# Patient Record
Sex: Female | Born: 1950
Health system: Southern US, Community
[De-identification: ages and names within clinical notes are randomized; demographics above are authoritative.]

## PROBLEM LIST (undated history)

## (undated) DIAGNOSIS — C801 Malignant (primary) neoplasm, unspecified: Secondary | ICD-10-CM

## (undated) DIAGNOSIS — R519 Headache, unspecified: Secondary | ICD-10-CM

## (undated) DIAGNOSIS — IMO0001 Reserved for inherently not codable concepts without codable children: Secondary | ICD-10-CM

## (undated) DIAGNOSIS — C3490 Malignant neoplasm of unspecified part of unspecified bronchus or lung: Secondary | ICD-10-CM

## (undated) DIAGNOSIS — IMO0002 Reserved for concepts with insufficient information to code with codable children: Secondary | ICD-10-CM

## (undated) DIAGNOSIS — R51 Headache: Secondary | ICD-10-CM

## (undated) DIAGNOSIS — Z7189 Other specified counseling: Secondary | ICD-10-CM

## (undated) DIAGNOSIS — D500 Iron deficiency anemia secondary to blood loss (chronic): Secondary | ICD-10-CM

## (undated) DIAGNOSIS — K219 Gastro-esophageal reflux disease without esophagitis: Secondary | ICD-10-CM

## (undated) DIAGNOSIS — J910 Malignant pleural effusion: Secondary | ICD-10-CM

## (undated) DIAGNOSIS — I10 Essential (primary) hypertension: Secondary | ICD-10-CM

## (undated) DIAGNOSIS — E538 Deficiency of other specified B group vitamins: Secondary | ICD-10-CM

## (undated) DIAGNOSIS — I639 Cerebral infarction, unspecified: Secondary | ICD-10-CM

## (undated) DIAGNOSIS — K909 Intestinal malabsorption, unspecified: Secondary | ICD-10-CM

## (undated) DIAGNOSIS — E559 Vitamin D deficiency, unspecified: Secondary | ICD-10-CM

## (undated) HISTORY — DX: Malignant neoplasm of unspecified part of unspecified bronchus or lung: C34.90

## (undated) HISTORY — DX: Gastro-esophageal reflux disease without esophagitis: K21.9

## (undated) HISTORY — PX: MEDIASTINOSCOPY: SUR861

## (undated) HISTORY — PX: APPENDECTOMY: SHX54

## (undated) HISTORY — DX: Malignant pleural effusion: J91.0

## (undated) HISTORY — DX: Reserved for concepts with insufficient information to code with codable children: IMO0002

## (undated) HISTORY — PX: LUNG REMOVAL, PARTIAL: SHX233

## (undated) HISTORY — DX: Vitamin D deficiency, unspecified: E55.9

## (undated) HISTORY — DX: Intestinal malabsorption, unspecified: K90.9

## (undated) HISTORY — PX: BRONCHOSCOPY: SUR163

## (undated) HISTORY — DX: Other specified counseling: Z71.89

## (undated) HISTORY — DX: Deficiency of other specified B group vitamins: E53.8

## (undated) HISTORY — DX: Cerebral infarction, unspecified: I63.9

## (undated) HISTORY — PX: CHOLECYSTECTOMY: SHX55

## (undated) HISTORY — DX: Reserved for inherently not codable concepts without codable children: IMO0001

## (undated) HISTORY — DX: Iron deficiency anemia secondary to blood loss (chronic): D50.0

---
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
## 1981-02-07 HISTORY — PX: VAGINAL HYSTERECTOMY: SUR661

## 1999-02-06 ENCOUNTER — Emergency Department (HOSPITAL_COMMUNITY): Admission: EM | Admit: 1999-02-06 | Discharge: 1999-02-06 | Payer: Self-pay | Admitting: Emergency Medicine

## 1999-02-06 ENCOUNTER — Encounter: Payer: Self-pay | Admitting: Emergency Medicine

## 2000-12-09 ENCOUNTER — Ambulatory Visit (HOSPITAL_COMMUNITY): Admission: RE | Admit: 2000-12-09 | Discharge: 2000-12-09 | Payer: Self-pay | Admitting: *Deleted

## 2003-09-24 ENCOUNTER — Ambulatory Visit (HOSPITAL_COMMUNITY): Admission: RE | Admit: 2003-09-24 | Discharge: 2003-09-24 | Payer: Self-pay | Admitting: Specialist

## 2003-11-03 ENCOUNTER — Ambulatory Visit (HOSPITAL_COMMUNITY): Admission: RE | Admit: 2003-11-03 | Discharge: 2003-11-03 | Payer: Self-pay | Admitting: Cardiothoracic Surgery

## 2003-11-12 ENCOUNTER — Encounter (INDEPENDENT_AMBULATORY_CARE_PROVIDER_SITE_OTHER): Payer: Self-pay | Admitting: Specialist

## 2003-11-12 ENCOUNTER — Inpatient Hospital Stay (HOSPITAL_COMMUNITY): Admission: RE | Admit: 2003-11-12 | Discharge: 2003-11-18 | Payer: Self-pay | Admitting: Cardiothoracic Surgery

## 2003-12-05 ENCOUNTER — Ambulatory Visit (HOSPITAL_COMMUNITY): Admission: RE | Admit: 2003-12-05 | Discharge: 2003-12-05 | Payer: Self-pay | Admitting: Cardiothoracic Surgery

## 2003-12-08 ENCOUNTER — Encounter
Admission: RE | Admit: 2003-12-08 | Discharge: 2003-12-08 | Payer: Self-pay | Admitting: Thoracic Surgery (Cardiothoracic Vascular Surgery)

## 2003-12-10 ENCOUNTER — Ambulatory Visit: Payer: Self-pay | Admitting: Hematology & Oncology

## 2003-12-13 ENCOUNTER — Emergency Department (HOSPITAL_COMMUNITY): Admission: EM | Admit: 2003-12-13 | Discharge: 2003-12-14 | Payer: Self-pay | Admitting: Emergency Medicine

## 2004-01-08 ENCOUNTER — Encounter: Admission: RE | Admit: 2004-01-08 | Discharge: 2004-01-08 | Payer: Self-pay | Admitting: Cardiothoracic Surgery

## 2004-01-28 ENCOUNTER — Ambulatory Visit: Payer: Self-pay | Admitting: Hematology & Oncology

## 2004-02-13 ENCOUNTER — Ambulatory Visit (HOSPITAL_COMMUNITY): Admission: RE | Admit: 2004-02-13 | Discharge: 2004-02-13 | Payer: Self-pay | Admitting: Hematology & Oncology

## 2004-03-24 ENCOUNTER — Ambulatory Visit (HOSPITAL_COMMUNITY): Admission: RE | Admit: 2004-03-24 | Discharge: 2004-03-24 | Payer: Self-pay | Admitting: Hematology & Oncology

## 2004-03-30 ENCOUNTER — Ambulatory Visit: Payer: Self-pay | Admitting: Hematology & Oncology

## 2004-04-01 ENCOUNTER — Ambulatory Visit (HOSPITAL_COMMUNITY): Admission: RE | Admit: 2004-04-01 | Discharge: 2004-04-01 | Payer: Self-pay | Admitting: Hematology & Oncology

## 2004-04-24 ENCOUNTER — Emergency Department (HOSPITAL_COMMUNITY): Admission: EM | Admit: 2004-04-24 | Discharge: 2004-04-24 | Payer: Self-pay | Admitting: Emergency Medicine

## 2004-05-20 ENCOUNTER — Ambulatory Visit (HOSPITAL_COMMUNITY): Admission: RE | Admit: 2004-05-20 | Discharge: 2004-05-20 | Payer: Self-pay | Admitting: Hematology & Oncology

## 2004-05-25 ENCOUNTER — Ambulatory Visit: Payer: Self-pay | Admitting: Hematology & Oncology

## 2004-08-12 ENCOUNTER — Ambulatory Visit (HOSPITAL_COMMUNITY): Admission: RE | Admit: 2004-08-12 | Discharge: 2004-08-12 | Payer: Self-pay | Admitting: Hematology & Oncology

## 2004-08-18 ENCOUNTER — Ambulatory Visit: Payer: Self-pay | Admitting: Hematology & Oncology

## 2004-11-10 ENCOUNTER — Ambulatory Visit: Payer: Self-pay | Admitting: Hematology & Oncology

## 2004-11-11 ENCOUNTER — Ambulatory Visit (HOSPITAL_COMMUNITY): Admission: RE | Admit: 2004-11-11 | Discharge: 2004-11-11 | Payer: Self-pay | Admitting: Hematology & Oncology

## 2005-02-03 ENCOUNTER — Ambulatory Visit: Payer: Self-pay | Admitting: Hematology & Oncology

## 2005-03-15 ENCOUNTER — Ambulatory Visit (HOSPITAL_COMMUNITY): Admission: RE | Admit: 2005-03-15 | Discharge: 2005-03-15 | Payer: Self-pay | Admitting: Hematology & Oncology

## 2005-04-05 ENCOUNTER — Ambulatory Visit (HOSPITAL_COMMUNITY): Admission: RE | Admit: 2005-04-05 | Discharge: 2005-04-05 | Payer: Self-pay | Admitting: Cardiothoracic Surgery

## 2005-06-07 ENCOUNTER — Ambulatory Visit: Payer: Self-pay | Admitting: Hematology & Oncology

## 2005-06-13 LAB — CBC WITH DIFFERENTIAL/PLATELET
BASO%: 0.7 % (ref 0.0–2.0)
HCT: 37 % (ref 34.8–46.6)
MCHC: 33.5 g/dL (ref 32.0–36.0)
MONO#: 0.5 10*3/uL (ref 0.1–0.9)
RBC: 4.57 10*6/uL (ref 3.70–5.32)
RDW: 15.5 % — ABNORMAL HIGH (ref 11.3–14.5)
WBC: 8.3 10*3/uL (ref 3.9–10.0)
lymph#: 3.4 10*3/uL — ABNORMAL HIGH (ref 0.9–3.3)

## 2005-06-13 LAB — COMPREHENSIVE METABOLIC PANEL
ALT: 12 U/L (ref 0–40)
AST: 18 U/L (ref 0–37)
CO2: 25 mEq/L (ref 19–32)
Calcium: 9.2 mg/dL (ref 8.4–10.5)
Chloride: 103 mEq/L (ref 96–112)
Potassium: 3.8 mEq/L (ref 3.5–5.3)
Sodium: 140 mEq/L (ref 135–145)
Total Protein: 7.4 g/dL (ref 6.0–8.3)

## 2005-07-07 ENCOUNTER — Ambulatory Visit (HOSPITAL_COMMUNITY): Admission: RE | Admit: 2005-07-07 | Discharge: 2005-07-07 | Payer: Self-pay | Admitting: Hematology & Oncology

## 2005-07-13 LAB — CBC WITH DIFFERENTIAL/PLATELET
BASO%: 1 % (ref 0.0–2.0)
Basophils Absolute: 0.1 10*3/uL (ref 0.0–0.1)
EOS%: 2.1 % (ref 0.0–7.0)
HCT: 36 % (ref 34.8–46.6)
HGB: 11.9 g/dL (ref 11.6–15.9)
LYMPH%: 39.9 % (ref 14.0–48.0)
MCH: 26.9 pg (ref 26.0–34.0)
MCHC: 33.2 g/dL (ref 32.0–36.0)
MCV: 81.2 fL (ref 81.0–101.0)
MONO%: 6.2 % (ref 0.0–13.0)
NEUT%: 50.8 % (ref 39.6–76.8)
Platelets: 242 10*3/uL (ref 145–400)
lymph#: 3.4 10*3/uL — ABNORMAL HIGH (ref 0.9–3.3)

## 2005-07-13 LAB — COMPREHENSIVE METABOLIC PANEL
ALT: 18 U/L (ref 0–40)
Albumin: 4.4 g/dL (ref 3.5–5.2)
CO2: 27 mEq/L (ref 19–32)
Calcium: 9.4 mg/dL (ref 8.4–10.5)
Chloride: 103 mEq/L (ref 96–112)
Creatinine, Ser: 0.85 mg/dL (ref 0.40–1.20)
Potassium: 3.8 mEq/L (ref 3.5–5.3)

## 2005-09-17 IMAGING — CT CT HEAD WO/W CM
1 of 2 series · 15 of 30 positions shown, 19 images · IV contrast (omnipaque)
Comparison: none

CLINICAL DATA: 53 year old with lung lesion.  History of aneurysm repair in 9889. 
 CT HEAD, WITH AND WITHOUT CONTRAST
 Axial images are acquired through the brain prior to and following administration of 100 cc of Omnipaque 300.   No prior exams are available for comparison.   There is no intra or extra-axial fluid collection or mass. The basilar cisterns and ventricles have a normal appearance.  The patient has had right frontal craniotomy. Following administration of contrast, there are no enhancing lesions.  
 IMPRESSION 
 No CT evidence for acute intracranial abnormality.

[Series 3: head w/ · axial · 0.47mm/px · z∈[+155,+263]mm · 15 of 36 slices shown, 19 images]
[im 3/36  brain]
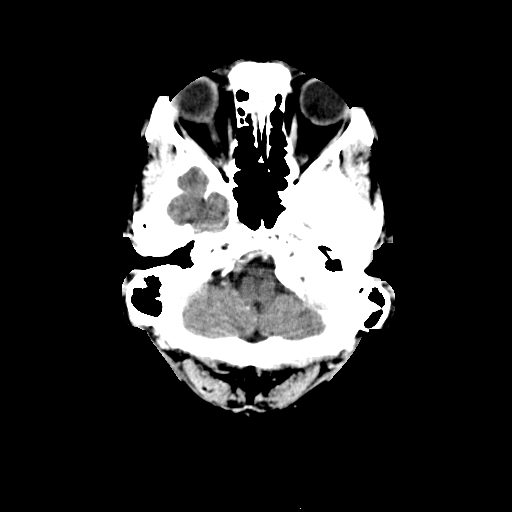
[im 3/36  bone]
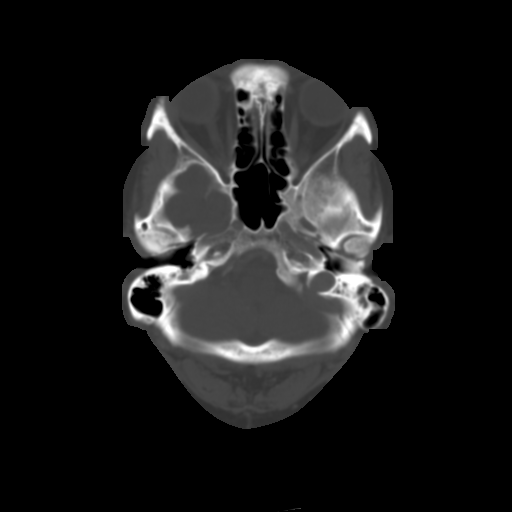
[im 5/36  brain]
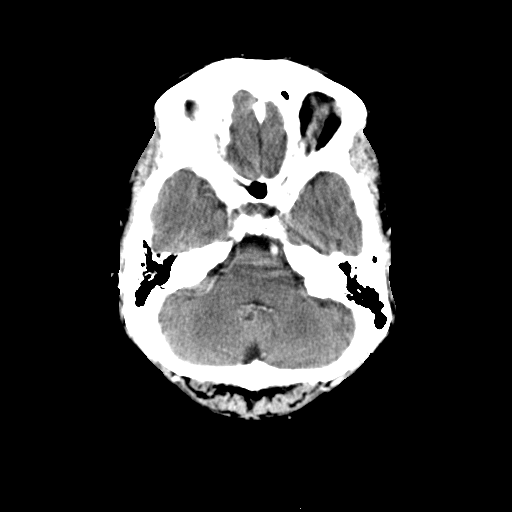
[im 7/36  brain]
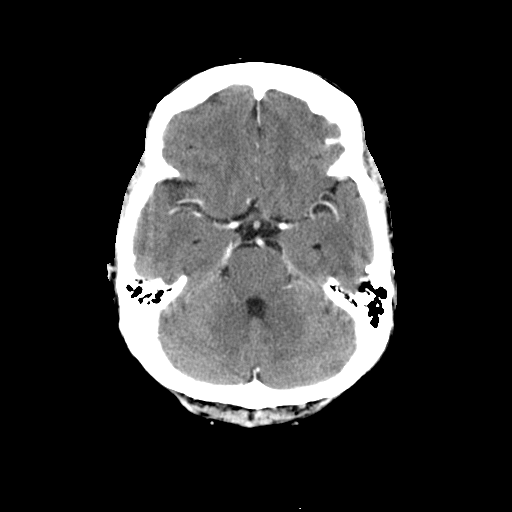
[im 9/36  brain]
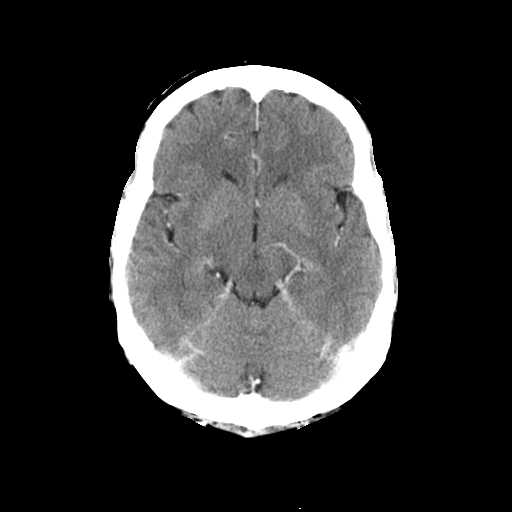
[im 11/36  brain]
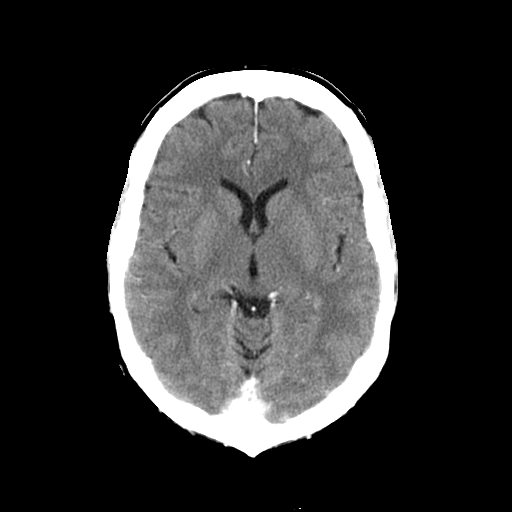
[im 11/36  bone]
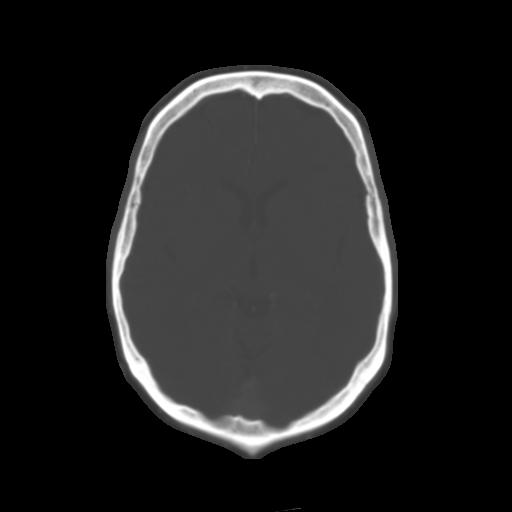
[im 14/36  brain]
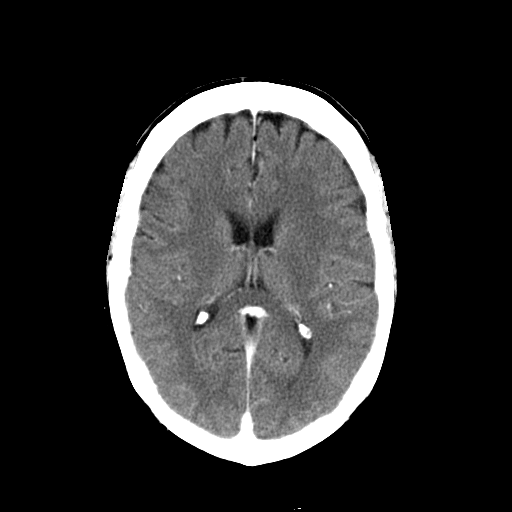
[im 16/36  brain]
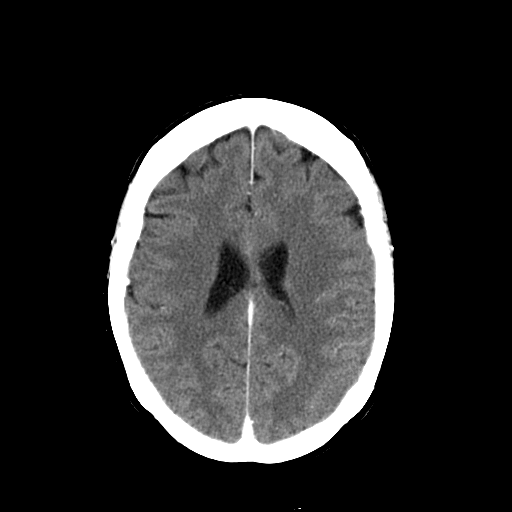
[im 18/36  brain]
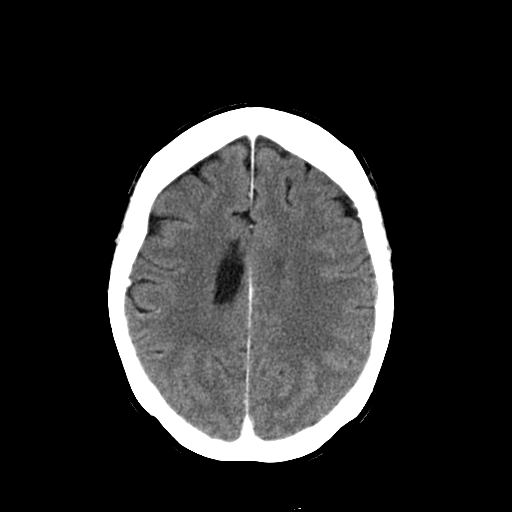
[im 20/36  brain]
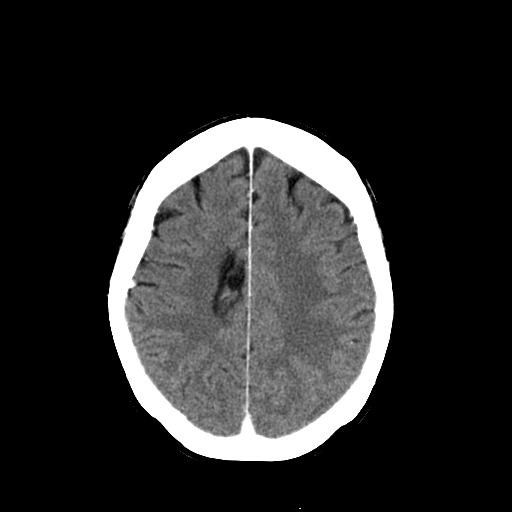
[im 20/36  bone]
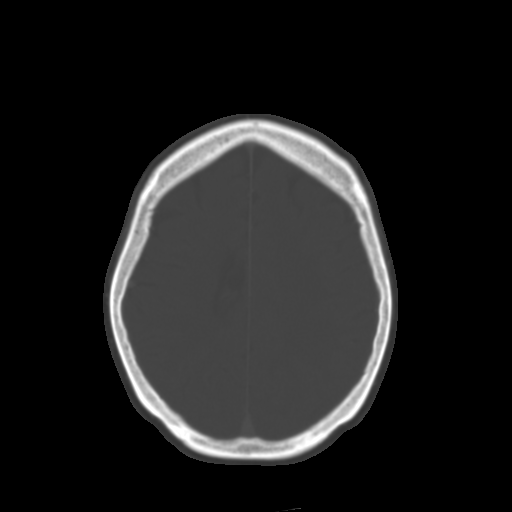
[im 22/36  brain]
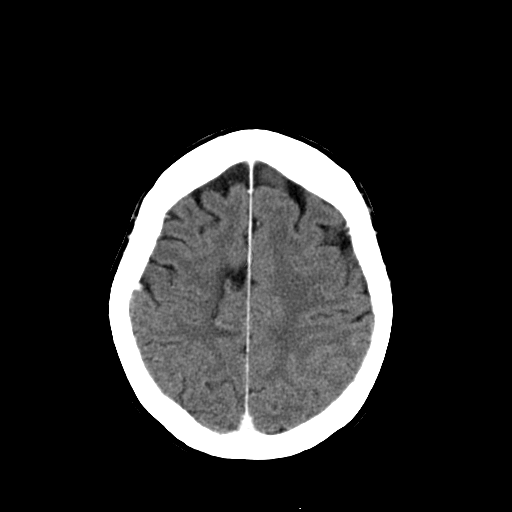
[im 25/36  brain]
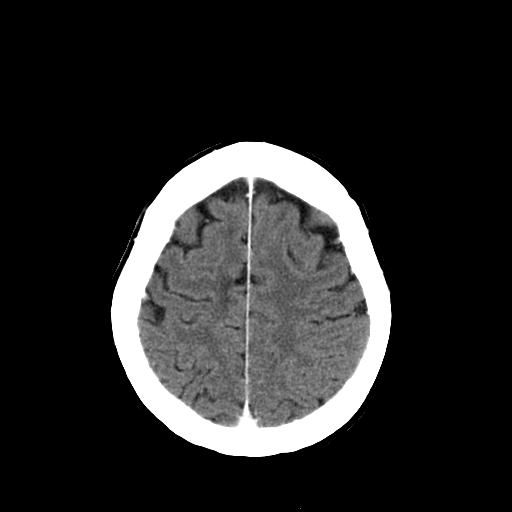
[im 27/36  brain]
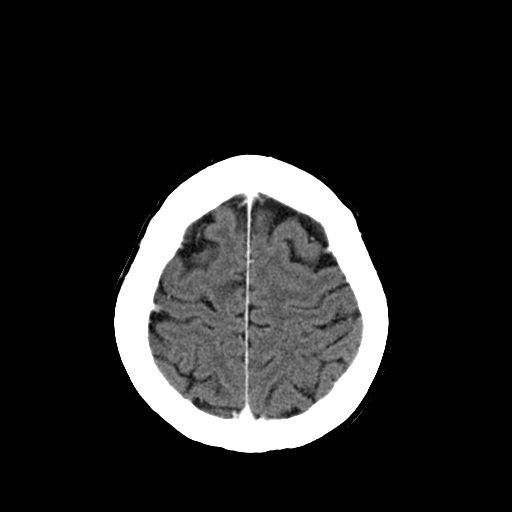
[im 29/36  brain]
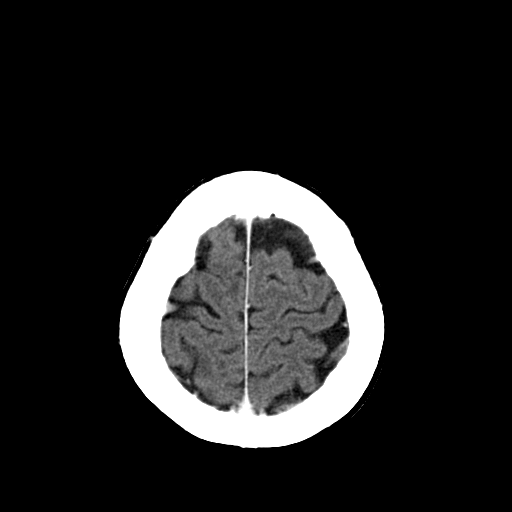
[im 29/36  bone]
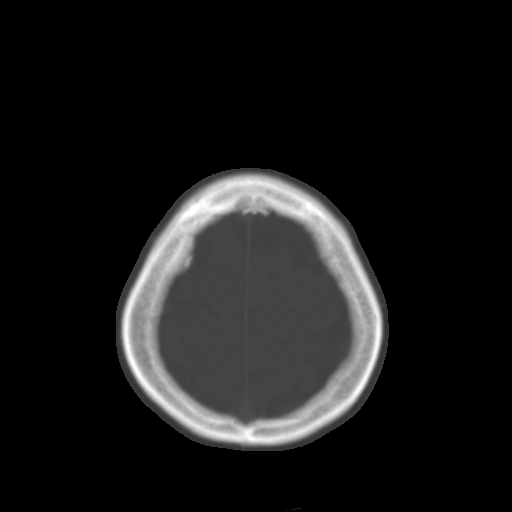
[im 31/36  brain]
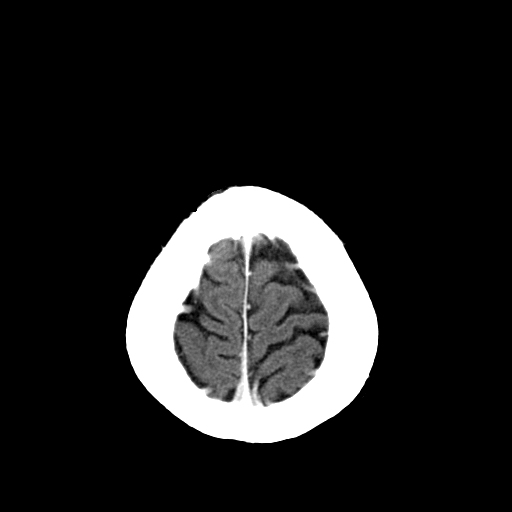
[im 33/36  brain]
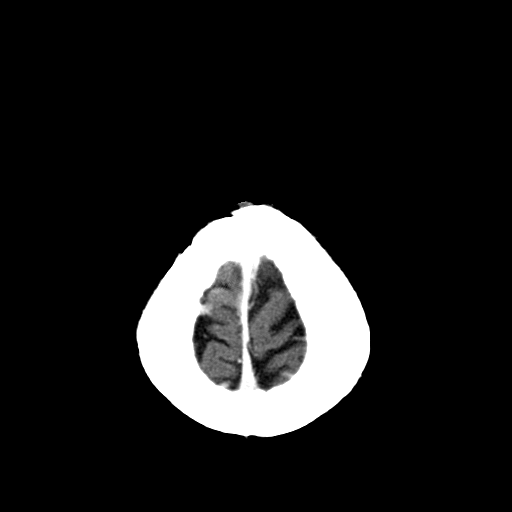

[15 of 30 positions shown; findings below may reference images not displayed]

## 2005-09-24 IMAGING — CR DG CHEST 2V
2 series · 2 of 2 positions shown · non-contrast
Comparison: none

2 views. Heart size is normal. The mediastinum is unremarkable. Left lung appears clear. There may
be a small area of hazy opacity in the right mid chest. No effusions. Ordinary degenerative changes
affect the thoracic spine.

[view not recorded (1 of 2)]
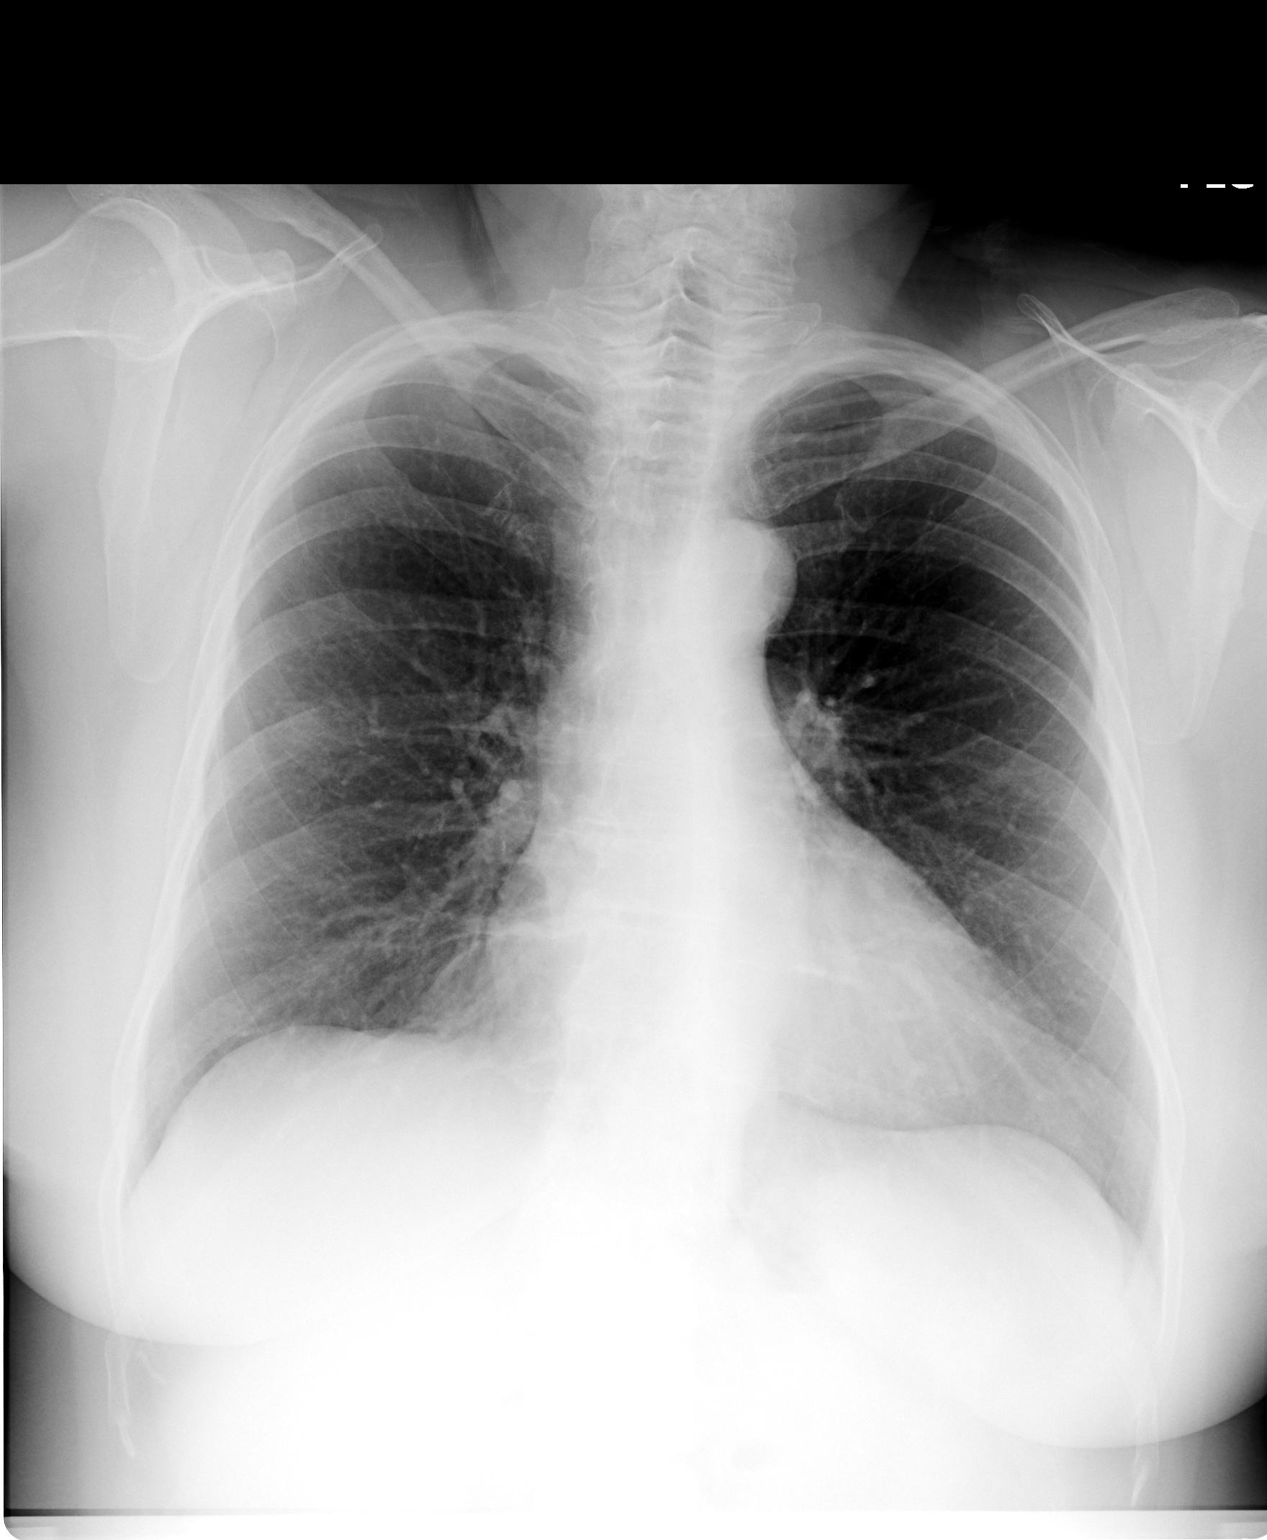

[view not recorded (2 of 2)]
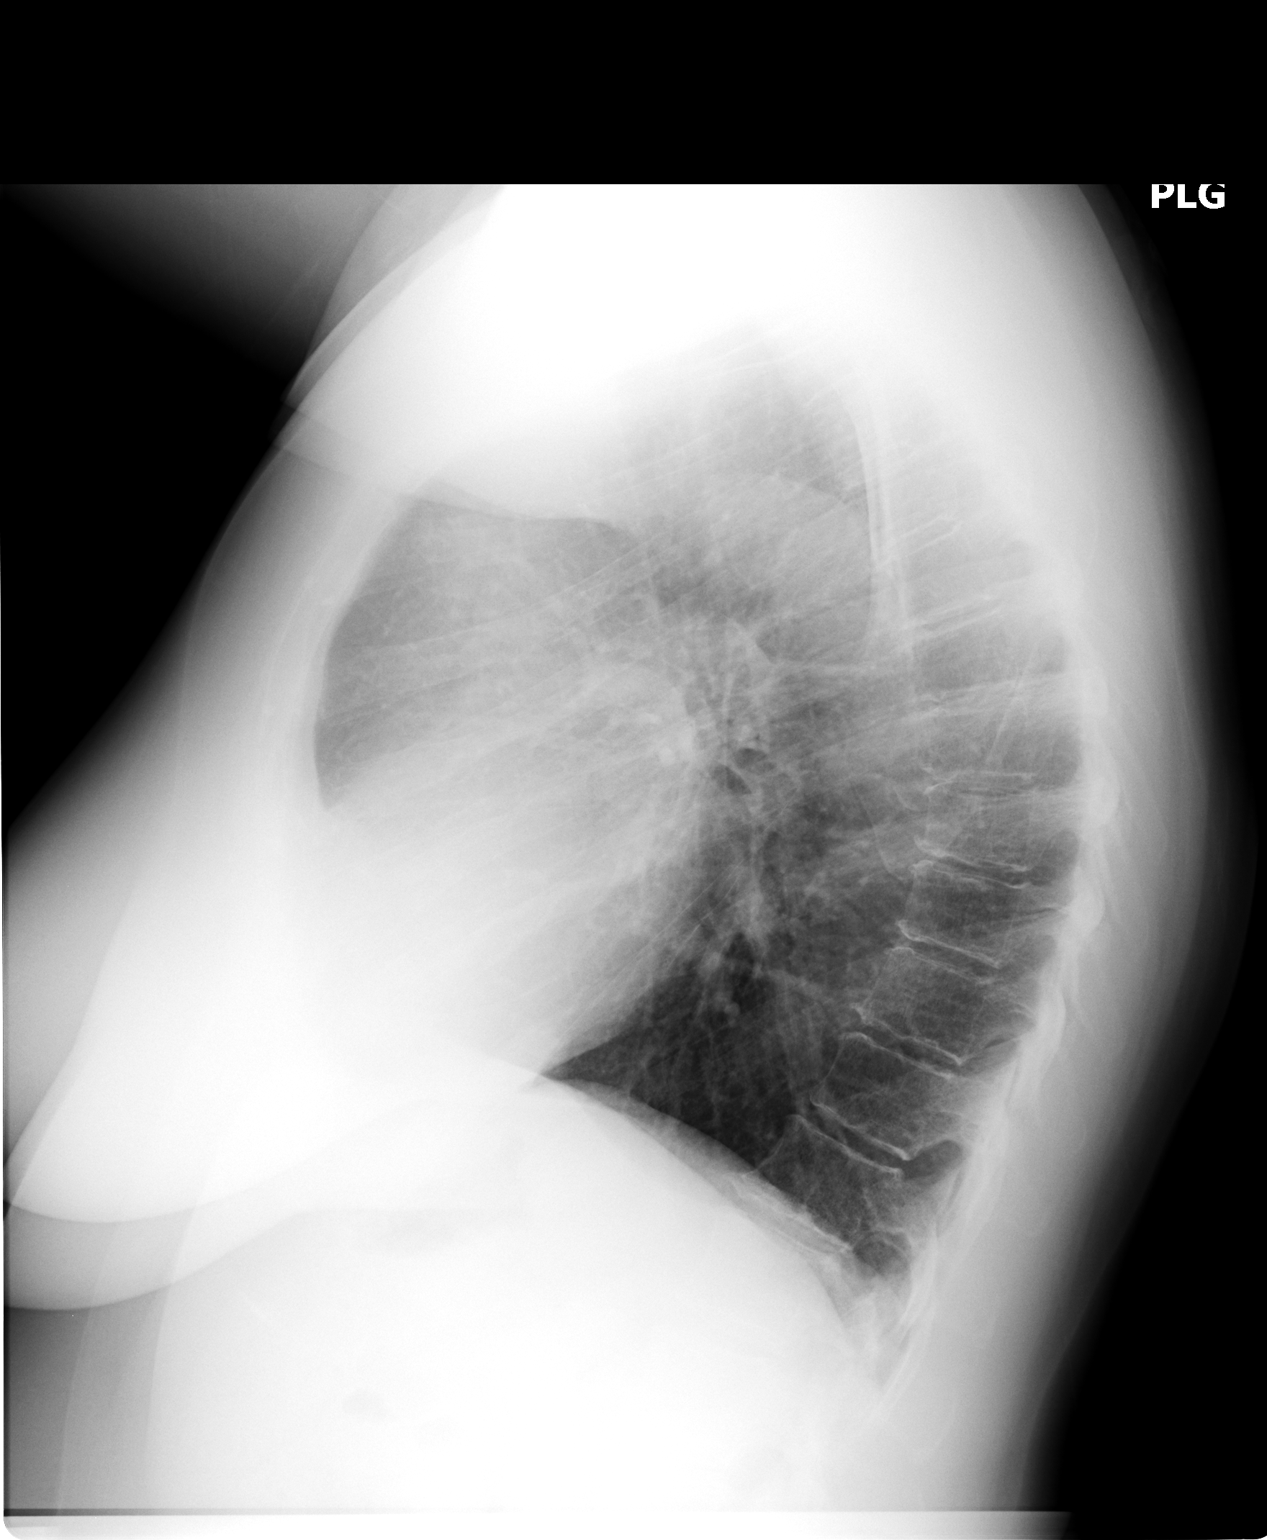

[2 of 2 positions shown; findings below may reference images not displayed]

IMPRESSION: Question hazy opacity in the right mid chest.

## 2005-09-26 IMAGING — CR DG CHEST 1V PORT
1 series · 1 of 1 positions shown · non-contrast
Comparison: none

CLINICAL DATA: Status post right thoracotomy for pulmonary nodule.

[view not recorded]
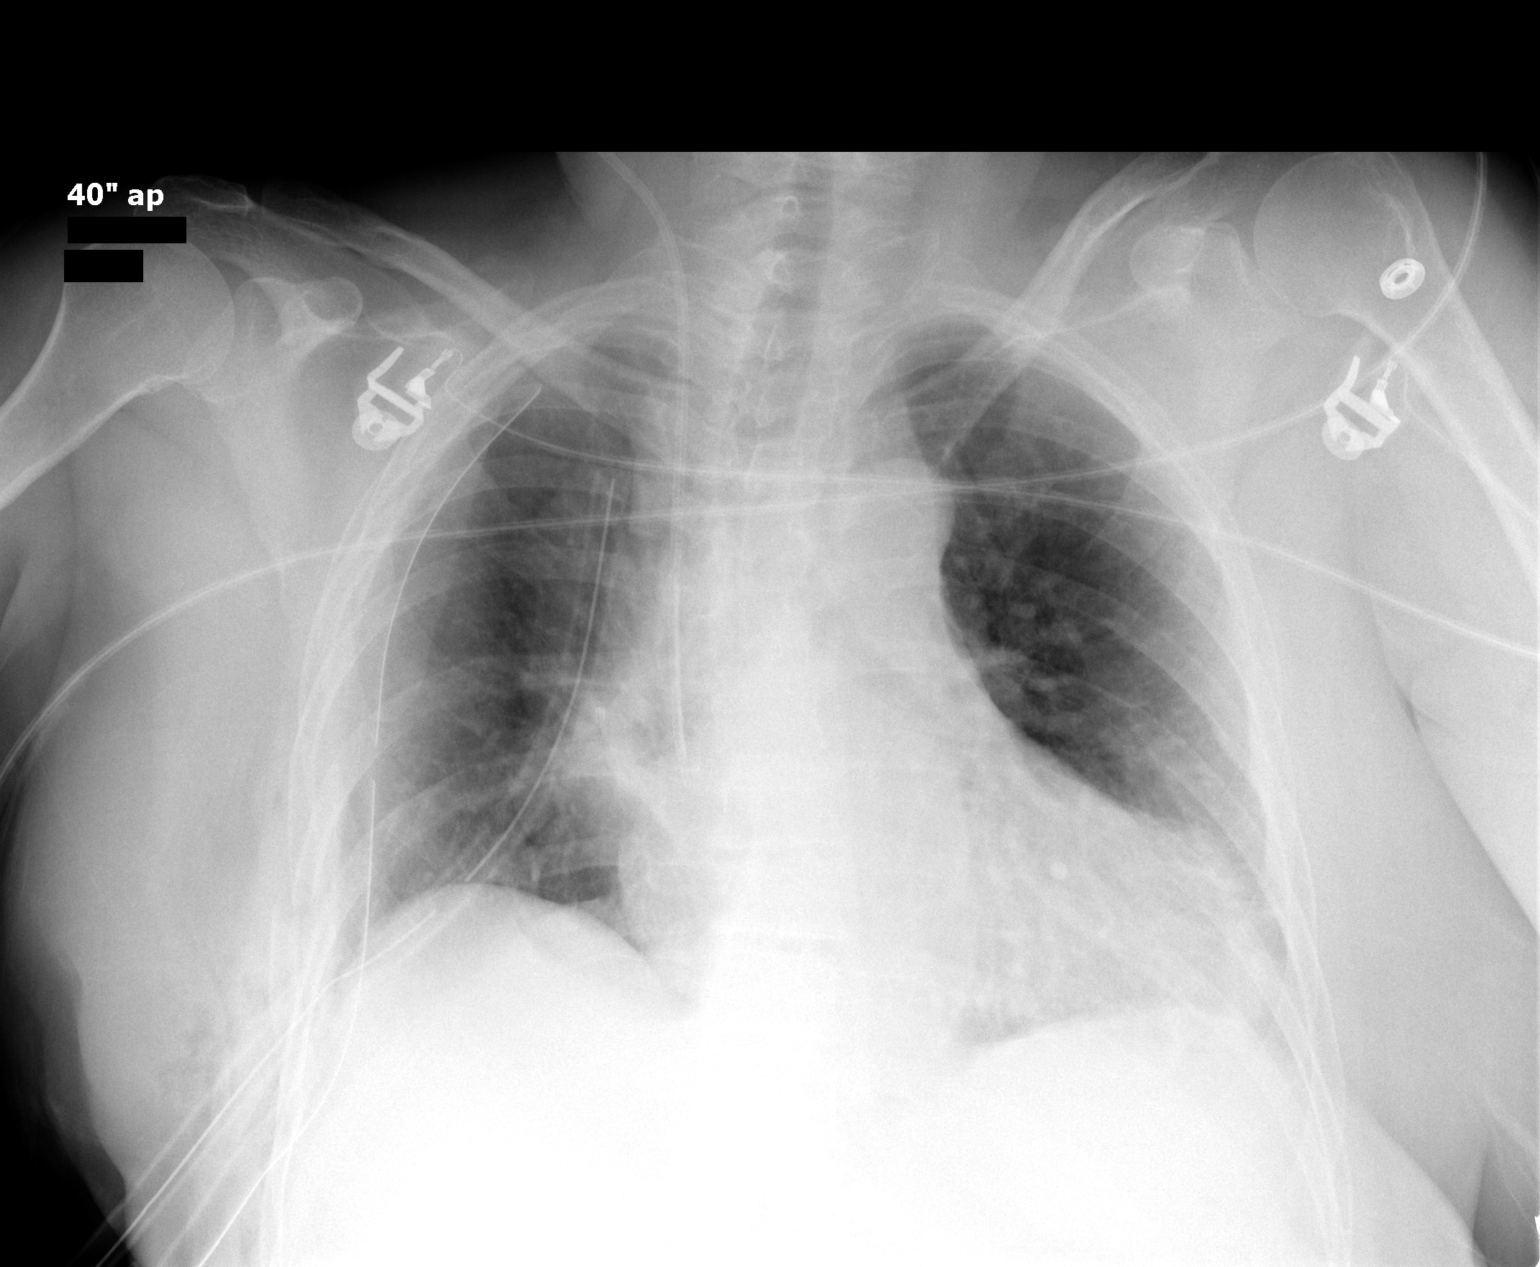

[1 of 1 positions shown; findings below may reference images not displayed]

CHEST, SINGLE VIEW, 4844 HOURS

Postoperative two right chest tubes are in place. Central line tip lies at the cavoatrial junction.
No pneumothorax is seen. The heart size is stable. No edema or pleural effusions.

IMPRESSION

No pneumothorax status post right thoracotomy. Two chest tubes are in place. The central line tip
lies at the cavoatrial junction.

## 2005-09-27 IMAGING — CR DG CHEST 1V PORT
1 series · 1 of 1 positions shown · non-contrast
Comparison: 11/12/03.

CLINICAL DATA: 53-year-old female, right lung lesion, status-post VATS. 
 PORTABLE SINGLE VIEW CHEST RADIOGRAPH, 11/13/03

[view not recorded]
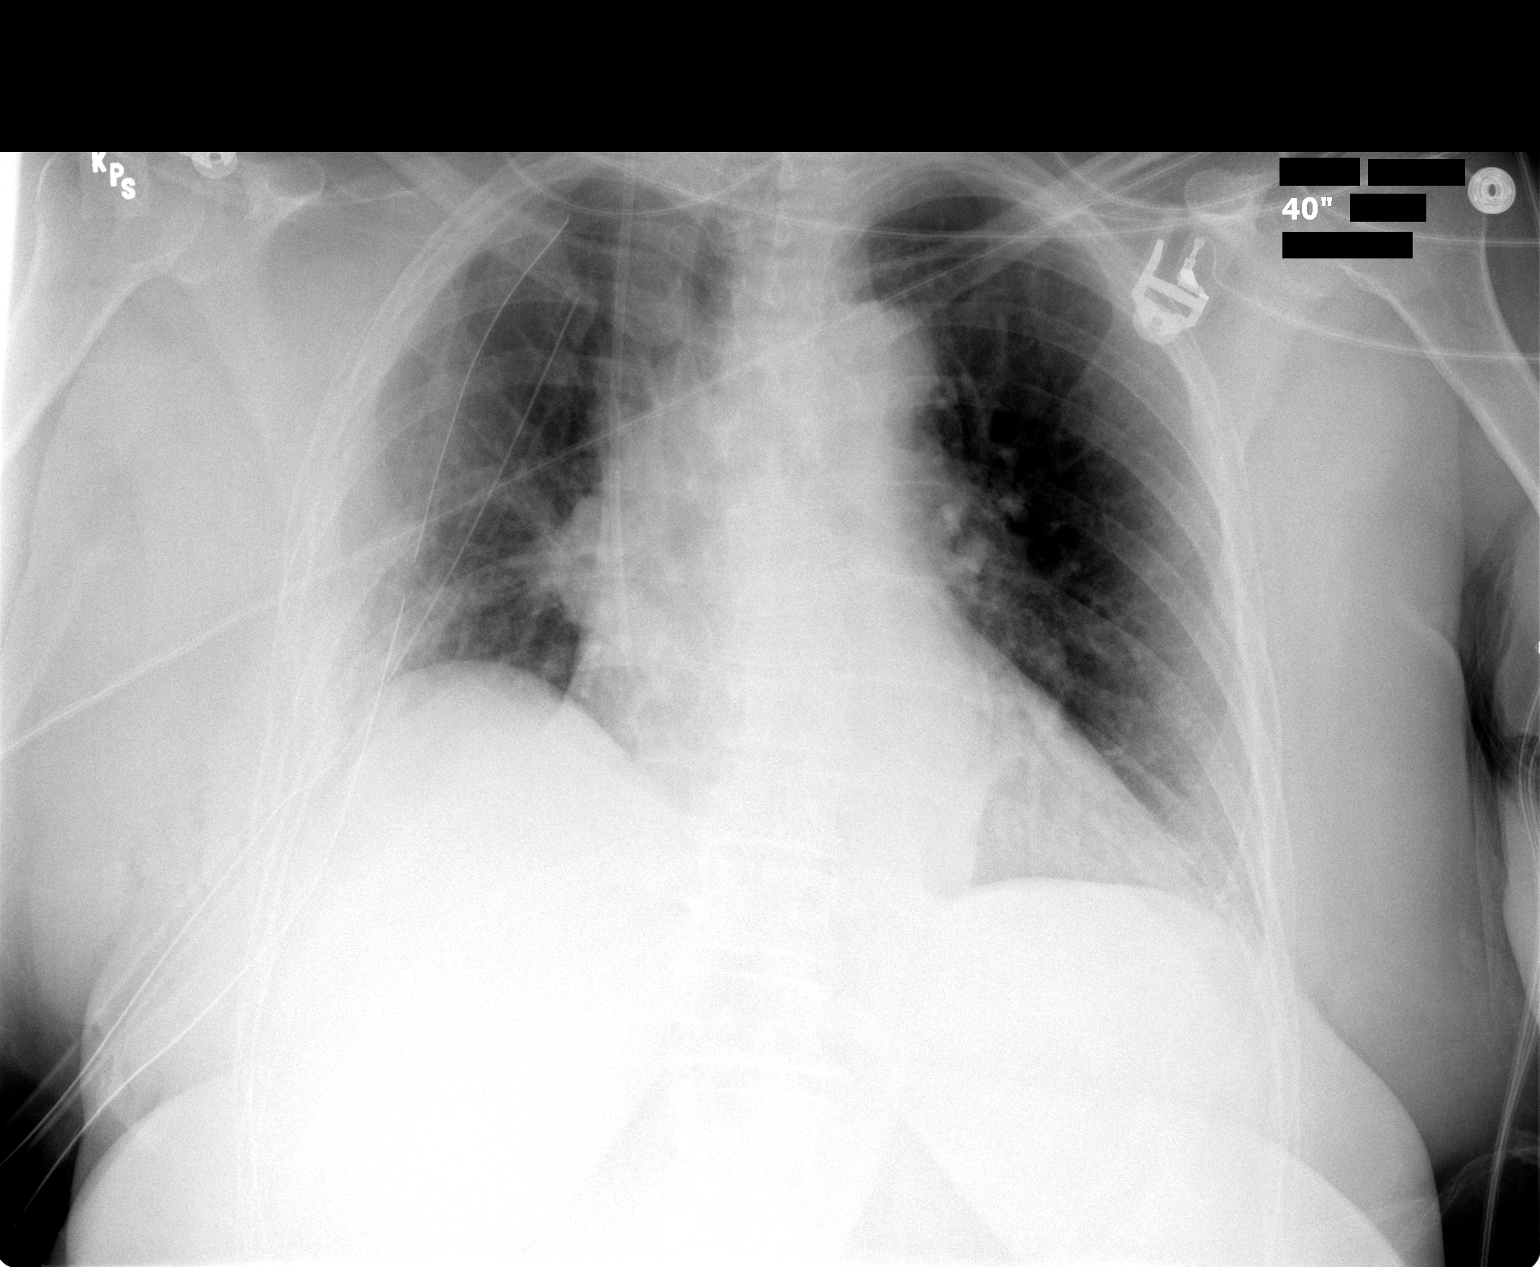

[1 of 1 positions shown; findings below may reference images not displayed]

FINDINGS: Right IJ central line tip is in the SVC RA junction.  Two right chest tubes remain.  No pneumothorax.  Right lower lobe atelectasis persists with an elevated hemidiaphragm.  
 IMPRESSION
 No pneumothorax.  
 Right lower lobe atelectasis.

## 2005-09-28 IMAGING — CR DG CHEST 1V PORT
1 series · 1 of 1 positions shown · non-contrast
Comparison: 11/13/2003.

CLINICAL DATA: Right lung lesion, chest tubes, thoracotomy. 
 CHEST PORTABLE ONE VIEW, 8298 HOURS

[view not recorded]
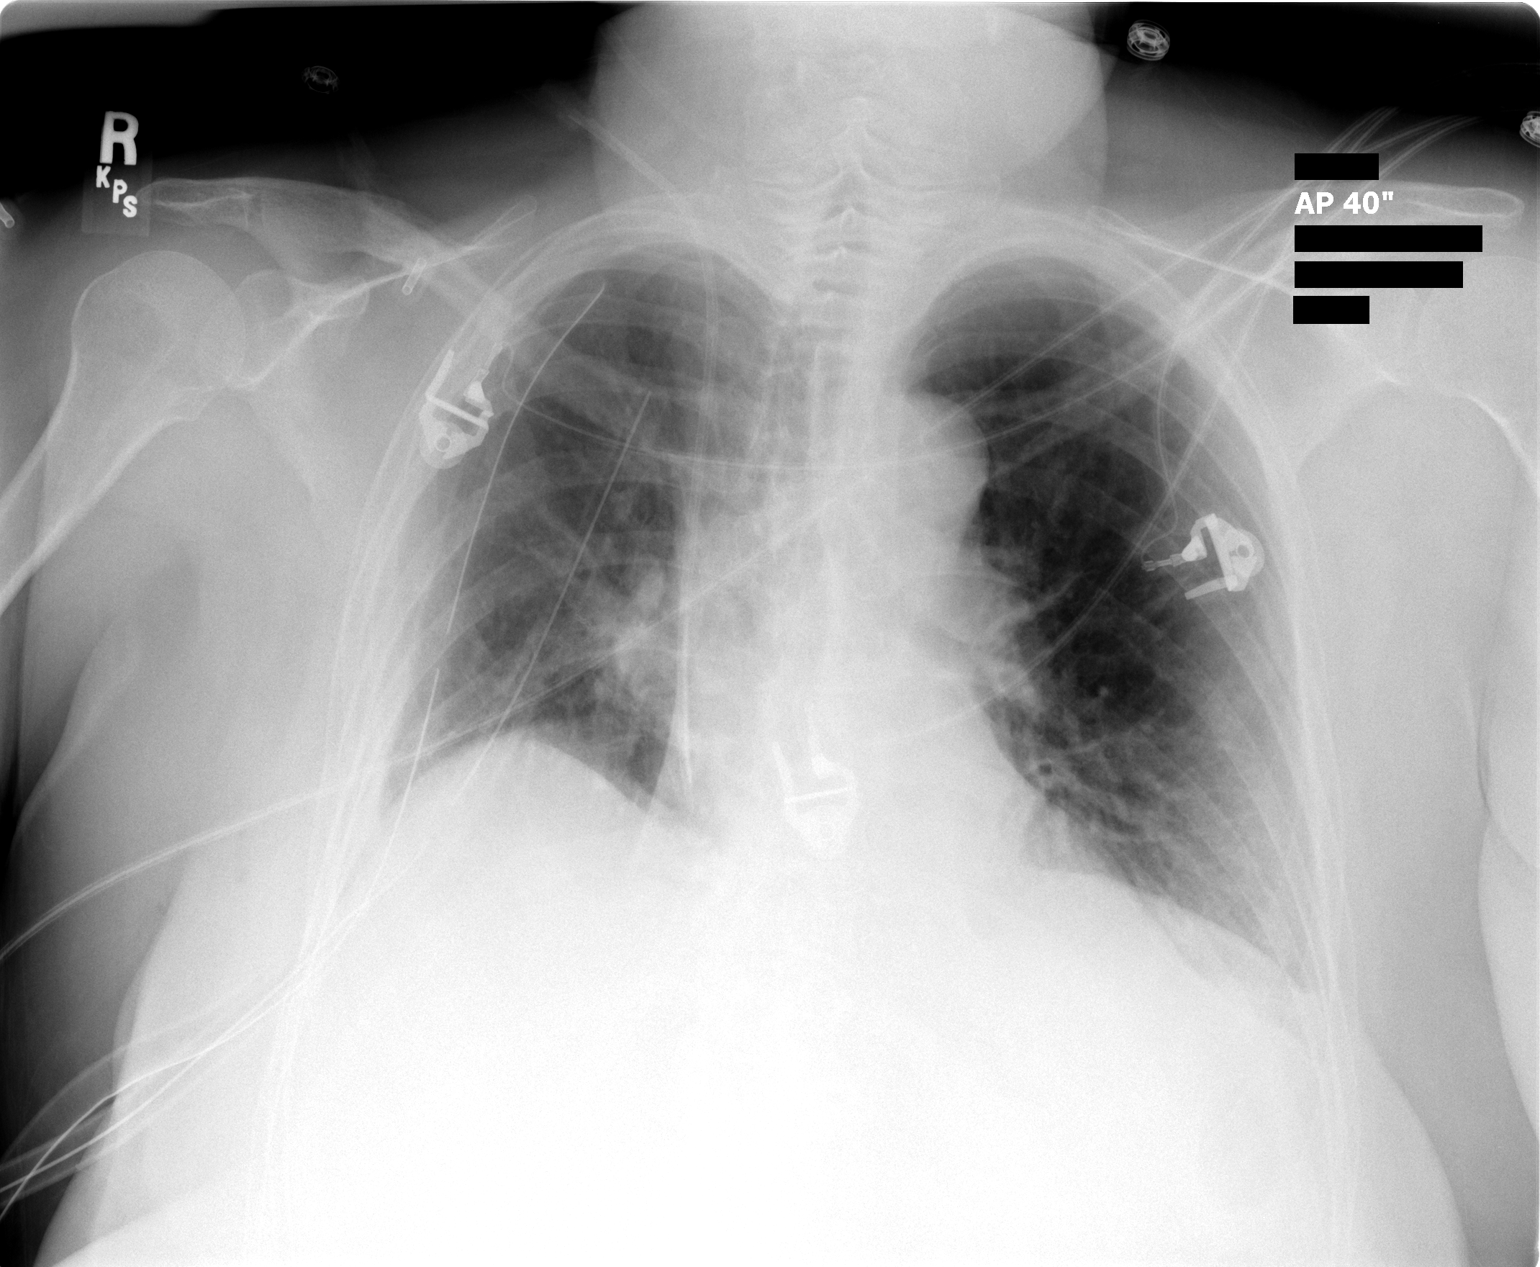

[1 of 1 positions shown; findings below may reference images not displayed]

FINDINGS: Two right chest tubes remain in place. No pneumothorax. Right lower lobe atelectasis again noted. Low lung volumes present. Heart is normal size. 
 IMPRESSION
 Right chest tubes remain in place.  No pneumothorax.

## 2005-09-29 IMAGING — CR DG CHEST 1V PORT
1 series · 1 of 1 positions shown · non-contrast
Comparison: 11/14/03.

CLINICAL DATA: Right-sided chest tube.  Patient taking shallow breaths. 
 CHEST PORTABLE, ONE VIEW 11/15/03 AT 6857 HOURS

[view not recorded]
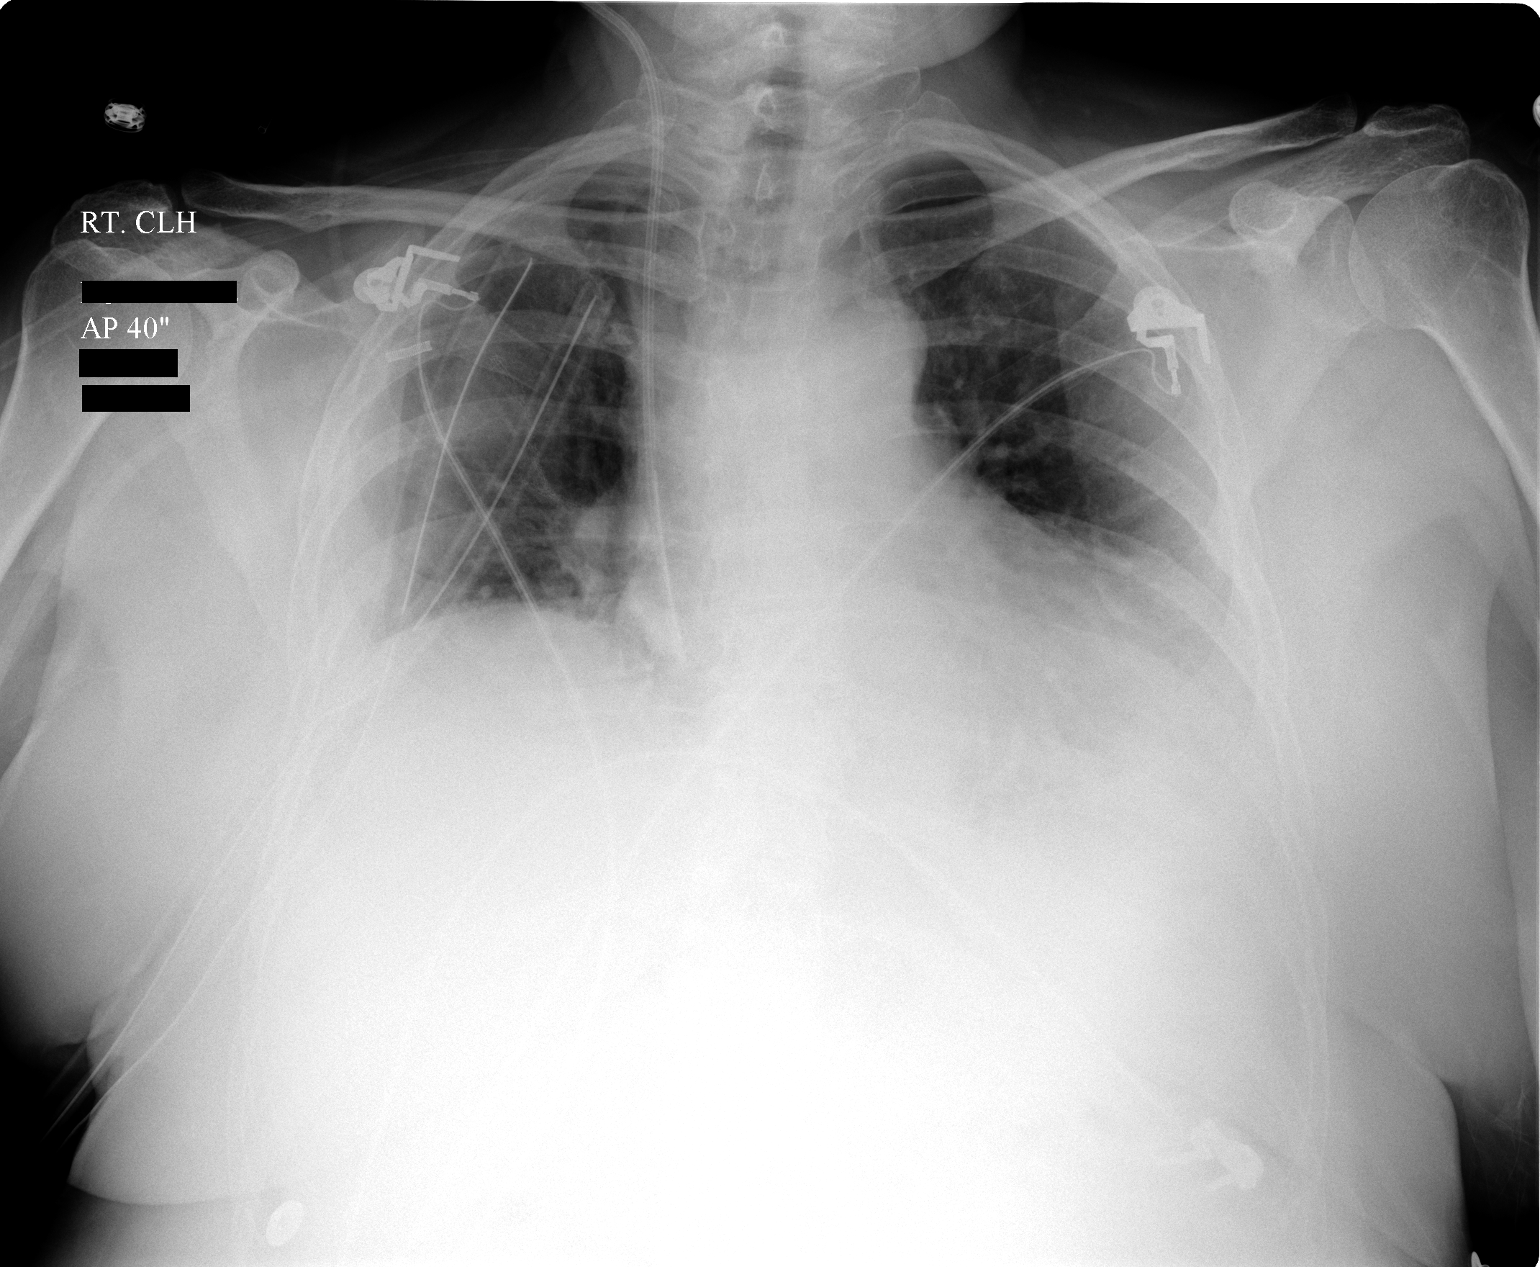

[1 of 1 positions shown; findings below may reference images not displayed]

Two right chest tubes are in place.  Probable tiny right apical pneumothorax.  Very low lung volumes are noted with bibasilar atelectasis, worsening since prior study. 
 IMPRESSION
 1.  Probable tiny right apical pneumothorax.
 2.  Very low lung volumes with bibasilar atelectasis.

## 2005-09-30 IMAGING — CR DG CHEST 2V
2 series · 2 of 2 positions shown · non-contrast
Comparison: Earlier the same day.

CLINICAL DATA: Right lung lesion.  Status post chest tube removal.
 TWO VIEW CHEST   - 11/16/03

[view not recorded (1 of 2)]
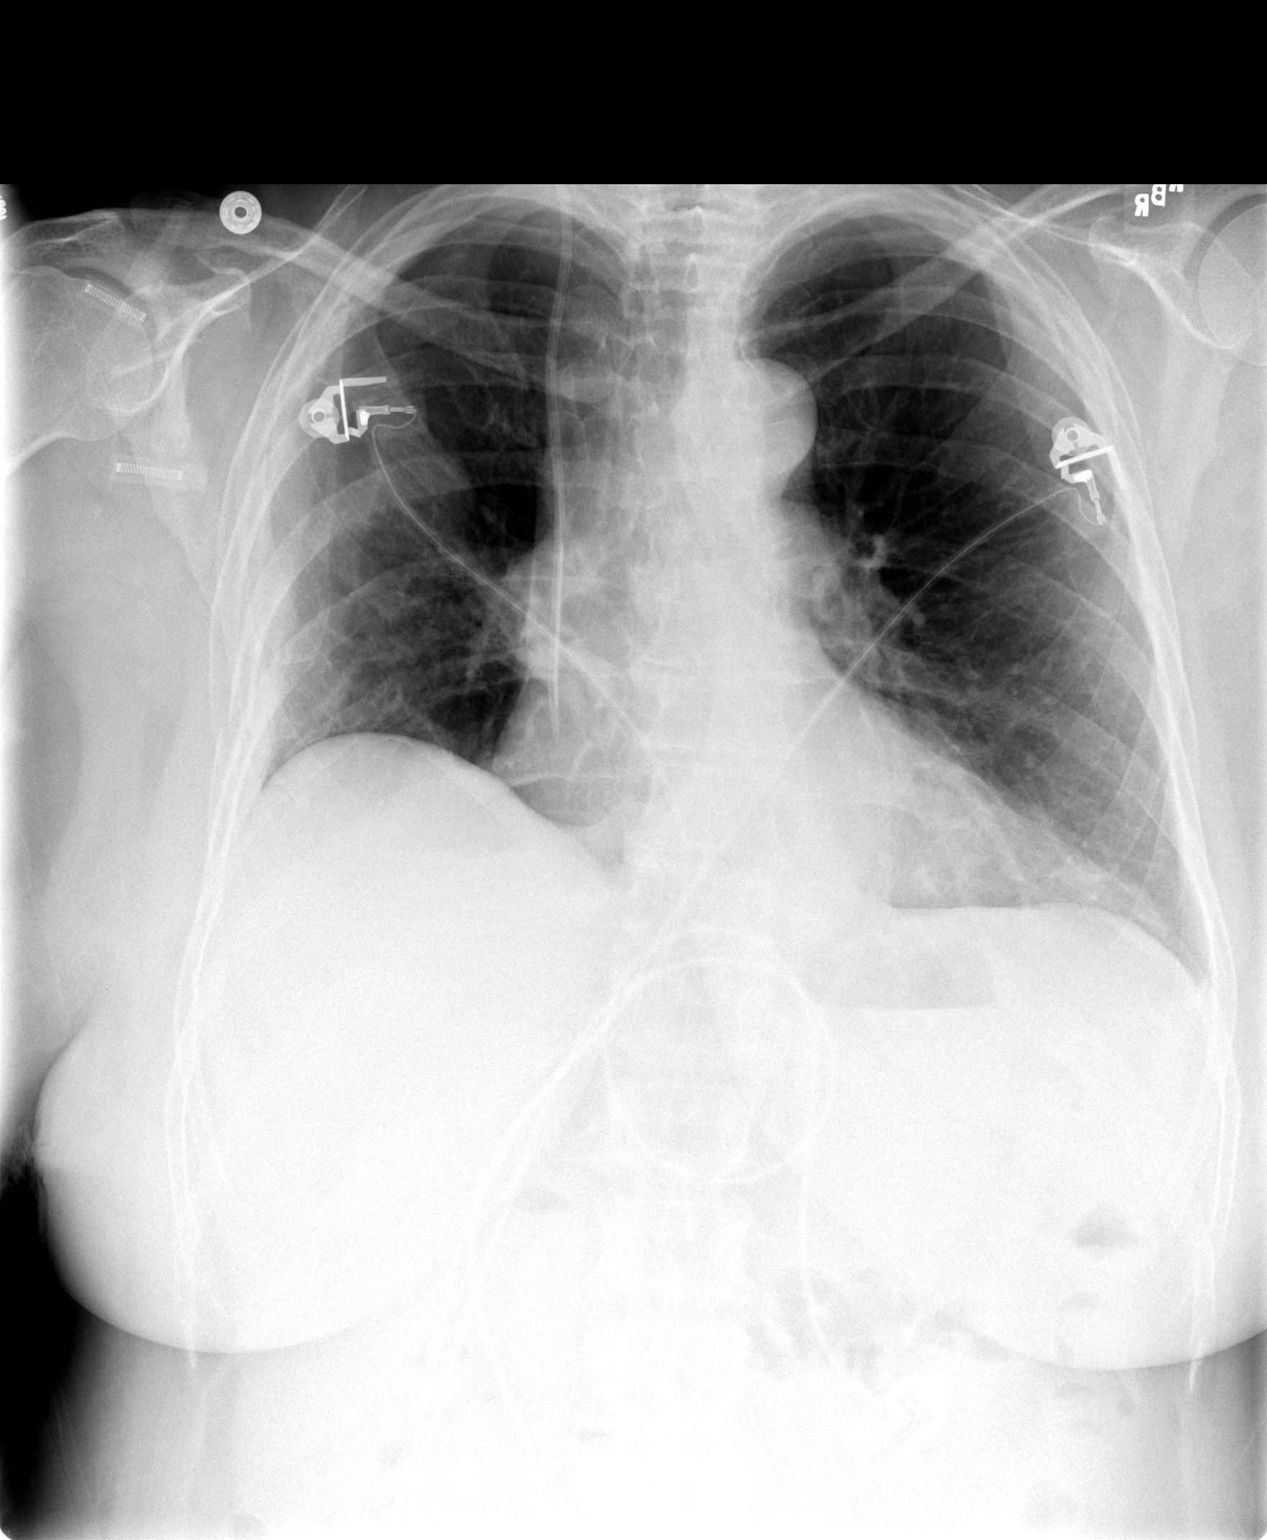

[view not recorded (2 of 2)]
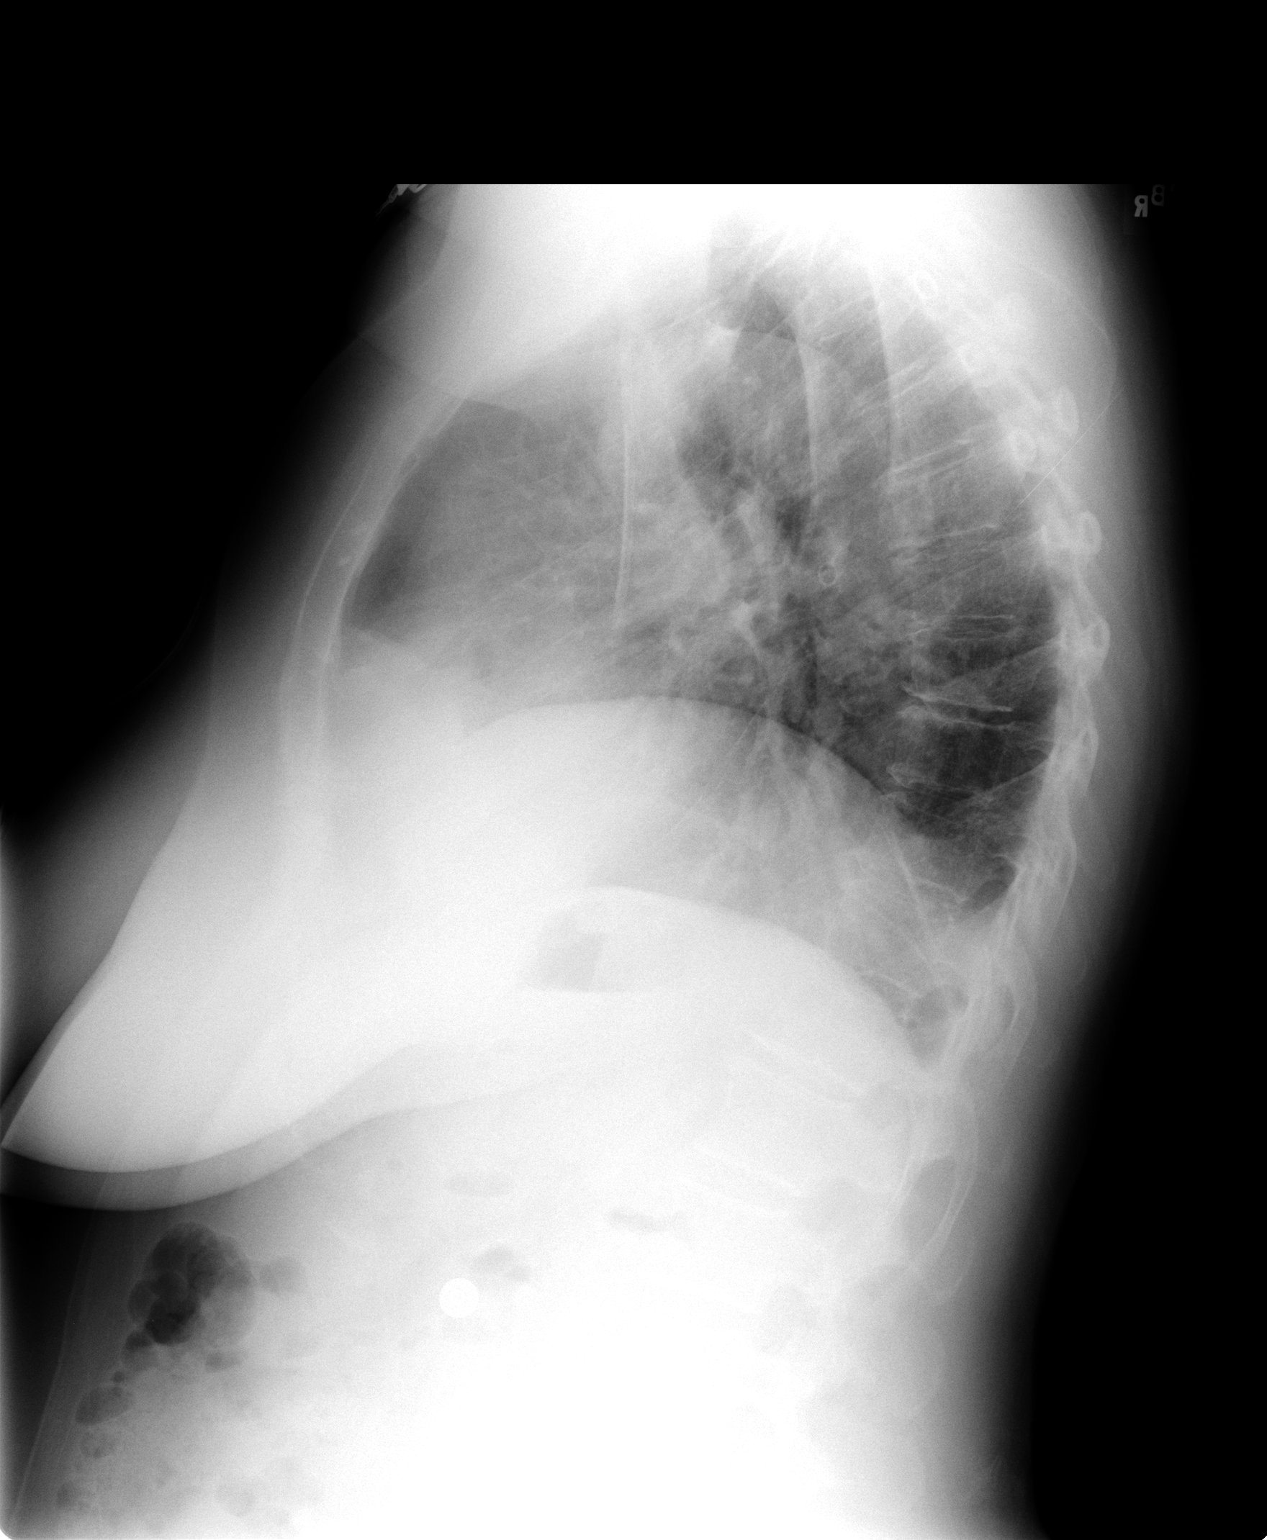

[2 of 2 positions shown; findings below may reference images not displayed]

FINDINGS: Status post interval removal of the right-sided chest tube.  There is a pleural line in the right apex which may be related to a tiny apical pneumothorax.  The central line remains in place.  Heart size is enlarged but stable.
IMPRESSION: Status post right chest tube removal.  Tiny persistent right apical pneumothorax.

## 2005-09-30 IMAGING — CR DG CHEST 1V PORT
1 series · 1 of 1 positions shown · non-contrast
Comparison: 11/15/03.

CLINICAL DATA: Right lung lesion, status post VATS. 
 CHEST PORTABLE ONE VIEW 11/16/03 AT 6966 HOURS

[view not recorded]
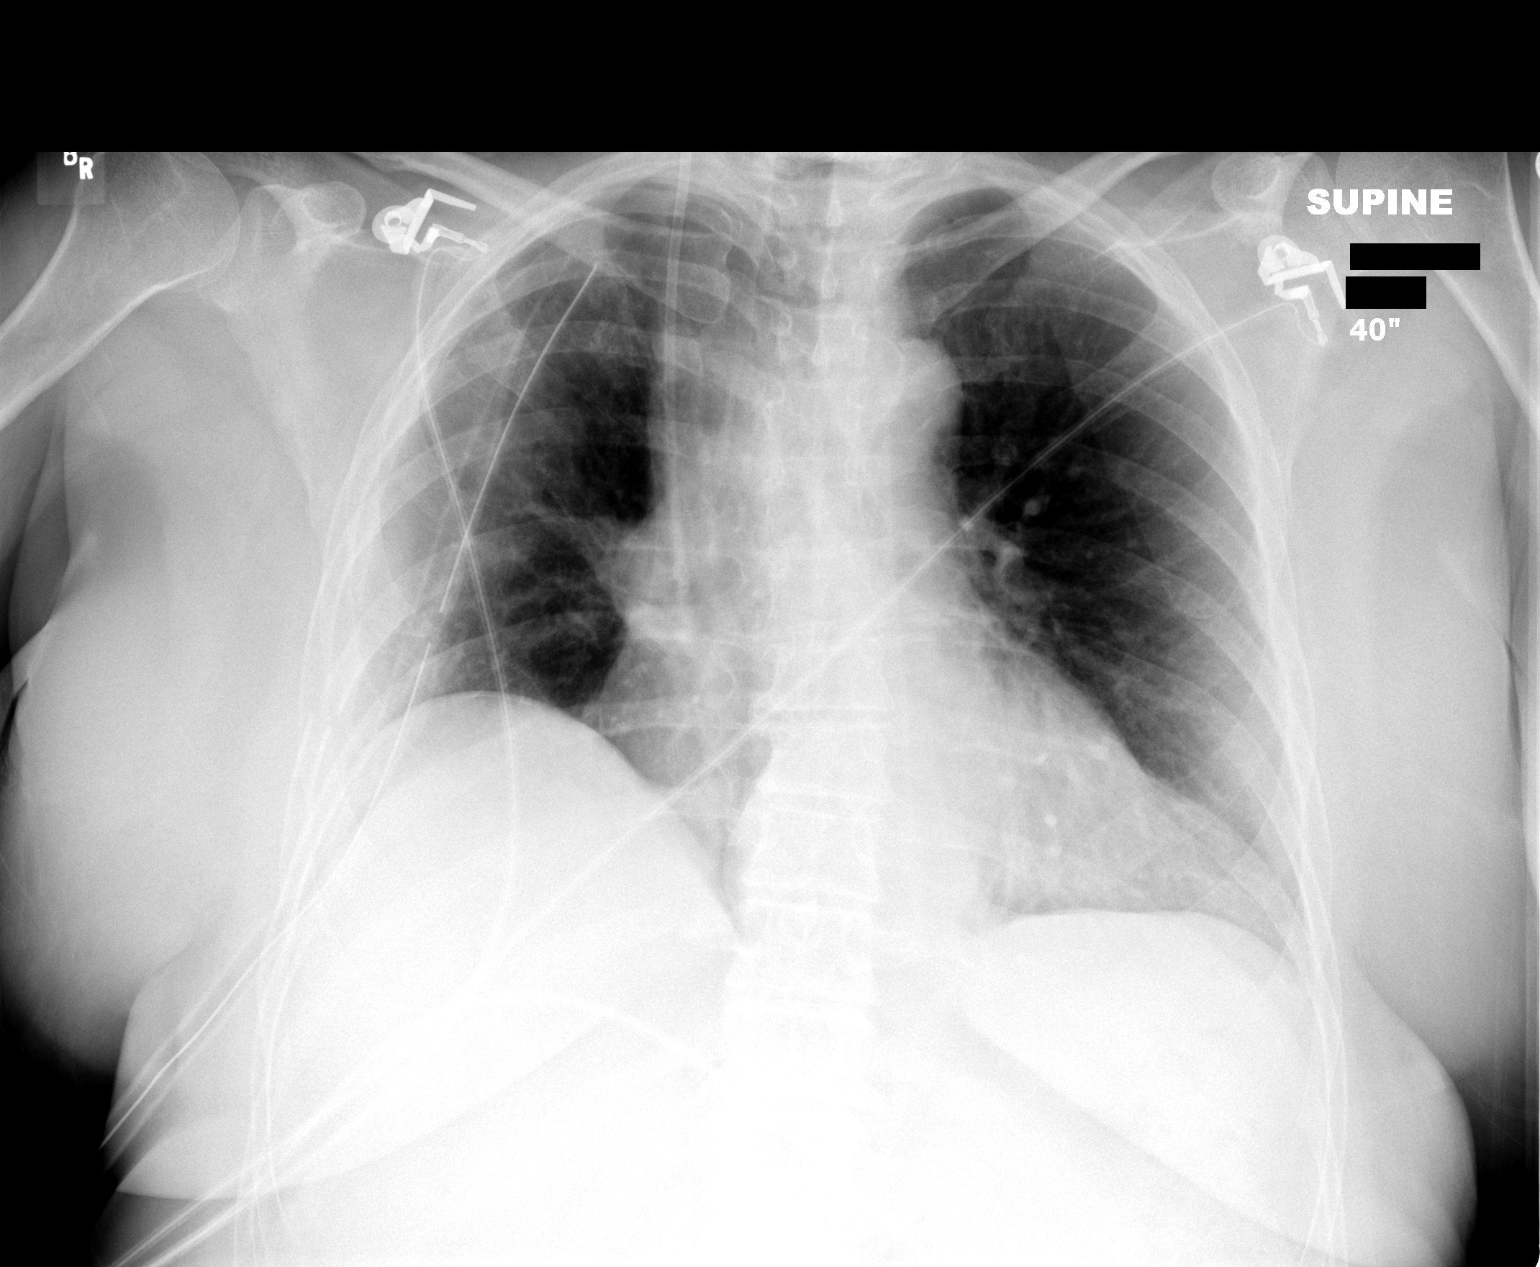

[1 of 1 positions shown; findings below may reference images not displayed]

One of the two right chest tubes has been removed.  Small right apical pneumothorax again seen, less than 5 percent.  There is elevation of the right hemidiaphragm.  Improving aeration in the left base.  
 IMPRESSION
 1.  Interval removal of one of the two right chest tubes with small right pneumothorax, less than 5 percent. 
 2.  Improved aeration in the left base.

## 2005-10-01 IMAGING — CR DG CHEST 2V
2 series · 2 of 2 positions shown · non-contrast
Comparison: 11/16/2003.

HISTORY: Right lung lesion status post VATS, chest tube removal

CHEST 2 VIEWS

[view not recorded (1 of 2)]
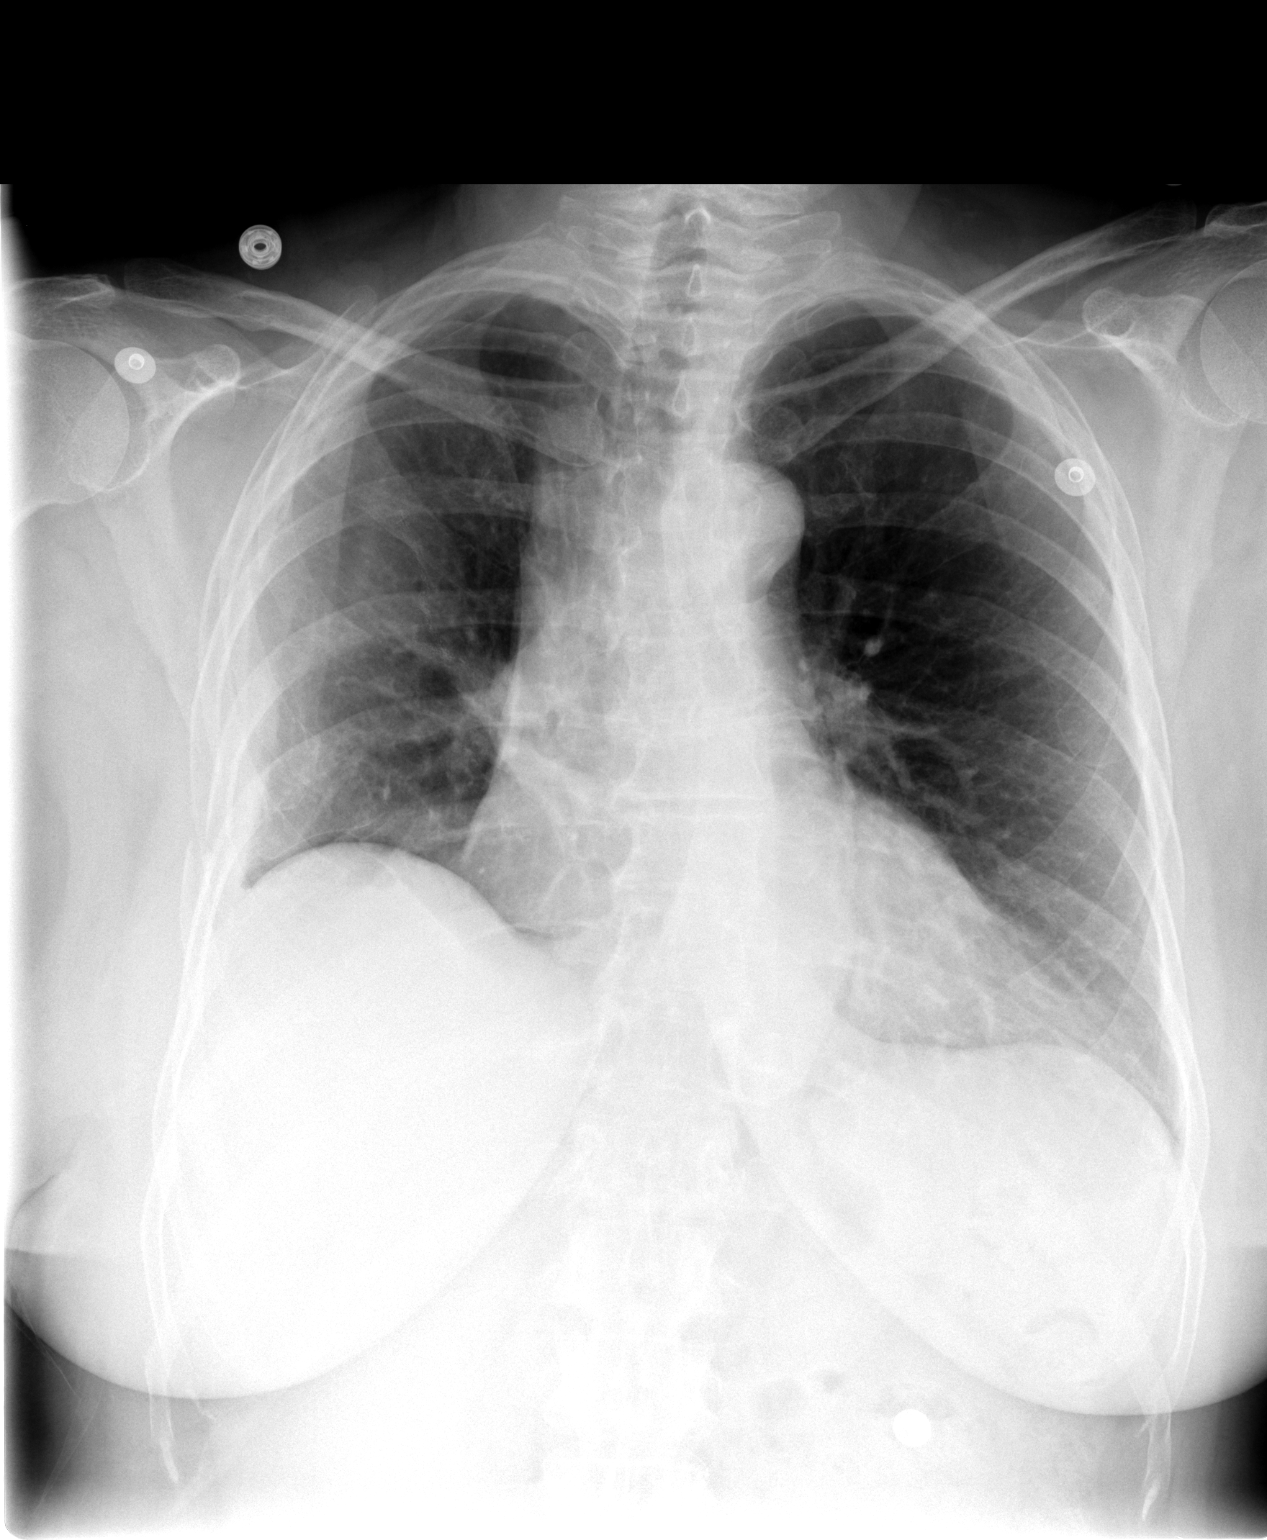

[view not recorded (2 of 2)]
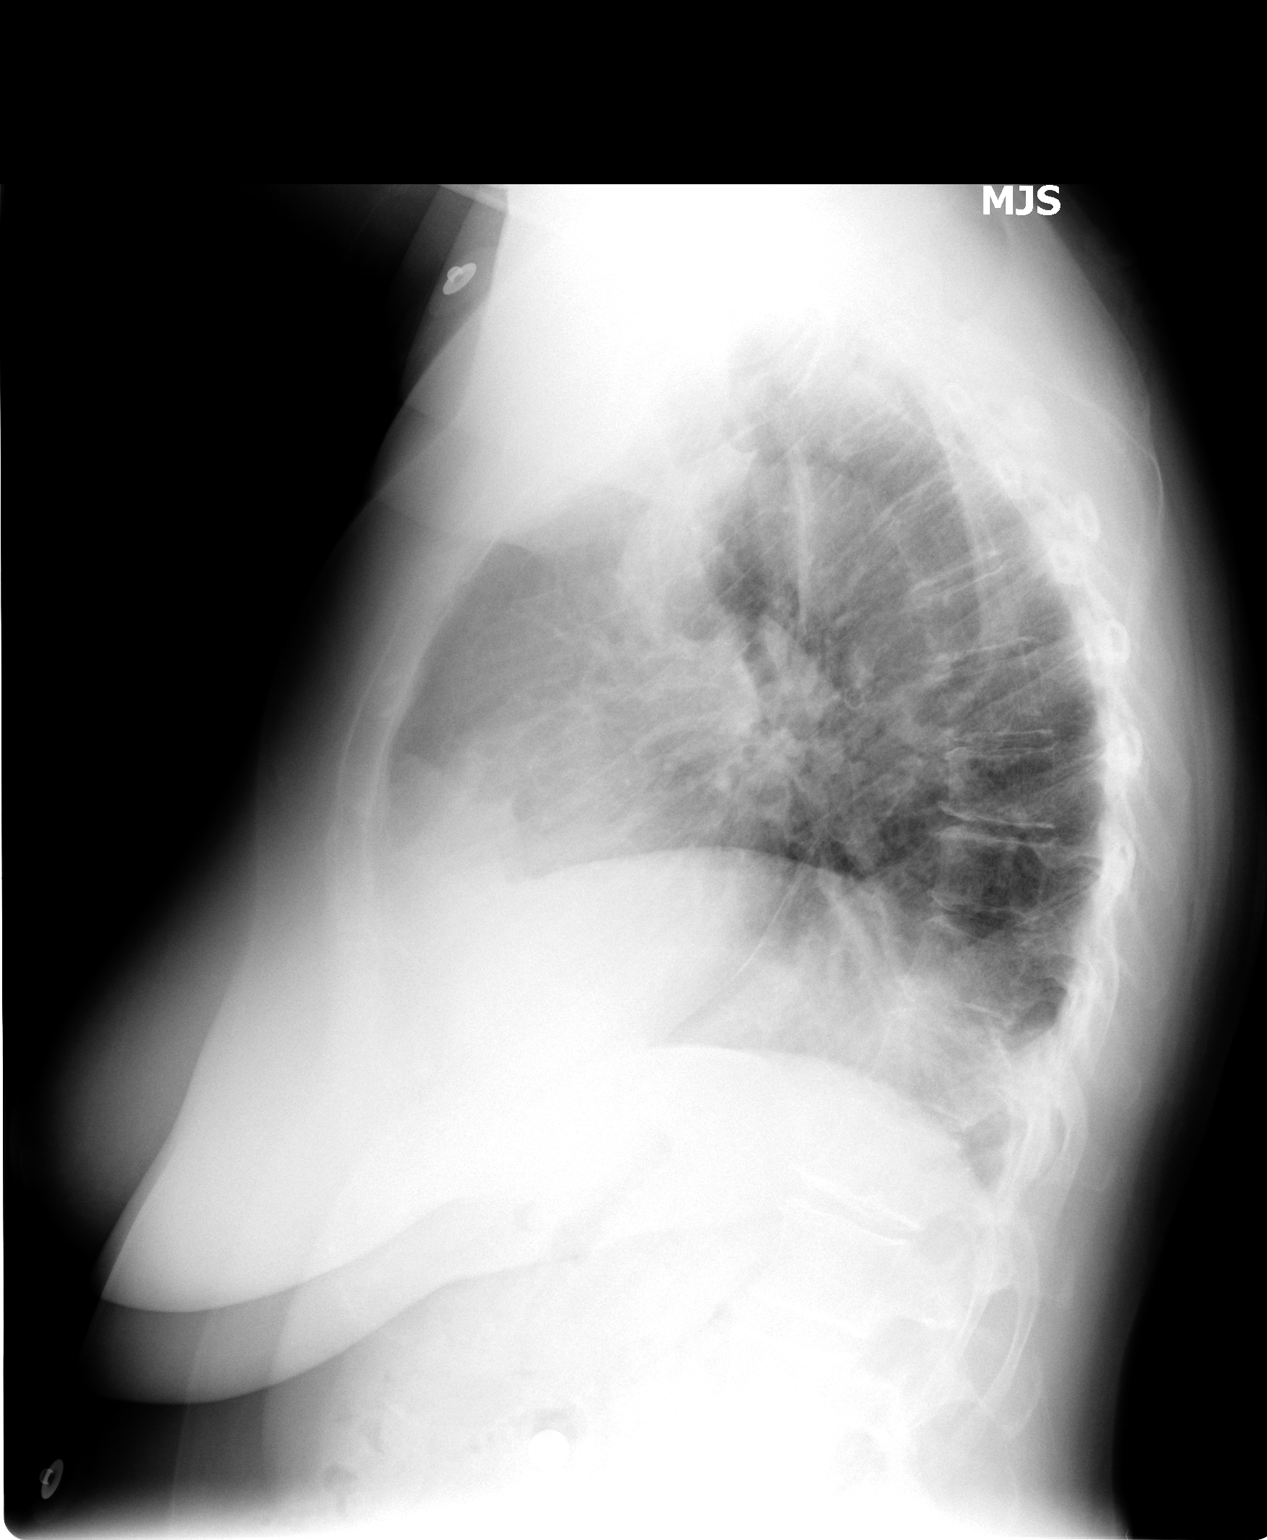

[2 of 2 positions shown; findings below may reference images not displayed]

Cardiomegaly with tortuous aorta.
Mediastinal contours and vascularity otherwise normal.
Right pleural effusion with small right apex pneumothorax, decreased in size since previous study.
Minimal right base atelectasis.
Lungs otherwise clear.
IMPRESSION: Interval decrease in right apical pneumothorax

## 2005-10-19 IMAGING — CR DG CHEST 1V PORT
1 series · 1 of 1 positions shown · non-contrast
Comparison: none

CLINICAL DATA: 53 year old with lung cancer.  Status post insertion of left subclavian Port-a-cath.
 PORTABLE CHEST:
 A single portable view of the chest is compared to an earlier film from the same date. 
 There is a left subclavian Port-a-cath with its tip in the right atrium.  No pneumothorax is seen on the left side.  The heart size is stable.  The mediastinum is somewhat prominent but I think this is due to the position of the patient.

[view not recorded]
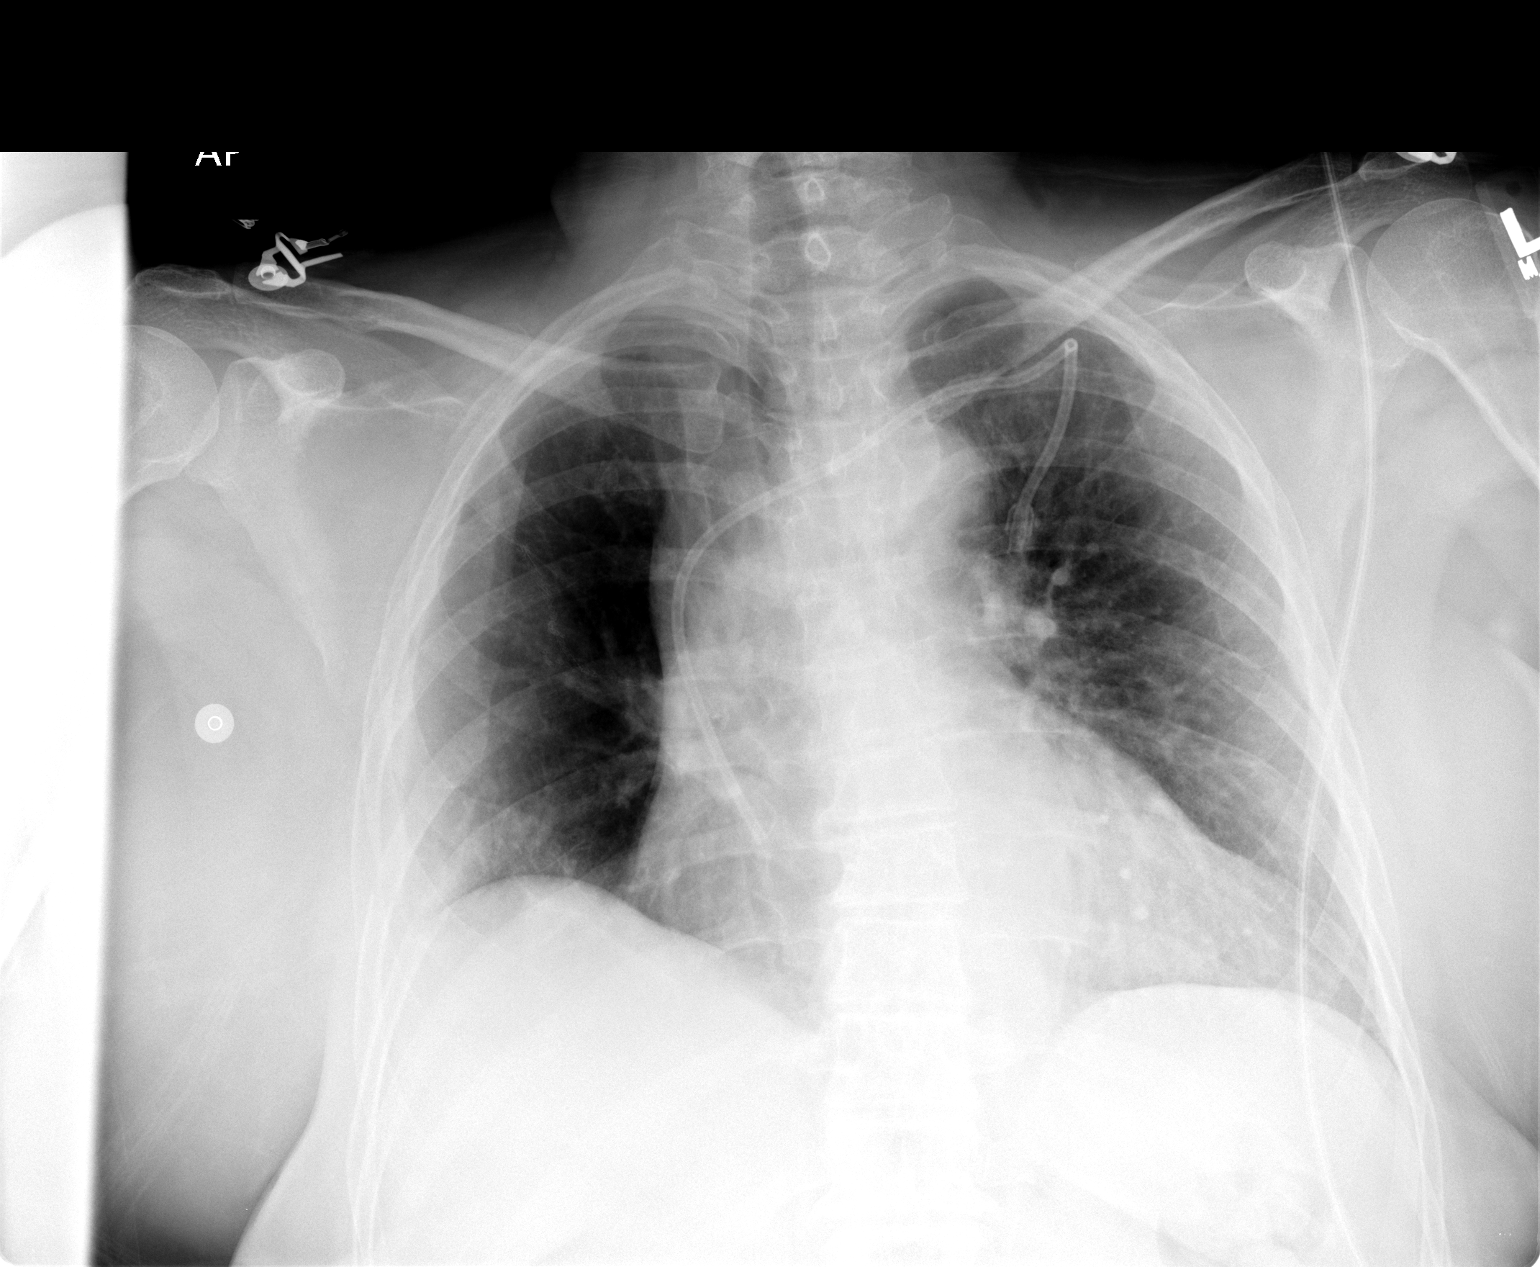

[1 of 1 positions shown; findings below may reference images not displayed]

IMPRESSION: 1.  Left subclavian Port-a-cath in good position without complicating features.  
 2.  Slight widened appearance of the mediastinum is probably due to the patient?s position.

## 2005-10-19 IMAGING — CR DG CHEST 2V
2 series · 2 of 2 positions shown · non-contrast
Comparison: 11/17/03.

CLINICAL DATA: Lung cancer.  Preop respiratory exam for surgery today.  
PA AND LATERAL CHEST:

[view not recorded (1 of 2)]
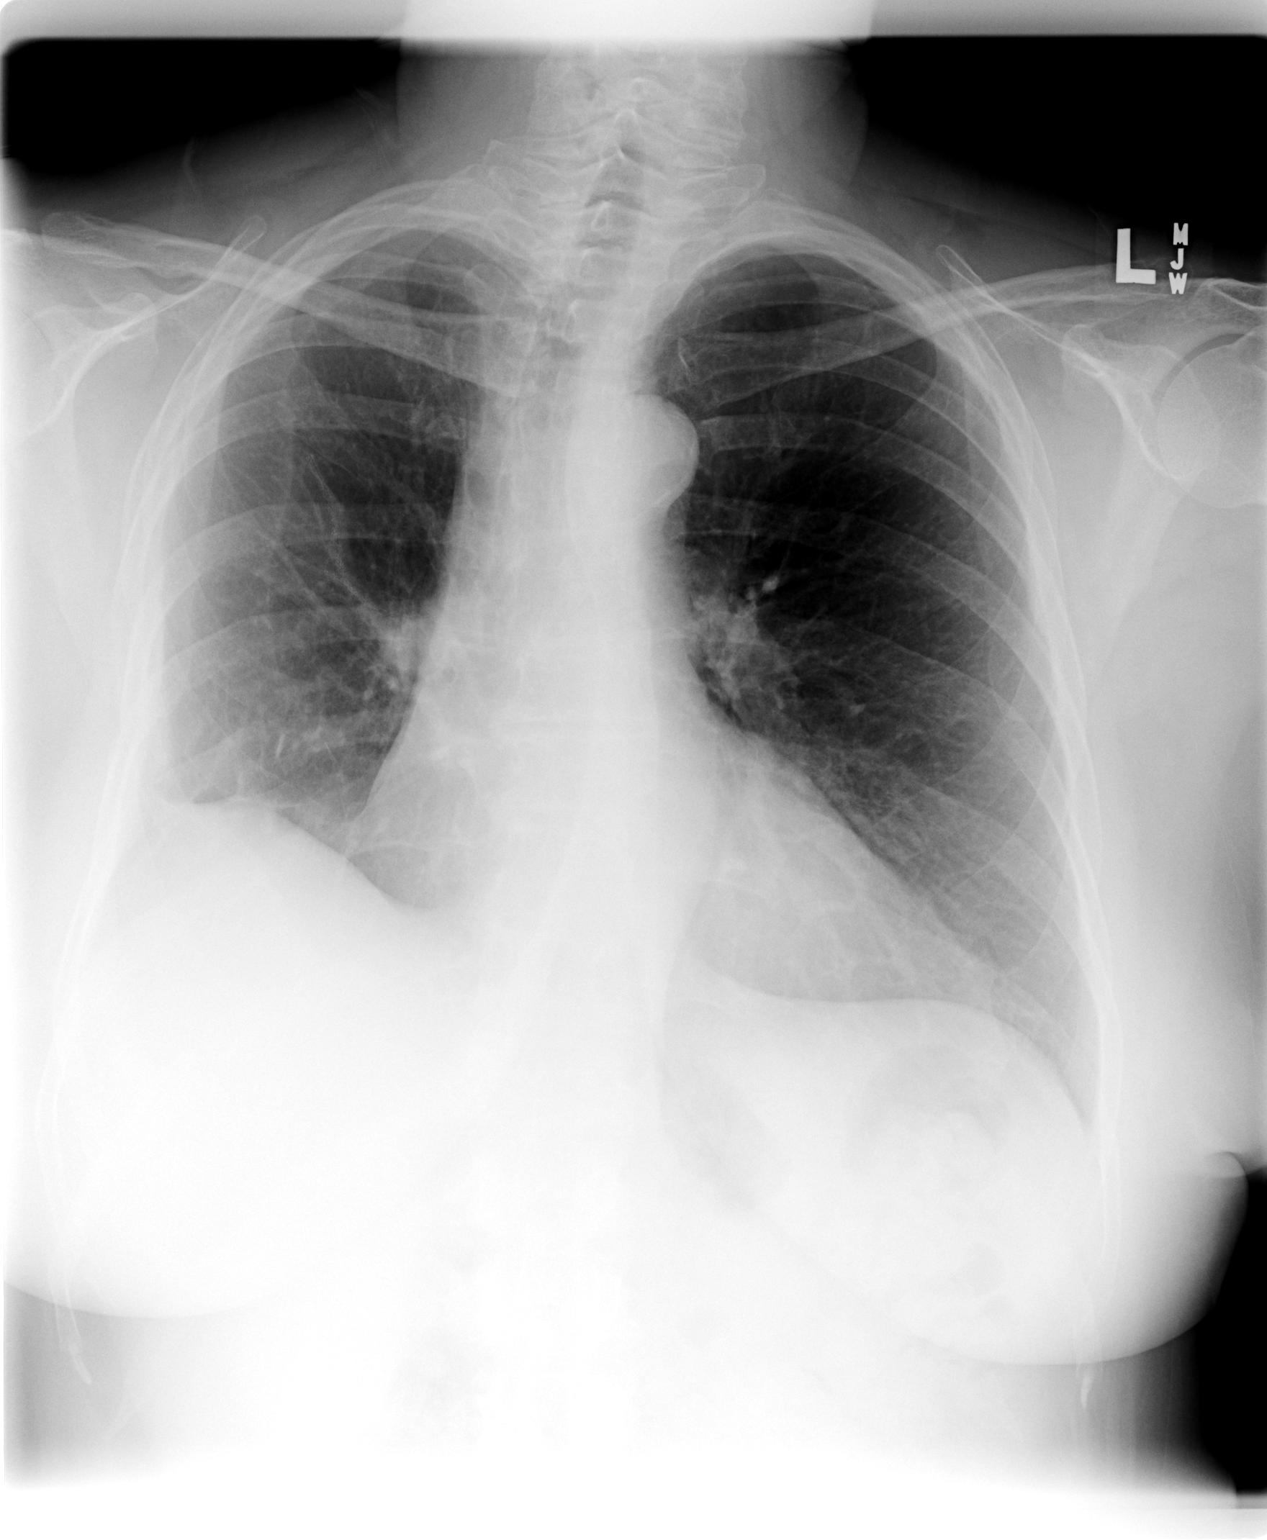

[view not recorded (2 of 2)]
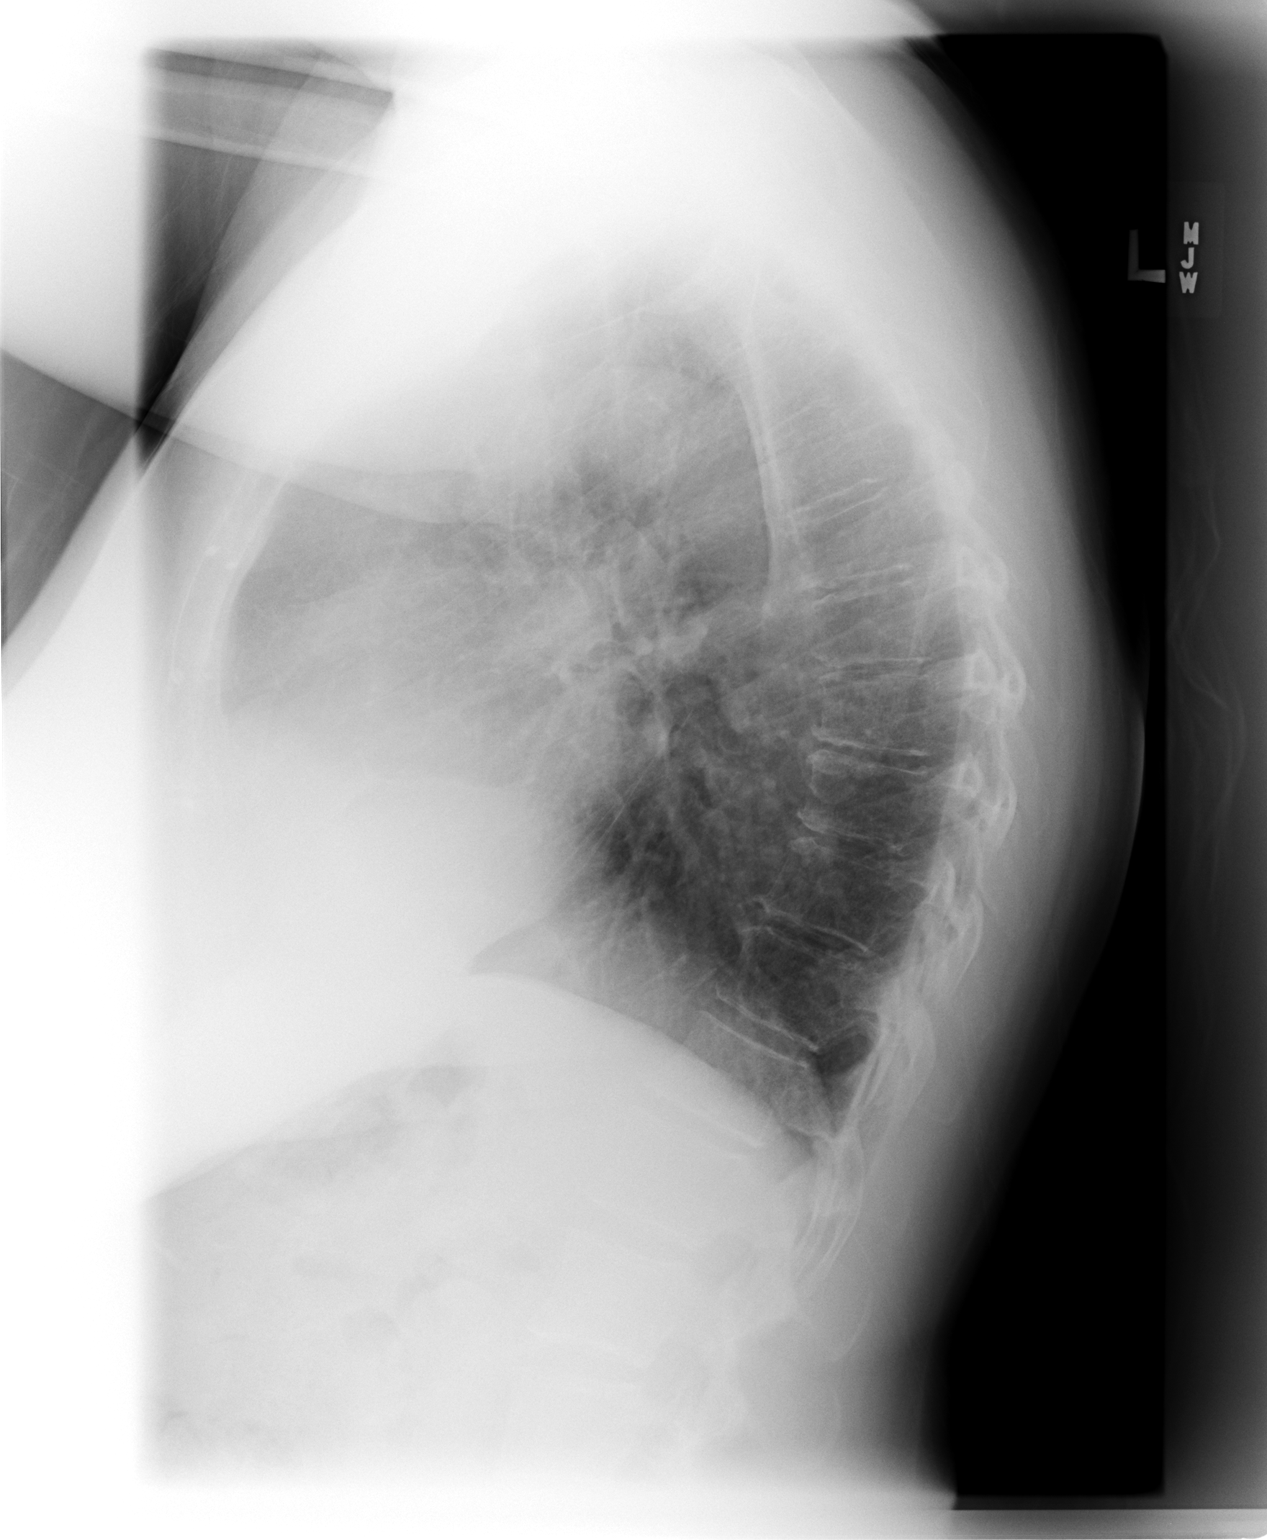

[2 of 2 positions shown; findings below may reference images not displayed]

Previously demonstrated right sided pneumothorax has resolved.  Volume loss and pleural thickening on the right appear stable.  The lungs are clear.  Cardiomediastinal contours are unchanged.
IMPRESSION: Stable postoperative changes on the right.  No residual pneumothorax or acute findings demonstrated.

## 2005-10-22 IMAGING — CR DG CHEST 2V
2 series · 2 of 2 positions shown · non-contrast
Comparison: none

CLINICAL DATA: Questionably infected Port-A-Cath area. 
 TWO VIEW CHEST: 
 Comparison 12/05/03.  There is pleural thickening seen within the right base laterally which appears stable.  There is a Port-A-Cath present with the tip of the catheter within the superior vena cava in satisfactory position.  There is no pneumothorax.  There is mild cardiomegaly.

[view not recorded (1 of 2)]
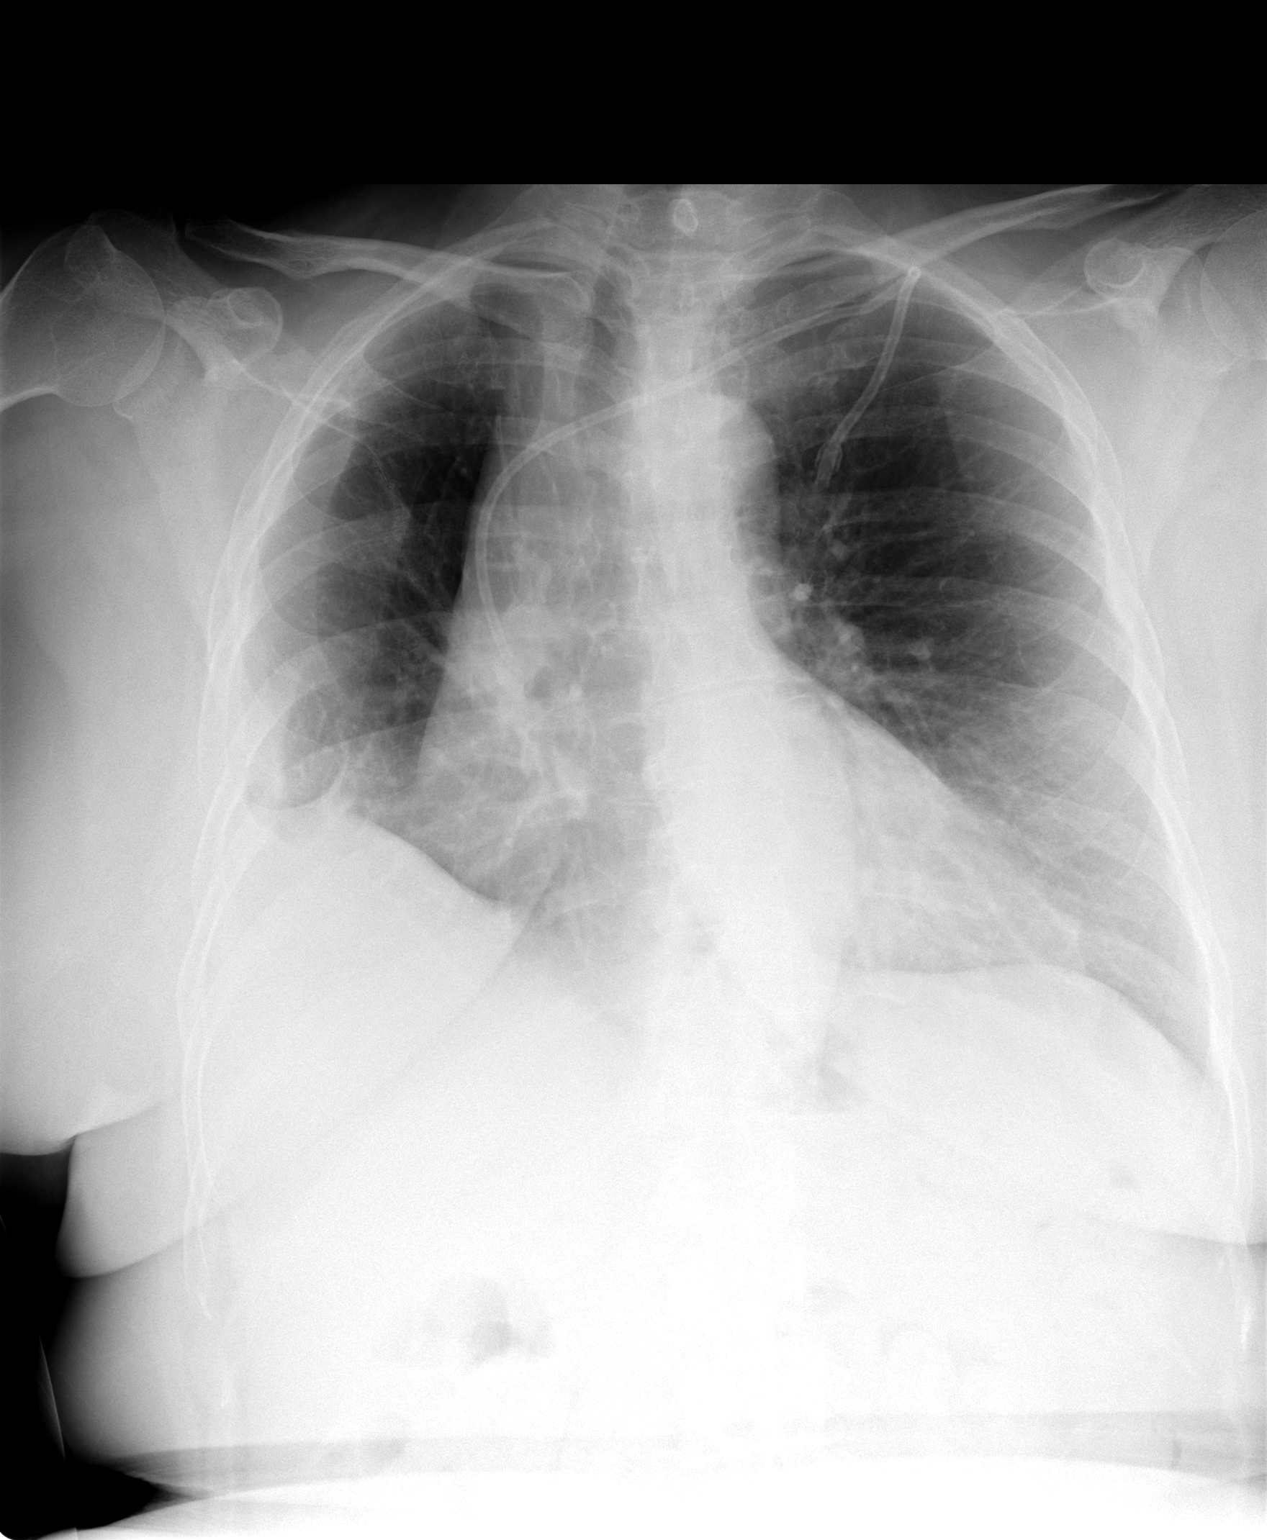

[view not recorded (2 of 2)]
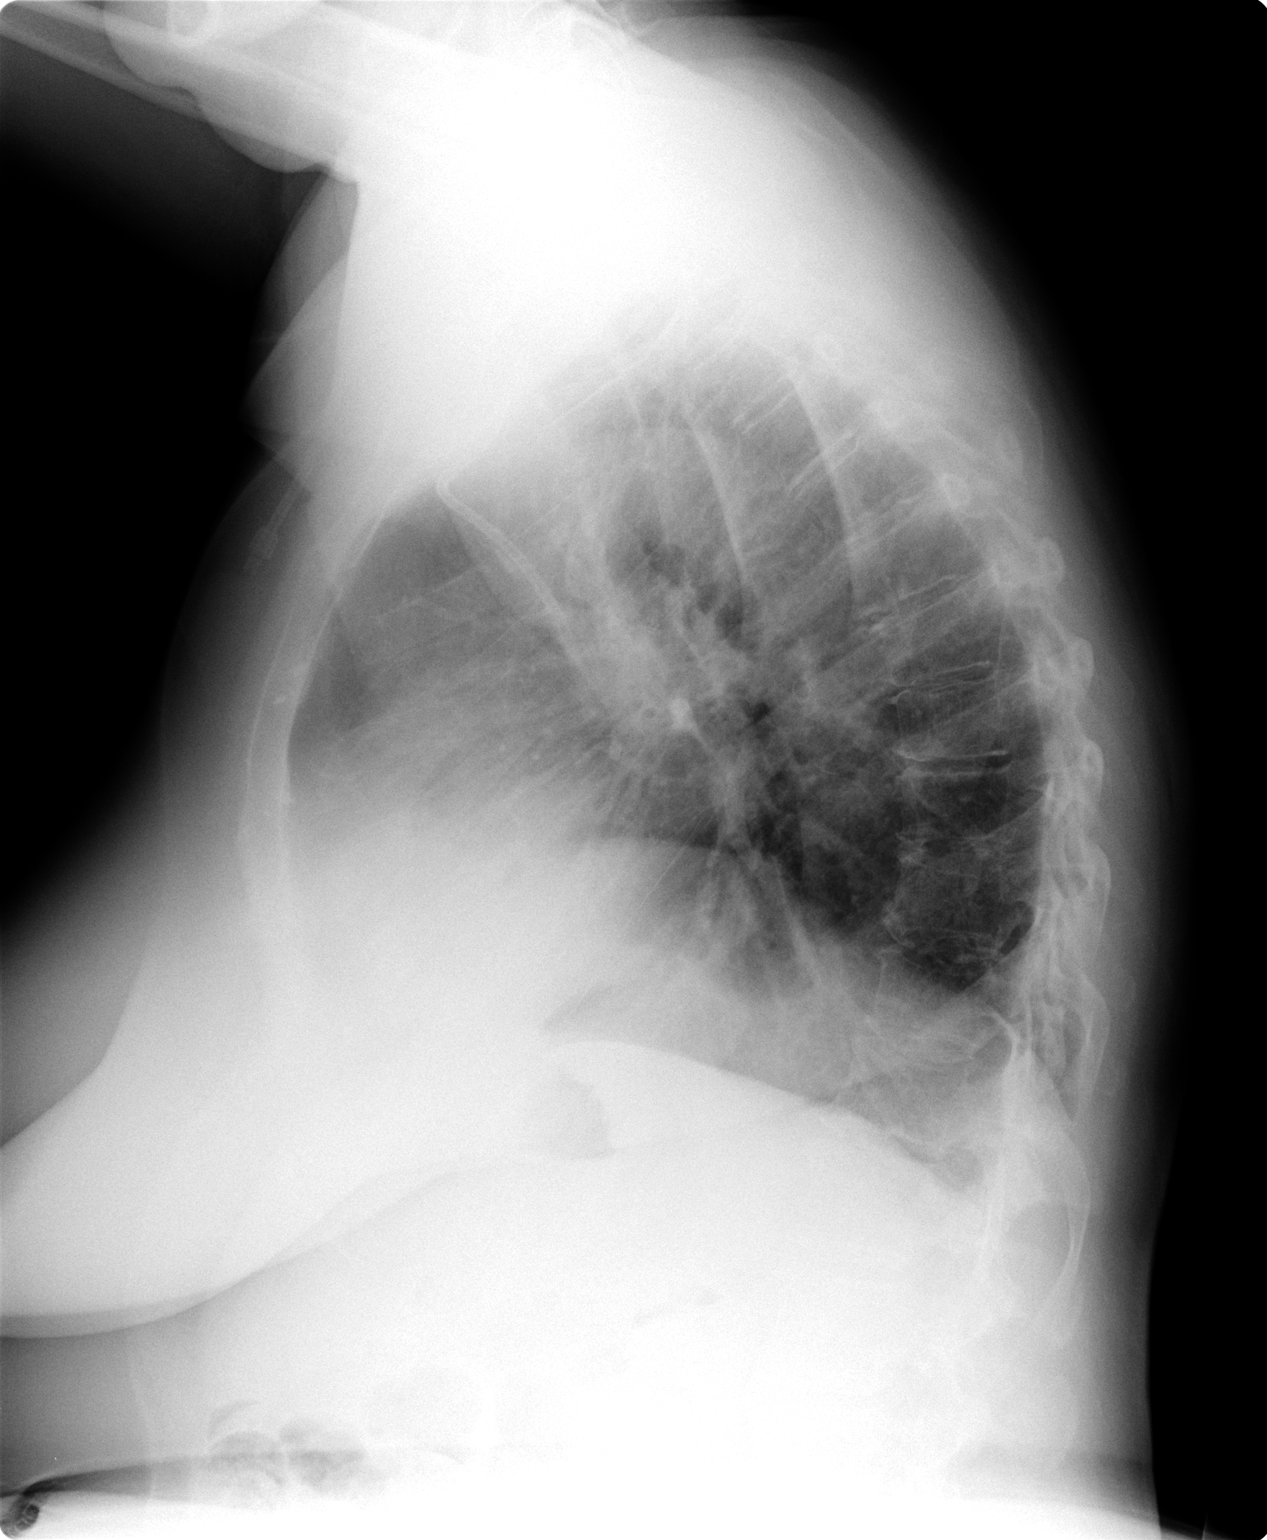

[2 of 2 positions shown; findings below may reference images not displayed]

IMPRESSION: Mild cardiomegaly ? stable.  Satisfactory position of the Port-A-Cath.  Stable scarring changes right hemithorax.

## 2005-10-27 IMAGING — CR DG CHEST 1V PORT
1 series · 1 of 1 positions shown · non-contrast
Comparison: none

CLINICAL DATA: Lung CA.  Vomiting.  On chemotherapy.
 PORTABLE CHEST - 12/13/03 AT 0022 HOURS:
 Compared to a chest x-ray of 12/08/03, the lungs are clear.  The right hemidiaphragm remains elevated.  The Port-a-cath is unchanged.  Mild cardiomegaly is stable.

[view not recorded]
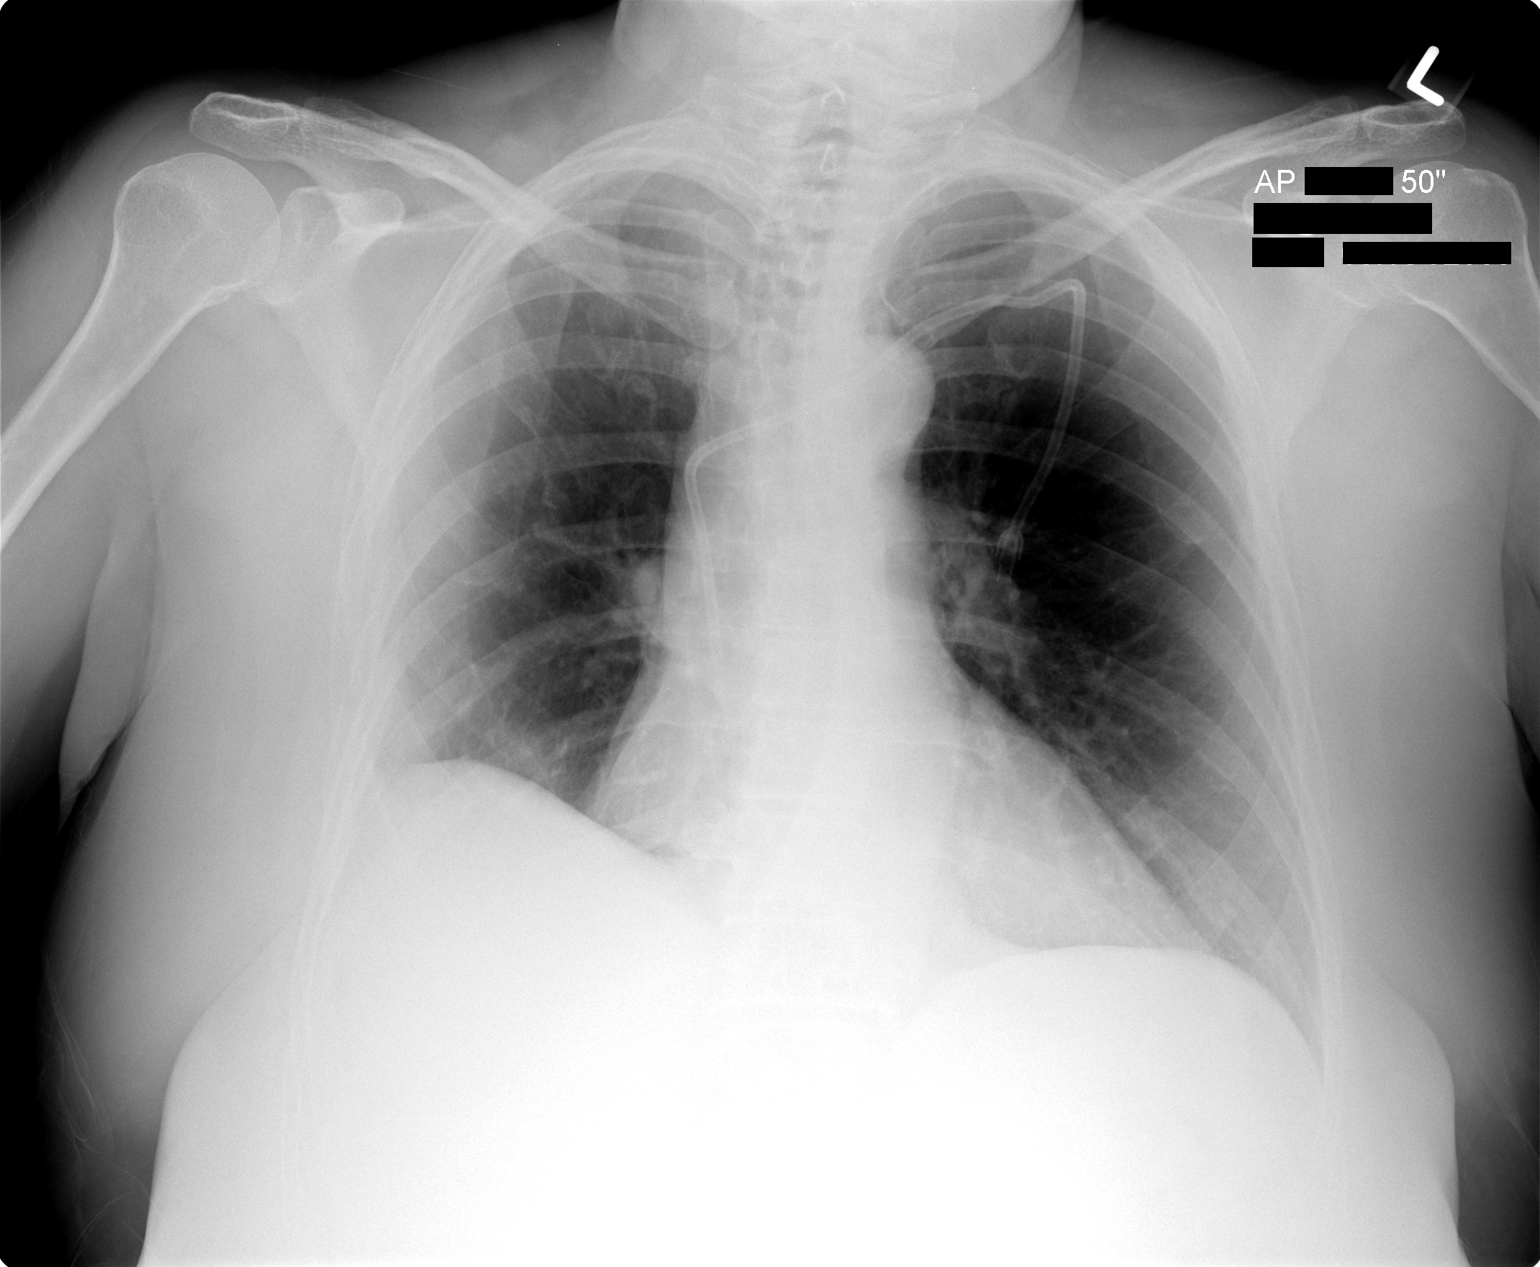

[1 of 1 positions shown; findings below may reference images not displayed]

IMPRESSION: Stable chest x-ray.  No active lung disease.   Port-a-cath unchanged.

## 2005-11-07 ENCOUNTER — Ambulatory Visit: Payer: Self-pay | Admitting: Hematology & Oncology

## 2005-11-11 ENCOUNTER — Ambulatory Visit (HOSPITAL_COMMUNITY): Admission: RE | Admit: 2005-11-11 | Discharge: 2005-11-11 | Payer: Self-pay | Admitting: Hematology & Oncology

## 2005-11-22 IMAGING — CR DG CHEST 2V
2 series · 2 of 2 positions shown · non-contrast
Comparison: none

CLINICAL DATA: Right lung surgery, Saturday November, 2003 for carcinoma.  Follow-up.
 CHEST ? TWO VIEWS:
 Two views of the chest are compared to a chest x-ray from [HOSPITAL] dated 12/13/03. 

 Post operative changes on the right are stable.  Port-A-Cath remains with the tip in the SVC.  The left lung is clear.  The heart is within the upper limits of normal.

[view not recorded (1 of 2)]
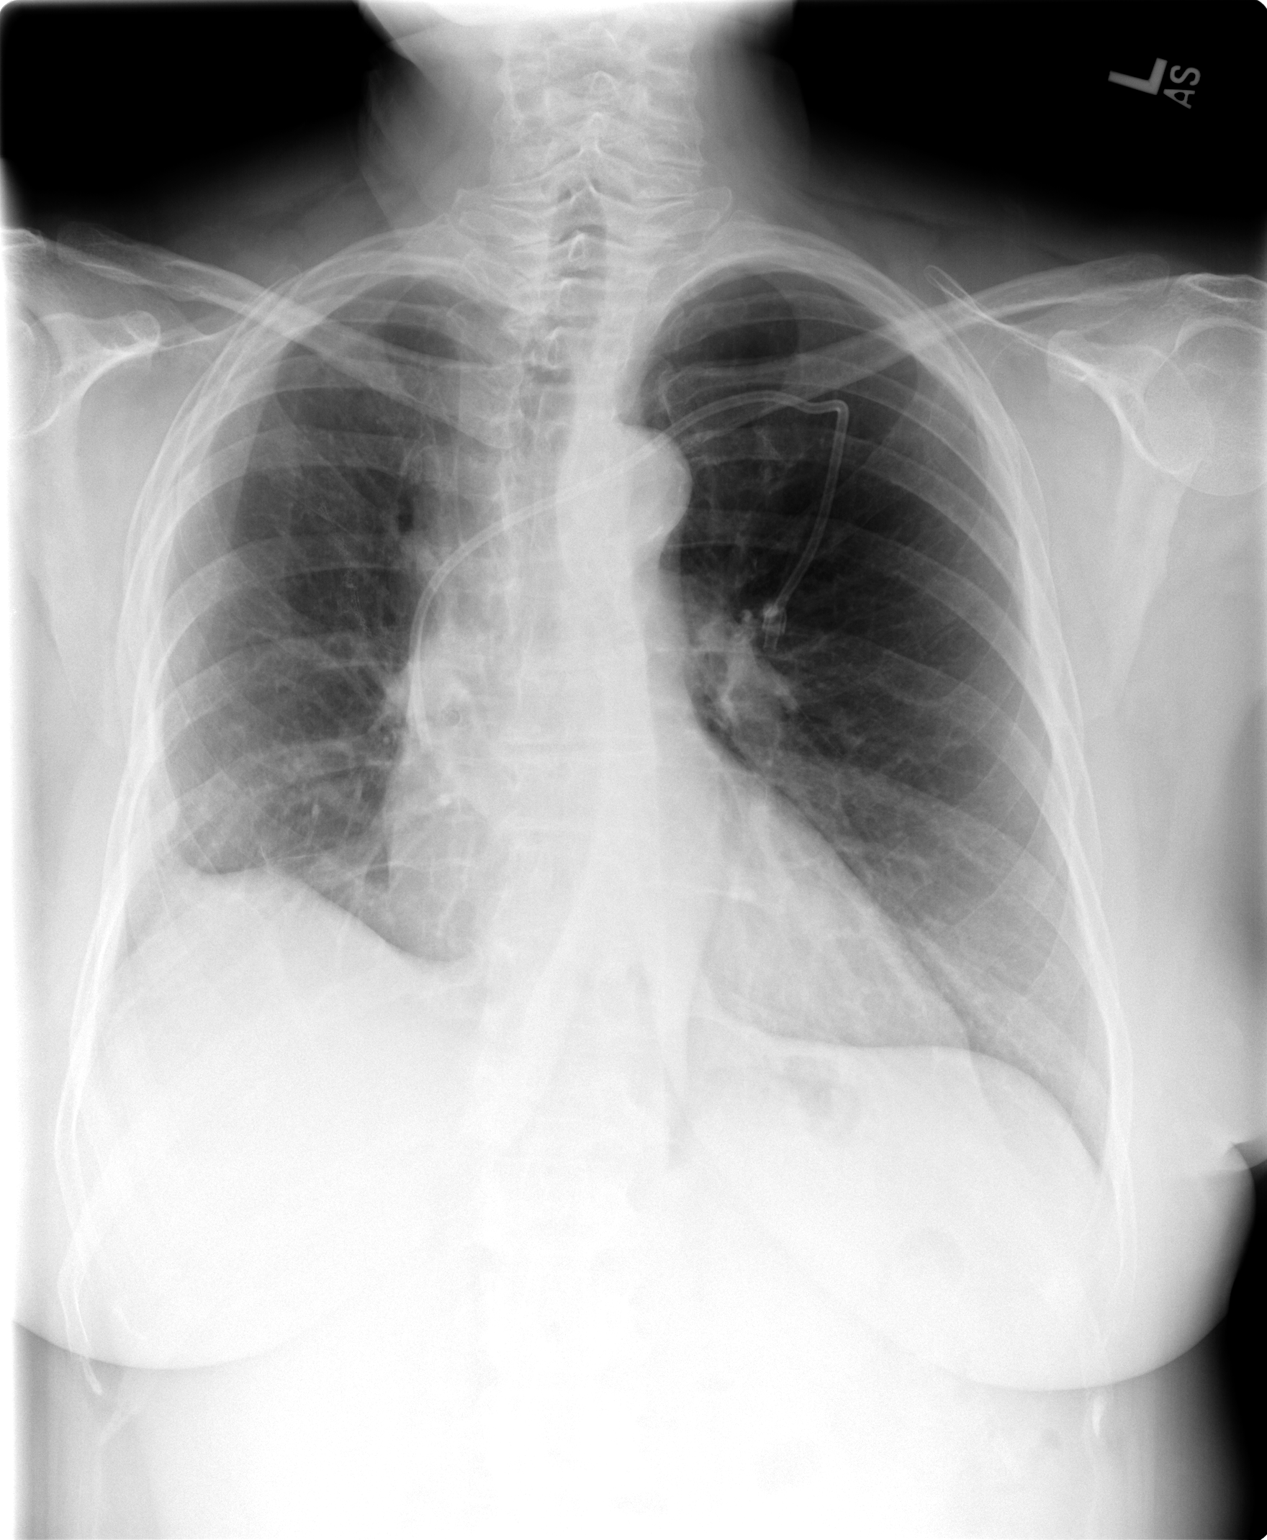

[view not recorded (2 of 2)]
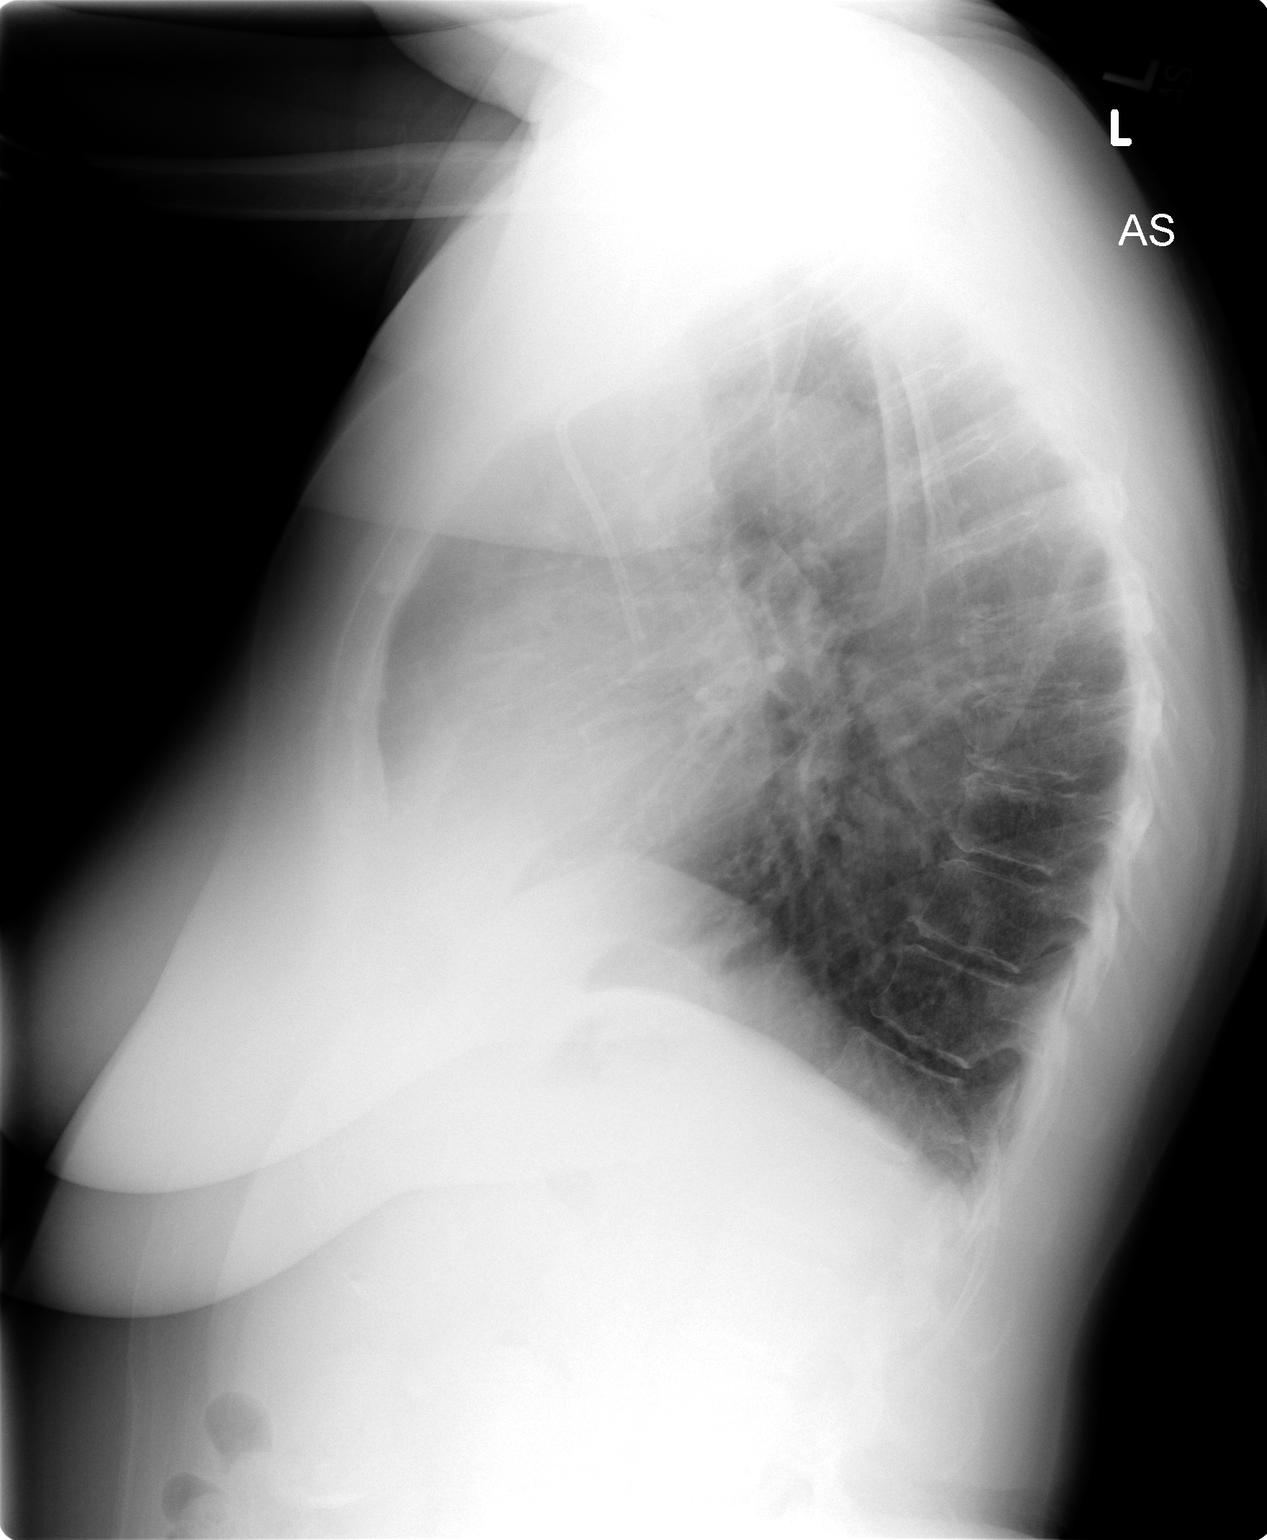

[2 of 2 positions shown; findings below may reference images not displayed]

IMPRESSION: Stable post op changes on the right.  Port-A-Cath tip remains in the SVC.

## 2005-12-28 IMAGING — MR MR LUMBAR SPINE WO/W CM
4 of 8 series · 19 of 48 positions shown · IV contrast (omniscan)
Comparison: none

CLINICAL DATA: Lung cancer, low back pain extending down both legs. 
MRI OF THE LUMBAR SPINE WITHOUT CONTRAST ? 02/13/04:
Comparing PET scan of 09/24/03.
TECHNIQUE: Multiplanar and multisequence MR images are obtained through the lumbar spine before and after intravenous administration of 20 cc of Omniscan gadolinium contrast.

[Series 2: T2 · sagittal · 4.0mm · 0.47mm/px · 4 of 12 slices shown (1 of 2)]
[im 1/12]
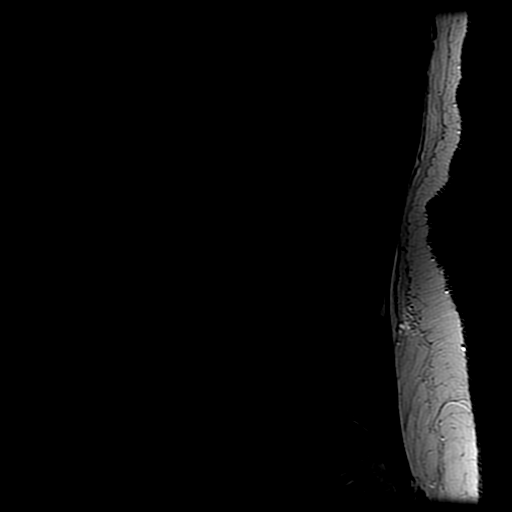
[im 4/12]
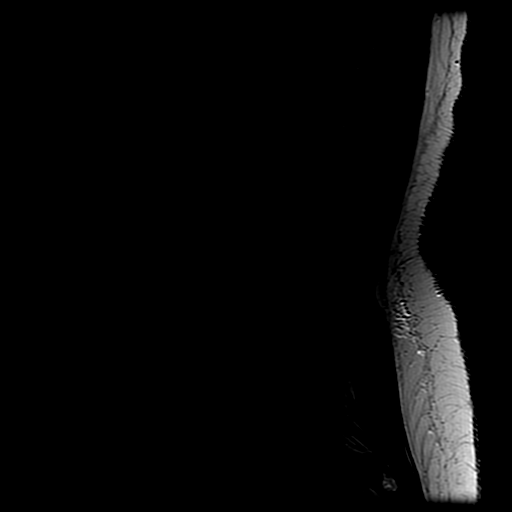
[im 8/12]
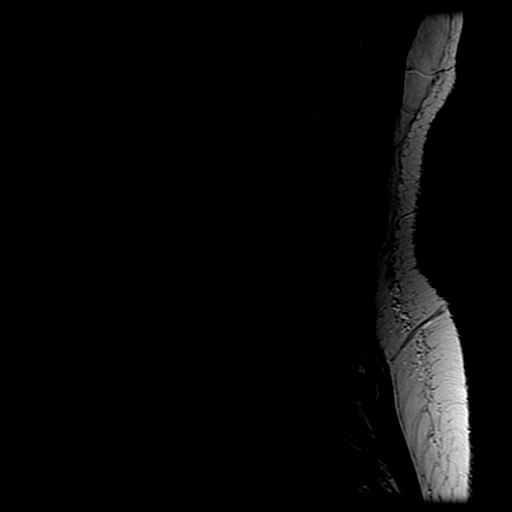
[im 12/12]
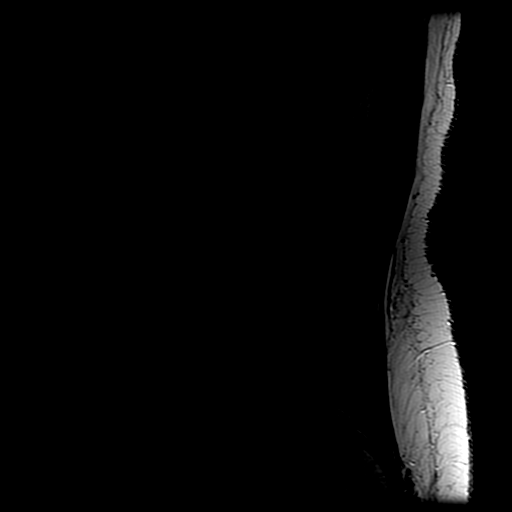

[Series 4: T1 · sagittal · 4.0mm · 0.51mm/px · 3 of 12 slices shown (1 of 2)]
[im 1/12]
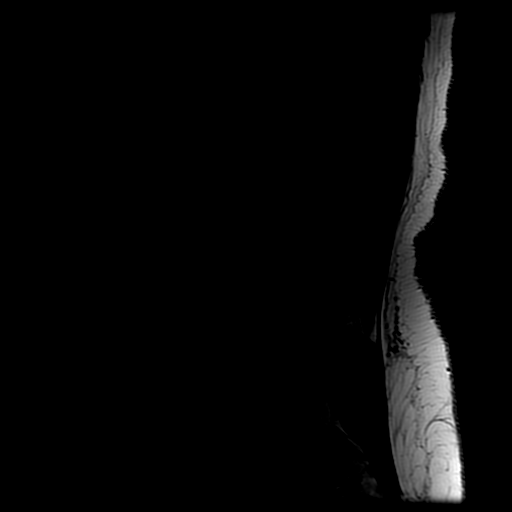
[im 8/12]
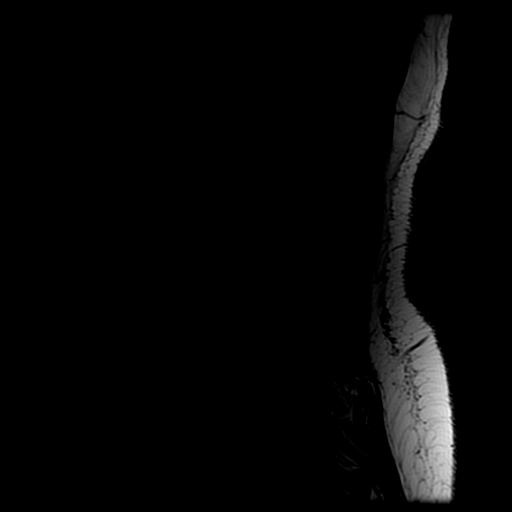
[im 12/12]
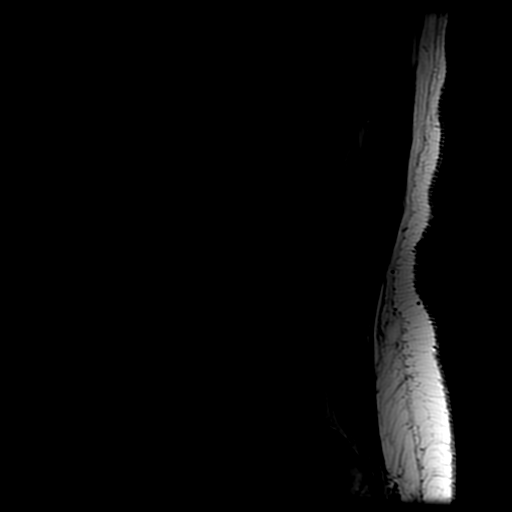

[Series 6: T2 · axial · 4.0mm · 0.29mm/px · z∈[-132,+119]mm · 9 of 27 slices shown (2 of 2)]
[im 1/27]
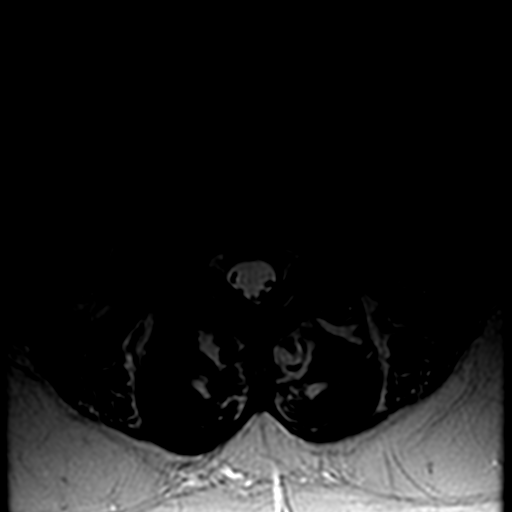
[im 4/27]
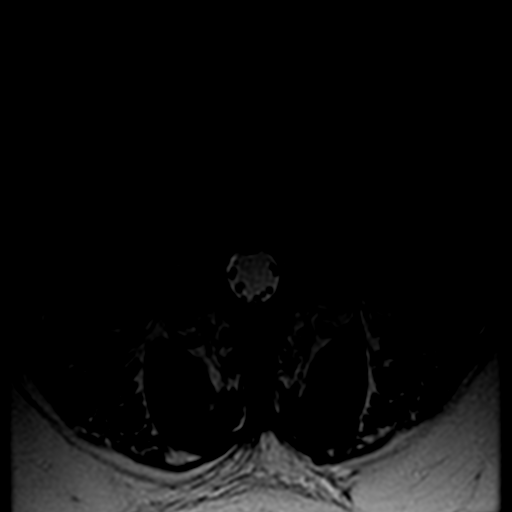
[im 7/27]
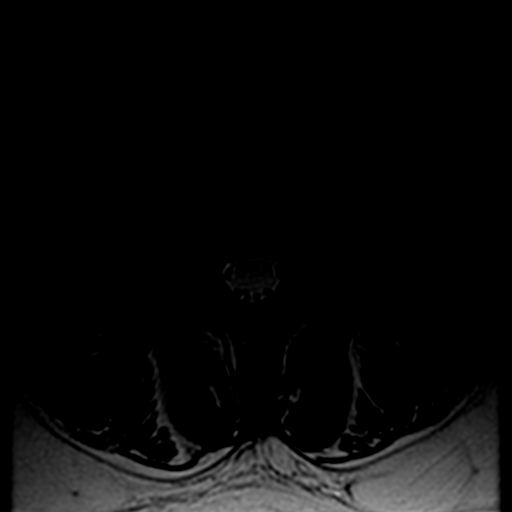
[im 10/27]
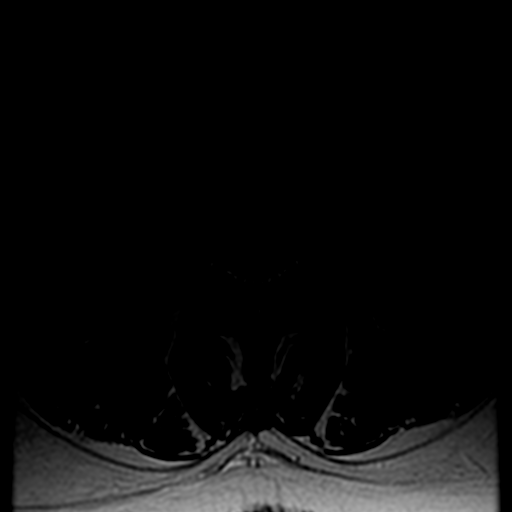
[im 14/27]
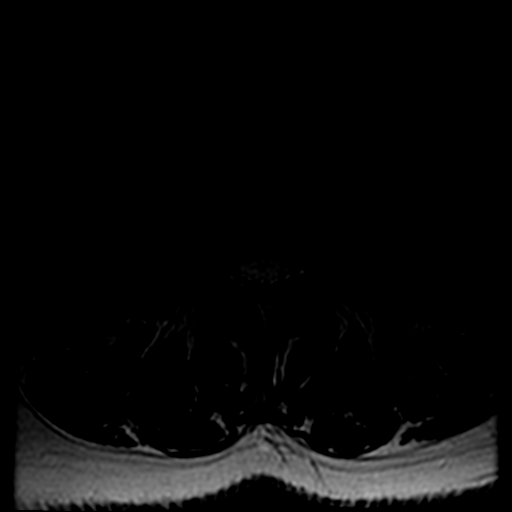
[im 17/27]
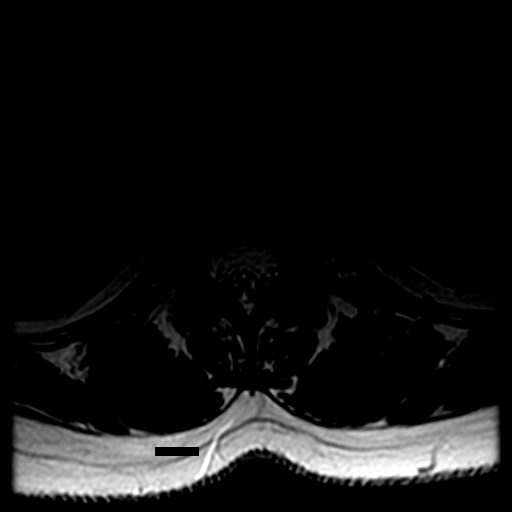
[im 20/27]
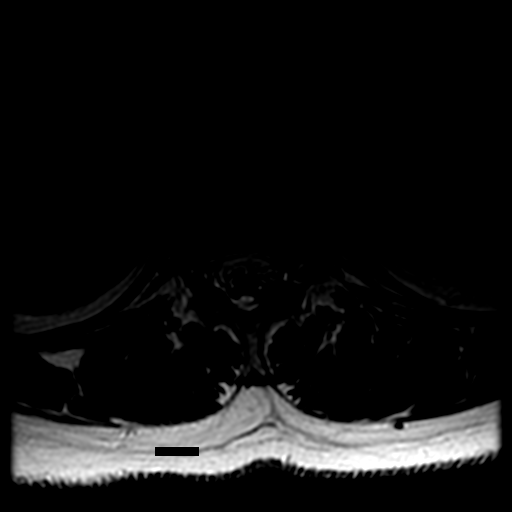
[im 23/27]
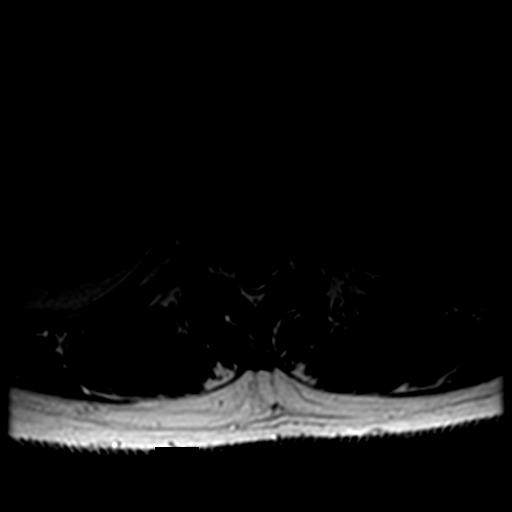
[im 27/27]
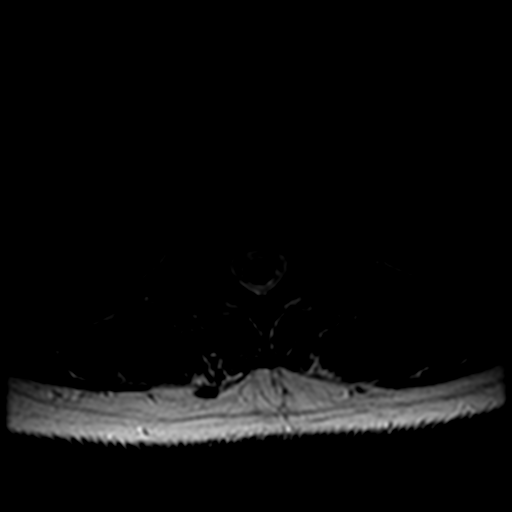

[Series 7: T1 · axial · 4.0mm · 0.29mm/px · z∈[-120,+101]mm · 3 of 27 slices shown (2 of 2)]
[im 4/27]
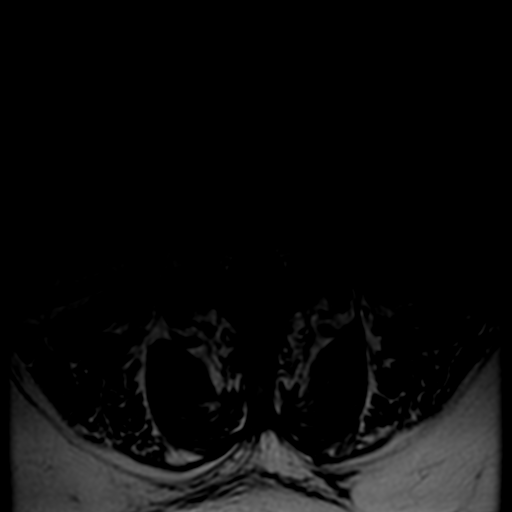
[im 14/27]
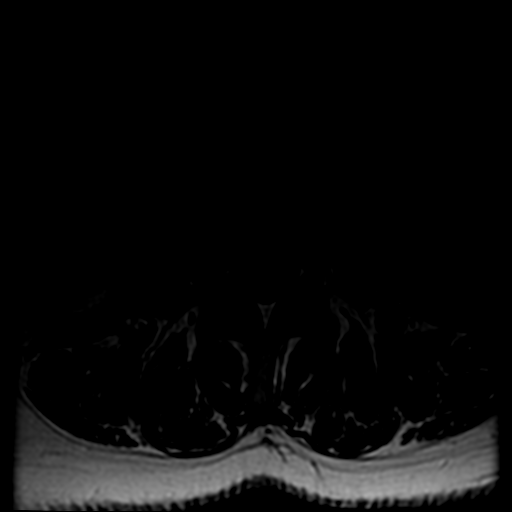
[im 23/27]
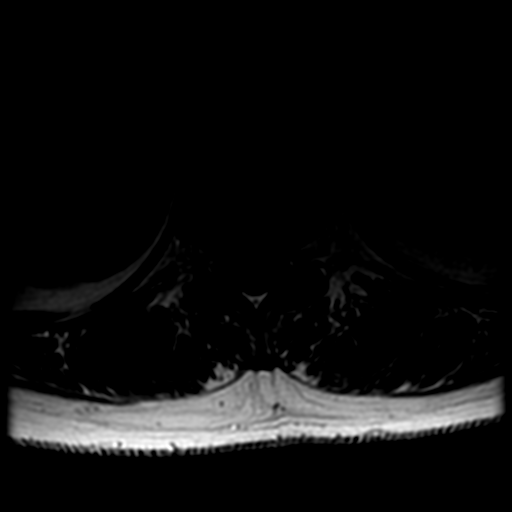

[19 of 48 positions shown; findings below may reference images not displayed]

FINDINGS: For purposes of this dictation, I assume that L5 is the lowest segmental lumbar type non-rib bearing vertebra.  
The conus medullaris is at the T12-L1 level.   The conus appears unremarkable.  There is no significant adenoma enhancement in the lumbar spine to suggest metastatic malignancy.  No malalignment.  There is some intervertebral disc desiccation at T11-12 and T12-L1.  Additional findings at individual levels are as follows.
T11-12:  Central disc herniation is present. AP diameter of the thecal sac is 10 mm which is borderline for central stenosis.  No significant foraminal stenosis. 
T12-L1:  A broad-based disc bulge is present at this level, but there is no significant central or foraminal stenosis.  
L1-2:  Unremarkable.  
L2-3:  Unremarkable. 
L3-4:  Mild broad-based disc bulge is slightly eccentric to the left side and extends into the left inferior neural foramen, but does not cause any foraminal stenosis.   
L4-5:  Unremarkable. 
L5-S1:  Minimal facet overgrowth but no significant central or foraminal stenosis.
IMPRESSION: 1.  No evidence of lumbar spine malignancy based on noncontrast images.
2.  Central disc herniation at T11-12 causes borderline central stenosis. 
3.  Mild broad-based disc bulge at T12- L1 but without significant central or foraminal stenosis.

## 2006-02-06 IMAGING — CT CT CHEST W/ CM
1 of 2 series · 14 of 30 positions shown, 18 images · IV contrast (agent unspecified)
Comparison: PA and lateral chest 01/08/04.

CLINICAL DATA: Hx of lung ca; pt is status post chemotherapy and right upper lobectomy
CHEST CT WITH CONTRAST:

[Series 2: chest routine 5.0 b30f · axial · 0.62mm/px · z∈[-215,+50]mm · 14 of 63 slices shown, 18 images]
[im 5/63  mediastinal]
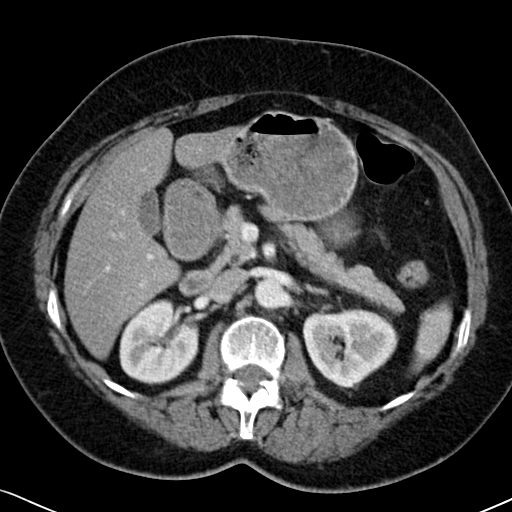
[im 5/63  lung]
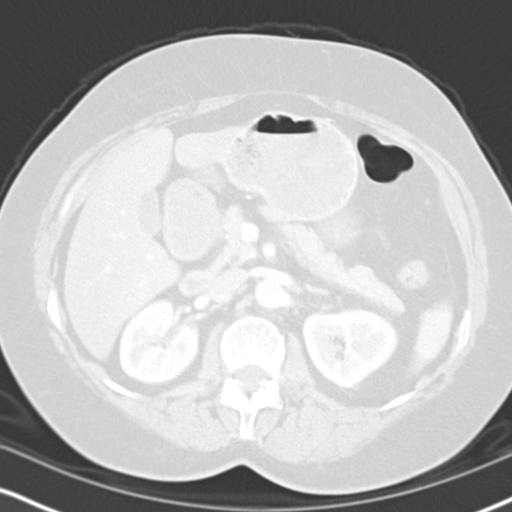
[im 9/63  lung]
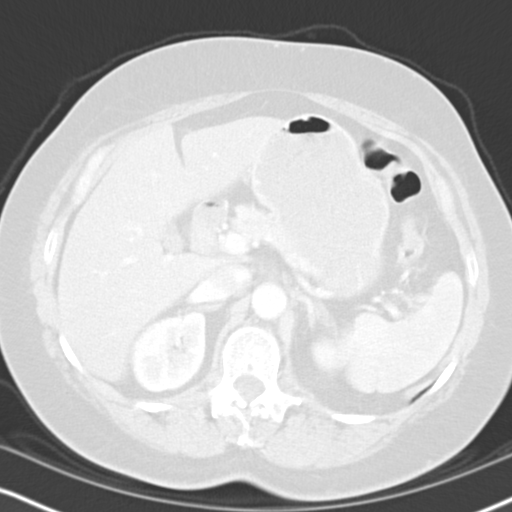
[im 14/63  lung]
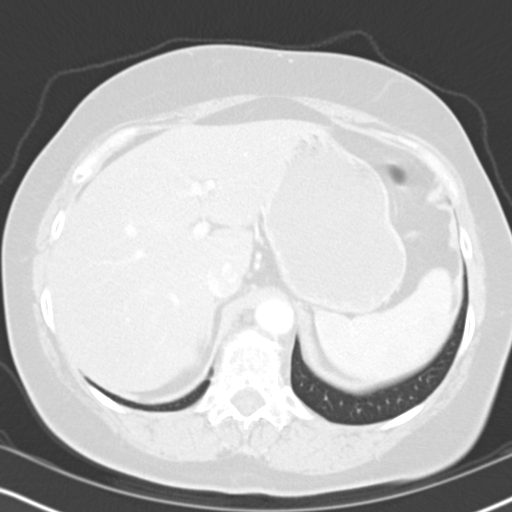
[im 18/63  lung]
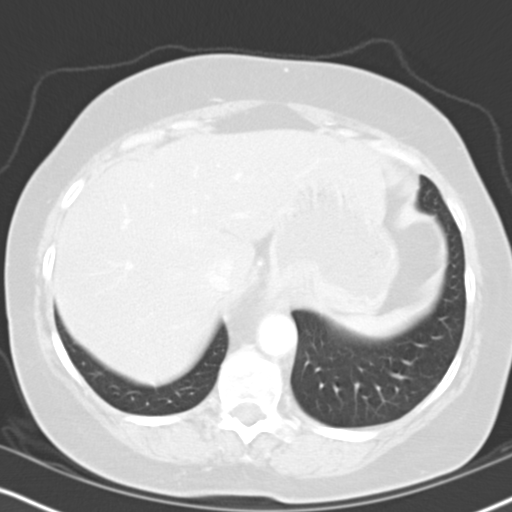
[im 23/63  mediastinal]
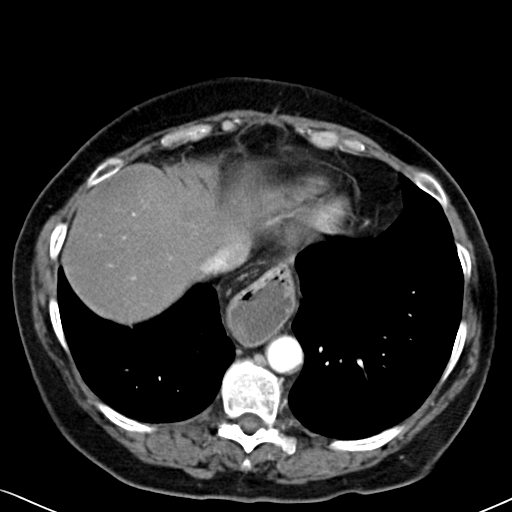
[im 23/63  lung]
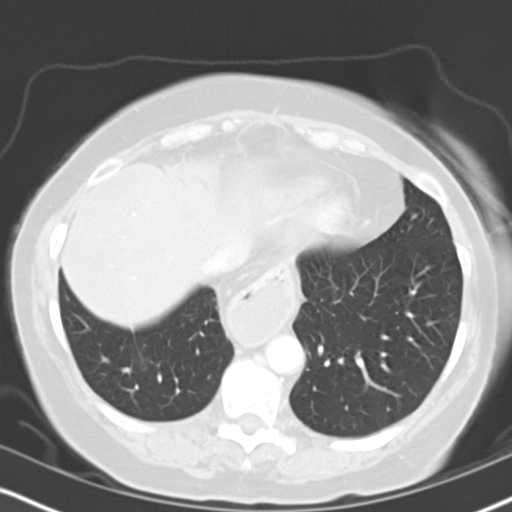
[im 27/63  lung]
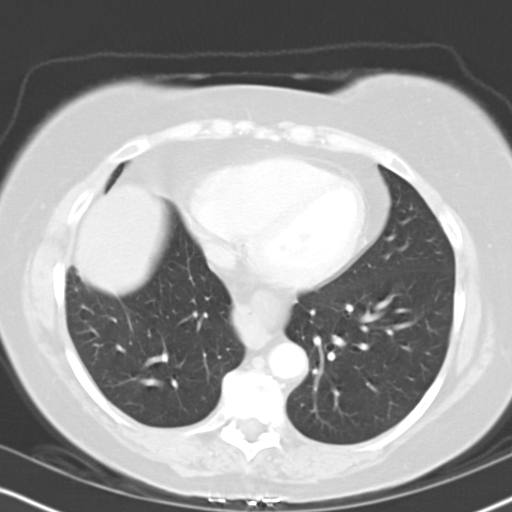
[im 30/63  lung]
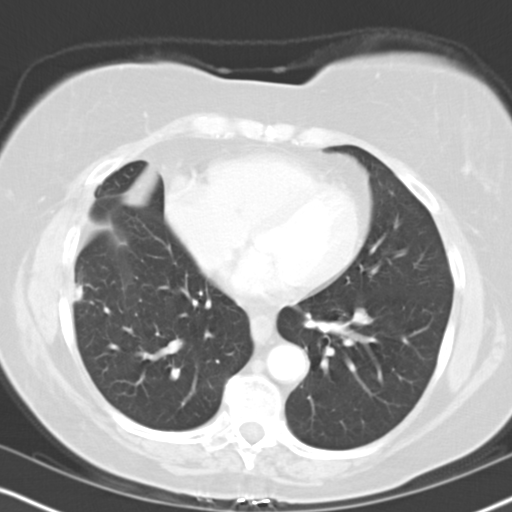
[im 32/63  lung]
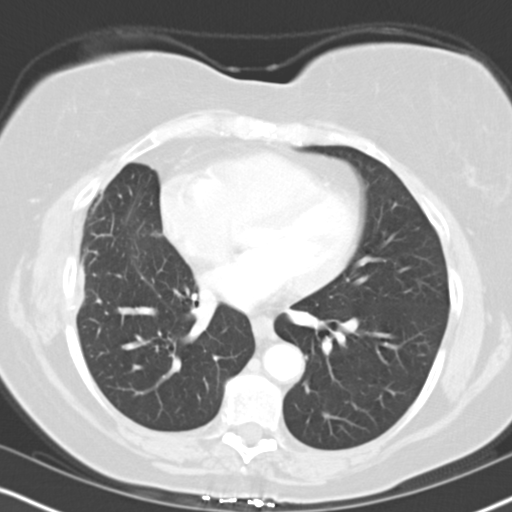
[im 36/63  mediastinal]
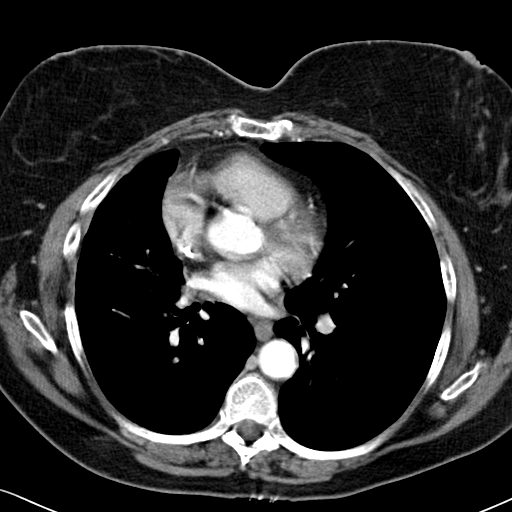
[im 36/63  lung]
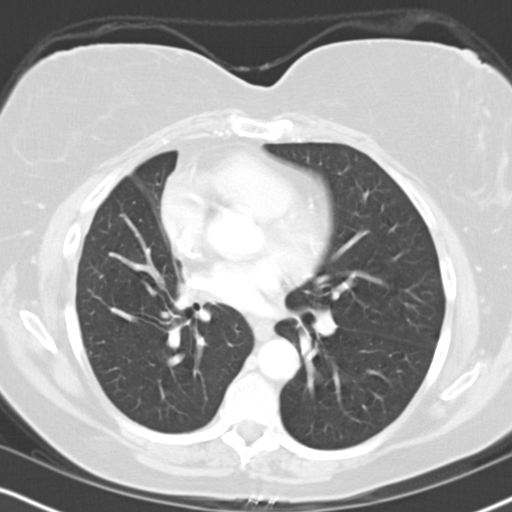
[im 40/63  lung]
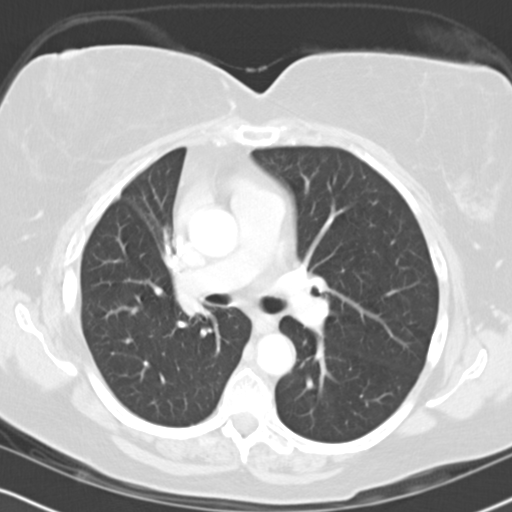
[im 45/63  lung]
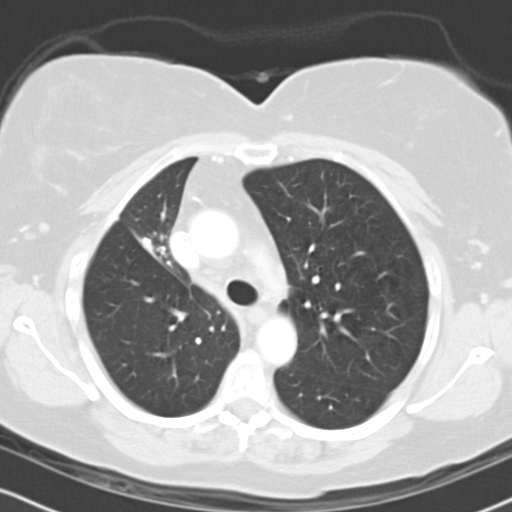
[im 49/63  lung]
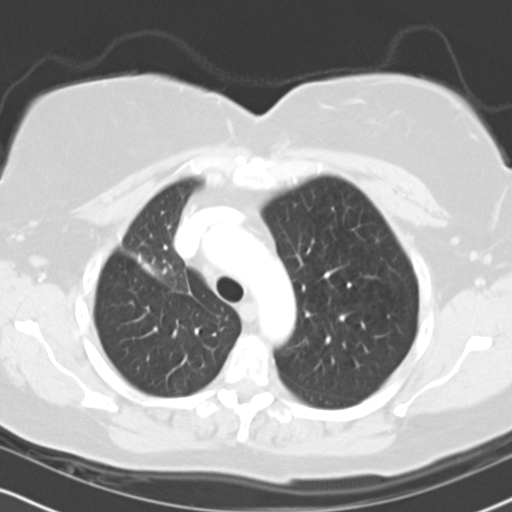
[im 54/63  mediastinal]
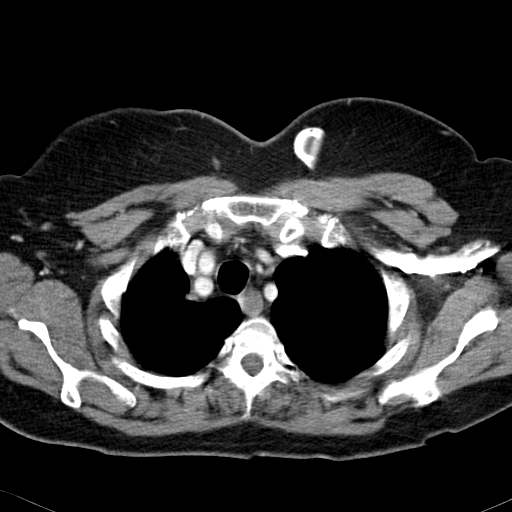
[im 54/63  lung]
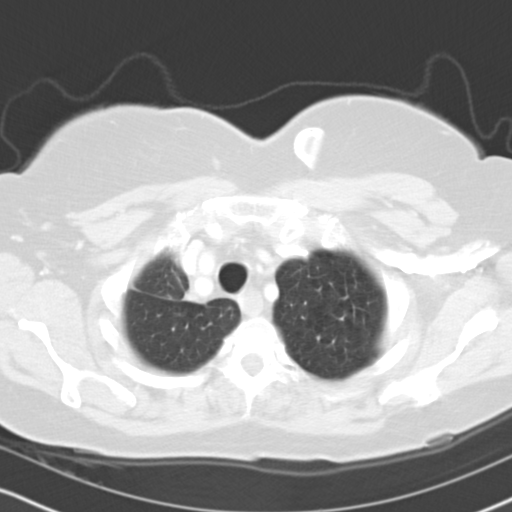
[im 58/63  lung]
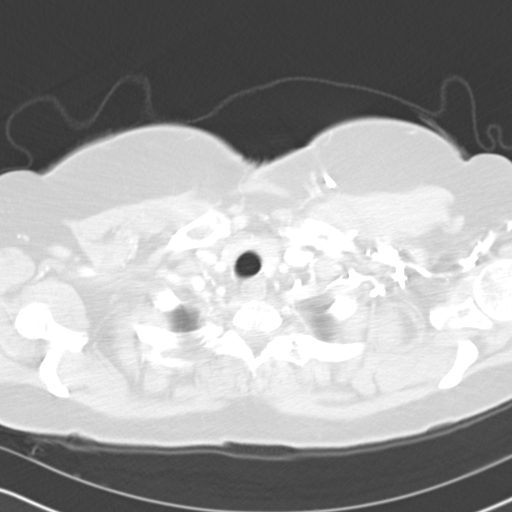

[14 of 30 positions shown; findings below may reference images not displayed]

PET CT scan 09/24/03.  
Contiguous axial CT images were taken through the chest after the administration of 100 cc of Omnipaque 300 contrast material. 
A few small lymph nodes are identified in the axilla bilaterally.  No hilar or mediastinal lymphadenopathy is seen.  Atherosclerotic calcification is seen in the aorta and coronary arteries.  Heart size is normal.  Hiatal hernia is noted.  Postoperative change is seen along the right chest wall where there are some soft tissue thickening and disruption of right lateral rib.  Previously identified pulmonary nodule is no longer seen.  There is some linear opacity in the right mid lung zone which may reflect postoperative change or possibly prior radiation therapy.  There is a small area of nodular opacity peripherally in the right lower lobe best seen on image 34 of series 3 measuring approximately 1.0 cm in diameter.  This is most compatible with postoperative change, but attention on follow-up exams would be useful.  Left lung is clear.  Upper abdomen is unremarkable.
IMPRESSION: Postoperative change of the right hemithorax of pulmonary nodule resection for lung carcinoma.  No definite residual or recurrent tumor is seen.  Small opacity in the peripheral aspect of the right lung as described above likely reflects postoperative change, but attention on follow-up exams is recommended.

## 2006-02-14 IMAGING — MR MR [PERSON_NAME] UP JT W/O CM*L*
5 series · 16 of 16 positions shown · IV contrast (agent unspecified)
Comparison: none

CLINICAL DATA: History of lung cancer.  Left shoulder pain.  Question impingement.
MR OF THE LEFT SHOULDER WITHOUT CONTRAST:

[Series 2: PD fat-sat · axial · 4.0mm · 0.27mm/px · z∈[-57,+24]mm · 4 of 19 slices shown (1 of 2)]
[im 1/19]
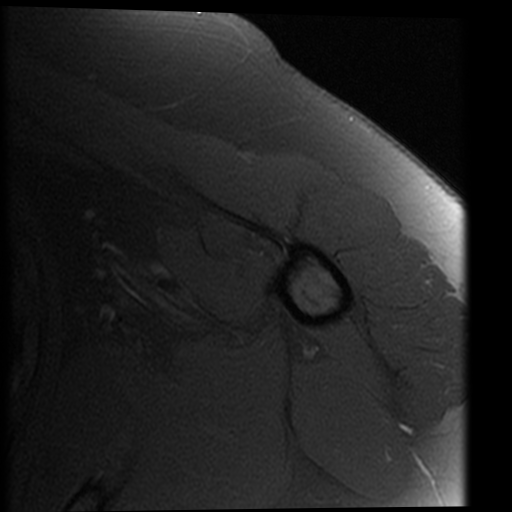
[im 7/19]
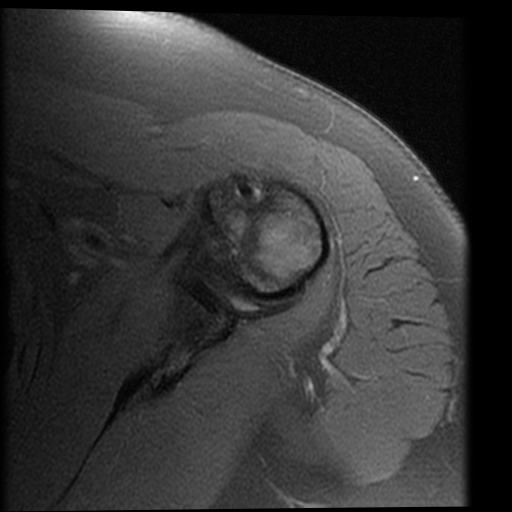
[im 13/19]
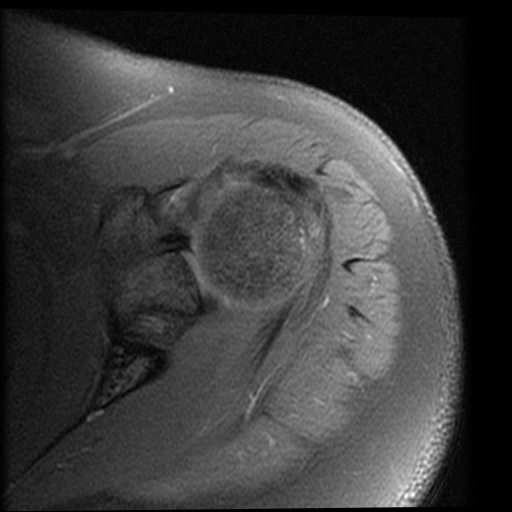
[im 19/19]
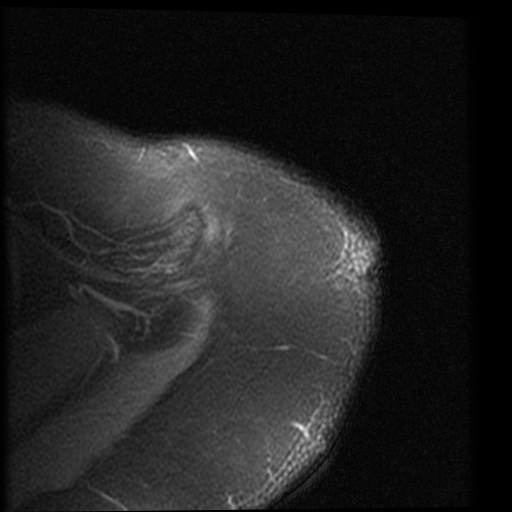

[Series 3: T1 · oblique · 4.0mm · 0.31mm/px · 3 of 16 slices shown]
[im 1/16]
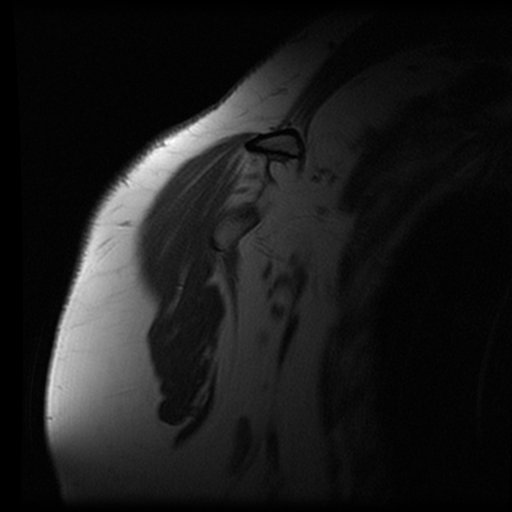
[im 8/16]
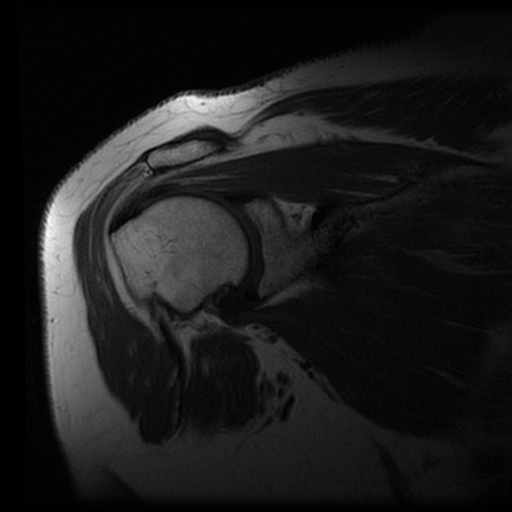
[im 16/16]
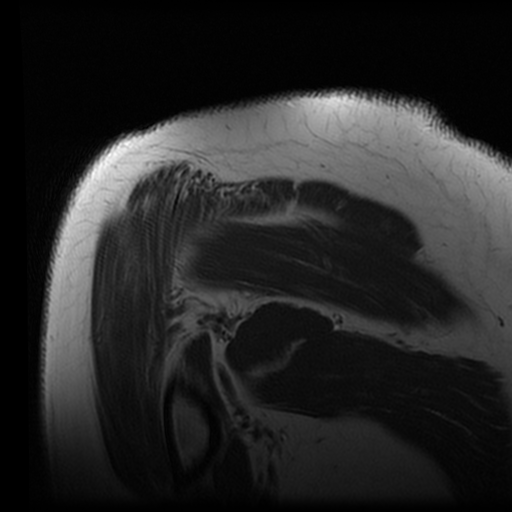

[Series 4: PD fat-sat · oblique · 4.0mm · 0.31mm/px · 3 of 16 slices shown (2 of 2)]
[im 1/16]
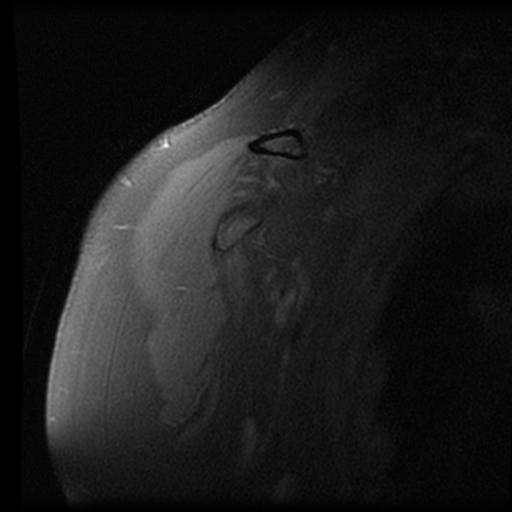
[im 8/16]
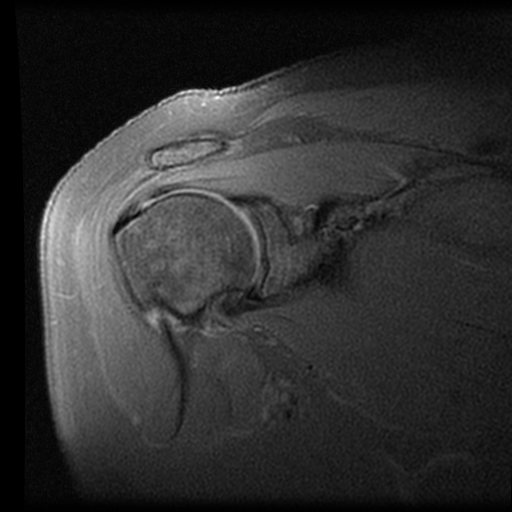
[im 16/16]
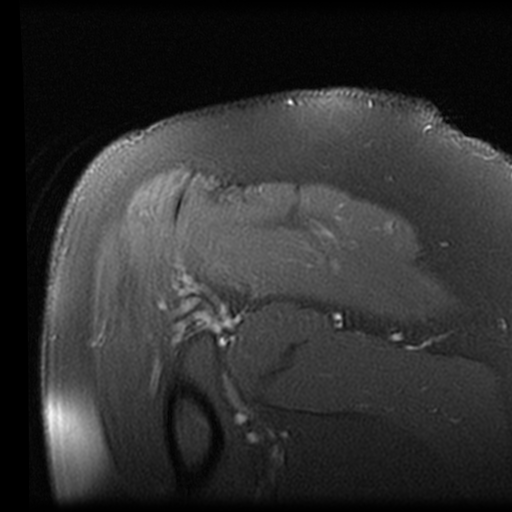

[Series 5: T2 fat-sat · oblique · 4.0mm · 0.31mm/px · 3 of 16 slices shown (1 of 2)]
[im 1/16]
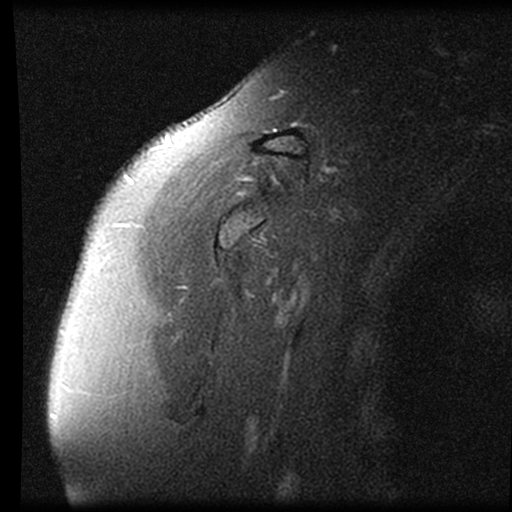
[im 8/16]
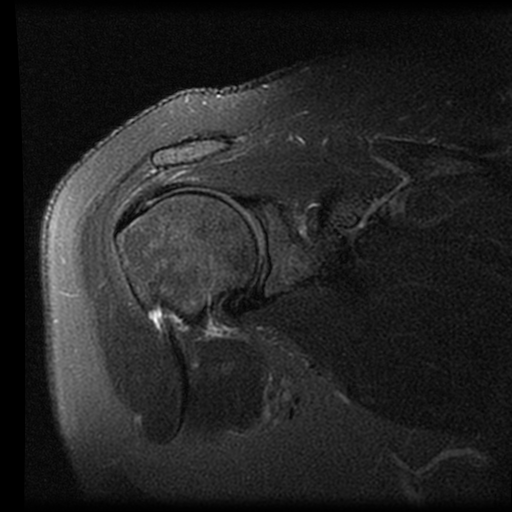
[im 16/16]
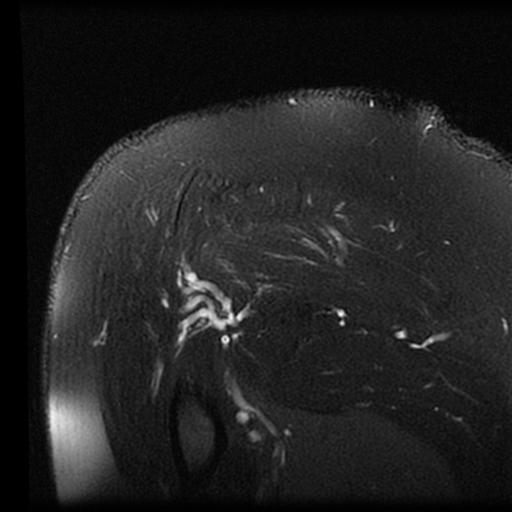

[Series 6: T2 fat-sat · coronal · 4.0mm · 0.31mm/px · 3 of 18 slices shown (2 of 2)]
[im 1/18]
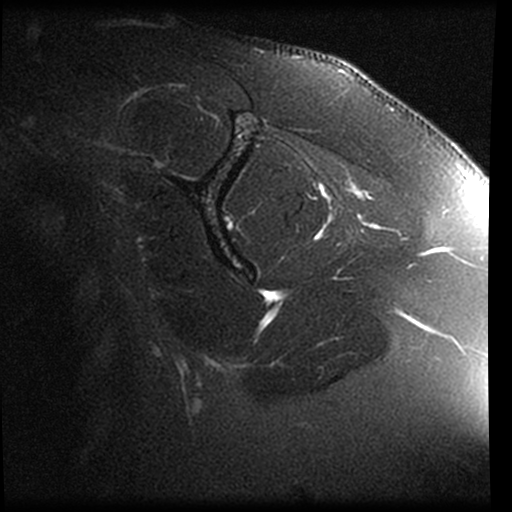
[im 9/18]
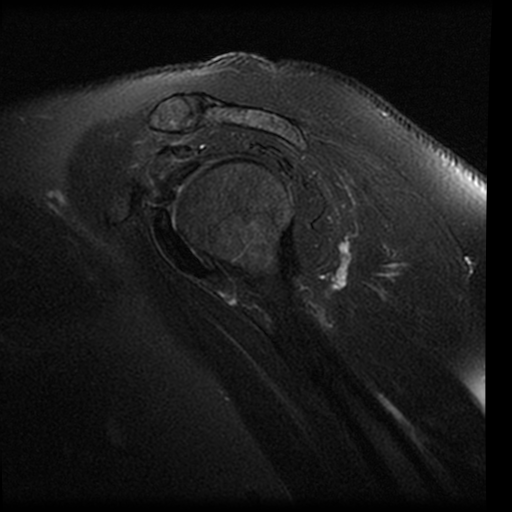
[im 18/18]
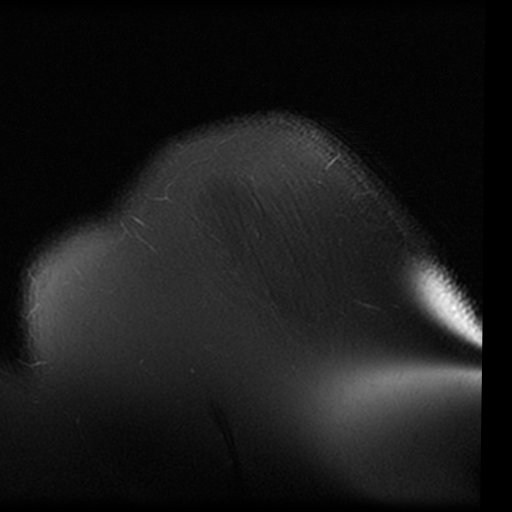

[16 of 16 positions shown; findings below may reference images not displayed]

FINDINGS: No bony destructive lesion to suggest the presence of metastatic disease.  Moderate acromioclavicular joint degenerative changes.  Slight curvature of the anterior aspect of the acromion and lateral downsloping of the acromion contribute to mild narrowing of the passageway of the underlying rotator cuff musculature and tendons.  upraspinatus tendon and infraspinatus tendon mild tendinosis type changes as well as areas of minimal bursal surface and undersurface irregularity but without evidence of a full-thickness rotator cuff tear.
Very minimal irregularity of the superior labrum with the remainder of the labrum appearing intact.  The biceps tendon is located appropriately.
IMPRESSION: 1.  Moderate acromioclavicular joint degenerative changes.  Slight lateral downsloping of the acromion and mild curvature of the anterior aspect of the acromion.  These findings contribute to slight narrowing of the passageway of the underlying rotator cuff musculature and tendons.
2.  Supraspinatus tendon and infraspinatus tendon mild tendinosis type changes with scattered areas of very mild undersurface and bursal surface irregularity but without evidence of full-thickness tear.
3.  Tiny subchondral cystic changes along the posterolateral aspect of the humeral head but without evidence of a bony destructive lesion suspicious for metastatic disease.

## 2006-03-10 ENCOUNTER — Ambulatory Visit (HOSPITAL_COMMUNITY): Admission: RE | Admit: 2006-03-10 | Discharge: 2006-03-10 | Payer: Self-pay | Admitting: Hematology & Oncology

## 2006-04-04 IMAGING — CT CT CHEST W/ CM
1 of 2 series · 14 of 32 positions shown, 18 images · IV contrast (omnipaque)
Comparison: none

CLINICAL DATA: CT WITH CONTRAST:
TECHNIQUE: Multidetector spiral axial images were obtained through the thorax with IV injection of 100 cc Omnipaque 300 and comparison made with previous [REDACTED] chest CT 03/24/04.

[Series 2: — · axial · 0.58mm/px · z∈[-273,-33]mm · 14 of 60 slices shown, 18 images]
[im 5/60  mediastinal]
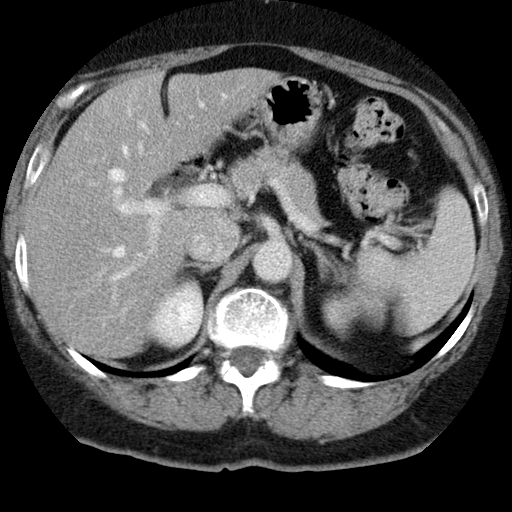
[im 5/60  lung]
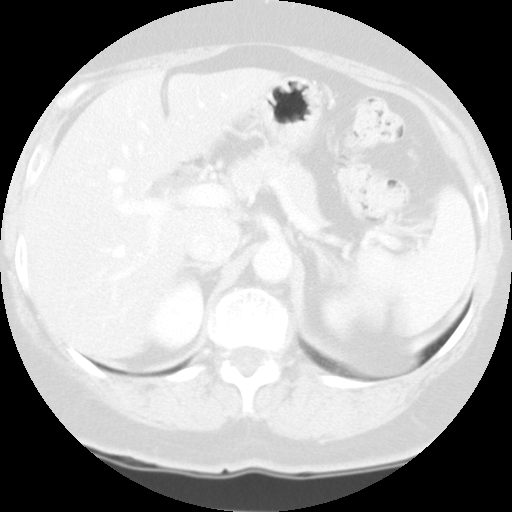
[im 10/60  lung]
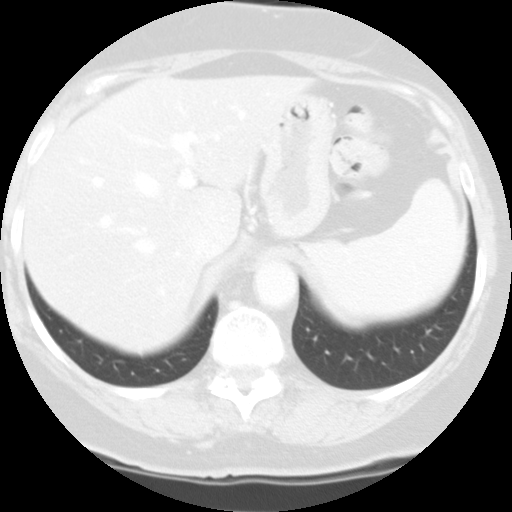
[im 14/60  lung]
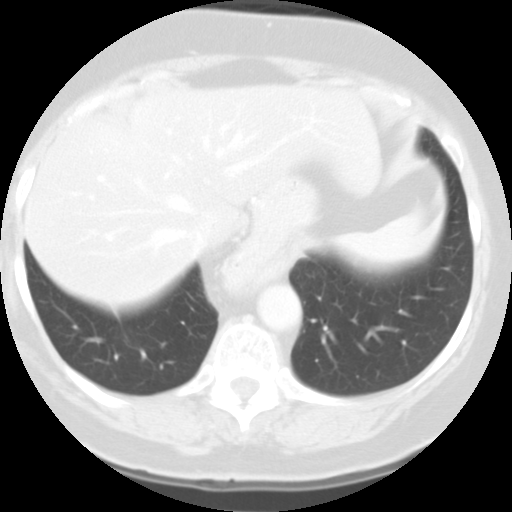
[im 19/60  lung]
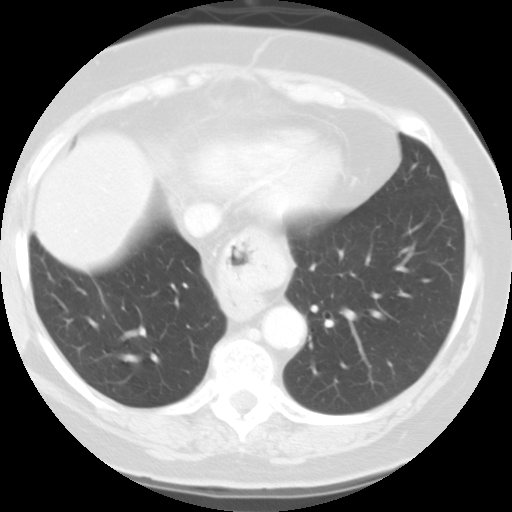
[im 23/60  mediastinal]
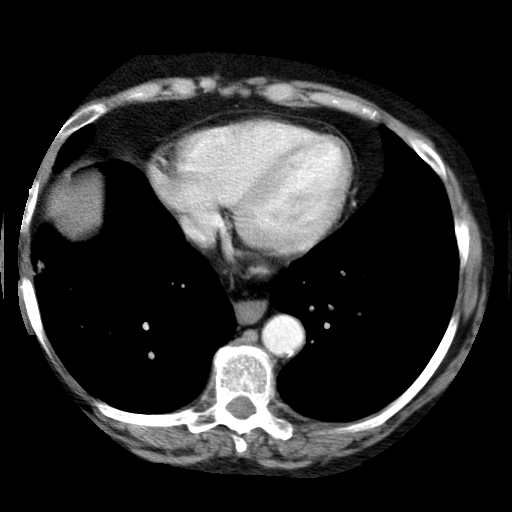
[im 23/60  lung]
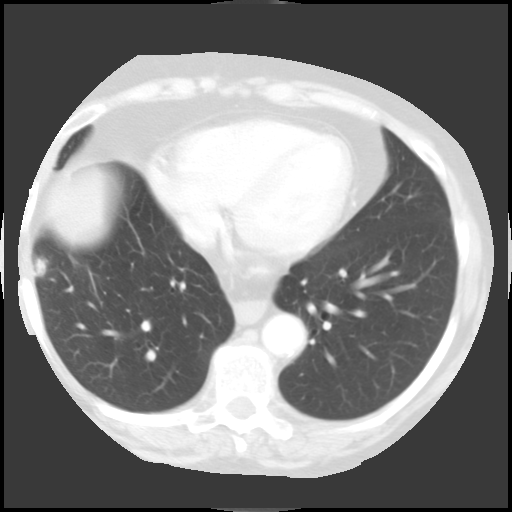
[im 28/60  lung]
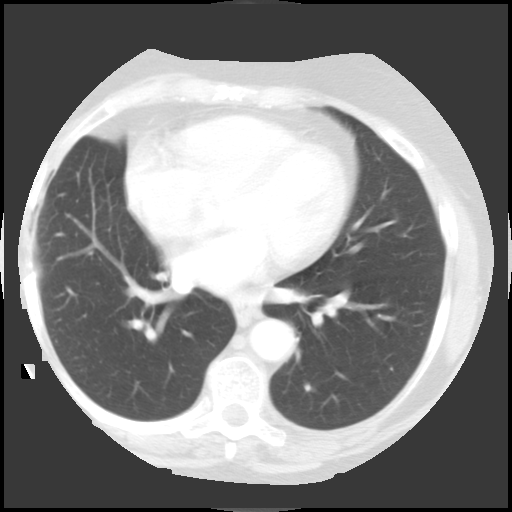
[im 29/60  lung]
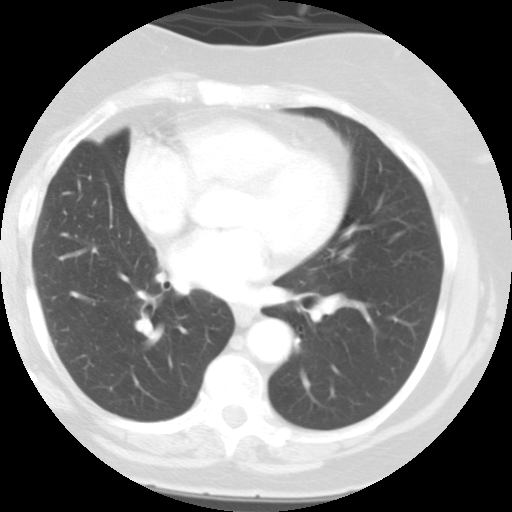
[im 30/60  lung]
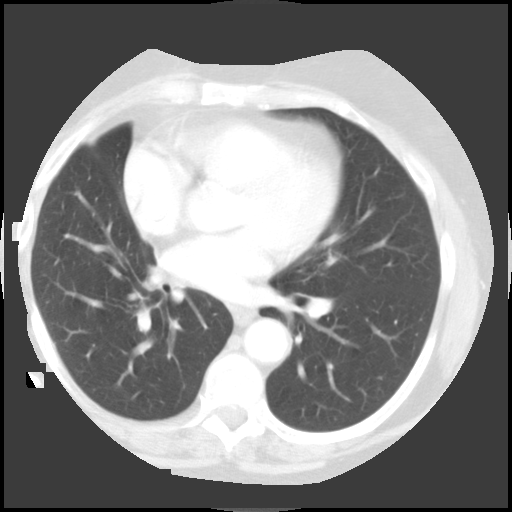
[im 32/60  mediastinal]
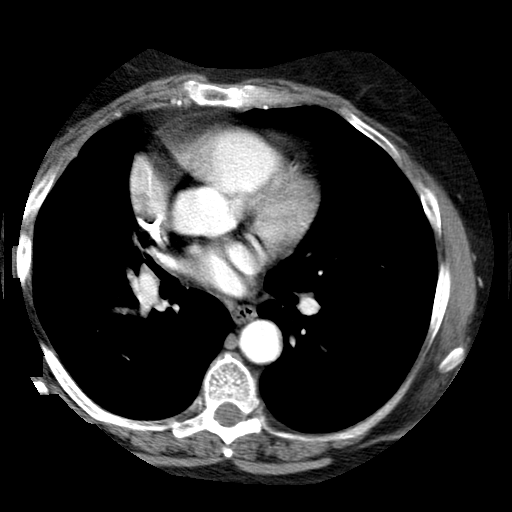
[im 32/60  lung]
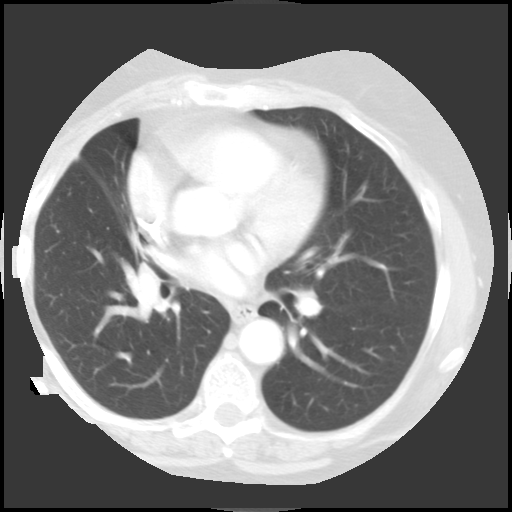
[im 37/60  lung]
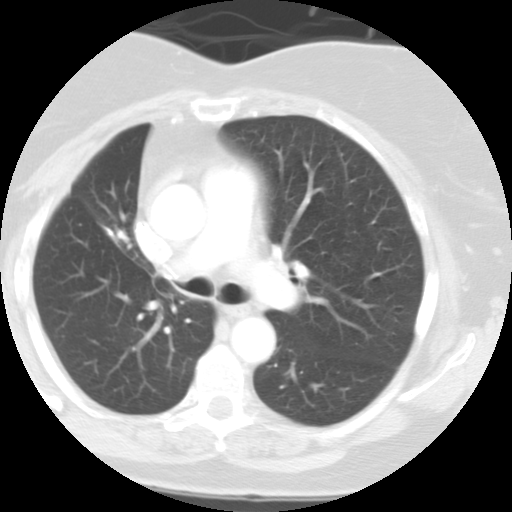
[im 41/60  lung]
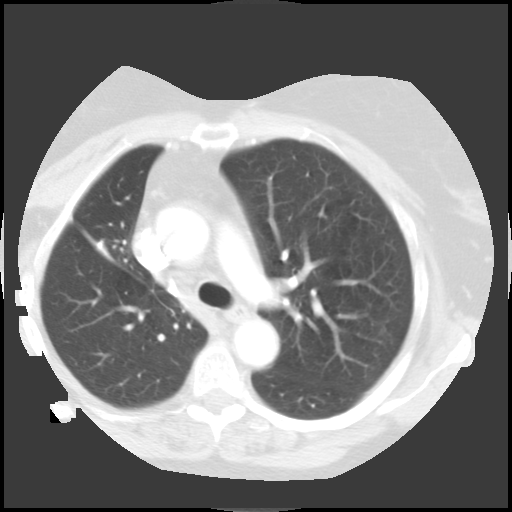
[im 46/60  lung]
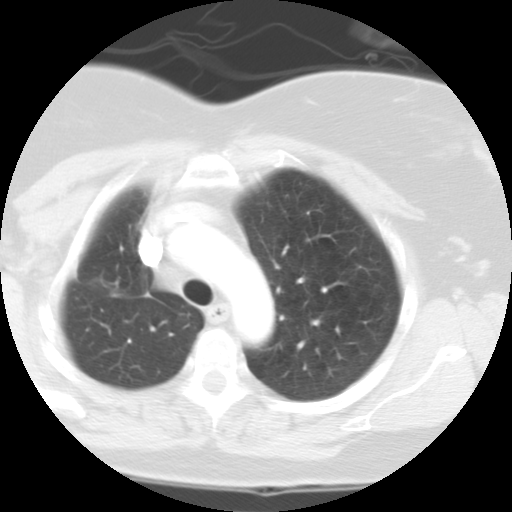
[im 50/60  mediastinal]
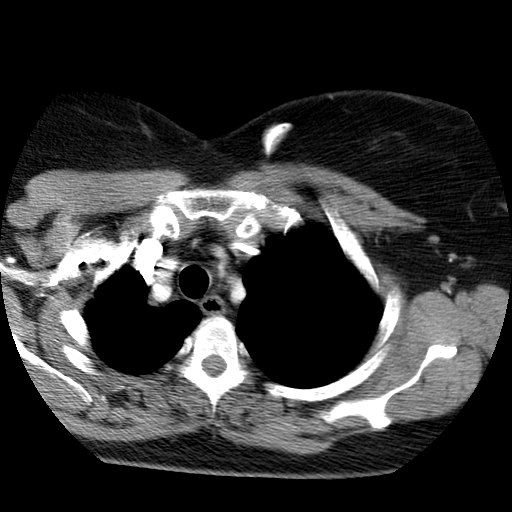
[im 50/60  lung]
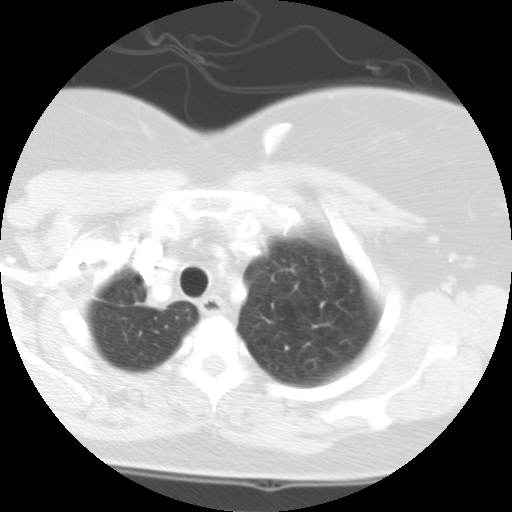
[im 55/60  lung]
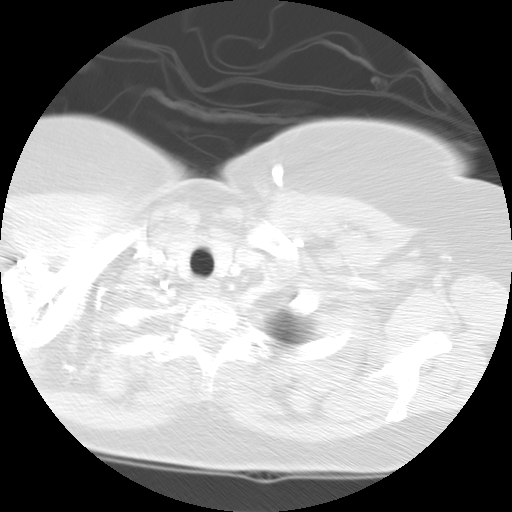

[14 of 32 positions shown; findings below may reference images not displayed]

FINDINGS: Since prior study there is no interval change with patient post right upper lobectomy with slight residual benign-appearing linear fibrosis.  The lungs are otherwise clear.  No interval mediastinal, hilar nor axillary mass/adenopathy is seen.  No osseous metastatic disease is noted with moderate sized hiatus hernia and slight diffuse fatty infiltration of the liver with the upper abdominal organs appearing otherwise normal.   Stable left Port-A-Catheter position is seen.
IMPRESSION: Since [REDACTED] chest x-ray 03/24/04 no interval change:
1.  Post-right upper lobectomy with no locally recurrent nor metastatic lung carcinoma seen.
2.  Moderate hiatus hernia with slight diffuse fatty infiltration of the liver.
3.  Stable left Port-A-Catheter position is seen.

## 2006-05-19 ENCOUNTER — Ambulatory Visit: Payer: Self-pay | Admitting: Hematology & Oncology

## 2006-05-24 LAB — CBC WITH DIFFERENTIAL/PLATELET
BASO%: 1.2 % (ref 0.0–2.0)
EOS%: 1.8 % (ref 0.0–7.0)
LYMPH%: 38.9 % (ref 14.0–48.0)
MCH: 26.9 pg (ref 26.0–34.0)
MCHC: 33.7 g/dL (ref 32.0–36.0)
MONO#: 0.4 10*3/uL (ref 0.1–0.9)
RBC: 4.48 10*6/uL (ref 3.70–5.32)
WBC: 7.4 10*3/uL (ref 3.9–10.0)
lymph#: 2.9 10*3/uL (ref 0.9–3.3)

## 2006-05-24 LAB — COMPREHENSIVE METABOLIC PANEL
ALT: 15 U/L (ref 0–35)
AST: 15 U/L (ref 0–37)
CO2: 24 mEq/L (ref 19–32)
Chloride: 103 mEq/L (ref 96–112)
Creatinine, Ser: 0.65 mg/dL (ref 0.40–1.20)
Sodium: 139 mEq/L (ref 135–145)
Total Bilirubin: 0.4 mg/dL (ref 0.3–1.2)
Total Protein: 7.1 g/dL (ref 6.0–8.3)

## 2006-05-31 LAB — BASIC METABOLIC PANEL
CO2: 26 mEq/L (ref 19–32)
Calcium: 9.1 mg/dL (ref 8.4–10.5)
Chloride: 105 mEq/L (ref 96–112)
Glucose, Bld: 93 mg/dL (ref 70–99)
Sodium: 142 mEq/L (ref 135–145)

## 2006-06-27 IMAGING — CT CT CHEST W/ CM
1 of 2 series · 14 of 29 positions shown, 18 images · IV contrast (omnipaque)
Comparison: 05/20/2004.

CLINICAL DATA: Followup right lung carcinoma.  Recently completed chemotherapy.  Restaging.  
CHEST CT WITH CONTRAST:
TECHNIQUE: Multidetector CT imaging of the chest was performed following the standard protocol during bolus administration of intravenous contrast.
Contrast:  80 cc Omnipaque 300.

[Series 2: chest_routine 5.0 b40f st · axial · 0.71mm/px · z∈[-190,+54]mm · 14 of 59 slices shown, 18 images]
[im 5/59  mediastinal]
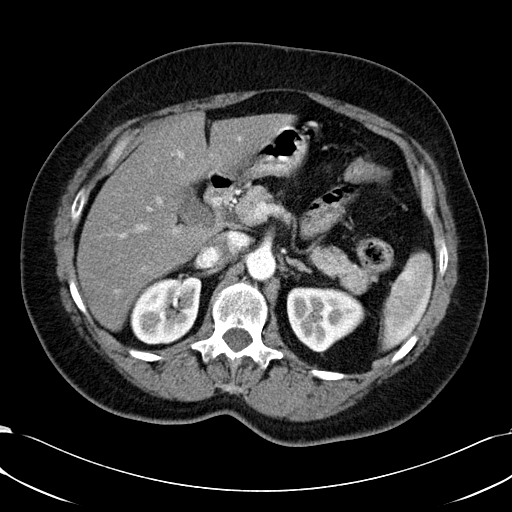
[im 5/59  lung]
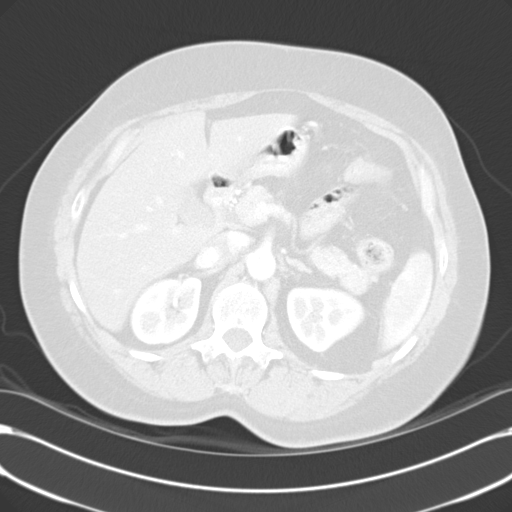
[im 9/59  lung]
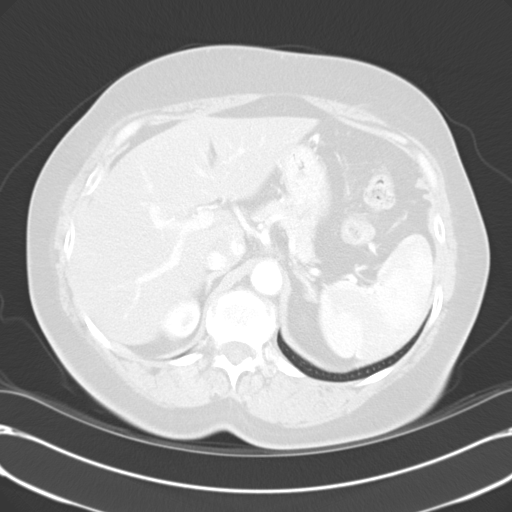
[im 13/59  lung]
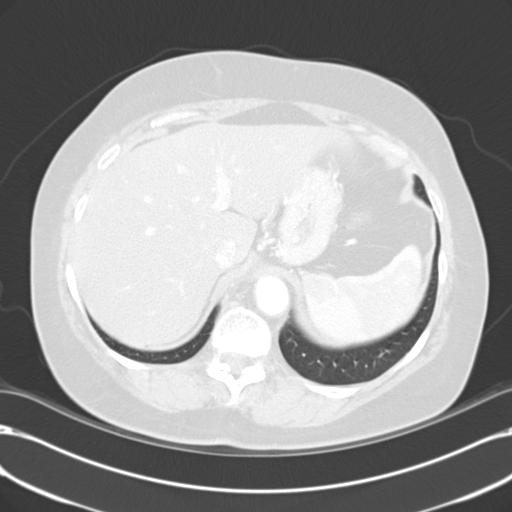
[im 17/59  lung]
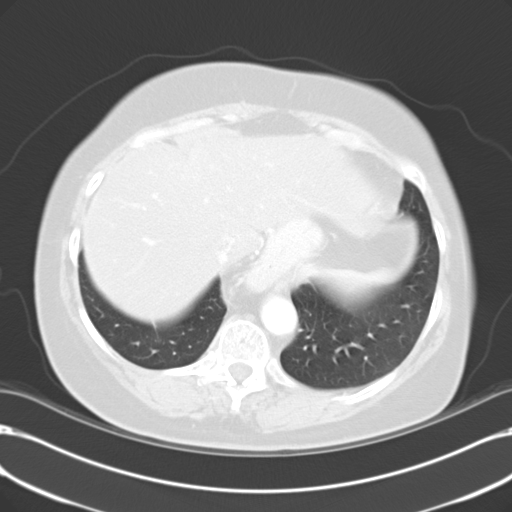
[im 21/59  mediastinal]
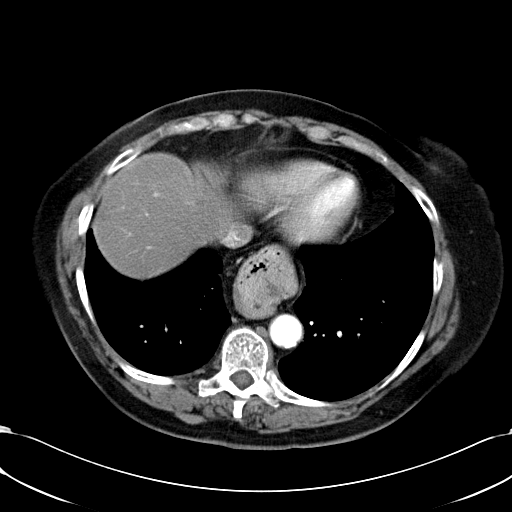
[im 21/59  lung]
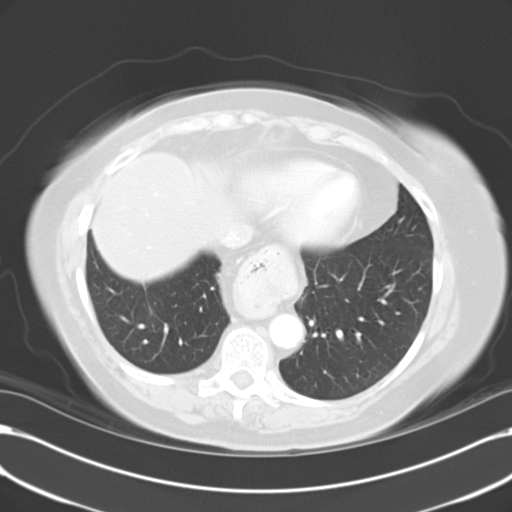
[im 25/59  lung]
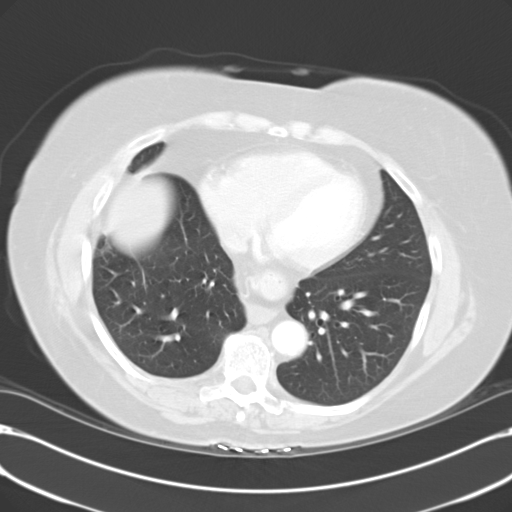
[im 28/59  lung]
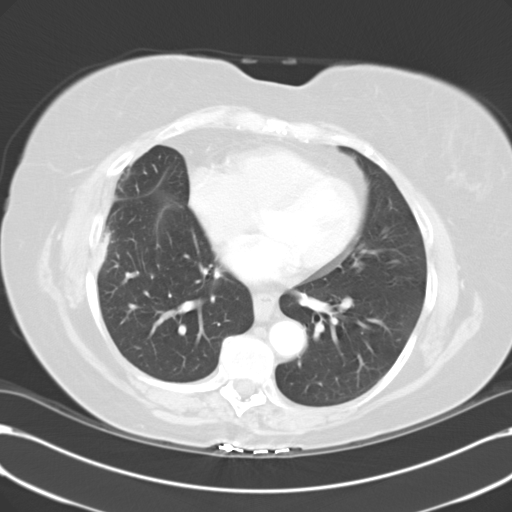
[im 30/59  lung]
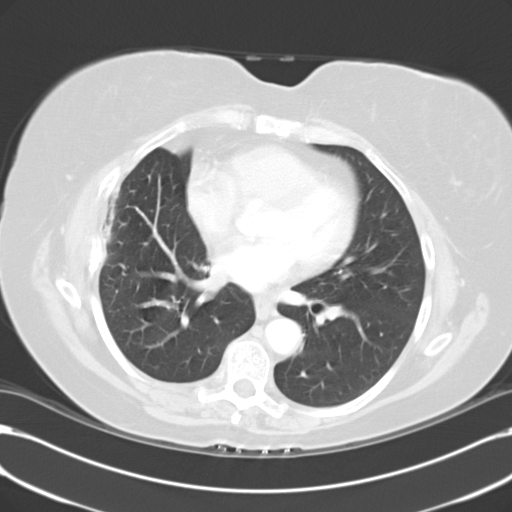
[im 34/59  mediastinal]
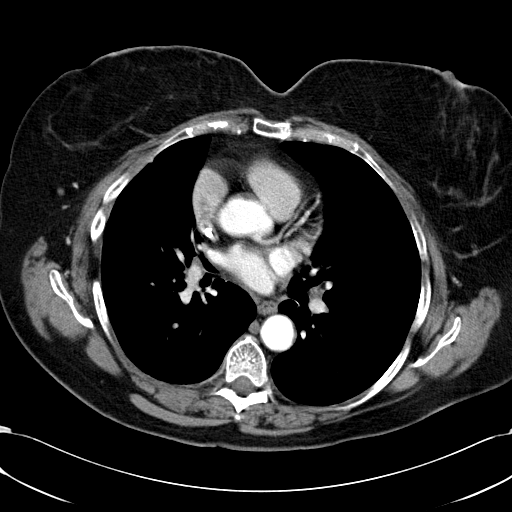
[im 34/59  lung]
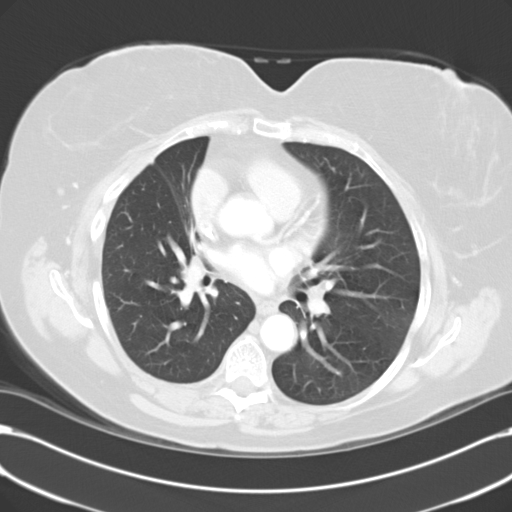
[im 38/59  lung]
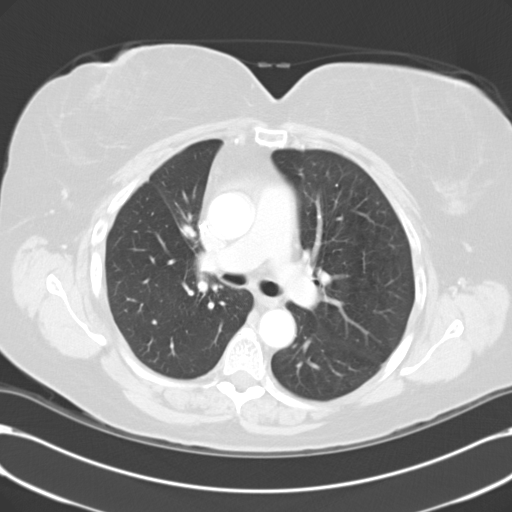
[im 42/59  lung]
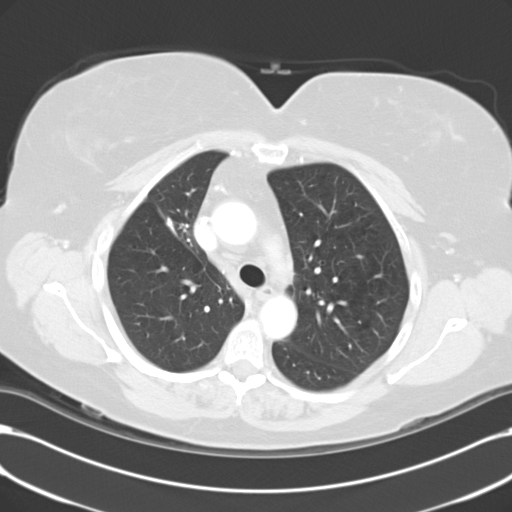
[im 46/59  lung]
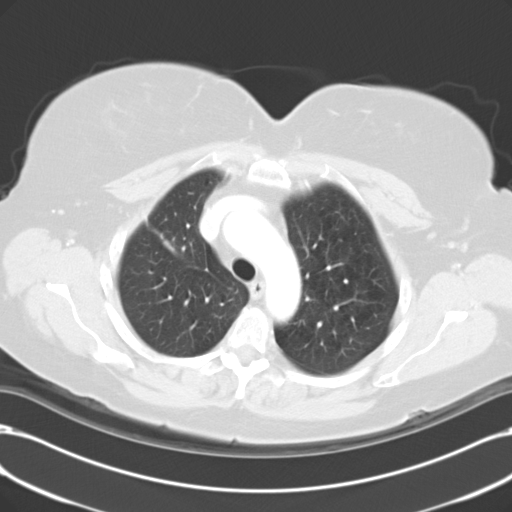
[im 50/59  mediastinal]
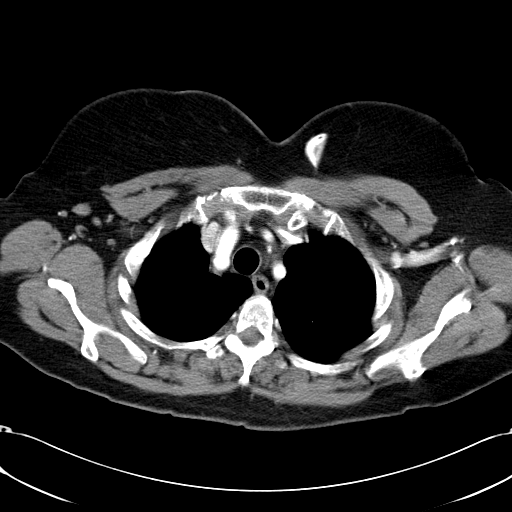
[im 50/59  lung]
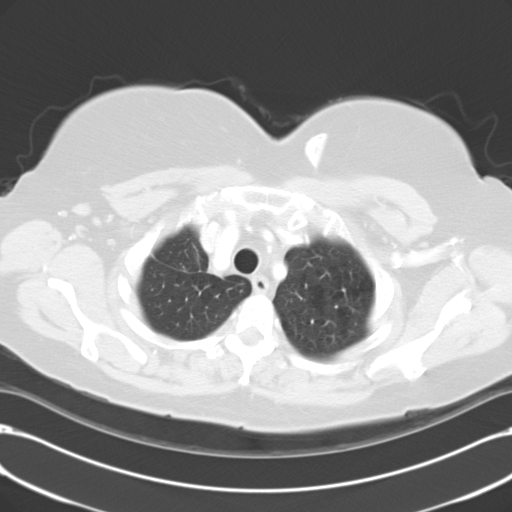
[im 54/59  lung]
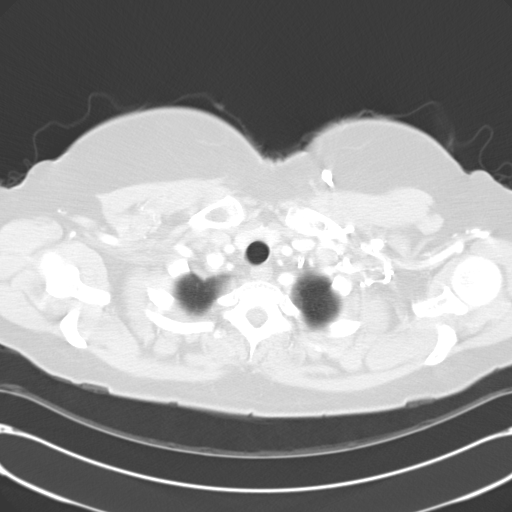

[14 of 29 positions shown; findings below may reference images not displayed]

Post-surgical changes are again seen from previous right thoracotomy and right upper lobectomy.  Mild pleural-parenchymal scarring is seen in the lateral left lung base and is stable.  There is no evidence of pulmonary infiltrate or suspicious masses or nodules.  
There is no evidence of pleural or pericardial effusion.  There is no evidence of hilar or mediastinal adenopathy.  Again noted is a moderate-sized hiatal hernia.  Images obtained through the upper abdomen are unremarkable, except for diffuse fatty infiltration of the liver.
IMPRESSION: 1.  Stable chest CT.  No evidence of recurrent or metastatic tumor.  
2.  Stable moderate-sized hiatal hernia.  Diffuse fatty infiltration of the liver again noted.

## 2006-06-28 LAB — BASIC METABOLIC PANEL
BUN: 16 mg/dL (ref 6–23)
CO2: 25 mEq/L (ref 19–32)
Calcium: 9.4 mg/dL (ref 8.4–10.5)
Creatinine, Ser: 0.7 mg/dL (ref 0.40–1.20)

## 2006-07-24 ENCOUNTER — Ambulatory Visit: Payer: Self-pay | Admitting: Hematology & Oncology

## 2006-08-15 ENCOUNTER — Ambulatory Visit (HOSPITAL_COMMUNITY): Admission: RE | Admit: 2006-08-15 | Discharge: 2006-08-15 | Payer: Self-pay | Admitting: Hematology & Oncology

## 2006-09-18 ENCOUNTER — Ambulatory Visit: Payer: Self-pay | Admitting: Hematology & Oncology

## 2006-09-21 LAB — COMPREHENSIVE METABOLIC PANEL
BUN: 16 mg/dL (ref 6–23)
CO2: 25 mEq/L (ref 19–32)
Creatinine, Ser: 0.68 mg/dL (ref 0.40–1.20)
Glucose, Bld: 103 mg/dL — ABNORMAL HIGH (ref 70–99)
Total Bilirubin: 0.3 mg/dL (ref 0.3–1.2)

## 2006-09-21 LAB — CBC WITH DIFFERENTIAL/PLATELET
Basophils Absolute: 0.1 10*3/uL (ref 0.0–0.1)
HGB: 11.8 g/dL (ref 11.6–15.9)
LYMPH%: 44.1 % (ref 14.0–48.0)
MCH: 27.8 pg (ref 26.0–34.0)
MCHC: 34.5 g/dL (ref 32.0–36.0)
RBC: 4.26 10*6/uL (ref 3.70–5.32)
RDW: 16.2 % — ABNORMAL HIGH (ref 11.3–14.5)
lymph#: 3.2 10*3/uL (ref 0.9–3.3)

## 2006-09-26 IMAGING — CT CT CHEST W/ CM
1 of 2 series · 14 of 29 positions shown, 18 images · IV contrast (omnipaque)
Comparison: 08/13/04.

CLINICAL DATA: Lung cancer. 
 CHEST CT WITH CONTRAST - 11/11/04 AT 6171 HOURS:
TECHNIQUE: Multidetector CT imaging of the chest was performed following the standard protocol during bolus administration of intravenous contrast.
 Contrast:  100 cc Omnipaque 300.

[Series 2: chest routine 5.0 b30f · axial · 0.62mm/px · z∈[-278,-38]mm · 14 of 56 slices shown, 18 images]
[im 4/56  mediastinal]
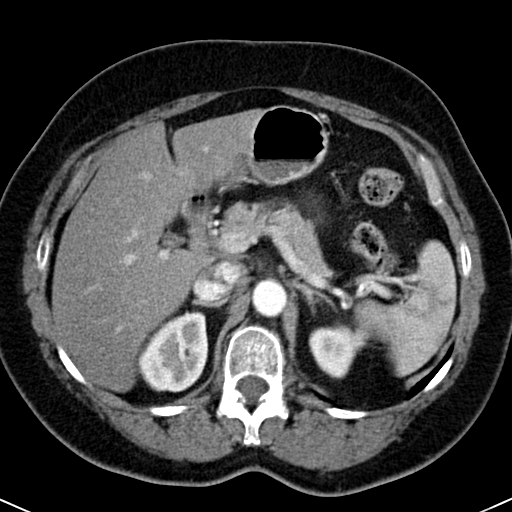
[im 4/56  lung]
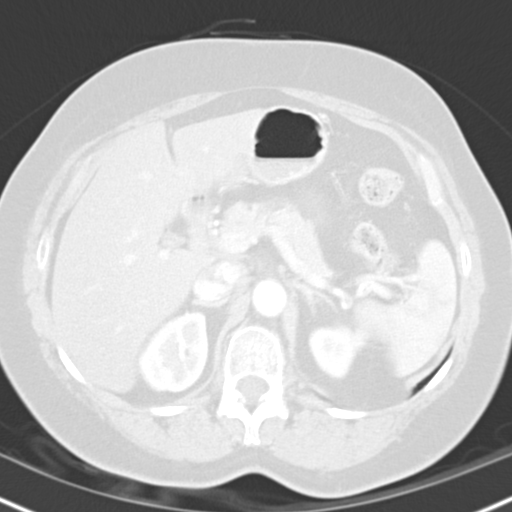
[im 8/56  lung]
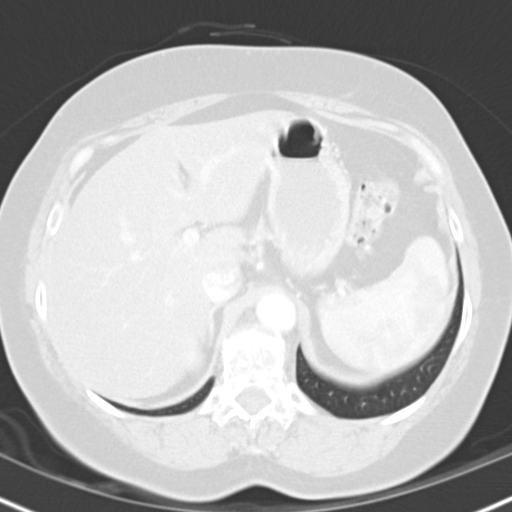
[im 12/56  lung]
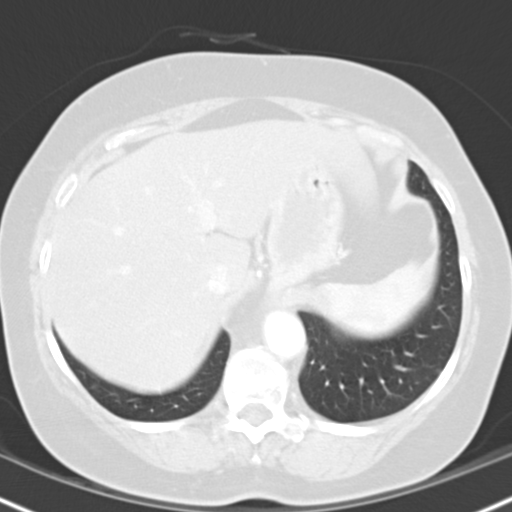
[im 16/56  lung]
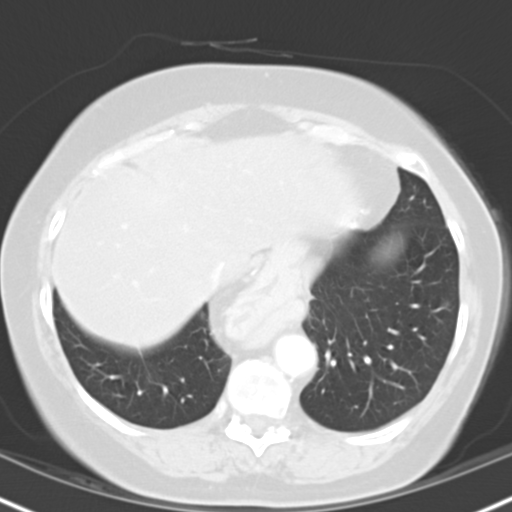
[im 20/56  mediastinal]
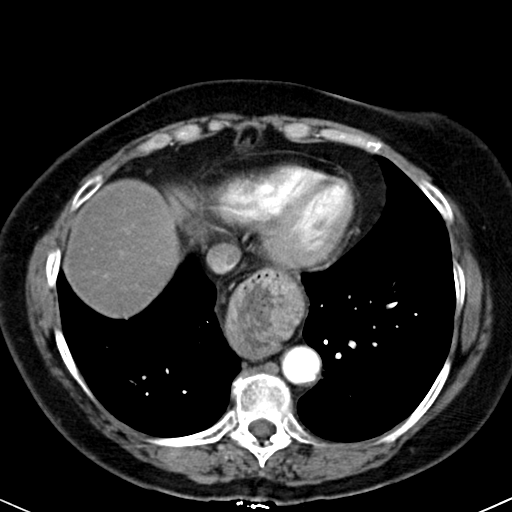
[im 20/56  lung]
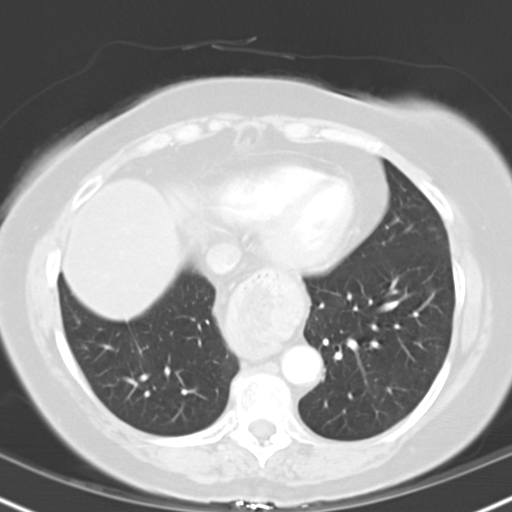
[im 24/56  lung]
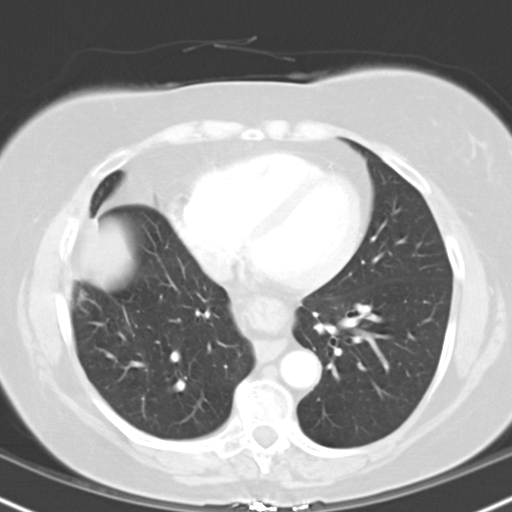
[im 27/56  lung]
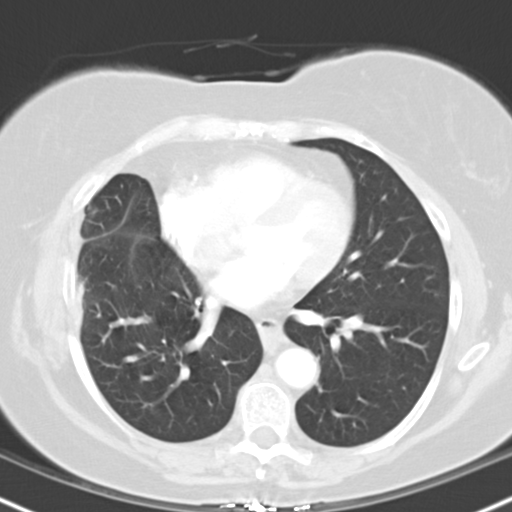
[im 28/56  lung]
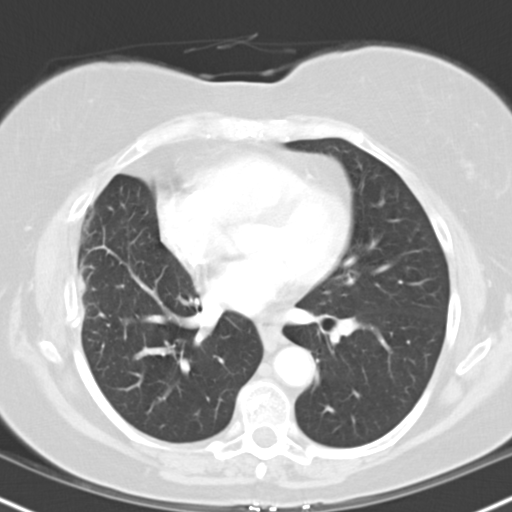
[im 32/56  mediastinal]
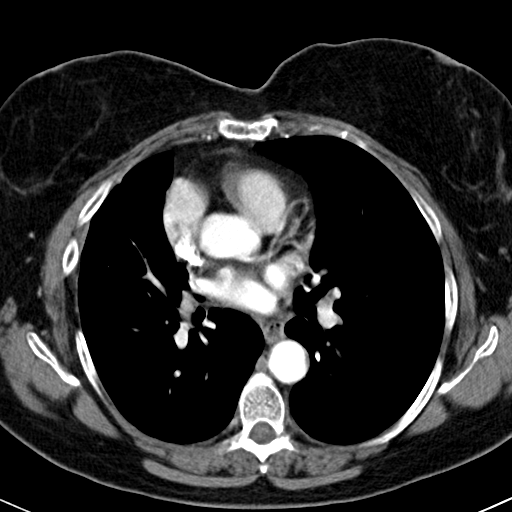
[im 32/56  lung]
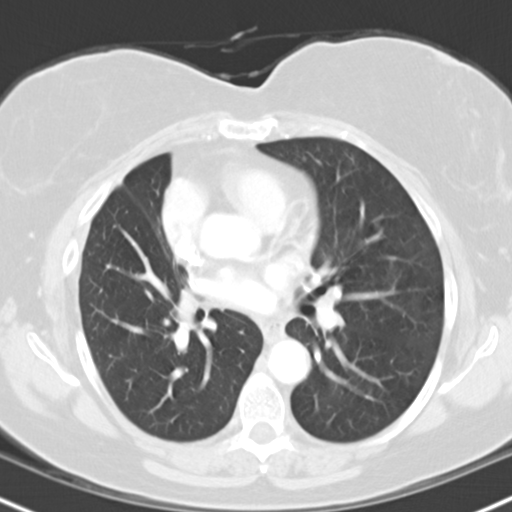
[im 36/56  lung]
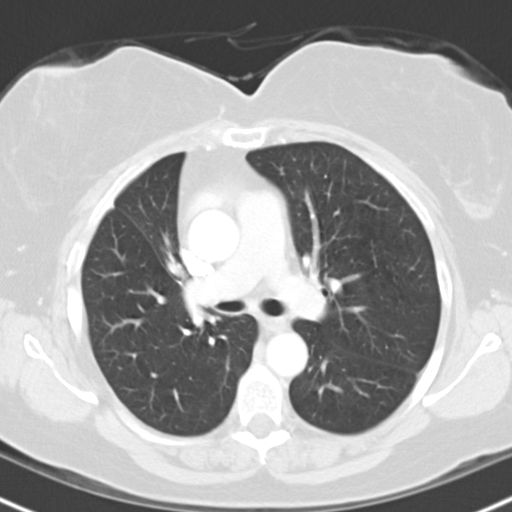
[im 40/56  lung]
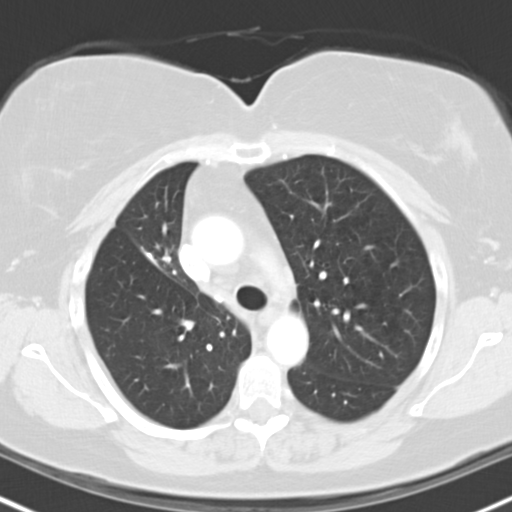
[im 44/56  lung]
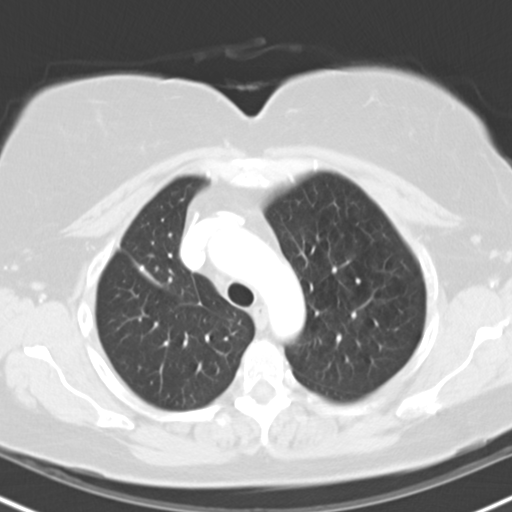
[im 48/56  mediastinal]
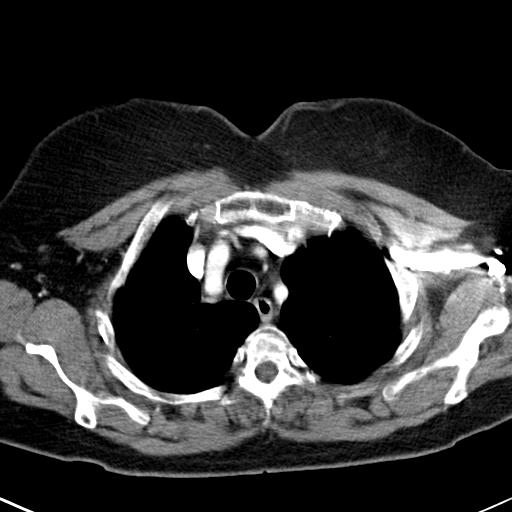
[im 48/56  lung]
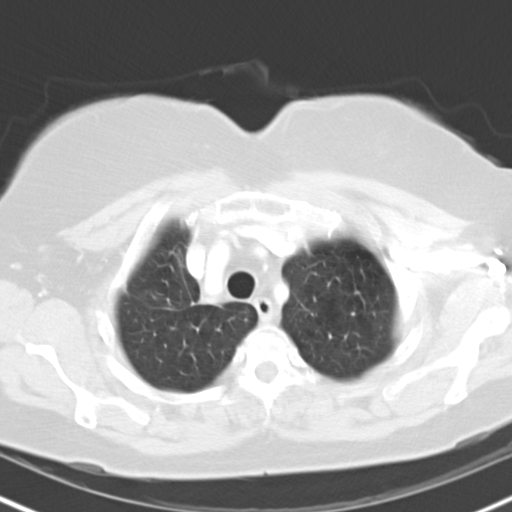
[im 52/56  lung]
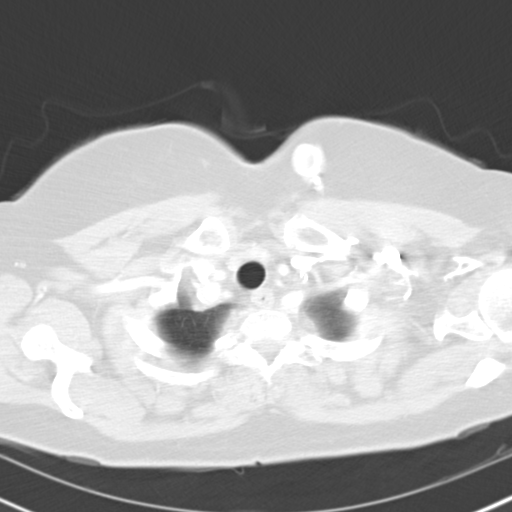

[14 of 29 positions shown; findings below may reference images not displayed]

FINDINGS: Negative abnormal mediastinal, axillary, or hilar adenopathy.  Atherosclerotic vascular disease is noted.  The large hiatal hernia is stable. 
 No pneumothoraces or effusions are seen.  The lungs are stable with pleural and parenchymal scarring at the right lung base and involving the right major fissure. 
 Diffuse fatty infiltration of the liver is unchanged.  The adrenal glands are within normal limits. 
 The Port-A-Cath device is within the soft tissues of the left upper chest and is oriented horizontally, such that the entry site is directed towards the midline.  This is a suboptimal position for the Port-A-Cath device.
IMPRESSION: 1.  No interval change.
 2.  No evidence of metastatic disease or recurrence.

## 2006-11-03 ENCOUNTER — Ambulatory Visit: Payer: Self-pay | Admitting: Hematology & Oncology

## 2006-11-07 LAB — BASIC METABOLIC PANEL
CO2: 21 mEq/L (ref 19–32)
Chloride: 105 mEq/L (ref 96–112)
Glucose, Bld: 77 mg/dL (ref 70–99)
Potassium: 4.1 mEq/L (ref 3.5–5.3)
Sodium: 140 mEq/L (ref 135–145)

## 2006-12-14 ENCOUNTER — Ambulatory Visit (HOSPITAL_COMMUNITY): Admission: RE | Admit: 2006-12-14 | Discharge: 2006-12-14 | Payer: Self-pay | Admitting: Hematology & Oncology

## 2006-12-26 ENCOUNTER — Ambulatory Visit: Payer: Self-pay | Admitting: Hematology & Oncology

## 2006-12-26 ENCOUNTER — Ambulatory Visit (HOSPITAL_COMMUNITY): Admission: RE | Admit: 2006-12-26 | Discharge: 2006-12-26 | Payer: Self-pay | Admitting: Hematology & Oncology

## 2006-12-28 LAB — CBC WITH DIFFERENTIAL/PLATELET
BASO%: 1.1 % (ref 0.0–2.0)
HCT: 38.4 % (ref 34.8–46.6)
LYMPH%: 39.6 % (ref 14.0–48.0)
MCH: 27.5 pg (ref 26.0–34.0)
MCHC: 34 g/dL (ref 32.0–36.0)
MONO#: 0.4 10*3/uL (ref 0.1–0.9)
NEUT%: 52.4 % (ref 39.6–76.8)
Platelets: 262 10*3/uL (ref 145–400)
WBC: 8.4 10*3/uL (ref 3.9–10.0)

## 2006-12-28 LAB — FERRITIN: Ferritin: 25 ng/mL (ref 10–291)

## 2006-12-28 LAB — CHCC SMEAR

## 2007-01-02 ENCOUNTER — Ambulatory Visit: Payer: Self-pay | Admitting: Cardiothoracic Surgery

## 2007-01-10 ENCOUNTER — Ambulatory Visit: Payer: Self-pay | Admitting: Cardiothoracic Surgery

## 2007-01-10 ENCOUNTER — Inpatient Hospital Stay (HOSPITAL_COMMUNITY): Admission: RE | Admit: 2007-01-10 | Discharge: 2007-01-15 | Payer: Self-pay | Admitting: Cardiothoracic Surgery

## 2007-01-10 ENCOUNTER — Encounter: Payer: Self-pay | Admitting: Cardiothoracic Surgery

## 2007-01-25 ENCOUNTER — Ambulatory Visit: Payer: Self-pay | Admitting: Cardiothoracic Surgery

## 2007-01-25 ENCOUNTER — Encounter: Admission: RE | Admit: 2007-01-25 | Discharge: 2007-01-25 | Payer: Self-pay | Admitting: Cardiothoracic Surgery

## 2007-01-28 IMAGING — CT CT CHEST W/ CM
3 of 8 series · 16 of 36 positions shown, 19 images · IV contrast (omnipaque)
Comparison: 11/12/03.
CHEST CT WITH CONTRAST:

CLINICAL DATA: Lung cancer.  Right neck swelling for two months.
TECHNIQUE: Multidetector CT imaging of the chest was performed following the standard protocol during bolus administration of intravenous contrast.
Contrast:  100 cc Omnipaque 300.
TECHNIQUE: Multidetector CT imaging of the neck was performed following the standard protocol during administration of intravenous contrast.

[Series 4: thin sctions 2.0 b40f st · axial · 0.59mm/px · z∈[-443,-173]mm · 12 of 455 slices shown, 15 images]
[im 35/455  mediastinal]
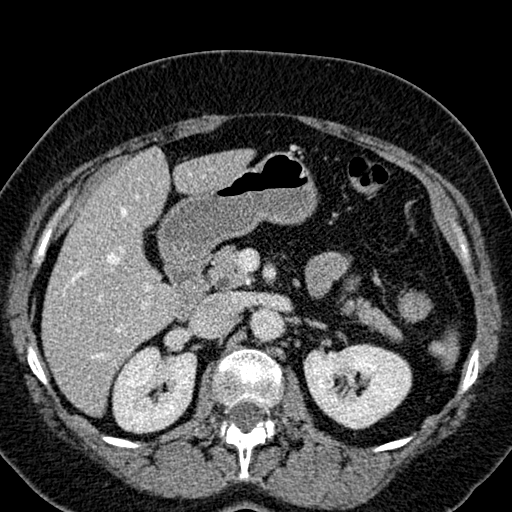
[im 35/455  lung]
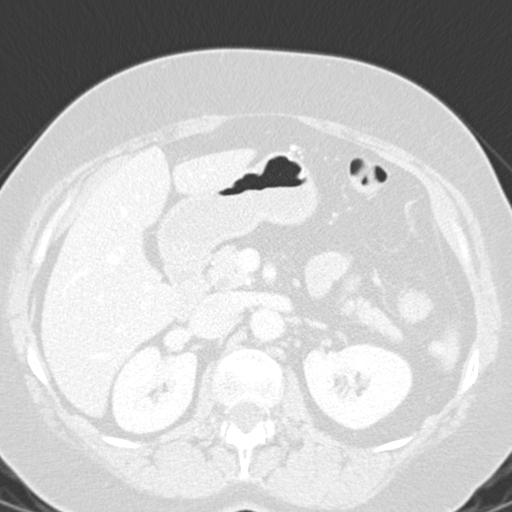
[im 70/455  lung]
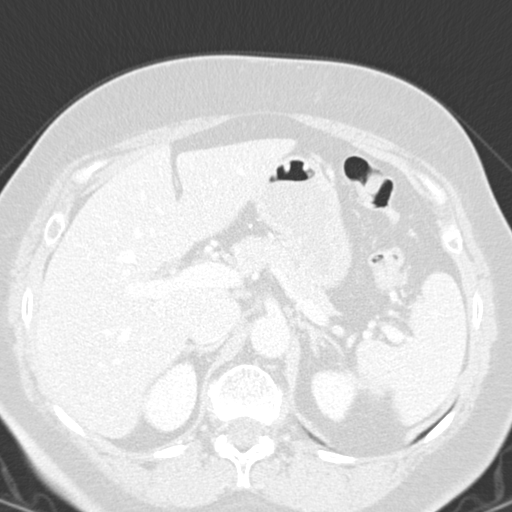
[im 105/455  lung]
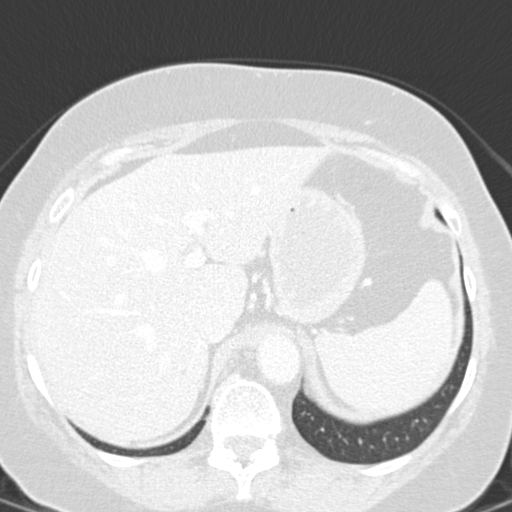
[im 140/455  lung]
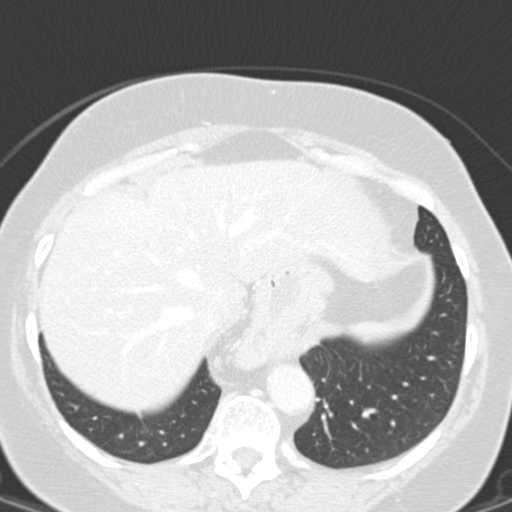
[im 175/455  mediastinal]
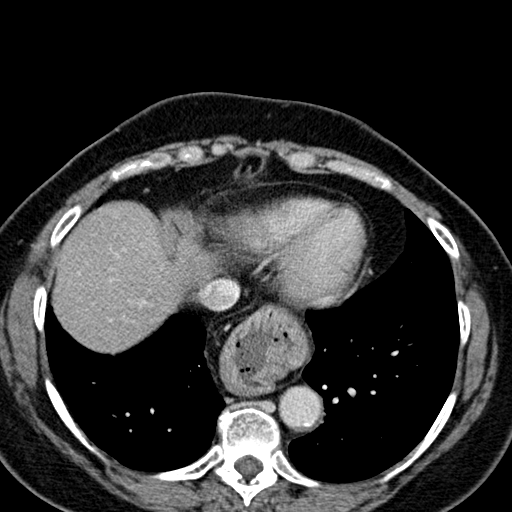
[im 175/455  lung]
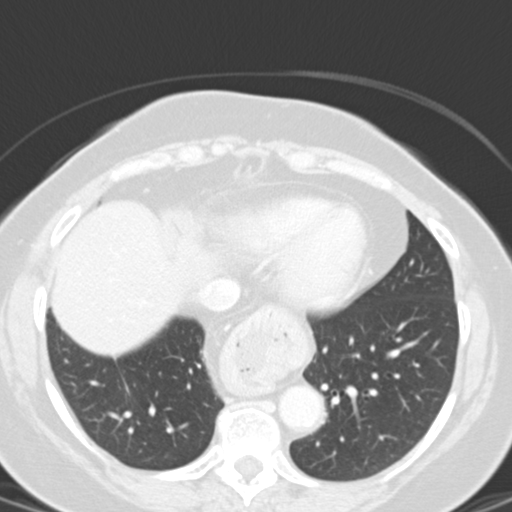
[im 210/455  lung]
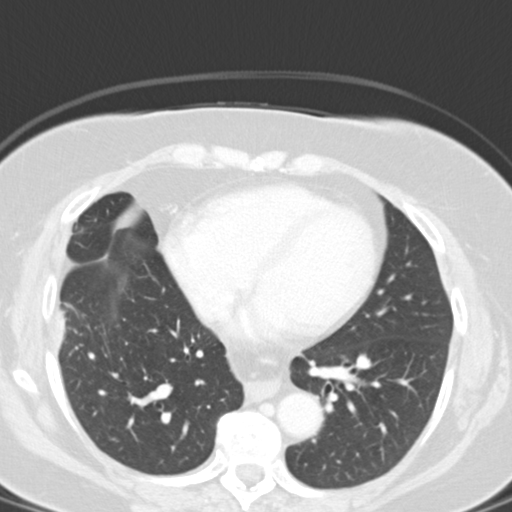
[im 245/455  lung]
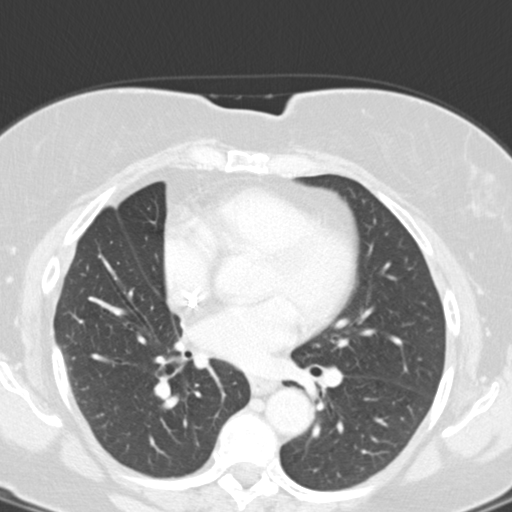
[im 280/455  lung]
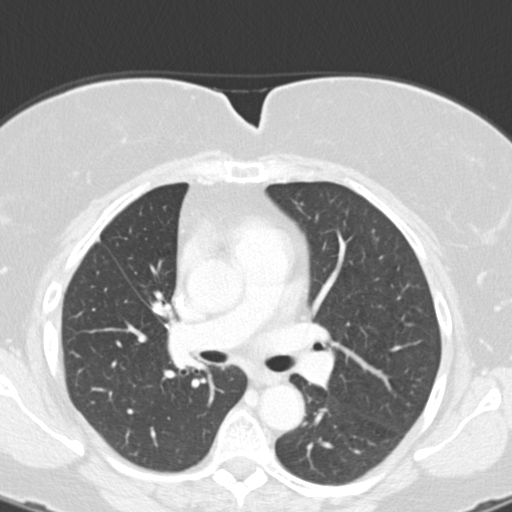
[im 315/455  mediastinal]
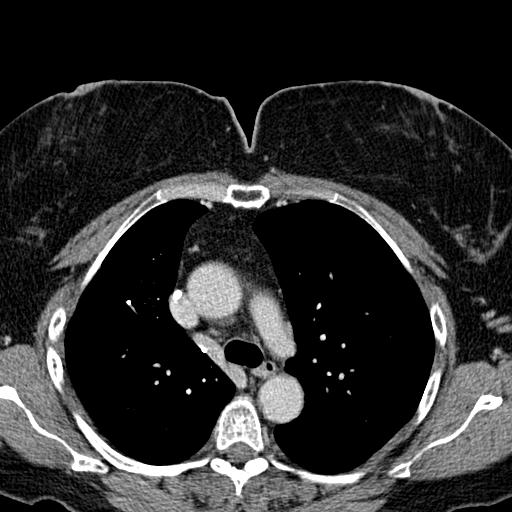
[im 315/455  lung]
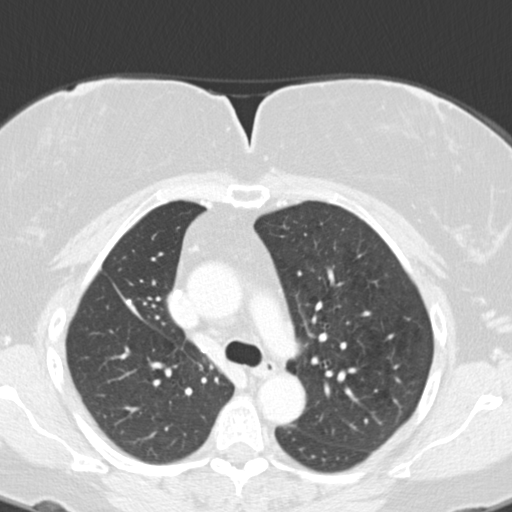
[im 350/455  lung]
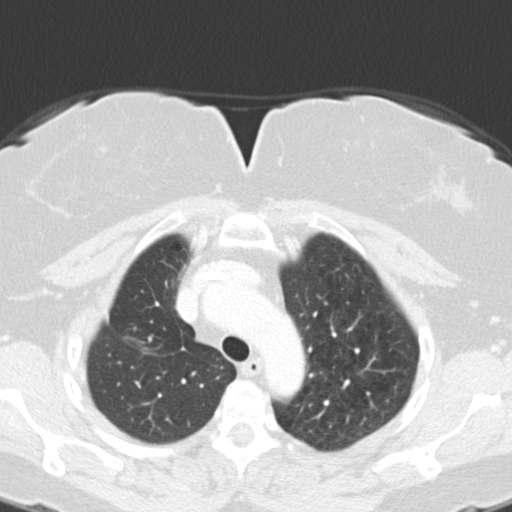
[im 385/455  lung]
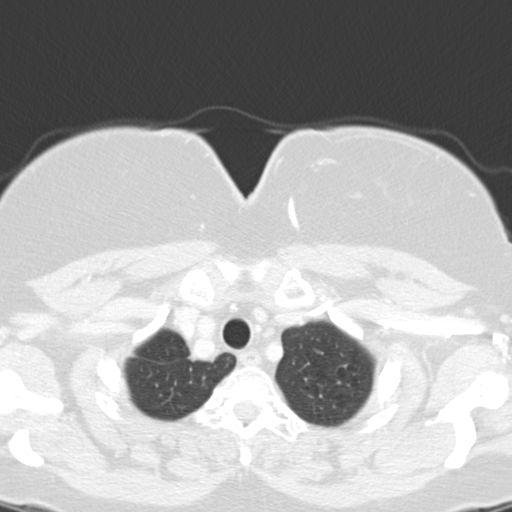
[im 420/455  lung]
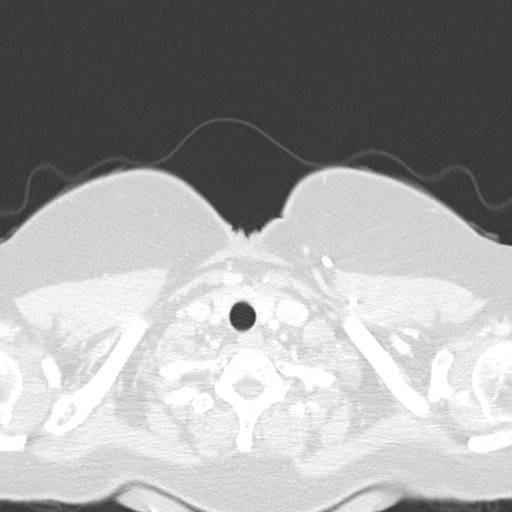

[Series 6: thin section (id) b40s st · axial · 0.39mm/px · z∈[-198,-149]mm · 3 of 310 slices shown]
[im 35/310  lung]
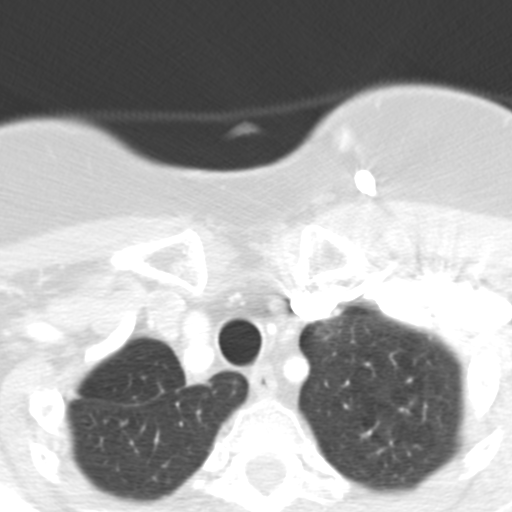
[im 69/310  lung]
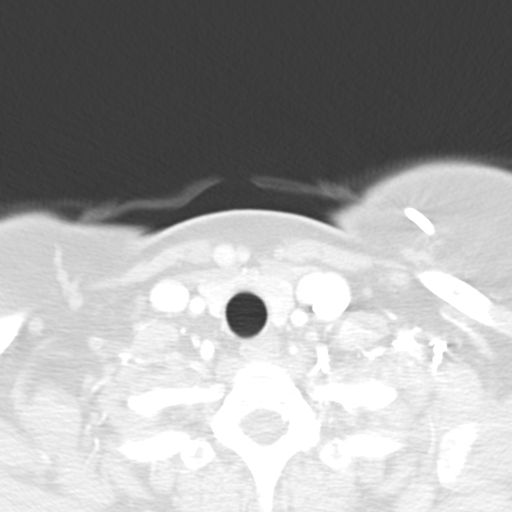
[im 104/310  lung]
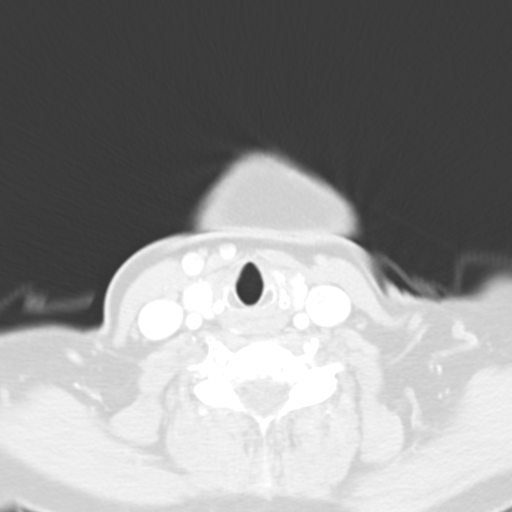

[Series 602: coronal · coronal · 0.62mm/px · 1 of 58 slices shown]
[im 29/58  lung]
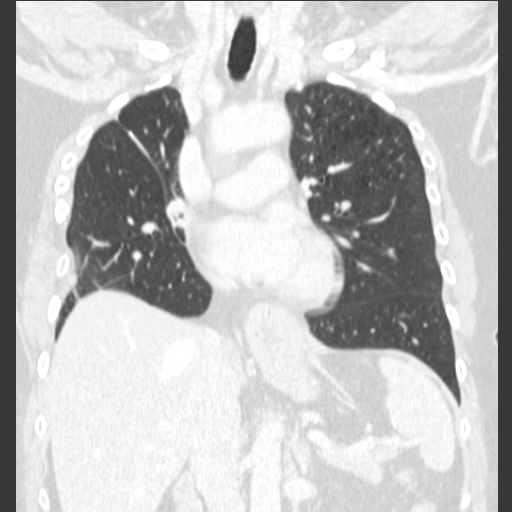

[16 of 36 positions shown; findings below may reference images not displayed]

FINDINGS: There is some minimal scarring in the right hemithorax from the prior surgery.  There is no adenopathy, mass, infiltrate, or effusion.  No discrete bony abnormality.  No change since the prior exam.
IMPRESSION: Stable appearance of the chest with no evidence of metastatic disease or other significant abnormality.
NECK CT WITH CONTRAST:
FINDINGS: There is no evidence of adenopathy, edema, mass lesions, or other significant abnormality.  The thyroid gland appears normal.  There are degenerative spurs at C4-5 and C5-6 of the cervical spine. 
There is a 14 x 10 mm radiodense object seen in the patient?s mouth under the left side of the tongue.  I suspect this is some sort of lozenge or candy rather than an enhancing mass, I suggest a visual inspection of the left side of the tongue along the inner aspect of the left side of the mandible to ensure there is not an unusual lesion in that area.  
The remainder of the study is normal.
IMPRESSION: 1.  Dense mass-like abnormality lying in the gutter adjacent to the left side of the tongue and adjacent to the inner table of the left side of the mandible.  I suspect this is a lozenge or candy rather than an enhancing mass, but the margin is not well defined and the possibility of an unusual enhancing mass shoud  be ruled out by visual inspection.  
2.  Otherwise, normal CT scan of the neck.

## 2007-02-09 ENCOUNTER — Ambulatory Visit: Payer: Self-pay | Admitting: Cardiothoracic Surgery

## 2007-02-13 IMAGING — CR DG CHEST 2V
2 series · 2 of 2 positions shown · non-contrast
Comparison: 01/08/04.

CLINICAL DATA: Lung carcinoma.  Preop for port-a-cath removal.  Left-sided chest pain.  
 CHEST - 2 VIEW:

[view not recorded (1 of 2)]
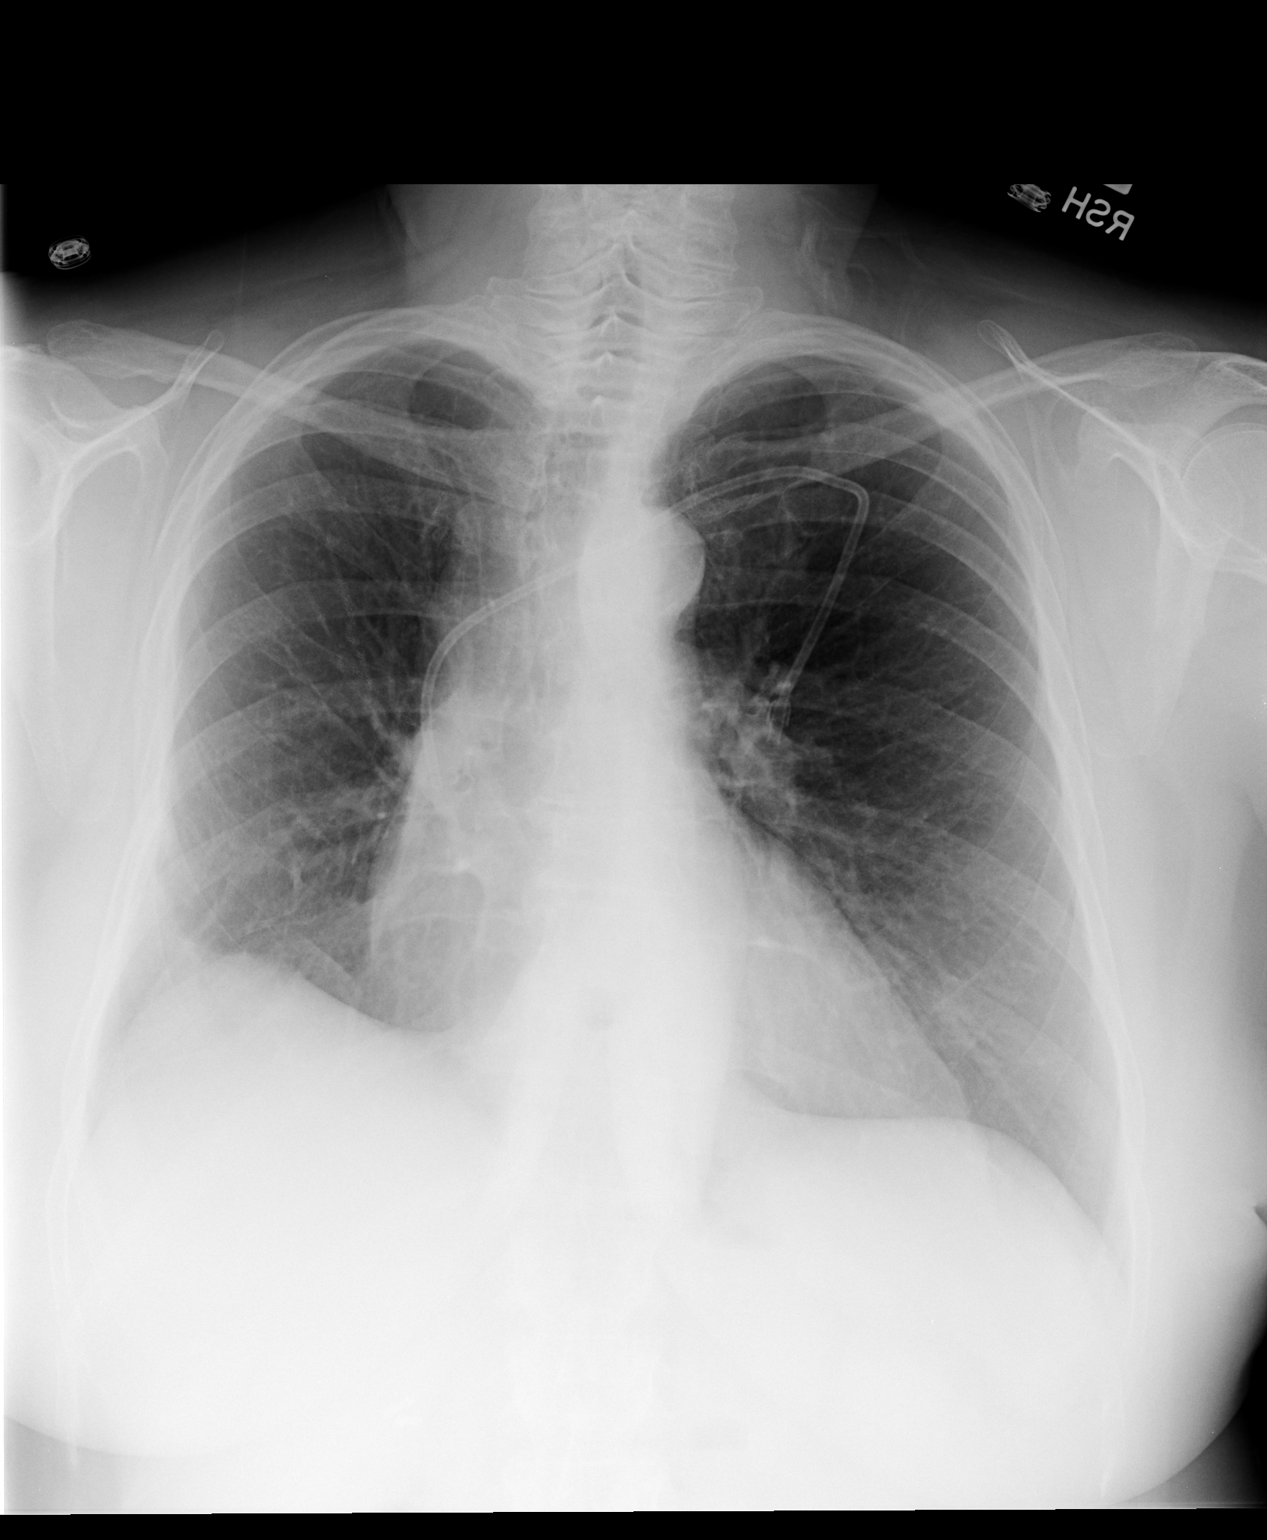

[view not recorded (2 of 2)]
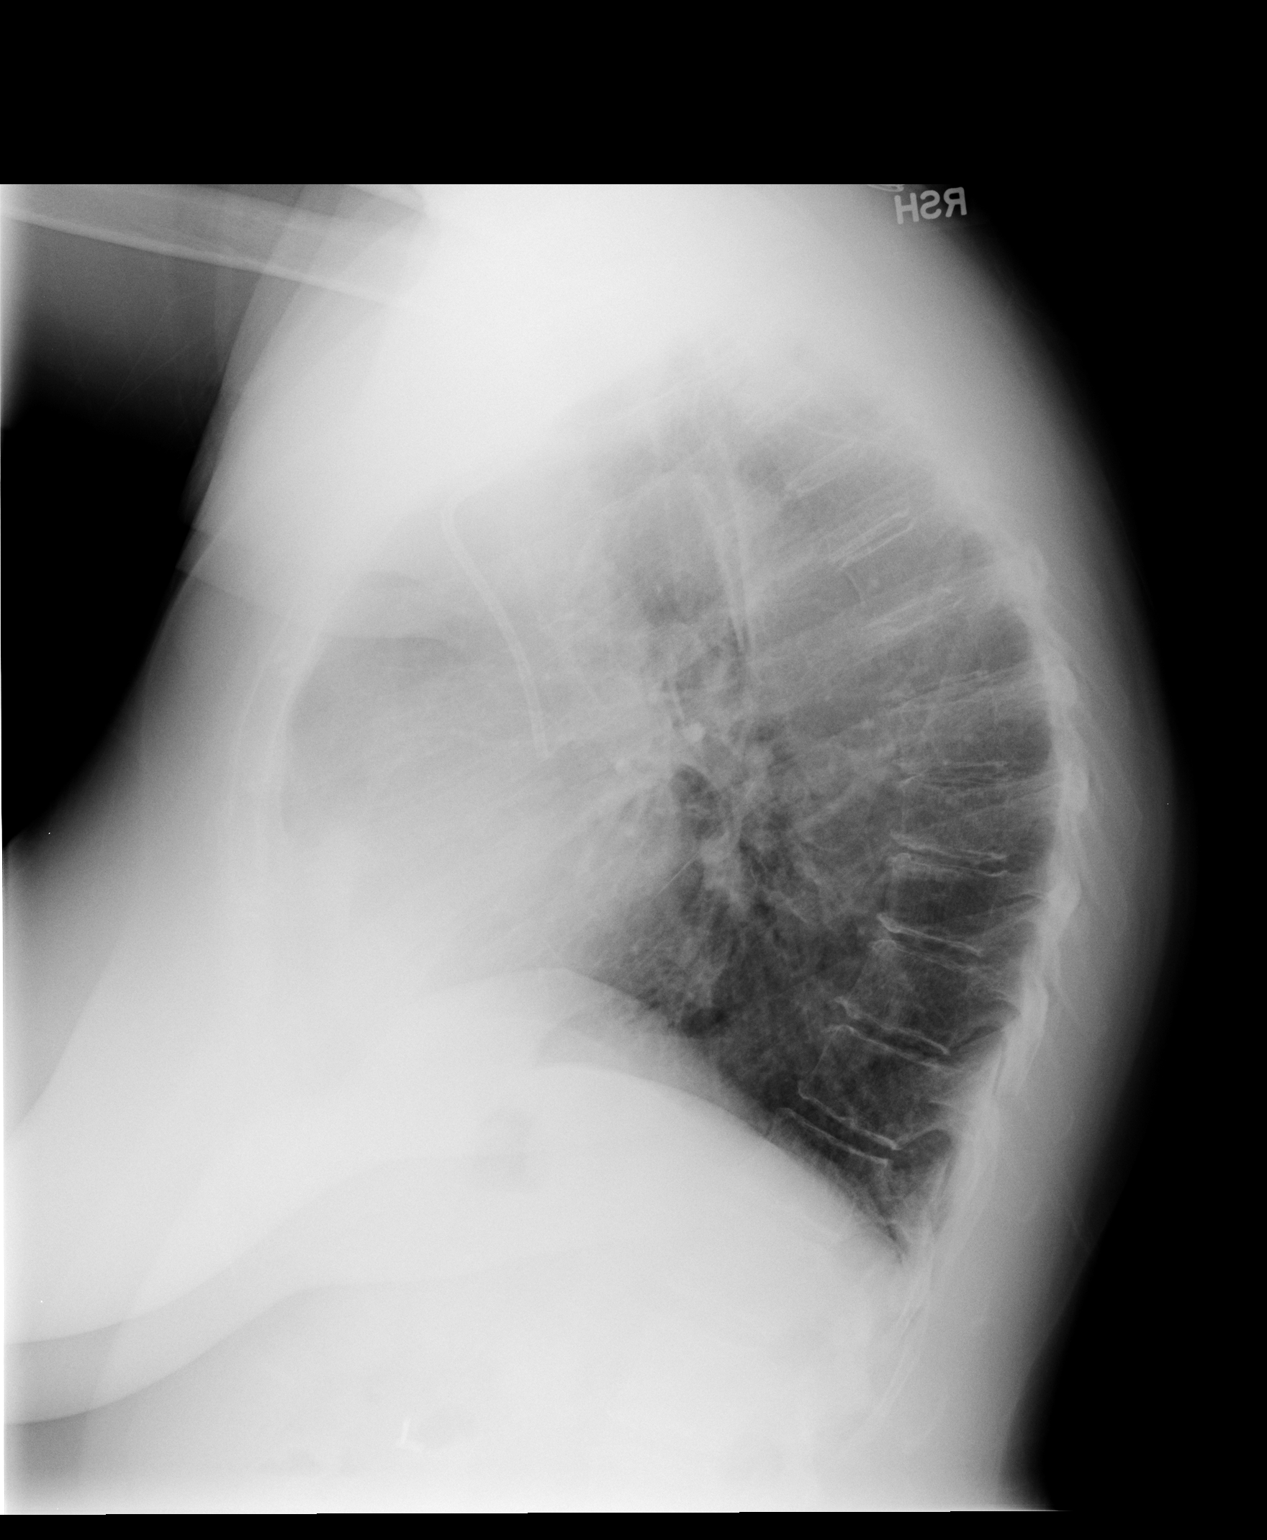

[2 of 2 positions shown; findings below may reference images not displayed]

FINDINGS: Chronic pleural-parenchymal scarring and elevation of the left hemidiaphragm is seen without change.  No evidence of an acute infiltrate or pleural effusion.  Heart size is mildly enlarged but stable.  No evidence of congestive heart failure.  There is no evidence of mass.  Left-sided port-a-cath remains in appropriate position.
IMPRESSION: Stable postoperative change in the right hemithorax.  No active disease.

## 2007-02-14 ENCOUNTER — Ambulatory Visit: Payer: Self-pay | Admitting: Hematology & Oncology

## 2007-02-16 LAB — CBC WITH DIFFERENTIAL/PLATELET
BASO%: 0.6 % (ref 0.0–2.0)
Basophils Absolute: 0.1 10*3/uL (ref 0.0–0.1)
EOS%: 1.5 % (ref 0.0–7.0)
MCH: 27.5 pg (ref 26.0–34.0)
MCHC: 34.6 g/dL (ref 32.0–36.0)
MCV: 79.7 fL — ABNORMAL LOW (ref 81.0–101.0)
MONO%: 4.9 % (ref 0.0–13.0)
RDW: 15.6 % — ABNORMAL HIGH (ref 11.3–14.5)
lymph#: 3.2 10*3/uL (ref 0.9–3.3)

## 2007-02-16 LAB — COMPREHENSIVE METABOLIC PANEL
ALT: 16 U/L (ref 0–35)
AST: 14 U/L (ref 0–37)
Albumin: 4.4 g/dL (ref 3.5–5.2)
Alkaline Phosphatase: 83 U/L (ref 39–117)
BUN: 17 mg/dL (ref 6–23)
Calcium: 9.5 mg/dL (ref 8.4–10.5)
Chloride: 105 mEq/L (ref 96–112)
Creatinine, Ser: 0.67 mg/dL (ref 0.40–1.20)
Potassium: 4 mEq/L (ref 3.5–5.3)

## 2007-03-01 ENCOUNTER — Encounter: Admission: RE | Admit: 2007-03-01 | Discharge: 2007-03-01 | Payer: Self-pay | Admitting: Hematology & Oncology

## 2007-03-19 ENCOUNTER — Ambulatory Visit: Payer: Self-pay | Admitting: Cardiothoracic Surgery

## 2007-03-19 ENCOUNTER — Encounter: Admission: RE | Admit: 2007-03-19 | Discharge: 2007-03-19 | Payer: Self-pay | Admitting: Cardiothoracic Surgery

## 2007-03-28 LAB — CBC WITH DIFFERENTIAL/PLATELET
BASO%: 0.3 % (ref 0.0–2.0)
Basophils Absolute: 0 10*3/uL (ref 0.0–0.1)
EOS%: 0 % (ref 0.0–7.0)
HCT: 34.2 % — ABNORMAL LOW (ref 34.8–46.6)
HGB: 11.8 g/dL (ref 11.6–15.9)
MCH: 27.4 pg (ref 26.0–34.0)
MCHC: 34.3 g/dL (ref 32.0–36.0)
MCV: 79.7 fL — ABNORMAL LOW (ref 81.0–101.0)
MONO%: 2.1 % (ref 0.0–13.0)
NEUT%: 78.7 % — ABNORMAL HIGH (ref 39.6–76.8)
lymph#: 2.1 10*3/uL (ref 0.9–3.3)

## 2007-03-28 LAB — COMPREHENSIVE METABOLIC PANEL
ALT: 21 U/L (ref 0–35)
AST: 17 U/L (ref 0–37)
Alkaline Phosphatase: 64 U/L (ref 39–117)
BUN: 13 mg/dL (ref 6–23)
Chloride: 105 mEq/L (ref 96–112)
Creatinine, Ser: 0.78 mg/dL (ref 0.40–1.20)
Total Bilirubin: 0.7 mg/dL (ref 0.3–1.2)

## 2007-04-16 ENCOUNTER — Ambulatory Visit: Payer: Self-pay | Admitting: Hematology & Oncology

## 2007-04-18 LAB — CBC WITH DIFFERENTIAL/PLATELET
BASO%: 0.2 % (ref 0.0–2.0)
Basophils Absolute: 0 10*3/uL (ref 0.0–0.1)
EOS%: 0 % (ref 0.0–7.0)
HCT: 32.8 % — ABNORMAL LOW (ref 34.8–46.6)
HGB: 11 g/dL — ABNORMAL LOW (ref 11.6–15.9)
LYMPH%: 10.1 % — ABNORMAL LOW (ref 14.0–48.0)
MCH: 26.8 pg (ref 26.0–34.0)
MCHC: 33.7 g/dL (ref 32.0–36.0)
MONO#: 0.7 10*3/uL (ref 0.1–0.9)
NEUT%: 86.5 % — ABNORMAL HIGH (ref 39.6–76.8)
Platelets: 321 10*3/uL (ref 145–400)
lymph#: 2.2 10*3/uL (ref 0.9–3.3)

## 2007-05-09 LAB — CBC WITH DIFFERENTIAL/PLATELET
Eosinophils Absolute: 0 10*3/uL (ref 0.0–0.5)
HCT: 30.9 % — ABNORMAL LOW (ref 34.8–46.6)
HGB: 10.7 g/dL — ABNORMAL LOW (ref 11.6–15.9)
LYMPH%: 15.2 % (ref 14.0–48.0)
MONO#: 0.4 10*3/uL (ref 0.1–0.9)
NEUT#: 10.3 10*3/uL — ABNORMAL HIGH (ref 1.5–6.5)
NEUT%: 81.6 % — ABNORMAL HIGH (ref 39.6–76.8)
Platelets: 295 10*3/uL (ref 145–400)
WBC: 12.6 10*3/uL — ABNORMAL HIGH (ref 3.9–10.0)
lymph#: 1.9 10*3/uL (ref 0.9–3.3)

## 2007-05-15 ENCOUNTER — Inpatient Hospital Stay (HOSPITAL_COMMUNITY): Admission: EM | Admit: 2007-05-15 | Discharge: 2007-05-16 | Payer: Self-pay | Admitting: Emergency Medicine

## 2007-05-22 IMAGING — CT CT CHEST W/ CM
2 of 3 series · 15 of 36 positions shown, 18 images · IV contrast (omnipaque)
Comparison: 03/15/05.

CLINICAL DATA: Lung cancer; postsurgery and chemotherapy February 2004. 
 CHEST CT WITH CONTRAST:
TECHNIQUE: Multidetector CT imaging of the chest was performed following the standard protocol during bolus administration of intravenous contrast.
 Contrast:  80 cc Omnipaque 300

[Series 2: chest_routine 5.0 b40f st · axial · 0.59mm/px · z∈[-302,-58]mm · 12 of 59 slices shown, 15 images]
[im 5/59  mediastinal]
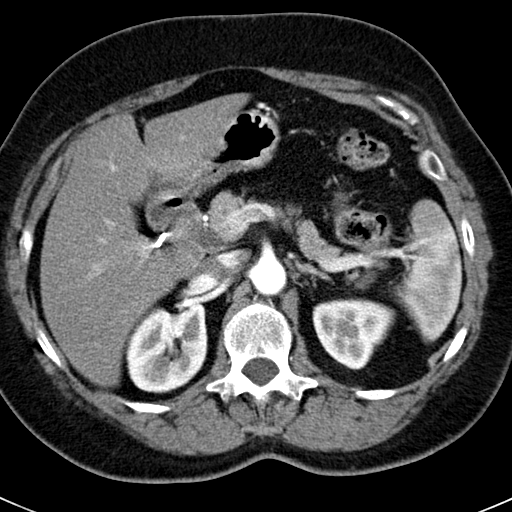
[im 5/59  lung]
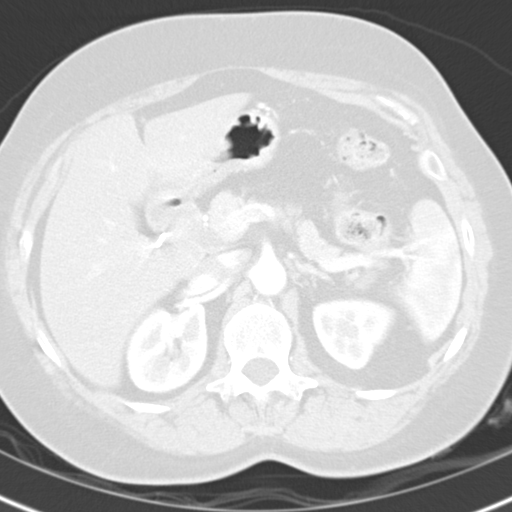
[im 9/59  lung]
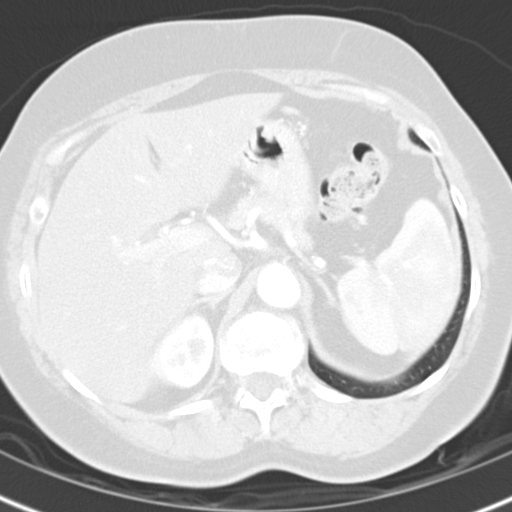
[im 13/59  lung]
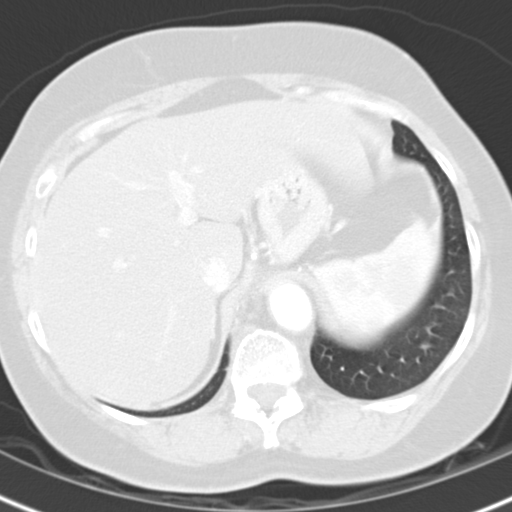
[im 18/59  lung]
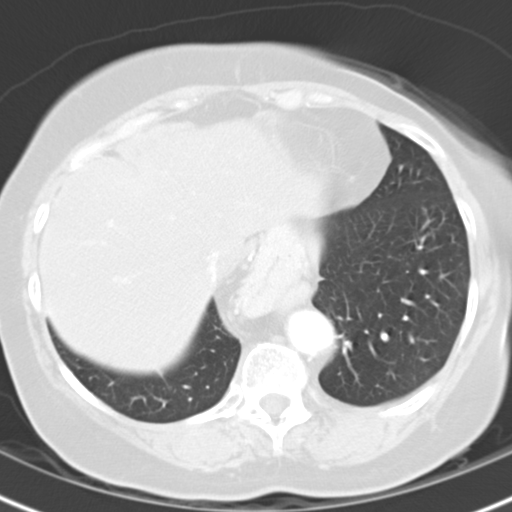
[im 22/59  mediastinal]
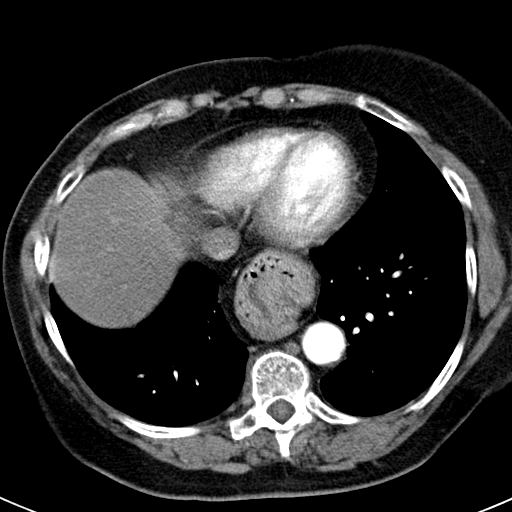
[im 22/59  lung]
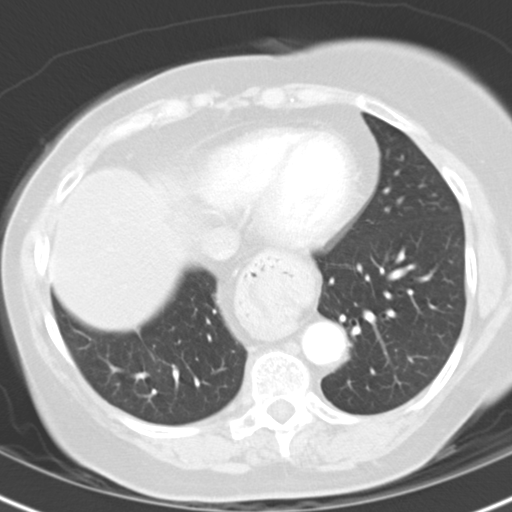
[im 26/59  lung]
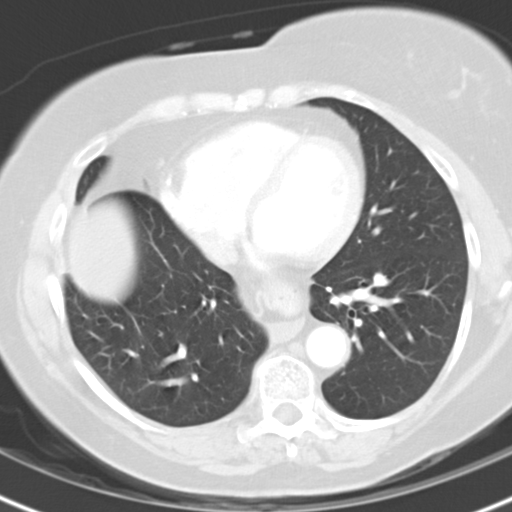
[im 33/59  lung]
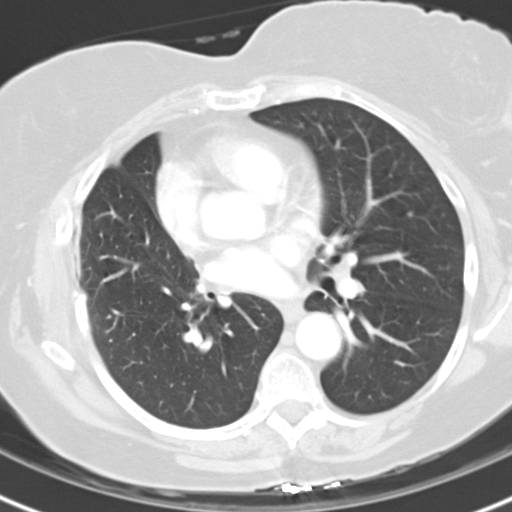
[im 37/59  lung]
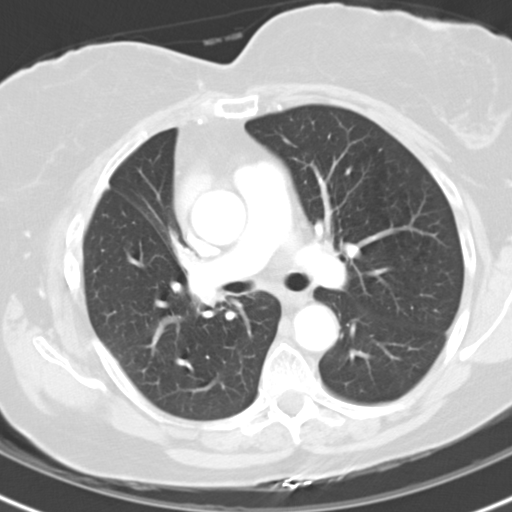
[im 41/59  mediastinal]
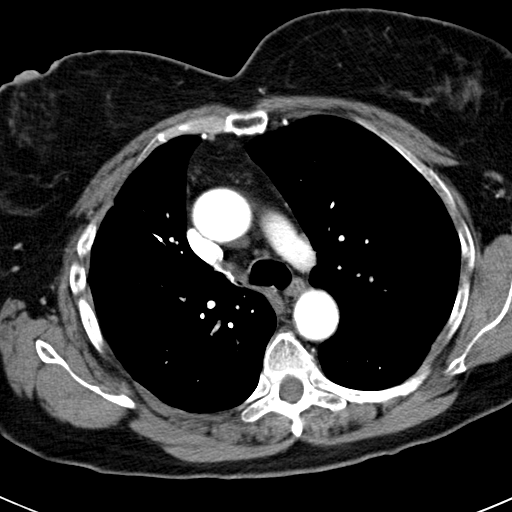
[im 41/59  lung]
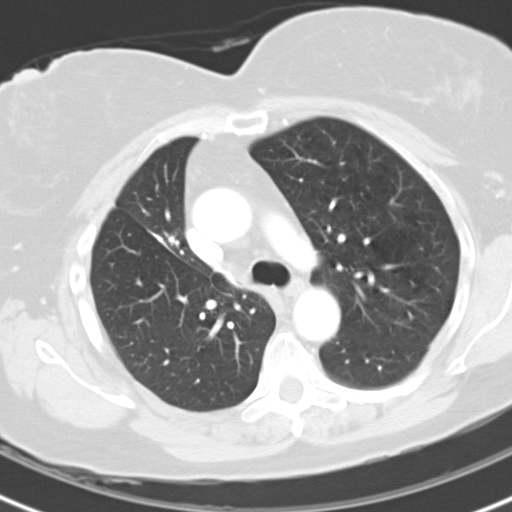
[im 46/59  lung]
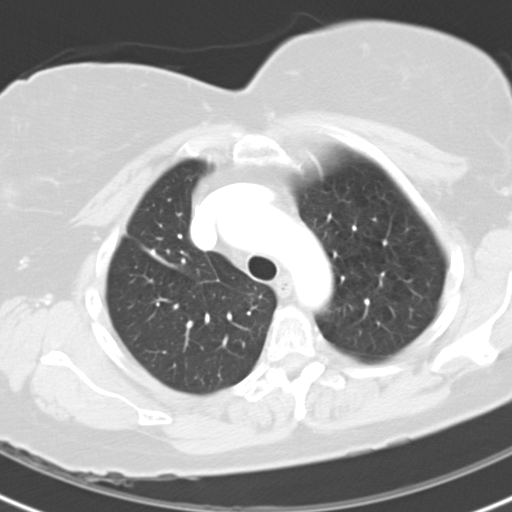
[im 50/59  lung]
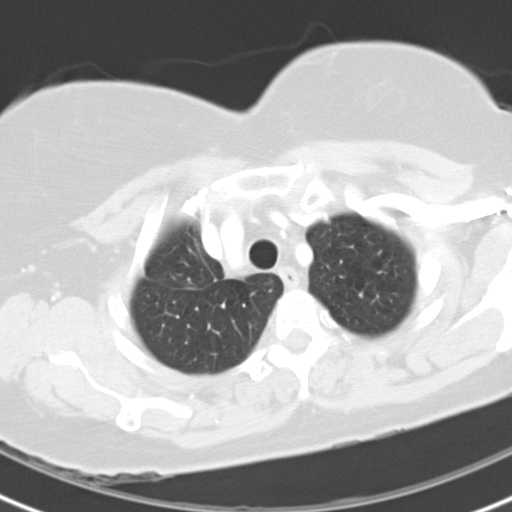
[im 54/59  lung]
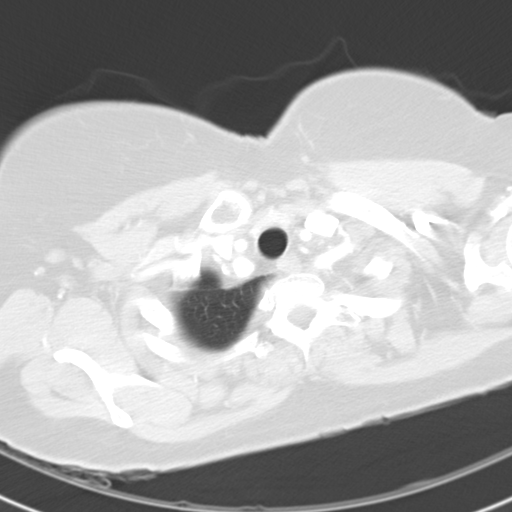

[Series 602: coronal · coronal · 0.60mm/px · 3 of 39 slices shown]
[im 8/39  lung]
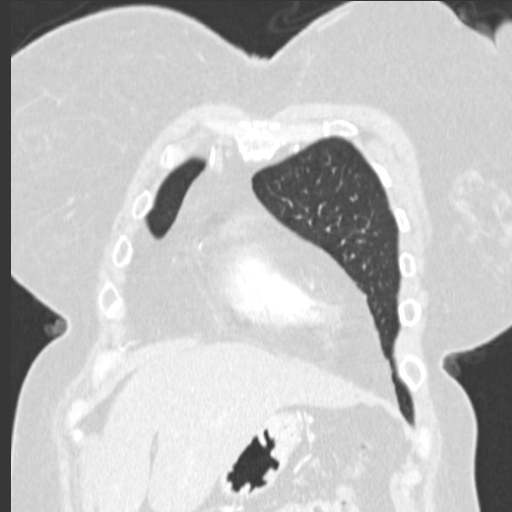
[im 16/39  lung]
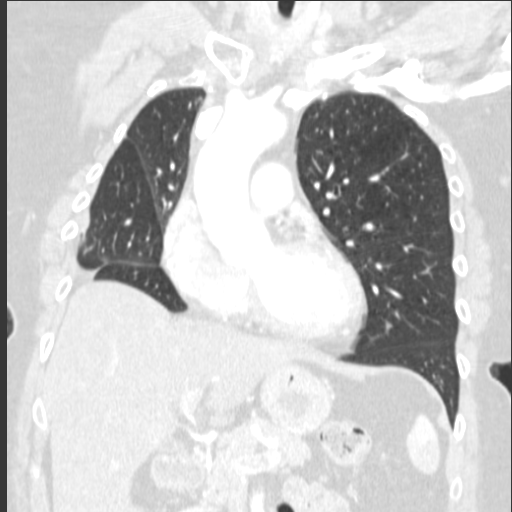
[im 23/39  lung]
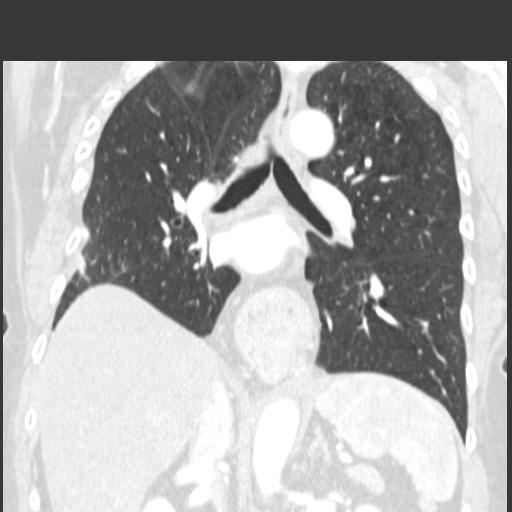

[15 of 36 positions shown; findings below may reference images not displayed]

FINDINGS: Scarring is noted in the right upper lung probably related to prior surgery.  There are no lung masses or nodules.  No active appearing infiltrates.  There are some deformities of the right lower lateral ribs as before, possibly related to old trauma or surgery. 
 No adenopathy.  No pleural or pericardial fluid.  Hiatal hernia again noted.  Lowest cuts include some of the upper abdomen.  The adrenals and that portion of the liver visualized remain normal.
IMPRESSION: Postoperative changes, with no evidence for residual or recurrent tumor.

## 2007-06-05 ENCOUNTER — Encounter: Admission: RE | Admit: 2007-06-05 | Discharge: 2007-06-05 | Payer: Self-pay | Admitting: Hematology & Oncology

## 2007-06-11 ENCOUNTER — Ambulatory Visit: Payer: Self-pay | Admitting: Hematology & Oncology

## 2007-06-14 LAB — CBC WITH DIFFERENTIAL/PLATELET
Basophils Absolute: 0 10*3/uL (ref 0.0–0.1)
EOS%: 2.3 % (ref 0.0–7.0)
HCT: 32.7 % — ABNORMAL LOW (ref 34.8–46.6)
HGB: 11 g/dL — ABNORMAL LOW (ref 11.6–15.9)
LYMPH%: 41.8 % (ref 14.0–48.0)
MCH: 28 pg (ref 26.0–34.0)
MCV: 83 fL (ref 81.0–101.0)
MONO%: 4.7 % (ref 0.0–13.0)
NEUT%: 50.6 % (ref 39.6–76.8)
Platelets: 338 10*3/uL (ref 145–400)
RDW: 18.5 % — ABNORMAL HIGH (ref 11.3–14.5)

## 2007-06-14 LAB — COMPREHENSIVE METABOLIC PANEL
AST: 14 U/L (ref 0–37)
Alkaline Phosphatase: 57 U/L (ref 39–117)
BUN: 13 mg/dL (ref 6–23)
Creatinine, Ser: 0.61 mg/dL (ref 0.40–1.20)
Glucose, Bld: 104 mg/dL — ABNORMAL HIGH (ref 70–99)

## 2007-06-14 LAB — LACTATE DEHYDROGENASE: LDH: 182 U/L (ref 94–250)

## 2007-07-26 ENCOUNTER — Ambulatory Visit: Payer: Self-pay | Admitting: Cardiothoracic Surgery

## 2007-09-06 ENCOUNTER — Encounter: Admission: RE | Admit: 2007-09-06 | Discharge: 2007-09-06 | Payer: Self-pay | Admitting: Hematology & Oncology

## 2007-09-11 ENCOUNTER — Ambulatory Visit: Payer: Self-pay | Admitting: Hematology & Oncology

## 2007-09-13 LAB — CBC WITH DIFFERENTIAL (CANCER CENTER ONLY)
BASO%: 0.8 % (ref 0.0–2.0)
EOS%: 2.3 % (ref 0.0–7.0)
HCT: 37.7 % (ref 34.8–46.6)
LYMPH%: 38.8 % (ref 14.0–48.0)
MCH: 26 pg (ref 26.0–34.0)
MCHC: 33.6 g/dL (ref 32.0–36.0)
MCV: 77 fL — ABNORMAL LOW (ref 81–101)
MONO#: 0.6 10*3/uL (ref 0.1–0.9)
NEUT%: 52.2 % (ref 39.6–80.0)
RDW: 14.1 % (ref 10.5–14.6)

## 2007-09-26 IMAGING — CT CT CHEST W/ CM
2 of 4 series · 15 of 36 positions shown, 18 images · IV contrast (omnipaque)
Comparison: 07/07/05 and 03/15/05.

CLINICAL DATA: Lung cancer.  Status post surgery and chemotherapy completed [DATE].
 CHEST CT WITH CONTRAST:
TECHNIQUE: Multidetector CT imaging of the chest was performed following the standard protocol during bolus administration of intravenous contrast.
 Contrast:  80 cc Omnipaque 300.

[Series 2: chest routine 5.0 b40f · axial · 0.67mm/px · z∈[-285,-40]mm · 12 of 59 slices shown, 15 images]
[im 5/59  mediastinal]
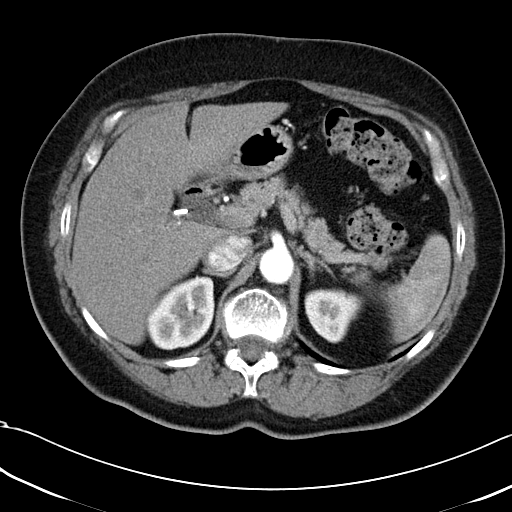
[im 5/59  lung]
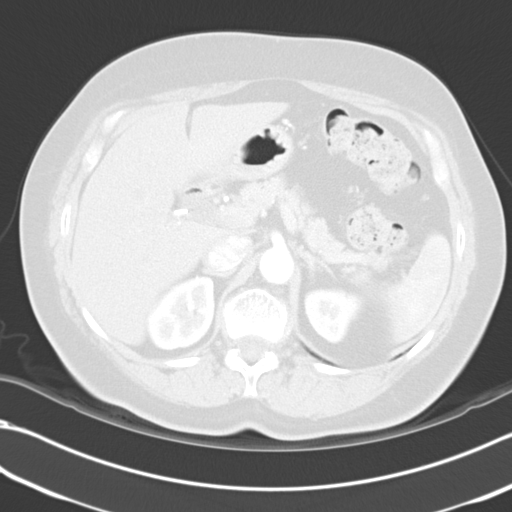
[im 9/59  lung]
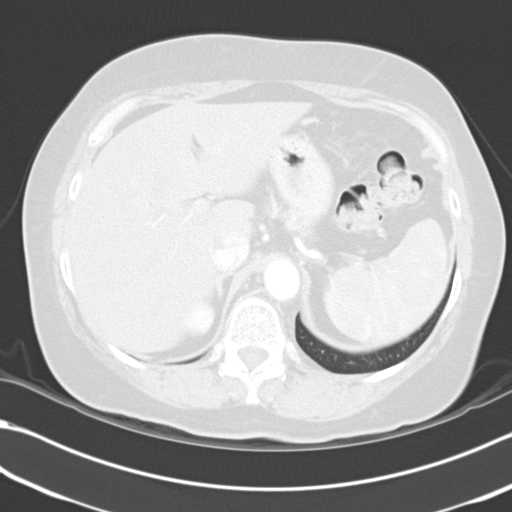
[im 13/59  lung]
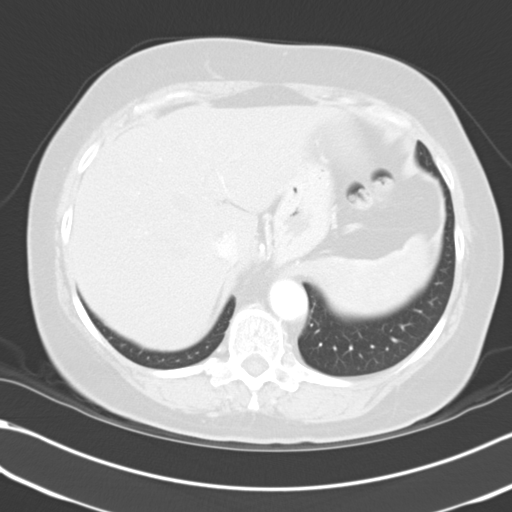
[im 17/59  lung]
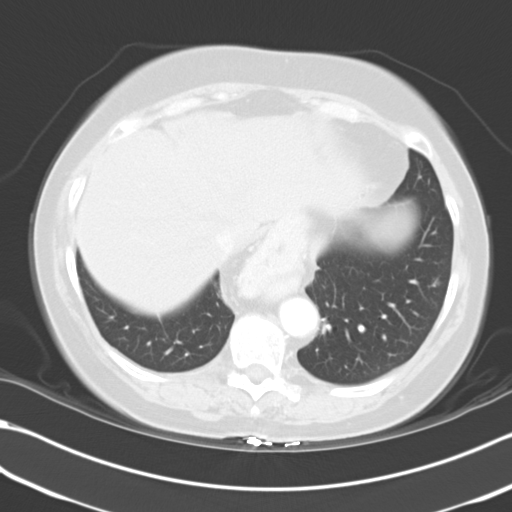
[im 21/59  mediastinal]
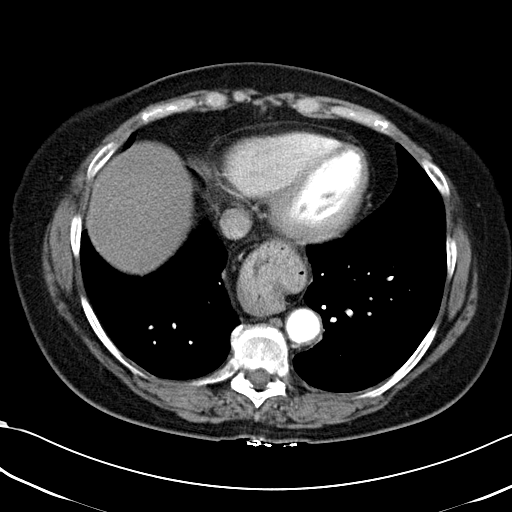
[im 21/59  lung]
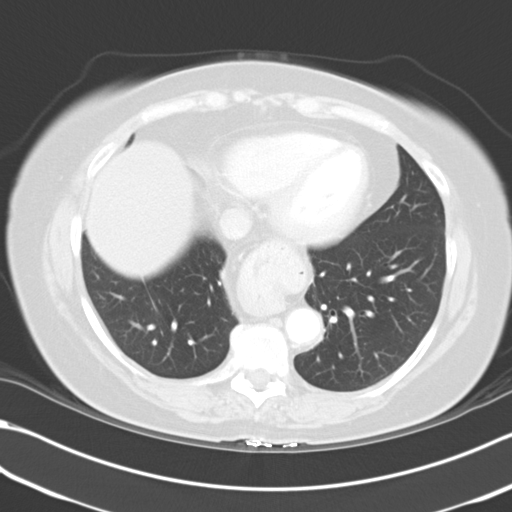
[im 25/59  lung]
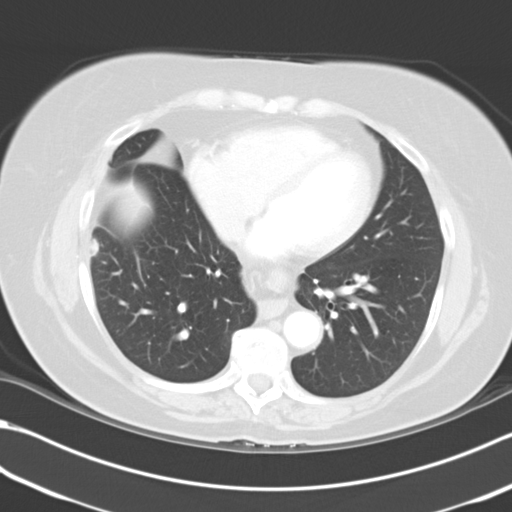
[im 34/59  lung]
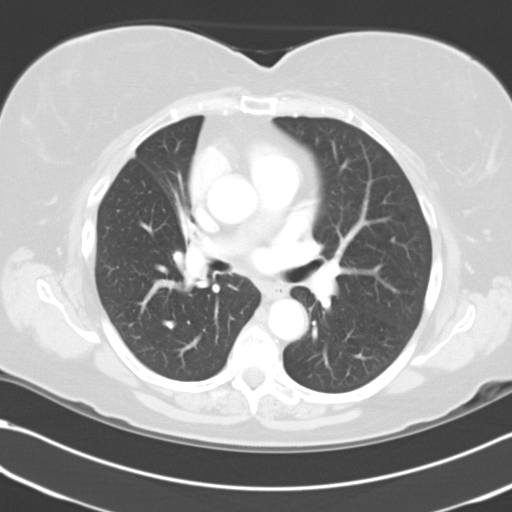
[im 38/59  lung]
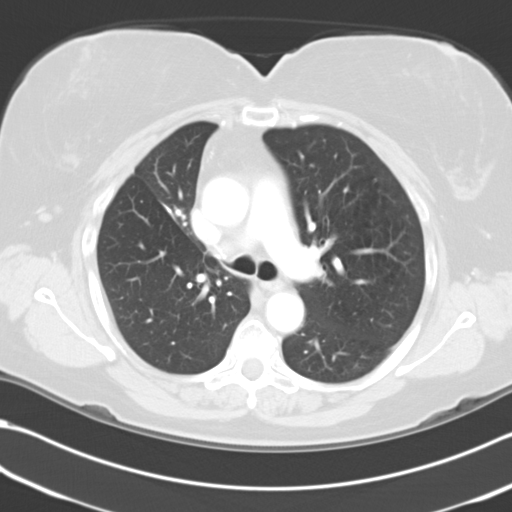
[im 42/59  mediastinal]
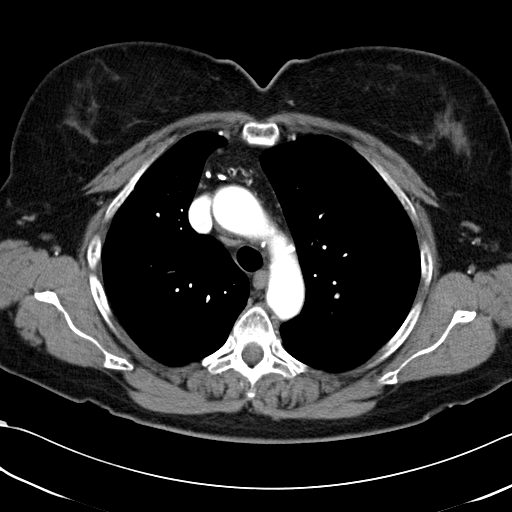
[im 42/59  lung]
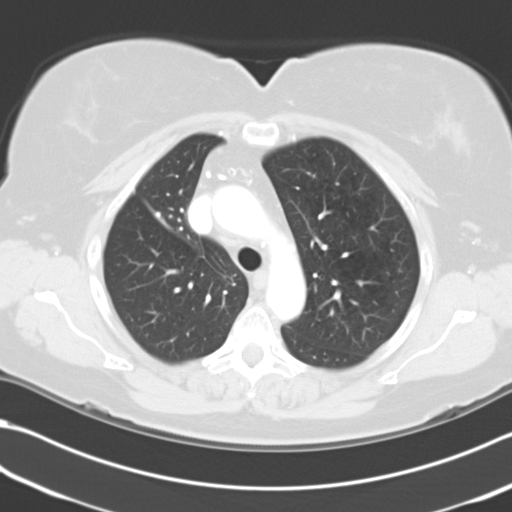
[im 46/59  lung]
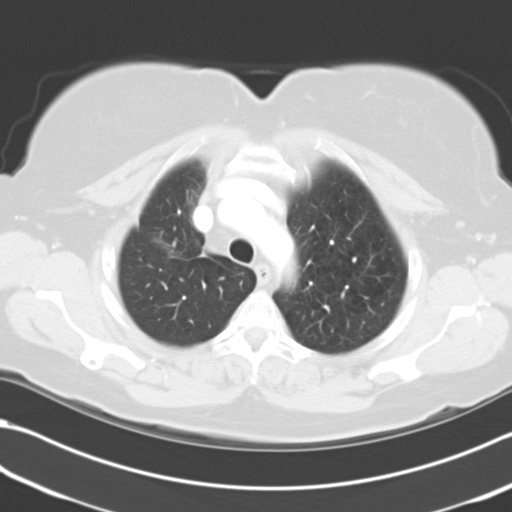
[im 50/59  lung]
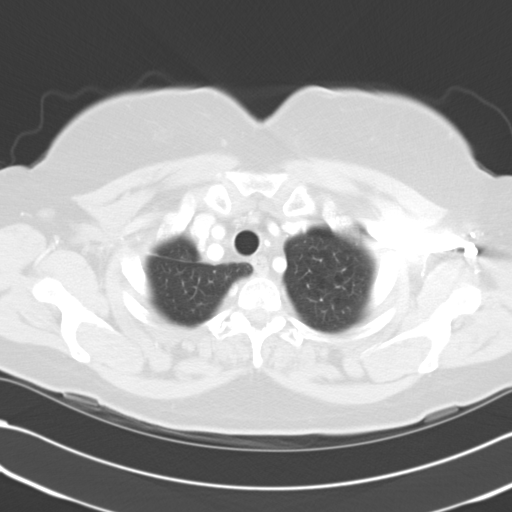
[im 54/59  lung]
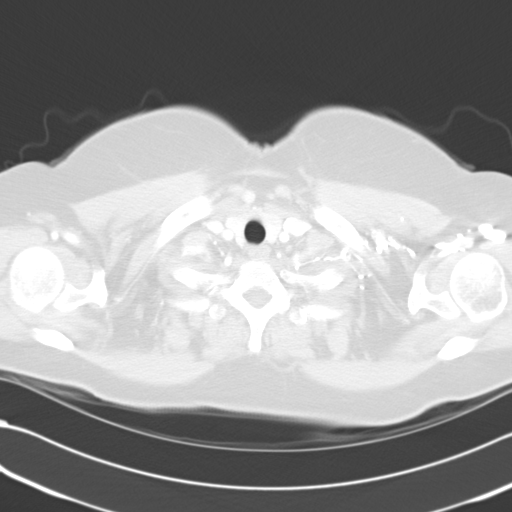

[Series 5: mpr coronal cor · coronal · 0.62mm/px · 3 of 45 slices shown]
[im 9/45  lung]
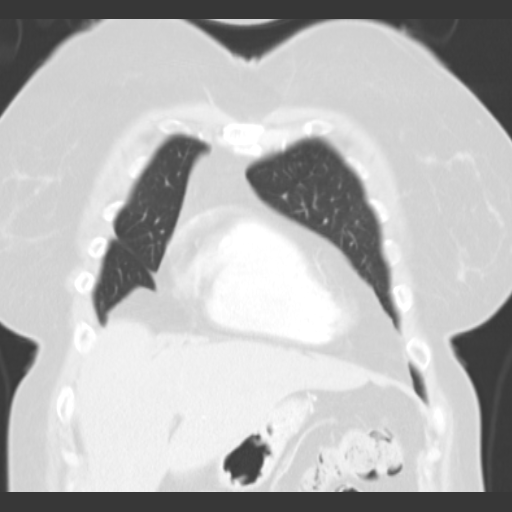
[im 18/45  lung]
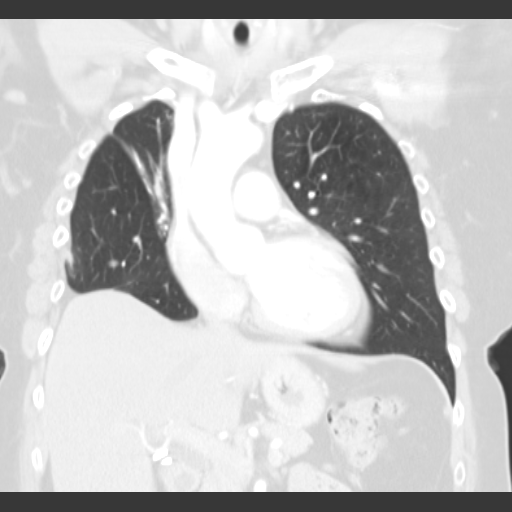
[im 27/45  lung]
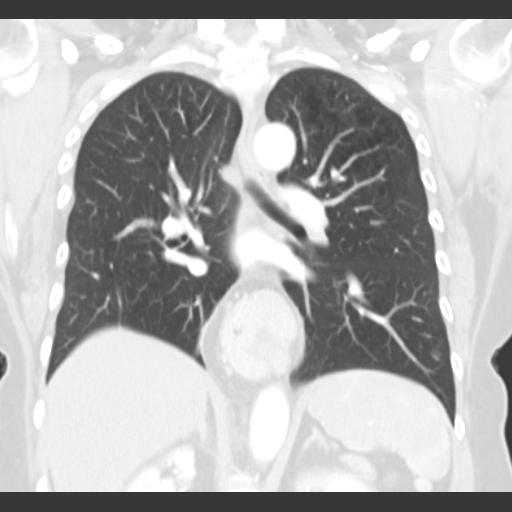

[15 of 36 positions shown; findings below may reference images not displayed]

FINDINGS: There is no hilar, mediastinal or axillary lymphadenopathy with some small mediastinal lymph nodes noted.  No pleural or pericardial effusion.  Hiatal hernia is again noted.  Blebs are noted in the right hilum with volume loss in the right chest with scarring and deformity of the right lower ribs compatible with postoperative change.  No pulmonary nodule or mass.  Incidentally imaged upper abdomen is unremarkable with the patient being status post cholecystectomy.  No suspicious bony lesions identified.
IMPRESSION: No interval change.  No evidence of residual or recurrent disease.

## 2007-10-24 LAB — COMPREHENSIVE METABOLIC PANEL
AST: 15 U/L (ref 0–37)
Albumin: 4.3 g/dL (ref 3.5–5.2)
Alkaline Phosphatase: 69 U/L (ref 39–117)
BUN: 15 mg/dL (ref 6–23)
Potassium: 3.8 mEq/L (ref 3.5–5.3)
Total Bilirubin: 0.3 mg/dL (ref 0.3–1.2)

## 2007-11-09 ENCOUNTER — Encounter: Admission: RE | Admit: 2007-11-09 | Discharge: 2007-11-09 | Payer: Self-pay | Admitting: Hematology & Oncology

## 2007-11-14 ENCOUNTER — Encounter: Admission: RE | Admit: 2007-11-14 | Discharge: 2007-11-14 | Payer: Self-pay | Admitting: Hematology & Oncology

## 2007-11-23 ENCOUNTER — Ambulatory Visit: Payer: Self-pay | Admitting: Hematology & Oncology

## 2007-11-26 LAB — COMPREHENSIVE METABOLIC PANEL
Albumin: 4.4 g/dL (ref 3.5–5.2)
Alkaline Phosphatase: 71 U/L (ref 39–117)
CO2: 25 mEq/L (ref 19–32)
Glucose, Bld: 110 mg/dL — ABNORMAL HIGH (ref 70–99)
Potassium: 3.6 mEq/L (ref 3.5–5.3)
Sodium: 140 mEq/L (ref 135–145)
Total Protein: 7.1 g/dL (ref 6.0–8.3)

## 2007-11-26 LAB — CBC WITH DIFFERENTIAL (CANCER CENTER ONLY)
BASO#: 0.1 10*3/uL (ref 0.0–0.2)
Eosinophils Absolute: 0.2 10*3/uL (ref 0.0–0.5)
HCT: 33.5 % — ABNORMAL LOW (ref 34.8–46.6)
LYMPH%: 40.7 % (ref 14.0–48.0)
MCV: 77 fL — ABNORMAL LOW (ref 81–101)
MONO#: 0.4 10*3/uL (ref 0.1–0.9)
NEUT%: 51.2 % (ref 39.6–80.0)
RBC: 4.33 10*6/uL (ref 3.70–5.32)
WBC: 7.8 10*3/uL (ref 3.9–10.0)

## 2008-01-08 ENCOUNTER — Ambulatory Visit: Payer: Self-pay | Admitting: Hematology & Oncology

## 2008-01-23 IMAGING — CT CT CHEST W/ CM
1 of 2 series · 14 of 31 positions shown, 18 images · IV contrast (omnipaque)
Comparison: 11/11/05.

CLINICAL DATA: 55 year-old-female with lung cancer. 
 CHEST CT WITH CONTRAST:
TECHNIQUE: Multidetector CT imaging of the chest was performed following the standard protocol during bolus administration of intravenous contrast.
 Contrast:  80 cc Omnipaque 300.

[Series 2: chest_routine 5.0 b40f st · axial · 0.65mm/px · z∈[-299,-39]mm · 14 of 62 slices shown, 18 images]
[im 5/62  mediastinal]
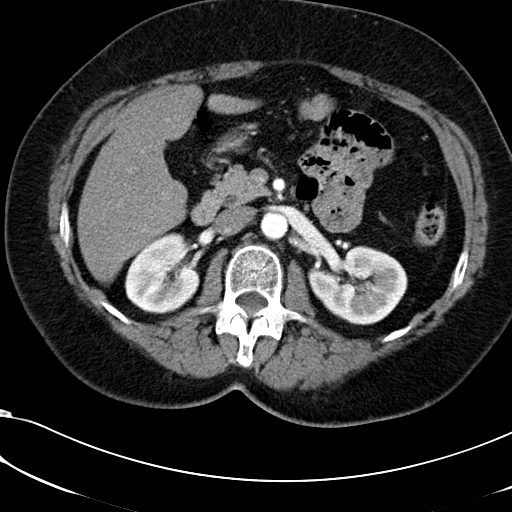
[im 5/62  lung]
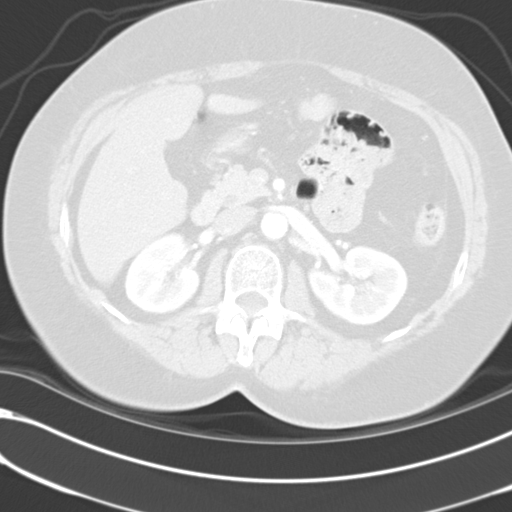
[im 10/62  lung]
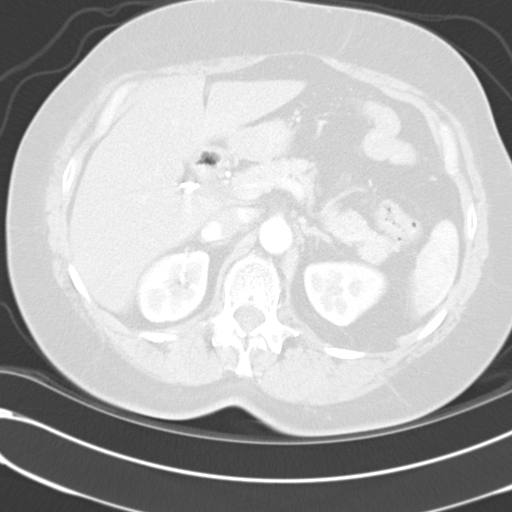
[im 15/62  lung]
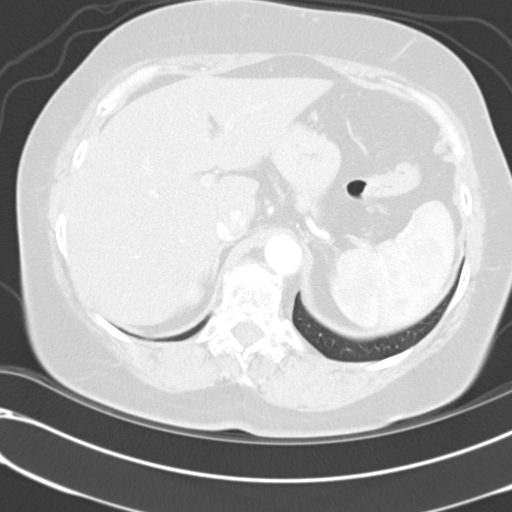
[im 19/62  lung]
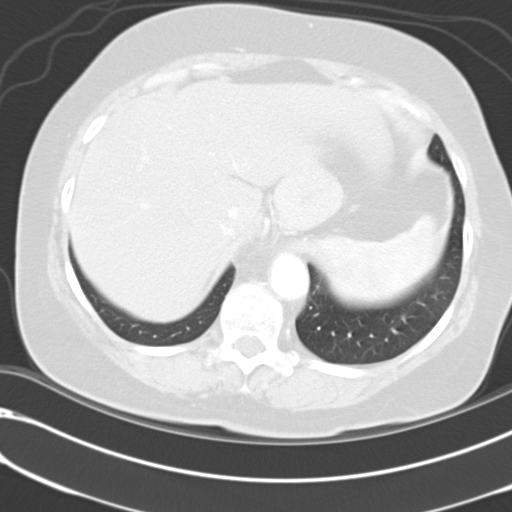
[im 24/62  mediastinal]
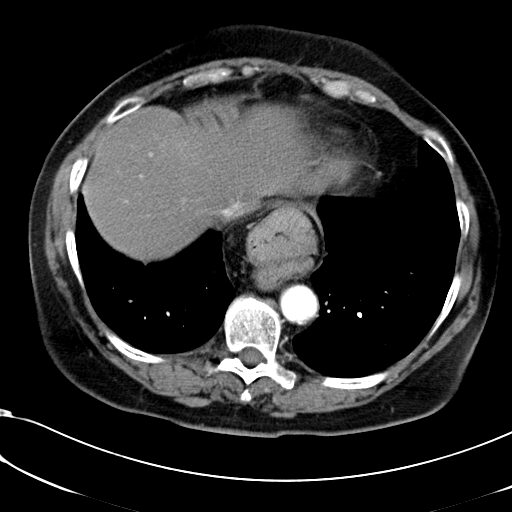
[im 24/62  lung]
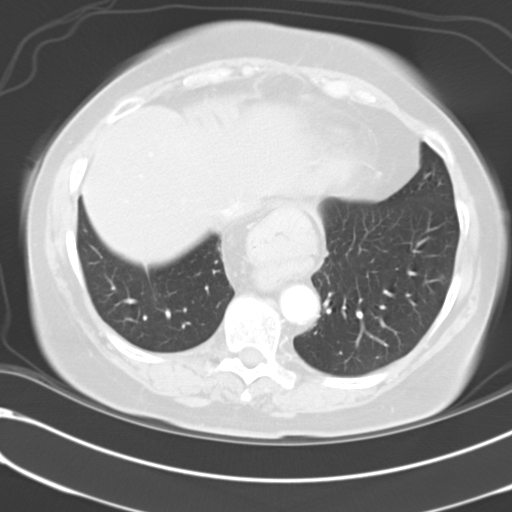
[im 29/62  lung]
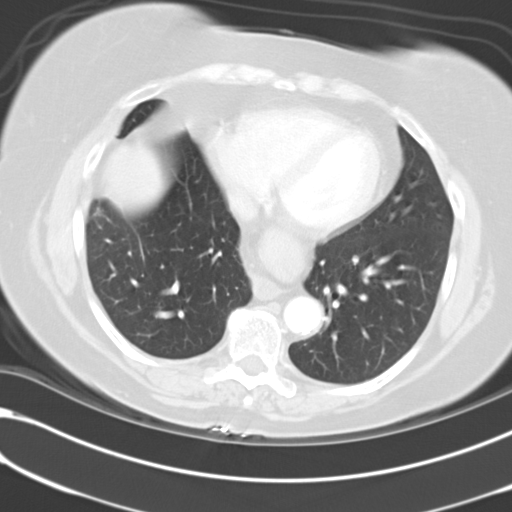
[im 30/62  lung]
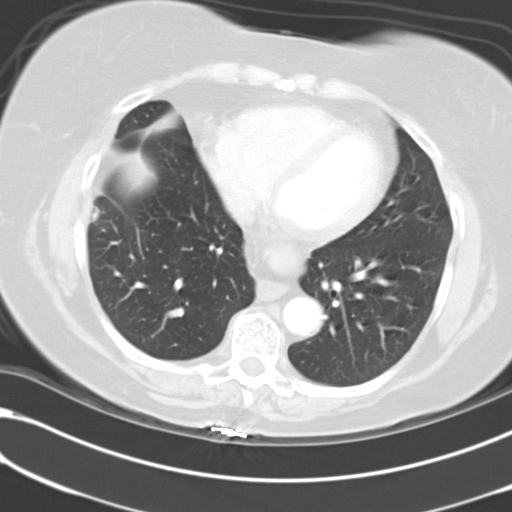
[im 31/62  lung]
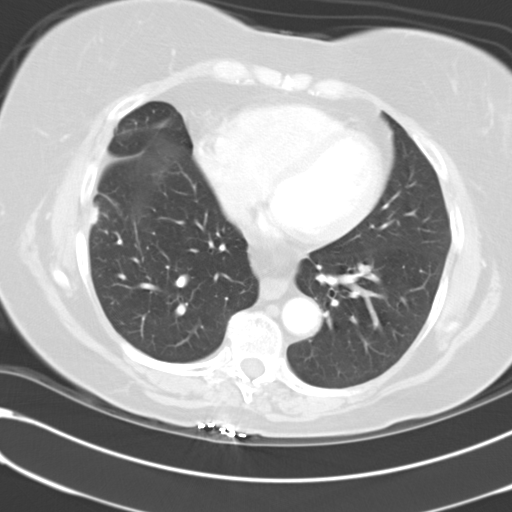
[im 33/62  mediastinal]
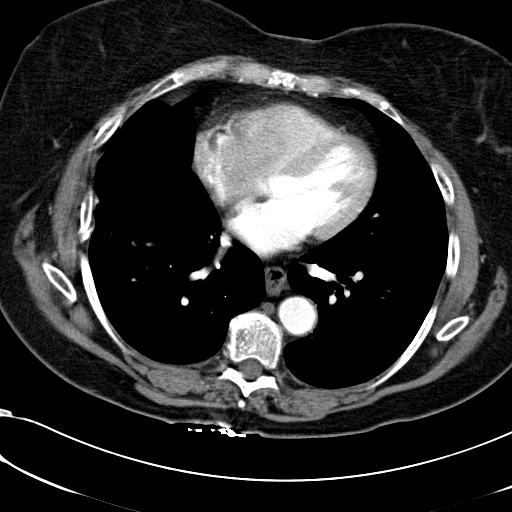
[im 33/62  lung]
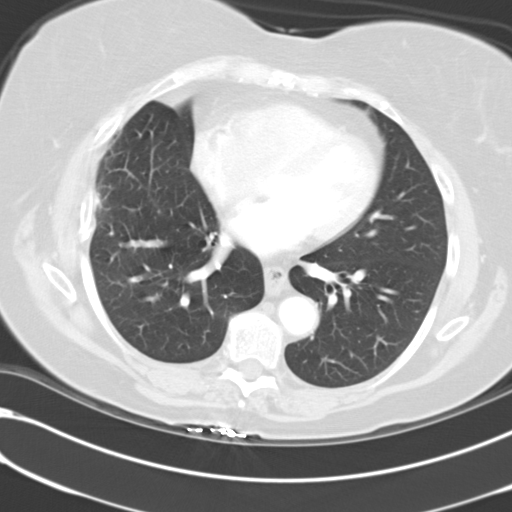
[im 38/62  lung]
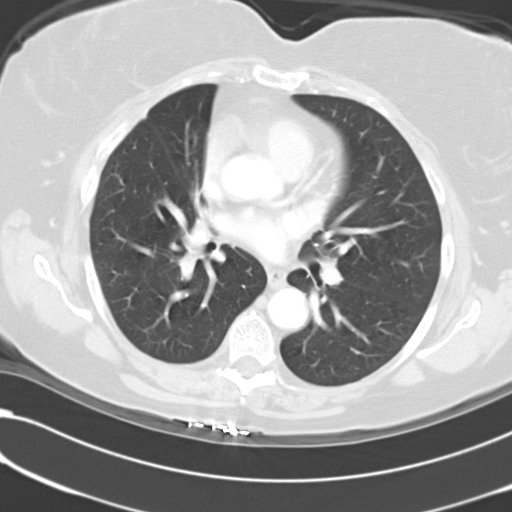
[im 43/62  lung]
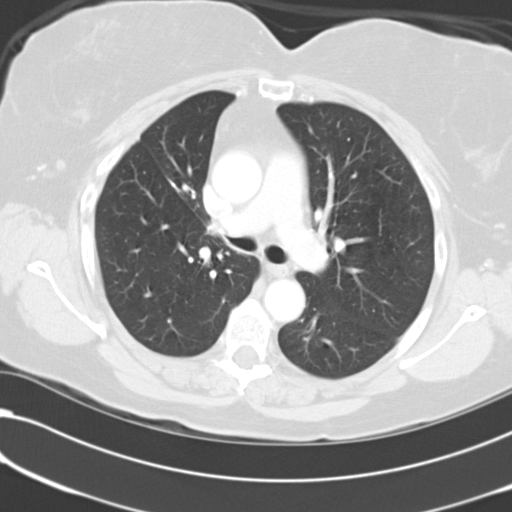
[im 47/62  lung]
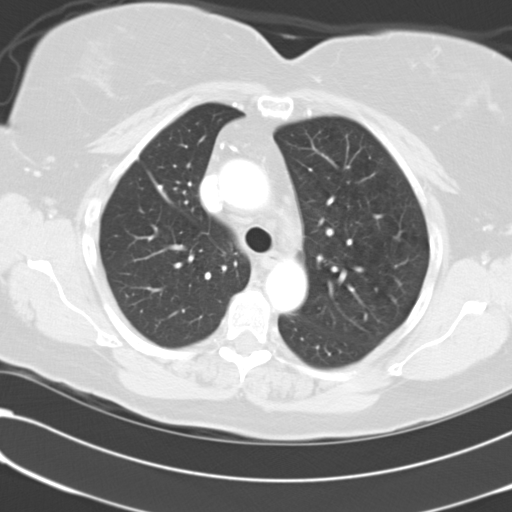
[im 52/62  mediastinal]
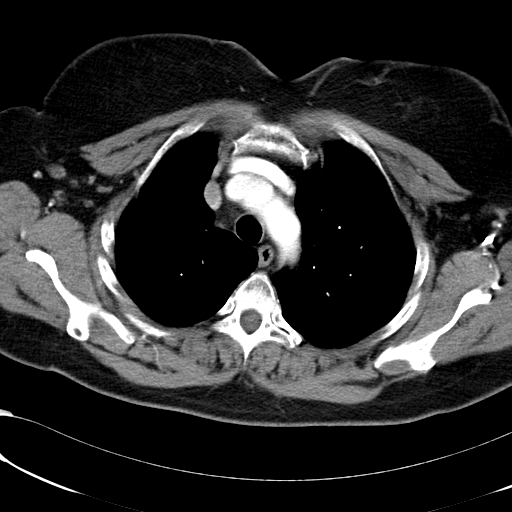
[im 52/62  lung]
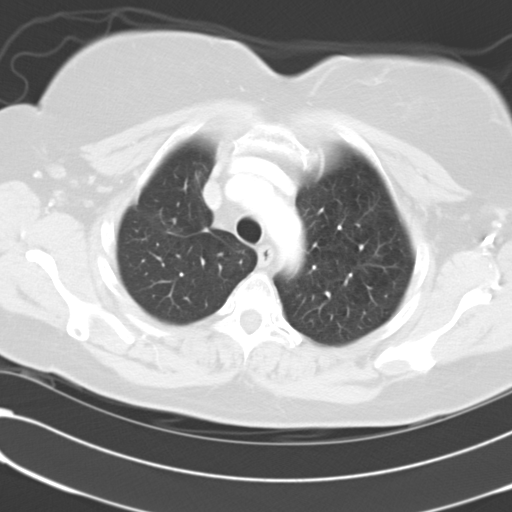
[im 57/62  lung]
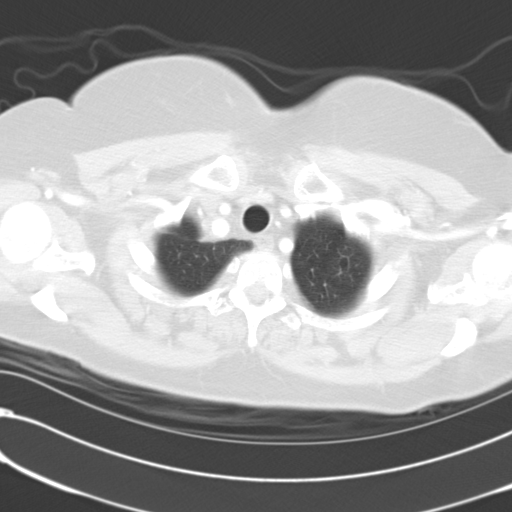

[14 of 31 positions shown; findings below may reference images not displayed]

FINDINGS: The chest wall, soft tissues and bony structures are unremarkable.  No obvious breast masses, supraclavicular or axillary adenopathy.  
 Heart size is normal.  No pericardial effusion.  No enlarged mediastinal or hilar lymph nodes. The aorta is normal in caliber.  No dissection.   The esophagus is grossly normal.  There is a moderate to large hiatal hernia.  
 Surgical changes in the right hilum from probable right upper lobe lobectomy.  No findings worrisome for recurrent tumor.  No pulmonary nodules to suggest metastatic pulmonary disease. Stable subpleural scarring changes adjacent to fractured ribs likely from the thoracotomy. 
 The upper abdomen demonstrates fatty infiltration of the liver but no evidence for upper abdominal adenopathy.
IMPRESSION: 1.  No CT evidence for recurrent or residual tumor.  No evidence for metastatic disease.  
 2.   Moderate sized hiatal hernia, stable. 
 3.  Diffuse fatty infiltration of the liver.  
 4.  Stable surgical changes in the right hilum.

## 2008-02-08 HISTORY — PX: HIATAL HERNIA REPAIR: SHX195

## 2008-02-12 ENCOUNTER — Ambulatory Visit: Payer: Self-pay | Admitting: Diagnostic Radiology

## 2008-02-12 ENCOUNTER — Ambulatory Visit (HOSPITAL_BASED_OUTPATIENT_CLINIC_OR_DEPARTMENT_OTHER): Admission: RE | Admit: 2008-02-12 | Discharge: 2008-02-12 | Payer: Self-pay | Admitting: Hematology & Oncology

## 2008-02-19 ENCOUNTER — Ambulatory Visit (HOSPITAL_COMMUNITY): Admission: RE | Admit: 2008-02-19 | Discharge: 2008-02-19 | Payer: Self-pay | Admitting: Hematology & Oncology

## 2008-02-25 ENCOUNTER — Ambulatory Visit: Payer: Self-pay | Admitting: Hematology & Oncology

## 2008-03-25 ENCOUNTER — Encounter: Admission: RE | Admit: 2008-03-25 | Discharge: 2008-03-25 | Payer: Self-pay | Admitting: Hematology & Oncology

## 2008-05-08 ENCOUNTER — Ambulatory Visit: Payer: Self-pay | Admitting: Hematology & Oncology

## 2008-05-13 LAB — CBC WITH DIFFERENTIAL (CANCER CENTER ONLY)
BASO#: 0.1 10*3/uL (ref 0.0–0.2)
Eosinophils Absolute: 0.2 10*3/uL (ref 0.0–0.5)
HGB: 12.4 g/dL (ref 11.6–15.9)
LYMPH%: 30.8 % (ref 14.0–48.0)
MCV: 83 fL (ref 81–101)
MONO#: 0.4 10*3/uL (ref 0.1–0.9)
NEUT#: 4.2 10*3/uL (ref 1.5–6.5)
Platelets: 212 10*3/uL (ref 145–400)
RBC: 4.54 10*6/uL (ref 3.70–5.32)
WBC: 6.9 10*3/uL (ref 3.9–10.0)

## 2008-05-13 LAB — COMPREHENSIVE METABOLIC PANEL
CO2: 22 mEq/L (ref 19–32)
Creatinine, Ser: 0.63 mg/dL (ref 0.40–1.20)
Glucose, Bld: 90 mg/dL (ref 70–99)
Total Bilirubin: 0.4 mg/dL (ref 0.3–1.2)
Total Protein: 7.3 g/dL (ref 6.0–8.3)

## 2008-06-25 ENCOUNTER — Ambulatory Visit: Payer: Self-pay | Admitting: Hematology & Oncology

## 2008-06-29 IMAGING — CT CT CHEST W/ CM
2 of 3 series · 15 of 36 positions shown, 18 images · non-contrast
Comparison: 03/10/2006

CLINICAL DATA: Lung cancer
TECHNIQUE: Multidetector CT imaging of the chest was performed following the
standard protocol during bolus administration of intravenous contrast.

[Series 2: chest routine 5.0 b40f · axial · 0.66mm/px · z∈[-304,-64]mm · 12 of 58 slices shown, 15 images]
[im 5/58  mediastinal]
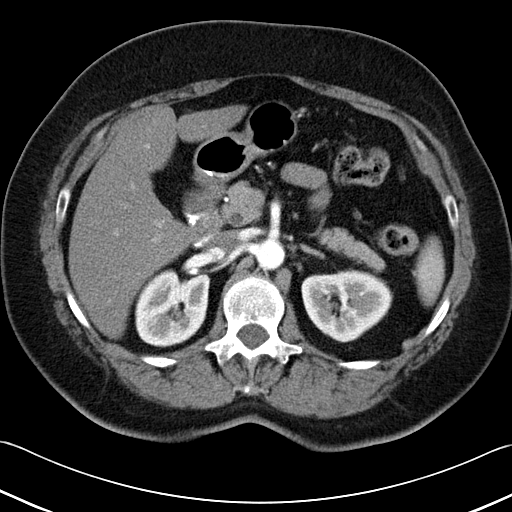
[im 5/58  lung]
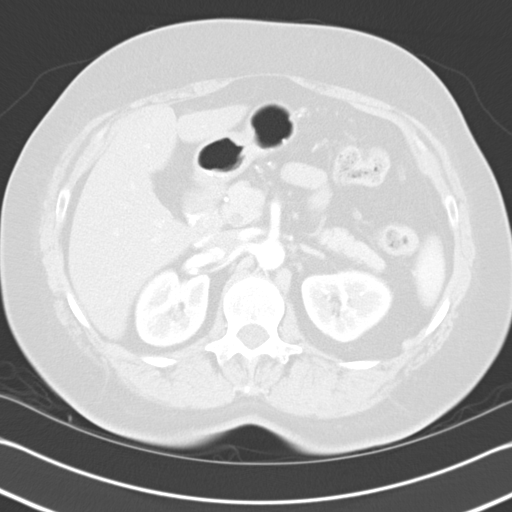
[im 9/58  lung]
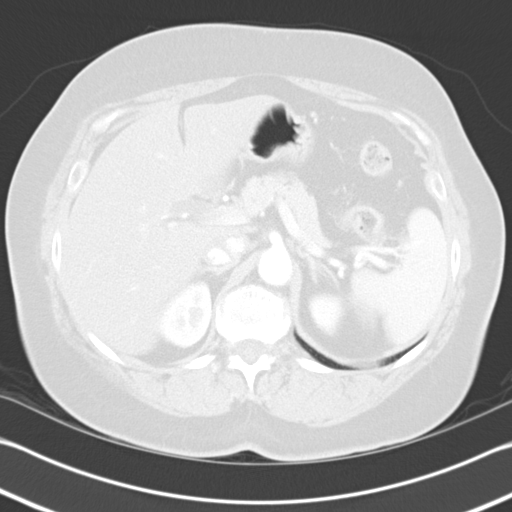
[im 13/58  lung]
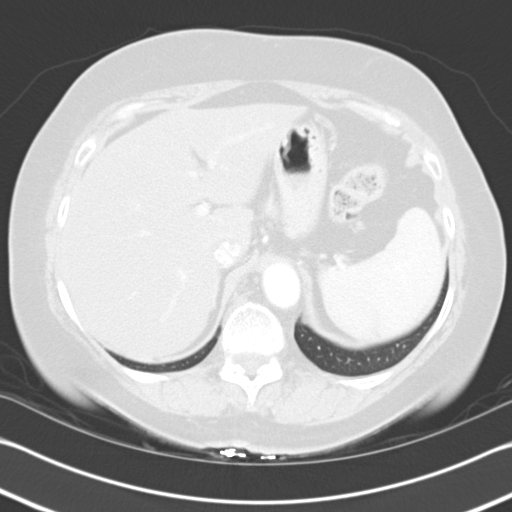
[im 17/58  lung]
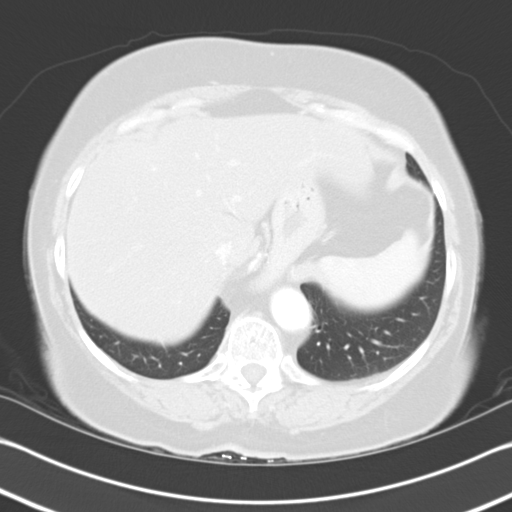
[im 22/58  mediastinal]
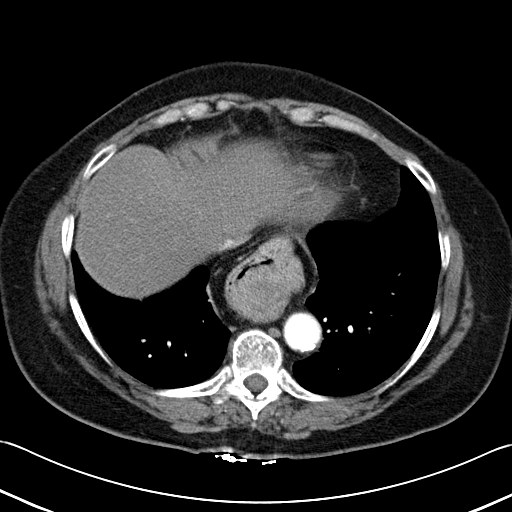
[im 22/58  lung]
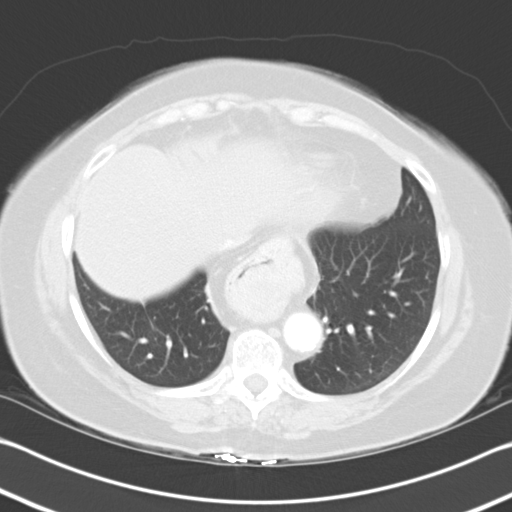
[im 26/58  lung]
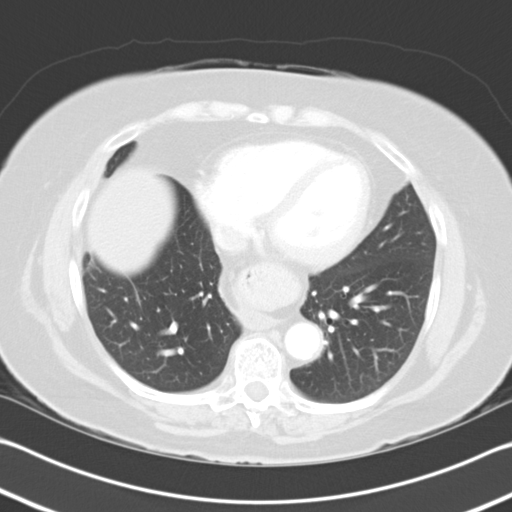
[im 32/58  lung]
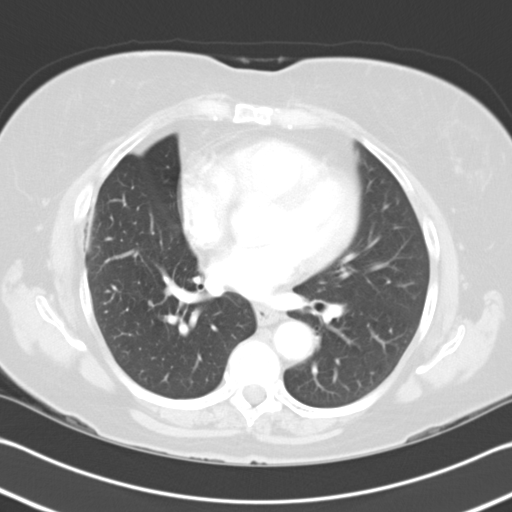
[im 36/58  lung]
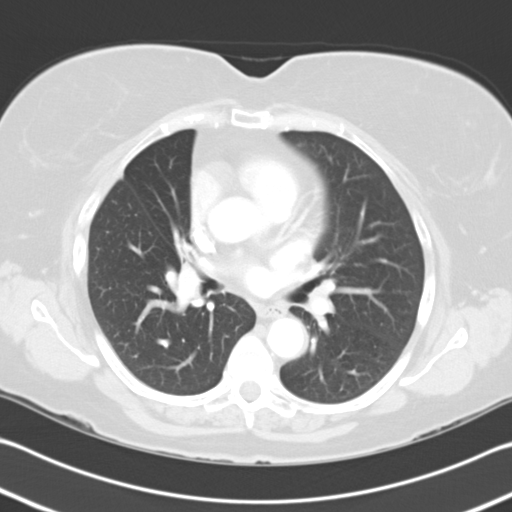
[im 41/58  mediastinal]
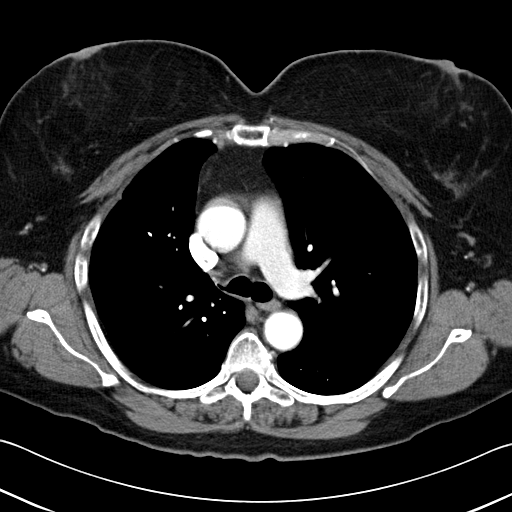
[im 41/58  lung]
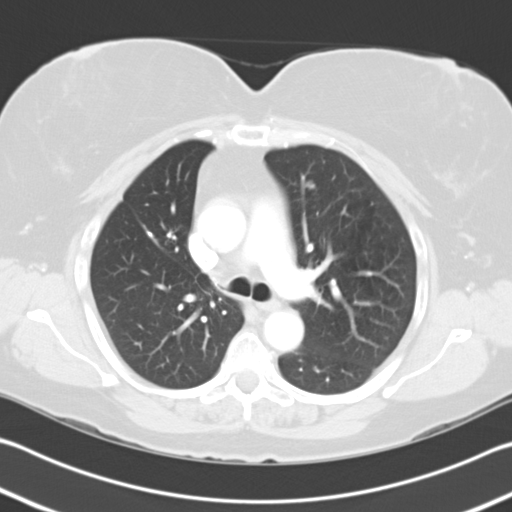
[im 45/58  lung]
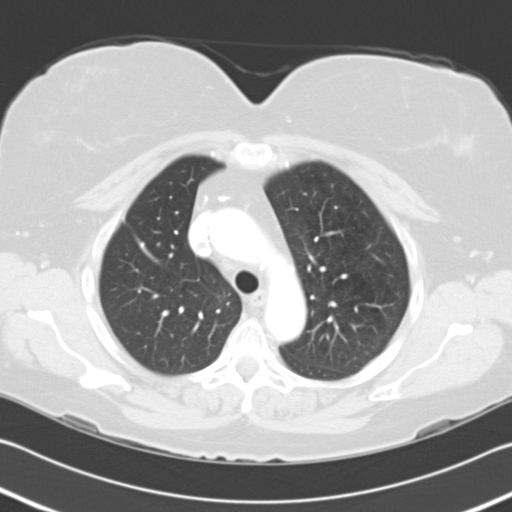
[im 49/58  lung]
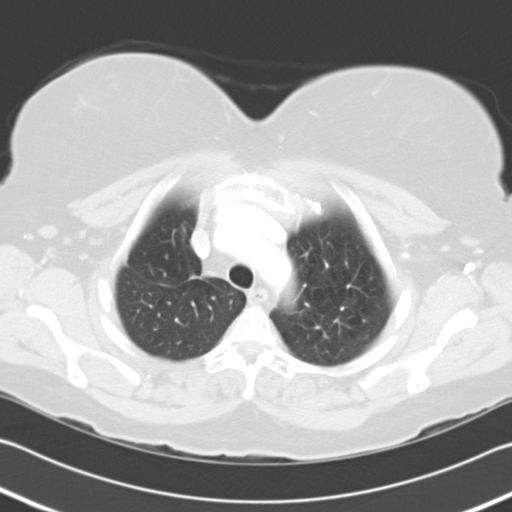
[im 53/58  lung]
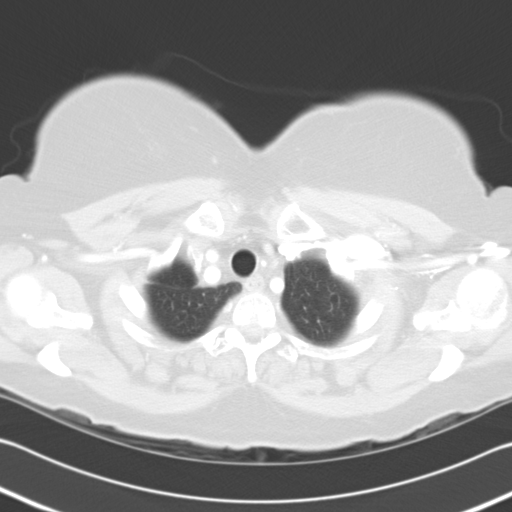

[Series 602: <mpr thick range> · coronal · 0.66mm/px · 3 of 74 slices shown]
[im 15/74  lung]
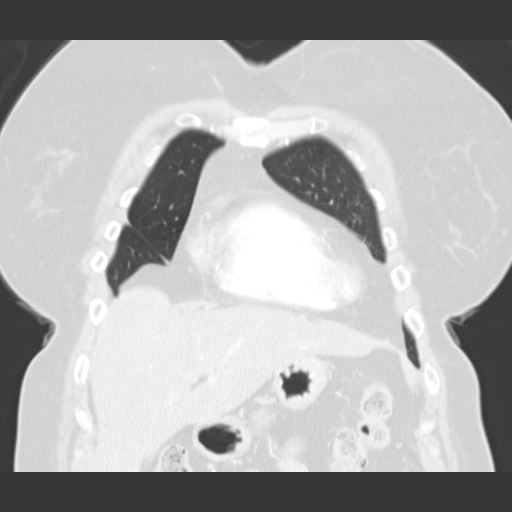
[im 30/74  lung]
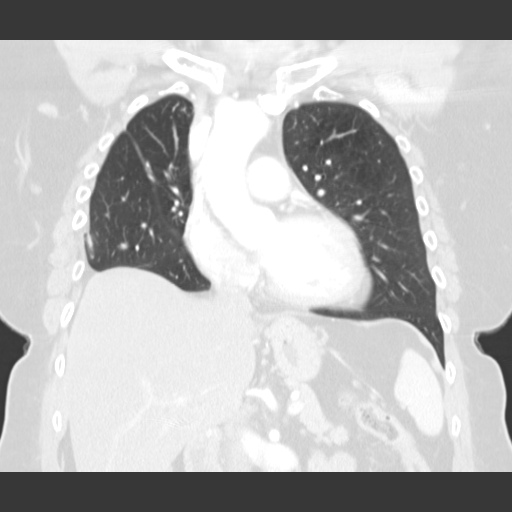
[im 44/74  lung]
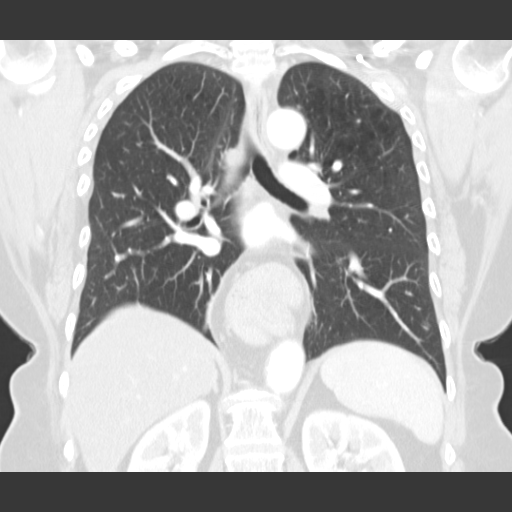

[15 of 36 positions shown; findings below may reference images not displayed]

Contrast:  80 cc Omnipaque 300

CHEST CT WITH CONTRAST:

There is no axillary, mediastinal, or hilar lymphadenopathy. No pericardial or
pleural effusion. Moderate hiatal hernia noted. Postoperative changes seen in
the right hilum and the lateral thoracic wall. Stable subpleural scarring in the
right lower lobe laterally. 6 mm left upper lobe pulmonary nodule on image 18
has progressed in the interval. Emphysema is noted in the upper lobes.
IMPRESSION: 6 mm left upper lobe nodule is more prominent than on the previous study.
Continued close attention on followup imaging is recommended.

Postoperative change in the right hemithorax.

Moderate hiatal hernia.

## 2008-07-16 LAB — CBC WITH DIFFERENTIAL (CANCER CENTER ONLY)
Eosinophils Absolute: 0.1 10*3/uL (ref 0.0–0.5)
HGB: 10.9 g/dL — ABNORMAL LOW (ref 11.6–15.9)
LYMPH#: 1.3 10*3/uL (ref 0.9–3.3)
MCH: 27.2 pg (ref 26.0–34.0)
MONO%: 3.6 % (ref 0.0–13.0)
NEUT#: 8.5 10*3/uL — ABNORMAL HIGH (ref 1.5–6.5)
Platelets: 220 10*3/uL (ref 145–400)
RBC: 3.99 10*6/uL (ref 3.70–5.32)
WBC: 10.4 10*3/uL — ABNORMAL HIGH (ref 3.9–10.0)

## 2008-07-17 LAB — COMPREHENSIVE METABOLIC PANEL
CO2: 23 mEq/L (ref 19–32)
Creatinine, Ser: 0.72 mg/dL (ref 0.40–1.20)
Glucose, Bld: 119 mg/dL — ABNORMAL HIGH (ref 70–99)
Total Bilirubin: 0.3 mg/dL (ref 0.3–1.2)

## 2008-07-17 LAB — VITAMIN D 25 HYDROXY (VIT D DEFICIENCY, FRACTURES): Vit D, 25-Hydroxy: 35 ng/mL (ref 30–89)

## 2008-08-12 ENCOUNTER — Ambulatory Visit: Payer: Self-pay | Admitting: Hematology & Oncology

## 2008-08-13 LAB — CBC WITH DIFFERENTIAL (CANCER CENTER ONLY)
BASO#: 0.1 10*3/uL (ref 0.0–0.2)
BASO%: 0.6 % (ref 0.0–2.0)
EOS%: 2.4 % (ref 0.0–7.0)
LYMPH#: 2.4 10*3/uL (ref 0.9–3.3)
MCH: 27.3 pg (ref 26.0–34.0)
MCHC: 32.8 g/dL (ref 32.0–36.0)
MONO%: 5.3 % (ref 0.0–13.0)
NEUT#: 4.7 10*3/uL (ref 1.5–6.5)
Platelets: 241 10*3/uL (ref 145–400)
RDW: 14 % (ref 10.5–14.6)

## 2008-08-13 LAB — RETICULOCYTES (CHCC): Retic Ct Pct: 1.4 % (ref 0.4–3.1)

## 2008-09-22 ENCOUNTER — Ambulatory Visit: Payer: Self-pay | Admitting: Hematology & Oncology

## 2008-10-28 IMAGING — CT CT CHEST W/ CM
2 of 3 series · 15 of 36 positions shown, 18 images · IV contrast (omnipaque)
Comparison: CT thorax, 08/15/2006

CLINICAL DATA: 56-year-old female with lung cancer.  Status post right lung resection with chemotherapy completed. 
CHEST CT WITH CONTRAST:
TECHNIQUE: Multidetector CT imaging of the chest was performed following the standard protocol during bolus administration of intravenous contrast.
Contrast:  80 cc Omnipaque 300

[Series 2: chest routine 5.0 b40f · axial · 0.66mm/px · z∈[-318,-63]mm · 12 of 61 slices shown, 15 images]
[im 5/61  mediastinal]
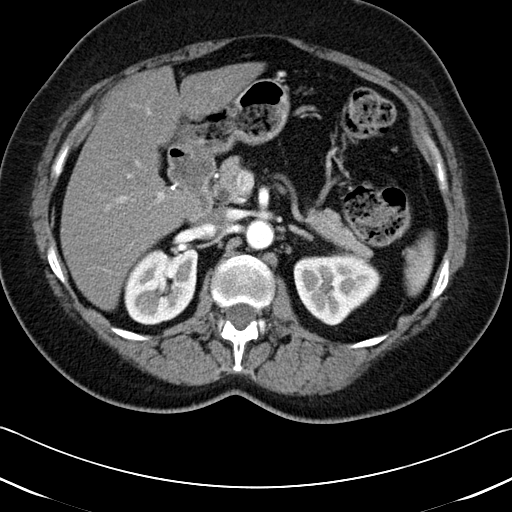
[im 5/61  lung]
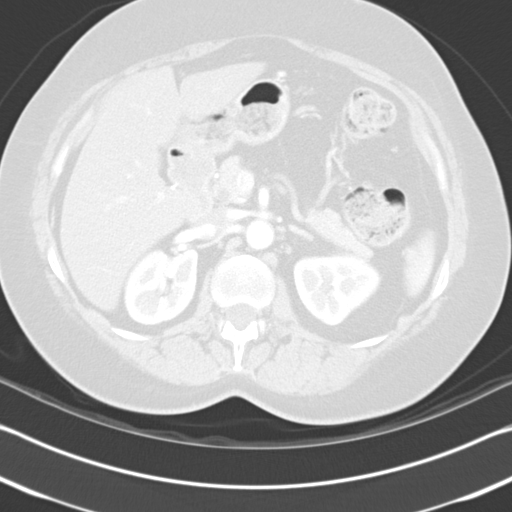
[im 9/61  lung]
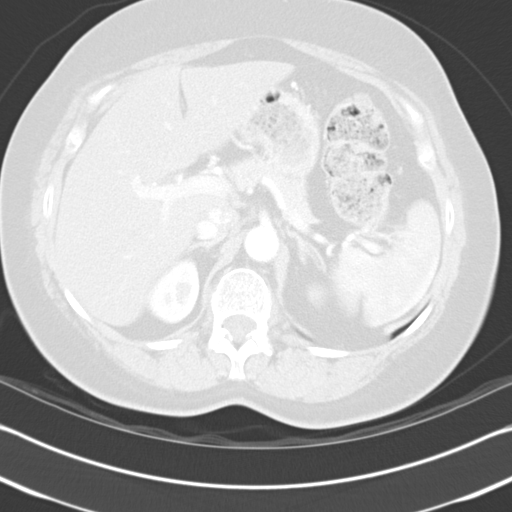
[im 14/61  lung]
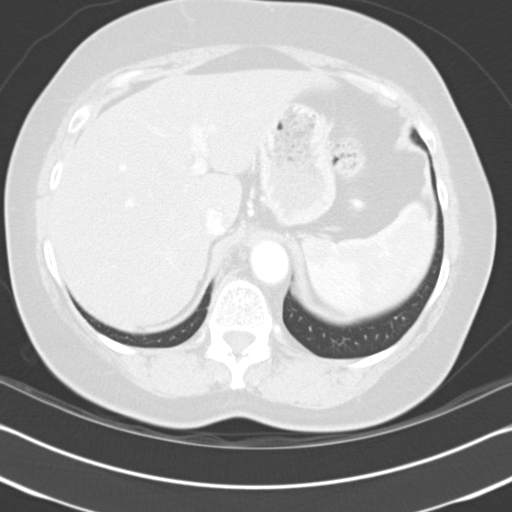
[im 18/61  lung]
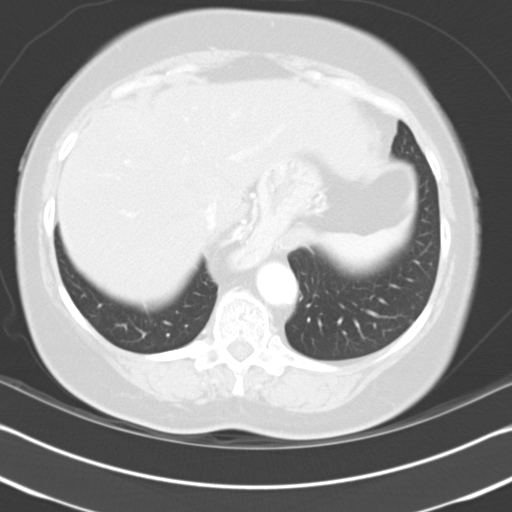
[im 23/61  mediastinal]
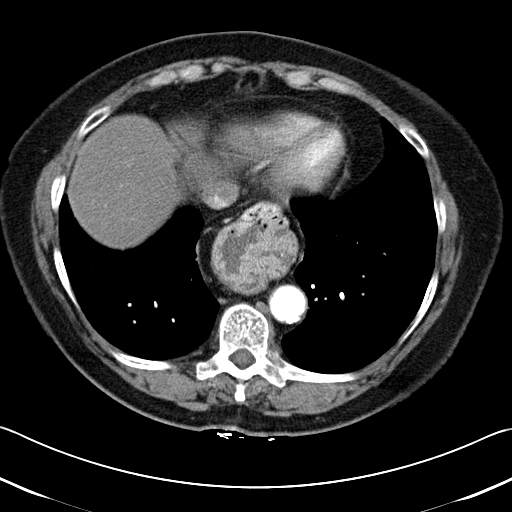
[im 23/61  lung]
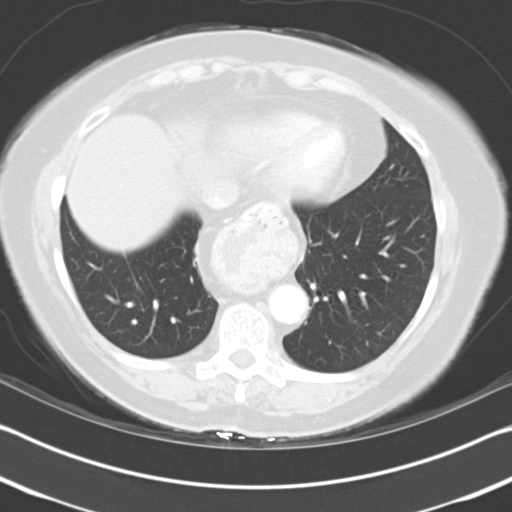
[im 27/61  lung]
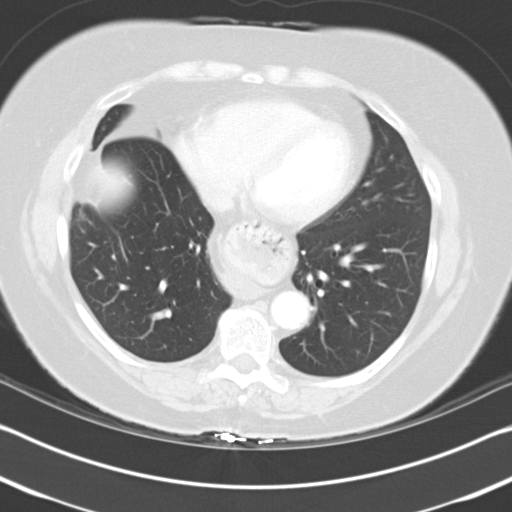
[im 34/61  lung]
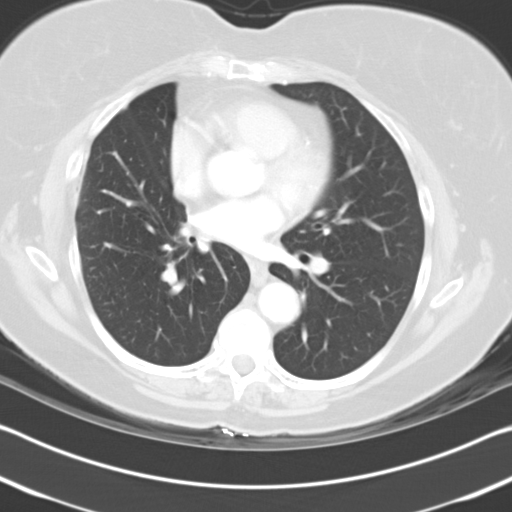
[im 38/61  lung]
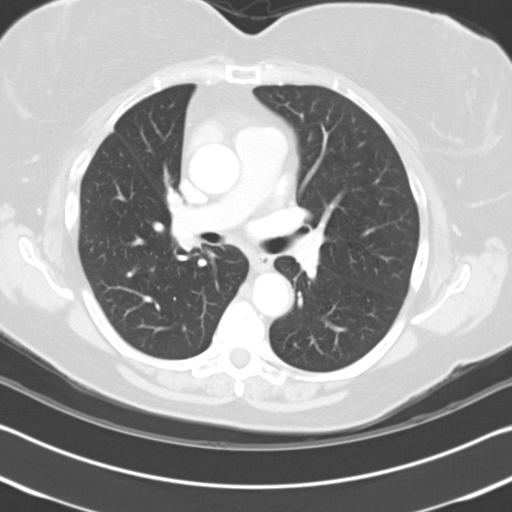
[im 43/61  mediastinal]
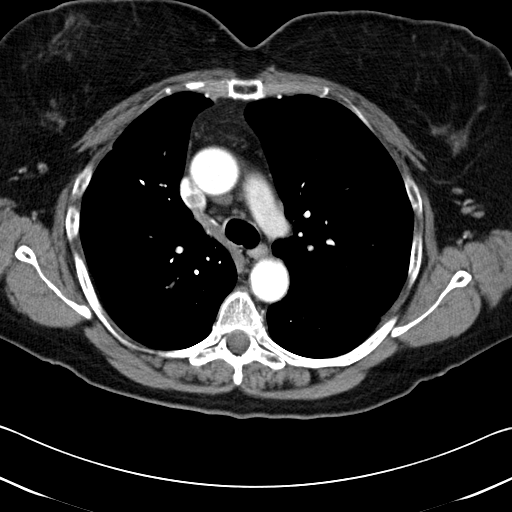
[im 43/61  lung]
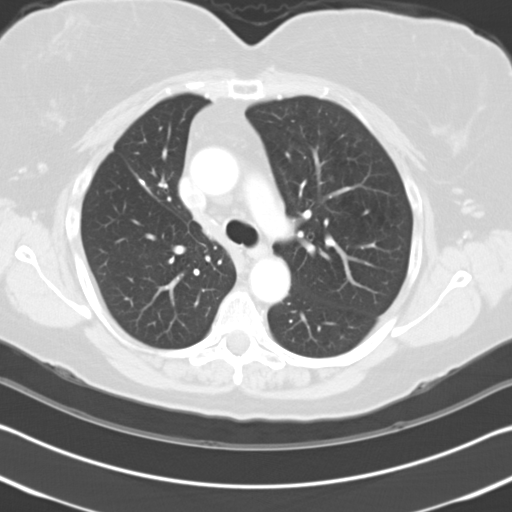
[im 47/61  lung]
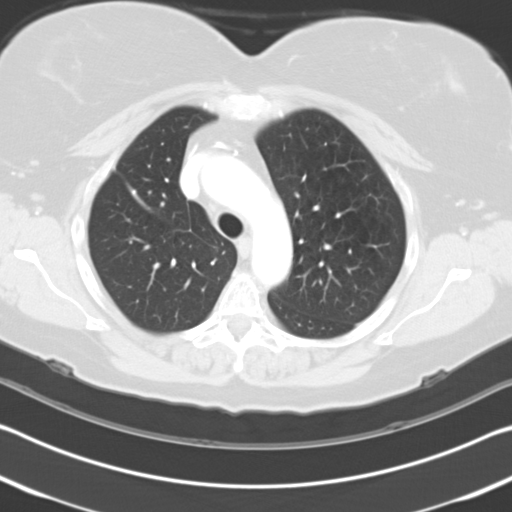
[im 52/61  lung]
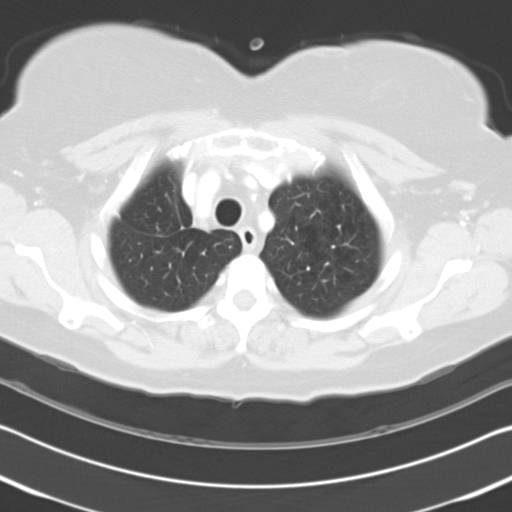
[im 56/61  lung]
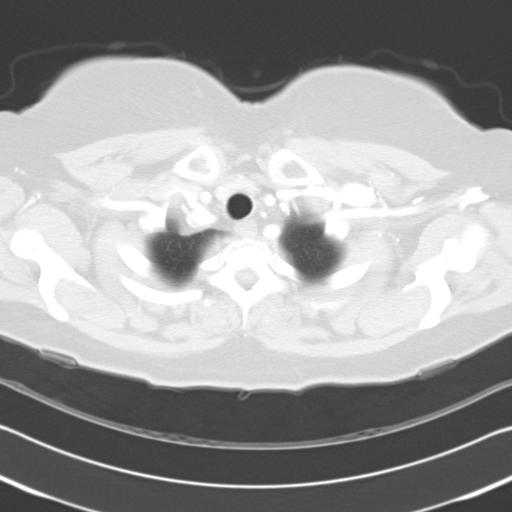

[Series 602: <mpr thick range> · coronal · 0.66mm/px · 3 of 84 slices shown]
[im 17/84  lung]
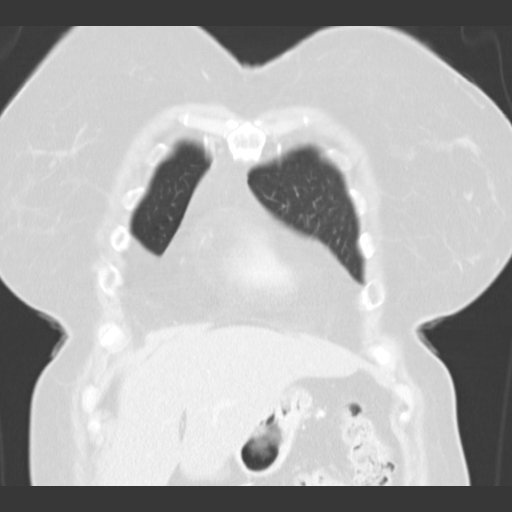
[im 34/84  lung]
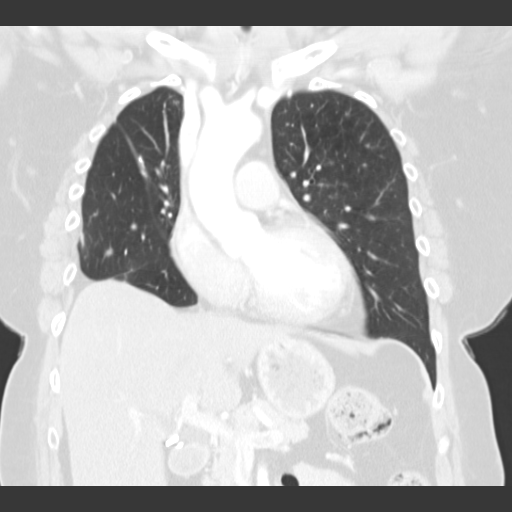
[im 50/84  lung]
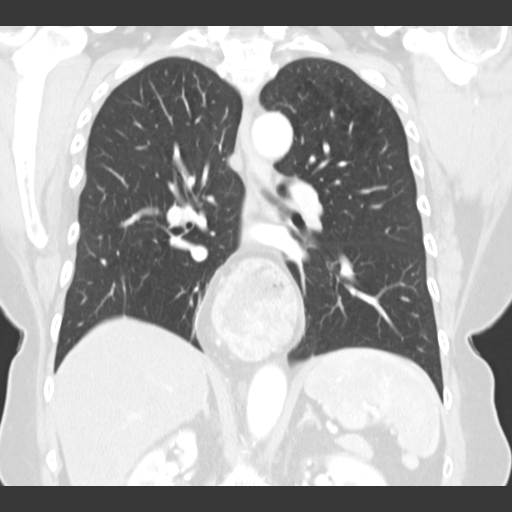

[15 of 36 positions shown; findings below may reference images not displayed]

FINDINGS: The small pulmonary nodule in the left upper lobe continues to increase in size now measuring approximately 9 mm x 8 mm compared to 6 mm x 6 mm on prior.  No additional suspicious pulmonary nodules.  There are post-surgical changes in the right lateral hemithorax with evidence of rib osteotomy and volume loss.  No evidence of mediastinal lymphadenopathy.  There are scattered subcentimeter paratracheal lymph nodes which are unchanged in size.  There is a large hiatal hernia.  No evidence of axillary or supraclavicular adenopathy.  Limited view of the upper abdomen demonstrates cholecystectomy.  Adrenal glands appear normal.
IMPRESSION: Continued increase in size of left upper lobe nodule which now measures approximately 9 mm x 8 mm compared to 6 mm x 6 mm on the prior.  Recommend  PET CT to further assess malignant potential.

## 2008-10-31 ENCOUNTER — Ambulatory Visit: Payer: Self-pay | Admitting: Hematology & Oncology

## 2008-11-03 LAB — CBC WITH DIFFERENTIAL (CANCER CENTER ONLY)
BASO#: 0.1 10*3/uL (ref 0.0–0.2)
BASO%: 0.8 % (ref 0.0–2.0)
EOS%: 5.5 % (ref 0.0–7.0)
HCT: 35.6 % (ref 34.8–46.6)
HGB: 12 g/dL (ref 11.6–15.9)
LYMPH#: 2.5 10*3/uL (ref 0.9–3.3)
LYMPH%: 30 % (ref 14.0–48.0)
MCH: 27.5 pg (ref 26.0–34.0)
MCHC: 33.8 g/dL (ref 32.0–36.0)
MONO%: 4.8 % (ref 0.0–13.0)
NEUT%: 58.9 % (ref 39.6–80.0)
RDW: 13.4 % (ref 10.5–14.6)

## 2008-11-03 LAB — COMPREHENSIVE METABOLIC PANEL
ALT: 15 U/L (ref 0–35)
CO2: 25 mEq/L (ref 19–32)
Calcium: 9.4 mg/dL (ref 8.4–10.5)
Chloride: 104 mEq/L (ref 96–112)
Creatinine, Ser: 0.78 mg/dL (ref 0.40–1.20)
Glucose, Bld: 80 mg/dL (ref 70–99)
Sodium: 140 mEq/L (ref 135–145)
Total Protein: 7 g/dL (ref 6.0–8.3)

## 2008-11-09 IMAGING — CT NM PET TUM IMG SKULL BASE T - THIGH
6 series · 25 of 25 positions shown · IV contrast (350 OM)
Comparison: Chest CT 12/14/06.

Addendum BeginsOriginal report by Dr. Azerba[place] Afonya.  Following addendum by Dr. Mathiesen on 12/28/06: 
 The impression identifies a 9 mm right upper lobe nodule.  This is incorrect. The nodule is in the LEFT upper lobe as specified in the findings.
CLINICAL DATA: 56-year-old female with prior history of lung cancer and enlarging left upper lobe nodule. 
FDG PET-CT TUMOR IMAGING (SKULL BASE TO THIGHS):
Fasting Blood Glucose:  99
TECHNIQUE: 14.8 mCi F-18 FDG were administered via left antecubital fossa.  Full ring PET imaging was performed from the skull base through the mid-thighs 71 minutes after injection.  CT data was obtained and used for attenuation correction and anatomic localization only.  (This was not acquired as a diagnostic CT examination.)

[Series 1: pet ac · axial · 3.3mm · 4.69mm/px · z∈[-870,+0]mm · 5 of 267 slices shown]
[im 1/267]
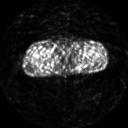
[im 67/267]
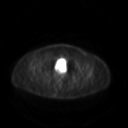
[im 134/267]
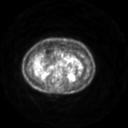
[im 200/267]
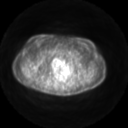
[im 267/267]
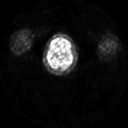

[Series 2: ct images · axial · 3.8mm · 0.98mm/px · z∈[-870,+0]mm · 6 of 267 slices shown]
[im 1/267]
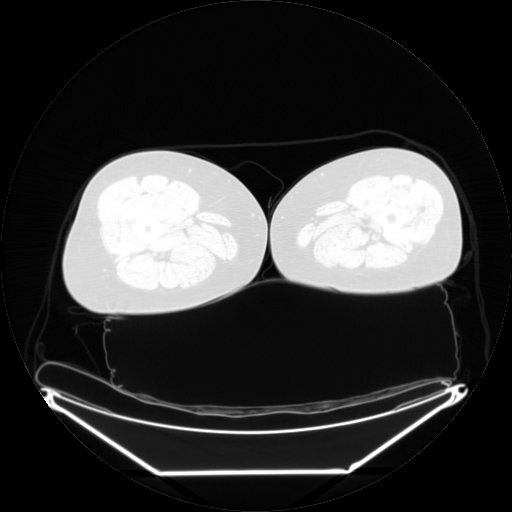
[im 54/267]
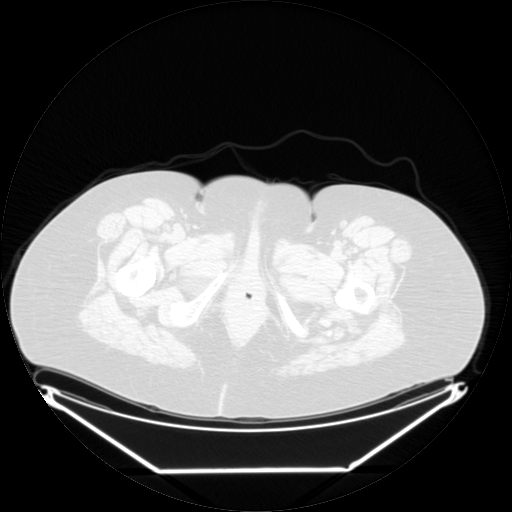
[im 107/267]
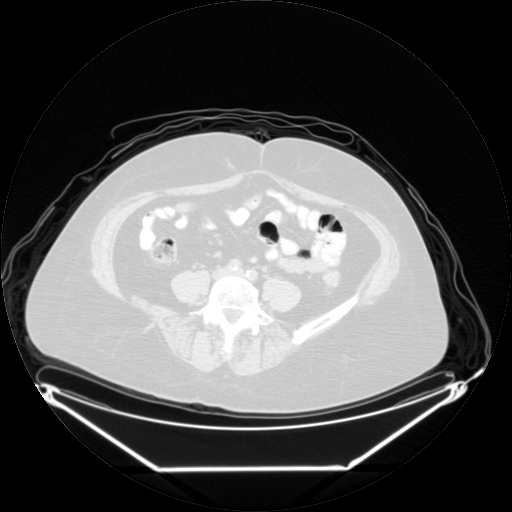
[im 160/267]
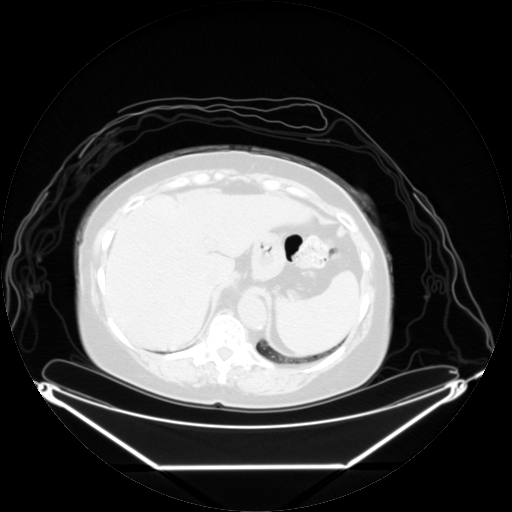
[im 213/267]
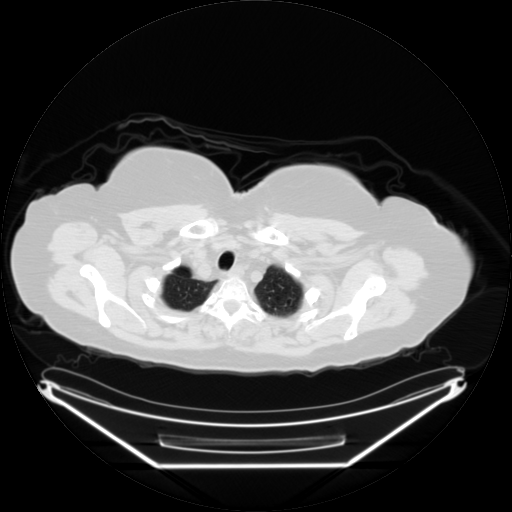
[im 267/267  brain]
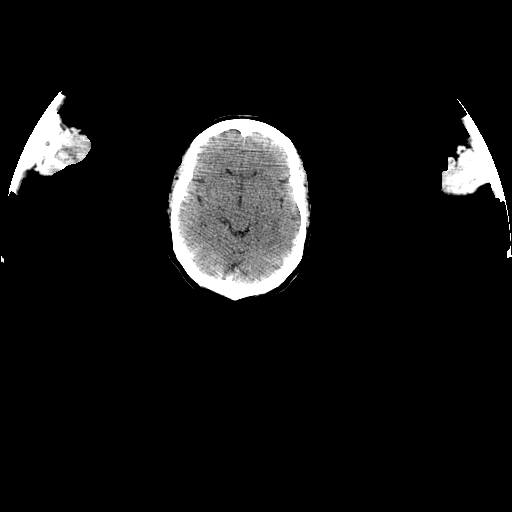

[Series 2: pet nac · axial · 3.3mm · 4.69mm/px · z∈[-870,+0]mm · 6 of 267 slices shown]
[im 1/267]
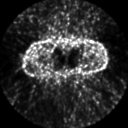
[im 54/267]
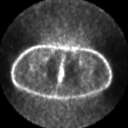
[im 107/267]
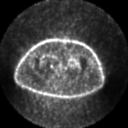
[im 160/267]
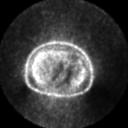
[im 213/267]
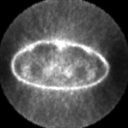
[im 267/267]
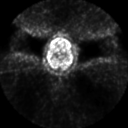

[Series 123: mip · coronal · 3.3mm · 4.69mm/px · 1 of 30 slices shown]
[im 1/30]
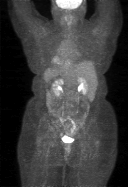

[Series 151: reformatted · axial · 3.3mm · 3.91mm/px · z∈[-870,+0]mm · 6 of 265 slices shown (1 of 2)]
[im 1/265]
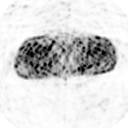
[im 53/265]
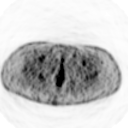
[im 106/265]
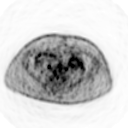
[im 159/265]
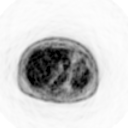
[im 212/265]
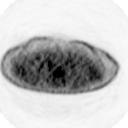
[im 265/265]
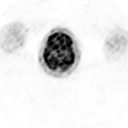

[Series 153: reformatted · coronal · 4.7mm · 6.98mm/px · 1 of 66 slices shown (2 of 2)]
[im 1/66]
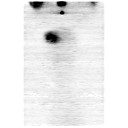

[25 of 25 positions shown; findings below may reference images not displayed]

IMPRESSION: Mildly hypermetabolic 9 mm LEFT upper lobe nodule.

 Addendum Ends
FINDINGS: Neck:  No hypermetabolic lymph nodes of the neck.  
Thorax:  There is mild FDG activity associated with the 9 x 7 mm left upper lobe pulmonary nodule, (image 70) with SUV max equal 1.5.  This lesion has not increased in size in the short interval follow-up.  No additional hypermetabolic nodules within the left or right lung.  No hypermetabolic mediastinal or hilar lymphadenopathy.  
Abdomen:  No abnormal hypermetabolic activity within the solid organs.  The adrenal glands are normal.  No hypermetabolic lymph nodes within the abdomen or pelvis.  Review of the bones demonstrates no focal FDG activity to suggest bone metastasis.
IMPRESSION: 1.  Low level FDG activity (SUV max 1.5) associated with 9 mm right upper lobe nodule.  This nodule remains indeterminate.  Although this activity is below the level typically associated with malignancy, the small size of the lesion with associated activity remains worrisome.  Recommend follow-up PET CT scan in 3 months to assess increase in metabolic activity.  
2.  No evidence of additional metastasis in the chest, abdomen, or pelvis.

## 2008-11-17 ENCOUNTER — Encounter: Admission: RE | Admit: 2008-11-17 | Discharge: 2008-11-17 | Payer: Self-pay | Admitting: Hematology & Oncology

## 2008-11-22 IMAGING — CR DG CHEST 2V
2 series · 2 of 2 positions shown · non-contrast
Comparison: CT dated 12/14/06

CLINICAL DATA: Lung lesion.  
 CHEST - 2 VIEW:

[view not recorded (1 of 2)]
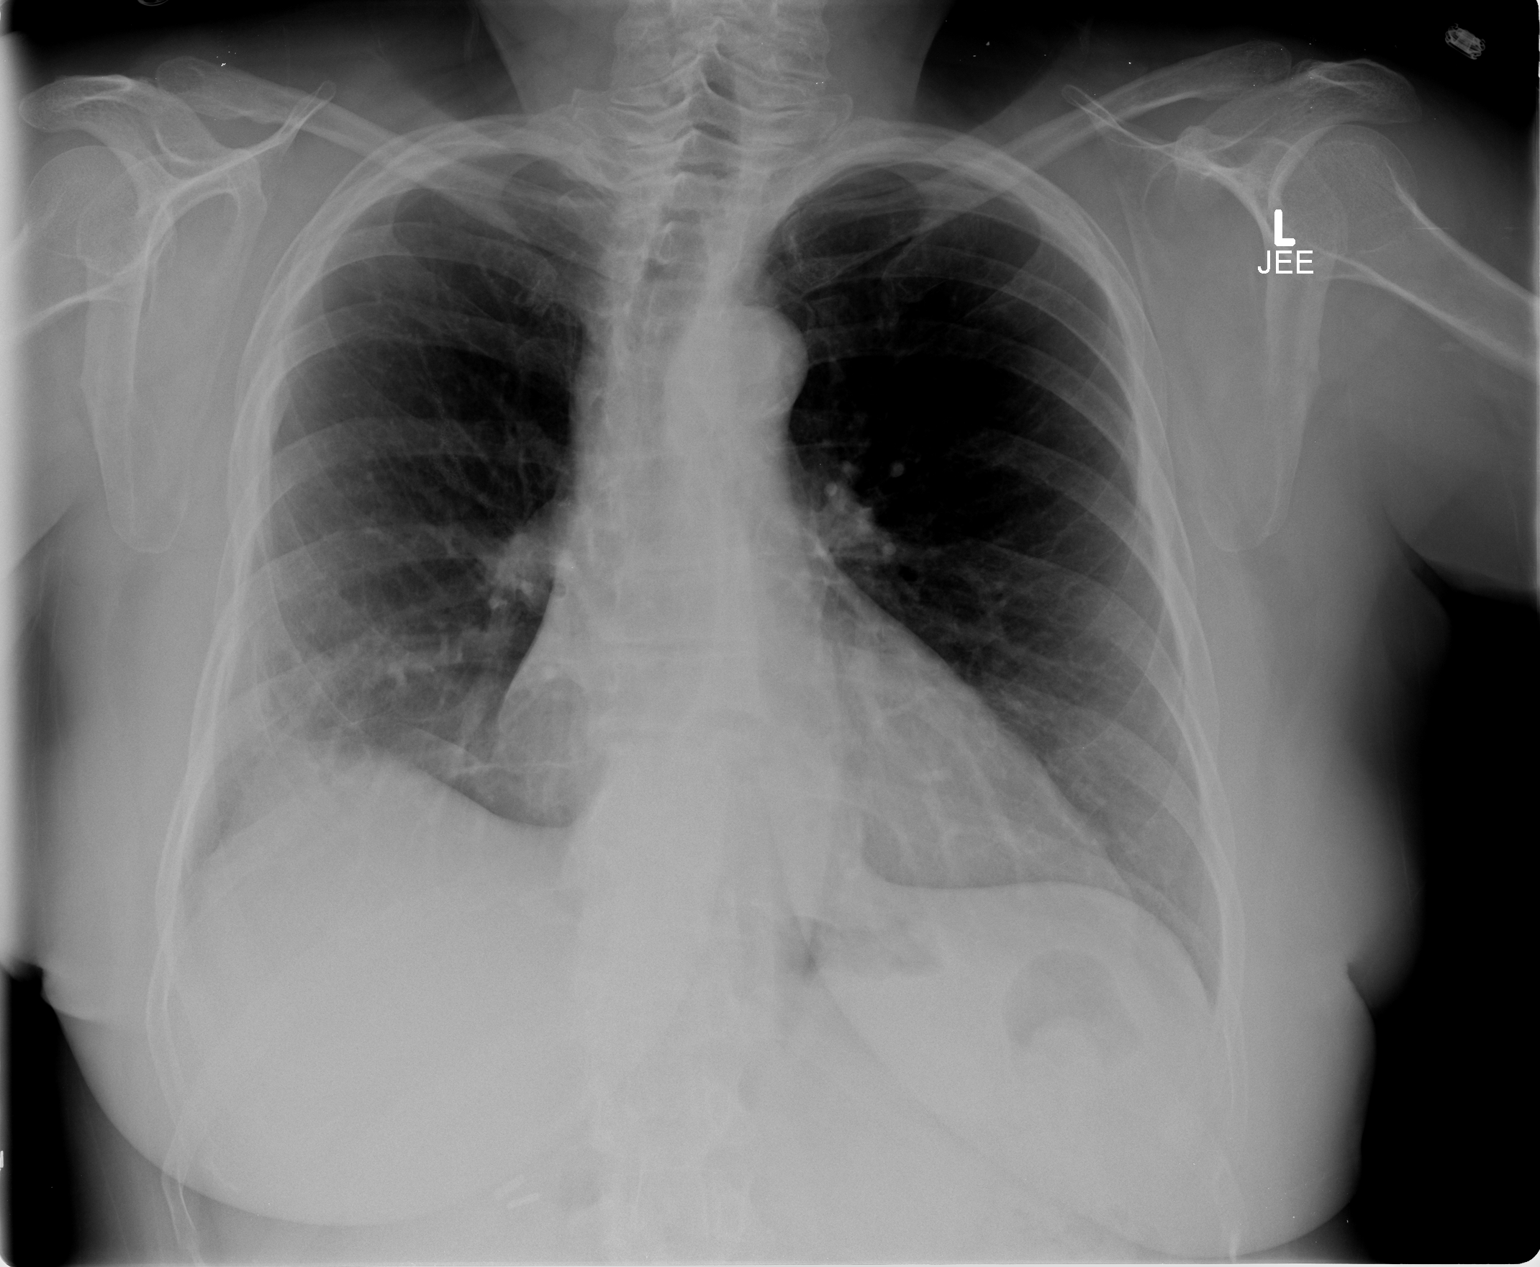

[view not recorded (2 of 2)]
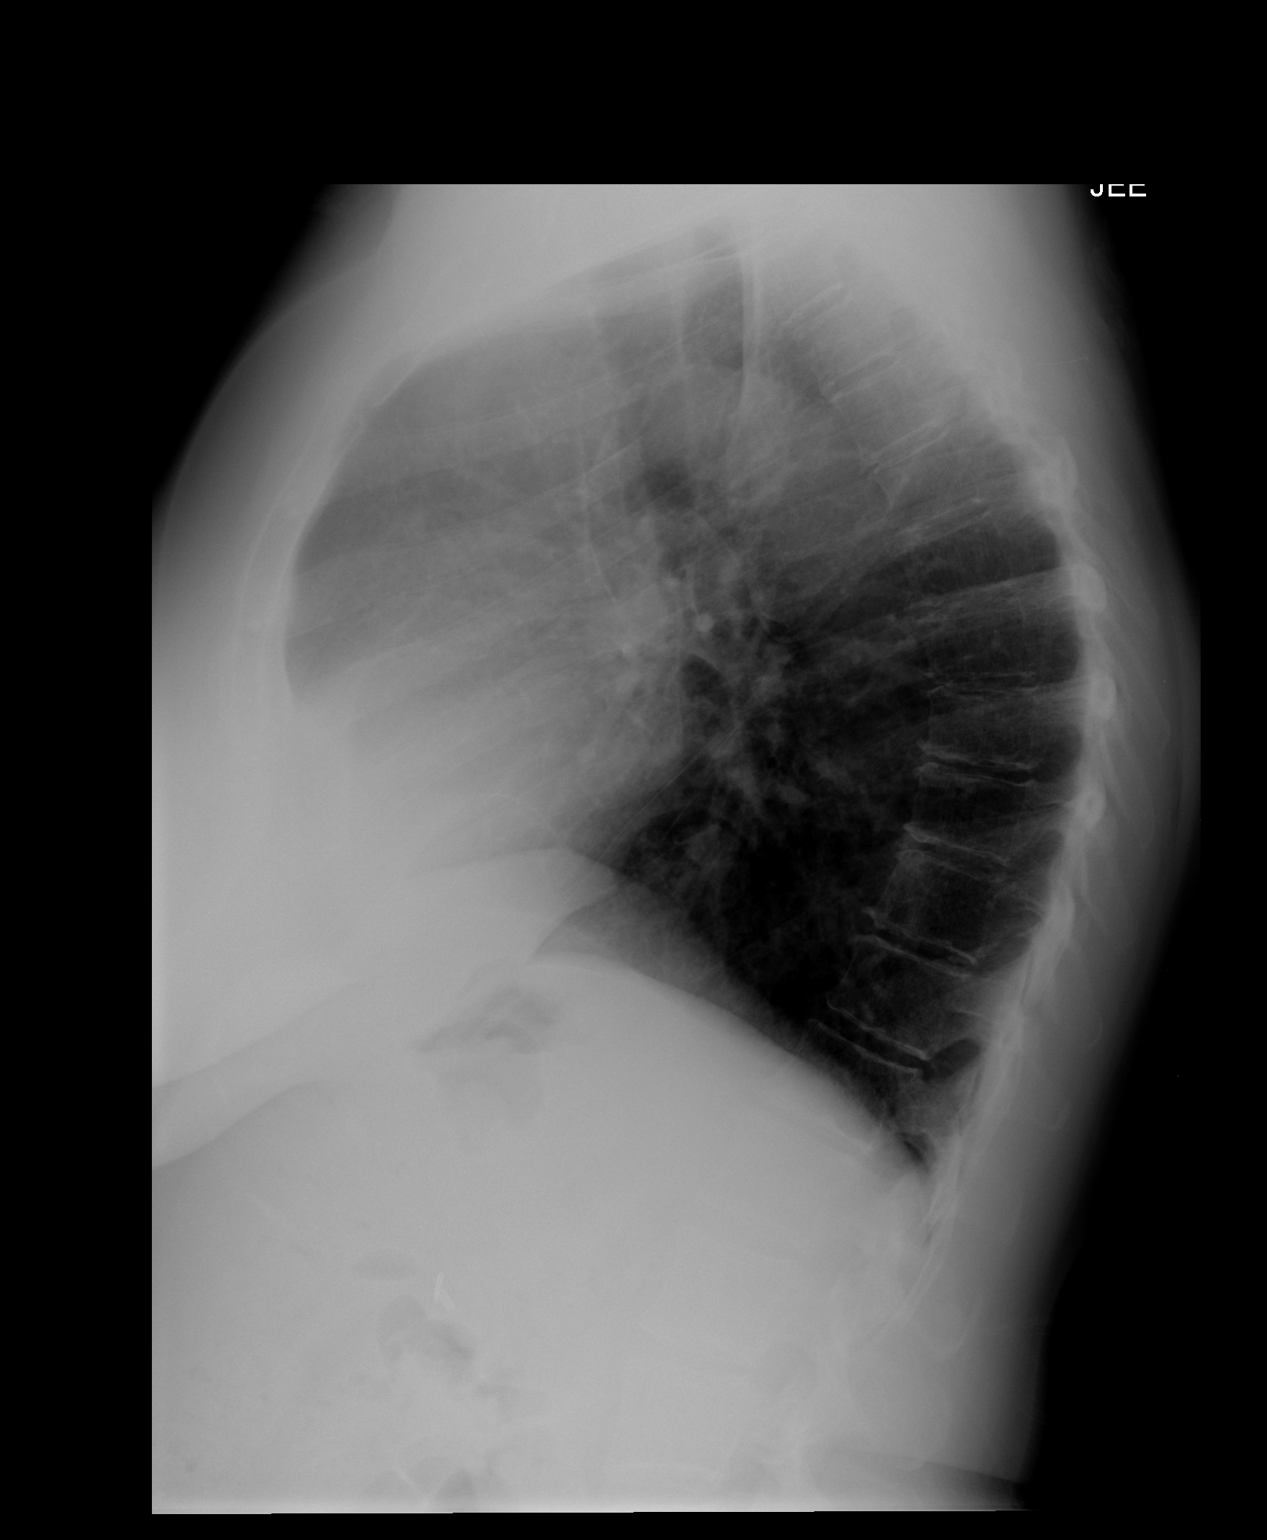

[2 of 2 positions shown; findings below may reference images not displayed]

FINDINGS: Heart size is normal.  The mediastinal contours are unremarkable.  There is no pleural fluid. 
 Post-operative changes including volume loss are identified at the right base.   The left lung nodule is occult on this plain film radiograph.
IMPRESSION: 1.  No acute cardiopulmonary abnormalities. 
 2.  Stable chronic changes at right base.

## 2008-11-26 IMAGING — CR DG CHEST 1V PORT
1 series · 1 of 1 positions shown · non-contrast
Comparison: 01/11/07.

CLINICAL DATA: Postop VATS.
 PORTABLE CHEST - 1 VIEW ? 01/12/07 AT 0400 HOURS:

[view not recorded]
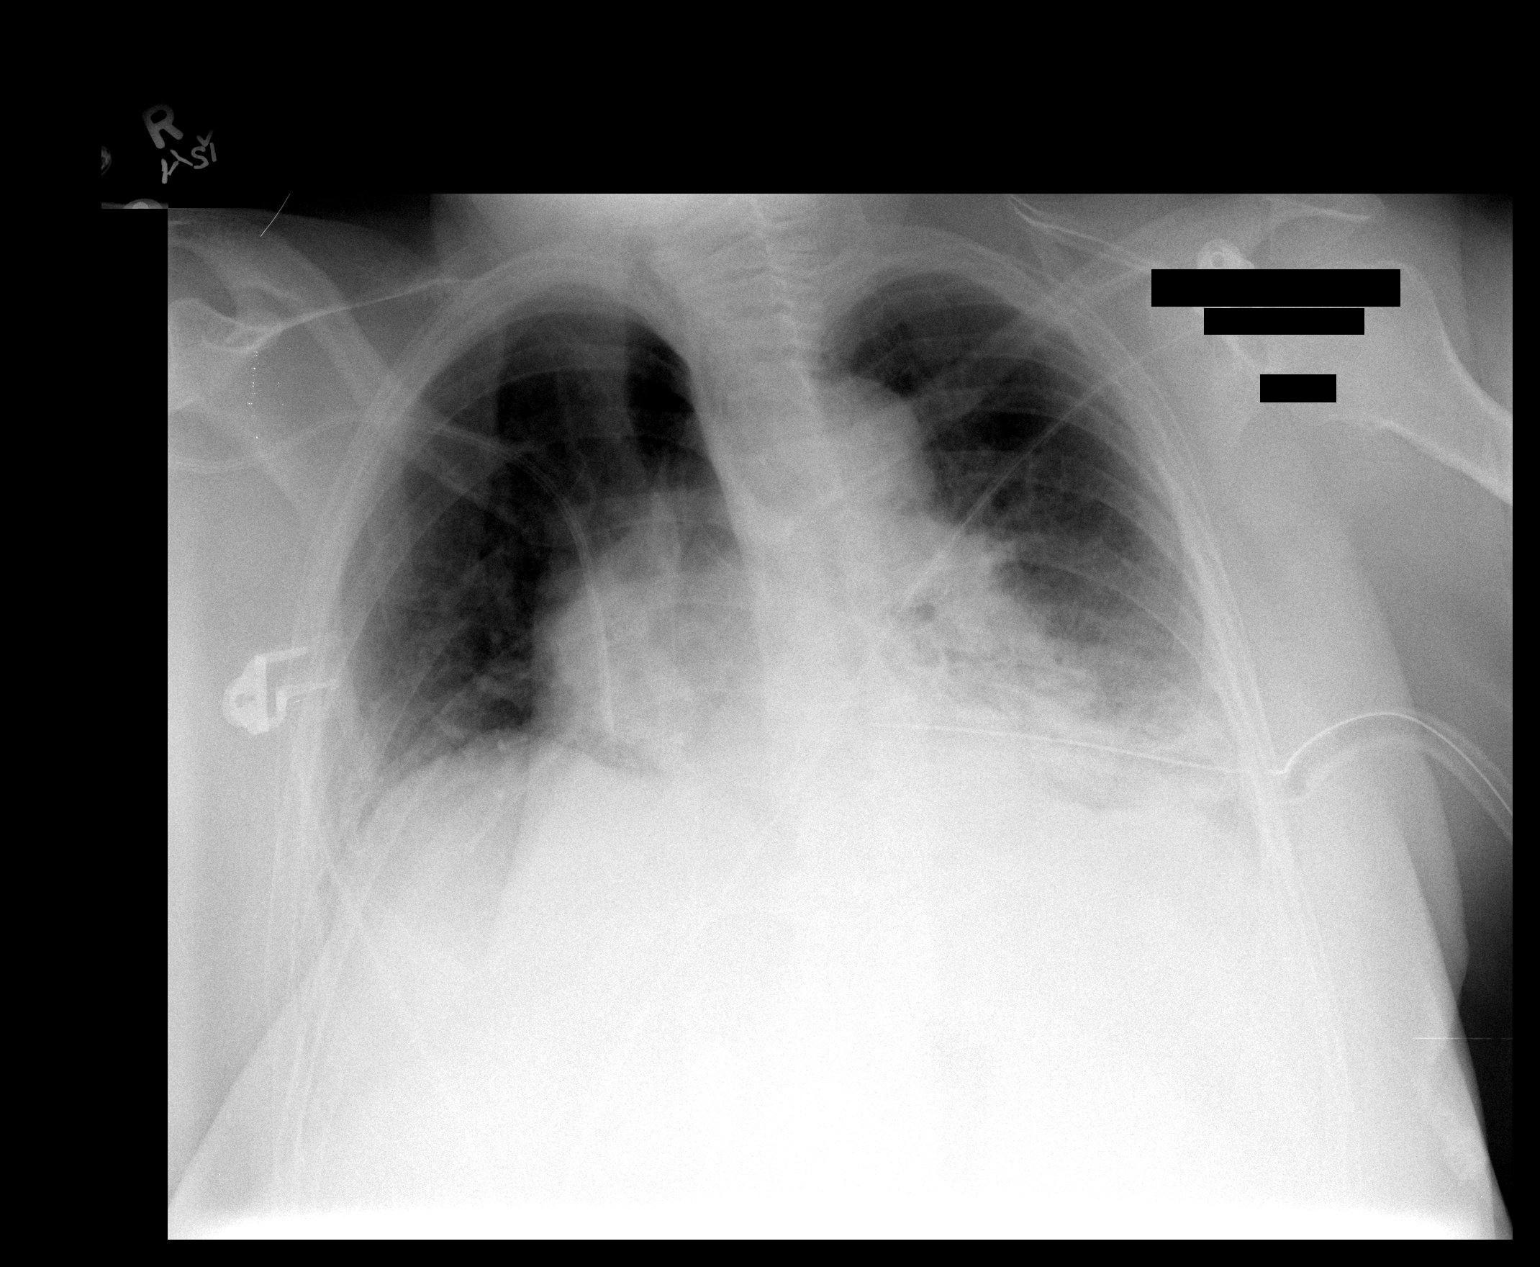

[1 of 1 positions shown; findings below may reference images not displayed]

FINDINGS: The patient is rotated to the right. Central venous catheter and left chest tube appear in stable position.  There are lower lung volumes with increased atelectasis at both lung bases.  No pneumothorax is seen. The heart size and mediastinal contours appear stable allowing for the lower lung volumes and rotation.
IMPRESSION: Stable support system and lines.  Increased basilar atelectasis. No pneumothorax.

## 2008-11-26 IMAGING — CR DG CHEST 1V PORT
1 series · 1 of 1 positions shown · non-contrast
Comparison: 01/12/07 at [DATE] a.m.

CLINICAL DATA: Lung lesion. Chest tube removal. 
PORTABLE CHEST - 1 VIEW ? 01/12/07 AT 5075 HOURS:

[AP]
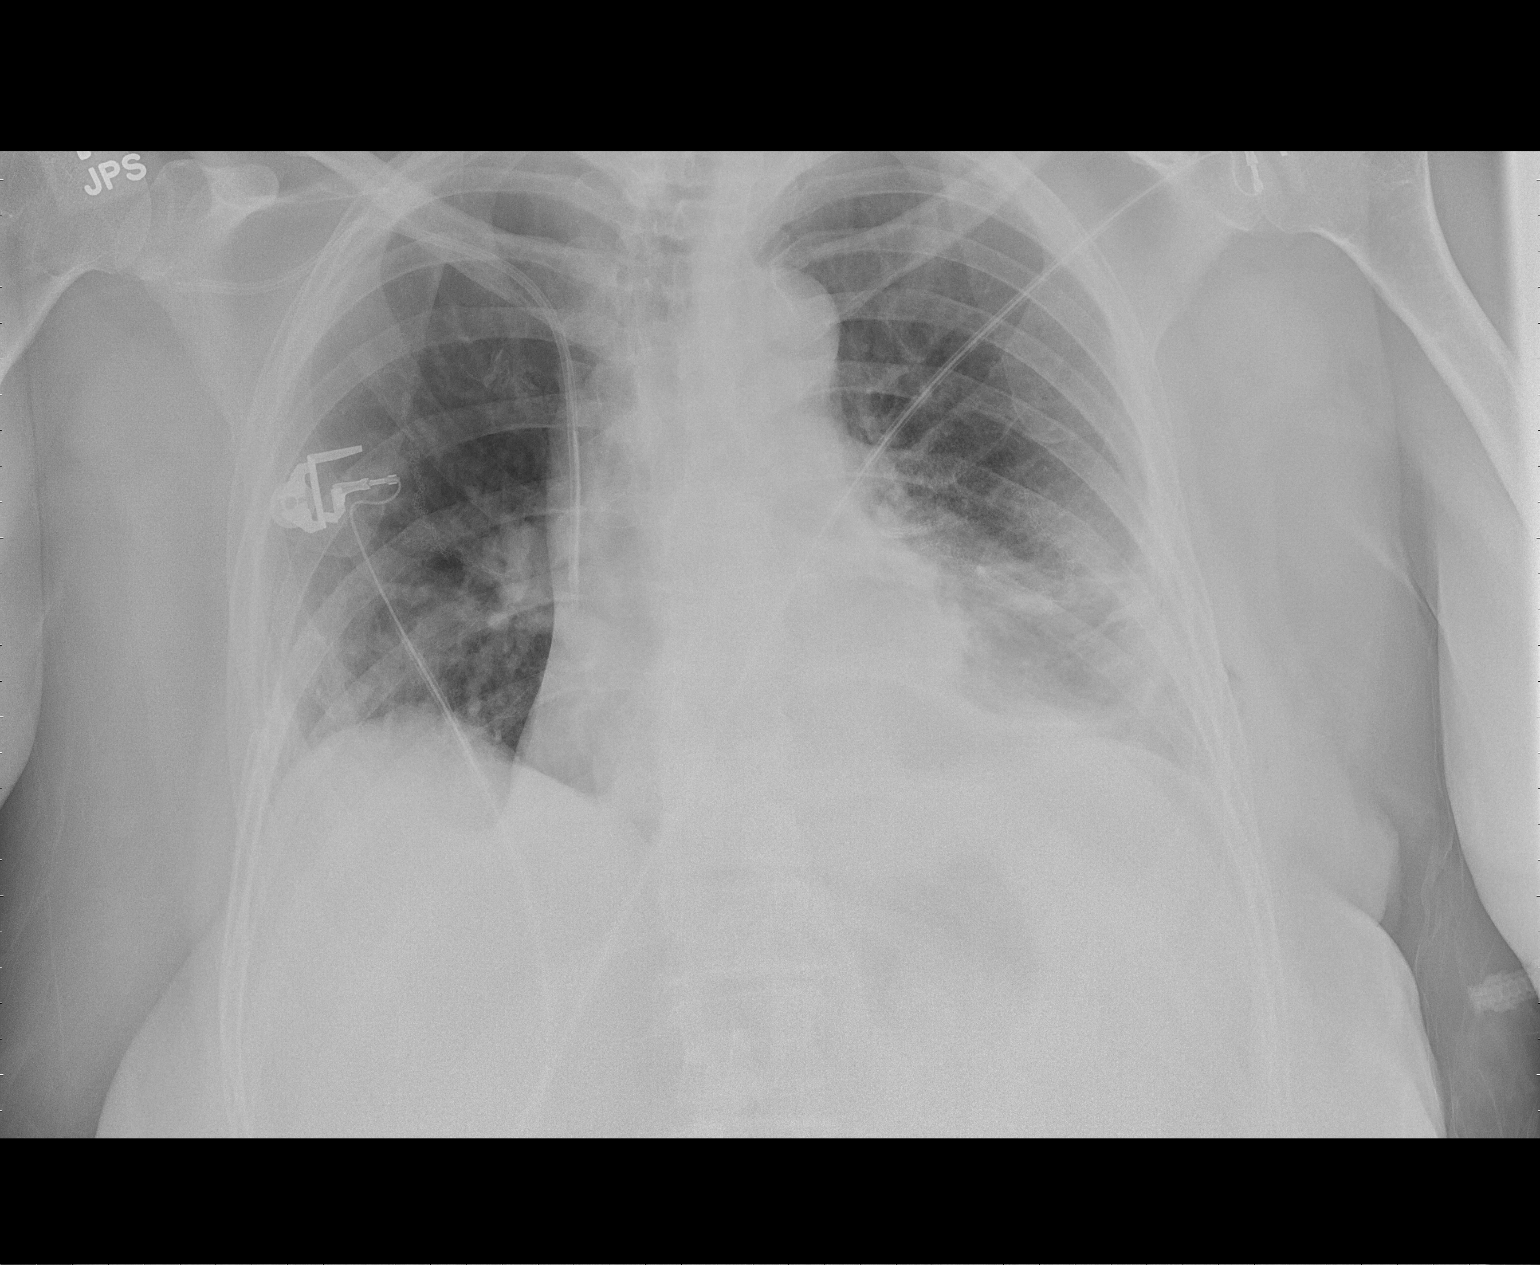

[1 of 1 positions shown; findings below may reference images not displayed]

FINDINGS: Left-sided chest tube has been removed. No pneumothorax. Postsurgical changes left mid to lower lung zone with lobular contour adjacent to the descending aorta. This may repsent hiatal hernia, which, was noted on chest CT of 12/14/06.  No aneurysm was seen in this region on the CT.  Postsurgical changes/atelectasis/infiltrate may contribute to this appearance. Right central line tip distal superior vena cava. Postsurgical changes right lung. Mild subsegmental atelectatic changes right base.
IMPRESSION: 1.  Removal of left-sided chest tube without evidence of left-sided pneumothorax. 
2.  Basilar atelectatic changes left greater than right.
3.  Rounded contour adjacent to the descending aorta may be related to atelectatic changes. This can be evaluated on followup.

## 2008-11-27 IMAGING — CR DG CHEST 2V
2 series · 2 of 2 positions shown · non-contrast
Comparison: 01/12/07

CLINICAL DATA: Lung lesion.
 [id number] VIEWS:

[w chest pa]
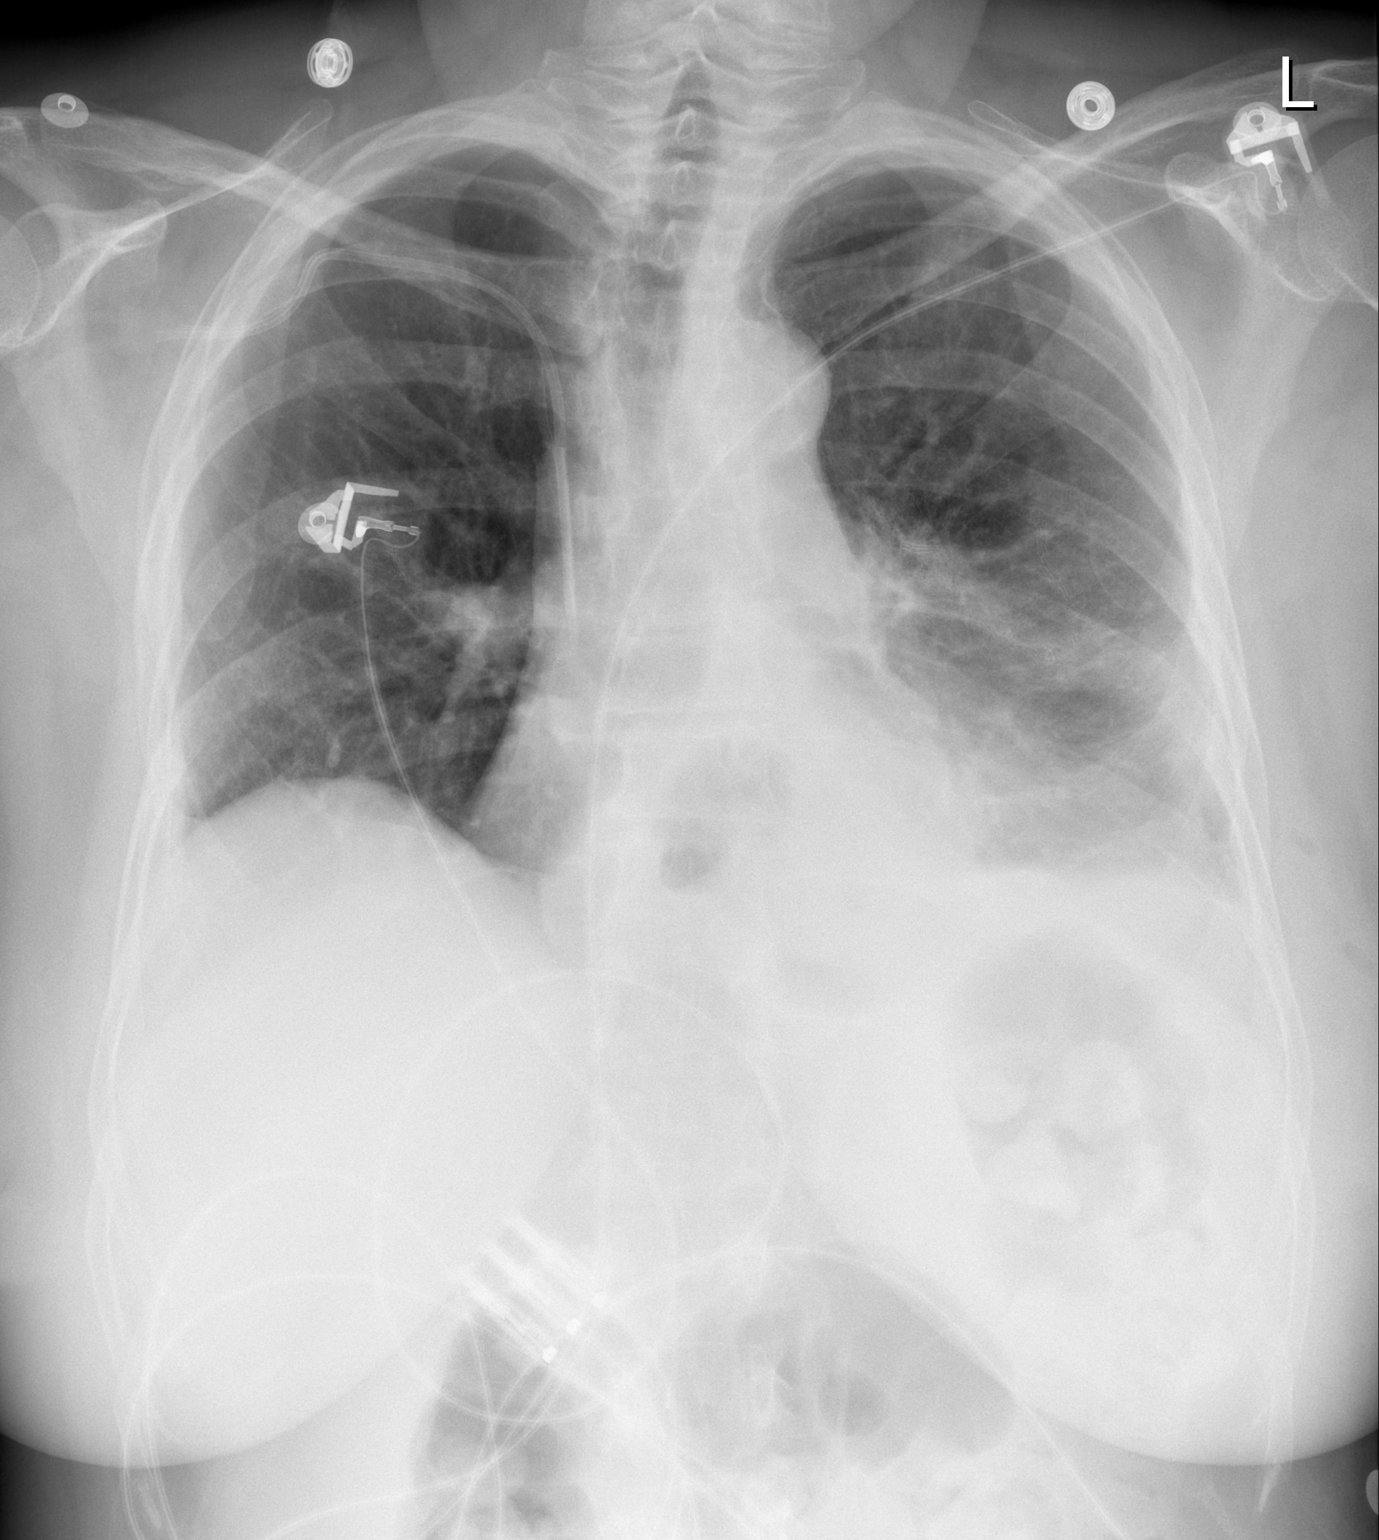

[w chest lat]
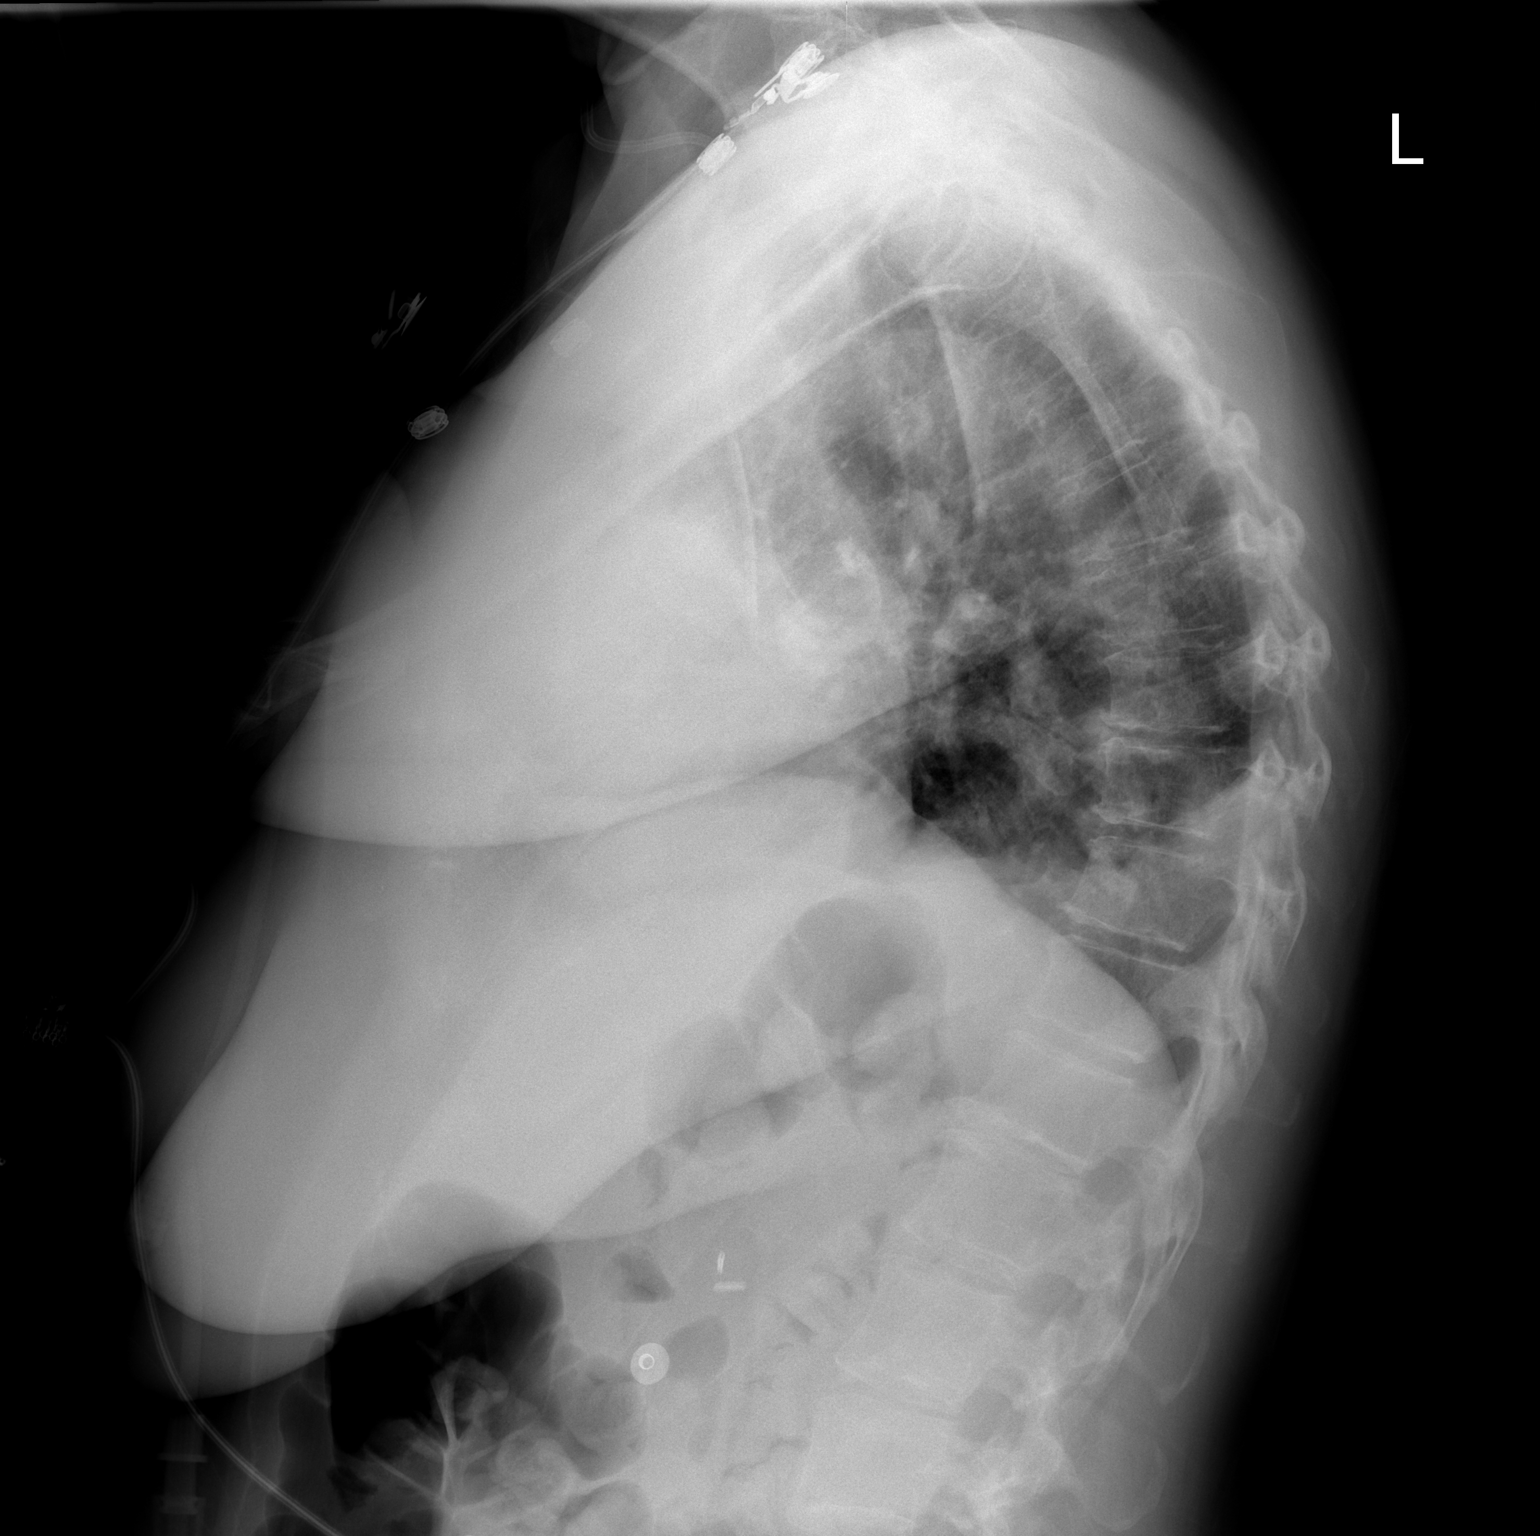

[2 of 2 positions shown; findings below may reference images not displayed]

FINDINGS: Left basilar airspace disease is stable.  The right subclavian central venous catheter is stable.  Right basilar atelectasis is stable.  The heart is normal in size.  No pneumothorax. 
 Upon further review, a tiny left apical pneumothorax is present.
IMPRESSION: No significant interval change.
 Upon further review, a tiny left apical pneumothorax is present.

## 2008-12-09 IMAGING — CR DG CHEST 2V
2 series · 2 of 2 positions shown · non-contrast
Comparison: none

CLINICAL DATA: Lung lesion. 
 CHEST, TWO VIEWS:

[view not recorded (1 of 2)]
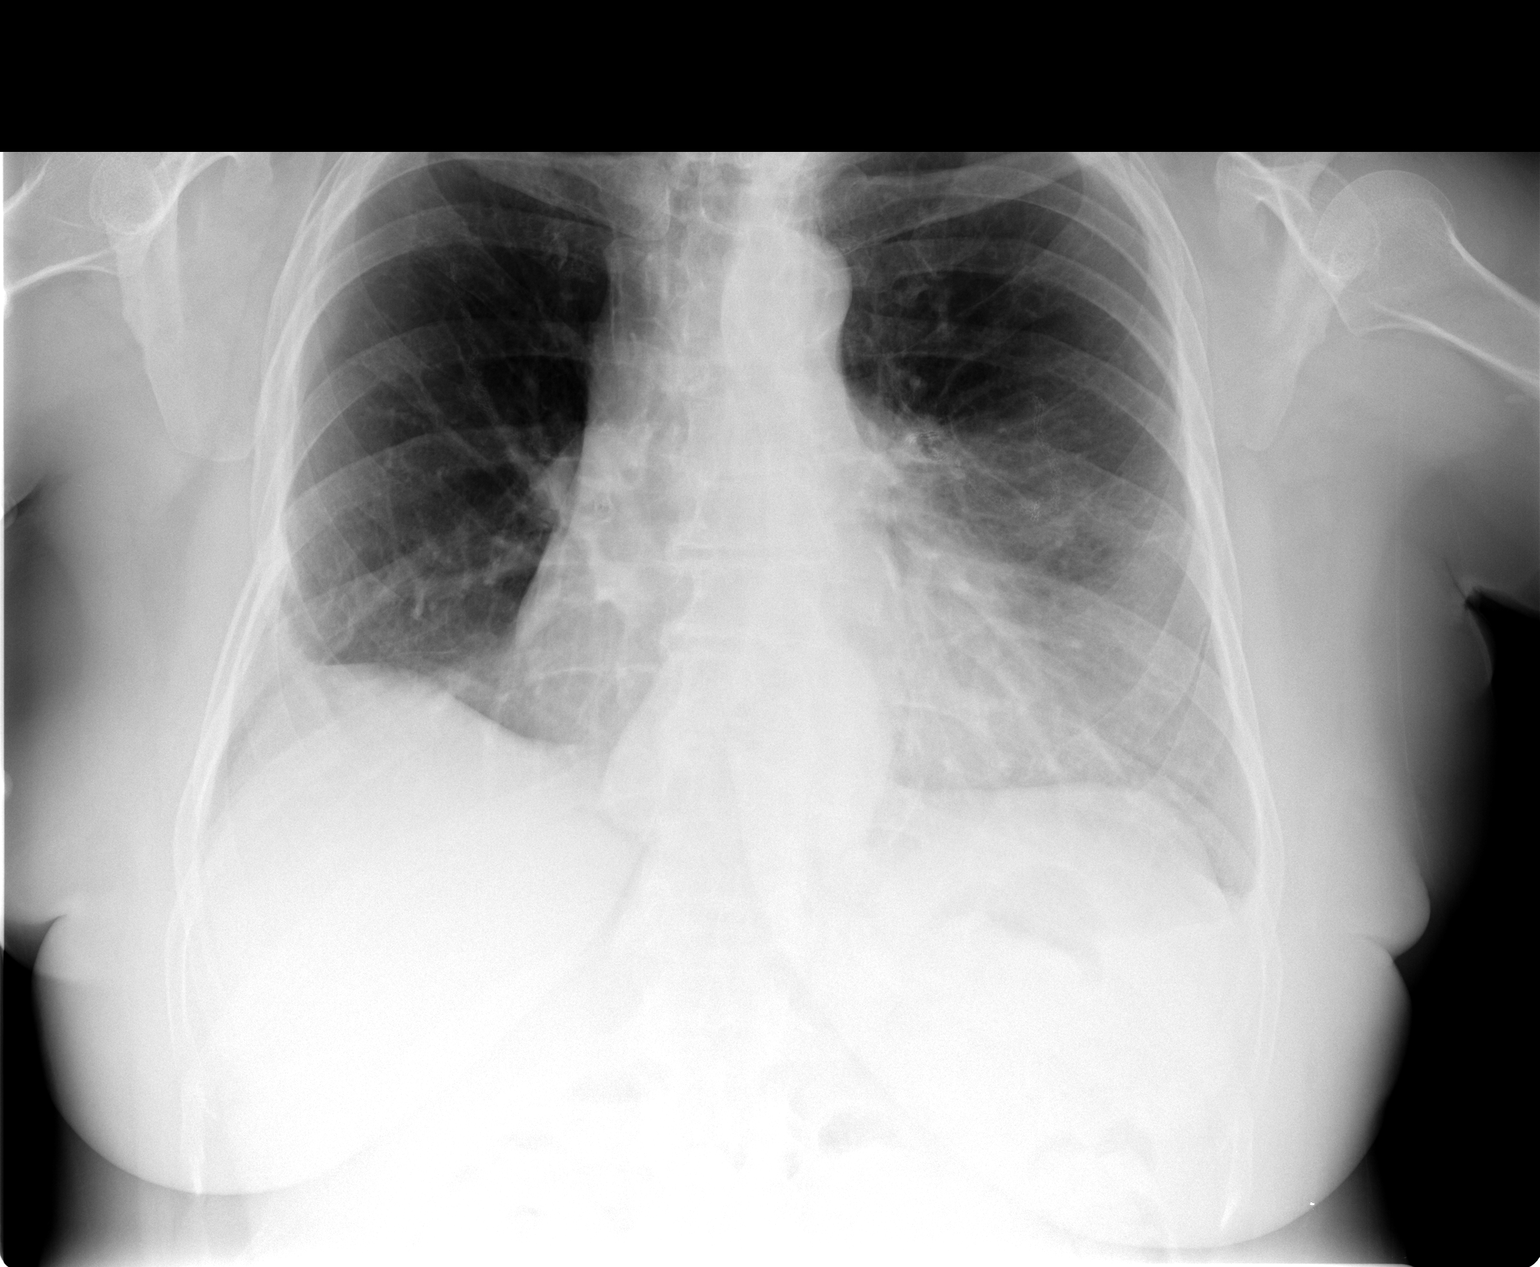

[view not recorded (2 of 2)]
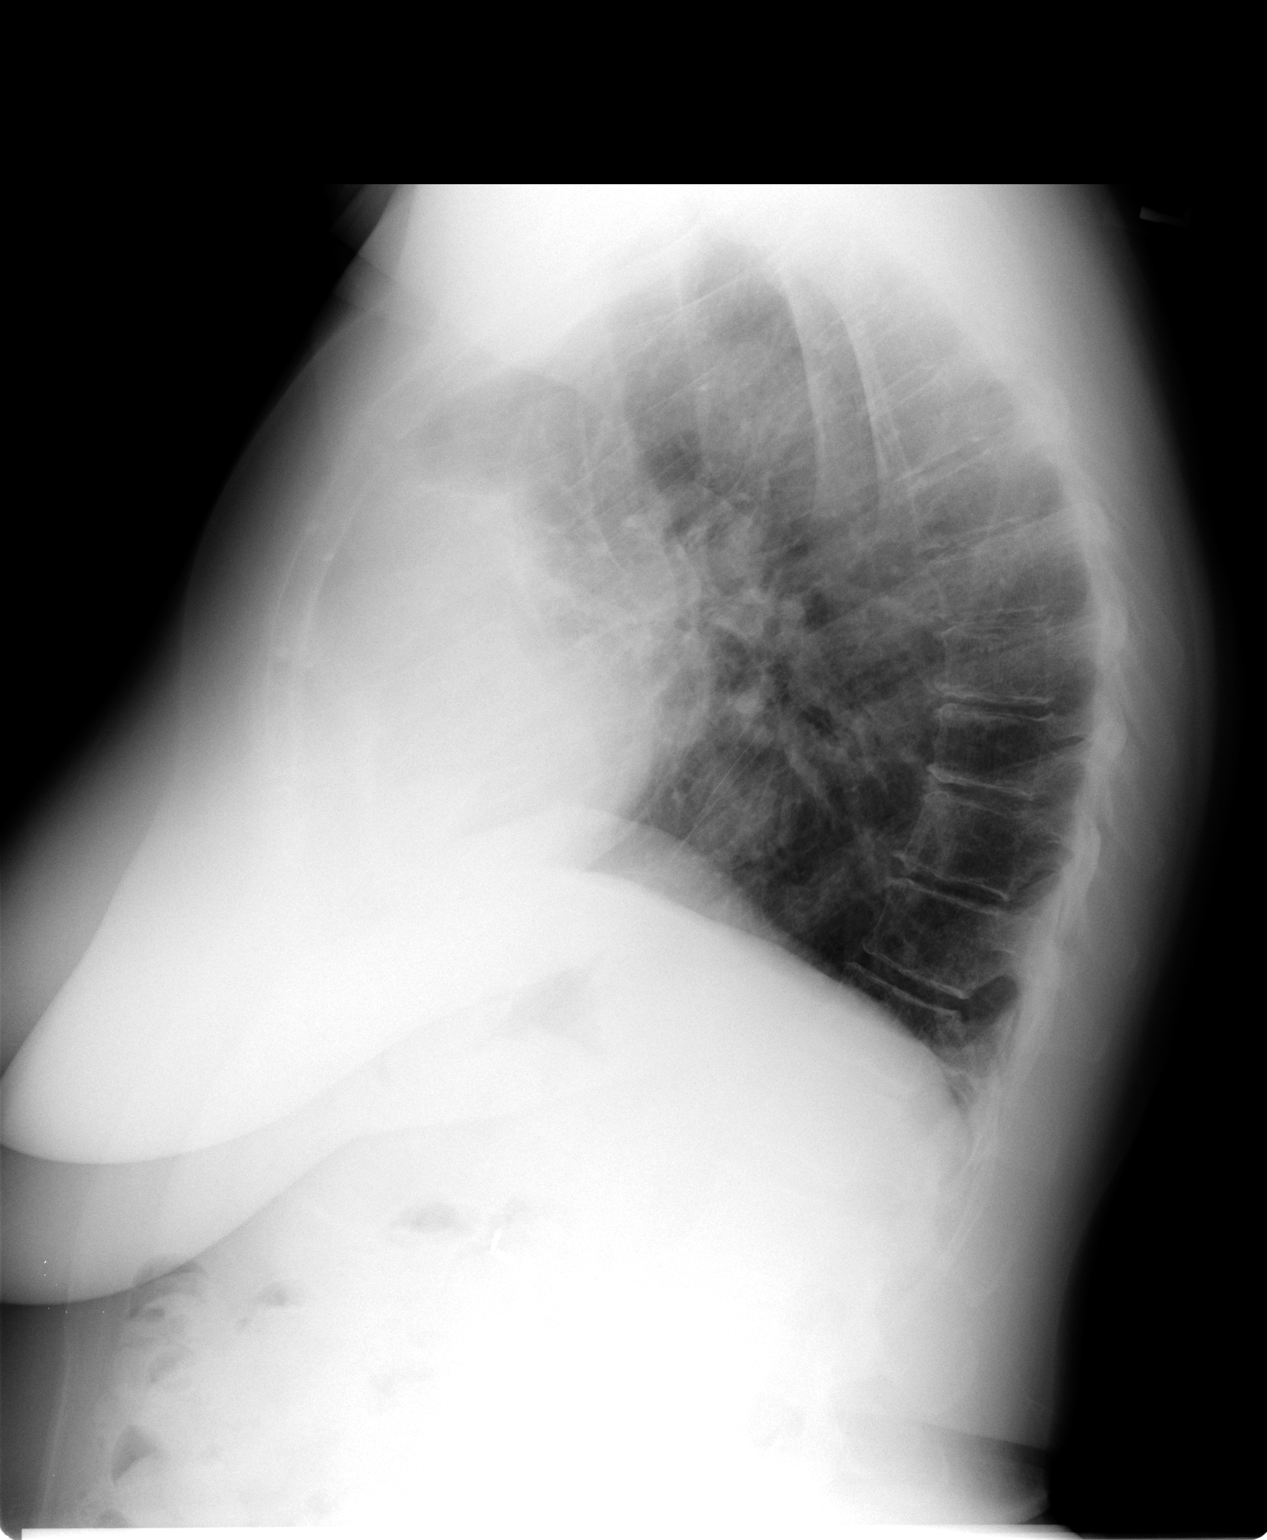

[2 of 2 positions shown; findings below may reference images not displayed]

FINDINGS: The left pneumothorax has resolved.  The thorax remains prominent.  Pulmonary vascularity is within normal limits.  Basilar atelectasis has improved.  No effusions are seen.  A hiatal hernia is noted.
IMPRESSION: Resolved pneumothorax.  Improved bibasilar atelectasis.  Hiatal hernia is noted.

## 2009-01-06 ENCOUNTER — Ambulatory Visit: Payer: Self-pay | Admitting: Hematology & Oncology

## 2009-01-07 LAB — CBC WITH DIFFERENTIAL (CANCER CENTER ONLY)
BASO#: 0 10*3/uL (ref 0.0–0.2)
BASO%: 0.5 % (ref 0.0–2.0)
EOS%: 3.4 % (ref 0.0–7.0)
HCT: 35.6 % (ref 34.8–46.6)
HGB: 12.3 g/dL (ref 11.6–15.9)
LYMPH%: 38.2 % (ref 14.0–48.0)
MCH: 27.4 pg (ref 26.0–34.0)
MCHC: 34.6 g/dL (ref 32.0–36.0)
MONO%: 4.6 % (ref 0.0–13.0)
NEUT%: 53.3 % (ref 39.6–80.0)
RDW: 14.9 % — ABNORMAL HIGH (ref 10.5–14.6)

## 2009-01-08 LAB — COMPREHENSIVE METABOLIC PANEL
AST: 14 U/L (ref 0–37)
Alkaline Phosphatase: 75 U/L (ref 39–117)
BUN: 16 mg/dL (ref 6–23)
Calcium: 9.4 mg/dL (ref 8.4–10.5)
Creatinine, Ser: 0.68 mg/dL (ref 0.40–1.20)
Total Bilirubin: 0.3 mg/dL (ref 0.3–1.2)

## 2009-01-08 LAB — VITAMIN D 25 HYDROXY (VIT D DEFICIENCY, FRACTURES): Vit D, 25-Hydroxy: 34 ng/mL (ref 30–89)

## 2009-01-13 IMAGING — US IR US GUIDE VASC ACCESS RIGHT
1 series · 1 of 1 positions shown · non-contrast
Comparison: none

DUPLICATE COPY for exam association in [facility] - no change from original report, 03/09/07.
CLINICAL DATA: Recurrent lung carcinoma.  Patient requires Port-A-Cath to begin chemotherapy treatments.  She has had a previous left subclavian Port-A-Cath in the past which was removed secondary to constant nerve irritation and chest pain.
 IMPLANTED CENTRAL VENOUS PORT-A-CATH PLACEMENT WITH ULTRASOUND AND FLUOROSCOPIC GUIDANCE:
 Prior to the procedure, informed consent was obtained.  The patient received 1 gm IV Ancef.  
 Sedation:  3 mg IV Versed, 150 micrograms IV fentanyl.  
 Total IV sedation time:  One hour.
 The right neck and chest were sterilely prepped and draped.  Local anesthesia was provided with 1% lidocaine.  A venotomy incision was created at the base of the neck.  Under ultrasound guidance, a 21 gauge needle was advanced into the right internal jugular vein.  After securing guidewire access, an 8 French dilator was advanced into the superior vena cava.  A J-wire was kinked to measure the appropriate length of the catheter.
 A subcutaneous port pocket was formed along the right upper chest wall utilizing sharp and blunt dissection.  Portable cautery was utilized. The pocket was irrigated with sterile saline.  A single lumen pfm power injectable Port-A-Cath was placed.  The 8 French catheter was tunneled from the port pocket the venotomy incision.  The port was situated in the pocket and secure with Ethilon tacking sutures.  At the venotomy, an 8 French peel-away sheath was placed.  The catheter was fed through the sheath, and final catheter positioning confirmed with a fluoroscopic spot image.  The catheter was accessed, aspirated and flushed with saline, and injected with a heparinized saline flush.  The needle was removed.  Both the port pocket incision and venotomy incision were closed with subcutaneous 3-0 Monocryl, subcuticular 4-0 Vicryl, and Dermabond.  
 Complications:  None.  No pneumothorax.

[Series 1: ir us guide vasc access right · 0.06mm/px · 1 of 1 slices shown]
[im 1/1]
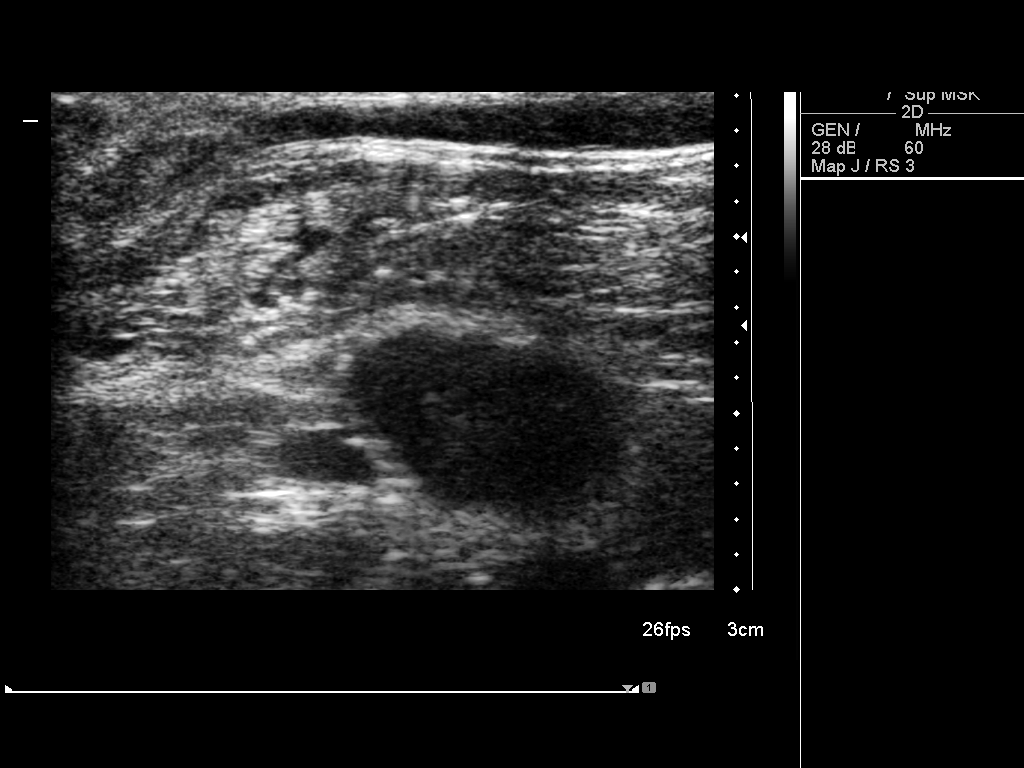

[1 of 1 positions shown; findings below may reference images not displayed]

FINDINGS: After catheter placement, the catheter tip lies at the cavoatrial junction.  The catheter aspirates and flushes normally and is ready for immediate use.
IMPRESSION: Single lumen Port-A-Cath placement under fluoroscopic and ultrasound guidance via right internal jugular vein.  The catheter tip lies at the right cavoatrial junction.  The catheter has been flushed and is ready for immediate use.

## 2009-01-31 IMAGING — CR DG CHEST 2V
2 series · 2 of 2 positions shown · non-contrast
Comparison: 01/25/07.

CLINICAL DATA: Lung lesion, postop. 
 CHEST ? 2 VIEW:

[w chest pa]
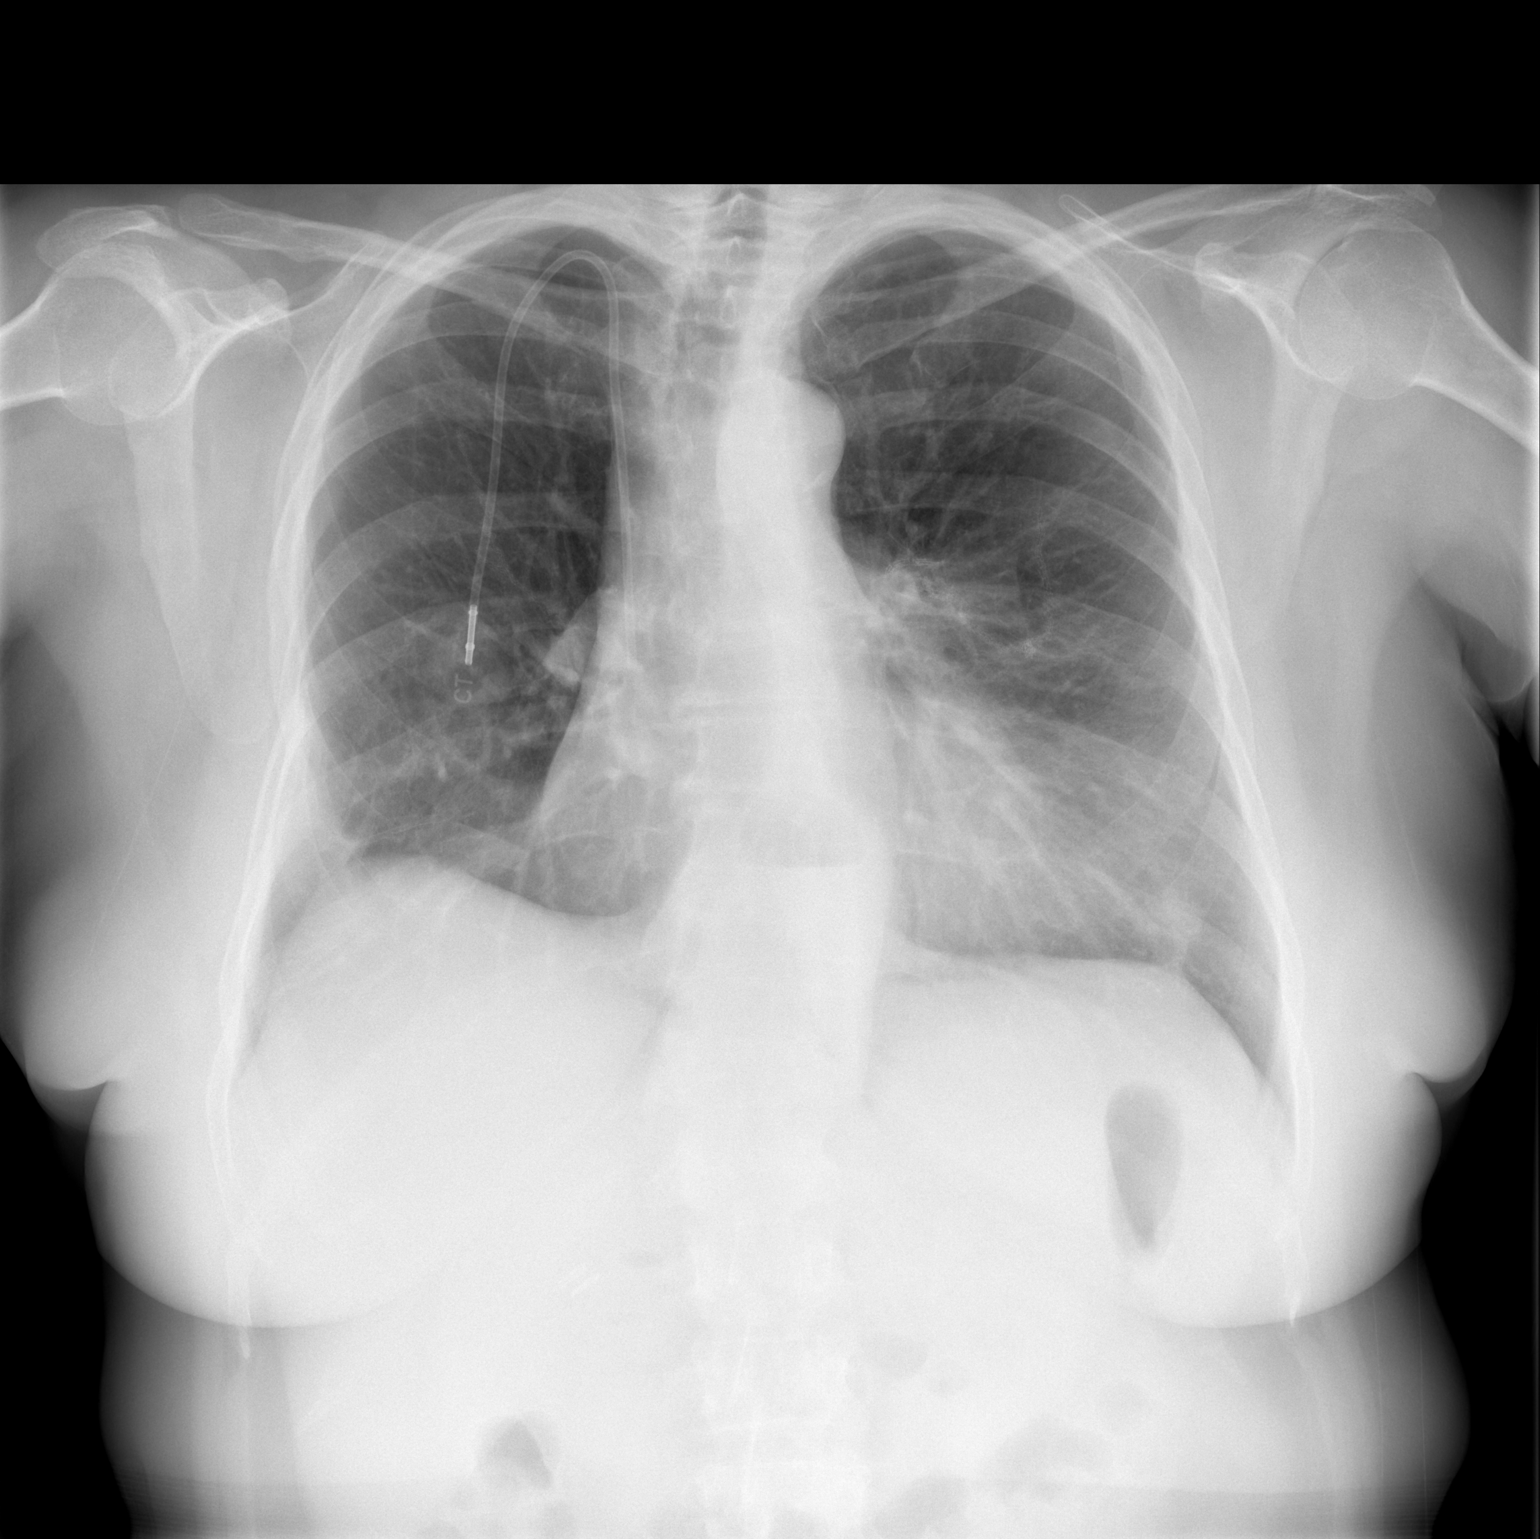

[w chest lat]
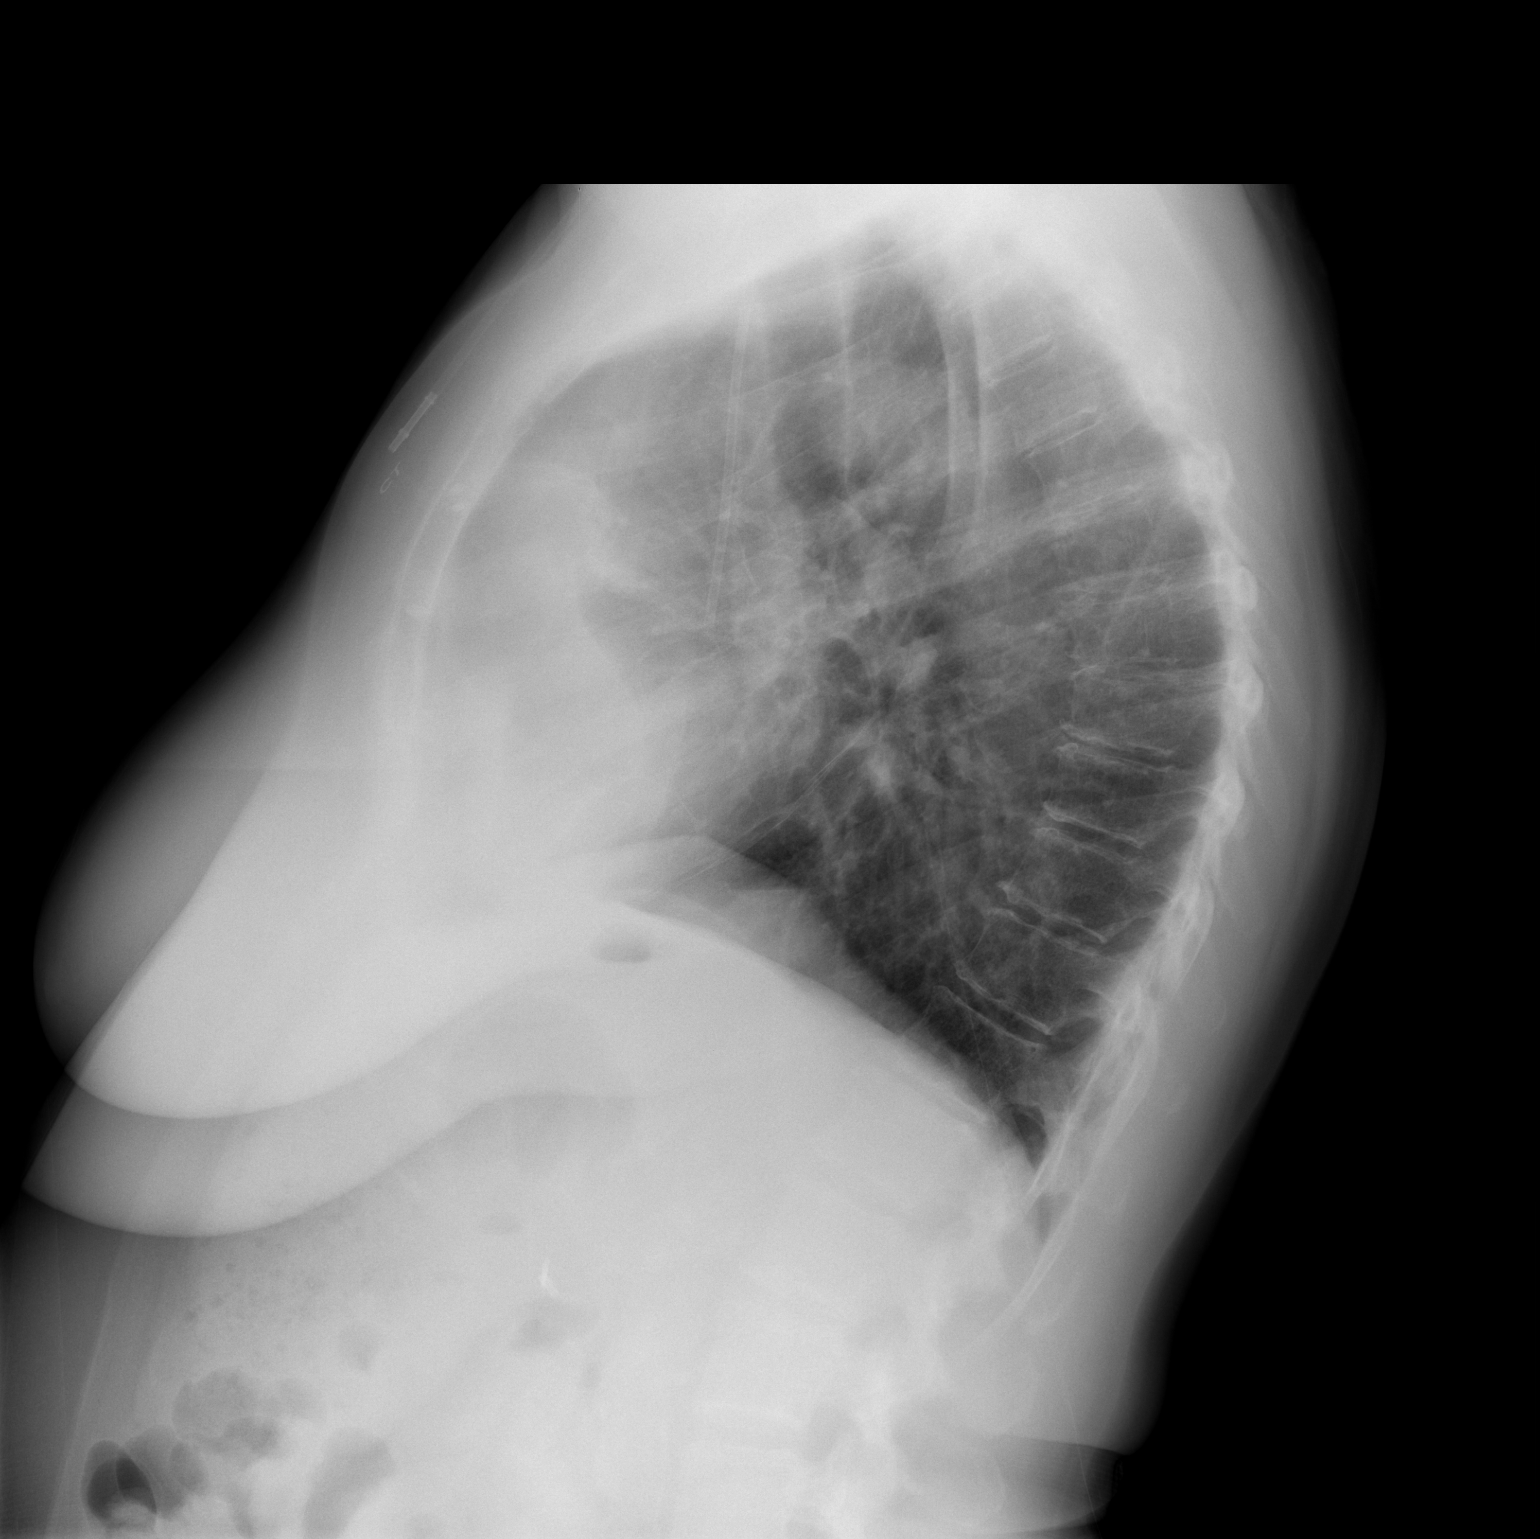

[2 of 2 positions shown; findings below may reference images not displayed]

FINDINGS: Trachea is midline.  Heart size stable.  Bibasilar pleuroparenchymal scarring.  Hiatal hernia.
IMPRESSION: Bibasilar pleuroparenchymal scarring.  No acute findings.

## 2009-02-16 ENCOUNTER — Ambulatory Visit: Payer: Self-pay | Admitting: Hematology & Oncology

## 2009-03-12 ENCOUNTER — Encounter: Admission: RE | Admit: 2009-03-12 | Discharge: 2009-03-12 | Payer: Self-pay | Admitting: Hematology & Oncology

## 2009-03-24 ENCOUNTER — Ambulatory Visit: Payer: Self-pay | Admitting: Hematology & Oncology

## 2009-03-25 LAB — COMPREHENSIVE METABOLIC PANEL
ALT: 16 U/L (ref 0–35)
AST: 23 U/L (ref 0–37)
CO2: 21 mEq/L (ref 19–32)
Calcium: 9.2 mg/dL (ref 8.4–10.5)
Chloride: 103 mEq/L (ref 96–112)
Creatinine, Ser: 0.62 mg/dL (ref 0.40–1.20)
Potassium: 4.4 mEq/L (ref 3.5–5.3)
Sodium: 138 mEq/L (ref 135–145)
Total Protein: 7.2 g/dL (ref 6.0–8.3)

## 2009-03-25 LAB — CBC WITH DIFFERENTIAL (CANCER CENTER ONLY)
BASO%: 0.6 % (ref 0.0–2.0)
EOS%: 2.7 % (ref 0.0–7.0)
HCT: 36.3 % (ref 34.8–46.6)
LYMPH#: 2.6 10*3/uL (ref 0.9–3.3)
MCHC: 33.3 g/dL (ref 32.0–36.0)
MONO#: 0.3 10*3/uL (ref 0.1–0.9)
NEUT#: 3.5 10*3/uL (ref 1.5–6.5)
Platelets: 229 10*3/uL (ref 145–400)
RDW: 14.1 % (ref 10.5–14.6)
WBC: 6.6 10*3/uL (ref 3.9–10.0)

## 2009-03-25 LAB — LIPID PANEL
LDL Cholesterol: 124 mg/dL — ABNORMAL HIGH (ref 0–99)
Triglycerides: 115 mg/dL (ref <150)
VLDL: 23 mg/dL (ref 0–40)

## 2009-03-25 LAB — VITAMIN D 25 HYDROXY (VIT D DEFICIENCY, FRACTURES): Vit D, 25-Hydroxy: 73 ng/mL (ref 30–89)

## 2009-03-29 IMAGING — CR DG CHEST 1V PORT
1 series · 1 of 1 positions shown · non-contrast
Comparison: 03/19/2007

CLINICAL DATA: Syncope/history of metastatic lung cancer

PORTABLE CHEST - 1 VIEW

[view not recorded]
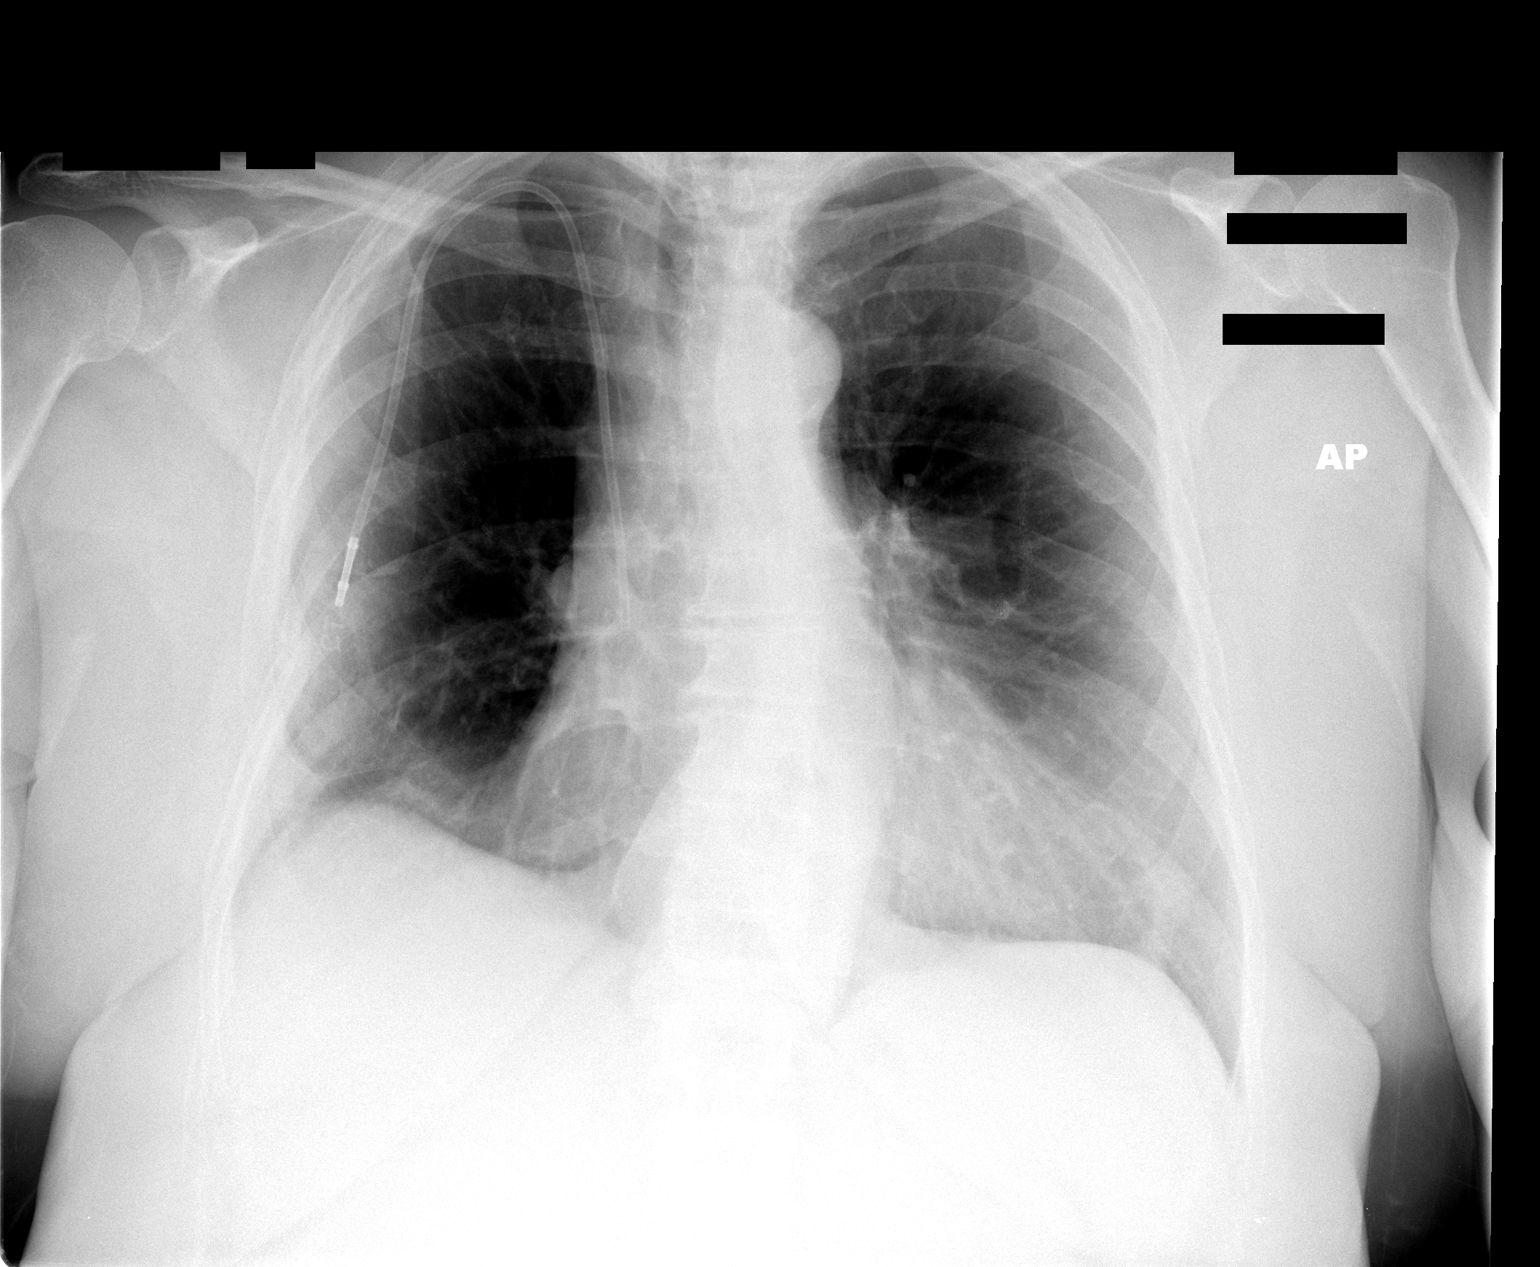

[1 of 1 positions shown; findings below may reference images not displayed]

FINDINGS: Heart size within normal limits considering AP
projection.  No active cardiopulmonary disease in one-view.  Port-A-
Cath unchanged.   osseous structures intact in one-view.
IMPRESSION: No active disease.

## 2009-04-19 IMAGING — CT CT CHEST W/ CM
6 of 7 series · 13 of 46 positions shown, 18 images · IV contrast (agent unspecified)
Comparison: Chest plain film 05/15/2007.

CLINICAL DATA: Restaging lung cancer.  Aneurysm repair 1661. Right-
sided lung cancer 5559 left side lung cancer in 7448.

CT HEAD WITHOUT AND WITH CONTRAST
TECHNIQUE: Contiguous axial images were obtained from the base of
the skull through the vertex without and with intravenous contrast
Contrast: 100 ml 2mnipaque-W99
TECHNIQUE: Contiguous axial images of the chest abdomen and pelvis
were obtained after IV contrast administration.
CT CHEST

[Series 4: chest/abd/pelvis · axial · 0.74mm/px · z∈[-631,-496]mm · 3 of 116 slices shown]
[im 9/116  soft-tissue]
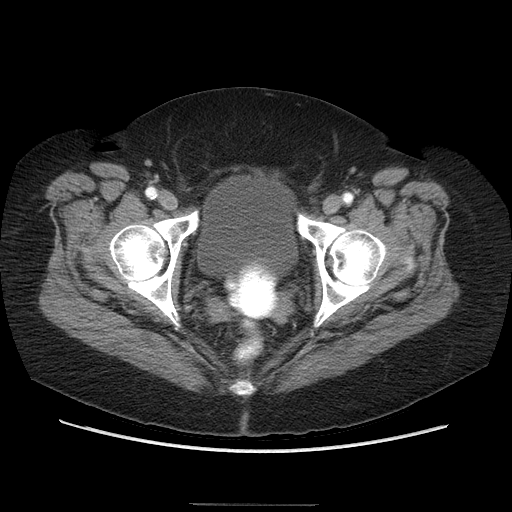
[im 27/116  soft-tissue]
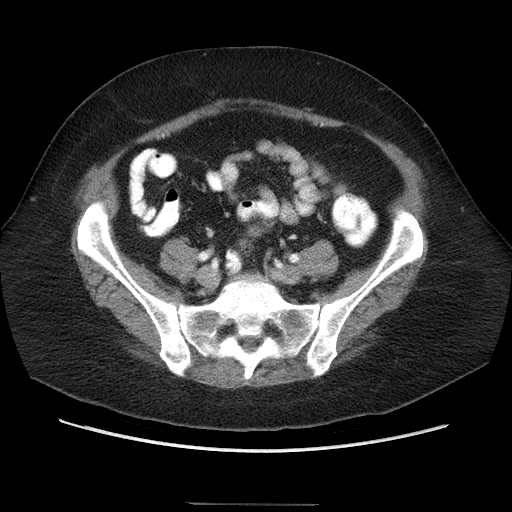
[im 36/116  soft-tissue]
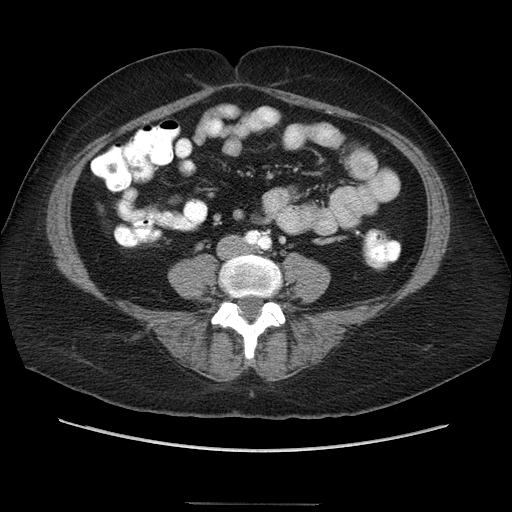

[Series 6: renal delay · axial · delayed · 0.70mm/px · z∈[-443,-398]mm · 2 of 28 slices shown]
[im 10/28  soft-tissue]
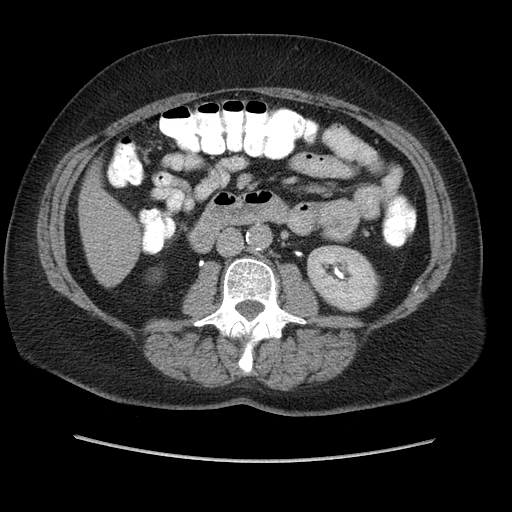
[im 19/28  soft-tissue]
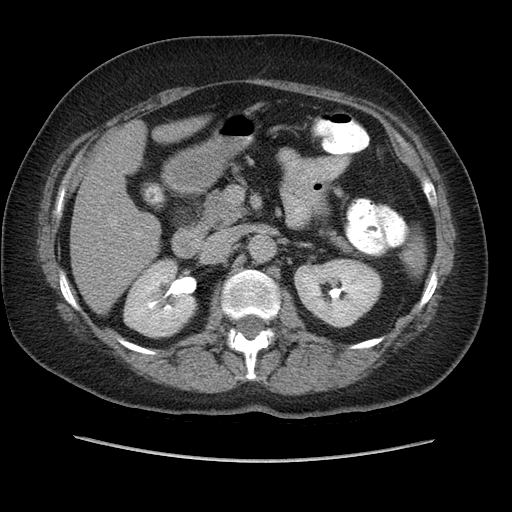

[Series 32: 3d filtered head w/(date) · axial · 0.49mm/px · z∈[+76,+124]mm · 2 of 28 slices shown, 5 images]
[im 10/28  soft-tissue]
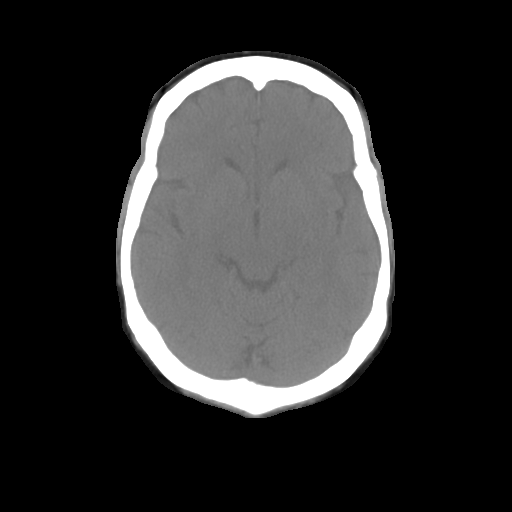
[im 10/28  lung]
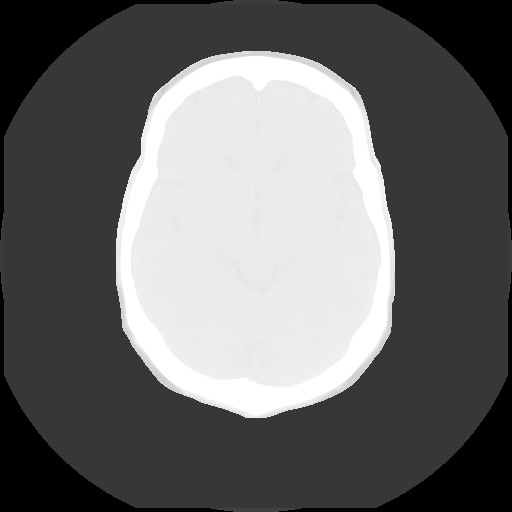
[im 10/28  bone]
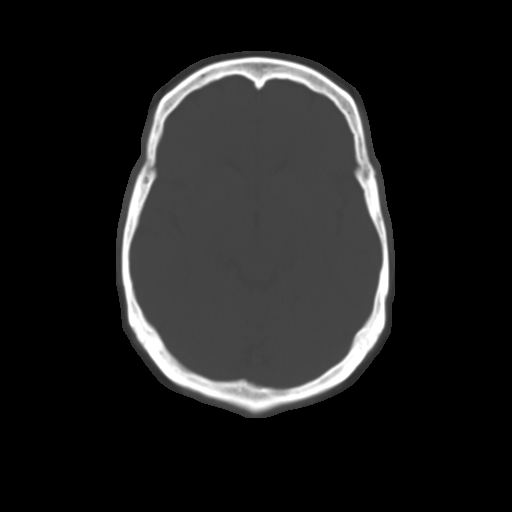
[im 19/28  soft-tissue]
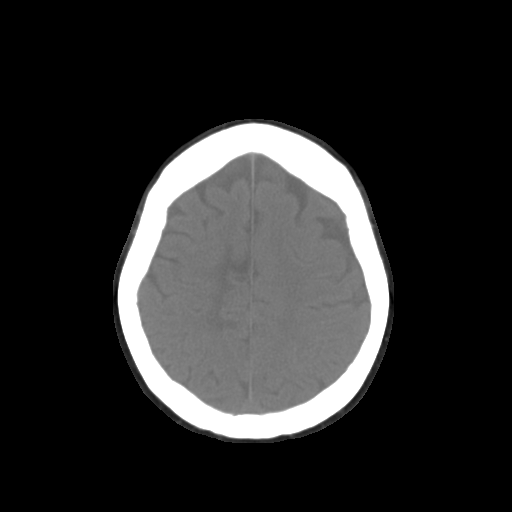
[im 19/28  lung]
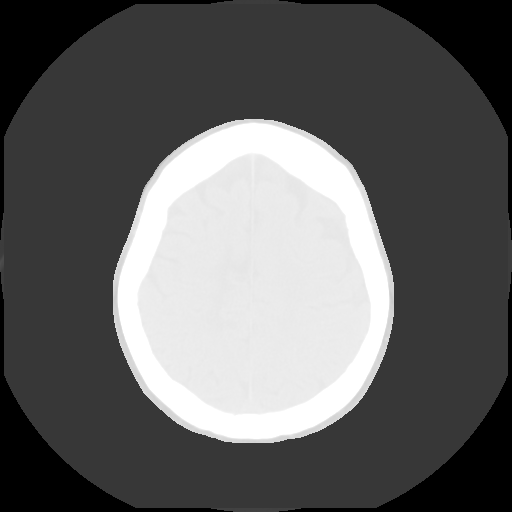

[Series 37: 3d filtered head w/cm · axial · 0.49mm/px · z∈[+76,+124]mm · 2 of 28 slices shown]
[im 10/28  soft-tissue]
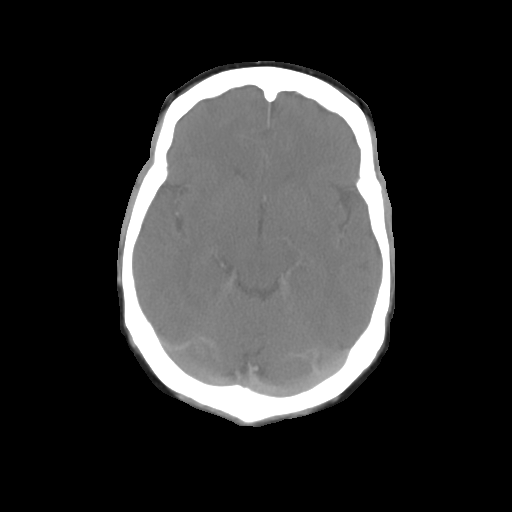
[im 19/28  soft-tissue]
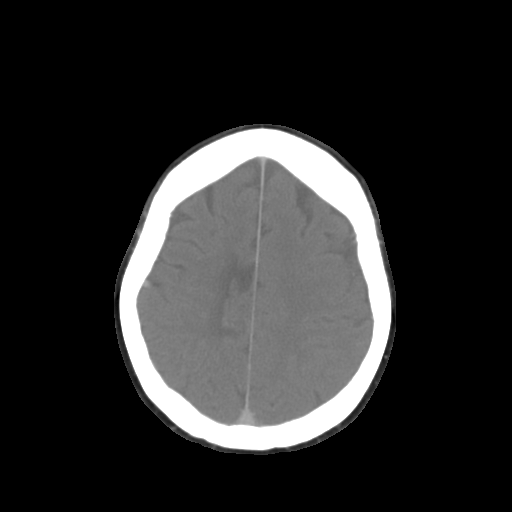

[Series 601: coronal body · coronal · 1.17mm/px · 1 of 144 slices shown, 2 images]
[im 48/144  soft-tissue]
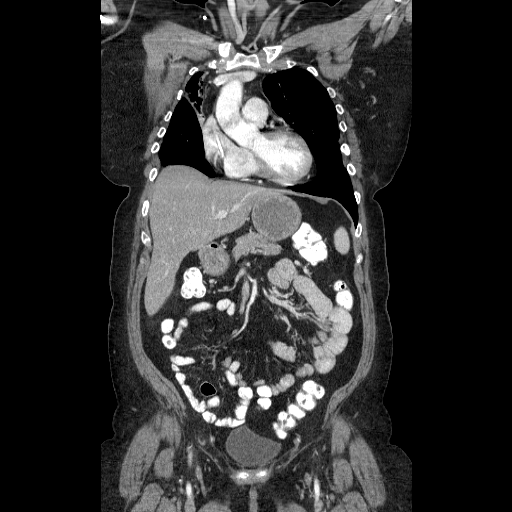
[im 48/144  bone]
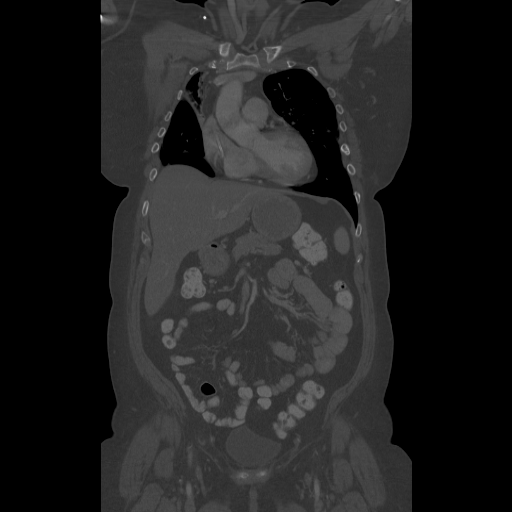

[Series 602: sagittal body · sagittal · 1.17mm/px · 3 of 153 slices shown, 4 images]
[im 51/153  soft-tissue]
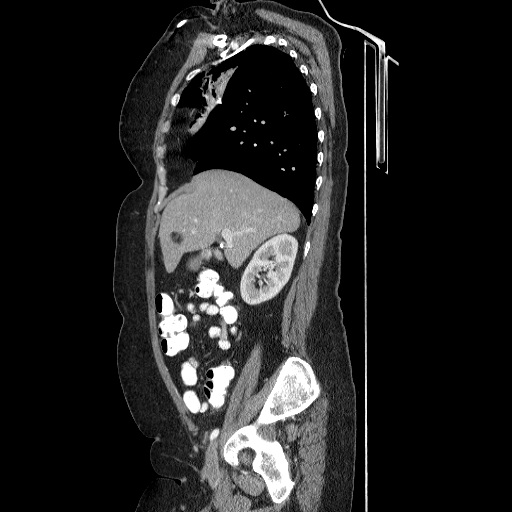
[im 68/153  soft-tissue]
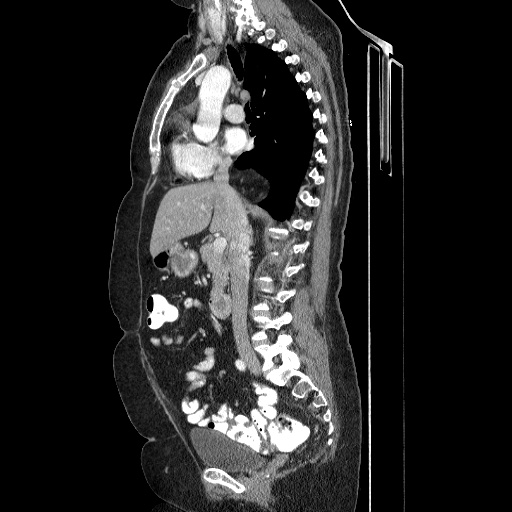
[im 68/153  bone]
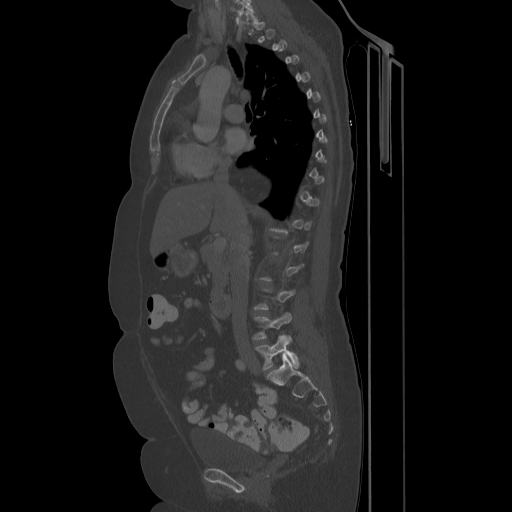
[im 85/153  soft-tissue]
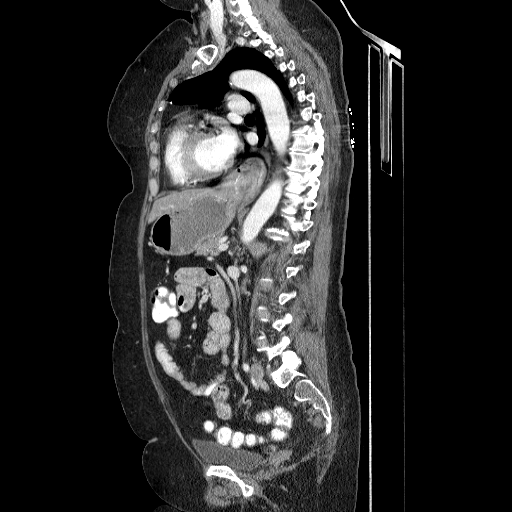

[13 of 46 positions shown; findings below may reference images not displayed]

Prior PET of 12/26/2006
and diagnostic chest CT of 12/14/2006.  Head CT most recent
11/03/2003.
FINDINGS: Bone windows demonstrate minimal mucosal thickening
ethmoid air cells.  Hypoplastic frontal sinuses.  Prior high right
craniotomy.

Soft tissue windows demonstrate mild cerebral atrophy.  Minimal
encephalomalacia in the white matter of the vertex is similar to
5559. No  mass lesion, hemorrhage, hydrocephalus, acute infarct,
intra-axial, or extra-axial fluid collection.

Postcontrast images demonstrate no abnormal enhancement to suggest
metastatic disease.
IMPRESSION: 1.  No acute process or evidence of intracranial metastasis.
2.  Craniotomy near the vertex with underlying encephalomalacia,
similar to prior exam.

CT CHEST, ABDOMEN AND PELVIS WITH CONTRAST
FINDINGS: Lung windows demonstrate volume loss and increased
opacity in the anteromedial right lung, most likely due to
treatment effects.

Scar in the right lower lobe adjacent to the incompletely healed
rib fractures.  .

Mild centrilobular emphysema.  New surgical changes in the left
upper lobe.

Soft tissue windows demonstrate right-sided Port-A-Cath terminating
at high right atrium.  Normal heart size without pericardial or
pleural effusion.  No mediastinal or hilar adenopathy.  Moderate
hiatal hernia.
IMPRESSION: 1.  No evidence of metastatic disease within the chest.
2.  Progressive volume loss and opacity within the anteromedial
right lung.  Correlate with interval radiation therapy.  Recommend
followup attention.
3.  Moderate hiatal hernia.

CT ABDOMEN
FINDINGS: Mild fatty infiltration the liver.  Normal spleen,
distal stomach, pancreas.  Cholecystectomy without biliary ductal
dilatation.  Normal adrenal glands and kidneys.  No retroperitoneal
retrocrural adenopathy.  Normal abdominal bowel loops without
ascites.
IMPRESSION: 1.  No acute process or evidence of active disease in the abdomen.
2.  Mild fatty infiltration of the liver.

CT PELVIS
FINDINGS: Normal pelvic bowel loops.  No pelvic adenopathy.
Hysterectomy.  Normal urinary bladder.  No adnexal mass.  Sclerosis
most consistent with healed fracture at the 6th and 7th
anterolateral left ribs.  Likely post-traumatic.
IMPRESSION: 1.  Hysterectomy but no acute pelvic process or evidence of
metastatic disease.

## 2009-05-11 ENCOUNTER — Ambulatory Visit: Payer: Self-pay | Admitting: Hematology & Oncology

## 2009-05-27 ENCOUNTER — Ambulatory Visit (HOSPITAL_COMMUNITY): Admission: RE | Admit: 2009-05-27 | Discharge: 2009-05-27 | Payer: Self-pay | Admitting: Hematology & Oncology

## 2009-06-29 ENCOUNTER — Encounter: Admission: RE | Admit: 2009-06-29 | Discharge: 2009-06-29 | Payer: Self-pay | Admitting: Hematology & Oncology

## 2009-07-07 ENCOUNTER — Ambulatory Visit: Payer: Self-pay | Admitting: Hematology & Oncology

## 2009-07-08 LAB — CBC WITH DIFFERENTIAL (CANCER CENTER ONLY)
BASO%: 0.7 % (ref 0.0–2.0)
Eosinophils Absolute: 0.2 10*3/uL (ref 0.0–0.5)
HCT: 37.4 % (ref 34.8–46.6)
LYMPH#: 2.7 10*3/uL (ref 0.9–3.3)
LYMPH%: 36.8 % (ref 14.0–48.0)
MCV: 83 fL (ref 81–101)
MONO#: 0.3 10*3/uL (ref 0.1–0.9)
NEUT%: 55.9 % (ref 39.6–80.0)
Platelets: 224 10*3/uL (ref 145–400)
RBC: 4.53 10*6/uL (ref 3.70–5.32)
WBC: 7.4 10*3/uL (ref 3.9–10.0)

## 2009-07-09 LAB — VITAMIN D 25 HYDROXY (VIT D DEFICIENCY, FRACTURES): Vit D, 25-Hydroxy: 68 ng/mL (ref 30–89)

## 2009-07-09 LAB — COMPREHENSIVE METABOLIC PANEL
ALT: 14 U/L (ref 0–35)
CO2: 25 mEq/L (ref 19–32)
Calcium: 9 mg/dL (ref 8.4–10.5)
Chloride: 105 mEq/L (ref 96–112)
Glucose, Bld: 75 mg/dL (ref 70–99)
Sodium: 141 mEq/L (ref 135–145)
Total Bilirubin: 0.3 mg/dL (ref 0.3–1.2)
Total Protein: 6.9 g/dL (ref 6.0–8.3)

## 2009-07-21 IMAGING — CT CT CHEST W/ CM
2 of 4 series · 15 of 36 positions shown, 18 images · IV contrast (75CC OMNI 300)
Comparison: 06/05/2007 CT

CLINICAL DATA: Follow-up lung CA

CT CHEST WITH CONTRAST
TECHNIQUE: Multidetector CT imaging of the chest was performed
following the standard protocol during bolus administration of
intravenous contrast.
Contrast: 75 ml 1mnipaque-UNN IV

[Series 3: routine chest · axial · 0.70mm/px · z∈[-229,+6]mm · 12 of 55 slices shown, 15 images]
[im 4/55  mediastinal]
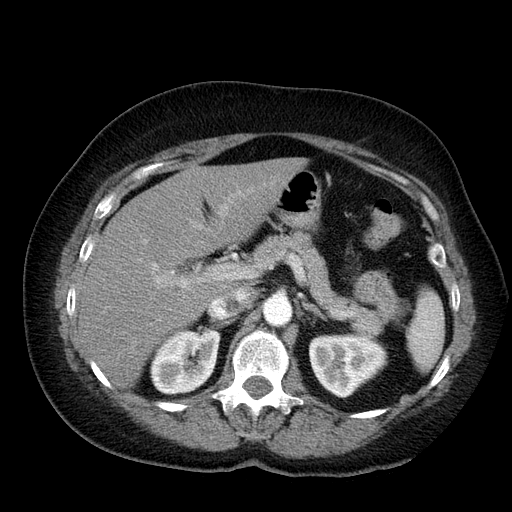
[im 4/55  lung]
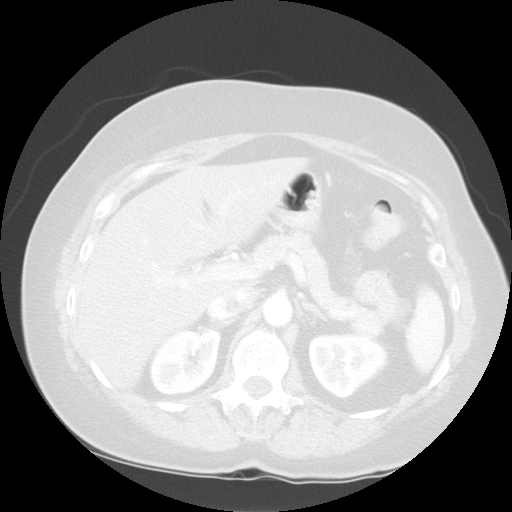
[im 8/55  lung]
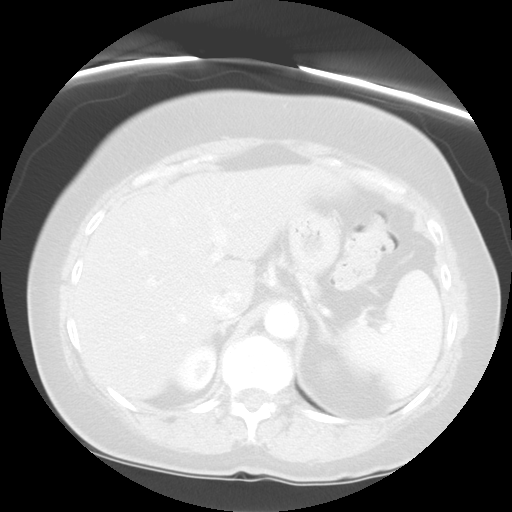
[im 12/55  lung]
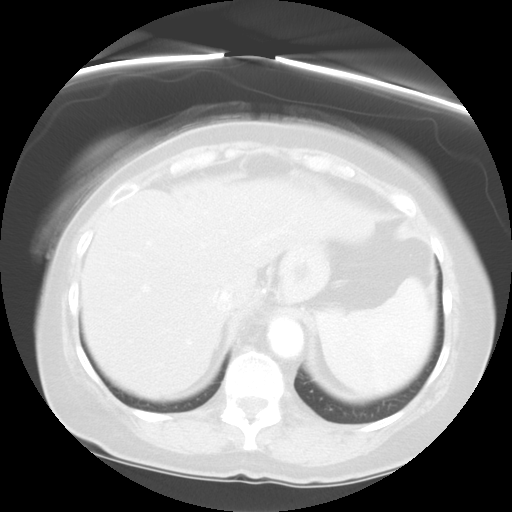
[im 16/55  lung]
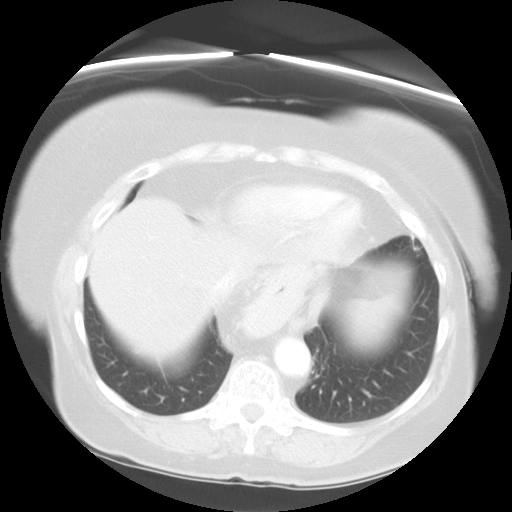
[im 20/55  mediastinal]
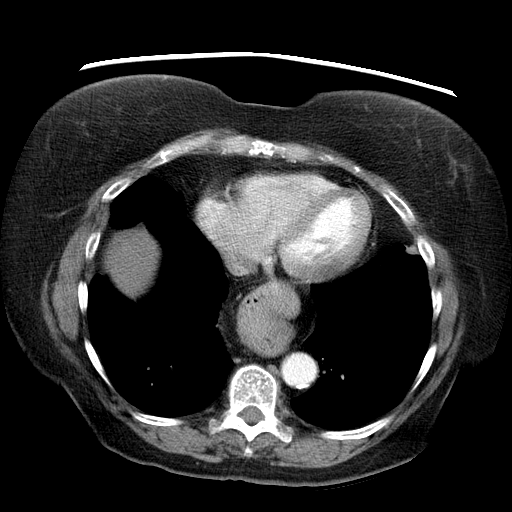
[im 20/55  lung]
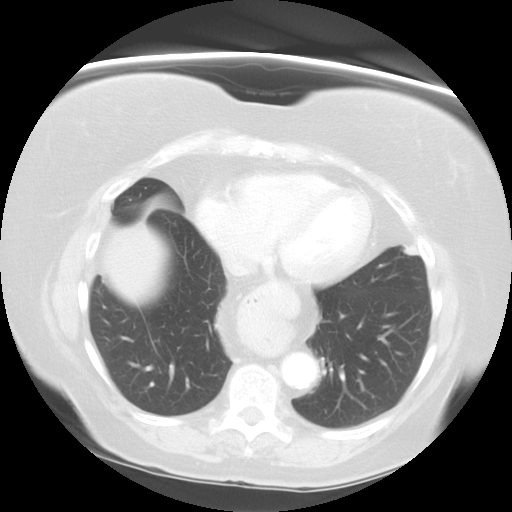
[im 24/55  lung]
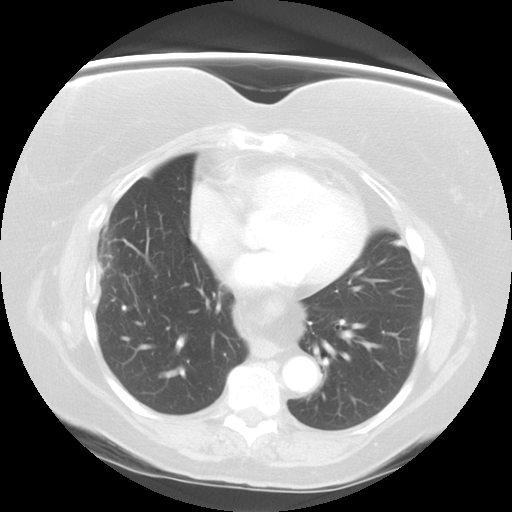
[im 31/55  lung]
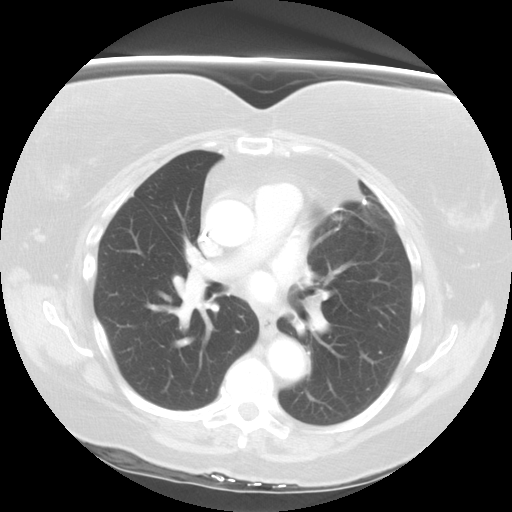
[im 35/55  lung]
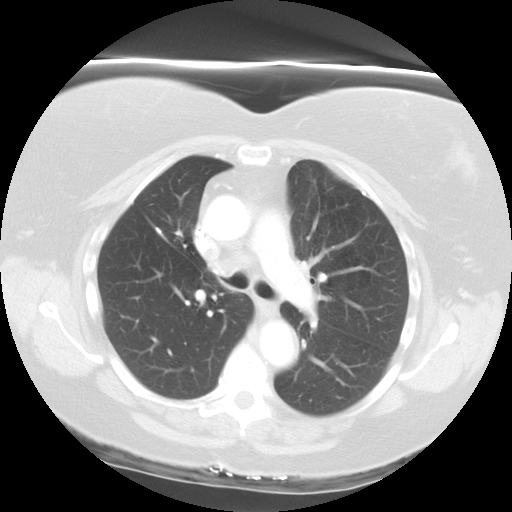
[im 39/55  mediastinal]
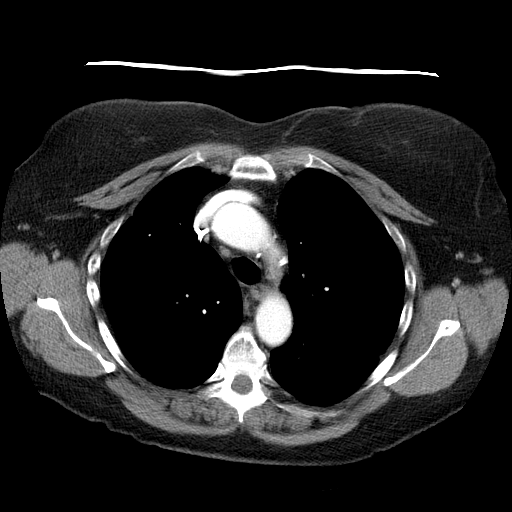
[im 39/55  lung]
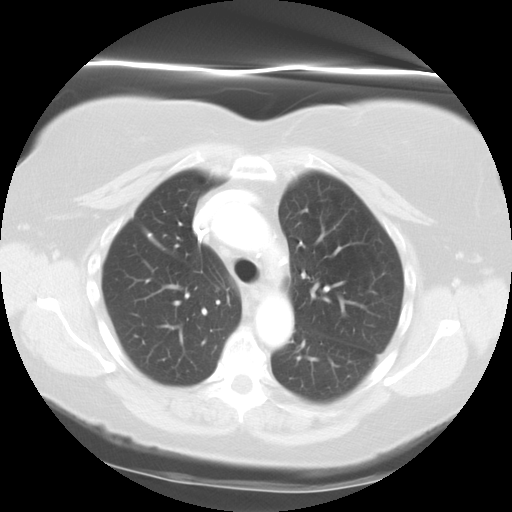
[im 43/55  lung]
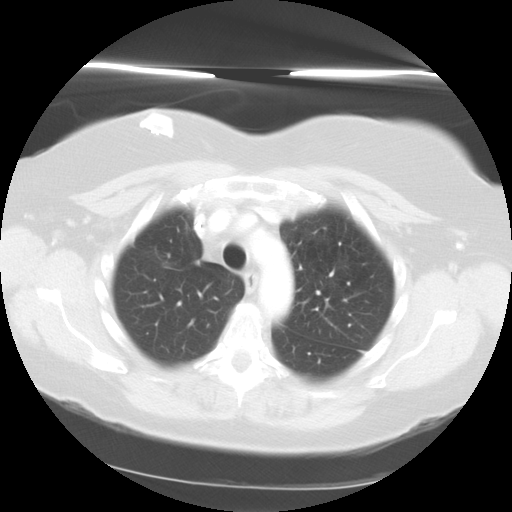
[im 47/55  lung]
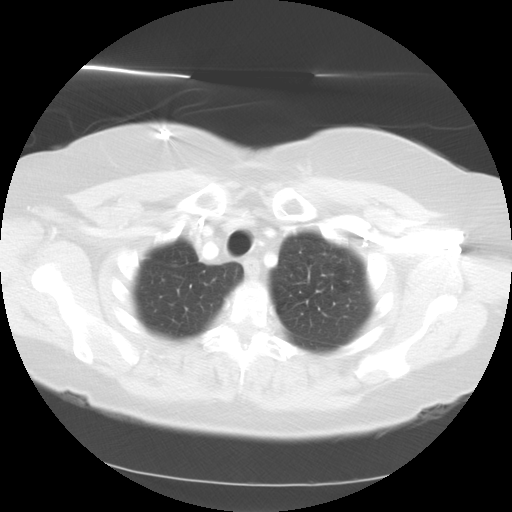
[im 51/55  lung]
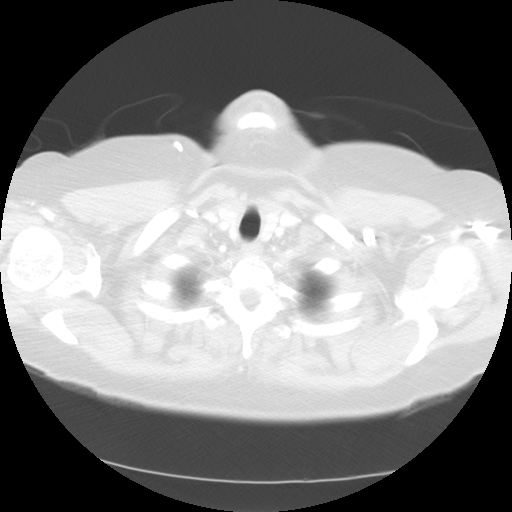

[Series 602: sagittal body · sagittal · 0.70mm/px · 3 of 145 slices shown]
[im 29/145  lung]
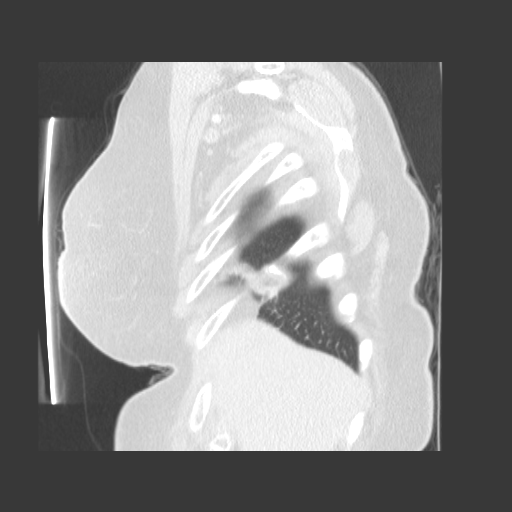
[im 58/145  lung]
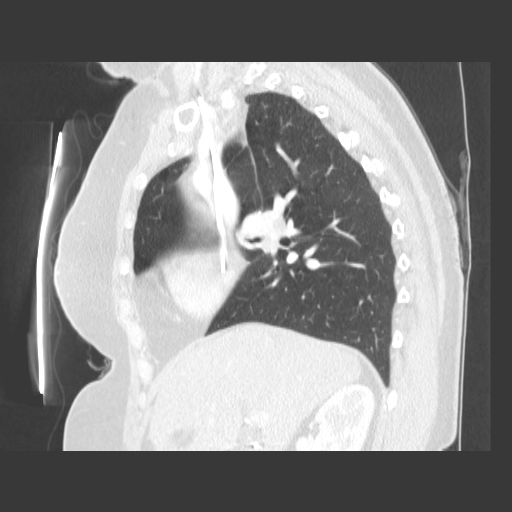
[im 87/145  lung]
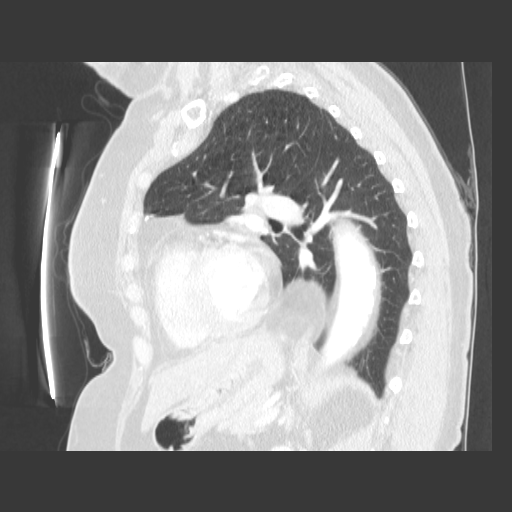

[15 of 36 positions shown; findings below may reference images not displayed]

FINDINGS: No pathologically enlarged mediastinal or hilar lymph
nodes.  Central venous catheter is in the is near the SVC - right
atrial junction.  Moderately large hiatal hernia.  No effusions.
Resolved previously noted right upper lobe atelectatic changes.
Mild bilateral pulmonary parenchymal scarring.  Nodular like
scarring lingular segment of the left lung (images 30 - 38) is
stable.  Negative for metastatic bone disease.
2.
IMPRESSION: No evidence for metastatic involvement of the chest.  Negative for
recurrent/residual tumor.

## 2009-08-24 ENCOUNTER — Ambulatory Visit: Payer: Self-pay | Admitting: Hematology & Oncology

## 2009-09-23 IMAGING — CT CT CHEST W/ CM
2 of 5 series · 15 of 36 positions shown, 18 images · IV contrast (75CC OMNI 300)
Comparison: 09/06/2007
COMPARISON: 06/05/2007

CLINICAL DATA: Restaging lung cancer.

CT CHEST WITH CONTRAST,CT HEAD WITHOUT AND WITH CONTRAST
TECHNIQUE: Multidetector CT imaging of the chest was performed
following the standard protocol during bolus administration of
intravenous contrast.,Technique:  Contiguous axial images were
obtained from the base of the skull through the vertex without and
with intravenous contrast.
Contrast: 75 ml of omni 300
TECHNIQUE: Contiguous axial images were obtained from the base of
the skull through the vertex without and with intravenous contrast

[Series 5: routine chest · axial · 0.70mm/px · z∈[-267,-47]mm · 12 of 54 slices shown, 15 images]
[im 5/54  mediastinal]
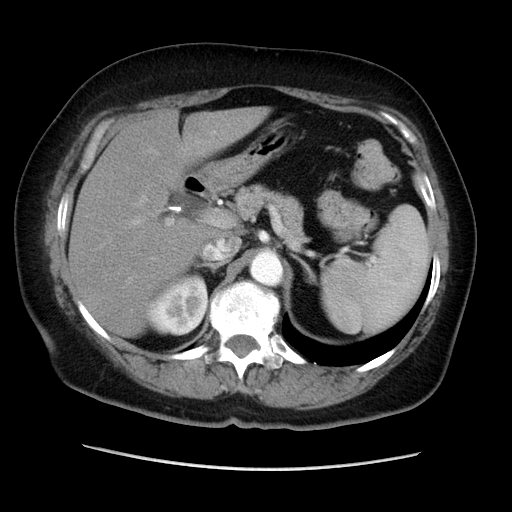
[im 5/54  lung]
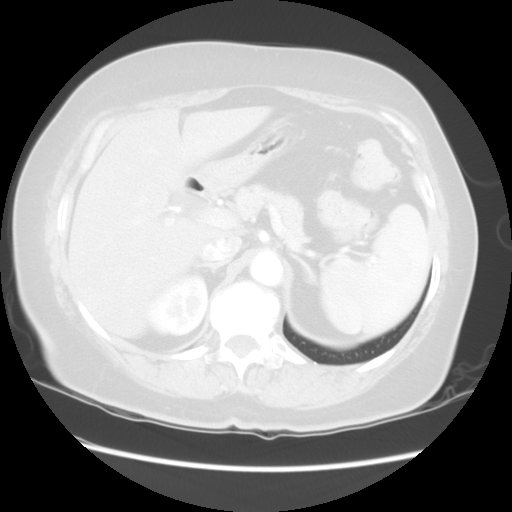
[im 9/54  lung]
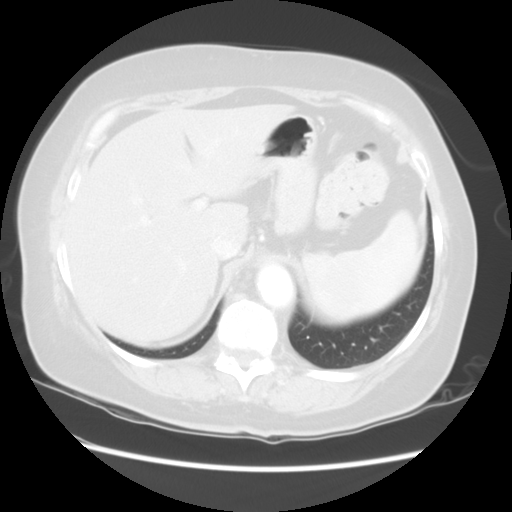
[im 13/54  lung]
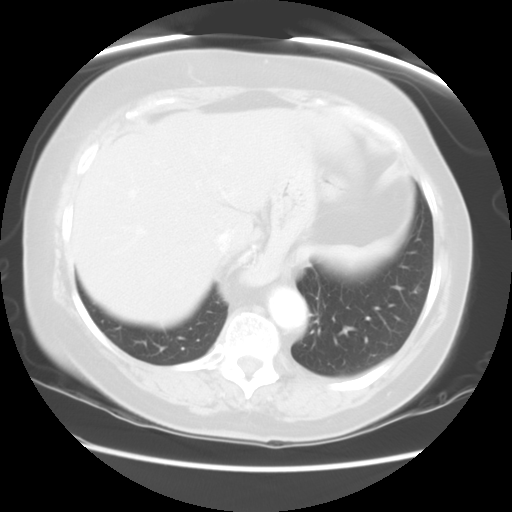
[im 17/54  lung]
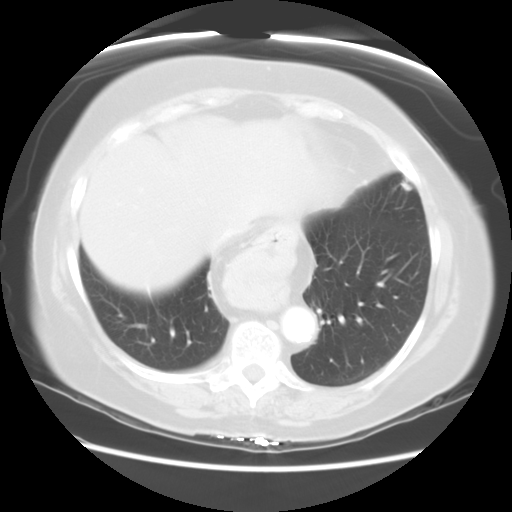
[im 21/54  mediastinal]
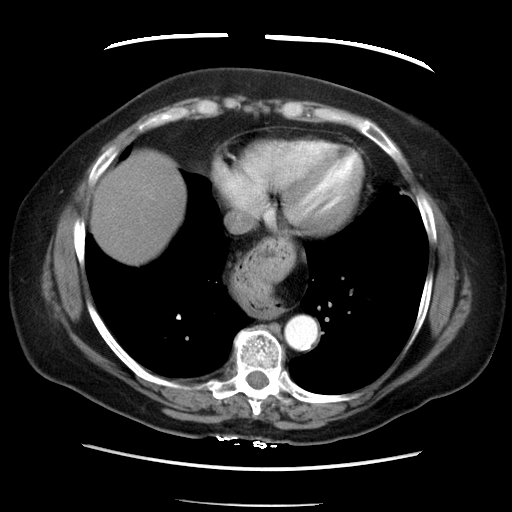
[im 21/54  lung]
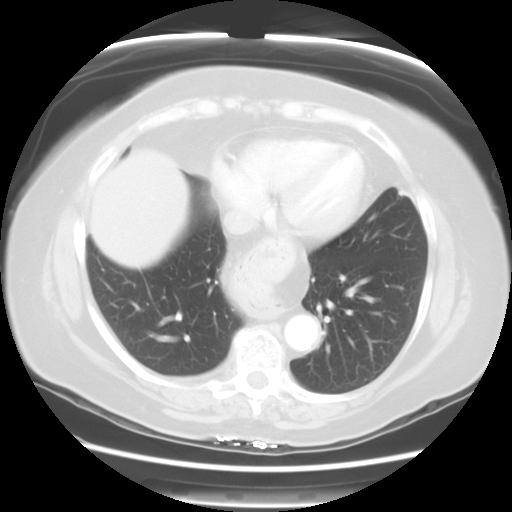
[im 25/54  lung]
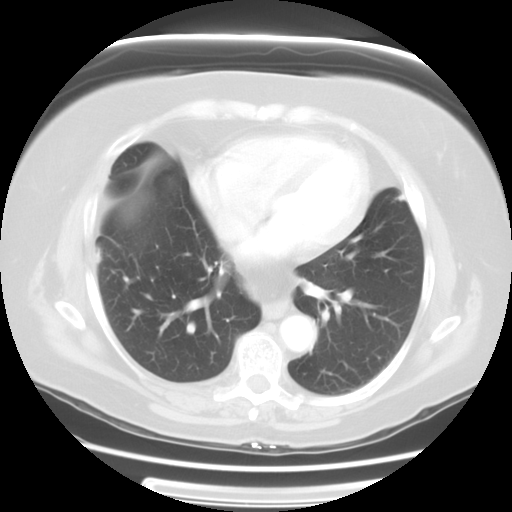
[im 29/54  lung]
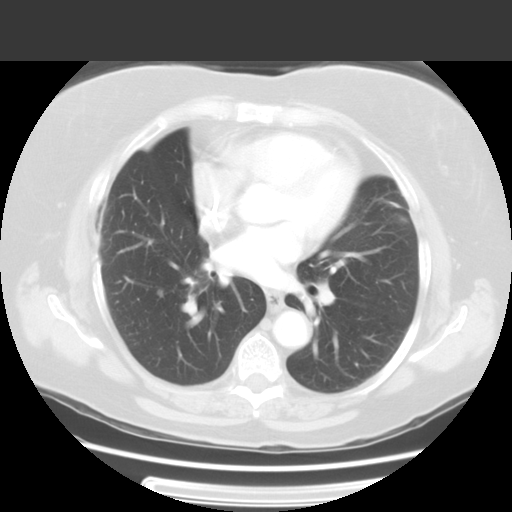
[im 33/54  lung]
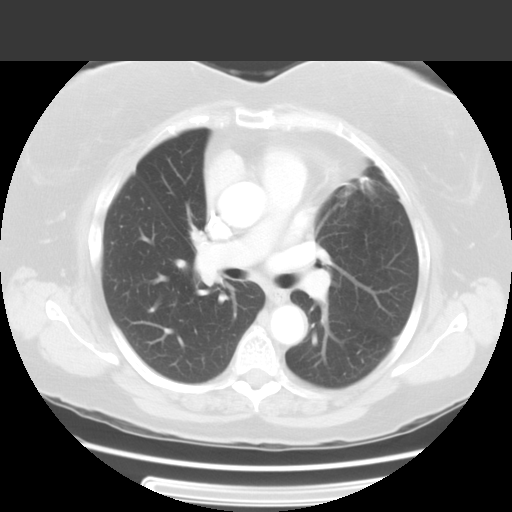
[im 37/54  mediastinal]
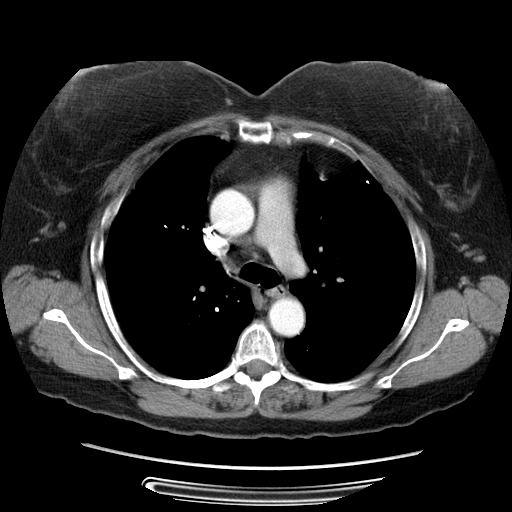
[im 37/54  lung]
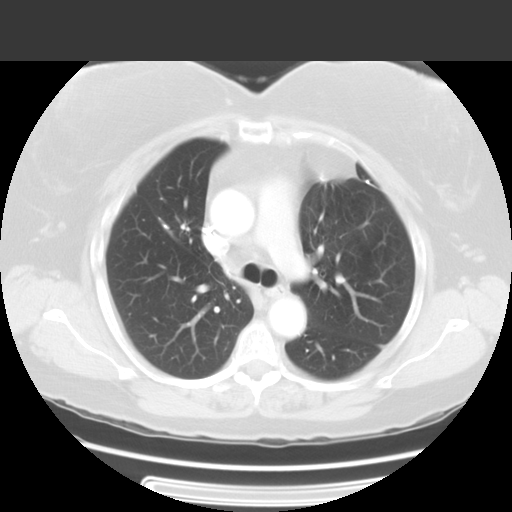
[im 41/54  lung]
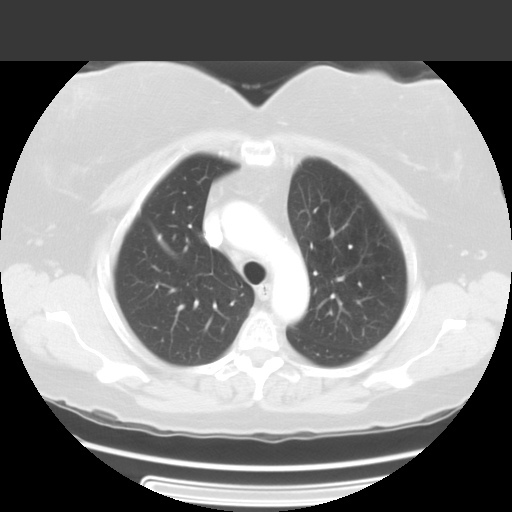
[im 45/54  lung]
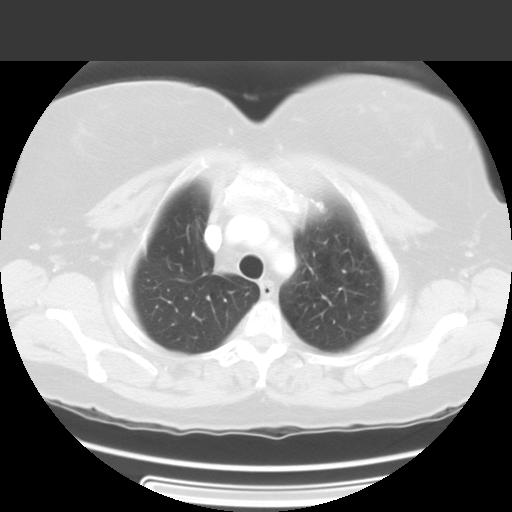
[im 49/54  lung]
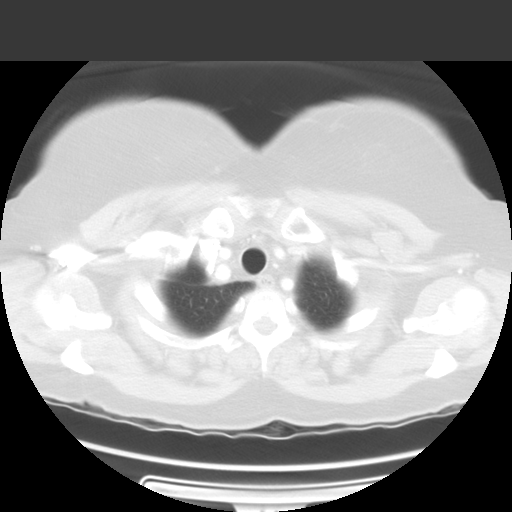

[Series 602: sagittal body · sagittal · 0.70mm/px · 3 of 145 slices shown]
[im 29/145  lung]
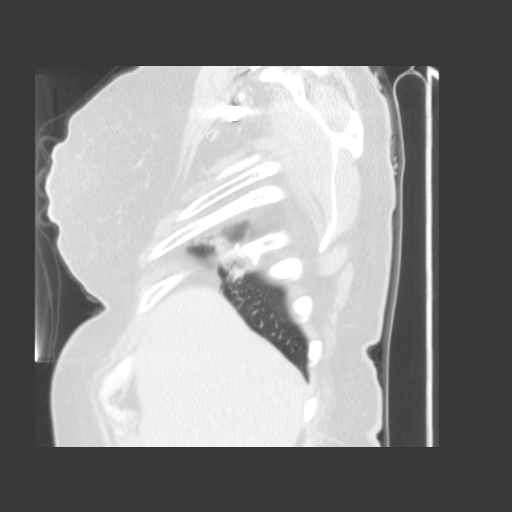
[im 58/145  lung]
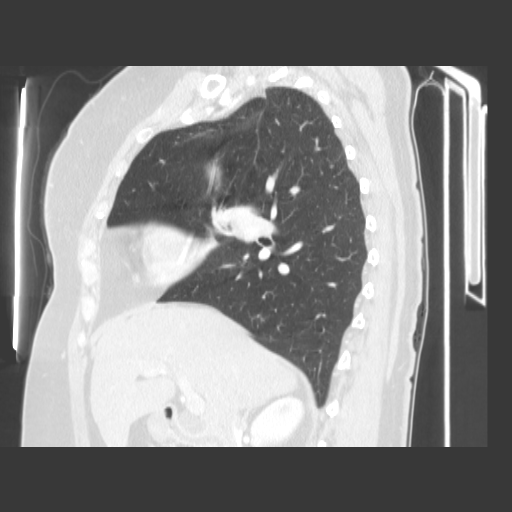
[im 87/145  lung]
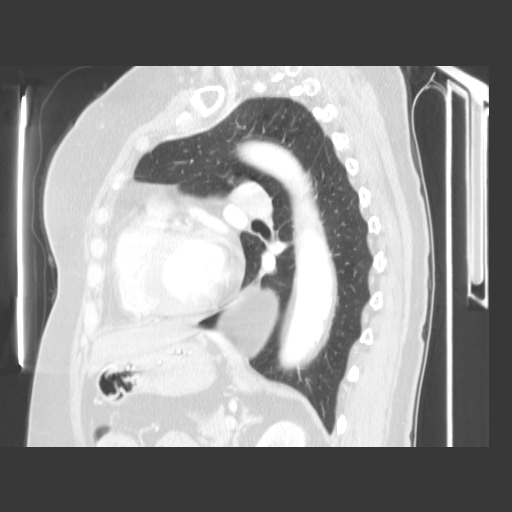

[15 of 36 positions shown; findings below may reference images not displayed]

FINDINGS: There is a large hiatal hernia.

No enlarged axillary or supraclavicular lymph nodes.

There are no enlarged mediastinal or hilar lymph nodes identified.

No pericardial or pleural fluid is present.

The lungs are emphysematous.

Postoperative change is identified within the right upper lobe.
There are also postoperative changes within the left upper lobe.
No suspicious pulmonary nodule or mass is identified within the
right lung.

There are no specific features to suggest residual or recurrent
tumor within the left lung.

The bones are diffusely osteopenic.

Postoperative change within the right ribs.  Noted.

There are no specific features to suggest bone metastases.

Mild multilevel thoracic spondylosis is identified.

The patient is status post cholecystectomy.

The common bile duct is increased and caliber measuring 11 mm,
image 51. The visualized portions of the adrenal glands appear
normal

A large hiatal hernia is again noted.
IMPRESSION: 1.  No specific evidence to suggest residual or recurrent tumor.

CT HEAD WITHOUT AND WITH CONTRAST
FINDINGS: Prior craniotomy defect is identified along the vertex
of the skull.

There is underlying encephalomalacia within the right frontal lobe.

No edema or mass effect is noted.

On the postcontrast series there is no abnormal area of enhancement
to suggest brain metastases.

The midline is maintained.

The ventricular volumes are normal.

There is no evidence for acute infarct, hemorrhage or mass.

The paranasal sinuses and mastoid air cells are normally aerated.

No suspicious bone lesions are identified.
IMPRESSION: 1.  Stable postoperative changes along the vertex of the skull and
within the right frontal lobe.
2.  No specific evidence for brain metastases.

## 2009-09-28 IMAGING — CT CT NECK W/ CM
3 of 5 series · 16 of 33 positions shown, 19 images · IV contrast ([ID] OMNI 300)
Comparison: 03/15/2005

CLINICAL DATA: Metastatic lung cancer.  Neck pain.

CT NECK WITH CONTRAST
TECHNIQUE: Multidetector CT imaging of the neck was performed with
intravenous contrast.
Contrast: 100 ml Jmnipaque-1CC

[Series 4: bone windows · axial · 0.37mm/px · z∈[+50,+216]mm · 8 of 172 slices shown, 10 images]
[im 20/172  soft-tissue]
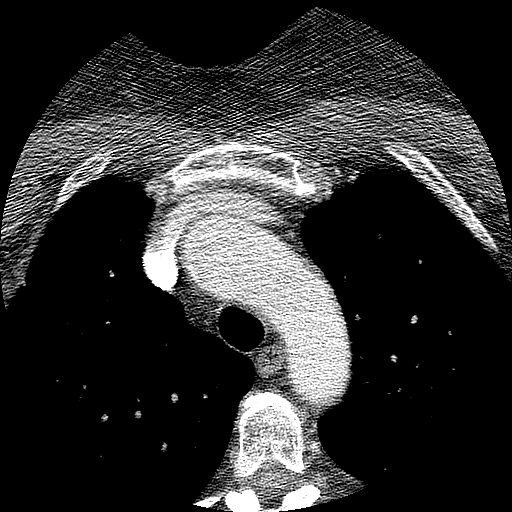
[im 20/172  bone]
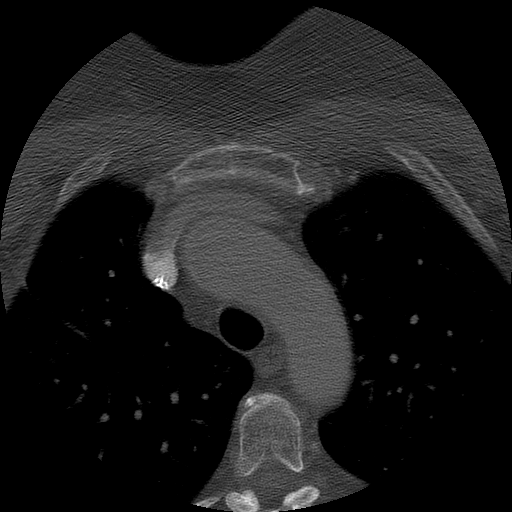
[im 39/172  bone]
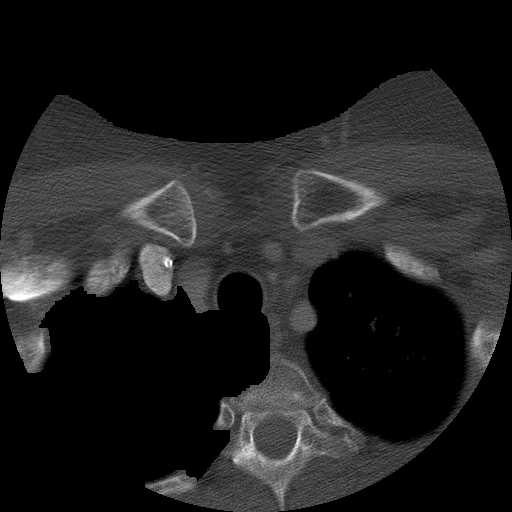
[im 58/172  bone]
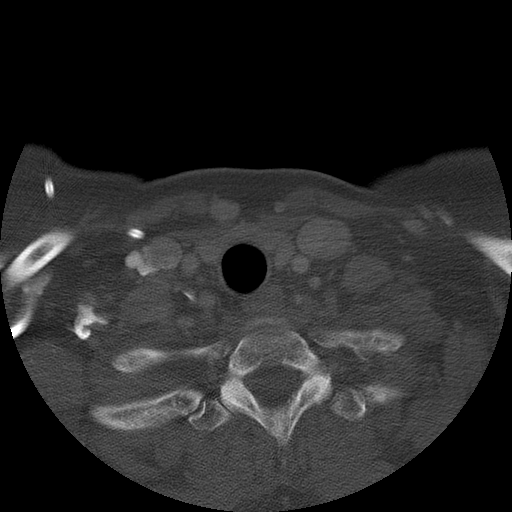
[im 77/172  bone]
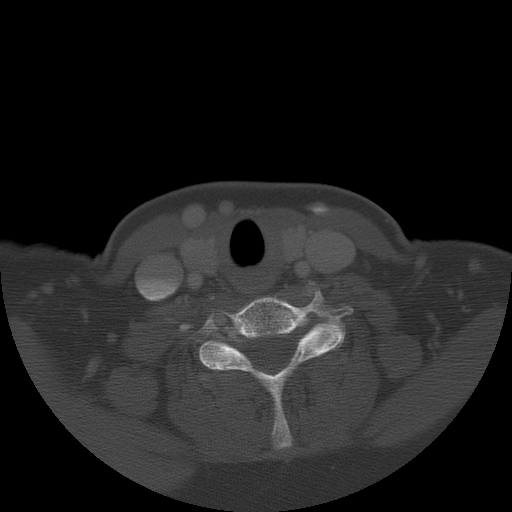
[im 96/172  soft-tissue]
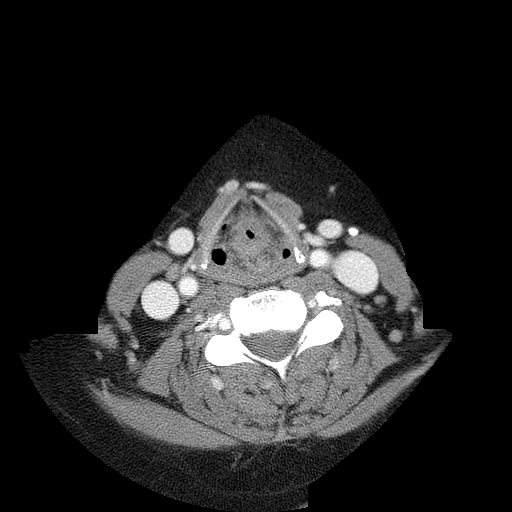
[im 96/172  bone]
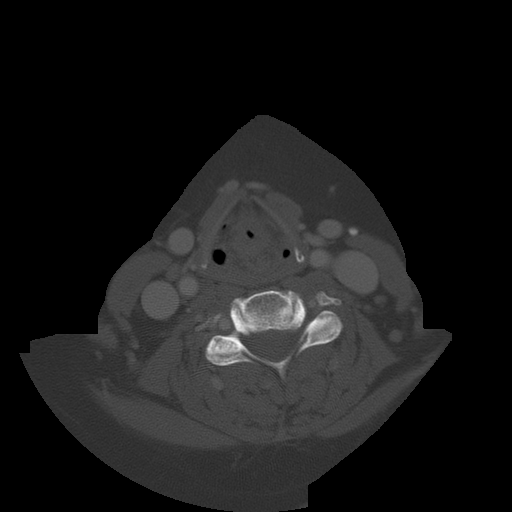
[im 115/172  bone]
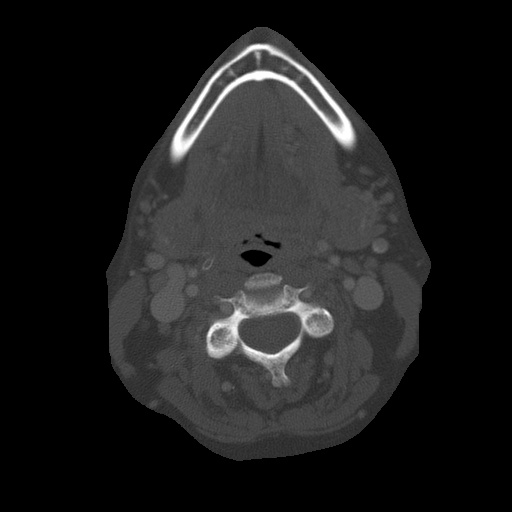
[im 134/172  bone]
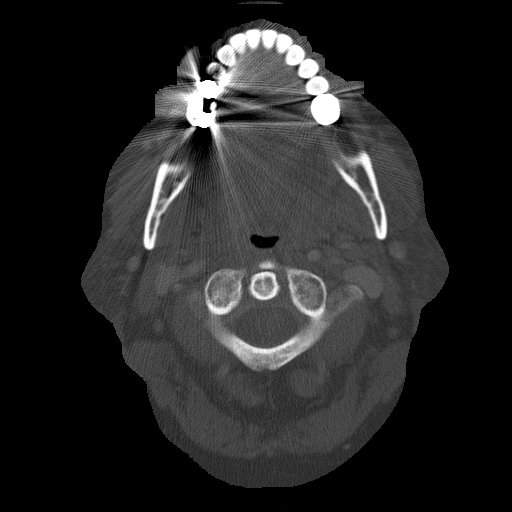
[im 153/172  bone]
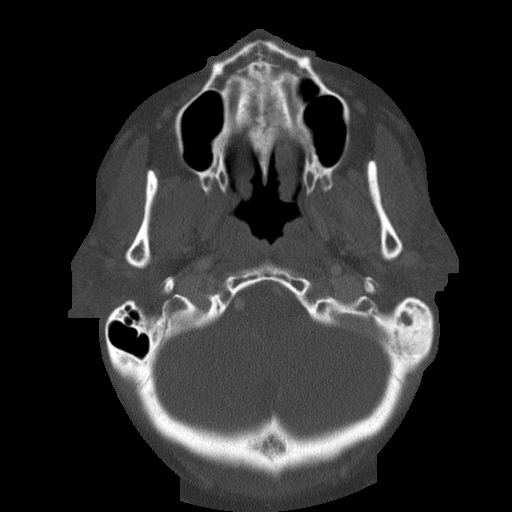

[Series 200: cor · coronal · 0.43mm/px · 3 of 70 slices shown]
[im 14/70  bone]
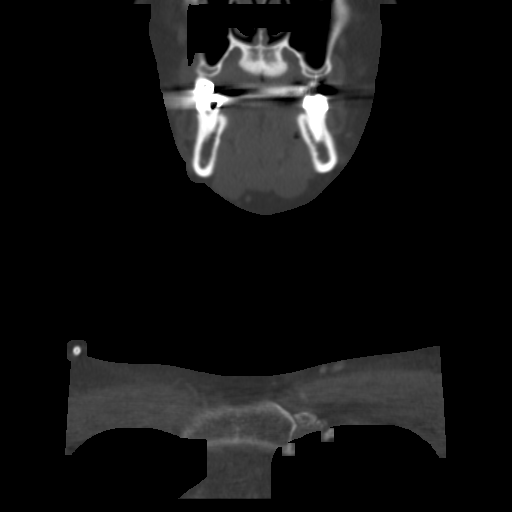
[im 28/70  bone]
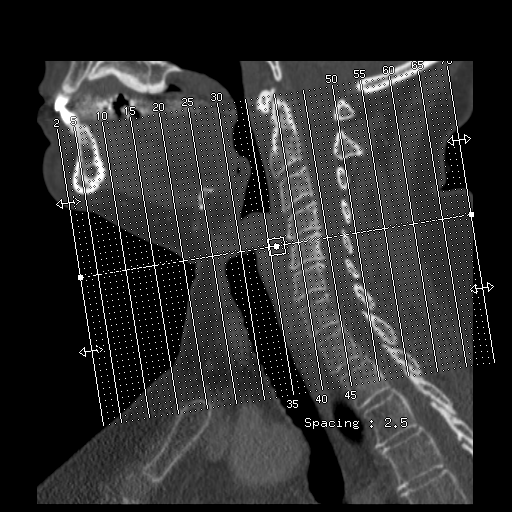
[im 42/70  bone]
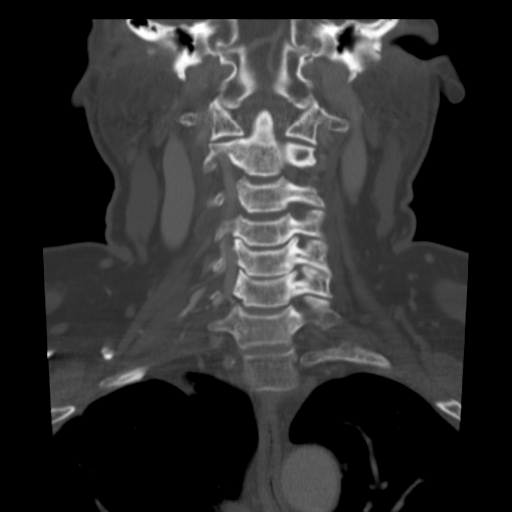

[Series 201: sag · sagittal · 0.43mm/px · 5 of 62 slices shown, 6 images]
[im 21/62  bone]
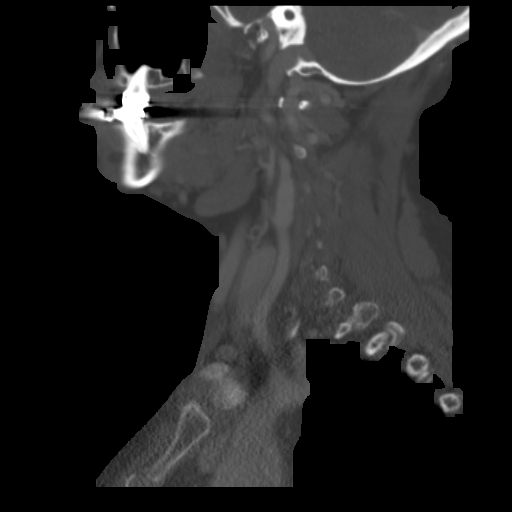
[im 26/62  bone]
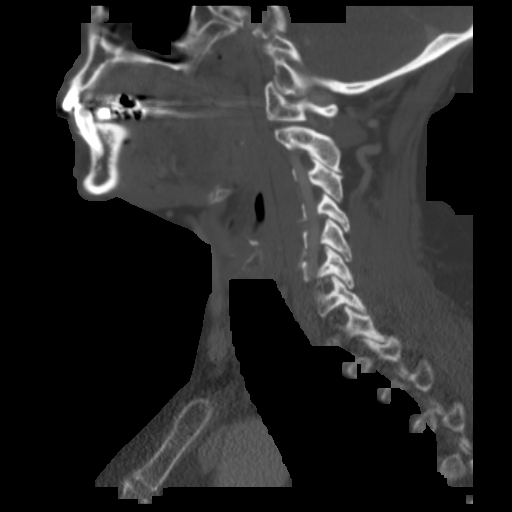
[im 31/62  soft-tissue]
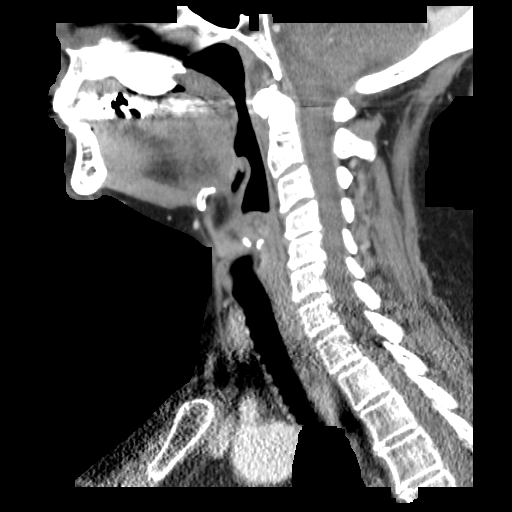
[im 31/62  bone]
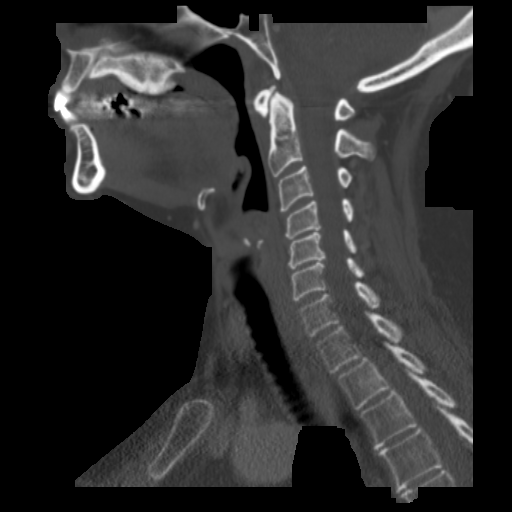
[im 36/62  bone]
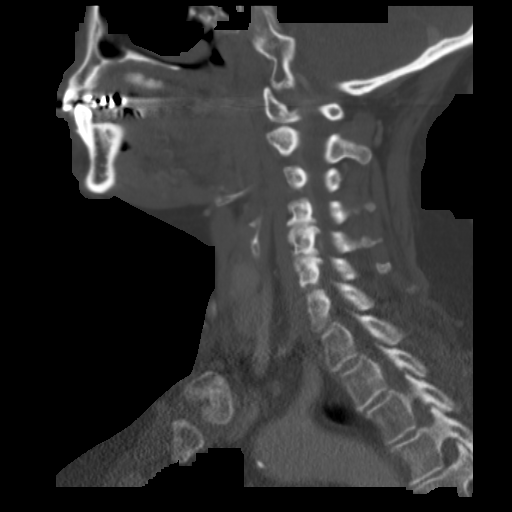
[im 41/62  bone]
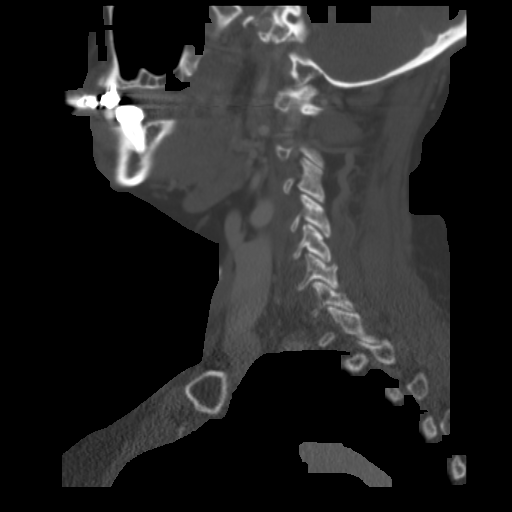

[16 of 33 positions shown; findings below may reference images not displayed]

FINDINGS: Visualized intracranial contents are normal.  The parotid
glands, submandibular glands and thyroid gland appear normal.
Vascular structures in the neck appear normal.  There is no
adenopathy.  Lung apices are clear.  No osseous metastatic disease
is evident.  The patient does have some ordinary mid cervical
spondylosis and lower cervical facet arthropathy.
IMPRESSION: No evidence of tumor or metastatic disease.  See above discussion.

## 2009-10-14 ENCOUNTER — Encounter: Admission: RE | Admit: 2009-10-14 | Discharge: 2009-10-14 | Payer: Self-pay | Admitting: Hematology & Oncology

## 2009-10-20 ENCOUNTER — Ambulatory Visit: Payer: Self-pay | Admitting: Hematology & Oncology

## 2009-11-05 ENCOUNTER — Ambulatory Visit (HOSPITAL_COMMUNITY): Admission: RE | Admit: 2009-11-05 | Discharge: 2009-11-05 | Payer: Self-pay | Admitting: Hematology & Oncology

## 2009-11-12 ENCOUNTER — Ambulatory Visit: Payer: Self-pay | Admitting: Cardiothoracic Surgery

## 2009-11-17 ENCOUNTER — Ambulatory Visit: Payer: Self-pay | Admitting: Cardiothoracic Surgery

## 2009-11-17 ENCOUNTER — Ambulatory Visit (HOSPITAL_COMMUNITY): Admission: RE | Admit: 2009-11-17 | Discharge: 2009-11-17 | Payer: Self-pay | Admitting: Cardiothoracic Surgery

## 2009-11-26 ENCOUNTER — Ambulatory Visit: Payer: Self-pay | Admitting: Hematology & Oncology

## 2009-12-27 IMAGING — CT CT CHEST W/ CM
2 of 3 series · 15 of 36 positions shown, 18 images · IV contrast (APPLIED)
Comparison: 11/09/2007

CLINICAL DATA: Recurrent lung cancer.  Right rib pain.  Cancer
diagnosed 3334.  Right upper lobe resection 3334.  Left upper lobe
surgery in 5775.  Chemotherapy complete.

CT CHEST WITH CONTRAST
TECHNIQUE: Multidetector CT imaging of the chest was performed
following the standard protocol during bolus administration of
intravenous contrast.
Contrast: 100 ml 8mnipaque-988

[Series 2: chest 5.0 b31f · axial · 0.63mm/px · z∈[-297,-52]mm · 12 of 59 slices shown, 15 images]
[im 5/59  mediastinal]
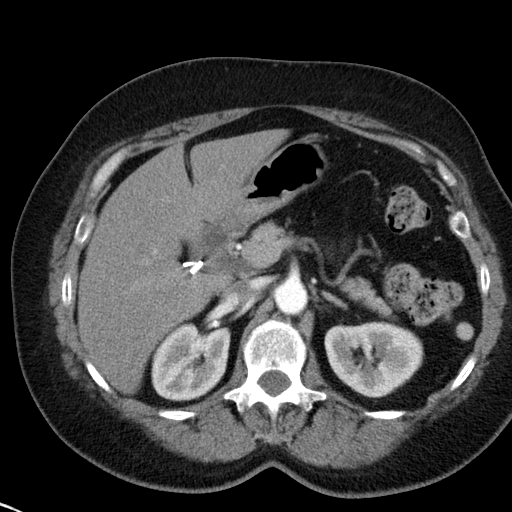
[im 5/59  lung]
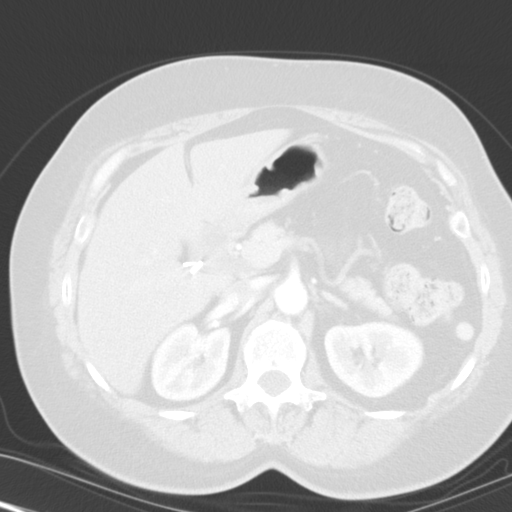
[im 9/59  lung]
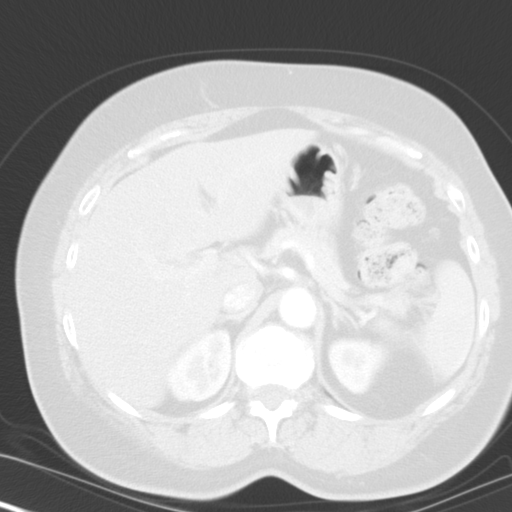
[im 13/59  lung]
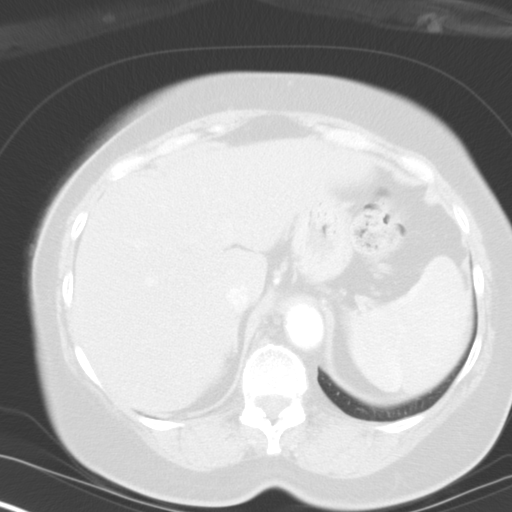
[im 18/59  lung]
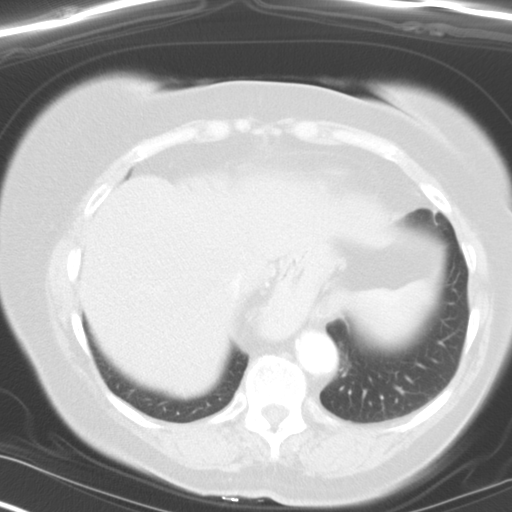
[im 22/59  mediastinal]
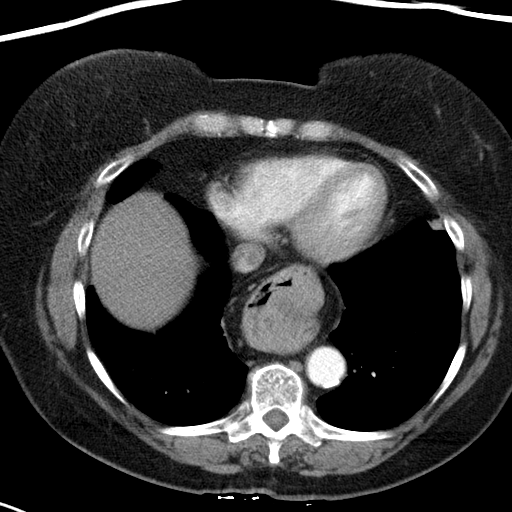
[im 22/59  lung]
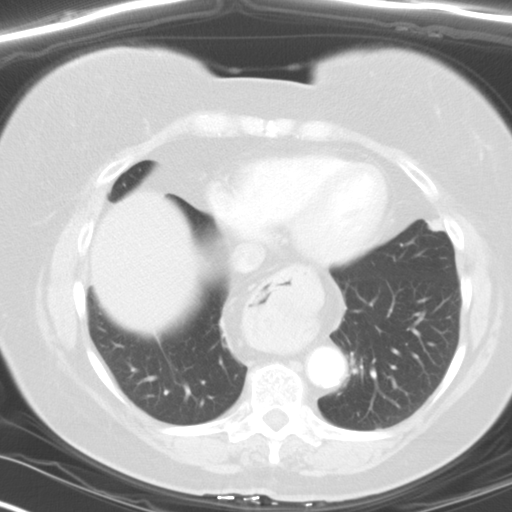
[im 26/59  lung]
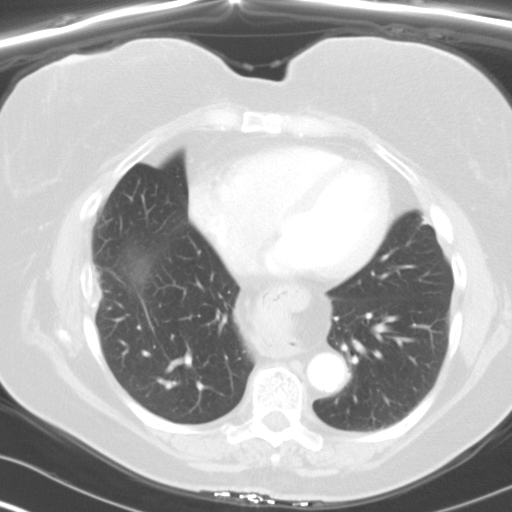
[im 33/59  lung]
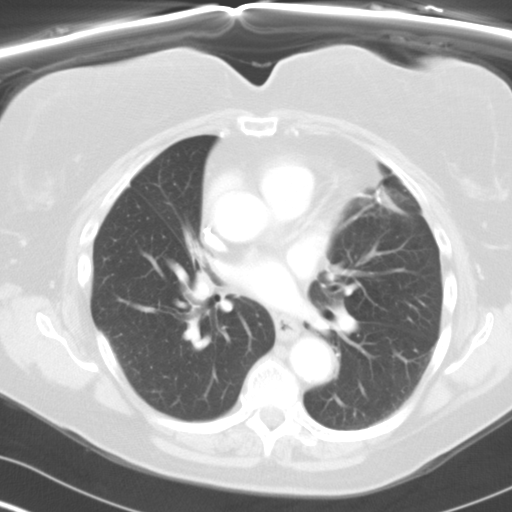
[im 37/59  lung]
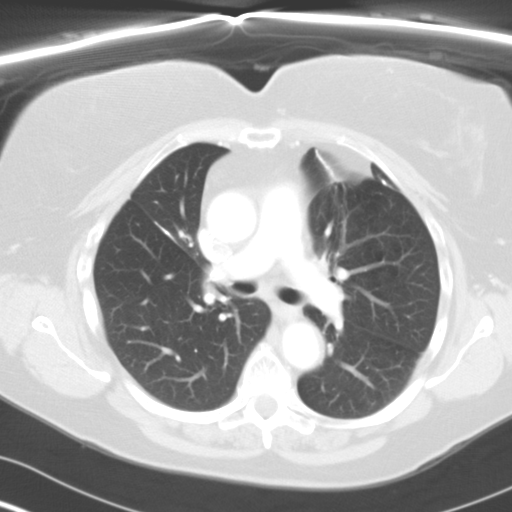
[im 41/59  mediastinal]
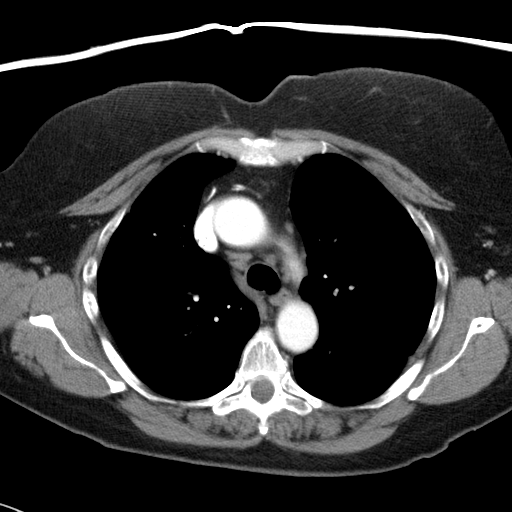
[im 41/59  lung]
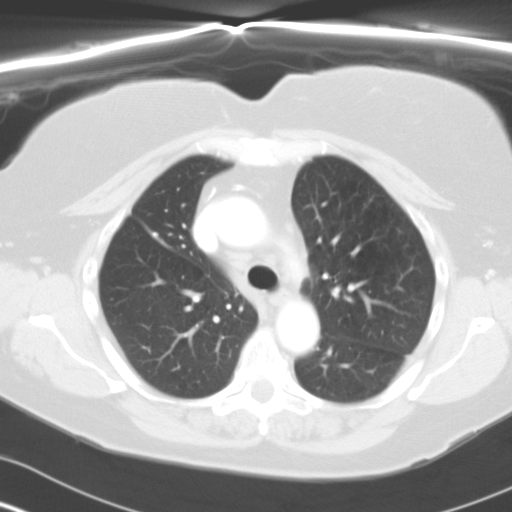
[im 46/59  lung]
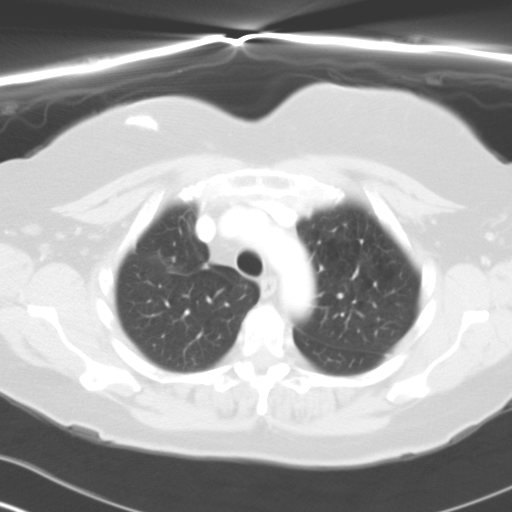
[im 50/59  lung]
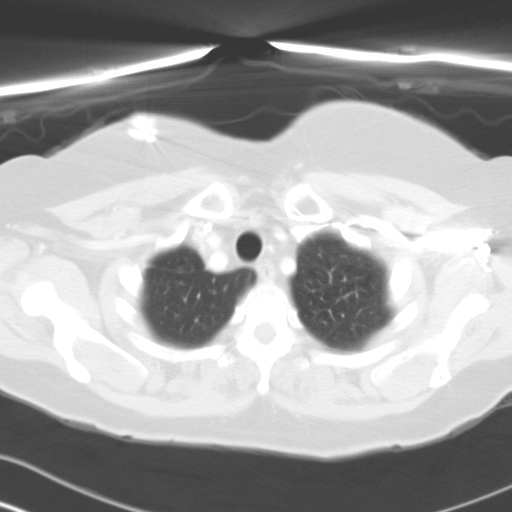
[im 54/59  lung]
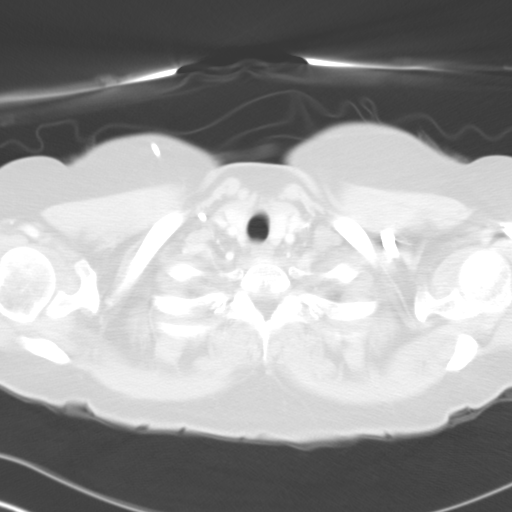

[Series 6: chest 2.0 coronal · coronal · 0.59mm/px · 3 of 151 slices shown]
[im 31/151  lung]
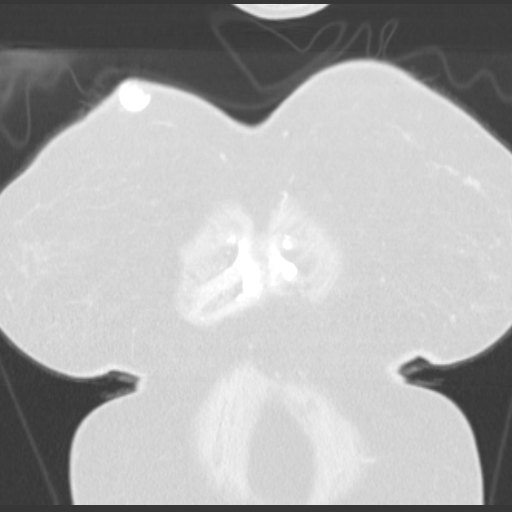
[im 61/151  lung]
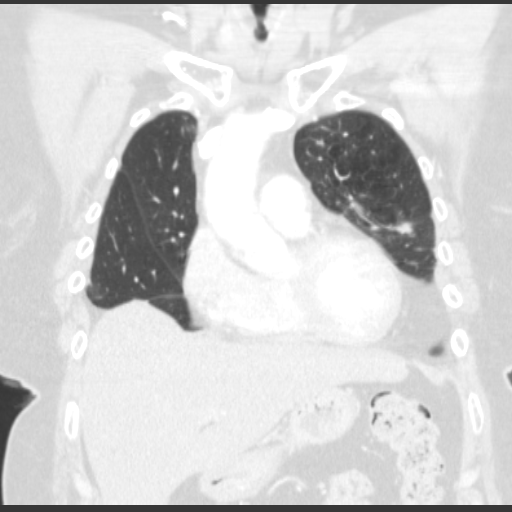
[im 91/151  lung]
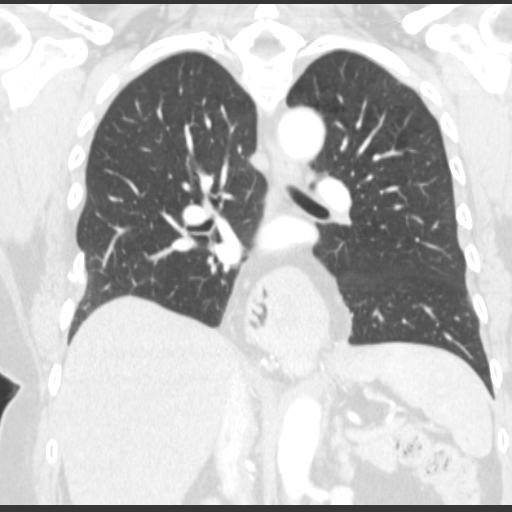

[15 of 36 positions shown; findings below may reference images not displayed]

FINDINGS: Lung windows demonstrate moderate centrilobular
emphysema.  Surgical changes likely of right upper lobectomy.
Minimal scar or atelectasis in the subpleural right lower lobe on
image 34 is unchanged.
A right lower lobe lung nodule measures 7 x 6 mm on image 29.
Similar versus minimally larger than on the prior exam where it
measured 6 x 5 mm.  Enlarged since the 06/05/2007 exam (image 33 of
that exam.)

Surgical changes in the left lung anteriorly.

Soft tissue windows demonstrate a right-sided Port-A-Cath which
terminates at the right atrium. Normal heart size without
pericardial or pleural effusion.  Low right paratracheal lymph node
measures 1.2 cm on image 19 and is unchanged.  This maintains its
fatty hilum.  No hilar adenopathy.

Moderate hiatal hernia.

Limited abdominal imaging demonstrates mild fatty infiltration of
the liver.  Splenule.  Cholecystectomy.  Normal adrenal glands. No
acute osseous abnormality. Right-sided rib defects are likely
postsurgical and are similar.
IMPRESSION: 1.  Right lower lobe lung nodule measuring 7 mm.  This appears
enlarged since the 06/05/2007 exam and may be minimally enlarged
since the 11/09/2007 exam.  This is suspicious for a isolated
metastasis or a metachronous primary.
2.  Otherwise, no evidence of recurrent or metastatic disease
status post right upper lobe and left lung resection.
3.  Moderate hiatal hernia.

## 2010-01-03 IMAGING — PT NM PET TUM IMG RESTAG (PS) SKULL BASE T - THIGH
6 series · 25 of 25 positions shown · non-contrast
Comparison: PET CT 02/24/1998 [DATE], CT thorax 02/12/2008

CLINICAL DATA: Subsequent treatment strategy for lung cancer.  Last
chemotherapy treatment February 2006

NUCLEAR MEDICINE PET SKULL BASE TO THIGH
Fasting Blood Glucose:  96
TECHNIQUE: 18.5 mCi F-18 FDG was injected intravenously via the
left antecubital fossa.  Full-ring PET imaging was performed from
the skull base through the mid-thighs 75  minutes after injection.
CT data was obtained and used for attenuation correction and
anatomic localization only.  (This was not acquired as a diagnostic
CT examination.)

[Series 1: pet ac · axial · 3.3mm · 4.69mm/px · z∈[-870,+0]mm · 6 of 267 slices shown]
[im 1/267]
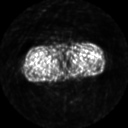
[im 54/267]
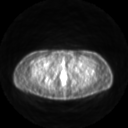
[im 107/267]
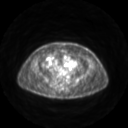
[im 160/267]
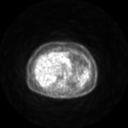
[im 213/267]
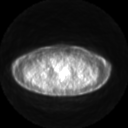
[im 267/267]
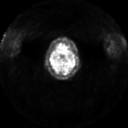

[Series 2: ct images · axial · 3.8mm · 0.98mm/px · z∈[-870,+0]mm · 6 of 267 slices shown]
[im 1/267]
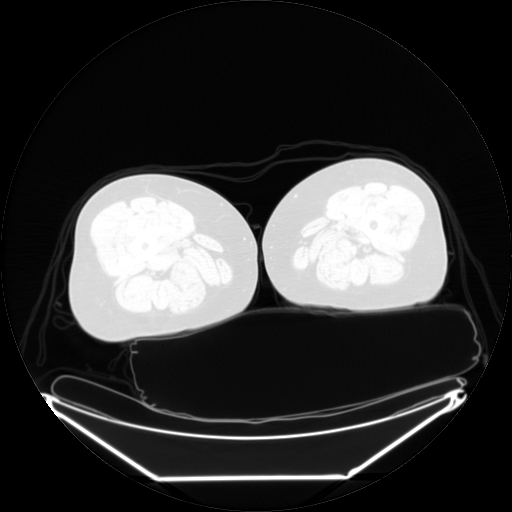
[im 54/267]
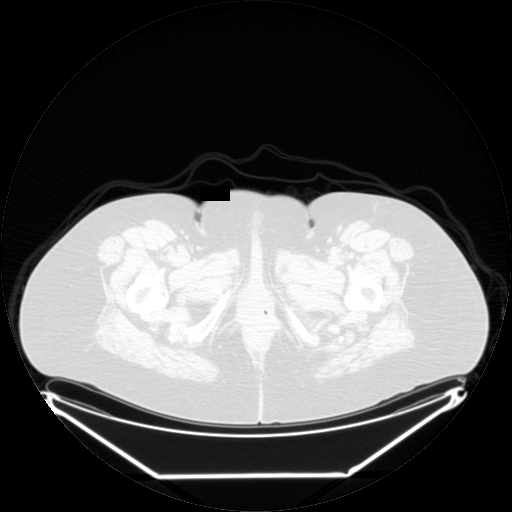
[im 107/267]
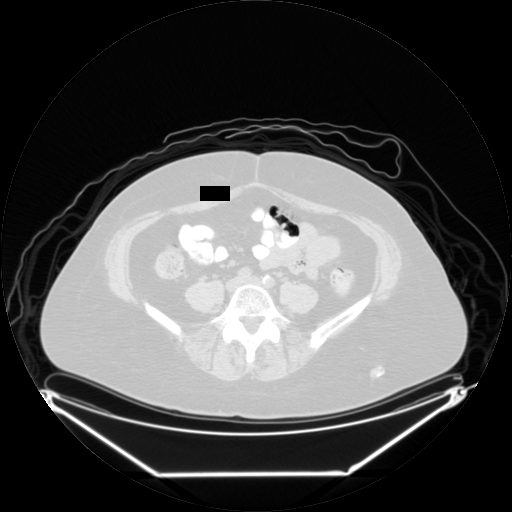
[im 160/267]
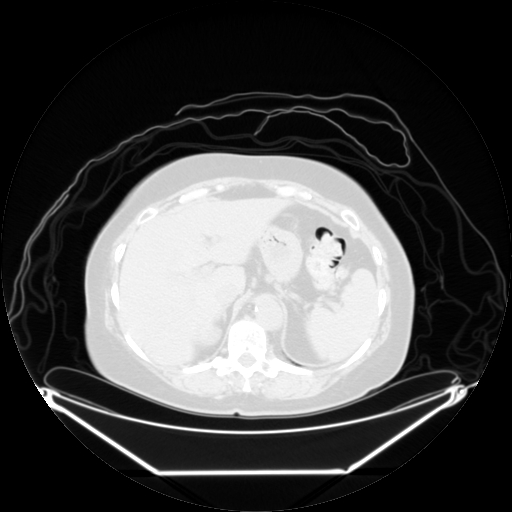
[im 213/267]
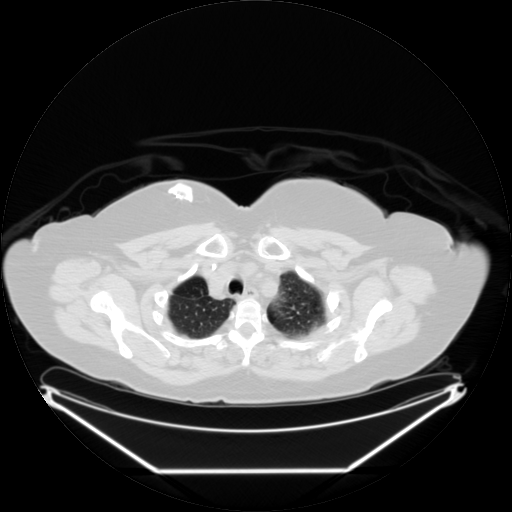
[im 267/267  brain]
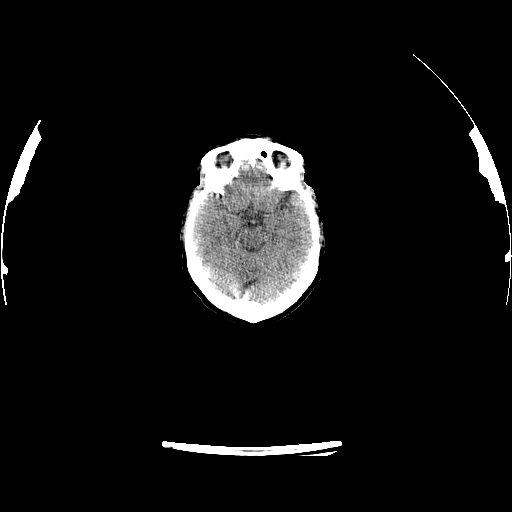

[Series 2: pet nac · axial · 3.3mm · 4.69mm/px · z∈[-870,+0]mm · 6 of 267 slices shown]
[im 1/267]
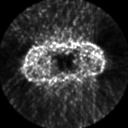
[im 54/267]
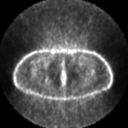
[im 107/267]
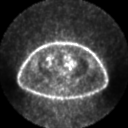
[im 160/267]
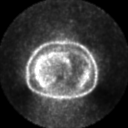
[im 213/267]
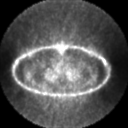
[im 267/267]
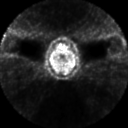

[Series 123: mip · coronal · 3.3mm · 4.69mm/px · 1 of 30 slices shown]
[im 1/30]
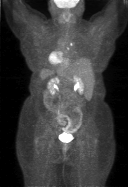

[Series 151: reformatted · axial · 3.3mm · 3.91mm/px · z∈[-706,-52]mm · 5 of 199 slices shown (1 of 2)]
[im 1/199]
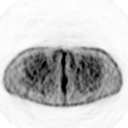
[im 50/199]
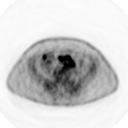
[im 100/199]
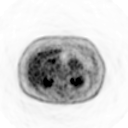
[im 149/199]
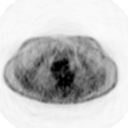
[im 199/199]
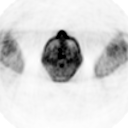

[Series 153: reformatted · coronal · 4.7mm · 6.98mm/px · 1 of 63 slices shown (2 of 2)]
[im 1/63]
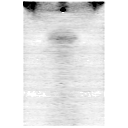

[25 of 25 positions shown; findings below may reference images not displayed]

FINDINGS: Neck: No hypermetabolic nodes in the neck.

Chest: There  is no discrete hypermetabolic activity associated
with the 7 mm right lower lobe nodule (image 79).    No
hypermetabolic mediastinal lymphadenopathy.  There is postsurgical
change seen along the right hemithorax which is not hypermetabolic.

Abdomen / Pelvis: No suspicious hypermetabolic activity within the
solid organs.  No evidence of abdominal or pelvic hypermetabolic
nodes. A single focus of hypermetabolic activity seen within the
left lateral hepatic lobe (image 109) is only present on one slice
and therefore felt most likely to be heterogeneous liver activity.
Moderate hiatal hernia.

Skeleton: No focal hypermetabolic activity to suggest skeletal
metastasis.
IMPRESSION: 1.  No PET evidence of lung cancer recurrence.
2.  No hypermetabolic activity associated with  7 mm right lower
lobe pulmonary nodule.  Of note this nodule is at the limit of
spatial resolution for PET imaging (8 to 10mm).  Recommend follow-
up with noncontrast CT to evaluate  for interval growth.

## 2010-01-04 ENCOUNTER — Ambulatory Visit: Payer: Self-pay | Admitting: Hematology & Oncology

## 2010-01-05 LAB — CBC WITH DIFFERENTIAL (CANCER CENTER ONLY)
BASO%: 0.5 % (ref 0.0–2.0)
EOS%: 1.3 % (ref 0.0–7.0)
LYMPH%: 26.3 % (ref 14.0–48.0)
MCH: 27.9 pg (ref 26.0–34.0)
MCHC: 33.3 g/dL (ref 32.0–36.0)
MCV: 84 fL (ref 81–101)
MONO%: 6.1 % (ref 0.0–13.0)
NEUT#: 3.9 10*3/uL (ref 1.5–6.5)
Platelets: 281 10*3/uL (ref 145–400)
RBC: 4.28 10*6/uL (ref 3.70–5.32)
RDW: 14.2 % (ref 10.5–14.6)

## 2010-01-05 LAB — COMPREHENSIVE METABOLIC PANEL
AST: 17 U/L (ref 0–37)
Albumin: 4.5 g/dL (ref 3.5–5.2)
Alkaline Phosphatase: 73 U/L (ref 39–117)
Potassium: 3.5 mEq/L (ref 3.5–5.3)
Sodium: 141 mEq/L (ref 135–145)
Total Bilirubin: 0.4 mg/dL (ref 0.3–1.2)
Total Protein: 7.1 g/dL (ref 6.0–8.3)

## 2010-01-26 LAB — CBC WITH DIFFERENTIAL (CANCER CENTER ONLY)
BASO#: 0 10*3/uL (ref 0.0–0.2)
BASO%: 0.6 % (ref 0.0–2.0)
EOS%: 2.7 % (ref 0.0–7.0)
HGB: 11.7 g/dL (ref 11.6–15.9)
LYMPH#: 1.1 10*3/uL (ref 0.9–3.3)
MCHC: 33.6 g/dL (ref 32.0–36.0)
MONO#: 0.5 10*3/uL (ref 0.1–0.9)
NEUT#: 3.7 10*3/uL (ref 1.5–6.5)
RDW: 15.4 % — ABNORMAL HIGH (ref 10.5–14.6)
WBC: 5.5 10*3/uL (ref 3.9–10.0)

## 2010-01-26 LAB — COMPREHENSIVE METABOLIC PANEL
ALT: 19 U/L (ref 0–35)
AST: 16 U/L (ref 0–37)
Albumin: 3.9 g/dL (ref 3.5–5.2)
Alkaline Phosphatase: 59 U/L (ref 39–117)
Glucose, Bld: 86 mg/dL (ref 70–99)
Potassium: 4.3 mEq/L (ref 3.5–5.3)
Sodium: 143 mEq/L (ref 135–145)
Total Protein: 6.2 g/dL (ref 6.0–8.3)

## 2010-02-07 IMAGING — CT CT CHEST W/ CM
2 of 3 series · 15 of 36 positions shown, 18 images · IV contrast (75CC OMNI 300)
Comparison: 02/12/2008, 11/09/2007, and multiple chest CT scans
dating back to 06/05/2007.

CLINICAL DATA: Lung cancer.  Please evaluate right lung nodule.

CT CHEST WITH CONTRAST
TECHNIQUE: Multidetector CT imaging of the chest was performed
following the standard protocol during bolus administration of
intravenous contrast.
Contrast: 75 ml of Umnipaque-9SS.

[Series 3: routine chest · axial · 0.70mm/px · z∈[-227,+3]mm · 12 of 54 slices shown, 15 images]
[im 4/54  mediastinal]
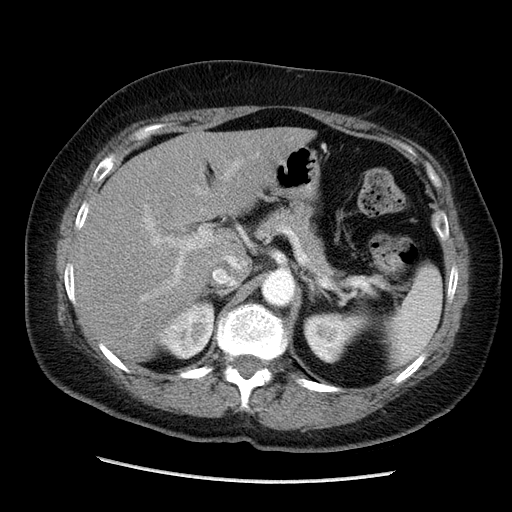
[im 4/54  lung]
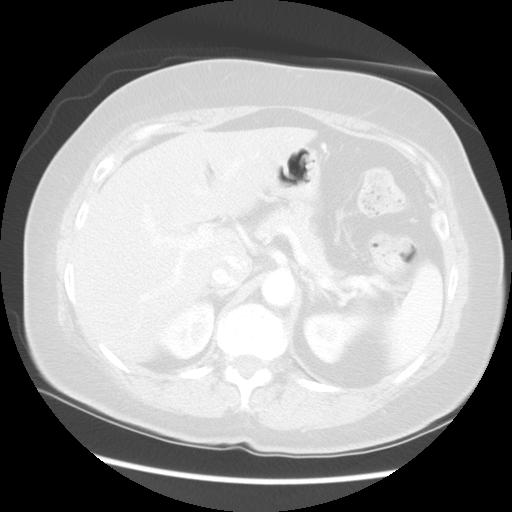
[im 8/54  lung]
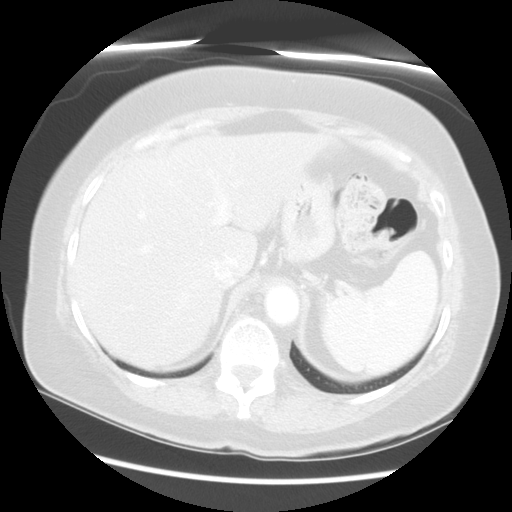
[im 12/54  lung]
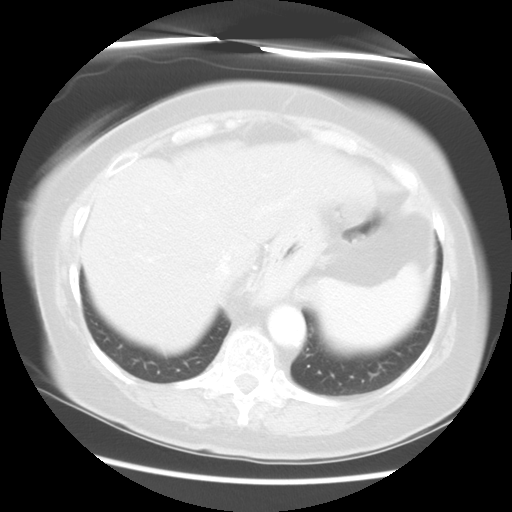
[im 16/54  lung]
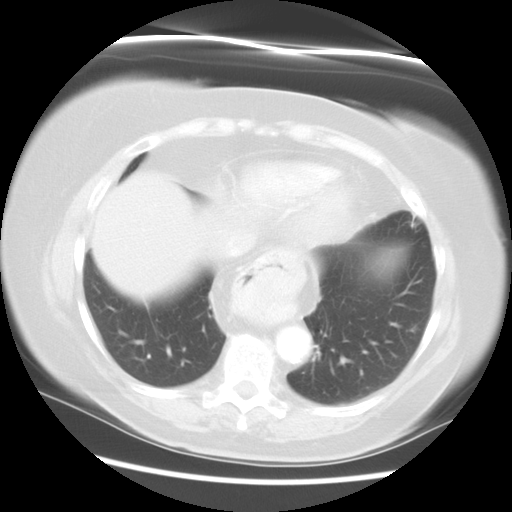
[im 20/54  mediastinal]
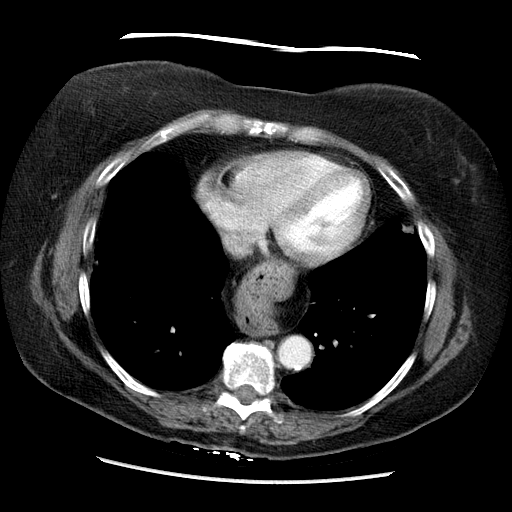
[im 20/54  lung]
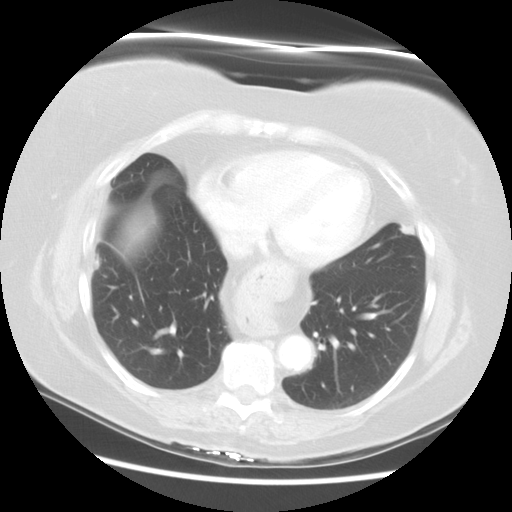
[im 24/54  lung]
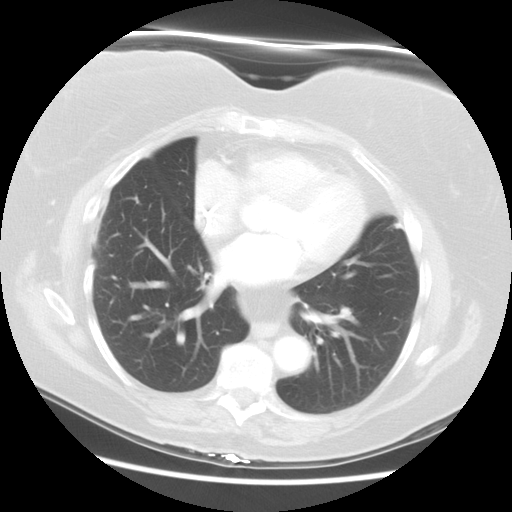
[im 30/54  lung]
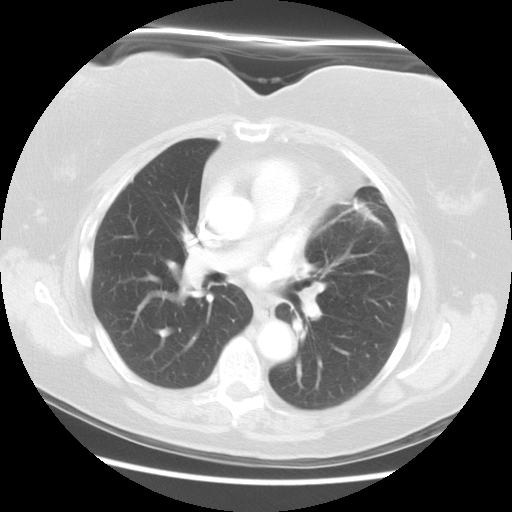
[im 34/54  lung]
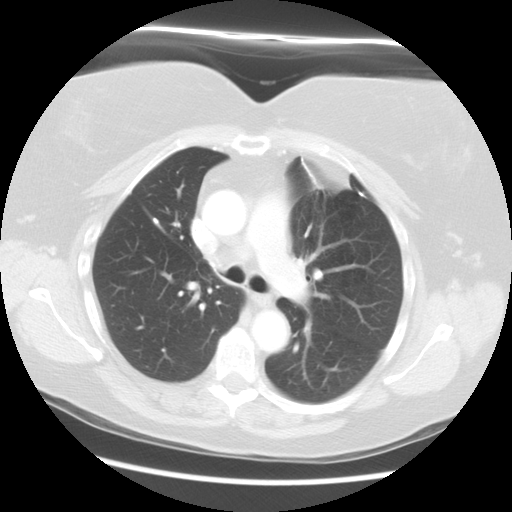
[im 38/54  mediastinal]
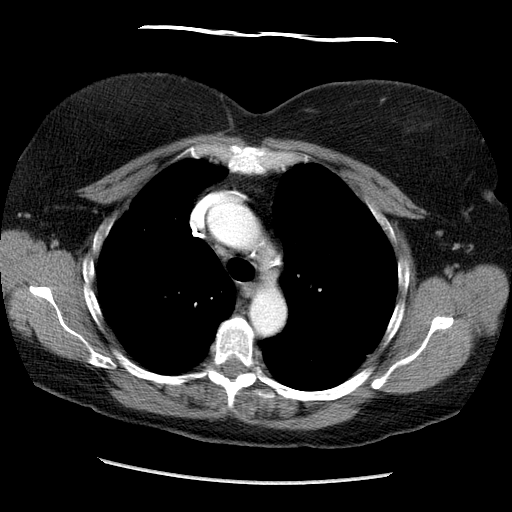
[im 38/54  lung]
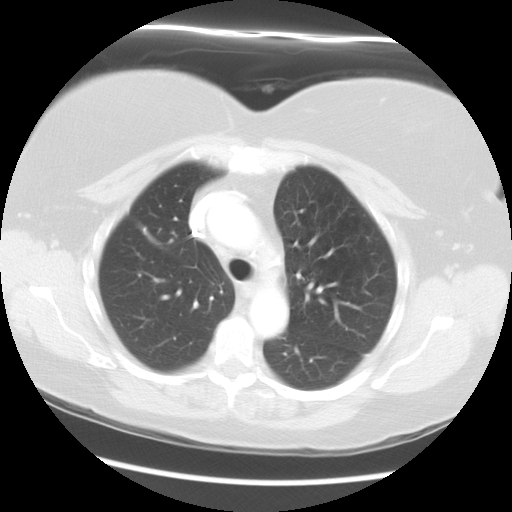
[im 42/54  lung]
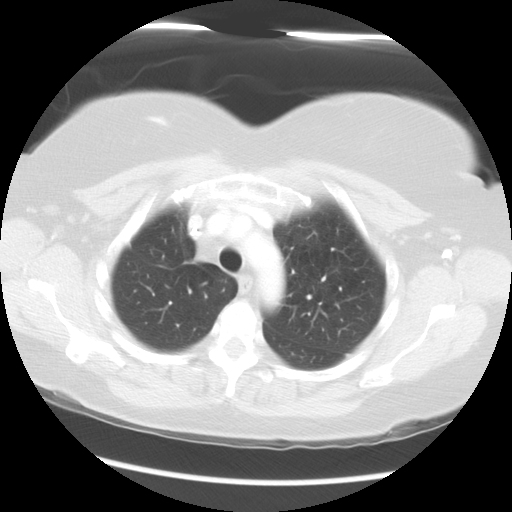
[im 46/54  lung]
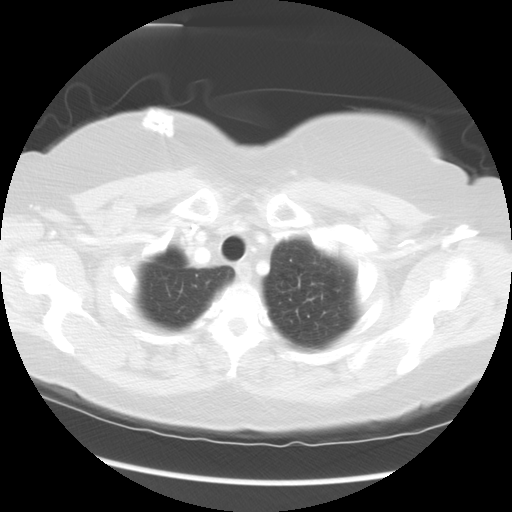
[im 50/54  lung]
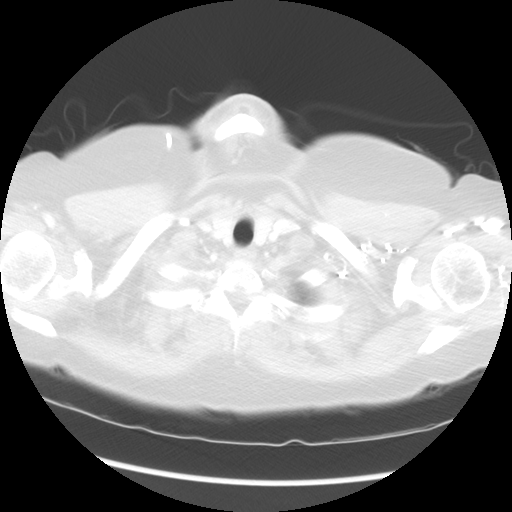

[Series 602: sagittal body · sagittal · 0.70mm/px · 3 of 144 slices shown]
[im 29/144  lung]
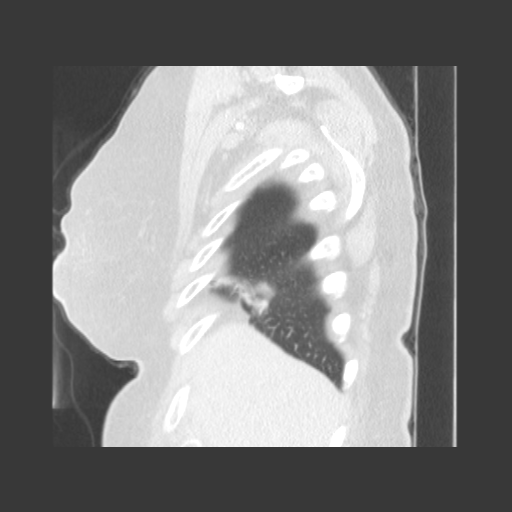
[im 58/144  lung]
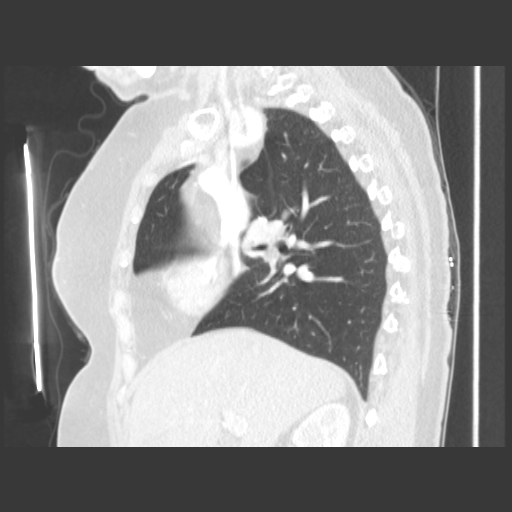
[im 86/144  lung]
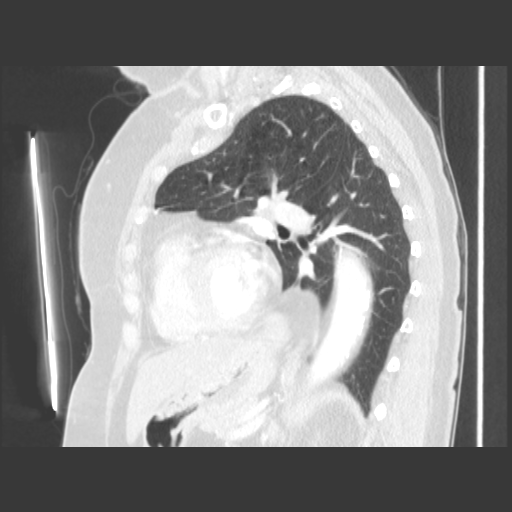

[15 of 36 positions shown; findings below may reference images not displayed]

FINDINGS: There is a 6 x 7 mm in size right lower lobe pulmonary
nodule present (image number 29 series 4).  This has not
significantly changed when compared with the 02/12/2008
examination.  However, when comparing this nodule with the multiple
prior examinations, this does represent an interval change and is
worrisome for a metastatic lesion or second primary tumor.  There
are no other pulmonary nodules.  There are mild scarring changes
within the left hemithorax.  There are no pathologically enlarged
mediastinal or hilar lymph nodes. There are stable changes of COPD
present. There is a moderately large hiatal hernia present.  The
adrenal glands have a normal appearance.  Bone windows settings
demonstrate no metastatic lesions.
IMPRESSION: 1.  6 x 7 mm in size right lower lobe pulmonary nodule worrisome
for a metastatic lesion or second primary tumor as discussed above.
2.  Changes of COPD- stable.
3.  Moderately large hiatal hernia.

## 2010-02-15 ENCOUNTER — Ambulatory Visit: Payer: Self-pay | Admitting: Hematology & Oncology

## 2010-02-23 LAB — CBC WITH DIFFERENTIAL (CANCER CENTER ONLY)
BASO#: 0 10*3/uL (ref 0.0–0.2)
BASO%: 0.6 % (ref 0.0–2.0)
EOS%: 1.8 % (ref 0.0–7.0)
Eosinophils Absolute: 0.1 10*3/uL (ref 0.0–0.5)
HCT: 35.8 % (ref 34.8–46.6)
HGB: 11.9 g/dL (ref 11.6–15.9)
LYMPH#: 1.7 10*3/uL (ref 0.9–3.3)
LYMPH%: 24.3 % (ref 14.0–48.0)
MCH: 29.2 pg (ref 26.0–34.0)
MCHC: 33.3 g/dL (ref 32.0–36.0)
MCV: 87 fL (ref 81–101)
MONO#: 0.5 10*3/uL (ref 0.1–0.9)
MONO%: 7.9 % (ref 0.0–13.0)
NEUT#: 4.5 10*3/uL (ref 1.5–6.5)
NEUT%: 65.4 % (ref 39.6–80.0)
Platelets: 269 10*3/uL (ref 145–400)
RBC: 4.09 10*6/uL (ref 3.70–5.32)
RDW: 16.2 % — ABNORMAL HIGH (ref 10.5–14.6)
WBC: 6.8 10*3/uL (ref 3.9–10.0)

## 2010-02-23 LAB — COMPREHENSIVE METABOLIC PANEL WITH GFR
ALT: 60 U/L — ABNORMAL HIGH (ref 0–35)
AST: 21 U/L (ref 0–37)
Albumin: 4.5 g/dL (ref 3.5–5.2)
Alkaline Phosphatase: 109 U/L (ref 39–117)
BUN: 15 mg/dL (ref 6–23)
CO2: 20 meq/L (ref 19–32)
Calcium: 9.3 mg/dL (ref 8.4–10.5)
Chloride: 105 meq/L (ref 96–112)
Creatinine, Ser: 0.81 mg/dL (ref 0.40–1.20)
Glucose, Bld: 123 mg/dL — ABNORMAL HIGH (ref 70–99)
Potassium: 4.1 meq/L (ref 3.5–5.3)
Sodium: 140 meq/L (ref 135–145)
Total Bilirubin: 0.4 mg/dL (ref 0.3–1.2)
Total Protein: 6.8 g/dL (ref 6.0–8.3)

## 2010-02-25 ENCOUNTER — Other Ambulatory Visit: Payer: Self-pay | Admitting: Hematology & Oncology

## 2010-02-25 DIAGNOSIS — C3490 Malignant neoplasm of unspecified part of unspecified bronchus or lung: Secondary | ICD-10-CM

## 2010-02-28 ENCOUNTER — Encounter: Payer: Self-pay | Admitting: Interventional Radiology

## 2010-02-28 ENCOUNTER — Encounter: Payer: Self-pay | Admitting: Hematology & Oncology

## 2010-02-28 ENCOUNTER — Encounter: Payer: Self-pay | Admitting: Cardiothoracic Surgery

## 2010-03-17 ENCOUNTER — Ambulatory Visit (HOSPITAL_COMMUNITY)
Admission: RE | Admit: 2010-03-17 | Discharge: 2010-03-17 | Disposition: A | Discharge status: Home / Self Care | Payer: Managed Care, Other (non HMO) | Source: Ambulatory Visit | Attending: Hematology & Oncology | Admitting: Hematology & Oncology

## 2010-03-17 ENCOUNTER — Encounter (HOSPITAL_COMMUNITY): Payer: Self-pay

## 2010-03-17 DIAGNOSIS — K449 Diaphragmatic hernia without obstruction or gangrene: Secondary | ICD-10-CM | POA: Insufficient documentation

## 2010-03-17 DIAGNOSIS — R599 Enlarged lymph nodes, unspecified: Secondary | ICD-10-CM | POA: Insufficient documentation

## 2010-03-17 DIAGNOSIS — C3490 Malignant neoplasm of unspecified part of unspecified bronchus or lung: Secondary | ICD-10-CM | POA: Insufficient documentation

## 2010-03-17 DIAGNOSIS — Z79899 Other long term (current) drug therapy: Secondary | ICD-10-CM | POA: Insufficient documentation

## 2010-03-17 HISTORY — DX: Malignant (primary) neoplasm, unspecified: C80.1

## 2010-03-17 MED ORDER — FLUDEOXYGLUCOSE F - 18 (FDG) INJECTION
17.2000 | Freq: Once | INTRAVENOUS | Status: AC | PRN
  Administered 2010-03-17: 17.2 via INTRAVENOUS

## 2010-03-25 ENCOUNTER — Encounter (HOSPITAL_BASED_OUTPATIENT_CLINIC_OR_DEPARTMENT_OTHER): Payer: Medicare Other | Admitting: Hematology & Oncology

## 2010-03-25 ENCOUNTER — Other Ambulatory Visit: Payer: Self-pay | Admitting: Hematology & Oncology

## 2010-03-25 DIAGNOSIS — C3410 Malignant neoplasm of upper lobe, unspecified bronchus or lung: Secondary | ICD-10-CM

## 2010-03-25 LAB — CBC WITH DIFFERENTIAL (CANCER CENTER ONLY)
BASO#: 0 10*3/uL (ref 0.0–0.2)
Eosinophils Absolute: 0.1 10*3/uL (ref 0.0–0.5)
HCT: 36.2 % (ref 34.8–46.6)
HGB: 12 g/dL (ref 11.6–15.9)
LYMPH%: 16.2 % (ref 14.0–48.0)
MCH: 29.6 pg (ref 26.0–34.0)
MCV: 89 fL (ref 81–101)
MONO%: 7.9 % (ref 0.0–13.0)
NEUT%: 73.2 % (ref 39.6–80.0)
RBC: 4.06 10*6/uL (ref 3.70–5.32)

## 2010-03-25 LAB — COMPREHENSIVE METABOLIC PANEL
Alkaline Phosphatase: 78 U/L (ref 39–117)
BUN: 14 mg/dL (ref 6–23)
CO2: 24 mEq/L (ref 19–32)
Glucose, Bld: 82 mg/dL (ref 70–99)
Total Bilirubin: 0.4 mg/dL (ref 0.3–1.2)

## 2010-03-25 LAB — LACTATE DEHYDROGENASE: LDH: 265 U/L — ABNORMAL HIGH (ref 94–250)

## 2010-04-22 LAB — COMPREHENSIVE METABOLIC PANEL
ALT: 20 U/L (ref 0–35)
AST: 20 U/L (ref 0–37)
Albumin: 4.1 g/dL (ref 3.5–5.2)
Alkaline Phosphatase: 75 U/L (ref 39–117)
BUN: 10 mg/dL (ref 6–23)
CO2: 27 mEq/L (ref 19–32)
Calcium: 9.3 mg/dL (ref 8.4–10.5)
Chloride: 104 mEq/L (ref 96–112)
Creatinine, Ser: 0.68 mg/dL (ref 0.4–1.2)
GFR calc Af Amer: >60 mL/min (ref >60)
GFR calc non Af Amer: >60 mL/min (ref >60)
Glucose, Bld: 82 mg/dL (ref 70–99)
Potassium: 3.9 mEq/L (ref 3.5–5.1)
Sodium: 139 mEq/L (ref 135–145)
Total Bilirubin: 0.5 mg/dL (ref 0.3–1.2)
Total Protein: 7.2 g/dL (ref 6.0–8.3)

## 2010-04-22 LAB — PROTIME-INR
INR: 0.93 (ref 0.00–1.49)
Prothrombin Time: 12.7 seconds (ref 11.6–15.2)

## 2010-04-22 LAB — CULTURE, RESPIRATORY W GRAM STAIN
Culture: NO GROWTH
Gram Stain: NONE SEEN

## 2010-04-22 LAB — CBC
HCT: 38 % (ref 36.0–46.0)
Hemoglobin: 12.3 g/dL (ref 12.0–15.0)
MCH: 27.3 pg (ref 26.0–34.0)
MCHC: 32.4 g/dL (ref 30.0–36.0)
MCV: 84.4 fL (ref 78.0–100.0)
Platelets: 222 10*3/uL (ref 150–400)
RBC: 4.5 MIL/uL (ref 3.87–5.11)
RDW: 15.6 % — ABNORMAL HIGH (ref 11.5–15.5)
WBC: 8.8 10*3/uL (ref 4.0–10.5)

## 2010-04-22 LAB — TYPE AND SCREEN
ABO/RH(D): A POS
Antibody Screen: NEGATIVE

## 2010-04-22 LAB — APTT: aPTT: 30 seconds (ref 24–37)

## 2010-04-22 LAB — SURGICAL PCR SCREEN
MRSA, PCR: NEGATIVE
Staphylococcus aureus: NEGATIVE

## 2010-04-29 ENCOUNTER — Other Ambulatory Visit: Payer: Self-pay | Admitting: Hematology & Oncology

## 2010-04-29 ENCOUNTER — Encounter (HOSPITAL_BASED_OUTPATIENT_CLINIC_OR_DEPARTMENT_OTHER): Payer: Medicare Other | Admitting: Hematology & Oncology

## 2010-04-29 DIAGNOSIS — C3490 Malignant neoplasm of unspecified part of unspecified bronchus or lung: Secondary | ICD-10-CM

## 2010-04-29 DIAGNOSIS — C3410 Malignant neoplasm of upper lobe, unspecified bronchus or lung: Secondary | ICD-10-CM

## 2010-04-29 DIAGNOSIS — Z5111 Encounter for antineoplastic chemotherapy: Secondary | ICD-10-CM

## 2010-04-29 LAB — COMPREHENSIVE METABOLIC PANEL
AST: 16 U/L (ref 0–37)
Albumin: 4.3 g/dL (ref 3.5–5.2)
BUN: 11 mg/dL (ref 6–23)
Calcium: 9 mg/dL (ref 8.4–10.5)
Chloride: 101 mEq/L (ref 96–112)
Creatinine, Ser: 0.71 mg/dL (ref 0.40–1.20)
Glucose, Bld: 85 mg/dL (ref 70–99)
Potassium: 3.8 mEq/L (ref 3.5–5.3)

## 2010-04-29 LAB — CBC WITH DIFFERENTIAL (CANCER CENTER ONLY)
BASO#: 0.1 10*3/uL (ref 0.0–0.2)
Eosinophils Absolute: 0.1 10*3/uL (ref 0.0–0.5)
HCT: 35.8 % (ref 34.8–46.6)
HGB: 11.9 g/dL (ref 11.6–15.9)
LYMPH#: 1.5 10*3/uL (ref 0.9–3.3)
MONO#: 0.6 10*3/uL (ref 0.1–0.9)
NEUT#: 4.7 10*3/uL (ref 1.5–6.5)
NEUT%: 67.2 % (ref 39.6–80.0)
RBC: 4.04 10*6/uL (ref 3.70–5.32)

## 2010-05-28 ENCOUNTER — Other Ambulatory Visit: Payer: Self-pay | Admitting: Hematology & Oncology

## 2010-05-28 DIAGNOSIS — C3410 Malignant neoplasm of upper lobe, unspecified bronchus or lung: Secondary | ICD-10-CM

## 2010-06-01 ENCOUNTER — Other Ambulatory Visit (HOSPITAL_COMMUNITY): Payer: Managed Care, Other (non HMO)

## 2010-06-22 NOTE — Discharge Summary (Signed)
[name], NILSEN NO.:  [id number]   MEDICAL RECORD NO.:  [id number]          PATIENT TYPE:  INP   LOCATION:  3303                         FACILITY:  [facility]   PHYSICIAN:  Sheliah Plane, MD    DATE OF BIRTH:  1950-09-18   DATE OF ADMISSION:  01/10/2007  DATE OF DISCHARGE:                               DISCHARGE SUMMARY   FINAL DIAGNOSIS:  Left lung mass, history of adenocarcinoma of the right  lung.  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   IN-HOSPITAL DIAGNOSES:  Increased LFTs.   SECONDARY DIAGNOSES:  1. Hypertension.  2. Hyperlipidemia.  3. 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 Jorge Mandril at [facility]      several years prior to the patient's previous lobectomy.  4. History of adenocarcinoma of the right lung.   OPERATIONS AND PROCEDURES:  Bronchoscopy with left video-assisted  thoracoscopic surgery with mini thoracotomy, wedge resection of left  upper lobe and lymph node dissection.   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  The patient had undergone four cycles of chemotherapy.  Under close  postoperative surveillance, a new small lesion was noted in the left  upper lobe that had grown slightly over the past 3 months.  PET scan  done was indeterminate of the lesion.  No other areas were suggestive  for recurrent disease.  A resection was recommended.  The patient was  seen and evaluated by Dr. Tyrone Sage.  Dr. Tyrone Sage discussed the patient  undergoing resection of this lesion.  He discussed the risks and  benefits with the patient.  The patient acknowledged her understanding  and agreed to proceed. Surgery was scheduled for January 12, 2007.  For  details of the patient's past medical history and  physical exam please  see dictated H&P.   The patient was taken to the operating room January 10, 2007 where she  underwent bronchoscopy with left video-assisted thoracoscopic procedure  with mini thoracotomy, wedge resection of the left upper lobe and lymph  node dissection.  The patient tolerated this procedure well and was  transferred to the intensive care unit in stable condition.  The  patient's pathology report came back showing positive for  adenocarcinoma, marked bronchioalveolar type T1N0MX.  Postoperatively  the patient was extubated following surgery.  She was noted to be  hemodynamically stable postoperatively.  Post extubation the patient was  alert and oriented  x4.  Neuro intact.  Chest x-rays obtained  postoperatively were stable.  The patient had no air leak and no  pneumothorax. Her pulmonary status was stable and she was felt to be  ready for transfer to 3300 day one.  By day 2, the patient's chest x-ray  was cleared and chest tube was DC'd in normal fashion.  Postoperative  chest x-ray obtained after removal of chest tube showed no pneumothorax  and stable.  Unfortunately on postop day #2, the patient had a CMP done  which showed increase in the patient's LFTs. This was repeated and LFTs  were persistently increased. They were elevated to 394 for an AST with  an ALT of 688 and an alk phos of 152.  Total bilirubin was normal at  1.0.  The patient had preoperative tests done which were all within  normal limits. The patient's Tylenol and Percocet were discontinued and  she was started on OxyIR. Repeat LFTs were obtained the next several  days.  By December 8, the__________  continued to be decreasing.  The  patient's AST had dropped to 52, ALT was 197.  The patient's alk phos  was __________  elevated at 140.  Her total bilirubin was 0.9.  Plan for  the patient to follow up with primary MD in the next several weeks for  reevaluation of her liver panel.  The patient was  tentatively ready for  discharge home January 14, 2007 but was having pain management control.  She was continued on the OxyIR and a fentanyl patch was added.  This did  assist the patient with pain.   Dictation ended at this point.      Theda Belfast, [place]      Sheliah Plane, MD  Electronically Signed    KMD/MEDQ  D:  01/15/2007  T:  01/15/2007  Job:  [id number]   cc:   Rose Phi. Myna Hidalgo, M.D.

## 2010-06-22 NOTE — H&P (Signed)
[name], [name]                ACCOUNT NO.:  [id number]   MEDICAL RECORD NO.:  [id number]          PATIENT TYPE:  INP   LOCATION:  1425                         FACILITY:  [facility]   PHYSICIAN:  Beckey Rutter, MD  DATE OF BIRTH:  [birth year]   DATE OF ADMISSION:  05/15/2007  DATE OF DISCHARGE:                              HISTORY & PHYSICAL   PRIMARY CARE PHYSICIAN:  This patient is unassigned to [facility].   PRIMARY ONCOLOGIST:  Rose Phi. Myna Hidalgo, M.D.   CHIEF COMPLAINT:  Syncope.   HISTORY OF PRESENT ILLNESS:  This is a 60 year old Caucasian female with  past medical history significant for adenocarcinoma of the lung status  post surgery twice and is status post chemotherapy twice.  The patient's  last chemotherapy session was seven days ago.  Since the last  chemotherapy, the patient was feeling body aches and pains.  The patient  was generally weak and she felt she was unable to eat entire portion of  food because of change in the taste.  The patient went to the bathroom  today and after finishing her bowel movements and the patient felt very  weak and she started to call her husband.  The patient could not  recognize anything other than she was in the floor afterwards talking to  her husband and feeling weak.  The husband is in the room and he stated  that he was called and by the time he gets there the patient was very  weak and she was about to collapse to the floor.  He slowly reclined her  down to the floor with a pillow underneath her head and then rushed to  call emergency medical personnel.   The patient denied any fever, headache, dizziness prior to this event.  As per husband, he did not notice any abnormal movement.  The patient  did not lose control of the urine or stool.   PAST MEDICAL HISTORY:  Significant for  1. Lung cancer, status post surgery and chemotherapy.  2. Hypertension.  3. Hyperlipidemia.  4. History of left-sided paraplegia related to  aneurysmal bleed which      was surgically corrected by Dr.Glassner at [facility] several      years ago.   SOCIAL HISTORY:  The patient lives at home with her husband, denied  tobacco abuse, ethanol abuse or drug abuse.   FAMILY HISTORY:  Noncontributory.   MEDICATION ALLERGIES:  1. DEMEROL.  2. TOPAMAX.   MEDICATION:  Protonix 40 mg daily.   REVIEW OF SYSTEMS:  A 12-point review of systems is noncontributory.  The rest as per HPI.   PHYSICAL EXAMINATION:  VITAL SIGNS:  First blood pressure recorded in  the ER is 95/66, pulse at that time was 84, respiratory rate is 22,  temperature is 94.9.  Orthostatic blood pressure was showing 94/67 on  the right arm when she was lying, and 98/76 in the right arm when she is  standing, pulse rate moved from 85 when she was lying to 95 when she was  standing up.  Blood pressure while she was  sitting was 113/77 and the  pulse rate was 96.  In the with a classical.  Current respiratory rate  is 18.  HEENT:  Head:  The patient has a bald head with no obvious trauma.  Eyes:  PERRL.  Mouth moist.  The patient has hard palate deformity.  NECK:  Supple.  No JVD.  LUNGS:  Bilateral decreased air entry.  PRECORDIUM:  First and second heart sounds audible.  ABDOMEN:  Soft, nontender.  Bowel sounds present.  EXTREMITIES:  No obvious lower extremities edema.  NEUROLOGIC:  She is alert and oriented x3.   LABS AND X-RAY:  Her blood lipase is 23, amylase is 38.  Urinalysis is  showing yellow clear urine with negative nitrate and negative leukocyte  esterase.  White blood count is 15.1, hemoglobin is 12, hematocrit is  35.5, platelet count is 338.  Sodium is 131, potassium 4.1, chloride 99,  bicarb is 24, glucose is 136, BUN is 20, creatinine is 0.83.   Chest x-ray was showing no active disease.   EKG documented in the paper chart.   ASSESSMENT AND PLAN:  This is a 60 year old Caucasian female with  syncope, likely vasovagal activity.   Nevertheless, the patient will be  admitted for further assessment and management.  The patient will be  ruled out for acute coronary syndrome and she will be monitored on the  telemetry floor.  The patient will be hydrated and we will watch for  better hydration status.   I am not convinced the patient required full syncope workup at this time  because of the clear history of vasovagal activity.  Nevertheless,  further assessment will be conducted during the hospital stay.  For deep  venous thrombosis prophylaxis will start Lovenox.  For gastrointestinal  prophylaxis, will continue on Protonix.      Beckey Rutter, MD  Electronically Signed     EME/MEDQ  D:  05/15/2007  T:  05/16/2007  Job:  [id number]   cc:   Rose Phi. Myna Hidalgo, M.D.  Fax: [phone]

## 2010-06-22 NOTE — Assessment & Plan Note (Signed)
OFFICE VISIT   [name], [name]  DOB:  1950/10/22                                        March 19, 2007  CHART #:  [id number]   The patient returns today after initially a right upper lobectomy and  lymph node dissection done in 12/2003.  Subsequently she developed a  left lung mass and underwent wedge resection of the left upper lobe  lesion and lymph node dissection in 01/2007.  She has done well  postoperatively and has now had a right subclavian port placed and  started on first four cycles of Taxotere.  She notes that she seems to  have tolerated this well and goes back next week for the second cycle.   PHYSICAL EXAMINATION:  VITALS:  On exam her blood pressure is 117/76,  pulse is 98, respiratory rate is 18.  O2 sats 96%.  CHEST:  Her left chest incision is well healed.  I do not appreciate any  cervical, supraclavicular adenopathy.  LUNGS:  Clear bilaterally.   Follow-up chest x-ray shows good position of her port and without any  other new findings.   PLAN:  I plan to see her back in late May of June after she completes  her chemotherapy and presumably has a follow-up CT scan through Dr.  Myna Hidalgo.   Sheliah Plane, MD  Electronically Signed   EG/MEDQ  D:  03/19/2007  T:  03/20/2007  Job:  [id number]   cc:   Rose Phi. Myna Hidalgo, M.D.

## 2010-06-22 NOTE — Op Note (Signed)
NAMEBRILEY, BUMGARNER NO.:  [id number]   MEDICAL RECORD NO.:  [id number]          PATIENT TYPE:  INP   LOCATION:  3303                         FACILITY:  [facility]   PHYSICIAN:  Sheliah Plane, MD    DATE OF BIRTH:  May 04, 1950   DATE OF PROCEDURE:  01/12/2007  DATE OF DISCHARGE:                               OPERATIVE REPORT   PREOPERATIVE DIAGNOSES:  1. Left lung mass.  2. History of adenocarcinoma of the right lung.   POSTOPERATIVE DIAGNOSES:  1. Left lung mass.  2. History of adenocarcinoma of the right lung.  3. Adenocarcinoma of the left lung.   PROCEDURE PERFORMED:  Bronchoscopy left video-assisted thoracoscopy,  minithoracotomy, wedge resection of left upper lobe and lymph node  dissection.   SURGEON:  Sheliah Plane, M.D.   FIRST ASSISTANT:  Zadie Rhine, P.A.-C.   BRIEF HISTORY:  The patient is a 60 year old female who in 2005 had  undergone a right upper lobectomy for adenocarcinoma of the lung with  bronchioalveolar features, T1 N1 tumor.  The patient had undergone 4  cycles of the chemotherapy.  Under close postoperative surveillance, a  new small lesion was noted in the left upper lobe that had grown  slightly over the past 3 months.  PET scan was indeterminate for the  lesion.  No other areas were suggestive of recurrent disease.  A  resection was recommended.  The patient agreed and signed informed  consent.   DESCRIPTION OF PROCEDURE:  The patient underwent general endotracheal  anesthesia.  Through the endotracheal tube, a fiberoptic bronchoscope  was passed to the subsegmental level, both in the right and left lung.  The healed brought bronchial stump of the right upper lobectomy was  identified and appeared normal.  No other endobronchial lesions were  noted.  The patient was turned to the lateral decubitus position with  left side up after placement of a double-lumen endotracheal tube.  The  left chest was prepped with Betadine  and draped in the usual sterile  manner.  In the midaxillary line, approximately the fourth intercostal  space, a small incision was made and poor access was made at the site.  A 30-degree scope was which used to examine the chest.  No obvious  pleural metastasis were noted.  On careful examination of the lung, the  site of the tumor was not obvious.  Tumor was approximately 7 mm in  size.  In order to locate the tumor, a small anterior thoracotomy was  performed large enough to palpate the lung.  Suspicious area was located  using pulmonary staplers.  A wedge resection of the area was performed.  This segment of the lung was submitted to the pathologist.  Frozen  section of the margins were free of tumor.  The primary was confirmed to  be adenocarcinoma.  Through the small incision and through the port  access, intralobar lymph nodes R11 were obtained.  A 28 chest tube was  left in place.  The lung was reinflated.  A single intercostal stitch  was placed.  The incisions closed with interrupted  0 Vicryl, 3-0 Vicryl  subcutaneous tissue and a 4-  0 subcuticular stitch in skin edges.  Dry dressings were applied.  Sponge and needle count was reported as correct at completion of the  procedure.  The patient tolerated the procedure without obvious  complication minimal blood loss.  The patient was extubated in the  operating room and transferred to the recovery room for further  postoperative care.      Sheliah Plane, MD  Electronically Signed     EG/MEDQ  D:  01/12/2007  T:  01/13/2007  Job:  [id number]   cc:   Rose Phi. Myna Hidalgo, M.D.

## 2010-06-22 NOTE — H&P (Signed)
[name], [name] NO.:  [id number]   MEDICAL RECORD NO.:  [id number]          PATIENT TYPE:  INP   LOCATION:  NA                           FACILITY:  [facility]   PHYSICIAN:  Sheliah Plane, MD    DATE OF BIRTH:  1950/07/17   DATE OF ADMISSION:  DATE OF DISCHARGE:                              HISTORY & PHYSICAL   REFERRING PHYSICIAN:  Theron Arista R. Myna Hidalgo, M.D.   PULMONARY PHYSICIAN:  Tanvir A. Chodri, M.D.   REASON FOR CONSULTATION:  Left lung mass positive on PET scan.   HISTORY OF PRESENT ILLNESS:  The patient is a 60 year old female who is  a known smoker for more than 30 years and quit in February of 2005.  In  September of 2005, she was seen for a right upper lung lesion after  incidentally being found following a passing out spell.  At that time, a  CT scan was done in [place] emergency room to rule out a pulmonary  embolus.  No embolus was noted, but she did have a 1-cm mass in the  right upper lobe, with a SUV of 3.9 suspicious for bronchogenic  carcinoma.  On November 12, 2003, she had a right upper lobectomy and node  resection and was found to have a T1, N2, moderately differentiated  adenocarcinoma with bronchoalveolar features.  She was treated with  chemotherapy postoperatively by Dr. Myna Hidalgo, with complaining four  cycles of carboplatinum and Gemzar.  She has been followed since by Dr.  Myna Hidalgo.  A followup CT scan on August 15, 2006 showed a new 6-mm x 6-mm  mass in the left upper lobe.  Subsequent CT scan on December 14, 2006  showed that it has increased in size to 9 x 8 mm.  A PET scan was  performed that low level activity with a maximum SUV of 1.5.  The  official report of this corrected.  The original report says the 9-mm  mass was in the right upper lobe when it was actually in the left upper  lobe.  Subsequent addendum was added to this report.  Because of the  patient's known previous malignancy and the fact there was  bronchoalveolar with  increasing size of the nodule and no other activity  to suggest metastatic disease, the patient was referred for  consideration of resection of the left lung lesion.   PAST MEDICAL HISTORY:  1. Hypertension.  2. Hyperlipidemia.  3. The patient denies diabetes.  4. She has a history of a previous left-sided paraplegia related to an      aneurysmal bleed which was surgically corrected by Dr. Jorge Mandril at      [facility] several years prior to the patient's previous      lobectomy.  She still complains of chronic pain in the left arm.      On CT and MRI of the neck, she was told that she had bulging      disks, but no surgical intervention was necessary.   FAMILY HISTORY:  Father died from emphysema.  Mother has diabetes.   SOCIAL HISTORY:  The patient lives with her husband.  She is no longer  smoking and quit 5 years ago.   MEDICATIONS:  1. Diovan 80 mg a day.  2. Nexium 40 mg a day.  3. Aspirin 81 mg a day.  4. Calcium.  5. Zomig 5 mg p.r.n.   ALLERGIES:  MEFOXIN, DEMEROL CAUSED HER TO PASS OUT, AVELOX HAD CAUSED  HER TO PASS OUT, AND TOPAMAX CAUSED SEIZURES.   REVIEW OF SYSTEMS:  CARDIAC:  The patient denies any definite chest  pain, resting shortness of breath, palpitations, orthopnea, presyncope.  She has had an episode of syncope, as noted above, more than 5 years  ago.  GENERAL:  She denies any fever, chills, or night sweats.  Denies  hemoptysis.  Has had no further syncopal episodes.  She does have  complaint of chronic left arm pain.  Denies easy bruisability.  Denies  claudication.   PHYSICAL EXAMINATION:  GENERAL:  The patient is awake, alert,  neurologically intact and able to relate her history without difficulty.  VITAL SIGNS:  Weight is 179 pounds, 5 feet 2 inches tall.  NECK:  She has no carotid bruits.  LUNGS:  Clear bilaterally without wheezing.  CARDIAC:  Regular rate and rhythm without murmur or gallop.  ABDOMEN:  Without palpable masses or  tenderness.  EXTREMITIES:  She has no pedal edema.  She has 2+ DP and PT pulses.   LABORATORY DATA:  Laboratory findings are pending.  Screening PFTs in  the office showed an FEV-1 of 1.8.  Full pulmonary function studies will  be obtained.  A CT scan of the chest and PET scan are reviewed, with the  addendums noted above.   IMPRESSION:  Possible second right lung primary in a patient with  previous history of T1, N2 adenocarcinoma with bronchoalveolar features  and a new appearance of a slowly growing lesion in the left lung.   I have discussed with the patient the findings on the serial CT scans  that the lesion was not previously present and now present and slowly  growing over a period of observation over the past 3-4 months and  indeterminate PET scan.  There is no other evidence of metastatic  disease on PET scan.  The patient has reasonable pulmonary function  studies and activity.  I agree with Dr. Myna Hidalgo that proceeding with  surgical resection with possibly a wedge resection and node sampling  versus formal lobectomy to preserve lung function is indicated.  The  risks and options are again reviewed with the patient.  She had  previously had right lobectomy and is aware of the postoperative course.  The risks of death, infection, stroke, myocardial infarction, bleeding,  and blood transfusion are all discussed with the patient and her husband  in detail.  We will hold her aspirin preoperatively and Diovan for 36  hours prior to surgery.  We tentatively plan to proceed with surgery on  Wednesday, January 10, 2007.      Sheliah Plane, MD  Electronically Signed     EG/MEDQ  D:  01/08/2007  T:  01/08/2007  Job:  [id number]   cc:   Rose Phi. Myna Hidalgo, M.D.  Tanvir A. Chodri, M.D.

## 2010-06-22 NOTE — Discharge Summary (Signed)
[name], MEDITZ NO.:  [id number]   MEDICAL RECORD NO.:  [id number]          PATIENT TYPE:  INP   LOCATION:  3303                         FACILITY:  [facility]   PHYSICIAN:  Sheliah Plane, MD    DATE OF BIRTH:  25-Mar-1950   DATE OF ADMISSION:  01/10/2007  DATE OF DISCHARGE:  01/15/2007                               DISCHARGE SUMMARY   ADDENDUM TO DISCHARGE SUMMARY:  By January 15, 2007, the patient's pain  was well controlled.  The patient's vital signs had been monitored  postoperatively and remained stable.  They were felt to be stable postop  by January 15, 2007.  She was afebrile.  She is sating greater than 90%  on room air.  The patient's pulmonary status continued to improve.  All  incisions were clean, dry and intact and healing well.  She was out of  bed ambulating well without difficulty.  She was tolerating diet well.  No nausea, vomiting noted.   The patient is felt ready for discharge home today January 15, 2007.   Follow-up appointment has been arranged with Dr. Tyrone Sage for January 25, 2007 at 10:30 a.m.  The patient will need to obtain PMI chest x-ray  30 minutes prior to this appointment.  Plan for the patient to have  chest tube sutures removed January 19, 2007, by her primary physician.  She will also need to contact her primary physician's office to schedule  an appointment to see her MD in the next 2-3 weeks.  This will be for  her liver panel follow-up.   DISCHARGE INSTRUCTIONS:  1. Activity:  Patient instructed no driving until released to do so,      no lifting over 10 pounds.  She is told to ambulate 34 times per      day progress tolerated, and to continue her breathing exercises.  2. Incisional care.  The patient was told to shower washing her      incisions using soap and water.  He is she is contact the office if      she develops any drainage or opening from any of her incision      sites.  3. Diet.  The patient educated on  diet to be low-fat, low-salt.   DISCHARGE MEDICATIONS:  1. Protonix 80 mg daily.  2. Diovan 80 mg daily.  3. Aspirin 81 mg daily.  4. Flomax 5 mg p.r.n..  5. Calcium and vitamin D daily.  6. Fish oil daily.  7. OxyIR 5 mg 1-2 tablets q. 4-6 hours p.r.n.  8. Fentanyl/Duragesic patch 25 mcg/hour change every 72 hours.      Theda Belfast, [place]      Sheliah Plane, MD  Electronically Signed    KMD/MEDQ  D:  01/15/2007  T:  01/15/2007  Job:  [id number]   cc:   Sheliah Plane, MD  Rose Phi. Myna Hidalgo, M.D.

## 2010-06-22 NOTE — Assessment & Plan Note (Signed)
OFFICE VISIT   [name], [name]  DOB:  [birth year]                                        July 26, 2007  CHART #:  [id number]   HISTORY OF PRESENT ILLNESS:  Ms. Bordenave returns to the office today in  followup after her bilateral thoracotomies and subsequent four cycles of  chemotherapy.  She originally presented with a T1, N1 adenocarcinoma  with bronchoalveolar features in 2005 and underwent a right upper  lobectomy, and then subsequently in December 2008 presented with a new  left lung mass and underwent wedge resection of left upper lobe and  lymph node dissection for adenocarcinoma with bronchoalveolar and  papillary features and negative nodes.  She is now approximately 2-1/2  months after completing her chemotherapy and doing reasonably well.  She  has had no hemoptysis.  She does have some radicular pain in her right  arm and neuropathic-type pain, especially, along the left thoracotomy  incision, but this thoracotomy incision pain seems to be improving.   PHYSICAL EXAMINATION:  VITAL SIGNS:  Her blood pressure is 130/90, pulse  is 76, respiratory rate 20, and O2 sats 95%.  HEENT:  I do not improve appreciate any cervical, supraclavicular, or  axillary adenopathy.  EXTREMITIES:  She has no pedal edema.   IMAGING STUDIES:  Recent CT scans of the head, chest, abdomen, and  pelvis were done by Dr. Myna Hidalgo, and there was no evidence of recurrent  disease noted.   PLAN:  The patient will continue to see Dr. Myna Hidalgo on a regular basis.  I have not made her a definite return appointment but would be glad to  see her at any time at her or Dr. Gustavo Lah request.   Sheliah Plane, MD  Electronically Signed   EG/MEDQ  D:  07/26/2007  T:  07/26/2007  Job:  [id number]   cc:   Rose Phi. Myna Hidalgo, M.D.

## 2010-06-22 NOTE — Assessment & Plan Note (Signed)
OFFICE VISIT   KENDALL, ARNELL  DOB:  Mar 04, 1950                                        January 25, 2007  CHART #:  [id number]   [name] returns to the office today in followup after her recent  bronchoscopy, left video-assisted thoracoscopy, mini thoracotomy, wedge  resection of the left upper lobe, and lymph node dissection done on  January 12, 2007.  The patient has been doing reasonably well at home,  though she still complains of muscle spasms across the left chest.  It  is relieved with pain medicine and is improving.  She has had no  definite shortness of breath or wheezing.  She is increasing her  physical activity appropriately.   PHYSICAL EXAMINATION:  Vital signs:  Her blood pressure is 123/81, pulse  is 82, respiratory rate is 18, O2 sat is 95%.  Her right thoracotomy  incision is well healed.  Her new left thoracotomy incision is also well  healed without evidence of infection.  She had no pedal edema or calf  tenderness.  Lungs:  Clear bilaterally.  She had no cervical or  supraclavicular adenopathy.   Followup chest x-ray shows clearing postoperative changes without  effusions or pneumothorax.   She was given a new prescription for oxycodone 5 mg, one p.o. q.4 h. as  needed for pain, #30 tablets and in between times will take ibuprofen.   I have told her not to start driving until she no longer needs the pain  medication.  She has an appointment in early January to see Dr. Myna Hidalgo  and will come back and see me in 6 weeks with a followup chest x-ray.   Sheliah Plane, MD  Electronically Signed   EG/MEDQ  D:  01/25/2007  T:  01/25/2007  Job:  [id number]   cc:   Rose Phi. Myna Hidalgo, M.D.

## 2010-06-22 NOTE — Discharge Summary (Signed)
NAMEMELADY, CHOW NO.:  [id number]   MEDICAL RECORD NO.:  [id number]          PATIENT TYPE:  INP   LOCATION:  1425                         FACILITY:  [facility]   PHYSICIAN:  Eduard Clos, MDDATE OF BIRTH:  [birth year]   DATE OF ADMISSION:  05/15/2007  DATE OF DISCHARGE:                               DISCHARGE SUMMARY   COURSE IN THE HOSPITAL:  A 60 year old female with known history of  adenocarcinoma of her lung who had recent chemotherapy, history of  hypertension, who was brought into the ER after the patient had a brief  episode of syncope as witnessed by her husband.  The patient was in the  bathroom, was trying to get up from the bathroom.  She asked help from  her husband and when her husband brought her out of the bathroom, she  suddenly lost her consciousness and her husband made her lie down flat  and she regained her consciousness, after which the patient did not have  any focal deficit, had full knowledge of all events pre and after that.  The patient had no seizure activity or incontinence of urine.  The  patient was brought into the ER and was admitted and IV fluids were  started.  At this time the patient is ambulating, tolerating a diet, had  no further episodes of syncope, and the patient is requesting to be  discharged as she is eager to go home and not wanting any further tests.  As the patient has been asymptomatic since admission, is ambulating, the  WBC count is in the higher state, which she states she has recently  gotten Neupogen from her chemotherapy, we will discharge the patient  home and I have advised the patient's husband that she will need 24/7  care until she sees her primary care doctor, for which her husband has  agreed.  I have advised the patient to follow with her primary care  doctor within 2 days and recheck a basic metabolic panel and CBC.   ASSESSMENT:  1. Syncope, probably vasovagal and secondary to dehydration.  2. Adenocarcinoma of lung.  3. History of hypertension, now mildly hypotensive.  4. Recent chemotherapy.  5. Leukocytosis.   DISCHARGE MEDICATIONS:  1. Protonix 40 mg p.o. daily.  2. Aspirin 81 mg p.o. daily.  3. Calcium with vitamin two tablets p.o. daily.  4. Frova, the patient takes it for migraine, and will be asked to take      it earlier.  5. Bactrim DS one tablet twice a day was taken for 5 days to complete      the full course as advised.  6. Tramadol 50 mg one to two tablets every 6 hours as needed.  7. Sertraline 10 mg p.o. at bedtime.   The patient advised to follow with her primary care physician within 2  days and recheck a basic metabolic panel and CBC.  At this time Diovan  is on hold and to restart Diovan when she follows with the primary care  once when the blood pressures are stable.  The patient is to  be on a  cardiac-healthy diet and to follow with the oncologist as scheduled.  The patient is discharged home with family.      Eduard Clos, MD  Electronically Signed     ANK/MEDQ  D:  05/16/2007  T:  05/16/2007  Job:  [id number]

## 2010-06-24 ENCOUNTER — Encounter (HOSPITAL_BASED_OUTPATIENT_CLINIC_OR_DEPARTMENT_OTHER): Payer: Medicare Other | Admitting: Hematology & Oncology

## 2010-06-24 DIAGNOSIS — C3410 Malignant neoplasm of upper lobe, unspecified bronchus or lung: Secondary | ICD-10-CM

## 2010-06-24 DIAGNOSIS — Z452 Encounter for adjustment and management of vascular access device: Secondary | ICD-10-CM

## 2010-06-25 NOTE — Consult Note (Signed)
NAMEEVEE, Kara NO.:  [id number]   MEDICAL RECORD NO.:  [id number]          PATIENT TYPE:  INP   LOCATION:  3302                         FACILITY:  [facility]   PHYSICIAN:  Rose Phi. Myna Hidalgo, M.D. DATE OF BIRTH:  1950/05/17   DATE OF CONSULTATION:  11/15/2003  DATE OF DISCHARGE:                                   CONSULTATION   REASON FOR CONSULTATION:  Stage II-B (T1 N2 M0) adenocarcinoma of the right  upper lobe.   HISTORY OF PRESENT ILLNESS:  [name] is a very nice 60 year old white  female with past history significant for hypertension.  She had been a  smoker for several years.  She currently does not smoke.   She apparently had an episode of loss of consciousness.  She was in the back  seat of a car when this happened.  There was no evidence of any seizure-like  activity.   She subsequently was found to have an aneurysmal bleed.  She underwent  surgery at [facility].   She was subsequently found to have a 1-cm mass on CT scan.  This was in the  right upper lobe.  She had no other abnormality.   A PET scan was subsequently done which showed activity in the right upper  lobe.  There was no evidence of activity in the mediastinum or elsewhere.   She was seen by Sheliah Plane, M.D.  Dr. Tyrone Sage felt that she would be a  good candidate for resection.   Of note, she did undergo a bronchoscopy down in [place].  Pathology showed  no obvious malignancy.   She was operated on by Dr. Tyrone Sage on November 12, 2003.  He went ahead and  did a right upper lobectomy.  The pathology reported ([id number]) revealed a  moderately-differentiated adenocarcinoma with bronchoalveolar features.  She  did have 1/2 mediastinal nodes positive for cancer.  She had 1/5  peribronchial nodes positive for adenocarcinoma.   Her postoperative recovery has been somewhat slow.  She has been slow to get  out of bed.  She is having some pain difficulties.  She did have a  chest  tube removed today.   She did have preoperative blood work done on November 10, 2003.  This showed a  white cell count of 8.5, hemoglobin 12.3, hematocrit 36.3, platelet count  269,000.  Pro time was 12.6 with a PTT of 30 seconds.  Chemistry studies  showed a sodium of 140, potassium 4.1, BUN 8, creatinine 0.7.  Calcium 9.5  with an albumin of 4.1.  The LFTs were within normal limits.   PAST MEDICAL HISTORY:  1.  Hypertension.  2.  Migraines.   ALLERGIES:  1.  DEMEROL.  2.  AVELOX.  3.  TOPAMAX.   ADMISSION MEDICATIONS:  1.  Diovan 80 mg daily.  2.  Nexium 40 mg daily.  3.  Ecotrin 81 mg daily.  4.  Zomig 5 mg daily.  5.  Ativan 0.5 mg daily.  6.  Darvocet as needed.   SOCIAL HISTORY:  This does show history of tobacco use.  She stopped several  years ago.  There is no history of alcohol use.   FAMILY HISTORY:  Remarkable for diabetes.   REVIEW OF SYSTEMS:  As in the History of Present Illness.   PHYSICAL EXAMINATION:  GENERAL:  This is a well-developed, well-nourished  white female in no obvious distress.  VITAL SIGNS:  Temperature 99, pulse 82, respiratory rate 22, blood pressure  100/60.  HEAD AND NECK EXAM:  Normocephalic, atraumatic skull.  She has no ocular or  oral lesions.  There are no palpable cervical or supraclavicular lymph  nodes.  LUNGS:  Decreased breath sounds bilaterally.  CARDIAC EXAM:  Regular rate and rhythm with a normal S1 and S2.  There are  no murmurs, rubs, or bruits.  ABDOMINAL EXAM:  Soft with good bowel sounds.  There is no palpable  abdominal mass.  There is no palpable hepatosplenomegaly.  BACK EXAM:  No tenderness over the spine, ribs, or hips.  EXTREMITIES:  No clubbing, cyanosis, or edema.  NEUROLOGICAL EXAM:  No focal neurological deficits.   LABORATORY:  As above.   IMPRESSION:  [name] is a 60 year old white female with Stage II-B non-  small cell lung cancer of the right upper lung.  She basically was found as   asymptomatic.   She certainly is a candidate for systemic chemotherapy.  She has a good  performance status before her surgery, and as such, I think she would  clearly benefit from adjuvant chemotherapy.   The risk of recurrence in my opinion is over 50%.   We can treat her with combination chemotherapy with carboplatin and  gemcitabine.   We will plan to start treatment in about 3-4 weeks.  She likely will need to  have a Port-A-Cath placed, but we can get this set up just prior to her  therapy.   We will get her back into the office a couple weeks after she is discharged  and talk to her more about the toxicity.   I do not think we need any other studies done at the present time.  I do not  think she needs a bone scan or a CT of the brain.       PRE/MEDQ  D:  11/15/2003  T:  11/15/2003  Job:  [id number]   cc:   Sheliah Plane, MD  [address]  [place]  [place] [address]   Paris Lore, M.D.   Keturah Barre, M.D.

## 2010-06-25 NOTE — Op Note (Signed)
NAMEJUSTIS, [name] NO.:  [id number]   MEDICAL RECORD NO.:  [id number]           PATIENT TYPE:   LOCATION:                               FACILITY:  [facility]   PHYSICIAN:  Sheliah Plane, MD    DATE OF BIRTH:  [birth year]   DATE OF PROCEDURE:  12/05/2003  DATE OF DISCHARGE:                                 OPERATIVE REPORT   PREOPERATIVE DIAGNOSIS:  Need for vascular access.   POSTOPERATIVE DIAGNOSIS:  Need for vascular access.   SURGICAL PROCEDURE:  Placement of left subclavian Port-A-Cath.   SURGEON:  Gwenith Daily. Tyrone Sage, M.D.   BRIEF HISTORY:  Several weeks prior, the patient had undergone pulmonary  resection, for carcinoma of the lung.  She has been evaluated by oncology,  and chemotherapy has been recommended.  Placement of Port-A-Cath was  recommended to the patient to assist in vascular access.  She agreed and  signed informed consent.   DESCRIPTION OF THE PROCEDURE:  With IV in place and sedated under MAC  anesthesia, the upper chest was prepped with Betadine and draped in the  usual sterile manner.  Lidocaine 1% was then infiltrated into the  infraclavicular area, and under fluoroscopic control, a guide wire was  placed into the superior vena cava.  A second counterincision was made over  the left anterior chest, and a subcutaneous pocket was made.  A pre-attached  Port-A-Cath reservoir was then selected and tunneled between the two  incisions.  A peel-away sheath and dilator was passed over the guide wire  under fluoroscopic control.  Through this, the Port-A-Cath tubing which had  been trimmed to the appropriate length was introduced into the superior vena  cava and confirmed fluoroscopically to be in the superior vena cava, at the  cavoatrial junction.  There was easy aspiration and flushing of the  catheter.  Concentrated heparin was then left instilled in the Port-A-Cath.  The two incisions were each closed with interrupted 3-0 Vicryl in the  subcutaneous tissue and a 4-0 subcuticular stitch in the skin edges.  Dry  dressings were applied.  Fluoroscopy, at the completion procedure confirmed  proper placement of the catheter and no evidence of left pneumothorax.  The  patient tolerated the procedure without obvious complication and was  transferred to the recovery room.      Edwa   EG/MEDQ  D:  12/07/2003  T:  12/07/2003  Job:  [id number]   cc:   [facility]

## 2010-06-25 NOTE — Discharge Summary (Signed)
Kara, [name] NO.:  [id number]   MEDICAL RECORD NO.:  [id number]          PATIENT TYPE:  INP   LOCATION:  2007                         FACILITY:  [facility]   PHYSICIAN:  Kara Plane, MD    DATE OF BIRTH:  02-May-1950   DATE OF ADMISSION:  11/12/2003  DATE OF DISCHARGE:  11/18/2003                                 DISCHARGE SUMMARY   HISTORY OF PRESENT ILLNESS:  The patient is a 60 year old female referred in  consultation to [name], M.D. with a very convoluted presentation of  a right lung mass.  The patient is a greater than 30-year smoker, however,  quit in February of 2005.  In mid-August after a mild upper respiratory  tract infection, she was treated with Avelox.  Several days after this while  riding in the back seat of her car, another passenger noted that she had a  sudden loss of consciousness and was unresponsive for four to five minutes.  They denied any obvious seizure-like activity.  The patient did not feel  warm or have nausea.  She does have a previous neurological history having  presented with left-sided paraplegia related to an aneurysmal bleed and  subsequently underwent neurosurgery by [name], M.D. at [facility], but the details of this are unknown.  The patient was noted not to  be on an antiseizure medications at the time of this most recent event.  She  was taken to the emergency room following this loss of consciousness and a  CT scan was done to rule out pulmonary embolism and none were noted.  There  was, however, finding of a 1 mm mass in the right upper lobe.  The patient  underwent bronchoscopy and the patient reported that this was negative.  A  PET scan was subsequently done at [facility] and this  showed a 9 x 12 mm right upper lobe mass with an SUV of 3.9 suspicious for  bronchogenic carcinoma.  Without evidence of any nodal metastasis or  metastatic disease of the chest or abdomen.   The patient was referred as  stated to [name] for surgical opinion and he recommended resection.   PAST MEDICAL HISTORY:  Hypertension, tobacco use/abuse.   MEDICATIONS:  1.  Diovan 80 mg daily.  2.  Nexium 40 mg daily.  3.  Ecotrin 81 mg daily.  4.  Zomig 5 mg daily.  5.  Calcium 1200 mg daily.  6.  Lorazepam for vertigo 0.5 mg daily.  7.  Propoxyphene 650 mg daily.   ALLERGIES:  NAFOXIDINE, DEMEROL  which caused her to pass out, and AVELOX  which caused her blood pressure to drop and pass out.   FAMILY HISTORY:   SOCIAL HISTORY:   REVIEW OF SYSTEMS:   PHYSICAL EXAMINATION:  Please see the history and physical done at the time  of admission.   HOSPITAL COURSE:  The patient was admitted electively and on November 12, 2003, taken to the operating room where she underwent right video-assisted  thoracoscopy with mini thoracotomy and right upper lobectomy  with lymph node  sampling.  The procedure was performed by [name].  Initial frozen  section revealed adenocarcinoma.   Postoperatively, the patient has done well.  She has followed a fairly  routine course.  She has maintained stable hemodynamics.  She did show a  somewhat slow improvement in activity level, but it was consistent.  Pathology revealed a stage 2B nonsmall cell lung carcinoma of the  adenocarcinoma type with bronchioalveolar features and some metastatic  positive nodes staging her at T1, N2, M0.  Brain scan was negative.  She was  seen in oncology consultation by [name], M.D. who recommended  adjuvant chemotherapy.  She overall was deemed to be adequately stable for  discharge on November 18, 2003.   DISCHARGE MEDICATIONS:  1.  Nexium 40 mg daily.  2.  Aspirin 81 mg daily.  3.  Zomig 5 mg daily.  4.  Diovan 80 mg daily.  5.  Lorazepam 0.5 mg daily for pain.  6.  Tylox one to two every four to six hours as needed.   DISCHARGE INSTRUCTIONS:  The patient received written instructions  regarding  medications, activity, diet, wound care, and follow-up.   FOLLOW UP:  [name] on November 3, at 11 a.m.  Dr. Myna [name] in three to  four weeks from discharge.   DISCHARGE DIAGNOSES:  1.  Lung carcinoma as described.  2.  Tobacco abuse.  3.  Hypertension.  4.  History of previous neurological aneurysmal bleed with surgical repair      and possible history of seizure disorder.   CONDITION ON DISCHARGE:  Stable and improved.      [name]   WEG/MEDQ  D:  12/31/2003  T:  01/01/2004  Job:  [id number]   cc:   [name], M.D.  [address] [facility]  [place], [place] [address]  Fax: [phone]   [name], M.D.

## 2010-06-25 NOTE — Op Note (Signed)
NAMEKASSIDY, DOCKENDORF NO.:  [id number]   MEDICAL RECORD NO.:  [id number]          PATIENT TYPE:  INP   LOCATION:  2309                         FACILITY:  [facility]   PHYSICIAN:  Sheliah Plane, MD    DATE OF BIRTH:  1950/09/20   DATE OF PROCEDURE:  11/12/2003  DATE OF DISCHARGE:                                 OPERATIVE REPORT   PREOPERATIVE DIAGNOSIS:  Right lung mass consistent with a carcinoma of the  lung.   POSTOPERATIVE DIAGNOSIS:  Adenocarcinoma of the right upper lobe by frozen  section.   PROCEDURE PERFORMED:  Right video-assisted thoracoscopy with mini-  thoracotomy, right upper lobectomy, and lymph node dissection.   SURGEON:  Sheliah Plane, MD   FIRST ASSISTANT:  Rowe Clack, P.A.-C.   BRIEF HISTORY:  The patient is a 60 year old female who after a syncopal  episode while riding in the back seat of a car had a CT scan done at  [facility].  There was no evidence of pulmonary embolus; however, an  incidental 1.3 cm spiculated mass was noted in the right lung in the right  upper lobe, suggestive of a carcinoma.  PET scan showed no evidence of  mediastinal involvement and again was consistent with a lung mass.  Surgical  resection was recommended.  The patient agreed and signed informed consent.   DESCRIPTION OF PROCEDURE:  With central line and arterial line in place, the  patient underwent general endotracheal anesthesia with a double-lumen  endotracheal tube.  Then she was turned to the right lateral decubitus  position with the right side up.  A thoracoscope was introduced into the  chest and the chest explored.  The lesion was not readily evident on the  surface of the lung.  There was no evidence of pericardial or pleural  spread.  A small thoracotomy was then created to allow palpation of the  lung.  The mass was located, and wedge resection of this area was performed  through a small incision and through an accessory port incision.   Frozen  section revealed adenocarcinoma of the lung.  The patient had complete  fissures.  The hilum was dissected out.  Pulmonary veins were divided with  vascular staples, as were two pulmonary artery branches to the upper lobe.  The bronchus was identified. A TL-30 stapler was used to staple the bronchus  and excise the mass, was removed.  Areas of 4 and 10R node stations were  resected and submitted separately to pathology.  The bronchial stump was  tested and was free of air leak.  Two 28 chest tubes were placed into the  chest, On-Q pain management catheters were placed in the subcutaneous tissue  and into the chest along the intercostal nerves.  The rib was reapproximated  with a single 0  Dexon suture.  Interrupted 0 Vicryls were in the deep fascia, a 4-0  subcuticular stitch in the skin.  A dry dressing was applied.  The patient  was awakened and extubated in the operating room, having tolerated the  procedure without obvious complication.  Edwa   EG/MEDQ  D:  11/13/2003  T:  11/13/2003  Job:  [id number]   cc:   Rose Phi. Myna Hidalgo, M.D.  [address] [facility]  [place], [place] [address]  Fax: [phone]

## 2010-06-25 NOTE — Op Note (Signed)
NAMECHRISIE, [name]                ACCOUNT NO.:  [id number]   MEDICAL RECORD NO.:  [id number]          PATIENT TYPE:  AMB   LOCATION:  SDS                          FACILITY:  [facility]   PHYSICIAN:  Sheliah Plane, MD    DATE OF BIRTH:  11-29-1950   DATE OF PROCEDURE:  04/05/2005  DATE OF DISCHARGE:                                 OPERATIVE REPORT   PREOPERATIVE DIAGNOSIS:  Porta-Cath.   POSTOPERATIVE DIAGNOSIS:  Porta-Cath.   SURGICAL PROCEDURE:  Removal of Porta-Cath.   SURGEON:  Sheliah Plane, M.D.   BRIEF HISTORY:  The patient is a 60 year old female with a previous history  of right lung resection for carcinoma of the lung with subsequent  chemotherapy.  She is no longer undergoing chemotherapy treatment and has  had mild discomfort in her left shoulder.  In discussion with the  oncologist, it is felt that he no longer needed a Porta-Cath and elective  removal was recommended.  The patient agreed and signed the informed  consent.   DESCRIPTION OF PROCEDURE:  With light intravenous sedation, the patient was  prepped with Betadine and draped in the usual sterile manner over the left  upper chest.  Lidocaine 1% was infiltrated locally.  Incision was made  through the old incision and dissection carried down to the capsule around  the Porta-Cath.  There was no evidence of infection.  The Porta-Cath was  easily removed and #3 Vicryl sutures were placed in the subcutaneous  tissues, and a 4-0 subcuticular stitch in the skin edges.  Steri-Strips were  applied.  A dry dressing was applied and the patient was transferred to the  recovery room for postoperative observation.  She tolerated the procedure  without obvious complication.  Sponge and needle count was reported as  correct.  Blood loss was minimal.      Sheliah Plane, MD  Electronically Signed     EG/MEDQ  D:  04/05/2005  T:  04/05/2005  Job:  [id number]   cc:   Rose Phi. Myna Hidalgo, M.D.  Fax: [phone]

## 2010-07-12 ENCOUNTER — Ambulatory Visit
Admission: RE | Admit: 2010-07-12 | Discharge: 2010-07-12 | Disposition: A | Discharge status: Home / Self Care | Payer: Medicare Other | Source: Ambulatory Visit | Attending: Hematology & Oncology | Admitting: Hematology & Oncology

## 2010-07-12 DIAGNOSIS — C3410 Malignant neoplasm of upper lobe, unspecified bronchus or lung: Secondary | ICD-10-CM

## 2010-07-12 MED ORDER — IOHEXOL 300 MG/ML  SOLN
75.0000 mL | Freq: Once | INTRAMUSCULAR | Status: AC | PRN
  Administered 2010-07-12: 75 mL via INTRAVENOUS

## 2010-07-13 ENCOUNTER — Other Ambulatory Visit: Payer: Managed Care, Other (non HMO)

## 2010-07-16 ENCOUNTER — Encounter (HOSPITAL_BASED_OUTPATIENT_CLINIC_OR_DEPARTMENT_OTHER): Payer: Managed Care, Other (non HMO) | Admitting: Hematology & Oncology

## 2010-07-16 ENCOUNTER — Other Ambulatory Visit: Payer: Self-pay | Admitting: Hematology & Oncology

## 2010-07-16 DIAGNOSIS — C3410 Malignant neoplasm of upper lobe, unspecified bronchus or lung: Secondary | ICD-10-CM

## 2010-07-16 LAB — CBC WITH DIFFERENTIAL (CANCER CENTER ONLY)
BASO#: 0.1 10*3/uL (ref 0.0–0.2)
Eosinophils Absolute: 0.1 10*3/uL (ref 0.0–0.5)
HCT: 36.9 % (ref 34.8–46.6)
HGB: 12.3 g/dL (ref 11.6–15.9)
LYMPH%: 23.6 % (ref 14.0–48.0)
MCH: 27.3 pg (ref 26.0–34.0)
MCV: 82 fL (ref 81–101)
MONO#: 0.4 10*3/uL (ref 0.1–0.9)
NEUT%: 65.7 % (ref 39.6–80.0)
Platelets: 199 10*3/uL (ref 145–400)
RBC: 4.51 10*6/uL (ref 3.70–5.32)
WBC: 5.9 10*3/uL (ref 3.9–10.0)

## 2010-07-16 LAB — COMPREHENSIVE METABOLIC PANEL
BUN: 17 mg/dL (ref 6–23)
CO2: 24 mEq/L (ref 19–32)
Glucose, Bld: 85 mg/dL (ref 70–99)
Sodium: 140 mEq/L (ref 135–145)
Total Bilirubin: 0.4 mg/dL (ref 0.3–1.2)
Total Protein: 7.4 g/dL (ref 6.0–8.3)

## 2010-07-16 LAB — VITAMIN D 25 HYDROXY (VIT D DEFICIENCY, FRACTURES): Vit D, 25-Hydroxy: 48 ng/mL (ref 30–89)

## 2010-08-04 ENCOUNTER — Encounter (HOSPITAL_BASED_OUTPATIENT_CLINIC_OR_DEPARTMENT_OTHER): Payer: Medicare Other | Admitting: Hematology & Oncology

## 2010-08-04 DIAGNOSIS — C3410 Malignant neoplasm of upper lobe, unspecified bronchus or lung: Secondary | ICD-10-CM

## 2010-08-04 DIAGNOSIS — Z452 Encounter for adjustment and management of vascular access device: Secondary | ICD-10-CM

## 2010-09-15 ENCOUNTER — Other Ambulatory Visit: Payer: Self-pay | Admitting: Hematology & Oncology

## 2010-09-15 ENCOUNTER — Ambulatory Visit (HOSPITAL_BASED_OUTPATIENT_CLINIC_OR_DEPARTMENT_OTHER)
Admission: RE | Admit: 2010-09-15 | Discharge: 2010-09-15 | Disposition: A | Discharge status: Home / Self Care | Payer: Medicare Other | Source: Ambulatory Visit | Attending: Hematology & Oncology | Admitting: Hematology & Oncology

## 2010-09-15 ENCOUNTER — Encounter (HOSPITAL_BASED_OUTPATIENT_CLINIC_OR_DEPARTMENT_OTHER): Payer: Managed Care, Other (non HMO) | Admitting: Hematology & Oncology

## 2010-09-15 DIAGNOSIS — Z452 Encounter for adjustment and management of vascular access device: Secondary | ICD-10-CM

## 2010-09-15 DIAGNOSIS — Z5111 Encounter for antineoplastic chemotherapy: Secondary | ICD-10-CM

## 2010-09-15 DIAGNOSIS — M25519 Pain in unspecified shoulder: Secondary | ICD-10-CM

## 2010-09-15 DIAGNOSIS — C3410 Malignant neoplasm of upper lobe, unspecified bronchus or lung: Secondary | ICD-10-CM

## 2010-09-15 DIAGNOSIS — Z85118 Personal history of other malignant neoplasm of bronchus and lung: Secondary | ICD-10-CM

## 2010-09-15 DIAGNOSIS — C3490 Malignant neoplasm of unspecified part of unspecified bronchus or lung: Secondary | ICD-10-CM

## 2010-09-15 DIAGNOSIS — R52 Pain, unspecified: Secondary | ICD-10-CM

## 2010-09-15 LAB — CBC WITH DIFFERENTIAL (CANCER CENTER ONLY)
BASO%: 0.7 % (ref 0.0–2.0)
HCT: 35.5 % (ref 34.8–46.6)
LYMPH#: 1.5 10*3/uL (ref 0.9–3.3)
MONO#: 0.4 10*3/uL (ref 0.1–0.9)
Platelets: 194 10*3/uL (ref 145–400)
RBC: 4.3 10*6/uL (ref 3.70–5.32)
RDW: 15.9 % — ABNORMAL HIGH (ref 11.1–15.7)
WBC: 6 10*3/uL (ref 3.9–10.0)

## 2010-09-16 LAB — COMPREHENSIVE METABOLIC PANEL
ALT: 10 U/L (ref 0–35)
Albumin: 4.2 g/dL (ref 3.5–5.2)
CO2: 23 mEq/L (ref 19–32)
Calcium: 9 mg/dL (ref 8.4–10.5)
Chloride: 103 mEq/L (ref 96–112)
Sodium: 139 mEq/L (ref 135–145)
Total Protein: 6.8 g/dL (ref 6.0–8.3)

## 2010-09-16 LAB — LACTATE DEHYDROGENASE: LDH: 163 U/L (ref 94–250)

## 2010-10-02 IMAGING — CT CT ABDOMEN W/ CM
2 of 5 series · 15 of 46 positions shown, 17 images · IV contrast (CONTRAST)
Comparison: CT of the chest and abdomen of 06/05/2007.

CLINICAL DATA: History of lung carcinoma with initial right upper
lobectomy surgery in 3994 , recurrence in the left upper lobe with
a wedge resection and now following slow growing nodule in right
lower lobe which has been treated with radiation therapy.

CT CHEST AND ABDOMEN WITH CONTRAST
TECHNIQUE: Multidetector CT imaging of the chest and abdomen was
performed following the standard protocol during bolus
administration of intravenous contrast.
Contrast: 100 ml [id number]-8CC

[Series 2: w/ · axial · 0.68mm/px · z∈[+1232,+1582]mm · 12 of 82 slices shown, 14 images]
[im 6/82  soft-tissue]
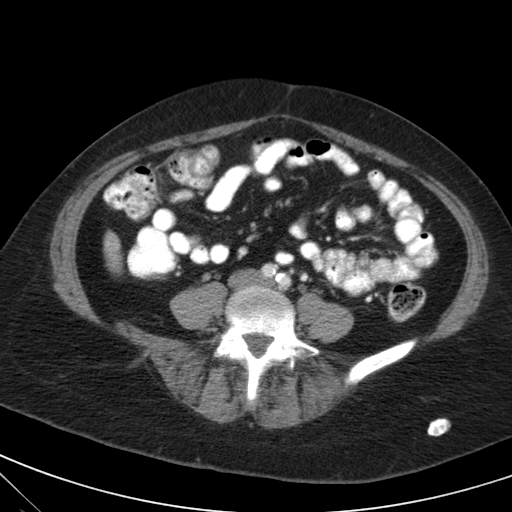
[im 6/82  bone]
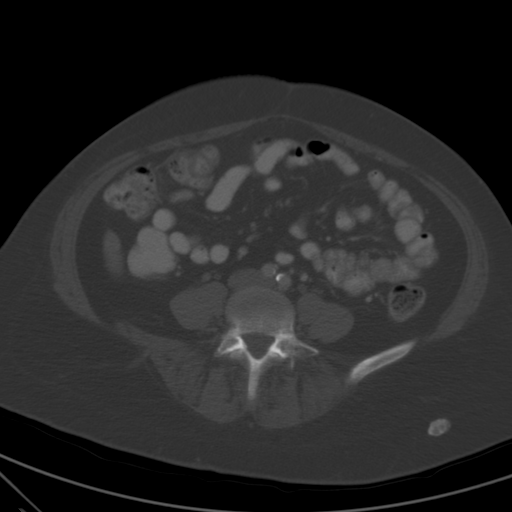
[im 12/82  soft-tissue]
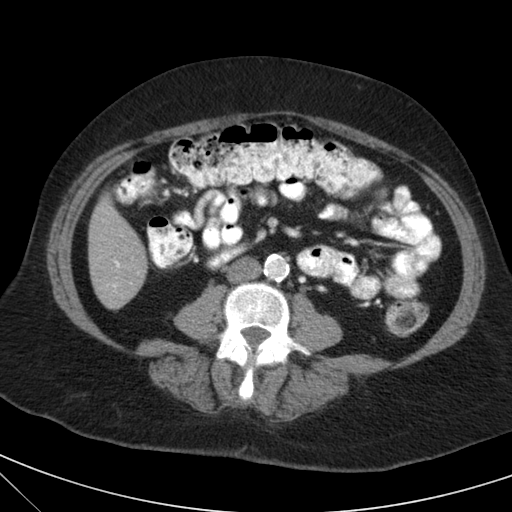
[im 18/82  soft-tissue]
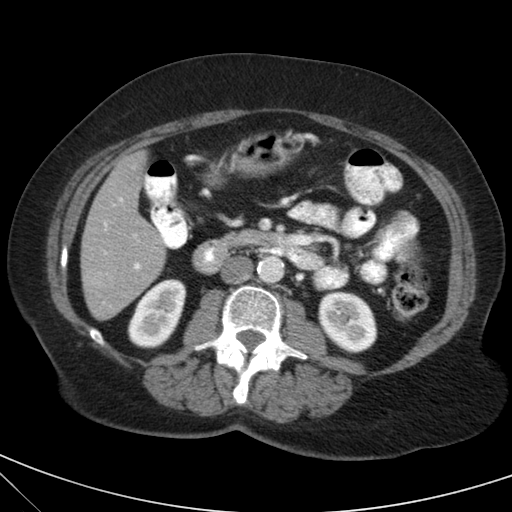
[im 24/82  soft-tissue]
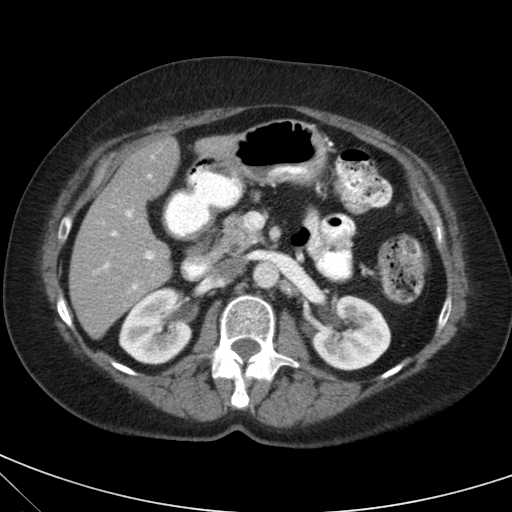
[im 29/82  soft-tissue]
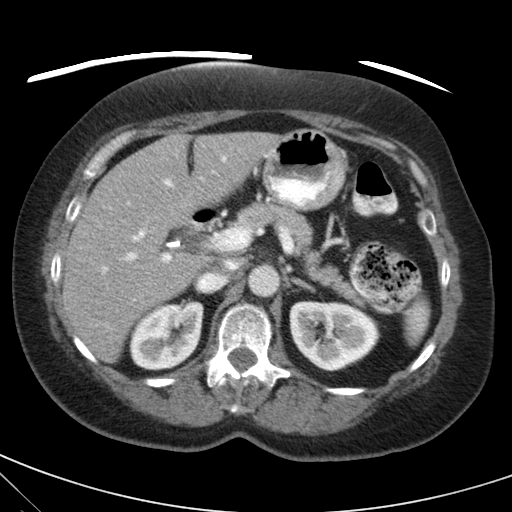
[im 35/82  soft-tissue]
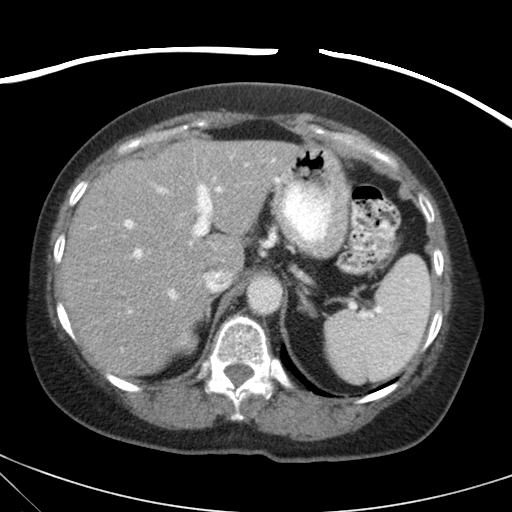
[im 47/82  soft-tissue]
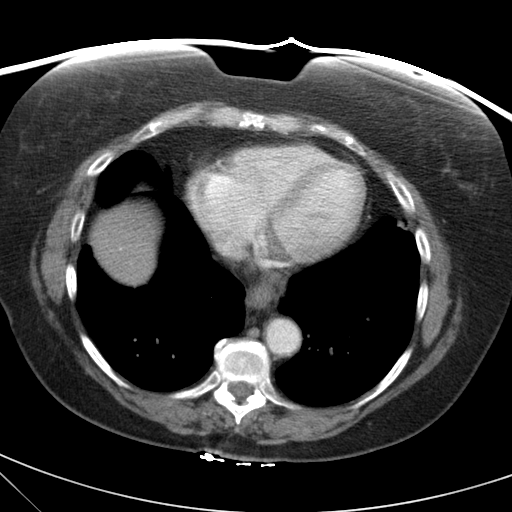
[im 53/82  soft-tissue]
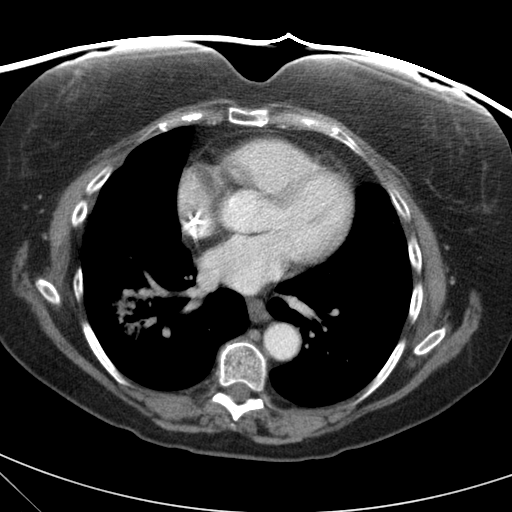
[im 58/82  soft-tissue]
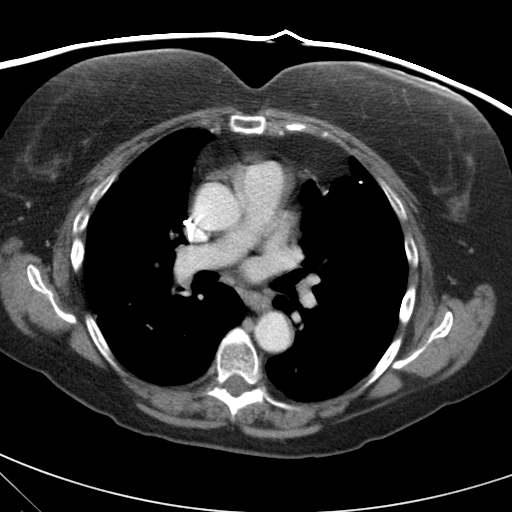
[im 58/82  bone]
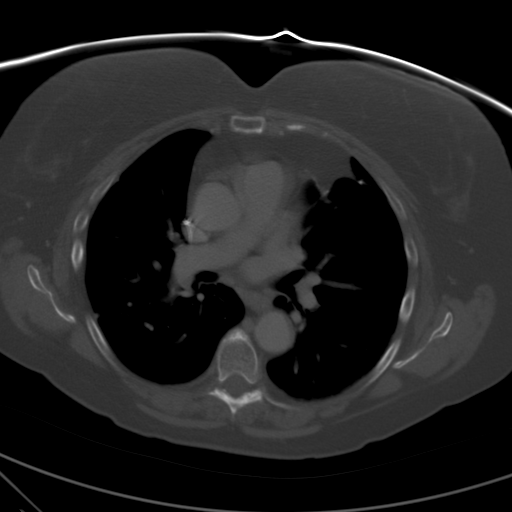
[im 64/82  soft-tissue]
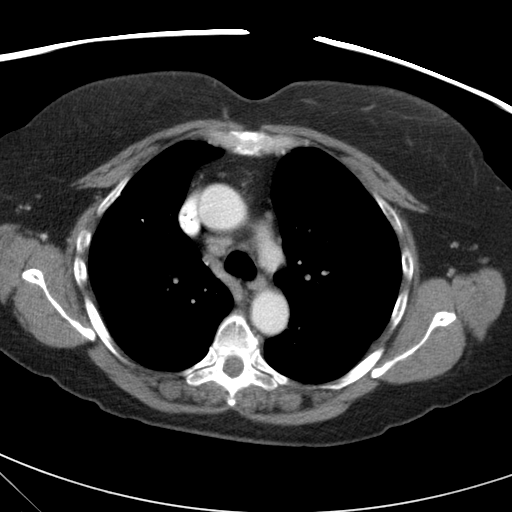
[im 70/82  soft-tissue]
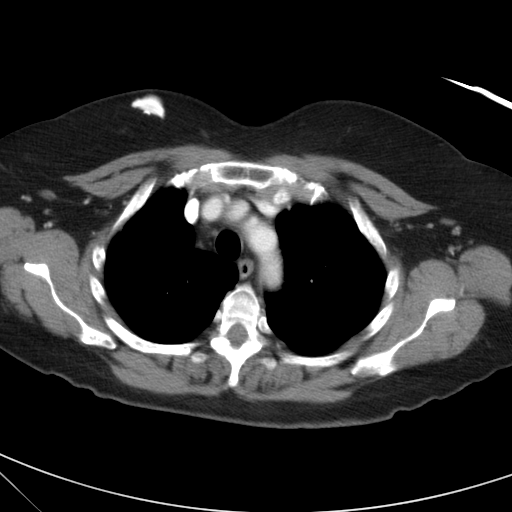
[im 76/82  soft-tissue]
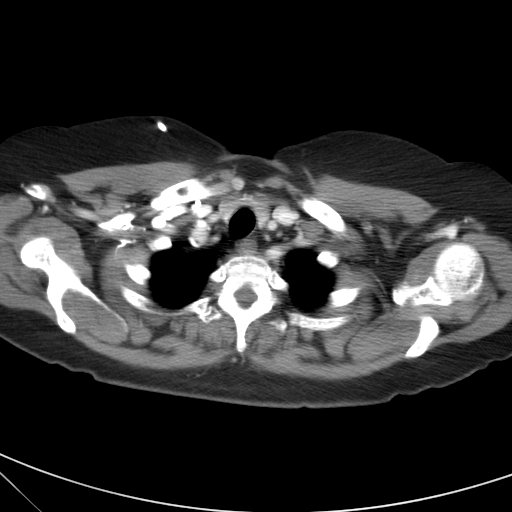

[coronals · coronal · 0.79mm/px · 3 of 70 slices shown]
[im 24/70  soft-tissue]
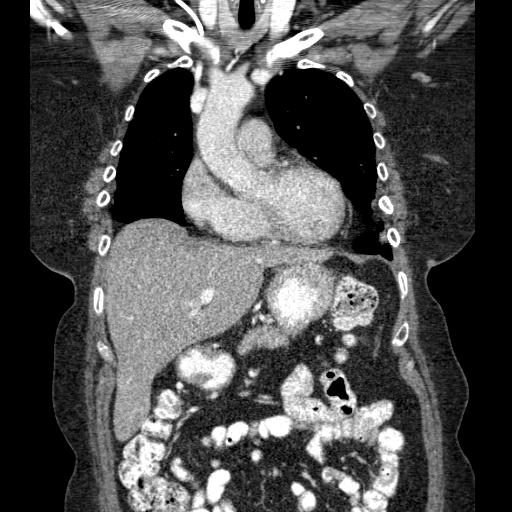
[im 31/70  soft-tissue]
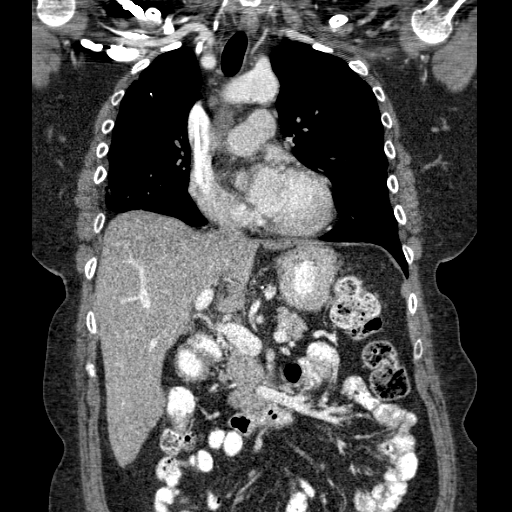
[im 39/70  soft-tissue]
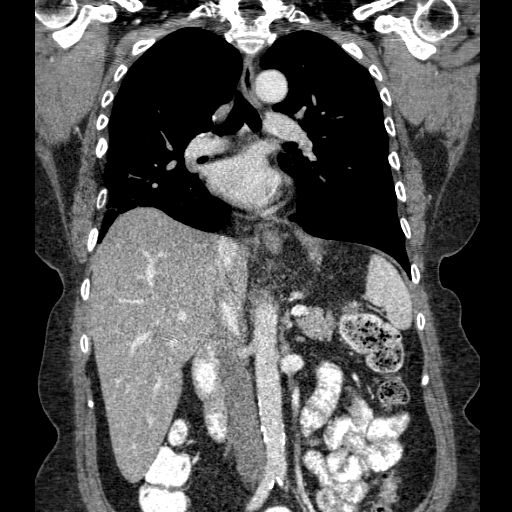

[15 of 46 positions shown; findings below may reference images not displayed]

Also
dictated report from [facility]RED[facility]ED[facility]OS[facility]]
of the CT of the chest and abdomen dated 08/01/2008

CT CHEST
FINDINGS: The prior study from the [REDACTED] [HOSPITAL] is
not currently available for comparison.  However, within the right
lower lobe there is an irregularly marginated parenchymal opacity
of 34 x 32 mm on axial image number 26 series 5.  This may be at
the site of the previously demonstrated right lower lobe lung
nodule. Although there are air bronchograms coursing through this
opacity, considerations are that of pneumonia versus neoplasm.
This opacity extends to abut the right hemidiaphragm.

The parenchymal opacity in the anteromedial right upper lung field
has resolved.  There is a small nodular opacity in the periphery of
the lingula subpleural in location measuring 8 x 9 mm and attention
to this area on follow-up scan is recommended.  No pleural effusion
is seen.  Mild emphysematous change again is noted throughout the
lung fields.

On soft tissue window images there are minimally more prominent
nodes precarinal.  These are not pathologically enlarged but the
fact that a few have increased in size is worrisome.  One of the
larger nodes measures 8 x 12 mm, measuring no more than 6 mm on the
prior study.  The pulmonary arteries and thoracic aorta opacify
with no significant abnormality noted.
IMPRESSION: 1.  There is now an irregularly marginated parenchymal opacity
within the right lower lobe with air bronchograms present.  The
previously noted right lower lobe lung nodule is not definitely
seen.  Although this opacity could be inflammatory in nature,
recurrence of malignancy cannot be excluded.
2.  8 x 9 mm subpleural nodule in the lingula.  Recommend attention
to this nodule on follow-up scan.
3.  Slightly more prominent precarinal nodes which are not
pathologically enlarged.

CT ABDOMEN
FINDINGS: The previous noted hiatal hernia has reduced somewhat in
the interval.  The liver is rather low in attenuation suggesting
fatty infiltration.   Within the caudal aspect of the right lobe of
liver there is a vague area of increased attenuation.  This may
represent normal liver parenchyma surrounded by fatty infiltration,
but follow-up is recommended.  No ductal dilatation is seen.
Surgical clips are present from prior cholecystectomy.  The
pancreas is normal in size and the pancreatic duct is not dilated.
The adrenal glands and spleen appear normal.  The kidneys enhance
with no significant abnormality noted.  The aorta is normal in
caliber.  No adenopathy is seen.  The stomach is unremarkable.
IMPRESSION: 1.  No evidence of metastatic involvement.  There is a vague area
of increased attenuation in the caudal aspect of the right lobe of
liver and follow-up is recommended to assess stability.
2.  Reduction of previously noted hiatal hernia.

## 2010-10-06 ENCOUNTER — Ambulatory Visit
Admission: RE | Admit: 2010-10-06 | Discharge: 2010-10-06 | Disposition: A | Discharge status: Home / Self Care | Payer: Managed Care, Other (non HMO) | Source: Ambulatory Visit | Attending: Hematology & Oncology | Admitting: Hematology & Oncology

## 2010-10-06 DIAGNOSIS — C3490 Malignant neoplasm of unspecified part of unspecified bronchus or lung: Secondary | ICD-10-CM

## 2010-10-06 MED ORDER — IOHEXOL 300 MG/ML  SOLN
75.0000 mL | Freq: Once | INTRAMUSCULAR | Status: AC | PRN
  Administered 2010-10-06: 75 mL via INTRAVENOUS

## 2010-10-27 ENCOUNTER — Encounter (HOSPITAL_BASED_OUTPATIENT_CLINIC_OR_DEPARTMENT_OTHER): Payer: Medicare Other | Admitting: Hematology & Oncology

## 2010-10-27 ENCOUNTER — Other Ambulatory Visit: Payer: Self-pay | Admitting: Hematology & Oncology

## 2010-10-27 DIAGNOSIS — R52 Pain, unspecified: Secondary | ICD-10-CM

## 2010-10-27 DIAGNOSIS — C3410 Malignant neoplasm of upper lobe, unspecified bronchus or lung: Secondary | ICD-10-CM

## 2010-10-27 DIAGNOSIS — E559 Vitamin D deficiency, unspecified: Secondary | ICD-10-CM

## 2010-10-27 DIAGNOSIS — Z452 Encounter for adjustment and management of vascular access device: Secondary | ICD-10-CM

## 2010-10-27 LAB — CBC WITH DIFFERENTIAL (CANCER CENTER ONLY)
BASO#: 0.1 10*3/uL (ref 0.0–0.2)
Eosinophils Absolute: 0.1 10*3/uL (ref 0.0–0.5)
HCT: 35 % (ref 34.8–46.6)
HGB: 12 g/dL (ref 11.6–15.9)
LYMPH#: 1.7 10*3/uL (ref 0.9–3.3)
MCHC: 34.3 g/dL (ref 32.0–36.0)
MONO#: 0.5 10*3/uL (ref 0.1–0.9)
NEUT#: 3.9 10*3/uL (ref 1.5–6.5)
NEUT%: 63.3 % (ref 39.6–80.0)
RBC: 4.3 10*6/uL (ref 3.70–5.32)
WBC: 6.2 10*3/uL (ref 3.9–10.0)

## 2010-10-28 LAB — COMPREHENSIVE METABOLIC PANEL
Albumin: 4.3 g/dL (ref 3.5–5.2)
BUN: 15 mg/dL (ref 6–23)
CO2: 23 mEq/L (ref 19–32)
Calcium: 9.1 mg/dL (ref 8.4–10.5)
Chloride: 105 mEq/L (ref 96–112)
Creatinine, Ser: 0.55 mg/dL (ref 0.50–1.10)
Glucose, Bld: 85 mg/dL (ref 70–99)
Potassium: 3.9 mEq/L (ref 3.5–5.3)

## 2010-10-28 LAB — LACTATE DEHYDROGENASE: LDH: 170 U/L (ref 94–250)

## 2010-11-02 LAB — AMYLASE: Amylase: 38

## 2010-11-02 LAB — COMPREHENSIVE METABOLIC PANEL
AST: 16
BUN: 7
CO2: 24
Chloride: 106
Creatinine, Ser: 0.66
GFR calc Af Amer: >60
GFR calc non Af Amer: >60
Glucose, Bld: 91
Total Bilirubin: 0.5

## 2010-11-02 LAB — CBC
HCT: 35.5 — ABNORMAL LOW
Hemoglobin: 10.9 — ABNORMAL LOW
MCV: 81
Platelets: 338
RDW: 19.7 — ABNORMAL HIGH
RDW: 20.2 — ABNORMAL HIGH
WBC: 15.1 — ABNORMAL HIGH
WBC: 22 — ABNORMAL HIGH

## 2010-11-02 LAB — URINALYSIS, ROUTINE W REFLEX MICROSCOPIC
Bilirubin Urine: NEGATIVE
Ketones, ur: NEGATIVE
Nitrite: NEGATIVE
Protein, ur: NEGATIVE
Urobilinogen, UA: 0.2

## 2010-11-02 LAB — URINALYSIS, MICROSCOPIC ONLY
Bilirubin Urine: NEGATIVE
Hgb urine dipstick: NEGATIVE
Ketones, ur: NEGATIVE
Nitrite: NEGATIVE
Protein, ur: NEGATIVE
Urobilinogen, UA: 0.2

## 2010-11-02 LAB — BASIC METABOLIC PANEL
BUN: 20
Creatinine, Ser: 0.83
GFR calc non Af Amer: >60
Glucose, Bld: 136 — ABNORMAL HIGH
Potassium: 4.1

## 2010-11-02 LAB — DIFFERENTIAL
Basophils Absolute: 0.2 — ABNORMAL HIGH
Eosinophils Relative: 3
Lymphs Abs: 3.6
Monocytes Absolute: 1.1 — ABNORMAL HIGH
Neutrophils Relative %: 65

## 2010-11-02 LAB — LIPASE, BLOOD: Lipase: 23

## 2010-11-15 LAB — BASIC METABOLIC PANEL
BUN: 10
BUN: 3 — ABNORMAL LOW
BUN: 5 — ABNORMAL LOW
CO2: 26
CO2: 27
CO2: 28
Calcium: 8 — ABNORMAL LOW
Calcium: 8.7
Calcium: 8.9
Chloride: 101
Chloride: 102
Chloride: 105
Creatinine, Ser: 0.56
Creatinine, Ser: 0.62
Creatinine, Ser: 0.65
GFR calc Af Amer: >60
GFR calc Af Amer: >60
GFR calc Af Amer: >60
GFR calc non Af Amer: >60
GFR calc non Af Amer: >60
GFR calc non Af Amer: >60
Glucose, Bld: 112 — ABNORMAL HIGH
Glucose, Bld: 117 — ABNORMAL HIGH
Glucose, Bld: 147 — ABNORMAL HIGH
Potassium: 3.8
Potassium: 3.9
Potassium: 3.9
Sodium: 135
Sodium: 137
Sodium: 138

## 2010-11-15 LAB — CBC
HCT: 31.7 — ABNORMAL LOW
HCT: 32.8 — ABNORMAL LOW
HCT: 32.9 — ABNORMAL LOW
HCT: 35.9 — ABNORMAL LOW
Hemoglobin: 10.7 — ABNORMAL LOW
Hemoglobin: 11.1 — ABNORMAL LOW
Hemoglobin: 11.2 — ABNORMAL LOW
Hemoglobin: 11.9 — ABNORMAL LOW
MCHC: 33.2
MCHC: 33.8
MCHC: 33.8
MCHC: 34.2
MCV: 80.9
MCV: 81.2
MCV: 82.1
MCV: 82.1
Platelets: 199
Platelets: 216
Platelets: 237
Platelets: 242
RBC: 3.91
RBC: 4.01
RBC: 4.06
RBC: 4.37
RDW: 15.5
RDW: 15.9 — ABNORMAL HIGH
RDW: 16 — ABNORMAL HIGH
RDW: 16.4 — ABNORMAL HIGH
WBC: 10.8 — ABNORMAL HIGH
WBC: 12.4 — ABNORMAL HIGH
WBC: 9.1
WBC: 9.7

## 2010-11-15 LAB — COMPREHENSIVE METABOLIC PANEL
ALT: 19
ALT: 688 — ABNORMAL HIGH
AST: 19
AST: 394 — ABNORMAL HIGH
Albumin: 3.3 — ABNORMAL LOW
Albumin: 4
Alkaline Phosphatase: 152 — ABNORMAL HIGH
Alkaline Phosphatase: 65
BUN: 11
BUN: 3 — ABNORMAL LOW
CO2: 27
CO2: 28
Calcium: 8.1 — ABNORMAL LOW
Calcium: 9.1
Chloride: 100
Chloride: 103
Creatinine, Ser: 0.48
Creatinine, Ser: 0.7
GFR calc Af Amer: >60
GFR calc Af Amer: >60
GFR calc non Af Amer: >60
GFR calc non Af Amer: >60
Glucose, Bld: 118 — ABNORMAL HIGH
Glucose, Bld: 93
Potassium: 3.6
Potassium: 3.6
Sodium: 137
Sodium: 138
Total Bilirubin: 0.5
Total Bilirubin: 1
Total Protein: 6.3
Total Protein: 6.9

## 2010-11-15 LAB — POCT I-STAT 3, ART BLOOD GAS (G3+)
Acid-base deficit: 1
Bicarbonate: 25.1 — ABNORMAL HIGH
O2 Saturation: 96
Operator id: 265961
Patient temperature: 98.1
TCO2: 26
pCO2 arterial: 43.6
pH, Arterial: 7.367
pO2, Arterial: 88

## 2010-11-15 LAB — HEPATIC FUNCTION PANEL
ALT: 197 — ABNORMAL HIGH
ALT: 257 — ABNORMAL HIGH
ALT: 597 — ABNORMAL HIGH
AST: 323 — ABNORMAL HIGH
AST: 52 — ABNORMAL HIGH
AST: 55 — ABNORMAL HIGH
Albumin: 2.9 — ABNORMAL LOW
Albumin: 3.1 — ABNORMAL LOW
Albumin: 3.2 — ABNORMAL LOW
Alkaline Phosphatase: 140 — ABNORMAL HIGH
Alkaline Phosphatase: 144 — ABNORMAL HIGH
Bilirubin, Direct: 0.2
Bilirubin, Direct: 0.2
Bilirubin, Direct: 0.3
Indirect Bilirubin: 0.6
Indirect Bilirubin: 0.7
Total Bilirubin: 0.7
Total Bilirubin: 0.9
Total Bilirubin: 0.9
Total Protein: 6.1
Total Protein: 7.2

## 2010-11-15 LAB — URINALYSIS, MICROSCOPIC ONLY
Bilirubin Urine: NEGATIVE
Glucose, UA: NEGATIVE
Hgb urine dipstick: NEGATIVE
Ketones, ur: NEGATIVE
Nitrite: NEGATIVE
Protein, ur: NEGATIVE
Specific Gravity, Urine: 1.017
Urobilinogen, UA: 0.2
pH: 7.5

## 2010-11-15 LAB — URINALYSIS, ROUTINE W REFLEX MICROSCOPIC
Bilirubin Urine: NEGATIVE
Glucose, UA: NEGATIVE
Hgb urine dipstick: NEGATIVE
Ketones, ur: NEGATIVE
Nitrite: NEGATIVE
Protein, ur: NEGATIVE
Specific Gravity, Urine: 1.022
Urobilinogen, UA: 0.2
pH: 6.5

## 2010-11-15 LAB — URINE MICROSCOPIC-ADD ON

## 2010-11-15 LAB — PROTIME-INR
INR: 1
Prothrombin Time: 12.9

## 2010-11-15 LAB — BLOOD GAS, ARTERIAL
Acid-base deficit: 0.3
Bicarbonate: 22.9
Drawn by: 122601
FIO2: 0.21
O2 Saturation: 97.6
Patient temperature: 98.6
TCO2: 23.8
pCO2 arterial: 31.1 — ABNORMAL LOW
pH, Arterial: 7.479 — ABNORMAL HIGH
pO2, Arterial: 90.5

## 2010-11-15 LAB — URINE CULTURE
Colony Count: NO GROWTH
Culture: NO GROWTH
Special Requests: NEGATIVE

## 2010-11-15 LAB — TYPE AND SCREEN
ABO/RH(D): A POS
Antibody Screen: NEGATIVE

## 2010-11-15 LAB — AMYLASE: Amylase: 125

## 2010-11-15 LAB — LIPASE, BLOOD: Lipase: 79 — ABNORMAL HIGH

## 2010-11-15 LAB — APTT: aPTT: 30

## 2010-11-15 LAB — ABO/RH: ABO/RH(D): A POS

## 2010-11-27 ENCOUNTER — Other Ambulatory Visit: Payer: Self-pay | Admitting: Family

## 2010-11-27 DIAGNOSIS — C3490 Malignant neoplasm of unspecified part of unspecified bronchus or lung: Secondary | ICD-10-CM

## 2010-11-27 MED ORDER — HEPARIN SOD (PORK) LOCK FLUSH 100 UNIT/ML IV SOLN: 500.0000 [IU] | Freq: Once | INTRAVENOUS | Status: DC

## 2010-11-30 ENCOUNTER — Encounter: Payer: Self-pay | Admitting: *Deleted

## 2010-12-01 ENCOUNTER — Encounter: Payer: Self-pay | Admitting: *Deleted

## 2010-12-09 ENCOUNTER — Other Ambulatory Visit: Payer: Self-pay | Admitting: Family

## 2010-12-09 DIAGNOSIS — C3490 Malignant neoplasm of unspecified part of unspecified bronchus or lung: Secondary | ICD-10-CM

## 2010-12-24 ENCOUNTER — Ambulatory Visit: Payer: Managed Care, Other (non HMO)

## 2010-12-24 ENCOUNTER — Ambulatory Visit (HOSPITAL_BASED_OUTPATIENT_CLINIC_OR_DEPARTMENT_OTHER): Payer: Managed Care, Other (non HMO) | Admitting: Hematology & Oncology

## 2010-12-24 ENCOUNTER — Other Ambulatory Visit: Payer: Self-pay | Admitting: Hematology & Oncology

## 2010-12-24 ENCOUNTER — Other Ambulatory Visit (HOSPITAL_BASED_OUTPATIENT_CLINIC_OR_DEPARTMENT_OTHER): Payer: Managed Care, Other (non HMO) | Admitting: Lab

## 2010-12-24 VITALS — BP 131/93 | HR 80 | Temp 98.2°F | Ht 61.5 in | Wt 166.0 lb

## 2010-12-24 DIAGNOSIS — C3410 Malignant neoplasm of upper lobe, unspecified bronchus or lung: Secondary | ICD-10-CM

## 2010-12-24 DIAGNOSIS — C3490 Malignant neoplasm of unspecified part of unspecified bronchus or lung: Secondary | ICD-10-CM

## 2010-12-24 LAB — COMPREHENSIVE METABOLIC PANEL
Albumin: 4.6 g/dL (ref 3.5–5.2)
BUN: 15 mg/dL (ref 6–23)
CO2: 25 mEq/L (ref 19–32)
Calcium: 9.9 mg/dL (ref 8.4–10.5)
Chloride: 104 mEq/L (ref 96–112)
Creatinine, Ser: 0.72 mg/dL (ref 0.50–1.10)
Potassium: 3.5 mEq/L (ref 3.5–5.3)

## 2010-12-24 LAB — CBC WITH DIFFERENTIAL (CANCER CENTER ONLY)
BASO%: 0.7 % (ref 0.0–2.0)
EOS%: 1.5 % (ref 0.0–7.0)
LYMPH#: 1.7 10*3/uL (ref 0.9–3.3)
MCH: 28.6 pg (ref 26.0–34.0)
MCHC: 34.1 g/dL (ref 32.0–36.0)
MONO%: 4.8 % (ref 0.0–13.0)
NEUT#: 4.6 10*3/uL (ref 1.5–6.5)
Platelets: 209 10*3/uL (ref 145–400)
RBC: 4.47 10*6/uL (ref 3.70–5.32)

## 2010-12-24 LAB — LACTATE DEHYDROGENASE: LDH: 177 U/L (ref 94–250)

## 2010-12-24 MED ORDER — SODIUM CHLORIDE 0.9 % IJ SOLN
10.0000 mL | INTRAMUSCULAR | Status: DC | PRN
  Filled 2010-12-24: qty 10

## 2010-12-24 MED ORDER — ALTEPLASE 2 MG IJ SOLR
2.0000 mg | Freq: Once | INTRAMUSCULAR | Status: DC | PRN
  Filled 2010-12-24: qty 2

## 2010-12-24 MED ORDER — HEPARIN SOD (PORK) LOCK FLUSH 100 UNIT/ML IV SOLN
500.0000 [IU] | Freq: Once | INTRAVENOUS | Status: DC | PRN
  Filled 2010-12-24: qty 5

## 2010-12-24 NOTE — Progress Notes (Signed)
This office note has been dictated. CSN: [id number]

## 2010-12-25 NOTE — Progress Notes (Signed)
CC:   Keturah Barre, MD Carlton Adam, MD Tanvir A. Chodri, M.D. Austin Miles, MD  DIAGNOSIS:  Locally recurrent adenocarcinoma of the right lung.  CURRENT THERAPY:  Observation.  INTERIM HISTORY:  Ms. Tarkowski comes in for follow-up.  She is having a little bit of a migraine today.  She has these on occasion.  Otherwise, she is doing okay.  She did have a mammogram I think back in October.  Everything turned out okay with this.  She is still having the pain over in her right chest wall.  This is more chronic than anything else.  She has had no cough.  Her appetite has been good.  She will be spending Thanksgiving with her daughter.  There has been no change in bowel or bladder habits.  She has had no problems with swelling.  There have been no rashes.  PHYSICAL EXAMINATION:  General Appearance:  This is a well-developed, well-nourished white female in no obvious distress.  Vital Signs:  98.2, pulse 80, respiratory rate 18, blood pressure 131/93.  Weight is 166. Head and Neck Exam:  Shows a normocephalic, atraumatic skull.  There are no ocular or oral lesions.  There are no palpable cervical or supraclavicular lymph nodes.  Lungs:  Clear bilaterally.  Cardiac Exam: Regular rate and rhythm with a normal S1 and S2.  There are no murmurs, rubs or bruits.  Abdominal Exam:  Soft with good bowel sounds.  There is no palpable abdominal mass.  There is no palpable hepatosplenomegaly. Back Exam:  Shows a healed thoracotomy scar on the right lateral chest wall.  There is some tenderness along the thoracotomy scar.  No swelling or erythema are noted.  Extremities:  Show no clubbing, cyanosis or edema.  Neurological Exam:  No focal neurological deficits.  LABORATORY STUDIES:  White cell count is 6.8, hemoglobin 12.8, hematocrit 37.5, platelet count 209.  IMPRESSION:  Ms. Kavan is a 60 year old white female with a history of locally recurrent adenocarcinoma of the right lung.  She did  receive chemotherapy/radiation therapy for this.  She got Alimta.  She completed her treatments back in I believe April.  We will go ahead and plan for a follow-up CT scan in December.  I think this would be a reasonable approach for her.  We will plan to see her back after her scans are done.  She does have a Port-A-Cath in.  We will flush this when we see her today.    ______________________________ Josph Macho, M.D. PRE/MEDQ  D:  12/24/2010  T:  12/25/2010  Job:  [id number]

## 2011-01-25 IMAGING — CT CT CHEST W/ CM
2 of 4 series · 15 of 36 positions shown, 18 images · IV contrast (CONTRAST)
Comparison: 11/17/2008

CLINICAL DATA: Follow-up lung carcinoma.  Previous surgery
chemotherapy.

CT CHEST WITH CONTRAST
TECHNIQUE: Multidetector CT imaging of the chest was performed
following the standard protocol during bolus administration of
intravenous contrast.
Contrast: 75 ml 2mnipaque-QEE

[Series 3: w/ · axial · 0.68mm/px · z∈[+856,+1086]mm · 12 of 56 slices shown, 15 images]
[im 5/56  mediastinal]
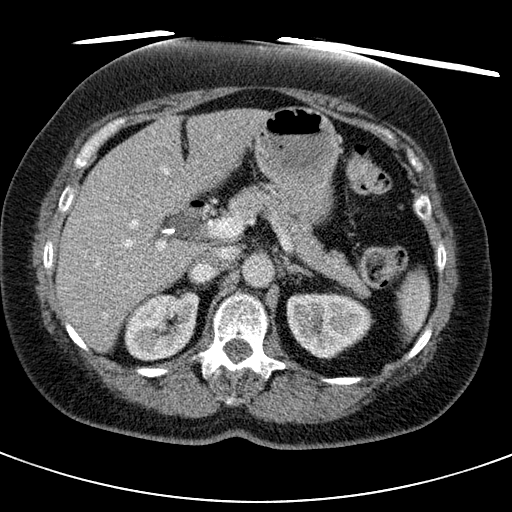
[im 5/56  lung]
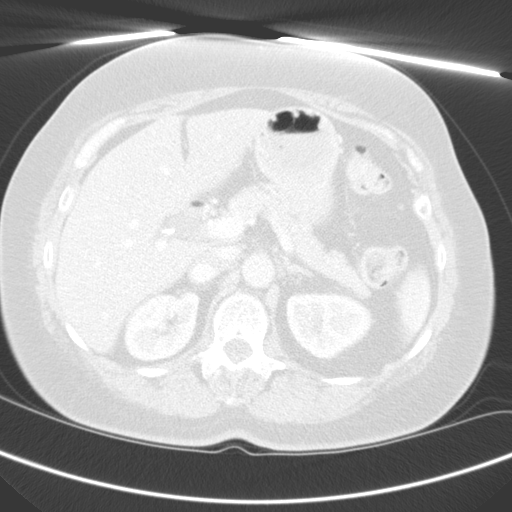
[im 9/56  lung]
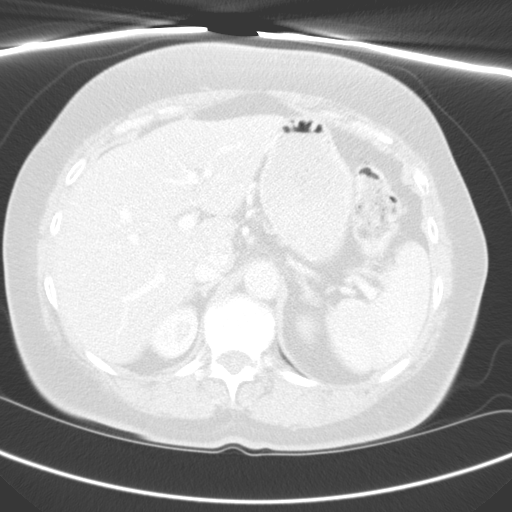
[im 13/56  lung]
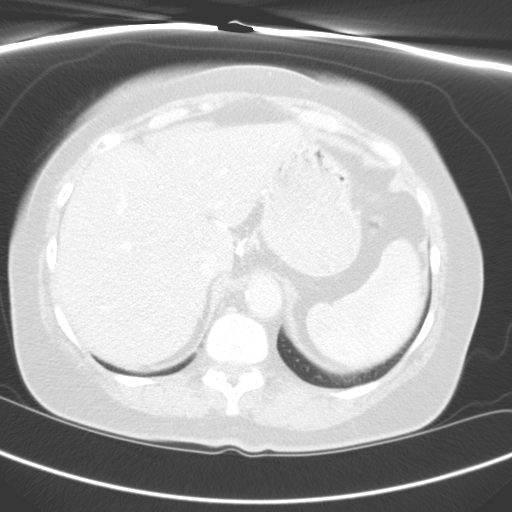
[im 17/56  lung]
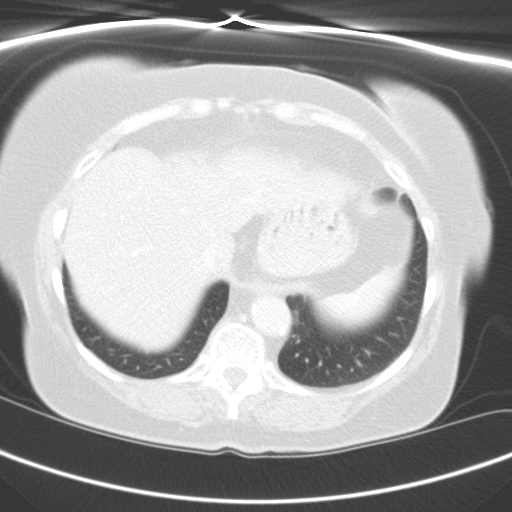
[im 22/56  mediastinal]
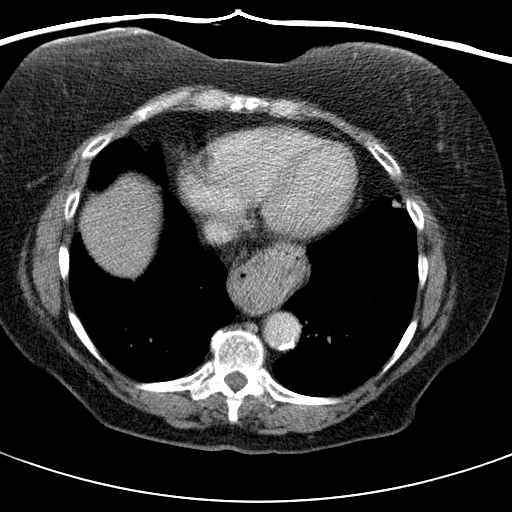
[im 22/56  lung]
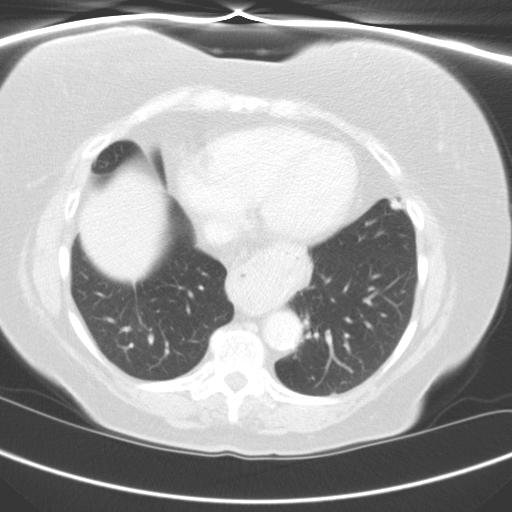
[im 26/56  lung]
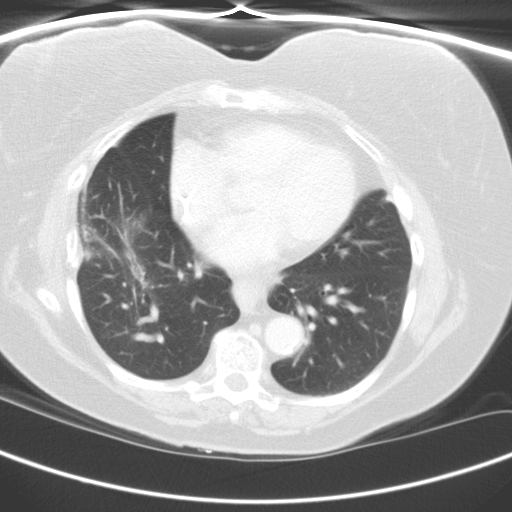
[im 30/56  lung]
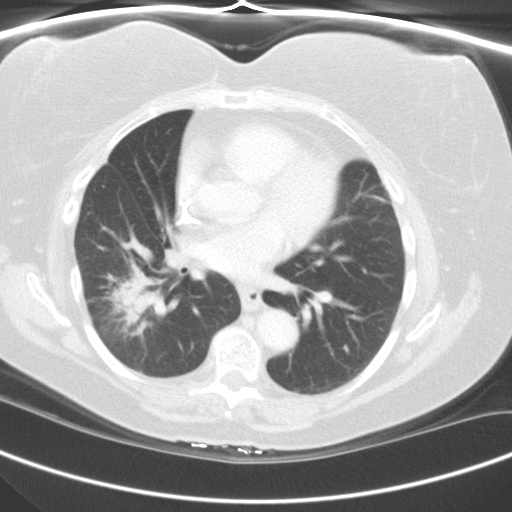
[im 34/56  lung]
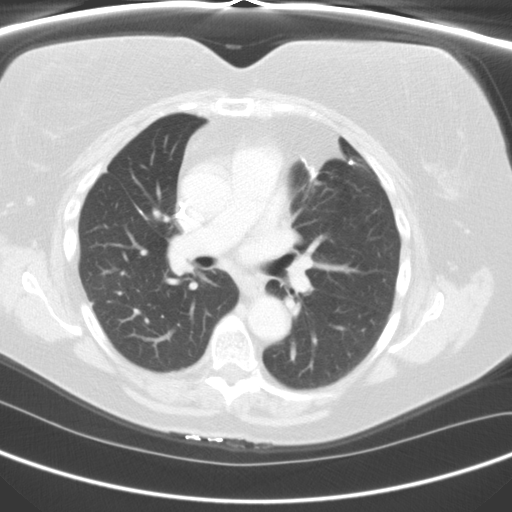
[im 39/56  mediastinal]
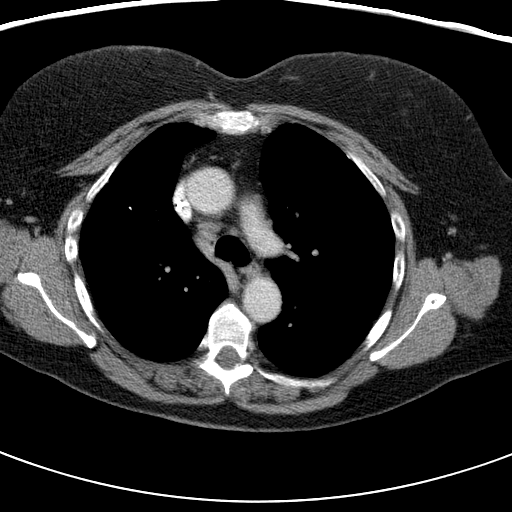
[im 39/56  lung]
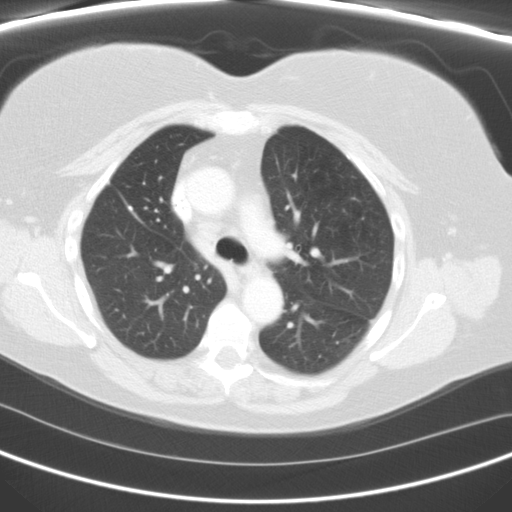
[im 43/56  lung]
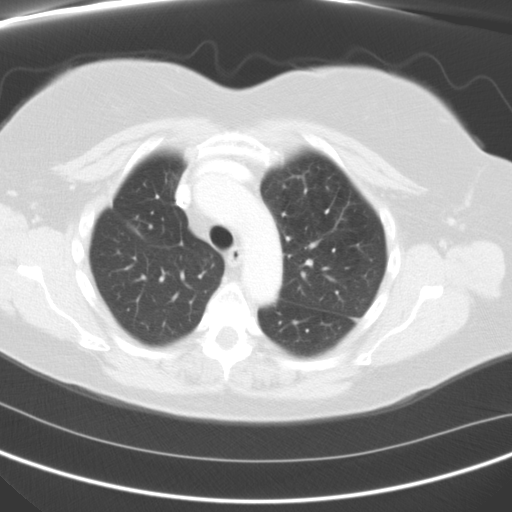
[im 47/56  lung]
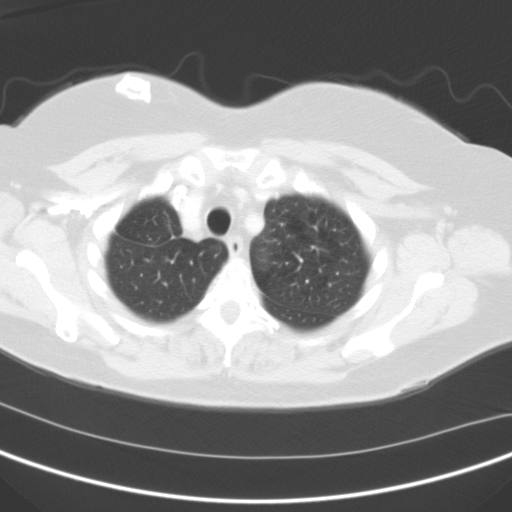
[im 51/56  lung]
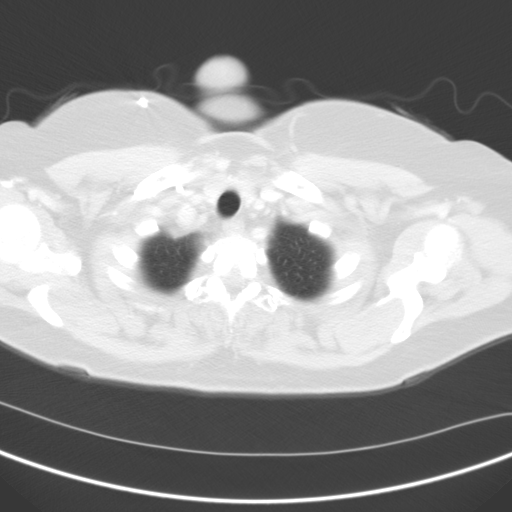

[coronals · coronal · 0.68mm/px · 3 of 112 slices shown]
[im 23/112  lung]
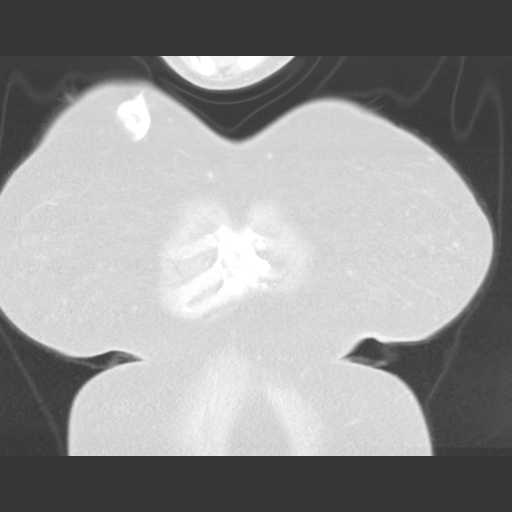
[im 45/112  lung]
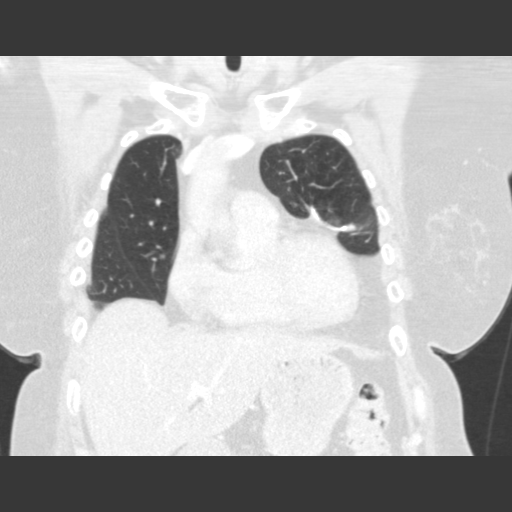
[im 67/112  lung]
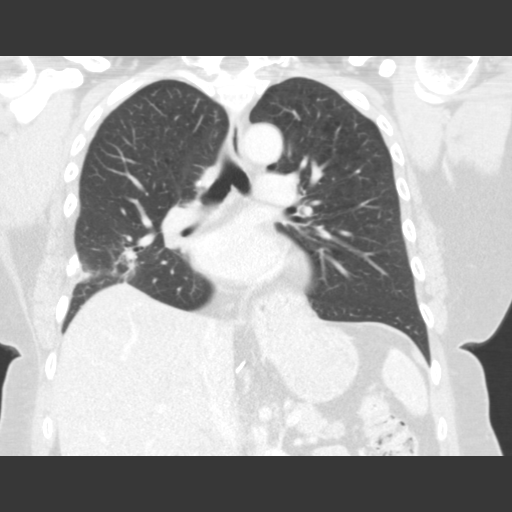

[15 of 36 positions shown; findings below may reference images not displayed]

FINDINGS: Decreased size of mass-like opacity is seen in the right
lower lobe, which now measures 2.6 x 3.1 cm compared with 3.2 x
cm on prior study.  Small peripheral nodular density in the lingula
is also mildly decreased in size, now measuring 5 x 8 mm compared
with 8 x 9 mm on prior study.  No new or enlarging pulmonary
nodules or masses are identified.

There is no evidence of pleural or pericardial effusion.  Shotty
less than 1 cm right paratracheal lymph nodes are stable.  No
pathologically enlarged nodes are seen.  Small hiatal hernia again
seen. Hepatic steatosis again noted.
IMPRESSION: 1.  Decreased size of mass-like opacity and central right lower
lobe.  Decreased size of subpleural nodule in the lingula.
2.  Stable shotty right paratracheal mediastinal lymph nodes.
3.  No new or progressive disease within the thorax.
4.  Hiatal hernia and hepatic steatosis.

## 2011-01-26 ENCOUNTER — Ambulatory Visit
Admission: RE | Admit: 2011-01-26 | Discharge: 2011-01-26 | Disposition: A | Discharge status: Home / Self Care | Payer: Managed Care, Other (non HMO) | Source: Ambulatory Visit | Attending: Hematology & Oncology | Admitting: Hematology & Oncology

## 2011-01-26 DIAGNOSIS — C3490 Malignant neoplasm of unspecified part of unspecified bronchus or lung: Secondary | ICD-10-CM

## 2011-01-26 MED ORDER — IOHEXOL 300 MG/ML  SOLN
75.0000 mL | Freq: Once | INTRAMUSCULAR | Status: AC | PRN
  Administered 2011-01-26: 75 mL via INTRAVENOUS

## 2011-02-04 ENCOUNTER — Other Ambulatory Visit: Payer: Medicare Other | Admitting: Lab

## 2011-02-04 ENCOUNTER — Ambulatory Visit: Payer: Managed Care, Other (non HMO)

## 2011-02-04 ENCOUNTER — Ambulatory Visit (HOSPITAL_BASED_OUTPATIENT_CLINIC_OR_DEPARTMENT_OTHER): Payer: Managed Care, Other (non HMO) | Admitting: Hematology & Oncology

## 2011-02-04 ENCOUNTER — Ambulatory Visit (HOSPITAL_BASED_OUTPATIENT_CLINIC_OR_DEPARTMENT_OTHER)
Admission: RE | Admit: 2011-02-04 | Discharge: 2011-02-04 | Disposition: A | Discharge status: Home / Self Care | Payer: Medicare Other | Source: Ambulatory Visit | Attending: Hematology & Oncology | Admitting: Hematology & Oncology

## 2011-02-04 VITALS — BP 123/90 | HR 70 | Temp 97.9°F

## 2011-02-04 DIAGNOSIS — C3410 Malignant neoplasm of upper lobe, unspecified bronchus or lung: Secondary | ICD-10-CM

## 2011-02-04 DIAGNOSIS — E7889 Other lipoprotein metabolism disorders: Secondary | ICD-10-CM

## 2011-02-04 DIAGNOSIS — M25559 Pain in unspecified hip: Secondary | ICD-10-CM

## 2011-02-04 DIAGNOSIS — M545 Low back pain: Secondary | ICD-10-CM

## 2011-02-04 DIAGNOSIS — C3490 Malignant neoplasm of unspecified part of unspecified bronchus or lung: Secondary | ICD-10-CM

## 2011-02-04 DIAGNOSIS — M810 Age-related osteoporosis without current pathological fracture: Secondary | ICD-10-CM

## 2011-02-04 DIAGNOSIS — R109 Unspecified abdominal pain: Secondary | ICD-10-CM

## 2011-02-04 MED ORDER — HEPARIN SOD (PORK) LOCK FLUSH 100 UNIT/ML IV SOLN
500.0000 [IU] | Freq: Once | INTRAVENOUS | Status: AC | PRN
  Administered 2011-02-04: 500 [IU] via INTRAVENOUS
  Filled 2011-02-04: qty 5

## 2011-02-04 MED ORDER — SODIUM CHLORIDE 0.9 % IJ SOLN
10.0000 mL | INTRAMUSCULAR | Status: DC | PRN
  Administered 2011-02-04: 10 mL via INTRAVENOUS
  Filled 2011-02-04: qty 10

## 2011-02-04 MED ORDER — TRAMADOL HCL 50 MG PO TABS: ORAL_TABLET | ORAL | Status: DC

## 2011-02-04 NOTE — Progress Notes (Signed)
CC:   Keturah Barre, MD Carlton Adam, MD Tanvir A. Chodri, M.D. Austin Miles, MD  DIAGNOSIS:  Locally recurrent adenocarcinoma of the lung, in remission.  CURRENT THERAPY:  Observation.  INTERIM HISTORY:  Ms. Corrie comes in for followup.  Her problem now is that she has pain in the left hip.  This started 3 weeks ago.  There is some radiation down the lateral left thigh.  There has been no leg weakness.  She also has some slight lower back discomfort.  She is taking some Motrin which has not helped her much. We did go ahead and repeat her CT scan.  This was done on 01/26/2011. The CT scan did not show any evidence of recurrent disease.  She has some hepatic steatosis.  She also has some abdominal pain.  Again, this may be reflective of some scarring that she had from past surgery.  She has had no problem with bowels or bladder.  Her appetite has been okay.  She unfortunately did not have a good Christmas because of the left hip pain.  She has had no problems with headache.  She does have an aneurysm which is being followed at [facility].  PHYSICAL EXAM:  General:  This is a well-developed, well-nourished white female in no obvious distress.  Vital Signs:  Temperature 97.9, pulse 70, respiratory rate 14, blood pressure 123/90.  Weight is 164. Head/Neck:  Exam shows a normocephalic, atraumatic skull.  There are no ocular or oral lesions.  There are no palpable cervical or supraclavicular lymph nodes.  Lungs:  Clear to percussion and auscultation bilaterally.  There are no rales, wheezes or rhonchi. Cardiac:  Regular rate and rhythm with a normal S1, S2.  There are no murmurs, rubs or bruits.  Abdomen:  Soft with good bowel sounds.  There is no palpable abdominal mass.  There is some slight tenderness in the epigastric area.  There is no fluid wave.  There is no palpable hepatosplenomegaly.  Back:  Exam shows some slight spasm in the lumbosacral spine.  She has no tenderness  over the spine itself.  There is some tenderness to palpation in the left hip.  Extremities:  No clubbing, cyanosis or edema.  She has good strength in her legs bilaterally.  There is a questionable positive straight leg raise on the left.  She has good pulses in her distal extremities.  Neurologic:  Exam shows no focal neurological deficits.  LABORATORY STUDIES:  Not done this visit.  IMPRESSION:  Ms. Peterkin is a 60 year old white female with a history of locally recurrent adenocarcinoma of the right lung.  She underwent chemo and radiation therapy for this.  She tolerated this well.  She completed this back in April.  I am not sure at all what is going on with this pain in the left hip.  I sent her down to the imaging center for some plain films. Unfortunately, their machine was broken.  We will have to have her come back for regular x-rays.  If the x-rays do not help, then she is probably going to need to have an MRI done.  I would think that it would be highly unlikely that this would be malignant disease.  Her recurrences really have been to her lung.  It is possible that we may have to refer her to orthopedic surgery.  I think she may have seen Dr. Jerl Santos in the past.  Ultimately, an MRI might need be done.  I will see Ms. Nier  back in another 6 weeks or so if all looks good on her x-rays/scans.    ______________________________ Josph Macho, M.D. PRE/MEDQ  D:  02/04/2011  T:  02/04/2011  Job:  [id number]  ADDENDUM:  X-rays of LS spine and left hip are NEGATIVE for mets.

## 2011-02-04 NOTE — Progress Notes (Signed)
This office note has been dictated.

## 2011-02-05 ENCOUNTER — Ambulatory Visit (HOSPITAL_BASED_OUTPATIENT_CLINIC_OR_DEPARTMENT_OTHER)
Admission: RE | Admit: 2011-02-05 | Discharge: 2011-02-05 | Disposition: A | Discharge status: Home / Self Care | Payer: Managed Care, Other (non HMO) | Source: Ambulatory Visit | Attending: Hematology & Oncology | Admitting: Hematology & Oncology

## 2011-02-05 DIAGNOSIS — M25559 Pain in unspecified hip: Secondary | ICD-10-CM | POA: Insufficient documentation

## 2011-02-05 DIAGNOSIS — M545 Low back pain, unspecified: Secondary | ICD-10-CM | POA: Insufficient documentation

## 2011-02-05 DIAGNOSIS — Z85118 Personal history of other malignant neoplasm of bronchus and lung: Secondary | ICD-10-CM

## 2011-02-07 ENCOUNTER — Telehealth: Payer: Self-pay | Admitting: *Deleted

## 2011-02-07 NOTE — Telephone Encounter (Signed)
Mailed 03-18-11 schedule

## 2011-03-18 ENCOUNTER — Other Ambulatory Visit: Payer: Managed Care, Other (non HMO) | Admitting: Lab

## 2011-03-18 ENCOUNTER — Ambulatory Visit: Payer: Managed Care, Other (non HMO) | Admitting: Hematology & Oncology

## 2011-03-21 ENCOUNTER — Ambulatory Visit (HOSPITAL_BASED_OUTPATIENT_CLINIC_OR_DEPARTMENT_OTHER): Payer: Managed Care, Other (non HMO) | Admitting: Hematology & Oncology

## 2011-03-21 ENCOUNTER — Other Ambulatory Visit (HOSPITAL_BASED_OUTPATIENT_CLINIC_OR_DEPARTMENT_OTHER): Payer: Managed Care, Other (non HMO) | Admitting: Lab

## 2011-03-21 VITALS — BP 128/88 | HR 67 | Temp 97.7°F | Ht 61.5 in | Wt 167.0 lb

## 2011-03-21 DIAGNOSIS — C3410 Malignant neoplasm of upper lobe, unspecified bronchus or lung: Secondary | ICD-10-CM

## 2011-03-21 DIAGNOSIS — M129 Arthropathy, unspecified: Secondary | ICD-10-CM

## 2011-03-21 DIAGNOSIS — M545 Low back pain: Secondary | ICD-10-CM

## 2011-03-21 DIAGNOSIS — C3490 Malignant neoplasm of unspecified part of unspecified bronchus or lung: Secondary | ICD-10-CM

## 2011-03-21 DIAGNOSIS — M810 Age-related osteoporosis without current pathological fracture: Secondary | ICD-10-CM

## 2011-03-21 LAB — CBC WITH DIFFERENTIAL (CANCER CENTER ONLY)
BASO%: 0.8 % (ref 0.0–2.0)
EOS%: 1.5 % (ref 0.0–7.0)
HCT: 36.4 % (ref 34.8–46.6)
LYMPH#: 2.2 10*3/uL (ref 0.9–3.3)
MCHC: 33 g/dL (ref 32.0–36.0)
MONO#: 0.5 10*3/uL (ref 0.1–0.9)
NEUT#: 4.4 10*3/uL (ref 1.5–6.5)
RDW: 15 % (ref 11.1–15.7)
WBC: 7.3 10*3/uL (ref 3.9–10.0)

## 2011-03-21 NOTE — Progress Notes (Signed)
This office note has been dictated.

## 2011-03-21 NOTE — Progress Notes (Signed)
CC:   Keturah Barre, MD Carlton Adam, MD Tanvir A. Chodri, M.D. Austin Miles, MD  DIAGNOSIS:  Local recurrent adenocarcinoma of the lung.  CURRENT THERAPY:  Observation.  INTERIM HISTORY:  Ms. Talamante comes in followup.  We last saw her back in December.  She was having problems with her left hip.  We did go ahead and get x-rays of the hip.  Those x-rays did not show any abnormal findings.  We also got an x-ray of her lumbar spine.  Again, the lumbar spine films all looked okay.  She has some age-related related changes but nothing that would suggest malignancy. She has had no problems with fever.  There has been no cough.  She has some rib issues which come and go. She has had a good appetite.  She is gaining weight.  She is not too happy about this.  She has had no fever, sweats, or chills.  She has had no rashes.  There has been no bleeding.  There has been no change in bowel or bladder habits.  PHYSICAL EXAM:  General:  This is a well-developed, well-nourished white female in no obvious distress.  Vital signs:  96.7, pulse 67, respiratory rate 14, blood pressure 128/88.  Weight is 167.  Head and neck:  Shows a normocephalic, atraumatic skull.  There are no ocular or oral lesions.  There are no palpable cervical or supraclavicular lymph nodes.  Lungs:  Clear bilaterally.  She has no rales, wheezes or rhonchi.  Cardiac:  Regular rate and rhythm with a normal S1, S2.  No murmurs, rubs or bruits.  Abdomen:  Soft with good bowel sounds.  There is no palpable abdominal mass.  There is no fluid wave.  There is no palpable hepatosplenomegaly.  Extremities:  Shows no clubbing, cyanosis or edema.  Neurologic:  Exam shows no focal neurological deficits. Back:  No tenderness over the spine, ribs, or hips.  Extremities:  No clubbing, cyanosis or edema.  LABORATORY STUDIES:  White cell count of 7.3, hemoglobin 12, hematocrit 36.4, platelet count 206.  IMPRESSION:  [name] is a  61 year old white female with locally recurrent adenocarcinoma of the lung.  She underwent chemo and radiation therapy for this.  She completed therapy for this in April 2012. So far, we have not seen any evidence of recurrence.  We still have to follow her along.  I will set her up with another CT scan of the chest in 1 month.  Her last CT was back in December 2012.  I am glad to see her quality of life is doing well.  I just feel bad that she keeps having these arthritic and orthopedic issues.    ______________________________ Josph Macho, M.D. PRE/MEDQ  D:  03/21/2011  T:  03/21/2011  Job:  [id number]

## 2011-03-22 LAB — COMPREHENSIVE METABOLIC PANEL
ALT: 13 U/L (ref 0–35)
AST: 14 U/L (ref 0–37)
Albumin: 4 g/dL (ref 3.5–5.2)
CO2: 27 mEq/L (ref 19–32)
Calcium: 9.2 mg/dL (ref 8.4–10.5)
Chloride: 104 mEq/L (ref 96–112)
Potassium: 3.9 mEq/L (ref 3.5–5.3)
Sodium: 139 mEq/L (ref 135–145)
Total Protein: 6.5 g/dL (ref 6.0–8.3)

## 2011-03-22 LAB — LACTATE DEHYDROGENASE: LDH: 174 U/L (ref 94–250)

## 2011-03-29 ENCOUNTER — Encounter: Payer: Self-pay | Admitting: *Deleted

## 2011-03-29 NOTE — Progress Notes (Signed)
Pt Dr. Myna Hidalgo, pt called, left message on home answering machine that labs from 03/21/11 were normal.

## 2011-04-20 ENCOUNTER — Ambulatory Visit
Admission: RE | Admit: 2011-04-20 | Discharge: 2011-04-20 | Disposition: A | Discharge status: Home / Self Care | Payer: Managed Care, Other (non HMO) | Source: Ambulatory Visit | Attending: Hematology & Oncology | Admitting: Hematology & Oncology

## 2011-04-20 DIAGNOSIS — C3490 Malignant neoplasm of unspecified part of unspecified bronchus or lung: Secondary | ICD-10-CM

## 2011-04-20 MED ORDER — IOHEXOL 300 MG/ML  SOLN
75.0000 mL | Freq: Once | INTRAMUSCULAR | Status: AC | PRN
  Administered 2011-04-20: 75 mL via INTRAVENOUS

## 2011-04-26 ENCOUNTER — Ambulatory Visit (HOSPITAL_BASED_OUTPATIENT_CLINIC_OR_DEPARTMENT_OTHER): Payer: Medicare Other

## 2011-04-26 ENCOUNTER — Ambulatory Visit (HOSPITAL_BASED_OUTPATIENT_CLINIC_OR_DEPARTMENT_OTHER): Payer: Managed Care, Other (non HMO) | Admitting: Hematology & Oncology

## 2011-04-26 ENCOUNTER — Other Ambulatory Visit (HOSPITAL_BASED_OUTPATIENT_CLINIC_OR_DEPARTMENT_OTHER): Payer: Managed Care, Other (non HMO) | Admitting: Lab

## 2011-04-26 VITALS — BP 130/87 | HR 81 | Temp 97.0°F | Wt 163.0 lb

## 2011-04-26 DIAGNOSIS — M810 Age-related osteoporosis without current pathological fracture: Secondary | ICD-10-CM

## 2011-04-26 DIAGNOSIS — K449 Diaphragmatic hernia without obstruction or gangrene: Secondary | ICD-10-CM

## 2011-04-26 DIAGNOSIS — C3410 Malignant neoplasm of upper lobe, unspecified bronchus or lung: Secondary | ICD-10-CM

## 2011-04-26 DIAGNOSIS — C3490 Malignant neoplasm of unspecified part of unspecified bronchus or lung: Secondary | ICD-10-CM

## 2011-04-26 LAB — CBC WITH DIFFERENTIAL (CANCER CENTER ONLY)
BASO#: 0.1 10*3/uL (ref 0.0–0.2)
BASO%: 1.1 % (ref 0.0–2.0)
EOS%: 2.4 % (ref 0.0–7.0)
HCT: 37.7 % (ref 34.8–46.6)
HGB: 12.5 g/dL (ref 11.6–15.9)
LYMPH%: 28.3 % (ref 14.0–48.0)
MCH: 28 pg (ref 26.0–34.0)
MCHC: 33.2 g/dL (ref 32.0–36.0)
MCV: 84 fL (ref 81–101)
MONO%: 7.5 % (ref 0.0–13.0)
NEUT%: 60.7 % (ref 39.6–80.0)
RDW: 15 % (ref 11.1–15.7)

## 2011-04-26 MED ORDER — ZOLEDRONIC ACID 4 MG/100ML IV SOLN
4.0000 mg | Freq: Once | INTRAVENOUS | Status: AC
  Administered 2011-04-26: 4 mg via INTRAVENOUS
  Filled 2011-04-26: qty 100

## 2011-04-26 MED ORDER — SODIUM CHLORIDE 0.9 % IJ SOLN
10.0000 mL | INTRAMUSCULAR | Status: DC | PRN
  Administered 2011-04-26: 10 mL via INTRAVENOUS
  Filled 2011-04-26: qty 10

## 2011-04-26 MED ORDER — HEPARIN SOD (PORK) LOCK FLUSH 100 UNIT/ML IV SOLN
500.0000 [IU] | Freq: Once | INTRAVENOUS | Status: AC | PRN
  Administered 2011-04-26: 500 [IU] via INTRAVENOUS
  Filled 2011-04-26: qty 5

## 2011-04-26 NOTE — Progress Notes (Signed)
This office note has been dictated.

## 2011-04-26 NOTE — Progress Notes (Signed)
CC:   Keturah Barre, MD Carlton Adam, MD Tanvir A. Chodri, M.D. Austin Miles, MD  DIAGNOSIS:  Locally recurrent adenocarcinoma of the lung.  CURRENT THERAPY:  Observation.  INTERIM HISTORY:  Ms. Hepworth comes in for her followup.  She did have another CT scan done.  This was done on 04/20/2011.  There was some concern raised by the radiologist.  There was noted to be in the left upper lobe a nodular density measuring 1 x 0.5 cm.  This previously measured 0.7 x 0.3 cm.  Outside of that, there were no other abnormalities.  She has a stable lesion in the right lower lobe measuring 3.5 cm.  This has been noted previously.  There was no mediastinal or hilar adenopathy.  She had moderate size hiatal hernia.  She still has pain in the epigastric region.  I suspect that this is probably the hiatal hernia.  She still has pain over on the right chest wall, which has been chronic.  She may have some osteoporotic changes. I want to see about giving her a dose of Reclast yearly.  She is on calcium and vitamin D.  She has had a good appetite.  There has been no diarrhea.  She has had some nausea.  There has been no leg swelling.  PHYSICAL EXAMINATION:  General Appearance:  This is a well-developed, well-nourished, white female in no obvious distress.  Vital Signs:  97, pulse 81, respiratory rate 18, blood pressure 130/87.  Weight is 163. Head and Neck Exam:  No ocular or oral lesions.  There are no palpable cervical or supraclavicular lymph nodes.  Lungs:  Clear bilaterally. Cardiac Exam:  Regular rate and rhythm with a normal S1 and S2.  There are no murmurs, rubs, or bruits.  Abdominal Exam:  Soft with good bowel sounds.  There is no fluid wave.  There is no palpable abdominal mass. There is some tenderness in the epigastric region.  There is no palpable hepatosplenomegaly.  Extremities:  No clubbing, cyanosis, or edema. Back Exam:  Tenderness over in the right lateral ribcage, which  is chronic.  Neurological Exam:  No focal neurological deficits.  LABORATORY STUDIES:  White cell count 6.3, hemoglobin 12.5, hematocrit 37.7, platelet count is 206.  IMPRESSION:  Ms. Prentiss is a 61 year old, white female with a history of locally recurrent adenocarcinoma of the lung.  There is certainly some concern about another recurrence.  However, this area is too small really to be for further evaluated right now.  I think we are going to have to repeat a CT scan in 6 weeks.  At that point in time, I think we will have a much better idea as to whether or not this is another recurrence.  If so, then she might be a candidate for stereotactic radiosurgery.  She sees Dr. Marcelline Mates out at [facility] in a couple weeks for followup for her past radiation out there.  They will have access to these scans.  I will see Ms. Hilbert back after she has her CT scan at the end of April.    ______________________________ Josph Macho, M.D. PRE/MEDQ  D:  04/26/2011  T:  04/26/2011  Job:  [id number]

## 2011-04-27 LAB — COMPREHENSIVE METABOLIC PANEL
ALT: 14 U/L (ref 0–35)
AST: 15 U/L (ref 0–37)
Alkaline Phosphatase: 70 U/L (ref 39–117)
BUN: 18 mg/dL (ref 6–23)
Calcium: 9.1 mg/dL (ref 8.4–10.5)
Chloride: 106 mEq/L (ref 96–112)
Creatinine, Ser: 0.72 mg/dL (ref 0.50–1.10)
Total Bilirubin: 0.4 mg/dL (ref 0.3–1.2)

## 2011-04-29 ENCOUNTER — Encounter: Payer: Self-pay | Admitting: Hematology & Oncology

## 2011-05-02 ENCOUNTER — Telehealth: Payer: Self-pay | Admitting: Hematology & Oncology

## 2011-05-02 DIAGNOSIS — C3490 Malignant neoplasm of unspecified part of unspecified bronchus or lung: Secondary | ICD-10-CM | POA: Insufficient documentation

## 2011-05-02 NOTE — Telephone Encounter (Signed)
Faxed records to Terance Hart @ [facility] per phoned request.

## 2011-05-09 ENCOUNTER — Telehealth: Payer: Self-pay | Admitting: *Deleted

## 2011-05-09 NOTE — Telephone Encounter (Addendum)
Message copied by Mirian Capuchin on Mon May 09, 2011  3:38 PM ------      Message from: Arlan Organ R      Created: Thu May 05, 2011  9:12 PM       Call:  Labs and vit D look great!!!  Cindee Lame  Left this message on pt's home answering machine. Also put these lab results in the mail to her as requested.

## 2011-05-14 IMAGING — CT CT CHEST W/ CM
2 of 4 series · 15 of 36 positions shown, 18 images · IV contrast (CONTRAST)
Comparison: CT 03/12/2009.

CLINICAL DATA: Follow-up lung carcinoma.  Previous surgery and
chemotherapy.

CT CHEST WITH CONTRAST
TECHNIQUE: Multidetector CT imaging of the chest was performed
following the standard protocol during bolus administration of
intravenous contrast.
Contrast: 75 ml Jmnipaque-SEE IV.

[Series 2: w/ · axial · 0.68mm/px · z∈[+902,+1122]mm · 12 of 53 slices shown, 15 images]
[im 5/53  mediastinal]
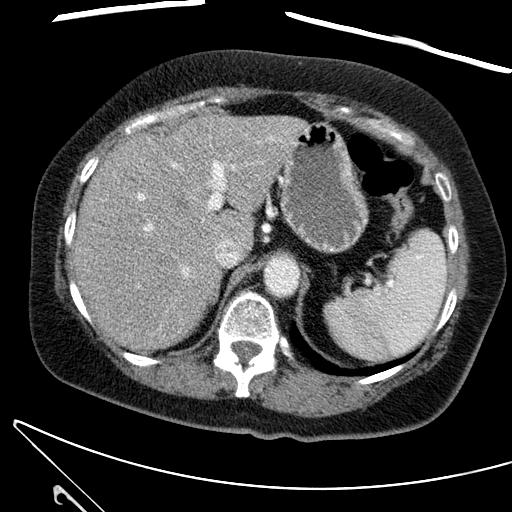
[im 5/53  lung]
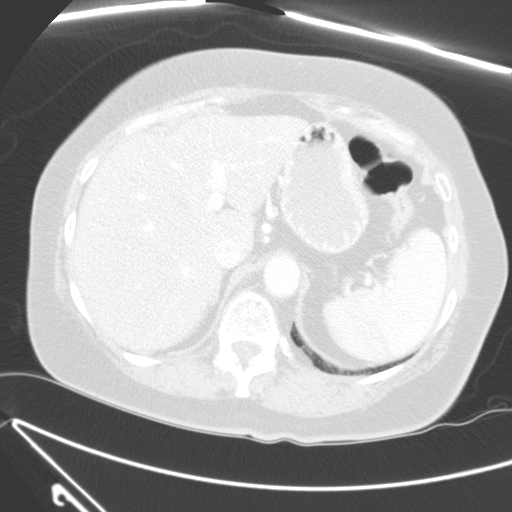
[im 9/53  lung]
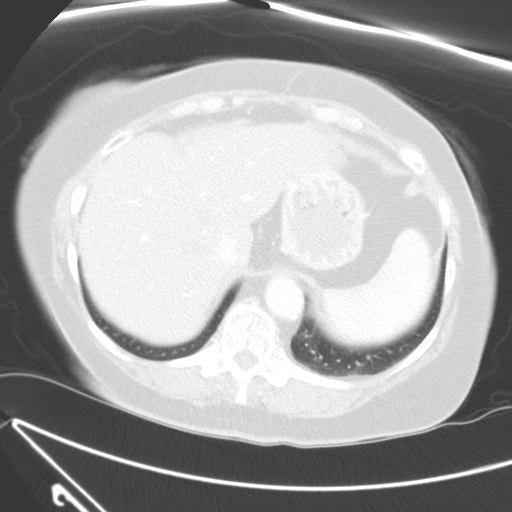
[im 13/53  lung]
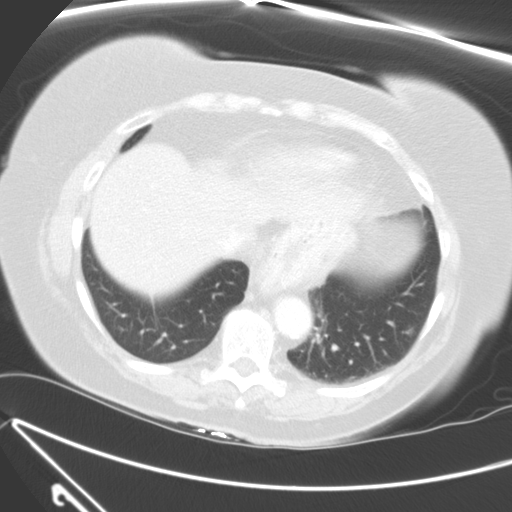
[im 17/53  lung]
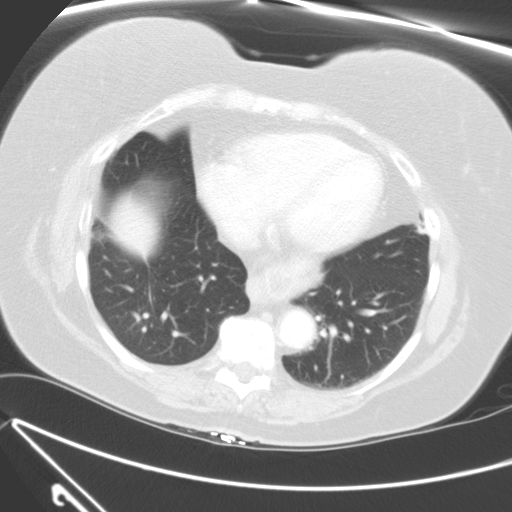
[im 21/53  mediastinal]
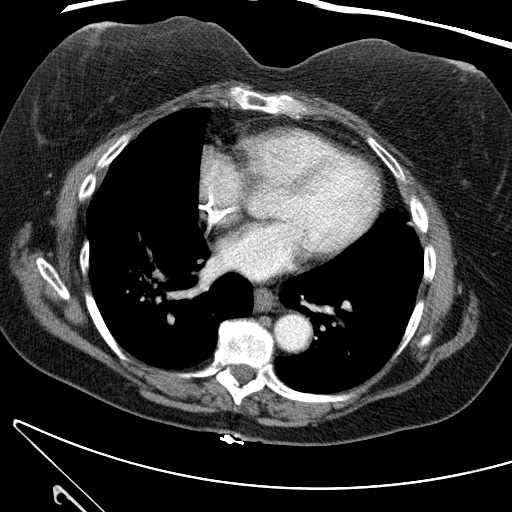
[im 21/53  lung]
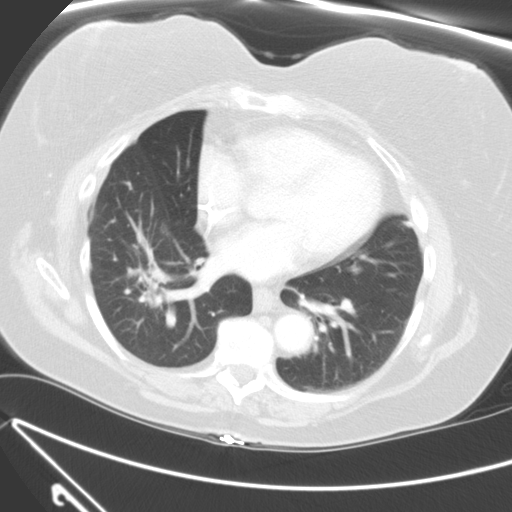
[im 25/53  lung]
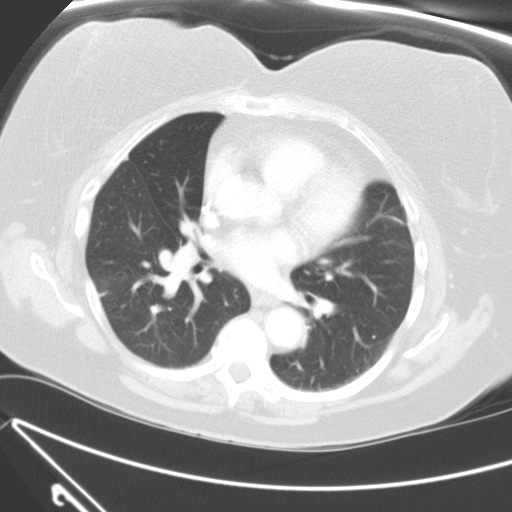
[im 29/53  lung]
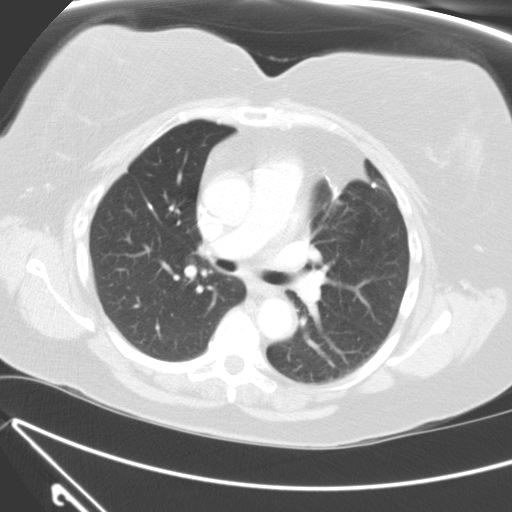
[im 33/53  lung]
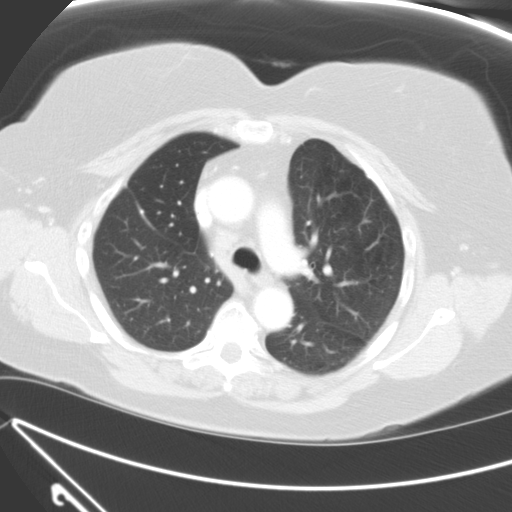
[im 37/53  mediastinal]
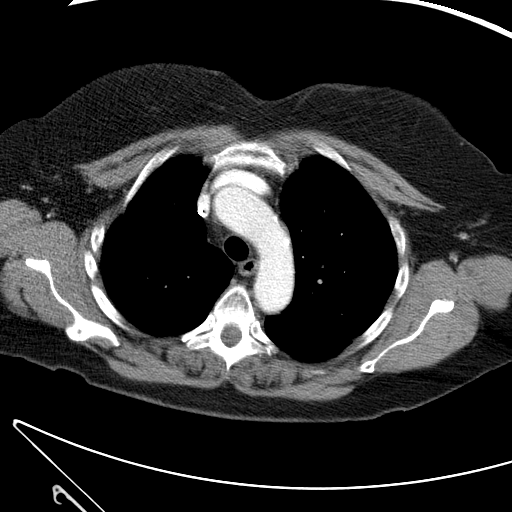
[im 37/53  lung]
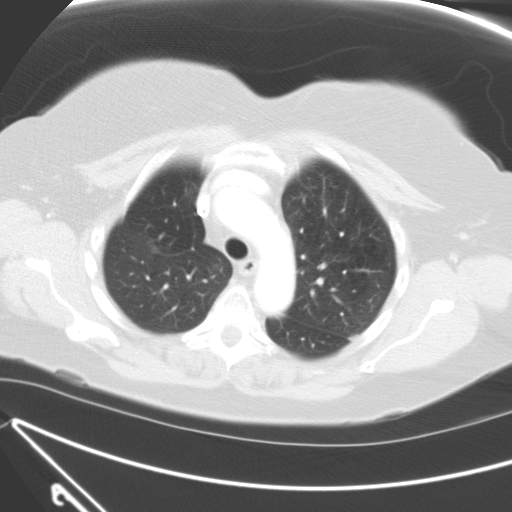
[im 41/53  lung]
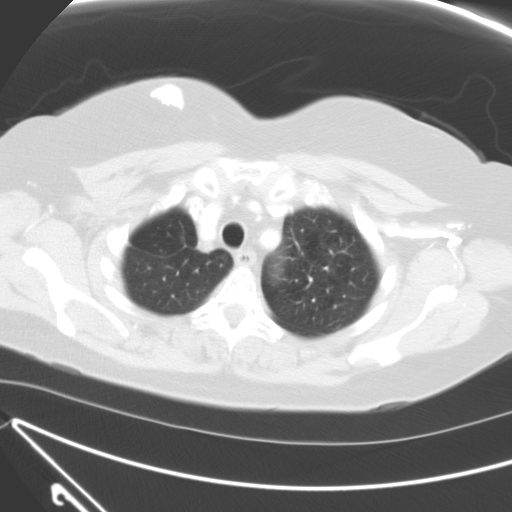
[im 45/53  lung]
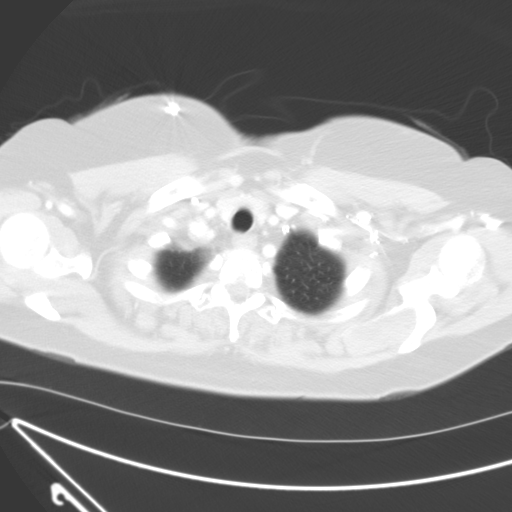
[im 49/53  lung]
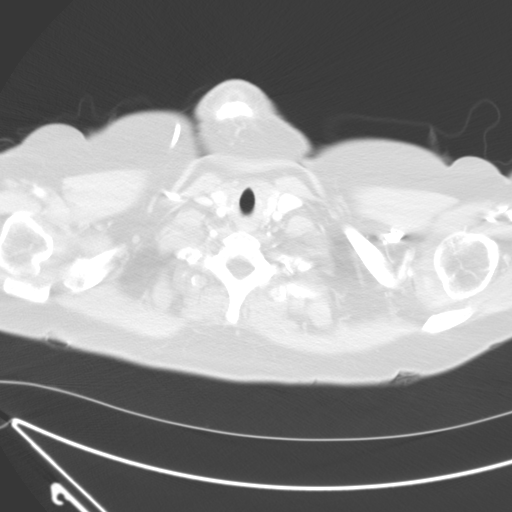

[coronals · coronal · 0.68mm/px · 3 of 110 slices shown]
[im 22/110  lung]
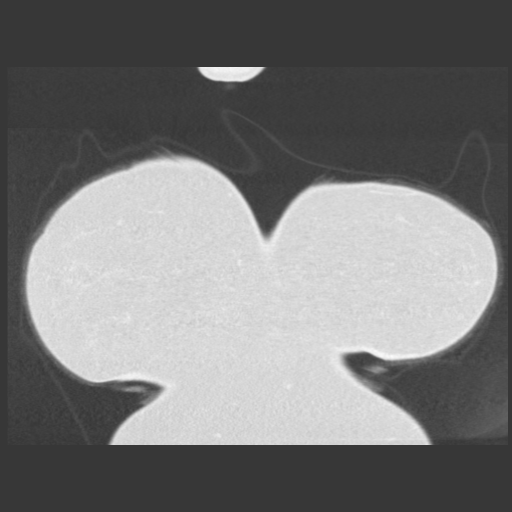
[im 44/110  lung]
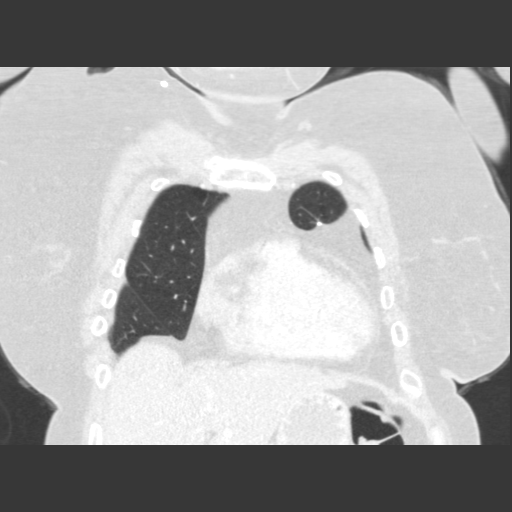
[im 66/110  lung]
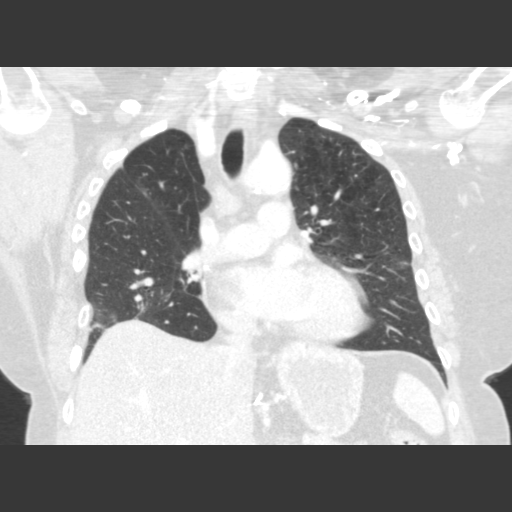

[15 of 36 positions shown; findings below may reference images not displayed]

FINDINGS: Again appreciated is a mass-like opacity in the right
lower lobe measuring 2.3 x 3.1 cm.  Previously the density measures
2.6 x 3.1 cm. The small lingular opacity is stable.

No pathologically enlarged lymph nodes.  Hiatal hernia.  Port-A-
Cath is near the SVC - right atrial junction.

Bony thorax is intact.
IMPRESSION: Minimal decrease in size of the right lower lobe opacity.

## 2011-06-06 ENCOUNTER — Other Ambulatory Visit: Payer: Managed Care, Other (non HMO)

## 2011-06-07 ENCOUNTER — Ambulatory Visit (HOSPITAL_BASED_OUTPATIENT_CLINIC_OR_DEPARTMENT_OTHER): Payer: Managed Care, Other (non HMO)

## 2011-06-07 VITALS — BP 104/77 | HR 66 | Temp 98.1°F

## 2011-06-07 DIAGNOSIS — C342 Malignant neoplasm of middle lobe, bronchus or lung: Secondary | ICD-10-CM

## 2011-06-07 DIAGNOSIS — Z452 Encounter for adjustment and management of vascular access device: Secondary | ICD-10-CM

## 2011-06-07 DIAGNOSIS — C3410 Malignant neoplasm of upper lobe, unspecified bronchus or lung: Secondary | ICD-10-CM

## 2011-06-07 MED ORDER — SODIUM CHLORIDE 0.9 % IJ SOLN
10.0000 mL | INTRAMUSCULAR | Status: DC | PRN
  Administered 2011-06-07: 10 mL via INTRAVENOUS
  Filled 2011-06-07: qty 10

## 2011-06-07 MED ORDER — HEPARIN SOD (PORK) LOCK FLUSH 100 UNIT/ML IV SOLN
500.0000 [IU] | Freq: Once | INTRAVENOUS | Status: AC
  Administered 2011-06-07: 500 [IU] via INTRAVENOUS
  Filled 2011-06-07: qty 5

## 2011-06-13 ENCOUNTER — Ambulatory Visit: Payer: Managed Care, Other (non HMO) | Admitting: Hematology & Oncology

## 2011-06-13 ENCOUNTER — Other Ambulatory Visit (HOSPITAL_BASED_OUTPATIENT_CLINIC_OR_DEPARTMENT_OTHER): Payer: Managed Care, Other (non HMO) | Admitting: Lab

## 2011-06-17 ENCOUNTER — Telehealth: Payer: Self-pay | Admitting: Hematology & Oncology

## 2011-06-17 NOTE — Telephone Encounter (Signed)
Pt aware of 5-14 CT NPO 4 hrs and 5-16 MD appointments

## 2011-06-21 ENCOUNTER — Other Ambulatory Visit: Payer: Managed Care, Other (non HMO)

## 2011-06-23 ENCOUNTER — Ambulatory Visit: Payer: Managed Care, Other (non HMO) | Admitting: Hematology & Oncology

## 2011-06-23 ENCOUNTER — Other Ambulatory Visit: Payer: Managed Care, Other (non HMO) | Admitting: Lab

## 2011-07-11 ENCOUNTER — Ambulatory Visit
Admission: RE | Admit: 2011-07-11 | Discharge: 2011-07-11 | Disposition: A | Discharge status: Home / Self Care | Payer: Managed Care, Other (non HMO) | Source: Ambulatory Visit | Attending: Hematology & Oncology | Admitting: Hematology & Oncology

## 2011-07-11 DIAGNOSIS — C3490 Malignant neoplasm of unspecified part of unspecified bronchus or lung: Secondary | ICD-10-CM

## 2011-07-11 MED ORDER — IOHEXOL 300 MG/ML  SOLN
75.0000 mL | Freq: Once | INTRAMUSCULAR | Status: AC | PRN
  Administered 2011-07-11: 75 mL via INTRAVENOUS

## 2011-07-21 ENCOUNTER — Ambulatory Visit (HOSPITAL_BASED_OUTPATIENT_CLINIC_OR_DEPARTMENT_OTHER): Payer: Managed Care, Other (non HMO) | Admitting: Hematology & Oncology

## 2011-07-21 ENCOUNTER — Other Ambulatory Visit: Payer: Managed Care, Other (non HMO) | Admitting: Lab

## 2011-07-21 ENCOUNTER — Ambulatory Visit: Payer: Managed Care, Other (non HMO)

## 2011-07-21 ENCOUNTER — Telehealth: Payer: Self-pay | Admitting: Hematology & Oncology

## 2011-07-21 VITALS — BP 118/79 | HR 70 | Temp 97.5°F | Ht 61.0 in | Wt 161.0 lb

## 2011-07-21 DIAGNOSIS — C3410 Malignant neoplasm of upper lobe, unspecified bronchus or lung: Secondary | ICD-10-CM

## 2011-07-21 DIAGNOSIS — C3490 Malignant neoplasm of unspecified part of unspecified bronchus or lung: Secondary | ICD-10-CM

## 2011-07-21 LAB — CBC WITH DIFFERENTIAL (CANCER CENTER ONLY)
BASO#: 0.1 10*3/uL (ref 0.0–0.2)
Eosinophils Absolute: 0.1 10*3/uL (ref 0.0–0.5)
HGB: 12.4 g/dL (ref 11.6–15.9)
LYMPH%: 20.1 % (ref 14.0–48.0)
MCV: 84 fL (ref 81–101)
MONO#: 0.5 10*3/uL (ref 0.1–0.9)
Platelets: 206 10*3/uL (ref 145–400)
RBC: 4.37 10*6/uL (ref 3.70–5.32)
WBC: 6.5 10*3/uL (ref 3.9–10.0)

## 2011-07-21 MED ORDER — ALTEPLASE 2 MG IJ SOLR
2.0000 mg | Freq: Once | INTRAMUSCULAR | Status: DC | PRN
  Filled 2011-07-21: qty 2

## 2011-07-21 MED ORDER — SODIUM CHLORIDE 0.9 % IJ SOLN
10.0000 mL | INTRAMUSCULAR | Status: DC | PRN
  Administered 2011-07-21: 10 mL via INTRAVENOUS
  Filled 2011-07-21: qty 10

## 2011-07-21 MED ORDER — HEPARIN SOD (PORK) LOCK FLUSH 100 UNIT/ML IV SOLN
500.0000 [IU] | Freq: Once | INTRAVENOUS | Status: AC | PRN
  Administered 2011-07-21: 500 [IU] via INTRAVENOUS
  Filled 2011-07-21: qty 5

## 2011-07-21 NOTE — Progress Notes (Signed)
Raneshia K Stoiber presented for Portacath access and flush. Proper placement of portacath confirmed by CXR. Portacath located in the right chest wall accessed with  H 20 needle. Clean, Dry and Intact Good blood return present. Portacath flushed with 20ml NS and 500U/5ml Heparin per protocol and needle removed intact. Procedure without incident. Patient tolerated procedure well.   

## 2011-07-21 NOTE — Progress Notes (Signed)
This office note has been dictated.

## 2011-07-21 NOTE — Telephone Encounter (Signed)
Mailed July schedule to patient, she is aware of appointments

## 2011-07-22 LAB — LACTATE DEHYDROGENASE: LDH: 163 U/L (ref 94–250)

## 2011-07-22 LAB — COMPREHENSIVE METABOLIC PANEL
Albumin: 4.2 g/dL (ref 3.5–5.2)
BUN: 14 mg/dL (ref 6–23)
CO2: 26 mEq/L (ref 19–32)
Glucose, Bld: 83 mg/dL (ref 70–99)
Potassium: 3.6 mEq/L (ref 3.5–5.3)
Sodium: 140 mEq/L (ref 135–145)
Total Bilirubin: 0.4 mg/dL (ref 0.3–1.2)
Total Protein: 6.9 g/dL (ref 6.0–8.3)

## 2011-07-22 LAB — VITAMIN D 25 HYDROXY (VIT D DEFICIENCY, FRACTURES): Vit D, 25-Hydroxy: 51 ng/mL (ref 30–89)

## 2011-07-22 NOTE — Progress Notes (Signed)
CC:   Kara Barre, MD Carlton Adam, MD Tanvir A. Chodri, M.D. Garey Ham, MD  DIAGNOSIS:  Locally recurrent adenocarcinoma of lung.  CURRENT THERAPY:  The patient is status post radiation therapy at [facility].  INTERIM HISTORY:  [name] comes in for followup.  She is doing well. She finished her radiation therapy probably about 6 weeks or so ago. She tolerated this well.  She had a little bit of dysphagia.  She has gotten through this.  She had a little bit of a cough.  There is no dyspnea.  She has had no nausea or vomiting.  We did go ahead and repeat a CT scan on her.  This was done on 07/13/2011.  The CT scan showed decrease in the left upper lobe nodule, now measures 7 x 4 mm.  PHYSICAL EXAMINATION:  This is a well-developed, well-nourished white female in no obvious distress.  Vital signs:  97.5, pulse 70, respiratory rate 18, blood pressure 118/79.  Weight is 161.  Head and neck:  Normocephalic, atraumatic skull.  There are no ocular or oral lesions.  There are no palpable cervical or supraclavicular lymph nodes. Lungs:  Clear bilaterally.  Cardiac: Regular rate and rhythm with a normal S1, S2.  There are no murmurs, rubs or bruits.  Abdomen: Soft with good bowel sounds.  There is no palpable abdominal mass.  There is no fluid wave.  There is no palpable hepatosplenomegaly. Back: Well- healed thoracotomy scar in the right lateral chest wall.  There is no tenderness over the spine, ribs, or hips.  Extremities:  No clubbing, cyanosis or edema.  Neurologic:  No focal neurological deficits.  LABORATORY STUDIES:  White cell count is 6.5, hemoglobin 12.4, hematocrit 36.9, platelet count 206.  IMPRESSION:  [name] is a 61 year old white female with history of locally recurrent adenocarcinoma of the lung.  This is her 2nd recurrence.  For now, we will just plan to follow her along.  We will probably repeat another  CT scan in about 3 months.  I will have her come  back and see [place] in 6 weeks for followup.  The patient does have a Port-A-Cath in.  We will go ahead and flush that today.    ______________________________ Josph Macho, M.D. PRE/MEDQ  D:  07/21/2011  T:  07/22/2011  Job:  [id number]

## 2011-08-29 IMAGING — CT CT CHEST W/ CM
2 of 3 series · 15 of 30 positions shown, 17 images · IV contrast (75CC OMNI 300)
Comparison: CT chest of 06/29/2009 and CT chest of 11/17/2008

CLINICAL DATA: Right upper quadrant and right flank pain, former
smoker, cough, history of right lung carcinoma in 9665 with left
lung carcinoma in 7004

CT CHEST WITH CONTRAST
TECHNIQUE: Multidetector CT imaging of the chest was performed
following the standard protocol during bolus administration of
intravenous contrast.
Contrast: 75 ml 6mnipaque-RUU

[Series 3: routine chest · axial · 0.62mm/px · z∈[-258,-48]mm · 7 of 57 slices shown, 9 images]
[im 8/57  mediastinal]
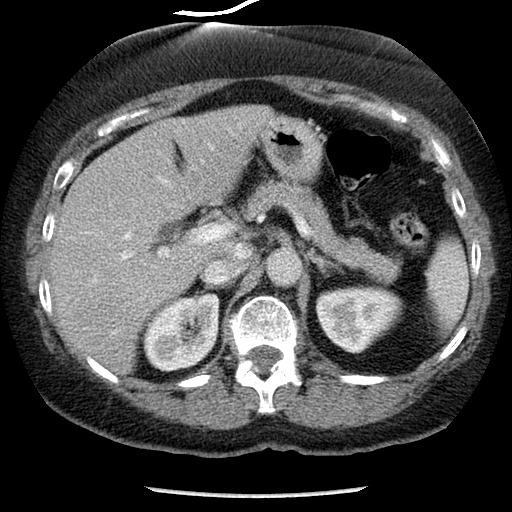
[im 8/57  lung]
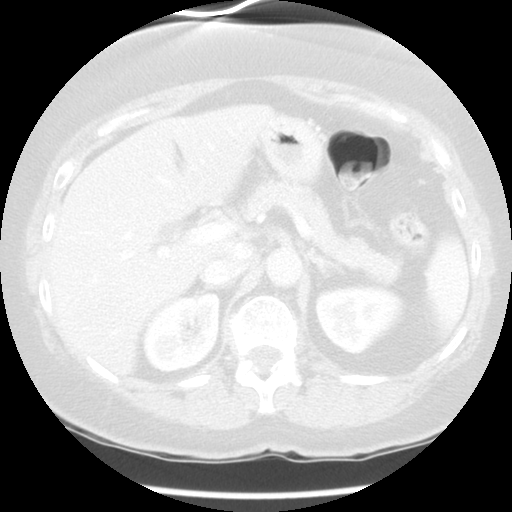
[im 15/57  lung]
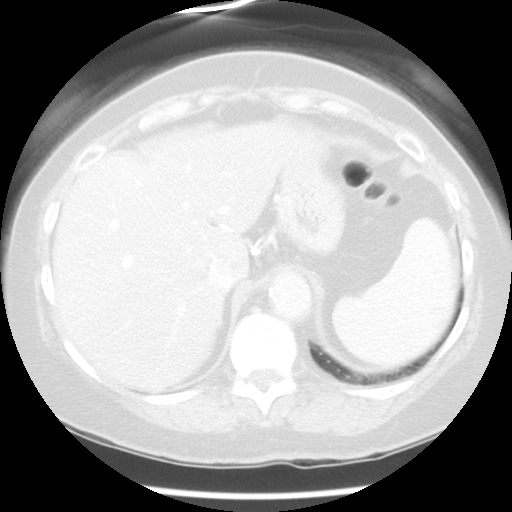
[im 22/57  lung]
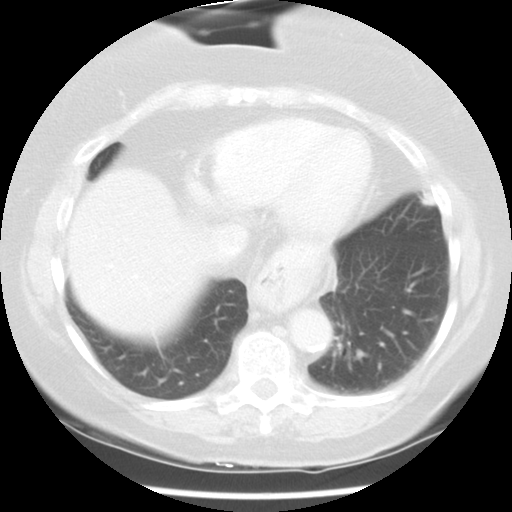
[im 29/57  lung]
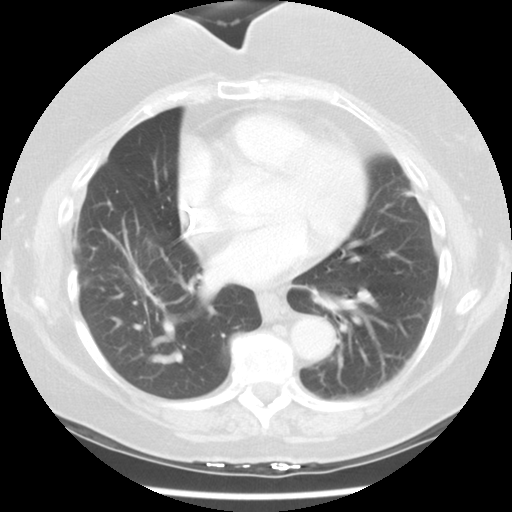
[im 36/57  mediastinal]
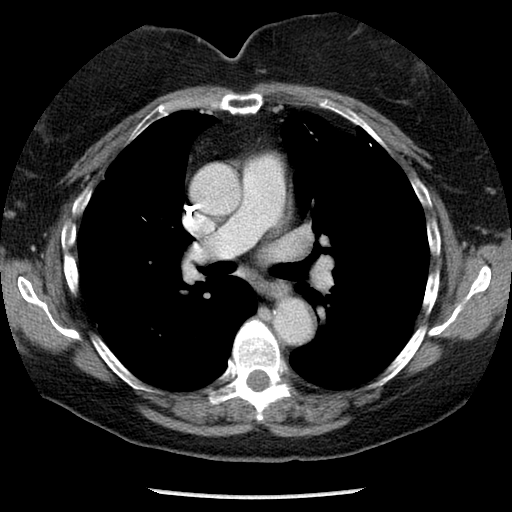
[im 36/57  lung]
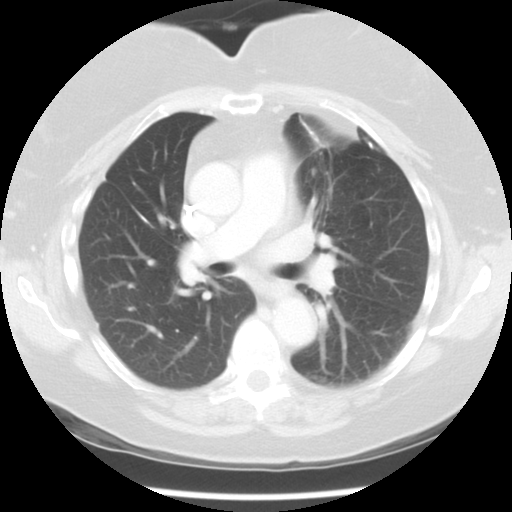
[im 43/57  lung]
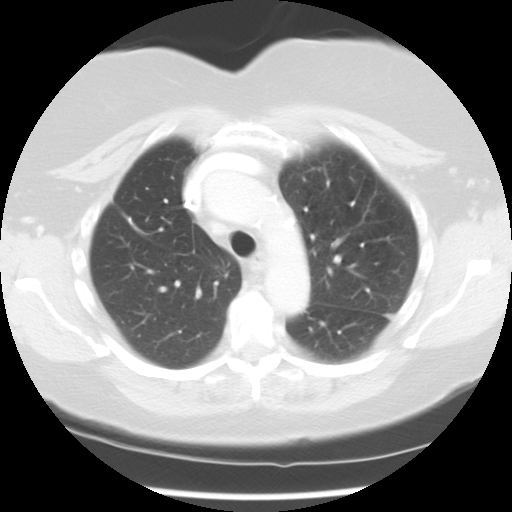
[im 50/57  lung]
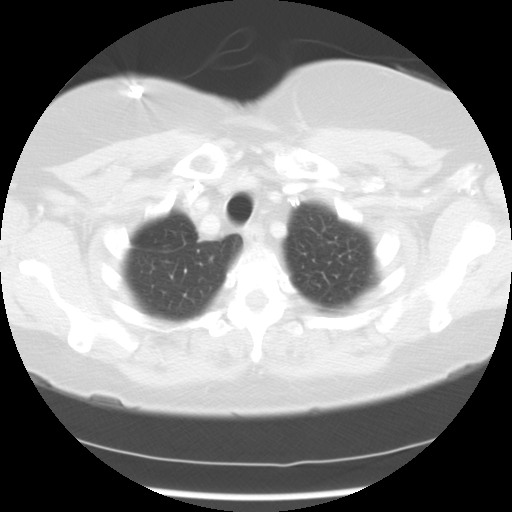

[Series 602: sagittal body · sagittal · 0.62mm/px · 8 of 129 slices shown]
[im 15/129  mediastinal]
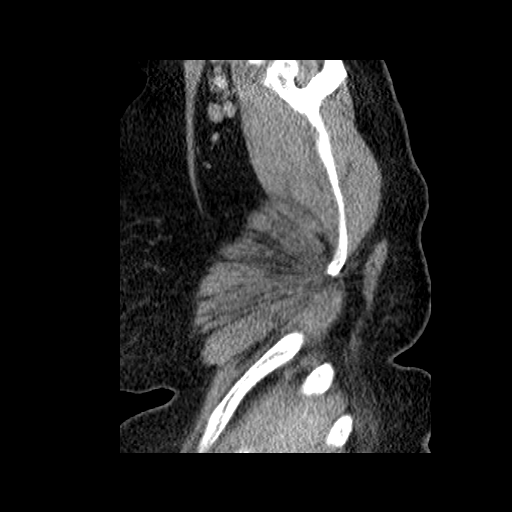
[im 29/129  mediastinal]
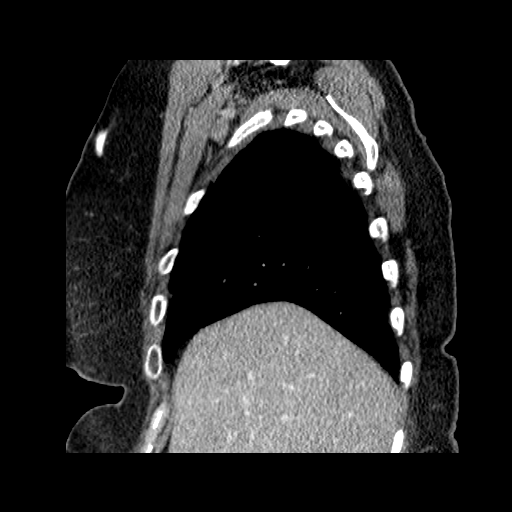
[im 43/129  mediastinal]
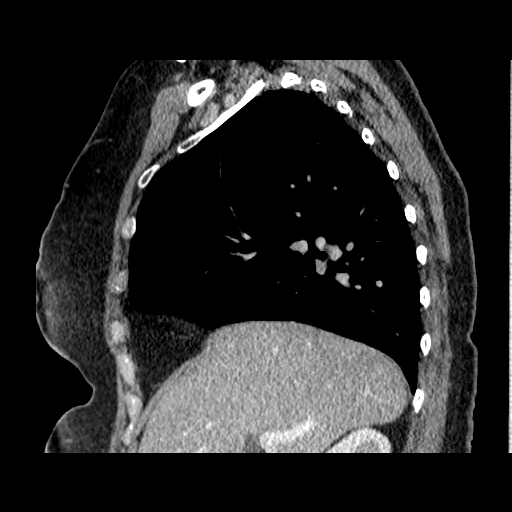
[im 57/129  mediastinal]
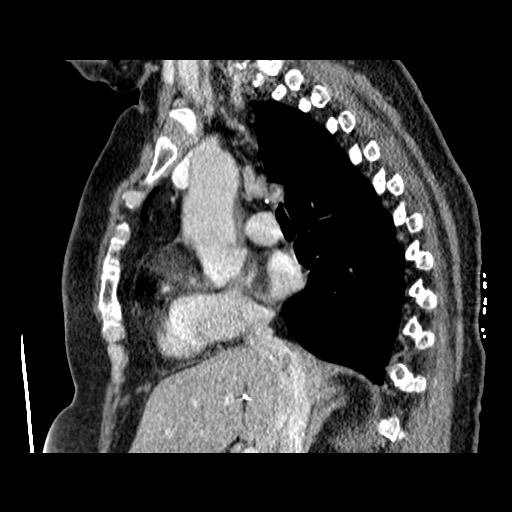
[im 72/129  mediastinal]
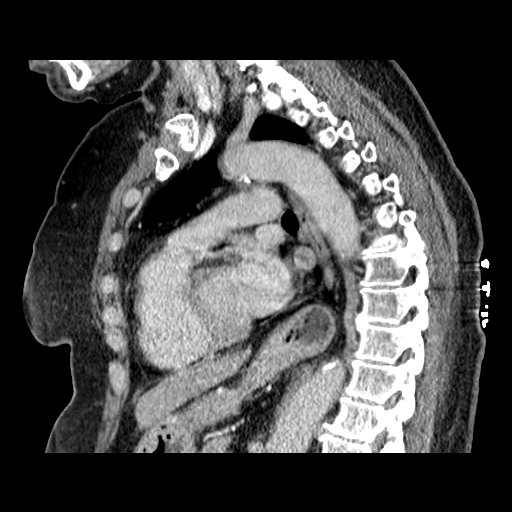
[im 86/129  mediastinal]
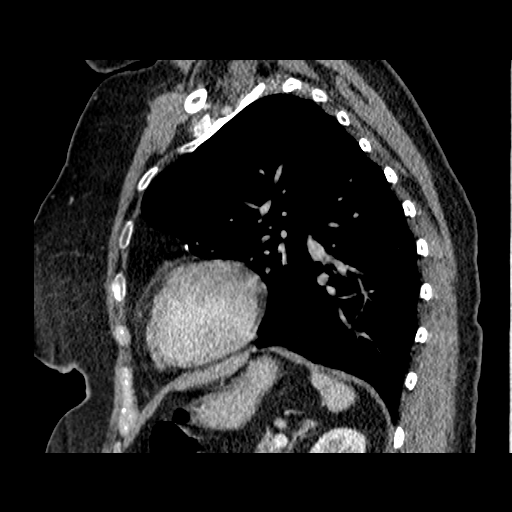
[im 100/129  mediastinal]
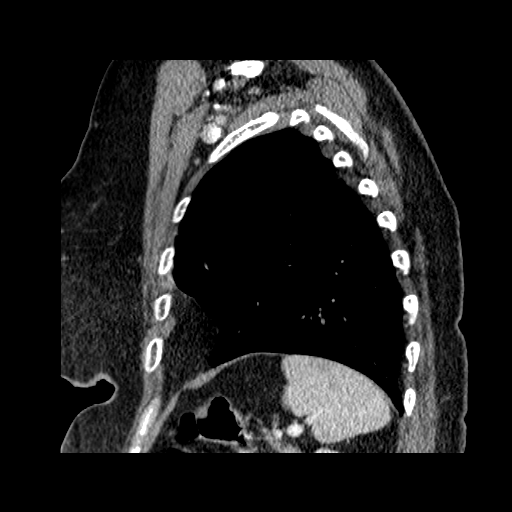
[im 114/129  mediastinal]
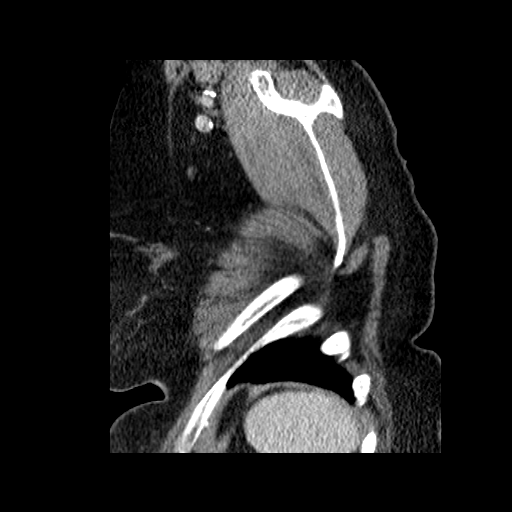

[15 of 30 positions shown; findings below may reference images not displayed]

FINDINGS: The parenchymal opacity in the right lower lobe with air
bronchograms appears completely stable and may represent scarring.
This opacity does appear to be at the site of a previous right
lower lobe lung nodule when compared to the CT of 11/09/2007.  The
nodular opacity peripherally in the lingula anterolaterally also is
stable and consistent with scarring.  No new parenchymal
abnormality is seen.  No pleural effusion is noted.

On soft tissue window images, the thyroid gland is normal in size.
Right-sided Port-A-Cath remains.  The pulmonary arteries and
thoracic aorta opacify with no significant abnormality noted.  No
mediastinal or hilar adenopathy is noted.  Small hiatal hernia
appears stable. No acute bony abnormality is seen.  There is a
slight thoracic kyphosis present.  A nonunited fracture of the
right lateral seventh rib is noted.
IMPRESSION: 1.  Stable CT of the chest with apparent scarring in the right
lower lobe and in the lingula.  No new abnormality is seen.
2.  Stable small hiatal hernia.

## 2011-09-01 ENCOUNTER — Ambulatory Visit (HOSPITAL_BASED_OUTPATIENT_CLINIC_OR_DEPARTMENT_OTHER): Payer: Managed Care, Other (non HMO) | Admitting: Hematology & Oncology

## 2011-09-01 ENCOUNTER — Other Ambulatory Visit: Payer: Managed Care, Other (non HMO) | Admitting: Lab

## 2011-09-01 ENCOUNTER — Ambulatory Visit: Payer: Managed Care, Other (non HMO)

## 2011-09-01 ENCOUNTER — Other Ambulatory Visit (HOSPITAL_BASED_OUTPATIENT_CLINIC_OR_DEPARTMENT_OTHER): Payer: Managed Care, Other (non HMO) | Admitting: Lab

## 2011-09-01 ENCOUNTER — Ambulatory Visit: Payer: Managed Care, Other (non HMO) | Admitting: Hematology & Oncology

## 2011-09-01 ENCOUNTER — Ambulatory Visit (HOSPITAL_BASED_OUTPATIENT_CLINIC_OR_DEPARTMENT_OTHER)
Admission: RE | Admit: 2011-09-01 | Discharge: 2011-09-01 | Disposition: A | Discharge status: Home / Self Care | Payer: Managed Care, Other (non HMO) | Source: Ambulatory Visit | Attending: Hematology & Oncology | Admitting: Hematology & Oncology

## 2011-09-01 VITALS — BP 109/77 | HR 74 | Temp 97.6°F | Ht 61.0 in | Wt 161.0 lb

## 2011-09-01 DIAGNOSIS — M25519 Pain in unspecified shoulder: Secondary | ICD-10-CM

## 2011-09-01 DIAGNOSIS — C3410 Malignant neoplasm of upper lobe, unspecified bronchus or lung: Secondary | ICD-10-CM

## 2011-09-01 DIAGNOSIS — C3490 Malignant neoplasm of unspecified part of unspecified bronchus or lung: Secondary | ICD-10-CM

## 2011-09-01 DIAGNOSIS — M25559 Pain in unspecified hip: Secondary | ICD-10-CM | POA: Insufficient documentation

## 2011-09-01 DIAGNOSIS — M5010 Cervical disc disorder with radiculopathy, unspecified cervical region: Secondary | ICD-10-CM

## 2011-09-01 LAB — COMPREHENSIVE METABOLIC PANEL
ALT: 15 U/L (ref 0–35)
AST: 18 U/L (ref 0–37)
Albumin: 4.5 g/dL (ref 3.5–5.2)
Calcium: 9.6 mg/dL (ref 8.4–10.5)
Chloride: 101 mEq/L (ref 96–112)
Creatinine, Ser: 0.67 mg/dL (ref 0.50–1.10)
Potassium: 3.5 mEq/L (ref 3.5–5.3)
Sodium: 138 mEq/L (ref 135–145)

## 2011-09-01 LAB — CBC WITH DIFFERENTIAL (CANCER CENTER ONLY)
BASO%: 0.9 % (ref 0.0–2.0)
EOS%: 2 % (ref 0.0–7.0)
LYMPH#: 1.7 10*3/uL (ref 0.9–3.3)
MCHC: 34.1 g/dL (ref 32.0–36.0)
NEUT#: 4.2 10*3/uL (ref 1.5–6.5)
RDW: 15.9 % — ABNORMAL HIGH (ref 11.1–15.7)

## 2011-09-01 MED ORDER — HEPARIN SOD (PORK) LOCK FLUSH 100 UNIT/ML IV SOLN
500.0000 [IU] | Freq: Once | INTRAVENOUS | Status: AC | PRN
  Administered 2011-09-01: 500 [IU] via INTRAVENOUS
  Filled 2011-09-01: qty 5

## 2011-09-01 MED ORDER — SODIUM CHLORIDE 0.9 % IJ SOLN
10.0000 mL | INTRAMUSCULAR | Status: DC | PRN
  Administered 2011-09-01: 10 mL via INTRAVENOUS
  Filled 2011-09-01: qty 10

## 2011-09-01 NOTE — Patient Instructions (Signed)

## 2011-09-01 NOTE — Progress Notes (Signed)
This office note has been dictated.

## 2011-09-01 NOTE — Progress Notes (Signed)
CC:   Keturah Barre, MD Carlton Adam, MD Tanvir A. Chodri, M.D. Garey Ham, MD  DIAGNOSIS:  Locally recurrent adenocarcinoma of the lung.  CURRENT THERAPY:  Observation.  INTERIM HISTORY:  Ms. Douds comes in for followup.  Her problem now is that she has pain in her left shoulder.  There is some radiation down to her left arm.  She has had neck problems before.  I think we are going to have to get an MRI of the neck to see if there is any nerve impingement.  I also believe that we are going to have to get an MRI of the left shoulder.  She notices problems and pain when she tries to extend the shoulder backwards.  She can raise her arm okay.  I do not think this is rotator cuff issues.  We also have to make sure that there is no evidence of malignancy that might be causing problems for her.  She also has been complaining of pain in the hips.  She has had this for about a week or so.  I am not sure as to what the source of this is. Her vitamin D level was good when we checked it back in June.  She still has the pain over on her right side.  She does have a hiatal hernia.  She has had no leg swelling.  There have been no problems with bowels or bladder.  She has had no headaches.  She has had no change in vision. There has been no cough.  She has had no hemoptysis.  Her last CT scan was done back in early June.  I am not sure if her nodule was decreasing in size.  PHYSICAL EXAMINATION:  This is a well-developed, well-nourished white female in no obvious distress.  Vital signs:  Temperature of 97.6, pulse 74, respiratory 18, blood pressure 109/77.  Weight is 161.  Head and neck:  Normocephalic, atraumatic skull.  There are no ocular or oral lesions.  There are no palpable cervical or supraclavicular lymph nodes. Lungs:  Clear to percussion and auscultation bilaterally.  Cardiac: Regular rate and rhythm with a normal S1 and S2.  There are no murmurs, rubs or bruits.  Abdomen:   Soft with good bowel sounds.  There is no palpable abdominal mass.  There is no fluid wave.  There is no palpable hepatosplenomegaly.  Back:  Some slight tenderness over the cervical spine.  There may be some slight spasms in the left paravertebral cervical muscles.  Extremities:  Does show some decreased range motion of the left shoulder.  She has good abduction of the shoulder.  She does have pain with posterior extension of the left shoulder.  She has good strength in her hands.  Neurological:  Shows no focal neurological deficits.  LABORATORY STUDIES:  White cell count is 6.6, hemoglobin 12.6, hematocrit 36.9, platelet count 218.  IMPRESSION:  Ms. Klausner is a 61 year old female with history of locally recurrent adenocarcinoma of the lung.  She has had radiation therapy for this recurrence.  She is out now from her radiation by about 5 months.  I am not sure what the problem is with the left shoulder.  Again, I think an MRI of the neck and MRI of the left shoulder would be necessary.  I think we can get the most information from these scans.  I think plain films of the left hip would be helpful.  We will get those today.  She does  have another CT of the chest to help follow up with the radiation.  We will get this in September.  She gets her Port-A-Cath flushed today.  I will plan to see her back after she has her CT scan done.  We will let her know what the MRIs show.    ______________________________ Josph Macho, M.D. PRE/MEDQ  D:  09/01/2011  T:  09/01/2011  Job:  [id number]

## 2011-09-05 ENCOUNTER — Telehealth: Payer: Self-pay | Admitting: *Deleted

## 2011-09-06 ENCOUNTER — Other Ambulatory Visit: Payer: Managed Care, Other (non HMO)

## 2011-09-07 ENCOUNTER — Telehealth: Payer: Self-pay | Admitting: *Deleted

## 2011-09-07 NOTE — Telephone Encounter (Addendum)
Message copied by Mirian Capuchin on Wed Sep 07, 2011  4:41 PM ------      Message from: Arlan Organ R      Created: Mon Sep 05, 2011  5:29 PM       Call - hip xray is normal!!  May need to see orthopedist if pain persists!!  May have bursitis that could get better with steroid injection.  Pete Pt notified of above message above.  States she is going to work with her chiropractic doctor and see if he can help her.

## 2011-09-20 IMAGING — PT NM PET TUM IMG RESTAG (PS) SKULL BASE T - THIGH
6 series · 25 of 25 positions shown · non-contrast
Comparison: 02/19/2008 and  06/29/2009.

CLINICAL DATA: Subsequent treatment strategy for lung cancer.

NUCLEAR MEDICINE PET CT INITIAL (PI) SKULL BASE TO THIGH
TECHNIQUE: 17.5 the mCi F-18 FDG was injected intravenously via
the left antecubital fossa.  Full-ring PET imaging was performed
from the skull base through the mid-thighs 63  minutes after
injection.  CT data was obtained and used for attenuation
correction and anatomic localization only.  (This was not acquired
as a diagnostic CT examination.)
Fasting Blood Glucose:  92

[Series 1: pet ac · axial · 3.3mm · 4.69mm/px · z∈[-736,-10]mm · 5 of 223 slices shown]
[im 1/223]
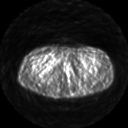
[im 56/223]
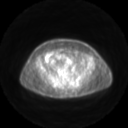
[im 112/223]
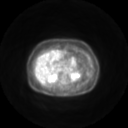
[im 167/223]
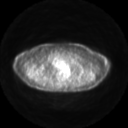
[im 223/223]
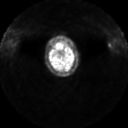

[Series 2: pet nac · axial · 3.3mm · 4.69mm/px · z∈[-736,-10]mm · 6 of 223 slices shown]
[im 1/223]
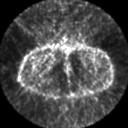
[im 45/223]
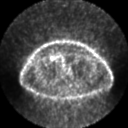
[im 89/223]
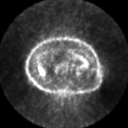
[im 134/223]
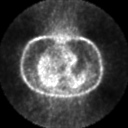
[im 178/223]
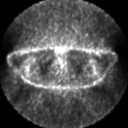
[im 223/223]
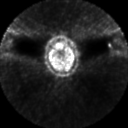

[Series 2: ct images · axial · 3.8mm · 0.98mm/px · z∈[-736,-10]mm · 5 of 223 slices shown]
[im 1/223]
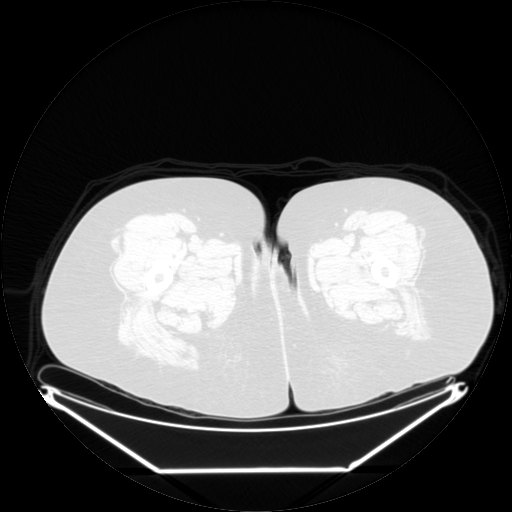
[im 56/223]
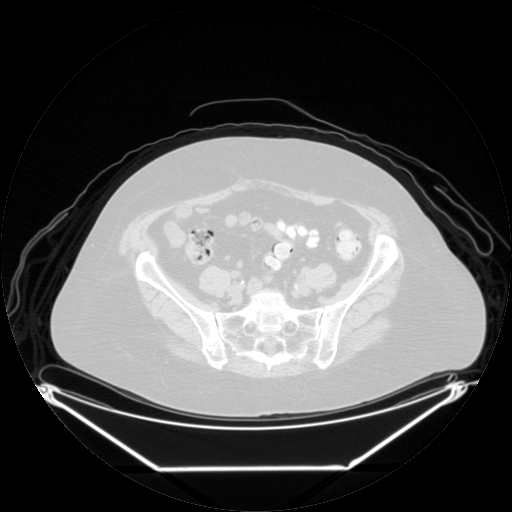
[im 112/223]
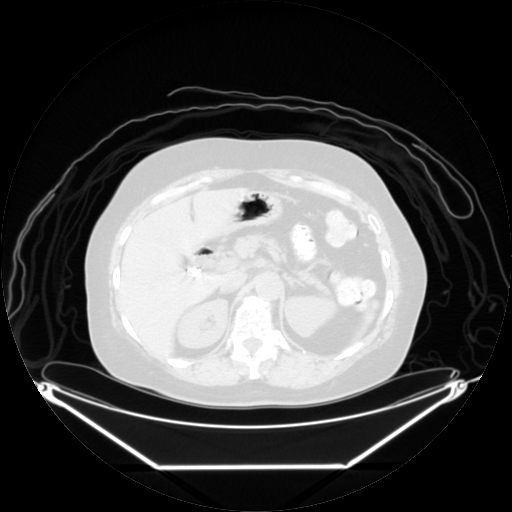
[im 167/223]
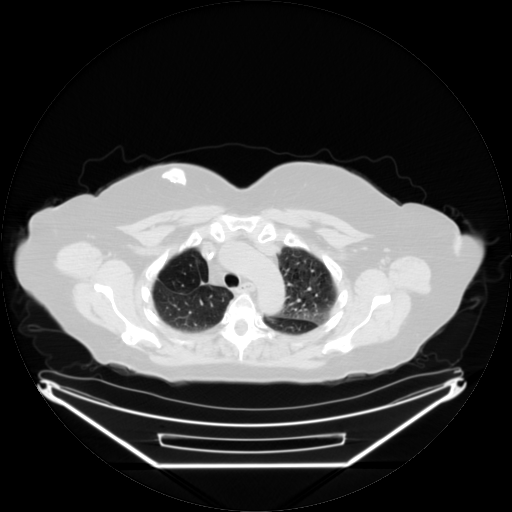
[im 223/223]
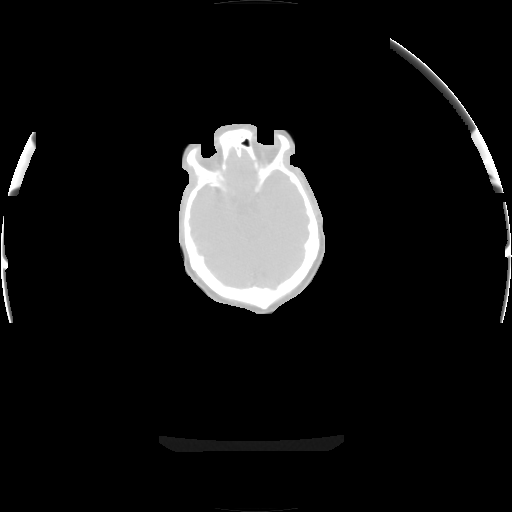

[Series 123: mip · coronal · 3.3mm · 4.69mm/px · 1 of 30 slices shown]
[im 1/30]
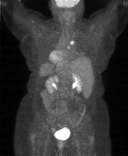

[Series 151: reformatted · axial · 3.3mm · 3.91mm/px · z∈[-736,-10]mm · 6 of 221 slices shown (1 of 2)]
[im 1/221]
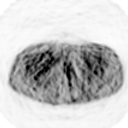
[im 45/221]
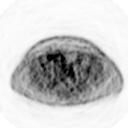
[im 89/221]
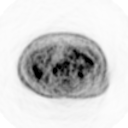
[im 133/221]
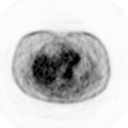
[im 177/221]
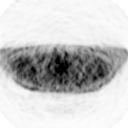
[im 221/221]
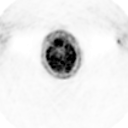

[Series 153: reformatted · coronal · 4.7mm · 5.83mm/px · 2 of 64 slices shown (2 of 2)]
[im 1/64]
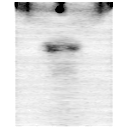
[im 64/64]
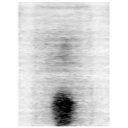

[25 of 25 positions shown; findings below may reference images not displayed]

FINDINGS: No hypermetabolic lymph nodes within the soft tissues of the neck.

There is no hypermetabolic supraclavicular or axillary lymph nodes.

Right paratracheal lymph node measures 0.9 cm and has an SUV max
equal to 13.3. On the previous exam this lymph node measured 0.6 cm
and had an SUV max equal to the 9.6.

Increased uptake within the subcarinal lymph node is again noted.
The SUV max is equal to 7.5, image 67.  This is compared with
previously.

Large hiatal hernia, stable. Previously noted tumor within the
right lower lobe is again identified measuring 3.2 cm.  The SUV max
within this area is equal to 2.7.

No abnormal FDG uptake within the liver.

The spleen appears normal.

The pancreas appears normal.

Both adrenal glands are normal.

There is no hypermetabolic upper abdominal or pelvic adenopathy.

The review of the visualized bony structures is negative for
hypermetabolic bone metastases.
IMPRESSION: 1.  Examination is positive for persistent hypermetabolic tumor
within the right paratracheal and subcarinal region.  When compared
with the prior exam the degree of FDG uptake within these areas has
increased.
2.  Right lower lobe tumor is not significantly changed in size
from 06/29/2009.  Low level FDG uptake within this area is
nonspecific and may reflect inflammatory change secondary to
treatment.  Residual/recurrent tumor in this area is not excluded.

## 2011-10-01 IMAGING — CR DG CHEST 2V
2 series · 2 of 2 positions shown · non-contrast
Comparison: CT of the chest of 10/14/2009

CLINICAL DATA: The lung lesion, smoking history, follow-up

CHEST - 2 VIEW

[view not recorded (1 of 2)]
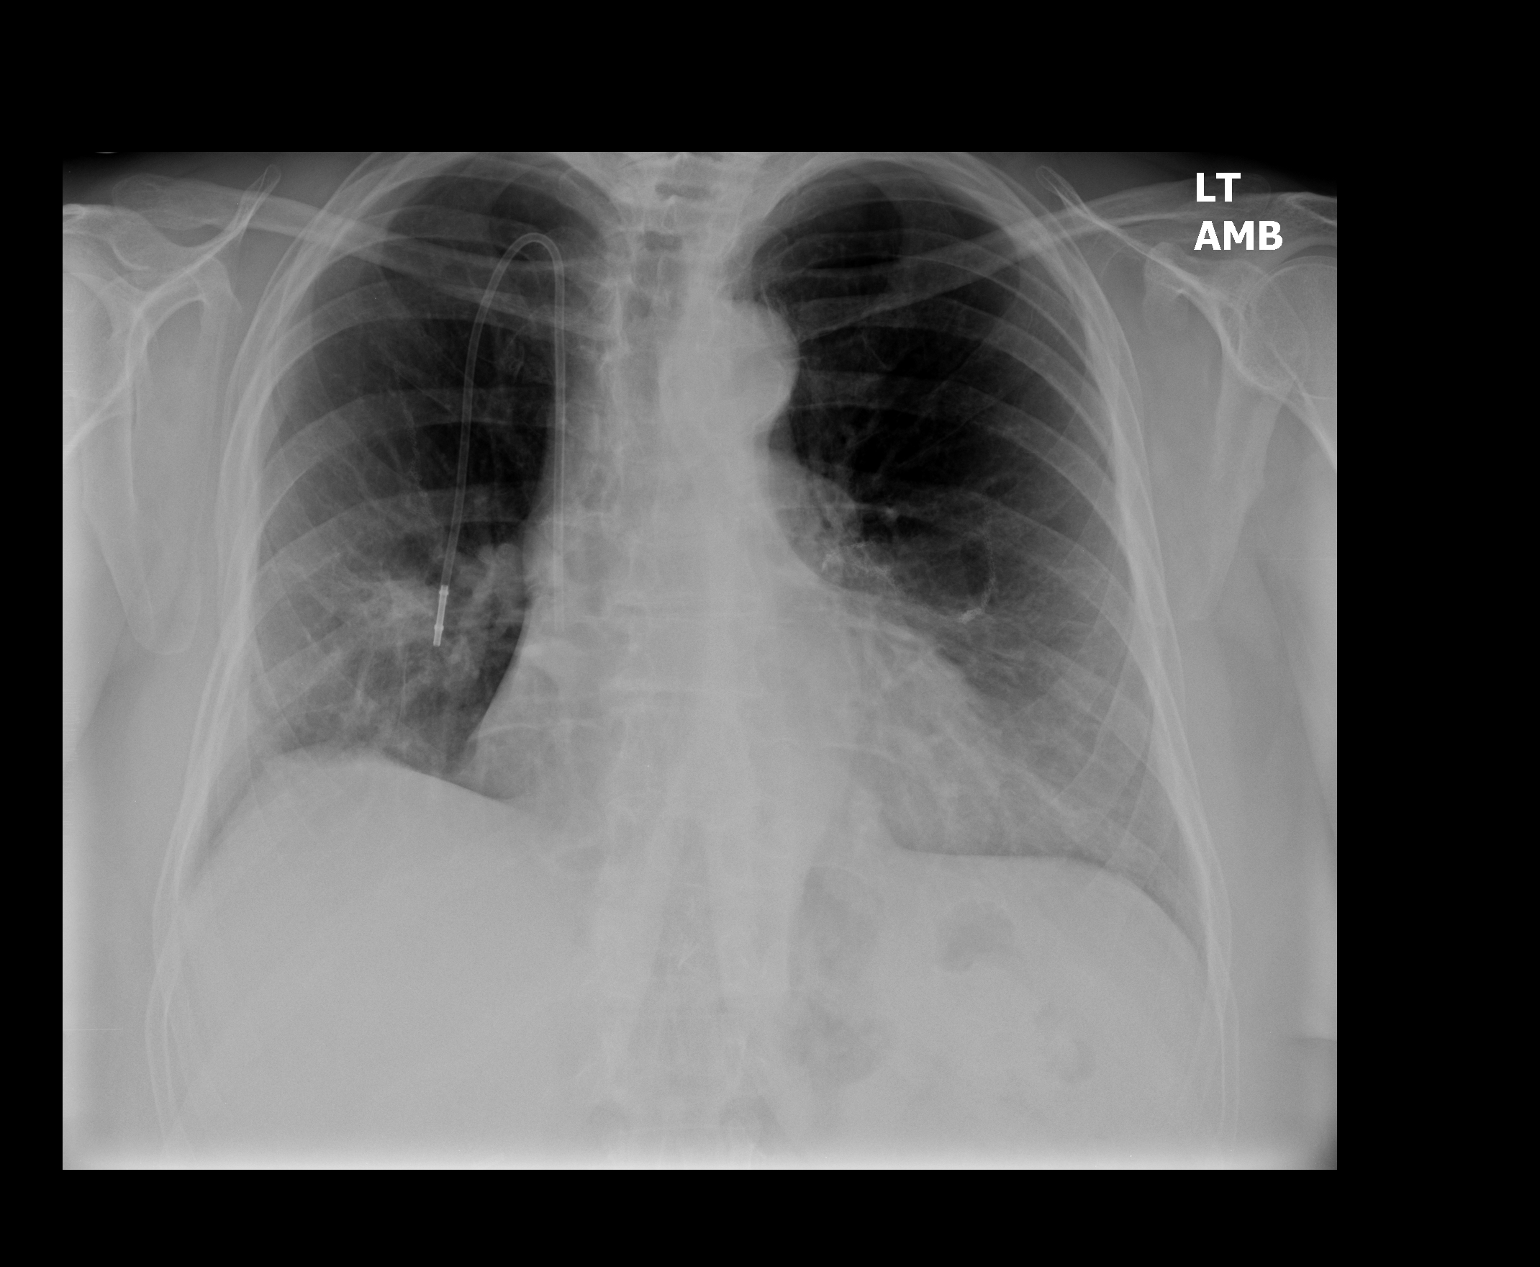

[view not recorded (2 of 2)]
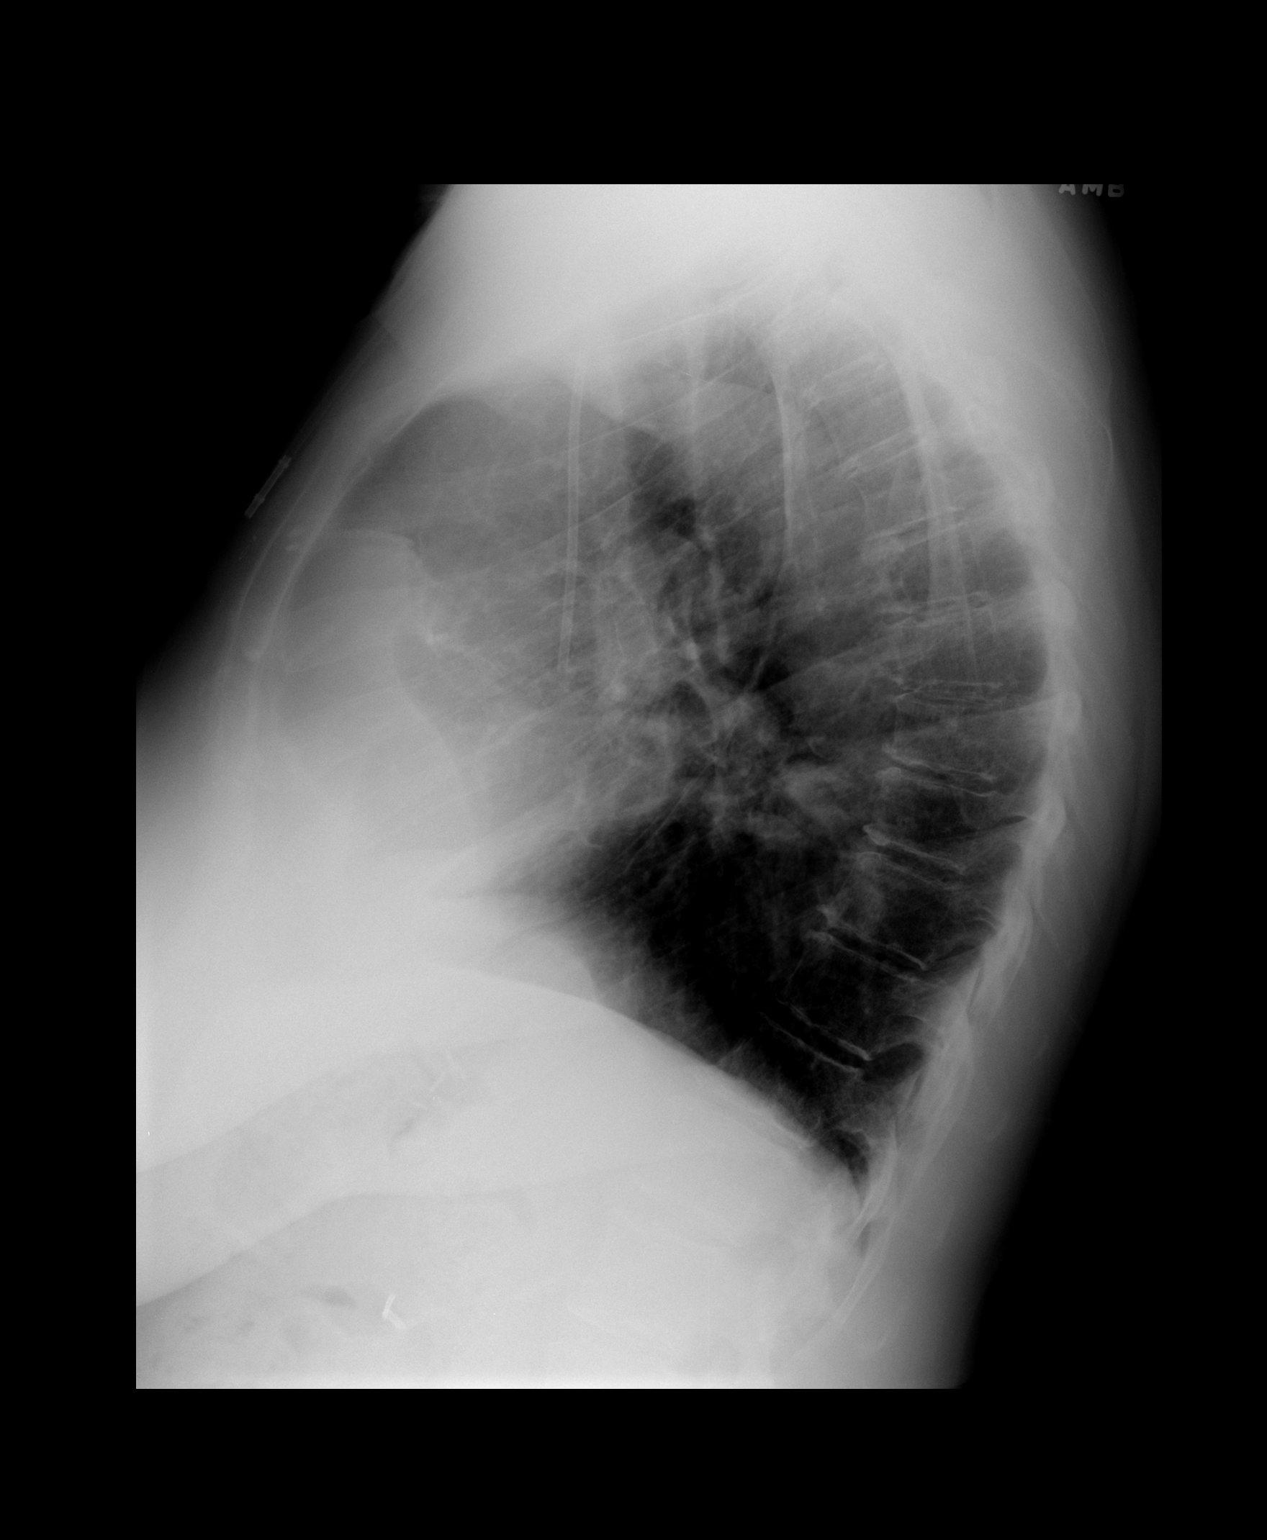

[2 of 2 positions shown; findings below may reference images not displayed]

FINDINGS: The parenchymal lesion in the right lower lobe is again
noted and overlain by the right-sided Port-A-Cath.  The tip of the
Port-A-Cath is in the lower SVC near the expected right atrial
junction.  No other parenchymal lesion is seen and no effusion is
noted.  Cardiomegaly is stable.  No bony abnormality is seen.
IMPRESSION: No change in right lower lobe parenchymal lesion.  Port-A-Cath tip
near SVC/RA junction.

## 2011-10-05 ENCOUNTER — Encounter: Payer: Self-pay | Admitting: *Deleted

## 2011-10-17 ENCOUNTER — Inpatient Hospital Stay: Admission: RE | Admit: 2011-10-17 | Payer: Managed Care, Other (non HMO) | Source: Ambulatory Visit

## 2011-10-20 ENCOUNTER — Other Ambulatory Visit: Payer: Managed Care, Other (non HMO)

## 2011-10-20 ENCOUNTER — Ambulatory Visit
Admission: RE | Admit: 2011-10-20 | Discharge: 2011-10-20 | Disposition: A | Discharge status: Home / Self Care | Payer: Managed Care, Other (non HMO) | Source: Ambulatory Visit | Attending: Hematology & Oncology | Admitting: Hematology & Oncology

## 2011-10-20 DIAGNOSIS — C3490 Malignant neoplasm of unspecified part of unspecified bronchus or lung: Secondary | ICD-10-CM

## 2011-10-20 MED ORDER — IOHEXOL 300 MG/ML  SOLN
75.0000 mL | Freq: Once | INTRAMUSCULAR | Status: AC | PRN
  Administered 2011-10-20: 75 mL via INTRAVENOUS

## 2011-10-24 ENCOUNTER — Ambulatory Visit (HOSPITAL_BASED_OUTPATIENT_CLINIC_OR_DEPARTMENT_OTHER): Payer: Managed Care, Other (non HMO) | Admitting: Lab

## 2011-10-24 ENCOUNTER — Ambulatory Visit: Payer: Managed Care, Other (non HMO)

## 2011-10-24 ENCOUNTER — Other Ambulatory Visit: Payer: Self-pay | Admitting: *Deleted

## 2011-10-24 ENCOUNTER — Ambulatory Visit (HOSPITAL_BASED_OUTPATIENT_CLINIC_OR_DEPARTMENT_OTHER): Payer: Managed Care, Other (non HMO) | Admitting: Hematology & Oncology

## 2011-10-24 DIAGNOSIS — C3410 Malignant neoplasm of upper lobe, unspecified bronchus or lung: Secondary | ICD-10-CM

## 2011-10-24 DIAGNOSIS — C3490 Malignant neoplasm of unspecified part of unspecified bronchus or lung: Secondary | ICD-10-CM

## 2011-10-24 DIAGNOSIS — G4762 Sleep related leg cramps: Secondary | ICD-10-CM

## 2011-10-24 LAB — CBC WITH DIFFERENTIAL (CANCER CENTER ONLY)
EOS%: 3.9 % (ref 0.0–7.0)
Eosinophils Absolute: 0.2 10*3/uL (ref 0.0–0.5)
LYMPH#: 1.4 10*3/uL (ref 0.9–3.3)
MCH: 28.9 pg (ref 26.0–34.0)
MONO%: 7.9 % (ref 0.0–13.0)
NEUT#: 3.5 10*3/uL (ref 1.5–6.5)
Platelets: 203 10*3/uL (ref 145–400)
RBC: 4.22 10*6/uL (ref 3.70–5.32)

## 2011-10-24 LAB — COMPREHENSIVE METABOLIC PANEL
AST: 18 U/L (ref 0–37)
Albumin: 4.4 g/dL (ref 3.5–5.2)
Alkaline Phosphatase: 70 U/L (ref 39–117)
Calcium: 9.4 mg/dL (ref 8.4–10.5)
Chloride: 102 mEq/L (ref 96–112)
Glucose, Bld: 83 mg/dL (ref 70–99)
Potassium: 3.7 mEq/L (ref 3.5–5.3)
Sodium: 140 mEq/L (ref 135–145)
Total Protein: 7.2 g/dL (ref 6.0–8.3)

## 2011-10-24 MED ORDER — HEPARIN SOD (PORK) LOCK FLUSH 100 UNIT/ML IV SOLN
500.0000 [IU] | Freq: Once | INTRAVENOUS | Status: AC | PRN
  Administered 2011-10-24: 500 [IU] via INTRAVENOUS
  Filled 2011-10-24: qty 5

## 2011-10-24 MED ORDER — SODIUM CHLORIDE 0.9 % IJ SOLN
10.0000 mL | INTRAMUSCULAR | Status: DC | PRN
  Administered 2011-10-24: 10 mL via INTRAVENOUS
  Filled 2011-10-24: qty 10

## 2011-10-24 MED ORDER — ALTEPLASE 2 MG IJ SOLR
2.0000 mg | Freq: Once | INTRAMUSCULAR | Status: DC | PRN
  Filled 2011-10-24: qty 2

## 2011-10-24 MED ORDER — MAGNESIUM ASPARTATE HCL 615 MG PO TBEC: 2.0000 | DELAYED_RELEASE_TABLET | Freq: Every evening | ORAL | Status: DC | PRN

## 2011-10-24 NOTE — Telephone Encounter (Signed)
E-prescribed Maginex tablets to [facility] on file per Dr Myna Hidalgo for nocturnal leg cramps. He forgot to give it to her while she was here. Pt made aware.

## 2011-10-24 NOTE — Progress Notes (Signed)
This office note has been dictated.

## 2011-10-25 NOTE — Progress Notes (Signed)
CC:   Keturah Barre, MD Carlton Adam, MD Tanvir A. Chodri, M.D. Garey Ham, MD  DIAGNOSIS:  Locally-recurrent adenocarcinoma of the lung.  CURRENT THERAPY:  Observation.  INTERIM HISTORY:  Ms. Fenrich comes in for her followup.  She is doing quite well.  She feels a little bit better.  She still has her right- sided and upper abdominal pain.  We did go ahead and do a CT scan on her.  This was done last week.  The CT scan did not show any evidence of recurrent disease.  She did have a moderate, I think a hiatal hernia.  She had some radiation changes in the upper lungs.  She had no obvious lytic or blastic lesions with her bones.  Her appetite is good.  She has had no problem with bowels or bladder. She has had no leg or arm swelling.  There has been no headache.  She has had a history of cerebral aneurysm.  This is being followed at [facility].  PHYSICAL EXAMINATION:  This is a well-developed, well-nourished white female in no obvious distress.  Vital signs:  Temperature of 98, pulse 76, respiratory rate 18, blood pressure 113/73.  Weight is 146.  Head and neck:  Normocephalic, atraumatic skull.  There are no ocular or oral lesions.  There are no palpable cervical or supraclavicular lymph nodes. Lungs:  Clear bilaterally.  Cardiac:  Regular rate and rhythm with a normal S1 and S2.  There are no murmurs, rubs or bruits.  Abdomen:  Soft with good bowel sounds.  There is no palpable abdominal mass.  There is no palpable hepatosplenomegaly.  Back:  No tenderness over the spine, ribs, or hips.  Neurologic:  No focal neurological deficits.  LABORATORY STUDIES:  White cell count 5.7, hemoglobin 12.2, hematocrit 36, platelet count 203.  Electrolytes all are within normal limits.  Calcium is 9.4 with an albumin of 4.4.  LFTs are normal.  IMPRESSION:  Ms. Dishman is a 61 year old white female locally-recurrent adenocarcinoma of the lung.  Of note, her tumor is negative for EGFR  and ALK mutations.  We will just plan for another CT scan in about 3 months.  Until then, I do not see that we need to do anything else with her.  We will go ahead and get her Port-A-Cath flushed today.  I will see her back in 6 weeks.    ______________________________ Josph Macho, M.D. PRE/MEDQ  D:  10/24/2011  T:  10/25/2011  Job:  [id number]

## 2011-12-05 ENCOUNTER — Other Ambulatory Visit (HOSPITAL_BASED_OUTPATIENT_CLINIC_OR_DEPARTMENT_OTHER): Payer: Managed Care, Other (non HMO) | Admitting: Lab

## 2011-12-05 ENCOUNTER — Telehealth: Payer: Self-pay | Admitting: Hematology & Oncology

## 2011-12-05 ENCOUNTER — Ambulatory Visit (HOSPITAL_BASED_OUTPATIENT_CLINIC_OR_DEPARTMENT_OTHER): Payer: Managed Care, Other (non HMO) | Admitting: Hematology & Oncology

## 2011-12-05 ENCOUNTER — Ambulatory Visit (HOSPITAL_BASED_OUTPATIENT_CLINIC_OR_DEPARTMENT_OTHER): Payer: Managed Care, Other (non HMO)

## 2011-12-05 VITALS — BP 109/78 | HR 86 | Temp 98.2°F | Resp 16 | Ht 61.0 in | Wt 163.0 lb

## 2011-12-05 DIAGNOSIS — C3490 Malignant neoplasm of unspecified part of unspecified bronchus or lung: Secondary | ICD-10-CM

## 2011-12-05 DIAGNOSIS — C3410 Malignant neoplasm of upper lobe, unspecified bronchus or lung: Secondary | ICD-10-CM

## 2011-12-05 DIAGNOSIS — Z23 Encounter for immunization: Secondary | ICD-10-CM

## 2011-12-05 LAB — CBC WITH DIFFERENTIAL (CANCER CENTER ONLY)
BASO%: 0.8 % (ref 0.0–2.0)
EOS%: 3.3 % (ref 0.0–7.0)
MCH: 27.8 pg (ref 26.0–34.0)
MCHC: 32.8 g/dL (ref 32.0–36.0)
MONO%: 7.4 % (ref 0.0–13.0)
NEUT#: 4.2 10*3/uL (ref 1.5–6.5)
Platelets: 198 10*3/uL (ref 145–400)

## 2011-12-05 LAB — COMPREHENSIVE METABOLIC PANEL
ALT: 12 U/L (ref 0–35)
AST: 17 U/L (ref 0–37)
Albumin: 4.5 g/dL (ref 3.5–5.2)
Alkaline Phosphatase: 68 U/L (ref 39–117)
Potassium: 3.6 mEq/L (ref 3.5–5.3)
Sodium: 141 mEq/L (ref 135–145)
Total Protein: 7.3 g/dL (ref 6.0–8.3)

## 2011-12-05 MED ORDER — ALTEPLASE 2 MG IJ SOLR
2.0000 mg | Freq: Once | INTRAMUSCULAR | Status: DC | PRN
  Filled 2011-12-05: qty 2

## 2011-12-05 MED ORDER — SODIUM CHLORIDE 0.9 % IJ SOLN
10.0000 mL | INTRAMUSCULAR | Status: DC | PRN
  Administered 2011-12-05: 10 mL via INTRAVENOUS
  Filled 2011-12-05: qty 10

## 2011-12-05 MED ORDER — INFLUENZA VIRUS VACC SPLIT PF IM SUSP
0.5000 mL | Freq: Once | INTRAMUSCULAR | Status: AC
  Administered 2011-12-05: 0.5 mL via INTRAMUSCULAR
  Filled 2011-12-05: qty 0.5

## 2011-12-05 MED ORDER — HEPARIN SOD (PORK) LOCK FLUSH 100 UNIT/ML IV SOLN
500.0000 [IU] | Freq: Once | INTRAVENOUS | Status: AC | PRN
  Administered 2011-12-05: 500 [IU] via INTRAVENOUS
  Filled 2011-12-05: qty 5

## 2011-12-05 NOTE — Progress Notes (Signed)
This office note has been dictated.

## 2011-12-05 NOTE — Telephone Encounter (Signed)
Left pt message with 12-9 appointment

## 2011-12-06 NOTE — Progress Notes (Signed)
CC:   Keturah Barre, MD Carlton Adam, MD Garey Ham, MD Tanvir A. Chodri, M.D.  DIAGNOSIS:  Locally recurrent adenocarcinoma of the lung, in clinical remission.  CURRENT THERAPY:  Observation.  INTERIM HISTORY:  [name] comes in for followup.  As expected, she is quite emotional today.  Our mutual patient and friend is in hospice now. This has been very tough on [name].  Thankfully, her lung cancer has not had any issues with respect to recurrence.  We did repeat a CT scan on her.  She does have a moderate hiatal hernia.  She has some radiation changes.  No obvious recurrence is noted.  A previously noted lesion in the left I think lung has resolved.  She is going to be seeing a surgeon about the possibility of surgery for this hiatal hernia.  She is complaining of chest discomfort.  She also has some left hip issues.  She apparently fell at church.  She fell on her left side.  I do not think anything is broken.  She is walking okay. She is a little sore.  She has had no leg swelling.  There have been no rashes.  She has had no change in bowel or bladder habits.  She is a little constipated.  I her to try Senokot-S.  PHYSICAL EXAMINATION:  General:  This is a well-developed, well- nourished white female in no obvious distress.  Vital signs:  98.2, pulse 86, respiratory rate 18, blood pressure 109/78.  Weight is 163. Head and neck:  Shows a normocephalic, atraumatic skull.  There are no ocular or oral lesions.  There are no palpable cervical or supraclavicular lymph nodes.  Lungs:  Clear bilaterally.  Cardiac: Regular rate and rhythm with a normal S1, S2.  There are no murmurs, rubs or bruits.  Abdomen:  Soft with good bowel sounds.  There is no palpable abdominal mass.  There is no fluid wave.  No palpable hepatosplenomegaly.  Back:  No tenderness over the spine, ribs or hips. Extremities:  Shows no clubbing, cyanosis or edema.  LABORATORY STUDIES:  White cell  count is 6.3, hemoglobin 12.5, hematocrit 38.1, platelet count 198.  IMPRESSION:  [name] is a 61 year old white female with history of locally recurrent adenocarcinoma of the lung.  She has had I think 3 recurrences.  Of note, her tumor is negative for both EGFR and ALK mutations.  Hopefully, this hiatal hernia can be taken care of.  From my point of view I do not see a problem with her having surgery for this.  She will get her Port-A-Cath flushed today.  She also will get a flu shot.  We will certainly pray for her as her friend is going through hospice and will likely pass away in the next couple weeks.  I want to see Ms. Heick back in another 6 weeks.  We will flush her Port-A-Cath then.  She does not need another scan probably until next year.    ______________________________ Josph Macho, M.D. PRE/MEDQ  D:  12/05/2011  T:  12/06/2011  Job:  [id number]

## 2012-01-16 ENCOUNTER — Ambulatory Visit (HOSPITAL_BASED_OUTPATIENT_CLINIC_OR_DEPARTMENT_OTHER): Payer: Managed Care, Other (non HMO) | Admitting: Hematology & Oncology

## 2012-01-16 ENCOUNTER — Other Ambulatory Visit (HOSPITAL_BASED_OUTPATIENT_CLINIC_OR_DEPARTMENT_OTHER): Payer: Managed Care, Other (non HMO) | Admitting: Lab

## 2012-01-16 ENCOUNTER — Ambulatory Visit: Payer: Managed Care, Other (non HMO)

## 2012-01-16 VITALS — BP 111/68 | HR 76 | Temp 98.1°F | Resp 16 | Ht 61.0 in | Wt 165.0 lb

## 2012-01-16 DIAGNOSIS — G4700 Insomnia, unspecified: Secondary | ICD-10-CM

## 2012-01-16 DIAGNOSIS — M25519 Pain in unspecified shoulder: Secondary | ICD-10-CM

## 2012-01-16 DIAGNOSIS — C3490 Malignant neoplasm of unspecified part of unspecified bronchus or lung: Secondary | ICD-10-CM

## 2012-01-16 DIAGNOSIS — C3410 Malignant neoplasm of upper lobe, unspecified bronchus or lung: Secondary | ICD-10-CM

## 2012-01-16 LAB — CBC WITH DIFFERENTIAL (CANCER CENTER ONLY)
BASO#: 0.1 10*3/uL (ref 0.0–0.2)
Eosinophils Absolute: 0.2 10*3/uL (ref 0.0–0.5)
HGB: 12.1 g/dL (ref 11.6–15.9)
LYMPH%: 23.9 % (ref 14.0–48.0)
MCH: 28 pg (ref 26.0–34.0)
MCV: 84 fL (ref 81–101)
MONO#: 0.4 10*3/uL (ref 0.1–0.9)
Platelets: 199 10*3/uL (ref 145–400)
RBC: 4.32 10*6/uL (ref 3.70–5.32)
WBC: 5.8 10*3/uL (ref 3.9–10.0)

## 2012-01-16 LAB — COMPREHENSIVE METABOLIC PANEL
Albumin: 4.4 g/dL (ref 3.5–5.2)
BUN: 16 mg/dL (ref 6–23)
CO2: 25 mEq/L (ref 19–32)
Glucose, Bld: 85 mg/dL (ref 70–99)
Potassium: 3.6 mEq/L (ref 3.5–5.3)
Sodium: 140 mEq/L (ref 135–145)
Total Bilirubin: 0.3 mg/dL (ref 0.3–1.2)
Total Protein: 6.7 g/dL (ref 6.0–8.3)

## 2012-01-16 MED ORDER — HEPARIN SOD (PORK) LOCK FLUSH 100 UNIT/ML IV SOLN
500.0000 [IU] | Freq: Once | INTRAVENOUS | Status: AC | PRN
  Administered 2012-01-16: 500 [IU] via INTRAVENOUS
  Filled 2012-01-16: qty 5

## 2012-01-16 MED ORDER — ALTEPLASE 2 MG IJ SOLR
2.0000 mg | Freq: Once | INTRAMUSCULAR | Status: DC | PRN
  Filled 2012-01-16: qty 2

## 2012-01-16 MED ORDER — DOXEPIN HCL 6 MG PO TABS: 6.0000 mg | ORAL_TABLET | Freq: Every evening | ORAL | Status: DC | PRN

## 2012-01-16 MED ORDER — SODIUM CHLORIDE 0.9 % IJ SOLN
10.0000 mL | INTRAMUSCULAR | Status: DC | PRN
  Administered 2012-01-16: 10 mL via INTRAVENOUS
  Filled 2012-01-16: qty 10

## 2012-01-16 NOTE — Addendum Note (Signed)
Addended by: Arlan Organ R on: 01/16/2012 12:40 PM   Modules accepted: Orders

## 2012-01-16 NOTE — Progress Notes (Signed)
This office note has been dictated.

## 2012-01-17 NOTE — Progress Notes (Signed)
CC:   Garey Ham, MD Carlton Adam, MD Keturah Barre, MD Tanvir A. Chodri, M.D.  DIAGNOSIS:  Locally recurrent adenocarcinoma of the lung, clinical remission.  CURRENT THERAPY:  Observation.  INTERIM HISTORY:  Ms. Espinal comes in for followup.  We both are very saddened by the loss of our patient and friend, Reche Dixon.  We talked a lot about her.  Ms. Feeser was Ms. Davis's best friend.  Ms. Herbison, herself, keeps having her issues.  She still has abdominal discomfort.  She does have a hiatal hernia that may need to be fixed. She has already had surgery for this.  She is complaining of a lot of pain in the left shoulder.  Also pain in the knees and hips.  We have evaluated this in the past.  We never found any evidence of recurrent cancer.  I told her that she probably needs to go see orthopedic medicine.  We will see about getting her to Dr. Jerl Santos for an evaluation.  Her last CT scan of the chest was done back in September.  Everything looked fine with no evidence of recurrent disease.  Ms. Ashenfelter is mostly limited by pain issues.  She is not one to take pain medications.  She is not sleeping all that well.  I did give her a prescription for Silenor.  She has had no bleeding.  She has had no change in bowel or bladder habits.  There has been no leg swelling.  She has had no rashes.  PHYSICAL EXAMINATION:  General:  This is a fairly well-developed, well- nourished white female in no obvious distress.  Vital signs:  Show a temperature of 98.1, pulse 68, respiratory rate 18, blood pressure 111/68.  Weight is 165.  Head and neck:  Shows a normocephalic, atraumatic skull.  There are no ocular or oral lesions.  There are no palpable cervical or supraclavicular lymph nodes.  Lungs:  Clear bilaterally.  Cardiac:  Regular rate and rhythm with normal S1, S2. There are no murmurs, rubs or bruits.  Abdomen:  Soft with good bowel sounds.  There is no palpable abdominal mass.   There may be some slight tenderness in the epigastric and right upper quadrant.  Her liver edge is not palpable.  Back:  Exam shows some tenderness over the ribs bilaterally.  Extremities:  Show no clubbing, cyanosis or edema.  LABORATORY STUDIES:  White cell count 5.8, hemoglobin 12.1, hematocrit 36.2, platelet count 199.  Sodium ___, potassium 3.6, BUN 16, creatinine 0.57.  Calcium 9.3 with an albumin of 4.4.  LFTs are normal.  IMPRESSION:  Ms. Warga is a 61 year old white female with history of locally recurrent adenocarcinoma of the lung.  She did receive radiation therapy for this.  She has seen the radiation oncologist at [facility].  He says she always will have some discomfort in the chest because of radiation and scar tissue.  We will go ahead and rescan her, however.  Given her symptoms and the fact that she has really had a couple recurrences, we need to make sure that she does not have another recurrence.  I think the only way we can do this is with a CT scan.  We will go ahead and plan to get her back to see [place] in another couple months or so.  If we have any issues with the scans we will see her sooner.    ______________________________ Josph Macho, M.D. PRE/MEDQ  D:  01/16/2012  T:  01/17/2012  Job:  [id number]

## 2012-01-20 ENCOUNTER — Ambulatory Visit
Admission: RE | Admit: 2012-01-20 | Discharge: 2012-01-20 | Disposition: A | Discharge status: Home / Self Care | Payer: Managed Care, Other (non HMO) | Source: Ambulatory Visit | Attending: Hematology & Oncology | Admitting: Hematology & Oncology

## 2012-01-20 DIAGNOSIS — C3490 Malignant neoplasm of unspecified part of unspecified bronchus or lung: Secondary | ICD-10-CM

## 2012-01-20 MED ORDER — IOHEXOL 300 MG/ML  SOLN
100.0000 mL | Freq: Once | INTRAMUSCULAR | Status: AC | PRN
  Administered 2012-01-20: 100 mL via INTRAVENOUS

## 2012-01-25 ENCOUNTER — Other Ambulatory Visit: Payer: Self-pay | Admitting: *Deleted

## 2012-01-25 DIAGNOSIS — C3490 Malignant neoplasm of unspecified part of unspecified bronchus or lung: Secondary | ICD-10-CM

## 2012-01-25 MED ORDER — GABAPENTIN 300 MG PO CAPS: 300.0000 mg | ORAL_CAPSULE | Freq: Every day | ORAL | Status: DC | PRN

## 2012-01-25 NOTE — Telephone Encounter (Signed)
Pt called yesterday requesting the results of her scans and to ask if Dr Myna Hidalgo would prescribe her Gabapentin. She takes the Gabapentin 300 mg qd prn for nerve related pain from her surgical site.

## 2012-01-30 IMAGING — PT NM PET TUM IMG RESTAG (PS) SKULL BASE T - THIGH
6 series · 25 of 25 positions shown · non-contrast
Comparison: 11/05/2009

CLINICAL DATA: Subsequent treatment strategy for lung carcinoma.

NUCLEAR MEDICINE PET CT RESTAGING (PS) SKULL BASE TO THIGH
TECHNIQUE: 17.2 mCi F-18 FDG was injected intravenously via the
left arm.  Full-ring PET imaging was performed from the skull base
through the mid-thighs 88  minutes after injection.  CT data was
obtained and used for attenuation correction and anatomic
localization only.  (This was not acquired as a diagnostic CT
examination.)
Fasting Blood Glucose:  92

[Series 1: pet ac · axial · 3.3mm · 4.69mm/px · z∈[-759,-33]mm · 5 of 223 slices shown]
[im 1/223]
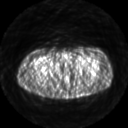
[im 56/223]
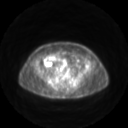
[im 112/223]
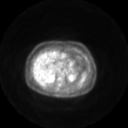
[im 167/223]
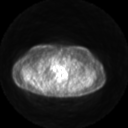
[im 223/223]
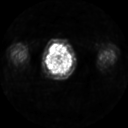

[Series 2: pet nac · axial · 3.3mm · 4.69mm/px · z∈[-759,-33]mm · 6 of 223 slices shown]
[im 1/223]
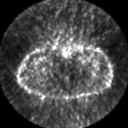
[im 45/223]
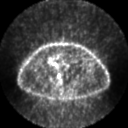
[im 89/223]
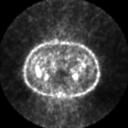
[im 134/223]
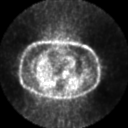
[im 178/223]
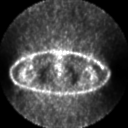
[im 223/223]
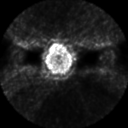

[Series 2: ct images · axial · 3.8mm · 0.98mm/px · z∈[-759,-33]mm · 5 of 221 slices shown]
[im 1/221]
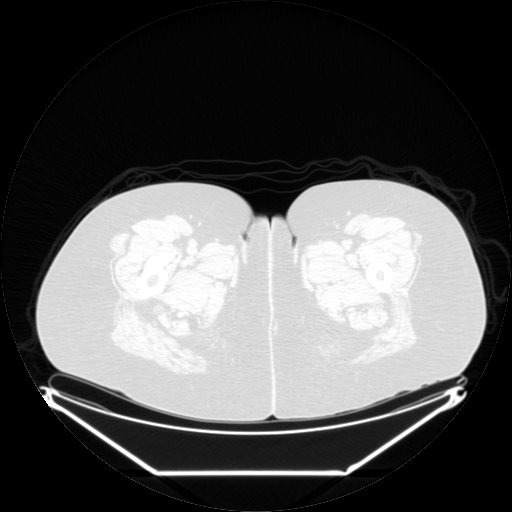
[im 56/221]
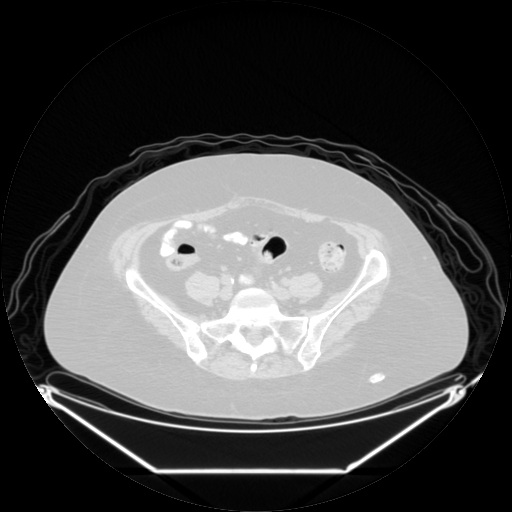
[im 111/221]
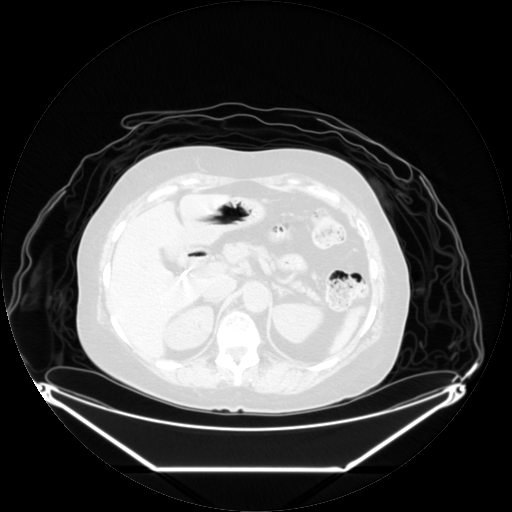
[im 166/221]
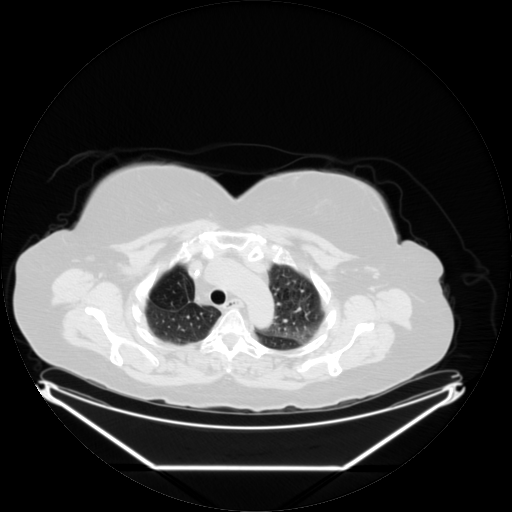
[im 221/221  brain]
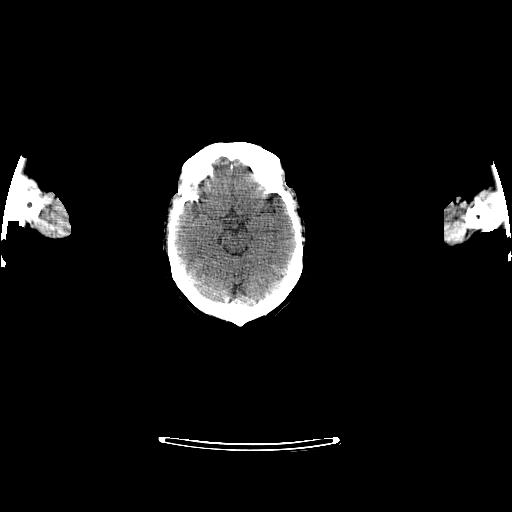

[Series 123: mip · coronal · 3.3mm · 4.69mm/px · 1 of 30 slices shown]
[im 1/30]
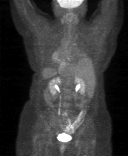

[Series 151: reformatted · axial · 3.3mm · 3.91mm/px · z∈[-759,-33]mm · 6 of 223 slices shown (1 of 2)]
[im 1/223]
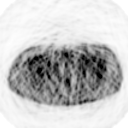
[im 45/223]
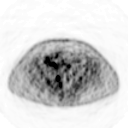
[im 89/223]
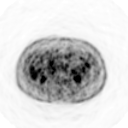
[im 134/223]
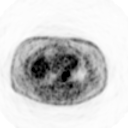
[im 178/223]
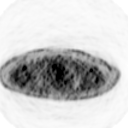
[im 223/223]
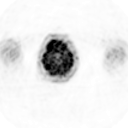

[Series 153: reformatted · coronal · 4.7mm · 5.83mm/px · 2 of 75 slices shown (2 of 2)]
[im 1/75]
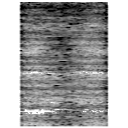
[im 75/75]
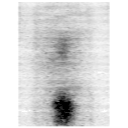

[25 of 25 positions shown; findings below may reference images not displayed]

FINDINGS: Previously seen hypermetabolic activity within mild
subcarinal lymphadenopathy is completely resolved since prior
study.  There has been near complete resolution of hypermetabolic
adenopathy in the right paratracheal region, which has
significantly decreased in both size and uptake intensity.  The
maximum SUV at this site now measures 3.6 compared to 13.3 on prior
exam.  No new sites of hypermetabolic adenopathy are seen within
the thorax.

No hypermetabolic masses or lymphadenopathy identified within the
neck, abdomen, or pelvis.  Small to moderate hiatal hernia again
noted.
IMPRESSION: Significant interval response to therapy, with mild residual
hypermetabolic adenopathy present only in the right paratracheal
region.  No sites of new or progressive disease.

## 2012-02-24 ENCOUNTER — Other Ambulatory Visit: Payer: Self-pay | Admitting: Medical

## 2012-02-24 DIAGNOSIS — C3490 Malignant neoplasm of unspecified part of unspecified bronchus or lung: Secondary | ICD-10-CM

## 2012-02-27 ENCOUNTER — Other Ambulatory Visit (HOSPITAL_BASED_OUTPATIENT_CLINIC_OR_DEPARTMENT_OTHER): Payer: 59 | Admitting: Lab

## 2012-02-27 ENCOUNTER — Ambulatory Visit (HOSPITAL_BASED_OUTPATIENT_CLINIC_OR_DEPARTMENT_OTHER): Payer: Managed Care, Other (non HMO)

## 2012-02-27 ENCOUNTER — Ambulatory Visit (HOSPITAL_BASED_OUTPATIENT_CLINIC_OR_DEPARTMENT_OTHER): Payer: Managed Care, Other (non HMO) | Admitting: Hematology & Oncology

## 2012-02-27 VITALS — BP 112/80 | HR 80 | Temp 97.7°F | Resp 16 | Wt 165.0 lb

## 2012-02-27 DIAGNOSIS — C3490 Malignant neoplasm of unspecified part of unspecified bronchus or lung: Secondary | ICD-10-CM

## 2012-02-27 DIAGNOSIS — R0789 Other chest pain: Secondary | ICD-10-CM

## 2012-02-27 DIAGNOSIS — Z23 Encounter for immunization: Secondary | ICD-10-CM

## 2012-02-27 DIAGNOSIS — Z85118 Personal history of other malignant neoplasm of bronchus and lung: Secondary | ICD-10-CM

## 2012-02-27 LAB — CBC WITH DIFFERENTIAL (CANCER CENTER ONLY)
BASO%: 1.2 % (ref 0.0–2.0)
Eosinophils Absolute: 0.2 10*3/uL (ref 0.0–0.5)
MONO#: 0.5 10*3/uL (ref 0.1–0.9)
MONO%: 8.6 % (ref 0.0–13.0)
NEUT#: 3.1 10*3/uL (ref 1.5–6.5)
Platelets: 189 10*3/uL (ref 145–400)
RBC: 4.3 10*6/uL (ref 3.70–5.32)
WBC: 5.2 10*3/uL (ref 3.9–10.0)

## 2012-02-27 LAB — COMPREHENSIVE METABOLIC PANEL
ALT: 14 U/L (ref 0–35)
Albumin: 4.5 g/dL (ref 3.5–5.2)
Alkaline Phosphatase: 63 U/L (ref 39–117)
CO2: 26 mEq/L (ref 19–32)
Glucose, Bld: 81 mg/dL (ref 70–99)
Potassium: 3.6 mEq/L (ref 3.5–5.3)
Sodium: 139 mEq/L (ref 135–145)
Total Protein: 7.2 g/dL (ref 6.0–8.3)

## 2012-02-27 MED ORDER — SODIUM CHLORIDE 0.9 % IJ SOLN
10.0000 mL | INTRAMUSCULAR | Status: DC | PRN
  Administered 2012-02-27: 10 mL via INTRAVENOUS
  Filled 2012-02-27: qty 10

## 2012-02-27 MED ORDER — PNEUMOCOCCAL VAC POLYVALENT 25 MCG/0.5ML IJ INJ
0.5000 mL | INJECTION | Freq: Once | INTRAMUSCULAR | Status: AC
  Administered 2012-02-27: 0.5 mL via INTRAMUSCULAR
  Filled 2012-02-27: qty 0.5

## 2012-02-27 MED ORDER — HEPARIN SOD (PORK) LOCK FLUSH 100 UNIT/ML IV SOLN
500.0000 [IU] | Freq: Once | INTRAVENOUS | Status: AC | PRN
  Administered 2012-02-27: 500 [IU] via INTRAVENOUS
  Filled 2012-02-27: qty 5

## 2012-02-27 MED ORDER — ALTEPLASE 2 MG IJ SOLR
2.0000 mg | Freq: Once | INTRAMUSCULAR | Status: DC | PRN
  Filled 2012-02-27: qty 2

## 2012-02-27 NOTE — Progress Notes (Signed)
This office note has been dictated.

## 2012-02-27 NOTE — Patient Instructions (Signed)

## 2012-02-28 NOTE — Progress Notes (Signed)
CC:   Carlton Adam, MD Tanvir A. Chodri, M.D. Keturah Barre, MD Garey Ham, MD  DIAGNOSES: 1. Locally recurrent adenocarcinoma of the lung, clinical recurrence. 2. 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  CURRENT THERAPY:  Observation.  INTERIM HISTORY:  Ms. Woodford comes in for her followup.  She continues to be bothered by pain over the right side.  We continue to do scans on her.  We did some scans on her that were done on 01/20/2012.  The CT scans did not show any evidence of recurrent disease.  She does have a small to moderate hiatal hernia, which she has always had.  Again, there is no evidence of recurrent tumor in the chest or abdomen/pelvis.  I think we probably need to refer her to Pain Clinic to see about maybe a nerve block for this right thoracotomy issue.  She is able to function, but is really bothered by the pain.  She does not like taking pain medication.  I think that she would have problems with additional medication.  She is on Neurontin.  This really has not had much impact.  She only takes it daily.  She has had no cough.  She has had no bleeding.  There has been no change in bowel or bladder habits.  There has been no leg or arm swelling.  There have been no rashes.  She has had no headache.  PHYSICAL EXAMINATION:  General:  This is a well-developed, well- nourished white female in no obvious distress.  Vital signs: Temperature of 97.7, pulse 80, respiratory rate 16, blood pressure 112/80.  Weight is 165.  Head and neck:  Normocephalic, atraumatic skull.  There are no ocular or oral lesions.  There are no palpable cervical or supraclavicular lymph nodes.  Lungs:  Clear bilaterally. Cardiac:  Regular rate and rhythm, with a normal S1 and S2.  There are no murmurs, rubs or bruits.  Abdomen:  Soft with good bowel sounds. There is no palpable abdominal mass.  There is some slight tenderness in the right upper quadrant.  There is some slight  fullness in the right upper quadrant.  No distinct mass is noted.  There is no palpable hepatosplenomegaly.  Extremities:  Show no clubbing, cyanosis, or edema. Chest wall:  Shows some tenderness about the thoracotomy site in the right lateral chest wall.  No erythema or swelling is noted at the thoracotomy site.  Neurological:  No focal neurological deficits.  LABORATORY STUDIES:  White cell count is 5.2, hemoglobin 12, hematocrit 36, platelet count 189,000.  Metabolic panel is normal.  IMPRESSION:  Ms. Gathright is a 62 year old white female with a history of a locally recurrent adenocarcinoma of the lung.  We treated the local recurrence with radiation and chemotherapy.  She got Alimta as a radiation sensitizer.  She has been free of any recurrence now probably for about a year.  Again, we will have to see if there is anything the pain clinic can do for this thoracotomy issue.  Again, I have to believe that this is all related to scar tissue and potential nerve impingement.  Again, I do not see any evidence of cancer recurrence.  However, she clearly is at risk for recurrence in the future.  I do not think we need any scans on her probably for another 3 or 4 months.  She will get her Port-A-Cath flushed today.  I will plan to see her back in 2 months' time.  Hopefully by then, she would  have had the appointment at the Pain Clinic.    ______________________________ Josph Macho, M.D. PRE/MEDQ  D:  02/27/2012  T:  02/27/2012  Job:  [id number]

## 2012-03-07 NOTE — Telephone Encounter (Signed)
Opened in error

## 2012-04-12 ENCOUNTER — Telehealth: Payer: Self-pay | Admitting: Hematology & Oncology

## 2012-04-12 NOTE — Telephone Encounter (Signed)
Due to no provider in the office on 04/23/12, patient's apt was cx and resch for 05/22/12.  i called to give patient resch apt date/time.  There was no answer, so i left message on voice mail

## 2012-04-12 NOTE — Telephone Encounter (Signed)
Patient called and cx 05/22/12 apt and resch the flush apt for 04/30/12.  She resch lab/md for 05/30/12

## 2012-04-23 ENCOUNTER — Ambulatory Visit: Payer: Managed Care, Other (non HMO) | Admitting: Hematology & Oncology

## 2012-04-23 ENCOUNTER — Other Ambulatory Visit: Payer: Managed Care, Other (non HMO) | Admitting: Lab

## 2012-04-30 ENCOUNTER — Ambulatory Visit (HOSPITAL_BASED_OUTPATIENT_CLINIC_OR_DEPARTMENT_OTHER): Payer: 59

## 2012-04-30 DIAGNOSIS — Z85118 Personal history of other malignant neoplasm of bronchus and lung: Secondary | ICD-10-CM

## 2012-04-30 DIAGNOSIS — Z452 Encounter for adjustment and management of vascular access device: Secondary | ICD-10-CM

## 2012-04-30 MED ORDER — HEPARIN SOD (PORK) LOCK FLUSH 100 UNIT/ML IV SOLN
500.0000 [IU] | Freq: Once | INTRAVENOUS | Status: AC | PRN
  Administered 2012-04-30: 500 [IU] via INTRAVENOUS
  Filled 2012-04-30: qty 5

## 2012-04-30 MED ORDER — SODIUM CHLORIDE 0.9 % IJ SOLN
10.0000 mL | INTRAMUSCULAR | Status: DC | PRN
  Administered 2012-04-30: 10 mL via INTRAVENOUS
  Filled 2012-04-30: qty 10

## 2012-04-30 NOTE — Patient Instructions (Addendum)

## 2012-05-22 ENCOUNTER — Ambulatory Visit: Payer: Managed Care, Other (non HMO) | Admitting: Hematology & Oncology

## 2012-05-22 ENCOUNTER — Other Ambulatory Visit: Payer: Managed Care, Other (non HMO) | Admitting: Lab

## 2012-05-26 IMAGING — CT CT CHEST W/ CM
3 of 4 series · 16 of 30 positions shown, 18 images · IV contrast (75CC OMNI 300)
Comparison: PET CT 03/17/2010 and CT chest 10/14/2009

CLINICAL DATA: Lung cancer.

CT CHEST WITH CONTRAST
TECHNIQUE: Multidetector CT imaging of the chest was performed
following the standard protocol during bolus administration of
intravenous contrast.
Contrast: 75 ml 5mnipaque-DNN

[Series 3: routine chest · axial · 0.70mm/px · z∈[-240,-36]mm · 5 of 63 slices shown, 7 images]
[im 11/63  mediastinal]
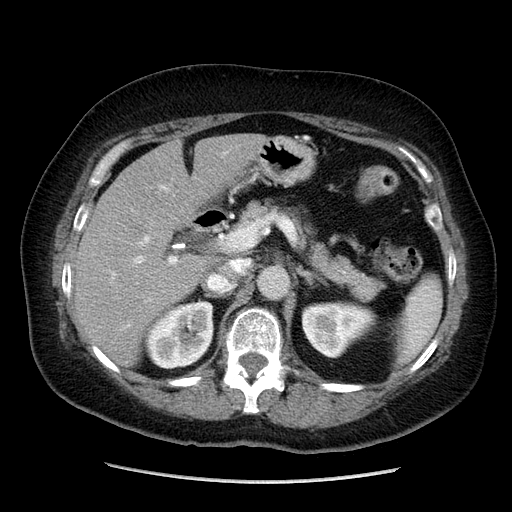
[im 11/63  lung]
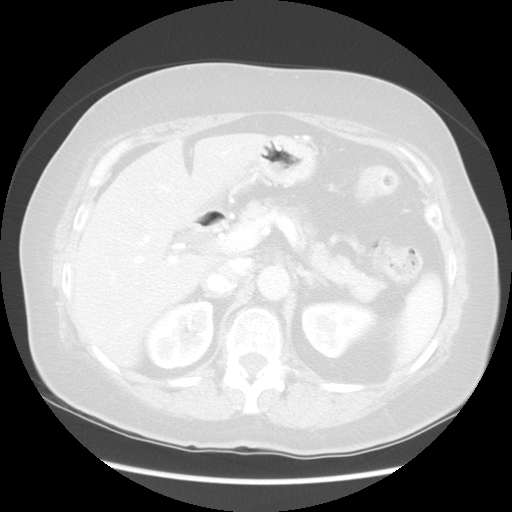
[im 21/63  lung]
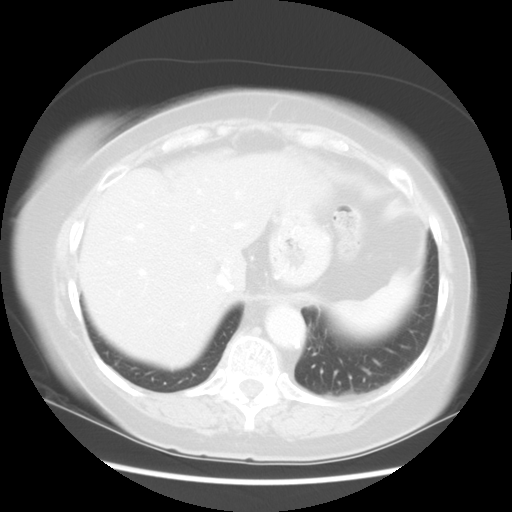
[im 32/63  lung]
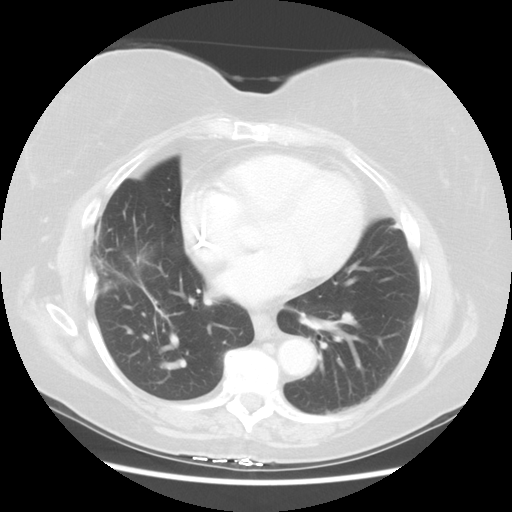
[im 42/63  lung]
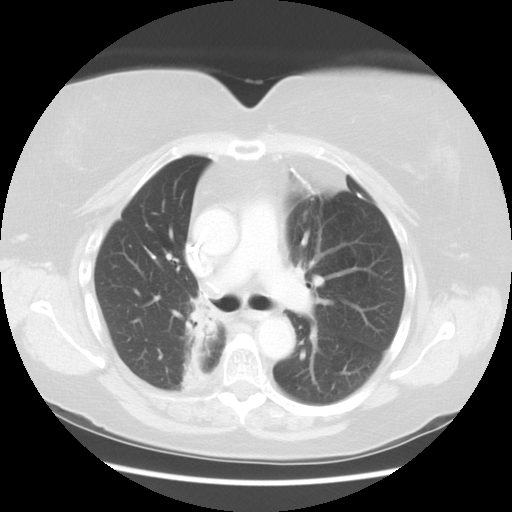
[im 52/63  mediastinal]
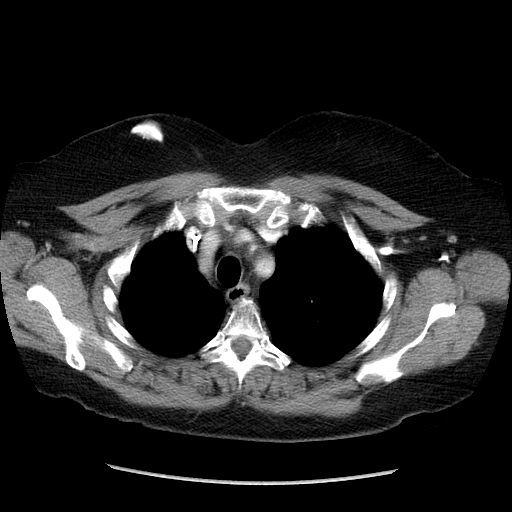
[im 52/63  lung]
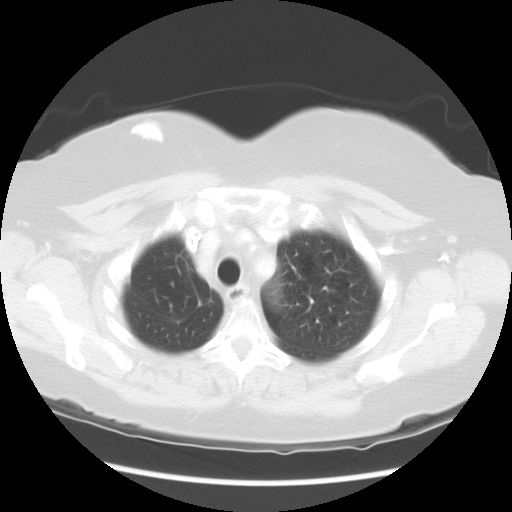

[Series 4: lung windows · axial · 0.70mm/px · z∈[-166,-46]mm · 3 of 50 slices shown]
[im 13/50  lung]
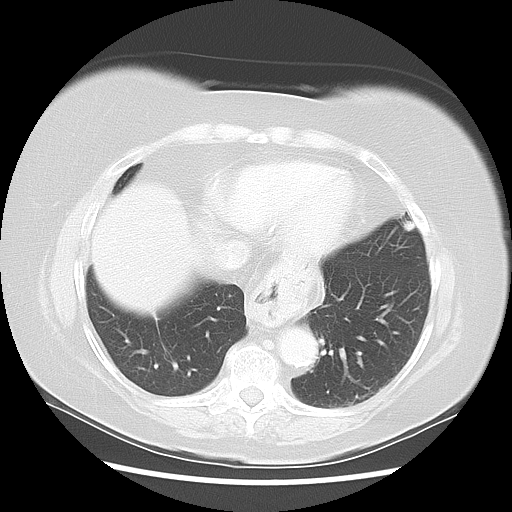
[im 25/50  lung]
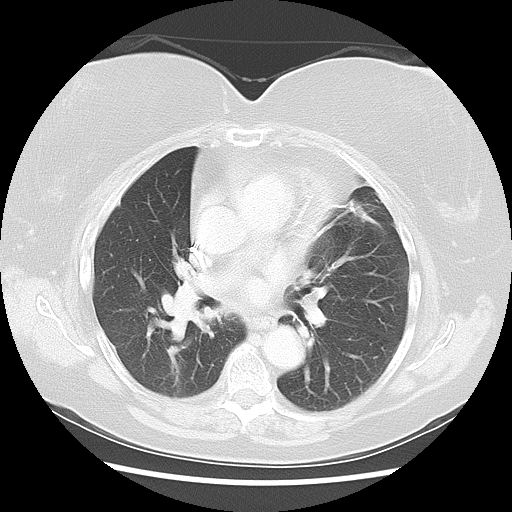
[im 37/50  lung]
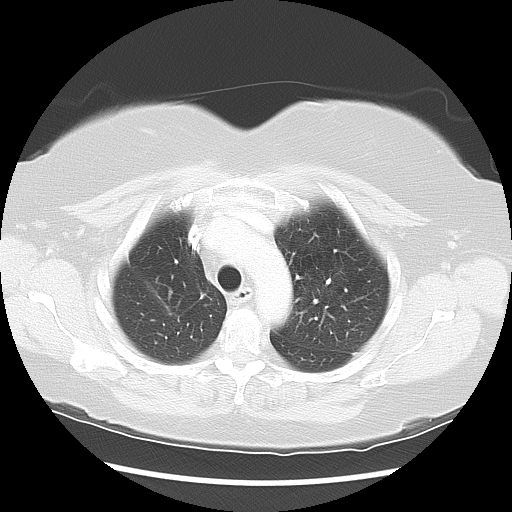

[Series 602: sagittal body · sagittal · 0.70mm/px · 8 of 145 slices shown]
[im 10/145  mediastinal]
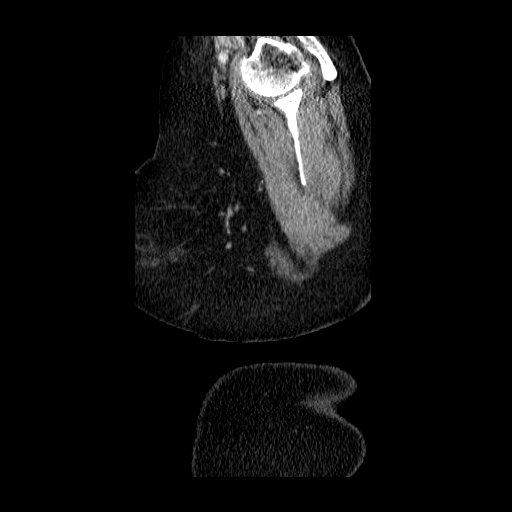
[im 29/145  mediastinal]
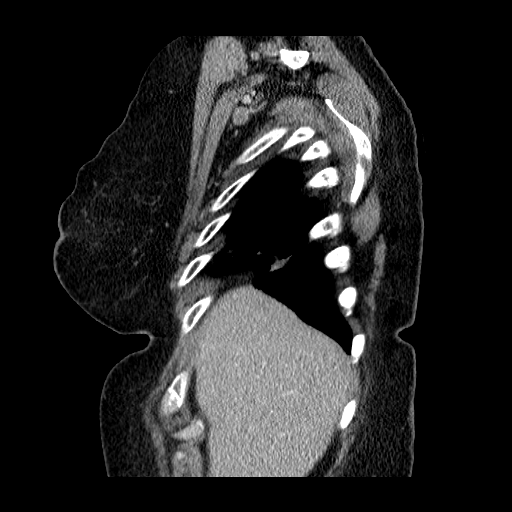
[im 49/145  mediastinal]
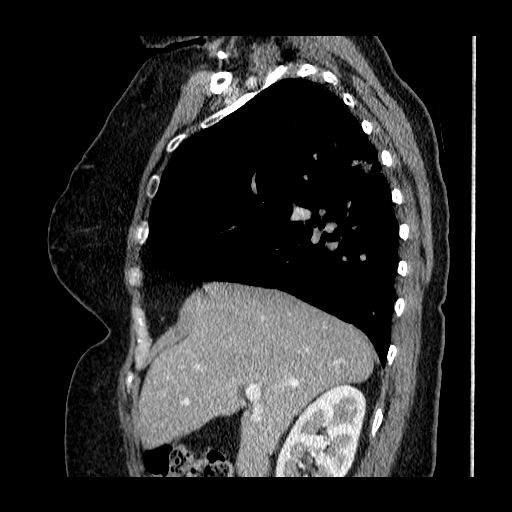
[im 68/145  mediastinal]
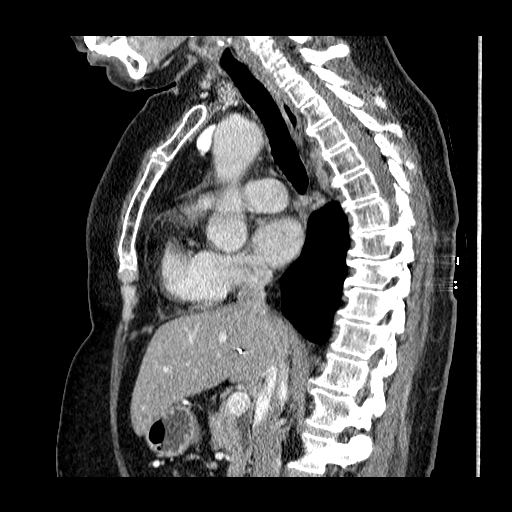
[im 77/145  mediastinal]
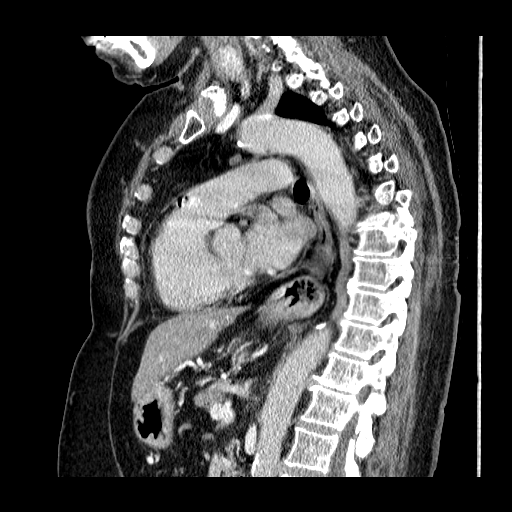
[im 97/145  mediastinal]
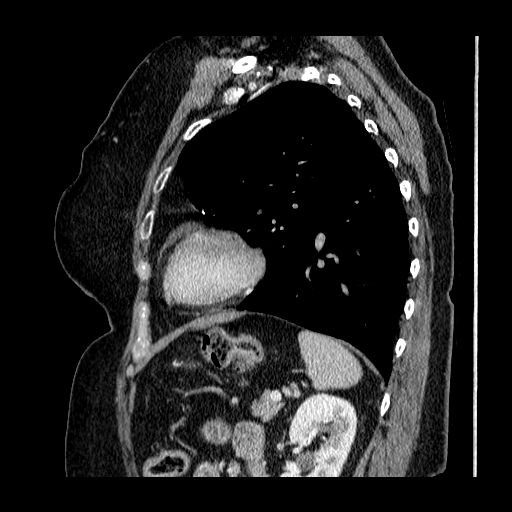
[im 116/145  mediastinal]
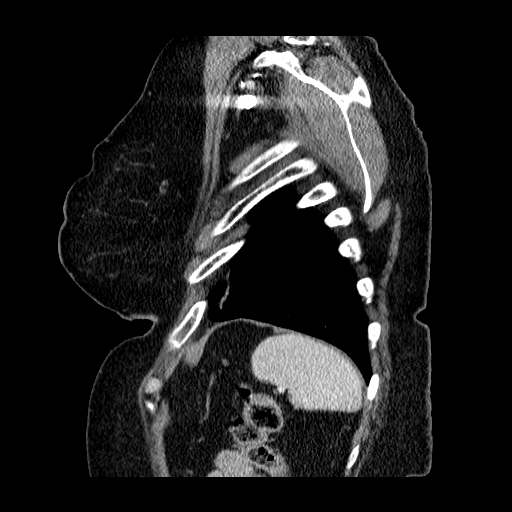
[im 135/145  mediastinal]
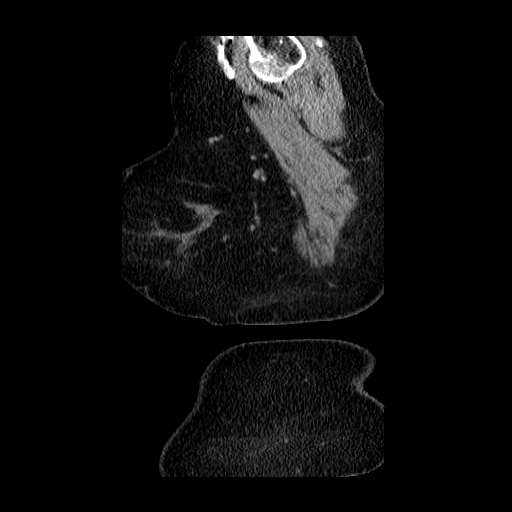

[16 of 30 positions shown; findings below may reference images not displayed]

FINDINGS: A 7 mm AP window lymph node is likely stable from
10/14/2009.  No hilar or axillary adenopathy.  Heart size normal.
No pericardial effusion.  Small hiatal hernia.

Centrilobular emphysema.  Postoperative changes of right upper
lobectomy.  Ill-defined air space disease is again seen in the
right lower lobe (image 28).  New linear volume loss and mild
bronchiectasis are seen in the medial aspect of the the superior
segment right lower lobe (image 21).  There are postoperative
changes in the left upper lobe.  No pleural fluid.  Airway is
otherwise unremarkable.

Incidental imaging of the upper abdomen shows mildly decreased
attenuation throughout the visualized portion of the liver.
Cholecystectomy.  Surgical clips are seen in the gastrohepatic
ligament.  Adrenal glands are unremarkable.  No worrisome lytic or
sclerotic lesions.
IMPRESSION: 1.  Peribronchovascular airspace consolidation in the right lower
lobe is unchanged.
2.  New volume loss and bronchiectasis in the superior segment
right lower lobe has the appearance of interval radiation therapy,
in the appropriate clinical setting.
3.  Question hepatic steatosis.

## 2012-05-30 ENCOUNTER — Ambulatory Visit: Payer: Managed Care, Other (non HMO)

## 2012-05-30 ENCOUNTER — Other Ambulatory Visit (HOSPITAL_BASED_OUTPATIENT_CLINIC_OR_DEPARTMENT_OTHER): Payer: Managed Care, Other (non HMO) | Admitting: Lab

## 2012-05-30 ENCOUNTER — Ambulatory Visit (HOSPITAL_BASED_OUTPATIENT_CLINIC_OR_DEPARTMENT_OTHER): Payer: Managed Care, Other (non HMO) | Admitting: Hematology & Oncology

## 2012-05-30 DIAGNOSIS — Z85118 Personal history of other malignant neoplasm of bronchus and lung: Secondary | ICD-10-CM

## 2012-05-30 DIAGNOSIS — C3490 Malignant neoplasm of unspecified part of unspecified bronchus or lung: Secondary | ICD-10-CM

## 2012-05-30 DIAGNOSIS — R1011 Right upper quadrant pain: Secondary | ICD-10-CM

## 2012-05-30 DIAGNOSIS — K449 Diaphragmatic hernia without obstruction or gangrene: Secondary | ICD-10-CM

## 2012-05-30 LAB — CBC WITH DIFFERENTIAL (CANCER CENTER ONLY)
BASO#: 0.1 10*3/uL (ref 0.0–0.2)
Eosinophils Absolute: 0.1 10*3/uL (ref 0.0–0.5)
HCT: 36.4 % (ref 34.8–46.6)
HGB: 11.9 g/dL (ref 11.6–15.9)
LYMPH%: 20.1 % (ref 14.0–48.0)
MCV: 85 fL (ref 81–101)
MONO#: 0.7 10*3/uL (ref 0.1–0.9)
NEUT%: 69.6 % (ref 39.6–80.0)
Platelets: 175 10*3/uL (ref 145–400)
RBC: 4.26 10*6/uL (ref 3.70–5.32)
WBC: 8.2 10*3/uL (ref 3.9–10.0)

## 2012-05-30 LAB — COMPREHENSIVE METABOLIC PANEL
Albumin: 3.8 g/dL (ref 3.5–5.2)
CO2: 26 mEq/L (ref 19–32)
Glucose, Bld: 85 mg/dL (ref 70–99)
Sodium: 139 mEq/L (ref 135–145)
Total Bilirubin: 0.3 mg/dL (ref 0.3–1.2)
Total Protein: 6.2 g/dL (ref 6.0–8.3)

## 2012-05-30 MED ORDER — ALTEPLASE 2 MG IJ SOLR
2.0000 mg | Freq: Once | INTRAMUSCULAR | Status: DC | PRN
  Filled 2012-05-30: qty 2

## 2012-05-30 MED ORDER — SODIUM CHLORIDE 0.9 % IJ SOLN
10.0000 mL | INTRAMUSCULAR | Status: DC | PRN
  Administered 2012-05-30: 10 mL via INTRAVENOUS
  Filled 2012-05-30: qty 10

## 2012-05-30 MED ORDER — HEPARIN SOD (PORK) LOCK FLUSH 100 UNIT/ML IV SOLN
500.0000 [IU] | Freq: Once | INTRAVENOUS | Status: AC | PRN
  Administered 2012-05-30: 500 [IU] via INTRAVENOUS
  Filled 2012-05-30: qty 5

## 2012-05-30 NOTE — Progress Notes (Signed)
This office note has been dictated.

## 2012-05-30 NOTE — Progress Notes (Signed)
[name] presented for Portacath access and flush. Proper placement of portacath confirmed by CXR. Portacath located in the right chest wall accessed with  H 20 needle. Clean, Dry and Intact Good blood return present. Portacath flushed with 20ml NS and 500U/78ml Heparin per protocol and needle removed intact. Procedure without incident. Patient tolerated procedure well.

## 2012-05-30 NOTE — Patient Instructions (Signed)

## 2012-05-31 ENCOUNTER — Telehealth: Payer: Self-pay | Admitting: *Deleted

## 2012-05-31 NOTE — Telephone Encounter (Signed)
Message copied by Anselm Jungling on Thu May 31, 2012 11:25 AM ------      Message from: Arlan Organ R      Created: Wed May 30, 2012  6:10 PM       Call - labs are ok!!  Cindee Lame ------

## 2012-05-31 NOTE — Progress Notes (Signed)
CC:   Garey Ham, MD Keturah Barre, MD Tanvir A. Chodri, M.D. Carlton Adam, MD  DIAGNOSES: 1. History of locally recurrent adenocarcinoma of the lung. 2. 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  CURRENT THERAPY:  Observation.  INTERIM HISTORY:  Ms. Anthis comes in for followup.  She reports she was hospitalized recently.  I think this was down in [place].  She appeared to have, I guess, pneumonia.  She had a CT scan done.  She did not have a pulmonary embolism.  They did not see any evidence of recurrent cancer.  She is feeling better.  She still feels a little bit on the weak side.  She is really complaining mostly of pain in the right upper quadrant. She has a hiatal hernia.  She wonders if the hiatal hernia is getting worse.  We will go ahead and get an upper GI on her so we can better assess the hiatal hernia.  I really think that she may need to go back to a surgeon to have this evaluated.  She got through the winter okay.  She lost her power for about 4 days. Otherwise, there was no other issue.  Thankfully, there was no damage to her house by falling trees.  She has had no leg or arm swelling.  There have been no rashes.  She has had no problems with bowels or bladder.  PHYSICAL EXAMINATION:  General:  This is a well-developed, well- nourished white female in no obvious distress.  Vital signs: Temperature of 98.2, pulse 80, respiratory rate 16, blood pressure 116/76.  Weight is 164.  Head and neck:  Normocephalic, atraumatic skull.  There are no ocular or oral lesions.  There are no palpable cervical or supraclavicular lymph nodes.  Lungs:  Clear bilaterally. Cardiac:  Regular rate and rhythm with a normal S1 and S2.  There are no murmurs, rubs, or bruits.  Abdomen:  Soft.  She has good bowel sounds. There is some slight tenderness in the epigastric region.  No obvious mass is noted.  There is no palpable hepatomegaly.  There is no splenomegaly.   Back:  No tenderness over the spine, ribs, or hips. Extremities:  No clubbing, cyanosis, or edema.  LABORATORY STUDIES:  White cell count is 8.2, hemoglobin 11.9, hematocrit 36.4, platelet count 175.  IMPRESSION:  Ms. Warwick is a very nice 62 year old white female with history of locally recurrent adenocarcinoma of the lung.  She received Alimta with radiation.  She got radiation out at [facility].  She completed this back in April 2012.  I am glad that she did have the CT scan done down in [place].  Her last one was done back in December.  She does not need another one until July or August of this year.  We will see what the upper GI shows.  I am just glad that there is no evidence of recurrent lung cancer at this point in time.  She certainly is at risk for recurrence because of her past history.  I will see her back myself in 3 months.  We will get her Port-A-Cath flushed today and every 6 weeks.    ______________________________ Josph Macho, M.D. PRE/MEDQ  D:  05/30/2012  T:  05/31/2012  Job:  [id number]

## 2012-05-31 NOTE — Telephone Encounter (Signed)
Called patient to let her know that her labwork was ok per dr. ennever 

## 2012-06-04 ENCOUNTER — Ambulatory Visit (HOSPITAL_COMMUNITY)
Admission: RE | Admit: 2012-06-04 | Discharge: 2012-06-04 | Disposition: A | Discharge status: Home / Self Care | Payer: Managed Care, Other (non HMO) | Source: Ambulatory Visit | Attending: Hematology & Oncology | Admitting: Hematology & Oncology

## 2012-06-04 DIAGNOSIS — K449 Diaphragmatic hernia without obstruction or gangrene: Secondary | ICD-10-CM | POA: Insufficient documentation

## 2012-06-04 DIAGNOSIS — K5710 Diverticulosis of small intestine without perforation or abscess without bleeding: Secondary | ICD-10-CM | POA: Insufficient documentation

## 2012-06-04 DIAGNOSIS — R1011 Right upper quadrant pain: Secondary | ICD-10-CM | POA: Insufficient documentation

## 2012-06-04 DIAGNOSIS — Z85118 Personal history of other malignant neoplasm of bronchus and lung: Secondary | ICD-10-CM | POA: Insufficient documentation

## 2012-06-04 DIAGNOSIS — M4120 Other idiopathic scoliosis, site unspecified: Secondary | ICD-10-CM | POA: Insufficient documentation

## 2012-06-04 DIAGNOSIS — K219 Gastro-esophageal reflux disease without esophagitis: Secondary | ICD-10-CM | POA: Insufficient documentation

## 2012-06-04 DIAGNOSIS — R1013 Epigastric pain: Secondary | ICD-10-CM | POA: Insufficient documentation

## 2012-06-04 DIAGNOSIS — R12 Heartburn: Secondary | ICD-10-CM | POA: Insufficient documentation

## 2012-06-05 ENCOUNTER — Telehealth: Payer: Self-pay | Admitting: *Deleted

## 2012-06-05 NOTE — Telephone Encounter (Addendum)
Message copied by Mirian Capuchin on Tue Jun 05, 2012  4:55 PM ------      Message from: Arlan Organ R      Created: Tue Jun 05, 2012  1:30 PM       Call - you do have a good sized hiatal hernia.  This may need to be looked at by your surgeon.  Pete ------This message left on pt's mobile phone.

## 2012-07-18 ENCOUNTER — Ambulatory Visit (HOSPITAL_BASED_OUTPATIENT_CLINIC_OR_DEPARTMENT_OTHER): Payer: 59

## 2012-07-18 DIAGNOSIS — Z85118 Personal history of other malignant neoplasm of bronchus and lung: Secondary | ICD-10-CM

## 2012-07-18 DIAGNOSIS — Z452 Encounter for adjustment and management of vascular access device: Secondary | ICD-10-CM

## 2012-07-18 MED ORDER — HEPARIN SOD (PORK) LOCK FLUSH 100 UNIT/ML IV SOLN
500.0000 [IU] | Freq: Once | INTRAVENOUS | Status: AC | PRN
  Administered 2012-07-18: 500 [IU] via INTRAVENOUS
  Filled 2012-07-18: qty 5

## 2012-07-18 MED ORDER — SODIUM CHLORIDE 0.9 % IJ SOLN
10.0000 mL | INTRAMUSCULAR | Status: DC | PRN
  Administered 2012-07-18: 10 mL via INTRAVENOUS
  Filled 2012-07-18: qty 10

## 2012-07-18 MED ORDER — ALTEPLASE 2 MG IJ SOLR
2.0000 mg | Freq: Once | INTRAMUSCULAR | Status: DC | PRN
  Filled 2012-07-18: qty 2

## 2012-07-18 NOTE — Patient Instructions (Signed)

## 2012-07-30 IMAGING — CR DG SHOULDER 2+V*L*
3 series · 3 of 3 positions shown · non-contrast
Comparison: MRI 04/01/2004.

CLINICAL DATA: Pain in the left scapular region.  Pain for 1 week.
No history of injury.  History of lung carcinoma.  History of
radiation and chemotherapy.

LEFT SHOULDER - 2+ VIEW

[w shoulder ap internal left]
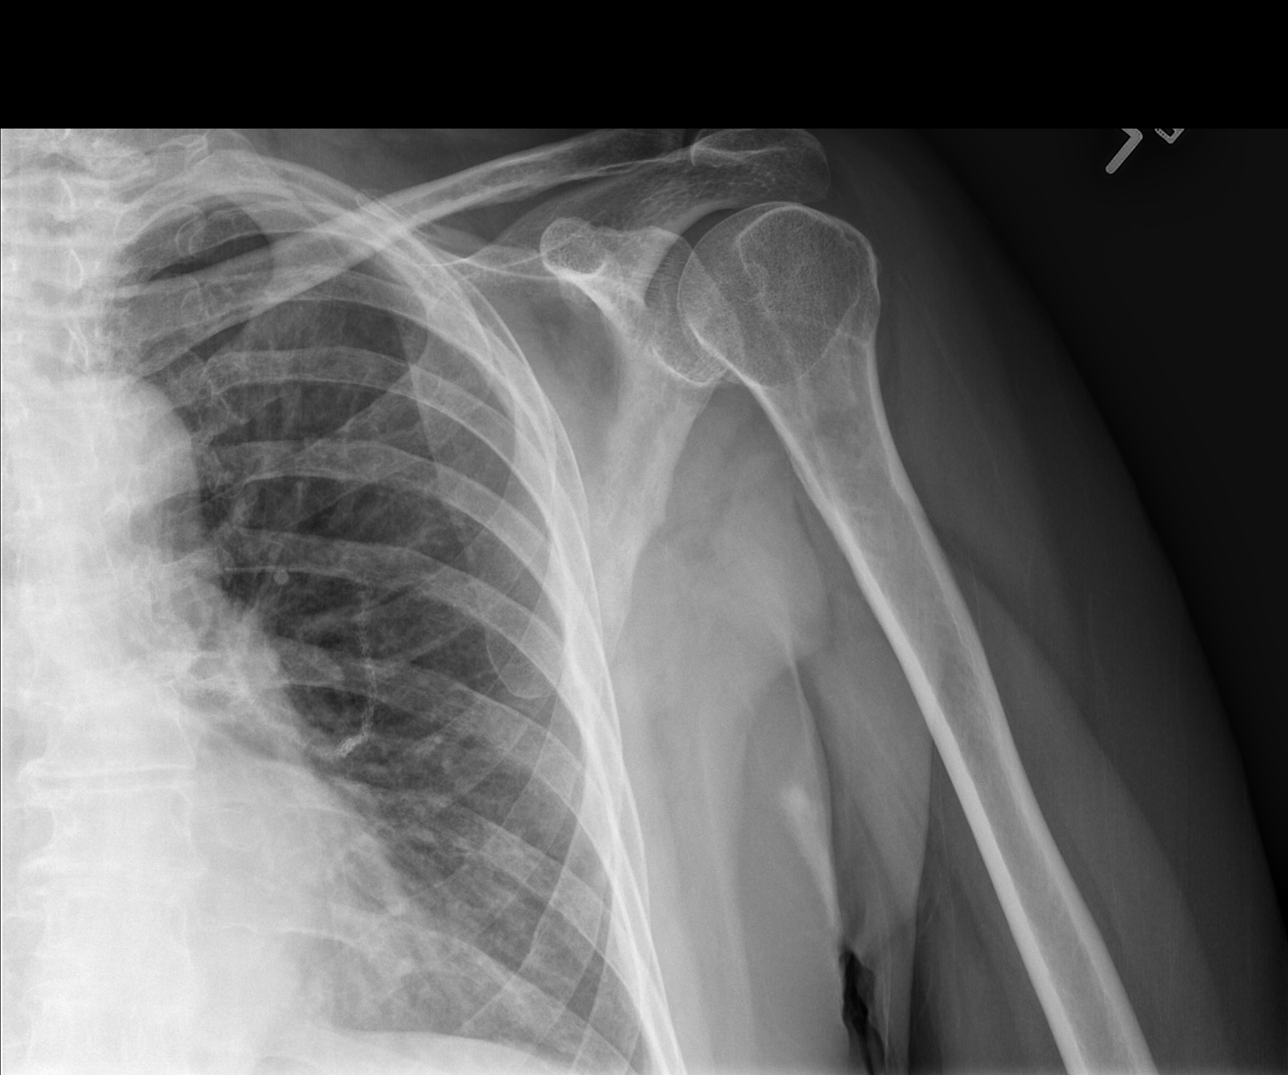

[w shoulder ap external left]
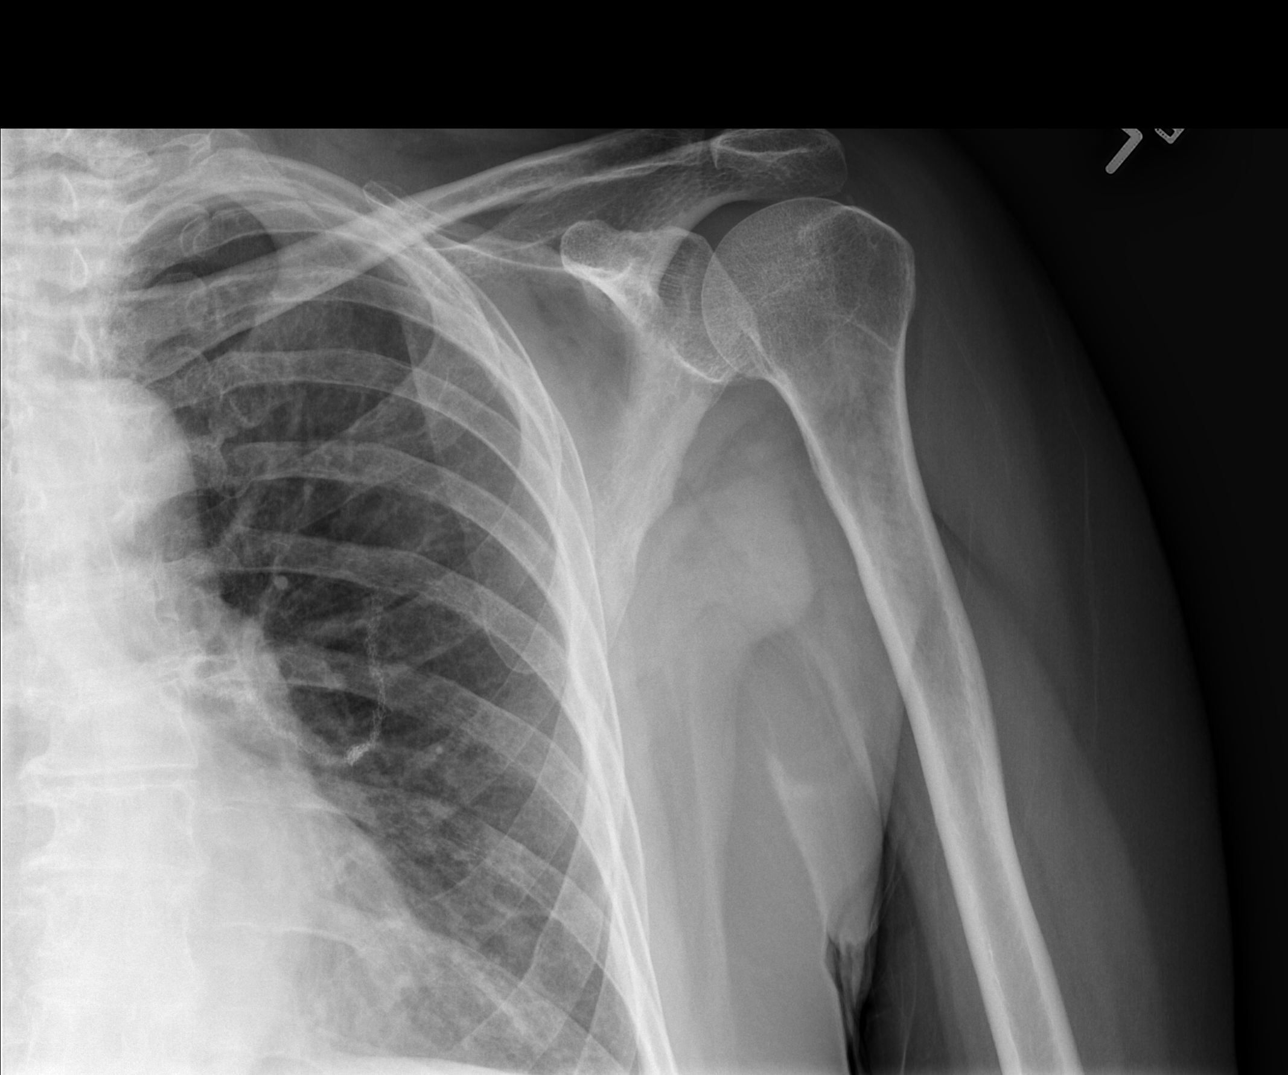

[w shoulder y view left]
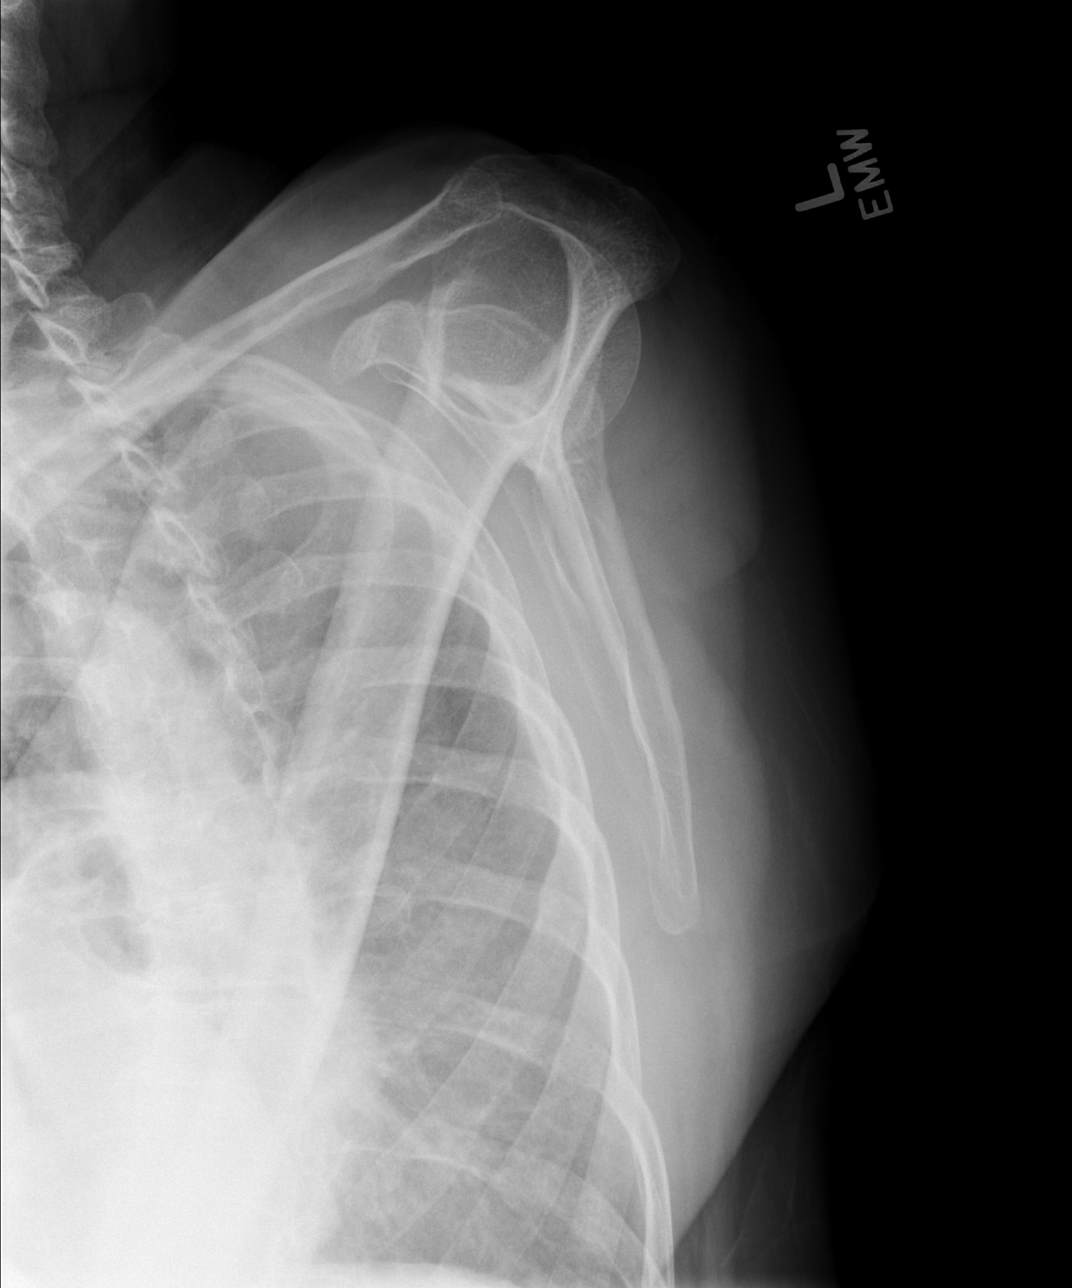

[3 of 3 positions shown; findings below may reference images not displayed]

FINDINGS: Alignment is normal.  Joint spaces are preserved.  No
fracture or dislocation is evident.  No soft tissue lesions are
seen.  No calcific bursitis or calcific tendonitis is seen.  No
bone destruction is evident.  No cervical rib is seen.
IMPRESSION: No left shoulder abnormality is identified.

## 2012-08-20 IMAGING — CT CT CHEST W/ CM
3 of 4 series · 15 of 30 positions shown, 16 images · IV contrast (75CC OMNI 300)
Comparison: CT chest 07/12/2010 and PET CT 03/17/2010.

CLINICAL DATA: Follow-up lung cancer.

CT CHEST WITH CONTRAST
TECHNIQUE: Multidetector CT imaging of the chest was performed
following the standard protocol during bolus administration of
intravenous contrast.
Contrast: 75 ml Mmnipaque-5II.

[Series 3: routine chest · axial · 0.78mm/px · z∈[-216,-51]mm · 4 of 56 slices shown, 5 images]
[im 12/56  mediastinal]
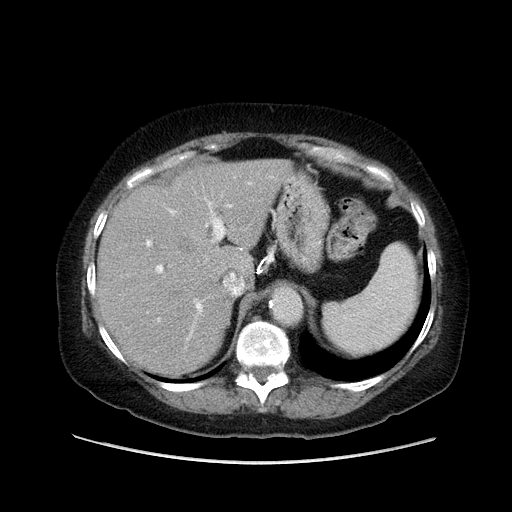
[im 12/56  lung]
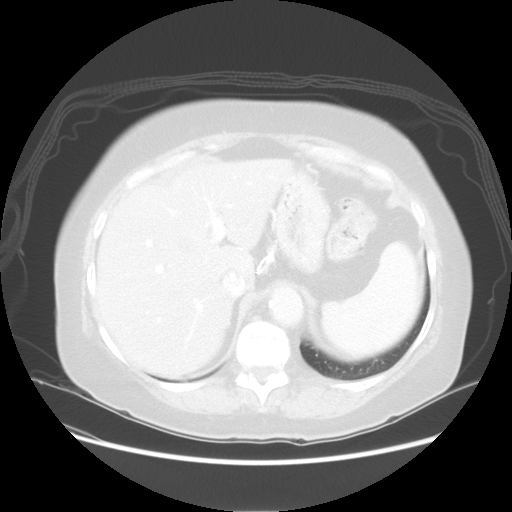
[im 23/56  lung]
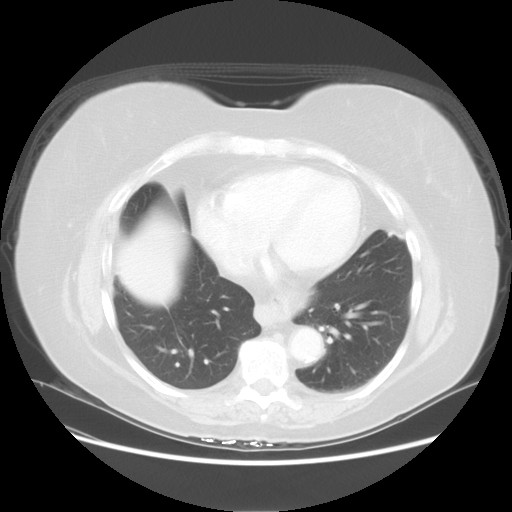
[im 34/56  lung]
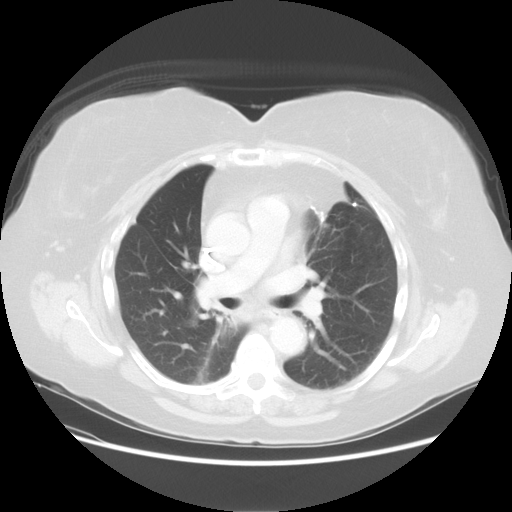
[im 45/56  lung]
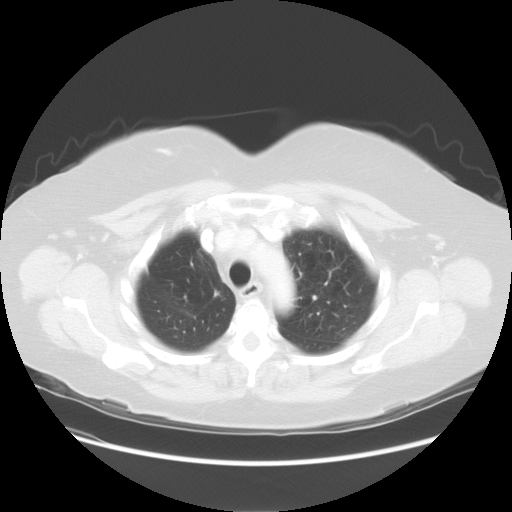

[Series 4: lung windows · axial · 0.78mm/px · z∈[-191,-61]mm · 3 of 52 slices shown]
[im 13/52  lung]
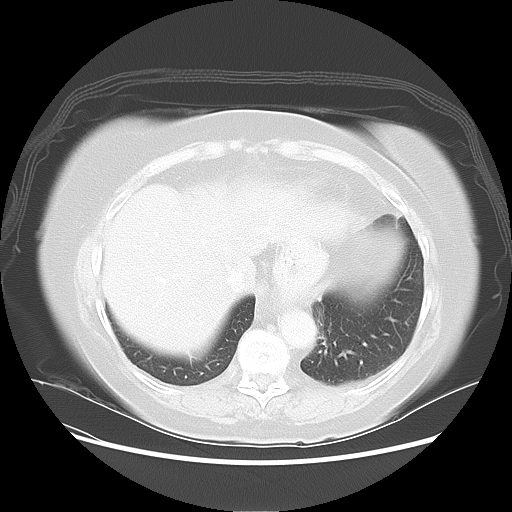
[im 26/52  lung]
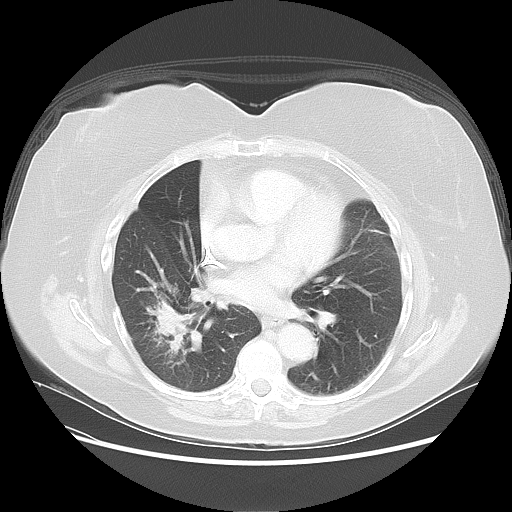
[im 39/52  lung]
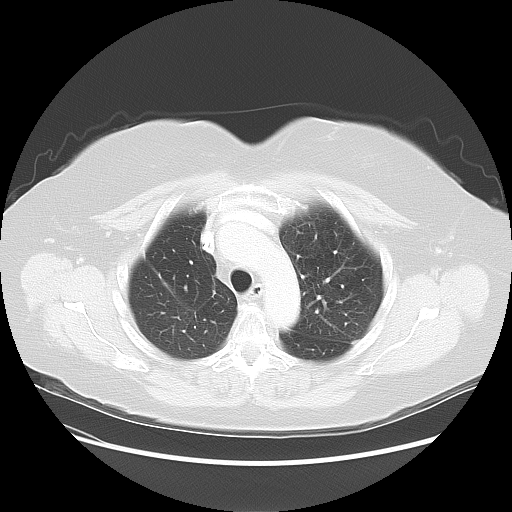

[Series 602: sagittal body · sagittal · 0.78mm/px · 8 of 160 slices shown]
[im 10/160  mediastinal]
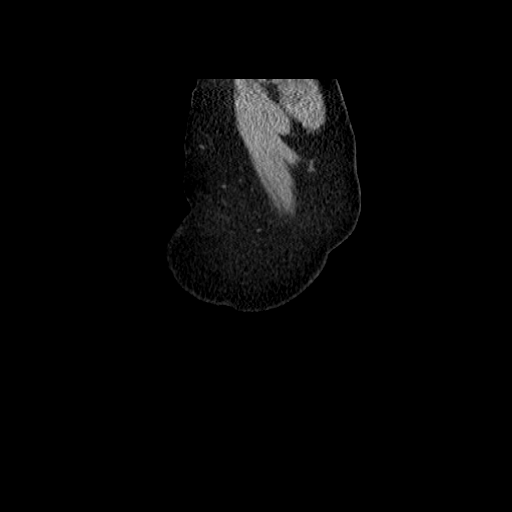
[im 30/160  mediastinal]
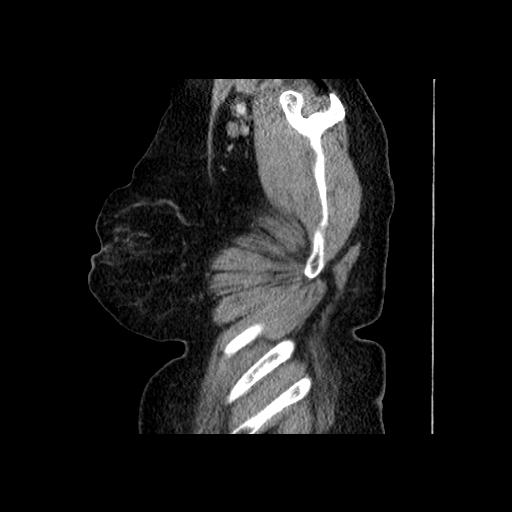
[im 50/160  mediastinal]
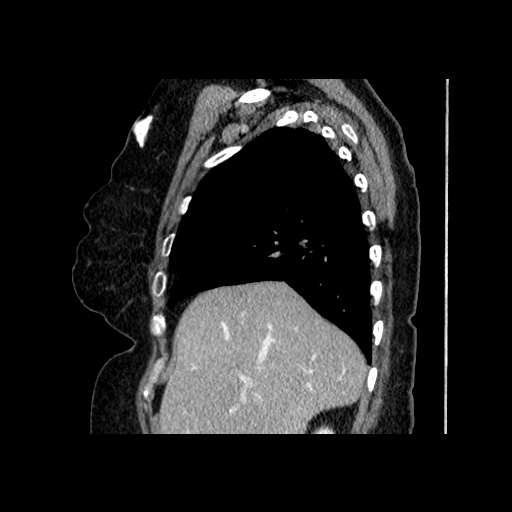
[im 70/160  mediastinal]
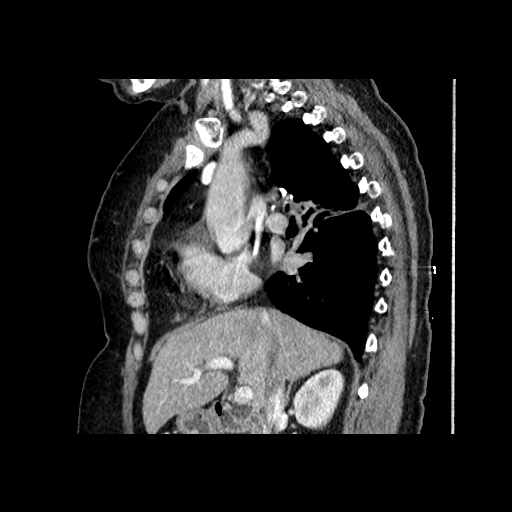
[im 90/160  mediastinal]
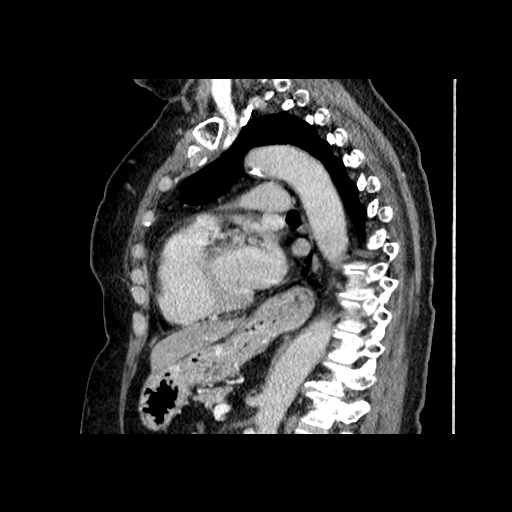
[im 110/160  mediastinal]
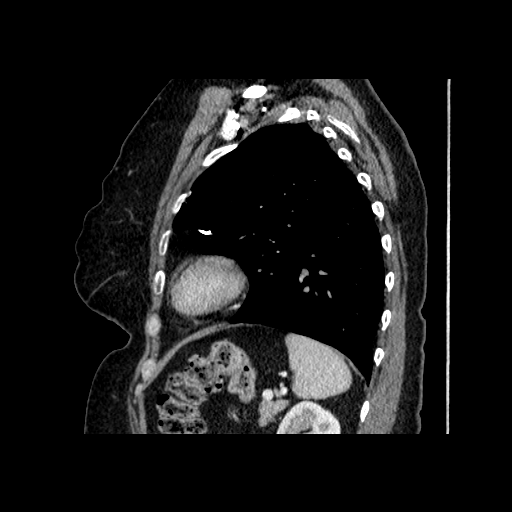
[im 130/160  mediastinal]
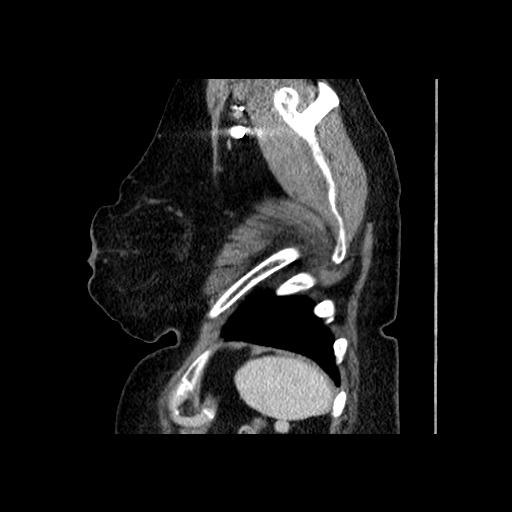
[im 150/160  mediastinal]
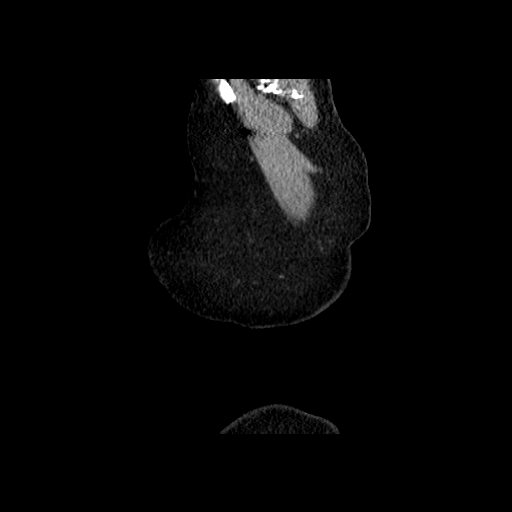

[15 of 30 positions shown; findings below may reference images not displayed]

FINDINGS: The chest wall is unremarkable and stable.  A right-sided
Port-A-Cath is noted.  No supraclavicular or axillary
lymphadenopathy.  Small thyroid nodules are again demonstrated.
The bony thorax is intact.  No destructive bony lesions or spinal
canal compromise.  Osteoporosis and degenerative changes are again
noted in the spine.

The heart is normal in size.  No pericardial effusion.  There are
small stable scattered mediastinal and hilar lymph nodes.  No
adenopathy.  The esophagus is grossly normal.  There is a moderate
sized hiatal hernia.  The aorta is normal in caliber.  No
dissection.

Examination of the lung parenchyma demonstrates stable surgical
changes bilaterally.  Stable changes of probable radiation fibrosis
involving the superior segment of the right lower lobe.  Stable
emphysematous changes.  In the right lower lobe there is a stable
area of ill-defined density and surrounding interstitial scarring
type changes.  This was not hot on the previous PET CT and is
likely scarring.  No new pulmonary lesions or acute pulmonary
findings.

The upper abdomen is unremarkable and stable.  Fatty infiltration
of the liver is noted.
IMPRESSION: 1.  Stable CT appearance of the chest.  There are surgical and
probable radiation changes without new/acute pulmonary findings or
new pulmonary nodules.
2.  Stable emphysematous changes.

## 2012-08-29 ENCOUNTER — Ambulatory Visit (HOSPITAL_BASED_OUTPATIENT_CLINIC_OR_DEPARTMENT_OTHER): Payer: 59 | Admitting: Hematology & Oncology

## 2012-08-29 ENCOUNTER — Other Ambulatory Visit (HOSPITAL_BASED_OUTPATIENT_CLINIC_OR_DEPARTMENT_OTHER): Payer: 59 | Admitting: Lab

## 2012-08-29 ENCOUNTER — Ambulatory Visit (HOSPITAL_BASED_OUTPATIENT_CLINIC_OR_DEPARTMENT_OTHER): Payer: Managed Care, Other (non HMO)

## 2012-08-29 DIAGNOSIS — Z452 Encounter for adjustment and management of vascular access device: Secondary | ICD-10-CM

## 2012-08-29 DIAGNOSIS — C3490 Malignant neoplasm of unspecified part of unspecified bronchus or lung: Secondary | ICD-10-CM

## 2012-08-29 LAB — COMPREHENSIVE METABOLIC PANEL
ALT: 13 U/L (ref 0–35)
CO2: 27 mEq/L (ref 19–32)
Calcium: 9.1 mg/dL (ref 8.4–10.5)
Chloride: 104 mEq/L (ref 96–112)
Creatinine, Ser: 0.67 mg/dL (ref 0.50–1.10)
Sodium: 139 mEq/L (ref 135–145)
Total Protein: 6.8 g/dL (ref 6.0–8.3)

## 2012-08-29 LAB — CBC WITH DIFFERENTIAL (CANCER CENTER ONLY)
BASO%: 1.2 % (ref 0.0–2.0)
Eosinophils Absolute: 0.2 10*3/uL (ref 0.0–0.5)
HCT: 37.3 % (ref 34.8–46.6)
LYMPH#: 1.8 10*3/uL (ref 0.9–3.3)
MONO#: 0.5 10*3/uL (ref 0.1–0.9)
NEUT%: 61.8 % (ref 39.6–80.0)
RBC: 4.27 10*6/uL (ref 3.70–5.32)
RDW: 14.9 % (ref 11.1–15.7)
WBC: 6.5 10*3/uL (ref 3.9–10.0)

## 2012-08-29 NOTE — Progress Notes (Signed)
Parke Simmers Gaultney presented for Portacath access and flush. Proper placement of portacath confirmed by CXR. Portacath located in the left chest wall accessed with  H 20 needle. Clean, Dry and Intact Good blood return present. Portacath flushed with 20ml NS and 500U/36ml Heparin per protocol and needle removed intact. Procedure without incident. Patient tolerated procedure well.

## 2012-08-29 NOTE — Progress Notes (Signed)
This office note has been dictated.

## 2012-08-30 NOTE — Progress Notes (Signed)
CC:   Keturah Barre, MD Garey Ham, MD Tanvir A. Chodri, M.D. Carlton Adam, MD  DIAGNOSIS: 1. Locally recurrent adenocarcinoma of the lung-clinical remission. 2. Post thoracotomy syndrome of the right chest wall. 3. Hiatal hernia.  CURRENT THERAPY:  Observation.  INTERIM HISTORY:  [name] comes in for followup.  She is doing okay. She is still having issues with respect to the right chest wall pain.  She has a little bit of a cough.  She does state rarely there is some blood when she coughs.  As such, I think we are going to have to re- evaluate her chest with a CT scan.  The last scan that we had done on her was done back in December 2013.  She still has issues with her hiatal hernia.  She is not sure if she wants to go see a Careers adviser or not.  She is worried about her weight going up and she wants to be on a diet pill.  I just told her I was not well versed in diet pills and their side effects.  She has had no leg swelling.  There has been no rashes.  The patient has had no headache.  PHYSICAL EXAMINATION:  General:  This is a well-developed, well- nourished white female in no obvious distress.  Vital signs: Temperature of 98, pulse 67, respiratory rate 16, blood pressure 114/75. Weight is 162.  Head and neck:  Normocephalic, atraumatic skull.  There are no ocular or oral lesions.  There are no palpable cervical or supraclavicular lymph nodes.  Lungs:  Clear bilaterally.  Cardiac: Regular rate and rhythm with a normal S1, S2.  There are no murmurs, rubs or bruits.  Abdomen:  Soft.  She has good bowel sounds.  There is no fluid wave.  There is no palpable hepatosplenomegaly.  Back:  Shows some tenderness on the right lateral chest wall which is chronic.  No swelling is noted.  Extremities:  No clubbing, cyanosis or edema. Neurological:  Shows no focal neurological deficits.  LABORATORY STUDIES:  White cell count 6.5, hemoglobin 12.2, hematocrit 37.3, platelet count  187,000.  IMPRESSION:  Ms. Better is a very nice 62 year old white female with history of locally recurrent adenocarcinoma of the lung.  Again, I am worried about this cough with some blood.  As such, we are going to have to re-evaluate her with a CT scan of the chest.  Of note, she completed her last course of chemotherapy and radiation therapy back in April 2012.  We will get the scan set up for a couple weeks.  I will then plan to get her back to see me after Labor Day unless something is on the scan that we need to see her sooner for.    ______________________________ Josph Macho, M.D. PRE/MEDQ  D:  08/29/2012  T:  08/30/2012  Job:  [id number]

## 2012-09-03 ENCOUNTER — Ambulatory Visit
Admission: RE | Admit: 2012-09-03 | Discharge: 2012-09-03 | Disposition: A | Discharge status: Home / Self Care | Payer: Managed Care, Other (non HMO) | Source: Ambulatory Visit | Attending: Hematology & Oncology | Admitting: Hematology & Oncology

## 2012-09-03 MED ORDER — IOHEXOL 300 MG/ML  SOLN
75.0000 mL | Freq: Once | INTRAMUSCULAR | Status: AC | PRN
  Administered 2012-09-03: 75 mL via INTRAVENOUS

## 2012-09-04 ENCOUNTER — Telehealth: Payer: Self-pay | Admitting: *Deleted

## 2012-09-04 NOTE — Telephone Encounter (Signed)
Message copied by Anselm Jungling on Tue Sep 04, 2012  8:58 AM ------      Message from: Arlan Organ R      Created: Mon Sep 03, 2012  7:12 PM       Call - no obvious lung cancer coming back!!  Cindee Lame ------

## 2012-09-04 NOTE — Telephone Encounter (Signed)
Called patient to let her know that her CT scan was negative for lung cancer.  Labwork looks good per dr. Myna Hidalgo

## 2012-10-19 ENCOUNTER — Ambulatory Visit (HOSPITAL_BASED_OUTPATIENT_CLINIC_OR_DEPARTMENT_OTHER): Payer: 59

## 2012-10-19 ENCOUNTER — Telehealth: Payer: Self-pay | Admitting: Hematology & Oncology

## 2012-10-19 ENCOUNTER — Ambulatory Visit (HOSPITAL_BASED_OUTPATIENT_CLINIC_OR_DEPARTMENT_OTHER): Payer: 59 | Admitting: Hematology & Oncology

## 2012-10-19 ENCOUNTER — Ambulatory Visit (HOSPITAL_BASED_OUTPATIENT_CLINIC_OR_DEPARTMENT_OTHER): Payer: 59 | Admitting: Lab

## 2012-10-19 DIAGNOSIS — Z85118 Personal history of other malignant neoplasm of bronchus and lung: Secondary | ICD-10-CM

## 2012-10-19 LAB — CBC WITH DIFFERENTIAL (CANCER CENTER ONLY)
BASO%: 1.1 % (ref 0.0–2.0)
LYMPH#: 2 10*3/uL (ref 0.9–3.3)
MONO#: 0.5 10*3/uL (ref 0.1–0.9)
Platelets: 185 10*3/uL (ref 145–400)
RDW: 14.6 % (ref 11.1–15.7)
WBC: 7 10*3/uL (ref 3.9–10.0)

## 2012-10-19 LAB — COMPREHENSIVE METABOLIC PANEL
ALT: 16 U/L (ref 0–35)
AST: 17 U/L (ref 0–37)
Alkaline Phosphatase: 64 U/L (ref 39–117)
Creatinine, Ser: 0.57 mg/dL (ref 0.50–1.10)
Sodium: 140 mEq/L (ref 135–145)
Total Bilirubin: 0.3 mg/dL (ref 0.3–1.2)
Total Protein: 6.7 g/dL (ref 6.0–8.3)

## 2012-10-19 MED ORDER — SODIUM CHLORIDE 0.9 % IJ SOLN
10.0000 mL | INTRAMUSCULAR | Status: DC | PRN
  Administered 2012-10-19: 10 mL via INTRAVENOUS
  Filled 2012-10-19: qty 10

## 2012-10-19 MED ORDER — HEPARIN SOD (PORK) LOCK FLUSH 100 UNIT/ML IV SOLN
500.0000 [IU] | Freq: Once | INTRAVENOUS | Status: AC | PRN
  Administered 2012-10-19: 500 [IU] via INTRAVENOUS
  Filled 2012-10-19: qty 5

## 2012-10-19 MED ORDER — ALTEPLASE 2 MG IJ SOLR
2.0000 mg | Freq: Once | INTRAMUSCULAR | Status: DC | PRN
  Filled 2012-10-19: qty 2

## 2012-10-19 NOTE — Progress Notes (Signed)
This office note has been dictated.

## 2012-10-19 NOTE — Patient Instructions (Signed)
Implanted Port Instructions  An implanted port is a central line that has a round shape and is placed under the skin. It is used for long-term IV (intravenous) access for:  · Medicine.  · Fluids.  · Liquid nutrition, such as TPN (total parenteral nutrition).  · Blood samples.  Ports can be placed:  · In the chest area just below the collarbone (this is the most common place.)  · In the arms.  · In the belly (abdomen) area.  · In the legs.  PARTS OF THE PORT  A port has 2 main parts:  · The reservoir. The reservoir is round, disc-shaped, and will be a small, raised area under your skin.  · The reservoir is the part where a needle is inserted (accessed) to either give medicines or to draw blood.  · The catheter. The catheter is a long, slender tube that extends from the reservoir. The catheter is placed into a large vein.  · Medicine that is inserted into the reservoir goes into the catheter and then into the vein.  INSERTION OF THE PORT  · The port is surgically placed in either an operating room or in a procedural area (interventional radiology).  · Medicine may be given to help you relax during the procedure.  · The skin where the port will be inserted is numbed (local anesthetic).  · 1 or 2 small cuts (incisions) will be made in the skin to insert the port.  · The port can be used after it has been inserted.  INCISION SITE CARE  · The incision site may have small adhesive strips on it. This helps keep the incision site closed. Sometimes, no adhesive strips are placed. Instead of adhesive strips, a special kind of surgical glue is used to keep the incision closed.  · If adhesive strips were placed on the incision sites, do not take them off. They will fall off on their own.  · The incision site may be sore for 1 to 2 days. Pain medicine can help.  · Do not get the incision site wet. Bathe or shower as directed by your caregiver.  · The incision site should heal in 5 to 7 days. A small scar may form after the  incision has healed.  ACCESSING THE PORT  Special steps must be taken to access the port:  · Before the port is accessed, a numbing cream can be placed on the skin. This helps numb the skin over the port site.  · A sterile technique is used to access the port.  · The port is accessed with a needle. Only "non-coring" port needles should be used to access the port. Once the port is accessed, a blood return should be checked. This helps ensure the port is in the vein and is not clogged (clotted).  · If your caregiver believes your port should remain accessed, a clear (transparent) bandage will be placed over the needle site. The bandage and needle will need to be changed every week or as directed by your caregiver.  · Keep the bandage covering the needle clean and dry. Do not get it wet. Follow your caregiver's instructions on how to take a shower or bath when the port is accessed.  · If your port does not need to stay accessed, no bandage is needed over the port.  FLUSHING THE PORT  Flushing the port keeps it from getting clogged. How often the port is flushed depends on:  · If a   constant infusion is running. If a constant infusion is running, the port may not need to be flushed.  · If intermittent medicines are given.  · If the port is not being used.  For intermittent medicines:  · The port will need to be flushed:  · After medicines have been given.  · After blood has been drawn.  · As part of routine maintenance.  · A port is normally flushed with:  · Normal saline.  · Heparin.  · Follow your caregiver's advice on how often, how much, and the type of flush to use on your port.  IMPORTANT PORT INFORMATION  · Tell your caregiver if you are allergic to heparin.  · After your port is placed, you will get a manufacturer's information card. The card has information about your port. Keep this card with you at all times.  · There are many types of ports available. Know what kind of port you have.  · In case of an  emergency, it may be helpful to wear a medical alert bracelet. This can help alert health care workers that you have a port.  · The port can stay in for as long as your caregiver believes it is necessary.  · When it is time for the port to come out, surgery will be done to remove it. The surgery will be similar to how the port was put in.  · If you are in the hospital or clinic:  · Your port will be taken care of and flushed by a nurse.  · If you are at home:  · A home health care nurse may give medicines and take care of the port.  · You or a family member can get special training and directions for giving medicine and taking care of the port at home.  SEEK IMMEDIATE MEDICAL CARE IF:   · Your port does not flush or you are unable to get a blood return.  · New drainage or pus is coming from the incision.  · A bad smell is coming from the incision site.  · You develop swelling or increased redness at the incision site.  · You develop increased swelling or pain at the port site.  · You develop swelling or pain in the surrounding skin near the port.  · You have an oral temperature above 102° F (38.9° C), not controlled by medicine.  MAKE SURE YOU:   · Understand these instructions.  · Will watch your condition.  · Will get help right away if you are not doing well or get worse.  Document Released: 01/24/2005 Document Revised: 04/18/2011 Document Reviewed: 04/17/2008  Exit[employer]® Patient Information ©2014 [employer].

## 2012-10-19 NOTE — Addendum Note (Signed)
Addended by: Arlan Organ R on: 10/19/2012 02:21 PM   Modules accepted: Orders

## 2012-10-19 NOTE — Telephone Encounter (Signed)
Pt aware of 9-17 MRI at 230 pm, She said she would call them and change appointment time.

## 2012-10-20 NOTE — Progress Notes (Signed)
CC:   Keturah Barre, MD Tanvir A. Chodri, M.D. Carlton Adam, MD  DIAGNOSES: 1. Locally recurrent adenocarcinoma of the lung, clinical remission. 2. Post thoracotomy chest wall syndrome on the right side. 3. Hiatal hernia.  CURRENT THERAPY:  Observation.  INTERIM HISTORY:  Ms. Scharf comes in for followup.  She is doing okay. Unfortunately, her family members have been having their health issues. Her mother now is having some back problems.  Ms. Ishibashi did have a CT of the chest done.  This was done on July 28th. The CT scan did not show any evidence of recurrent disease.  She has 2 nonspecific nodules in the right lower lobe.  These were less than 5 mm. This will need to be followed as indicated clinically.  She has had no cough.  She has had chronic right chest wall pain.  This has been a little "thorn in her side" for several years.  She has had a hiatal hernia.  This has caused her some issues.  She is eating okay.  She does watch what she eats because of the hiatal hernia. There has been no problem with bowels or bladder.  She has had no leg swelling.  She has had no rashes.  She has had no headache.  She does have problems with having cerebral aneurysms. Thankfully, these have not caused her any problems.  PHYSICAL EXAMINATION:  General:  This is a well-developed, well- nourished, white female, in no obvious distress.  Vital signs:  Show a temperature of 98.1, pulse 78, respiratory rate 16, blood pressure 145/78.  Weight is 162.  Head and neck:  Exam shows a normocephalic, atraumatic skull.  She has no ocular or oral lesions.  There are no palpable cervical or supraclavicular lymph nodes.  Lungs:  Clear bilaterally.  She has no rales, wheezes, or rhonchi.  Cardiac:  Regular rate and rhythm with a normal S1 and S2.  There are no murmurs, rubs or bruits.  Abdomen:  Soft.  She has good bowel sounds.  There is some slight tenderness in the epigastric region.  There is no  fluid wave. There is no palpable hepatosplenomegaly.  Back:  Exam shows some chronic tenderness over the right lateral chest wall.  There is no swelling or erythema noted in this area.  Extremities:  Show good range of motion of her joints.  She has good strength in her joints and muscles bilaterally.  There is no swelling of her legs.  She has no rashes.  She has good pulses.  Neurological:  No focal neurological deficit.  LABORATORY STUDIES: 1. White cell count is 7, hemoglobin 12.7, hematocrit 38, platelet     count 185.  IMPRESSION:  Ms. Aki is a very charming, 62 year old white female. She initially presented with her localized lung cancer back in October of 2005.  She has done very well since then.  She has had recurrences, but these have been treated with radiation.  For now, I do not think we have to do any scans on her.  She is asymptomatic.  Of note, we last completed treatment on her in April of 2012.  We will go ahead and get her back in 3 more months.  We will go ahead and flush her Port-A-Cath today and in between visits.  ADDENDUM:  I forgot to mention that Ms. Klippel is going to a chiropractic.  She is having some neck issues.  She has had some chronic neck and hip issues.  Her chiropractor  seems to be helping out quite a bit.    ______________________________ Josph Macho, M.D. PRE/MEDQ  D:  10/18/2012  T:  10/20/2012  Job:  [id number]

## 2012-10-22 ENCOUNTER — Telehealth: Payer: Self-pay | Admitting: Hematology & Oncology

## 2012-10-22 NOTE — Telephone Encounter (Signed)
Per pt requests mailed oct and dec schedule

## 2012-10-23 ENCOUNTER — Ambulatory Visit
Admission: RE | Admit: 2012-10-23 | Discharge: 2012-10-23 | Disposition: A | Discharge status: Home / Self Care | Payer: Managed Care, Other (non HMO) | Source: Ambulatory Visit | Attending: Hematology & Oncology | Admitting: Hematology & Oncology

## 2012-10-23 MED ORDER — GADOBENATE DIMEGLUMINE 529 MG/ML IV SOLN
15.0000 mL | Freq: Once | INTRAVENOUS | Status: AC | PRN
  Administered 2012-10-23: 15 mL via INTRAVENOUS

## 2012-10-24 ENCOUNTER — Other Ambulatory Visit: Payer: 59

## 2012-10-25 ENCOUNTER — Telehealth: Payer: Self-pay | Admitting: Hematology & Oncology

## 2012-10-25 NOTE — Telephone Encounter (Signed)
Pt called to have FMLA papers for her husband ([employer]) mailed today to her home.   COPY SCANNED

## 2012-12-03 ENCOUNTER — Ambulatory Visit (HOSPITAL_BASED_OUTPATIENT_CLINIC_OR_DEPARTMENT_OTHER): Payer: 59

## 2012-12-03 VITALS — BP 122/78 | HR 84 | Temp 97.8°F | Resp 18

## 2012-12-03 DIAGNOSIS — C3490 Malignant neoplasm of unspecified part of unspecified bronchus or lung: Secondary | ICD-10-CM

## 2012-12-03 DIAGNOSIS — Z452 Encounter for adjustment and management of vascular access device: Secondary | ICD-10-CM

## 2012-12-03 DIAGNOSIS — C3410 Malignant neoplasm of upper lobe, unspecified bronchus or lung: Secondary | ICD-10-CM

## 2012-12-03 MED ORDER — HEPARIN SOD (PORK) LOCK FLUSH 100 UNIT/ML IV SOLN
500.0000 [IU] | Freq: Once | INTRAVENOUS | Status: AC
  Administered 2012-12-03: 500 [IU] via INTRAVENOUS
  Filled 2012-12-03: qty 5

## 2012-12-03 MED ORDER — SODIUM CHLORIDE 0.9 % IJ SOLN
10.0000 mL | INTRAMUSCULAR | Status: DC | PRN
  Administered 2012-12-03: 10 mL via INTRAVENOUS
  Filled 2012-12-03: qty 10

## 2012-12-03 MED ORDER — SODIUM CHLORIDE 0.9 % IJ SOLN
10.0000 mL | INTRAMUSCULAR | Status: DC | PRN
  Filled 2012-12-03: qty 10

## 2012-12-10 IMAGING — CT CT CHEST W/ CM
3 of 4 series · 15 of 30 positions shown, 16 images · IV contrast (75CC OMNI 300)
Comparison: 10/06/2010

CLINICAL DATA: Restaging lung cancer.  Chest pain.

CT CHEST WITH CONTRAST
TECHNIQUE: Multidetector CT imaging of the chest was performed
following the standard protocol during bolus administration of
intravenous contrast.
Contrast: 75mL OMNIPAQUE IOHEXOL 300 MG/ML IV SOLN

[Series 3: chest with · axial · 0.77mm/px · z∈[-168,-2]mm · 4 of 56 slices shown, 5 images]
[im 12/56  mediastinal]
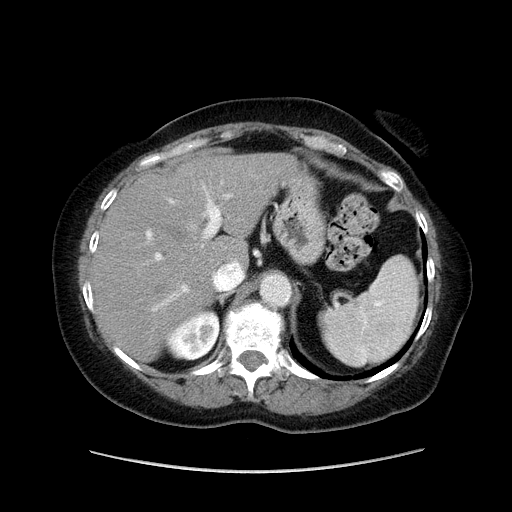
[im 12/56  lung]
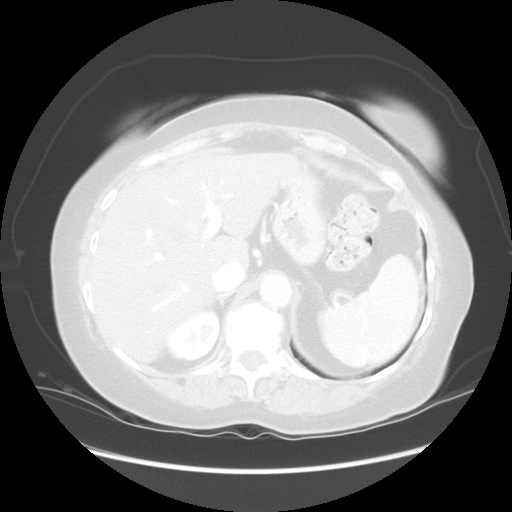
[im 23/56  lung]
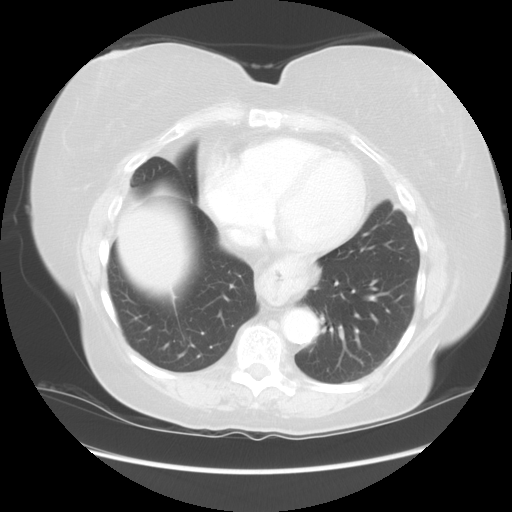
[im 34/56  lung]
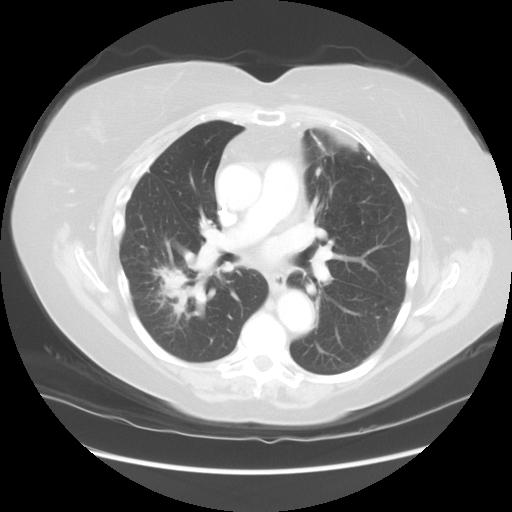
[im 45/56  lung]
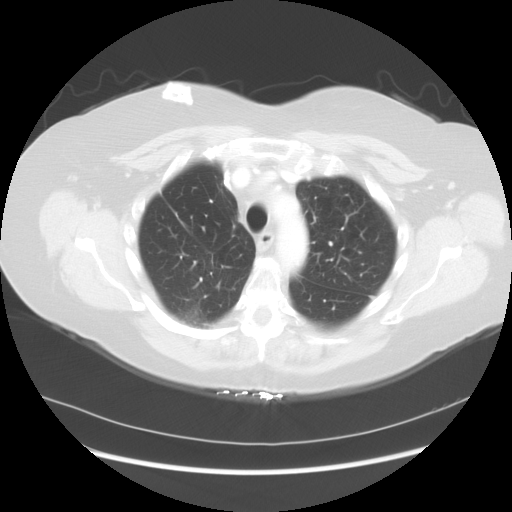

[Series 4: lung windows · axial · 0.77mm/px · z∈[-132,-12]mm · 3 of 50 slices shown]
[im 13/50  lung]
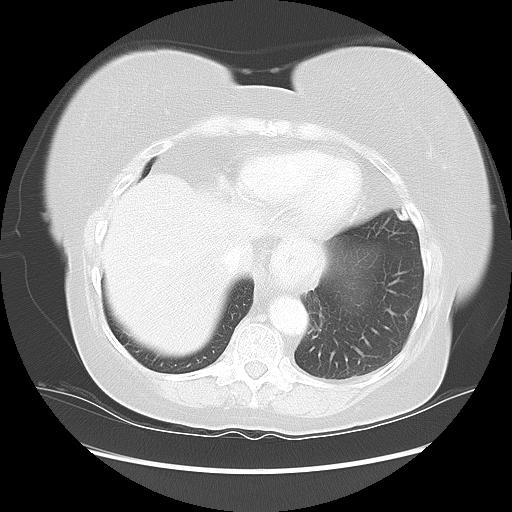
[im 25/50  lung]
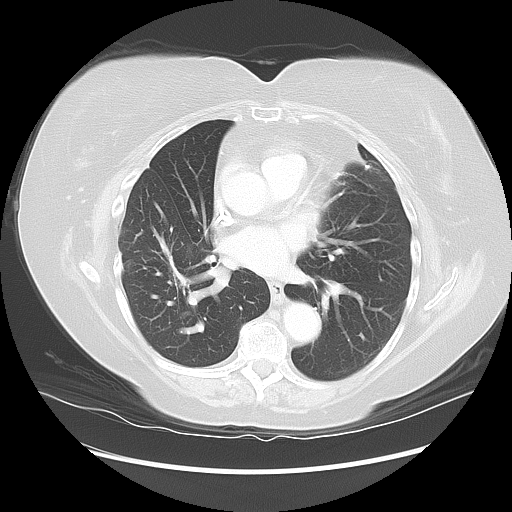
[im 37/50  lung]
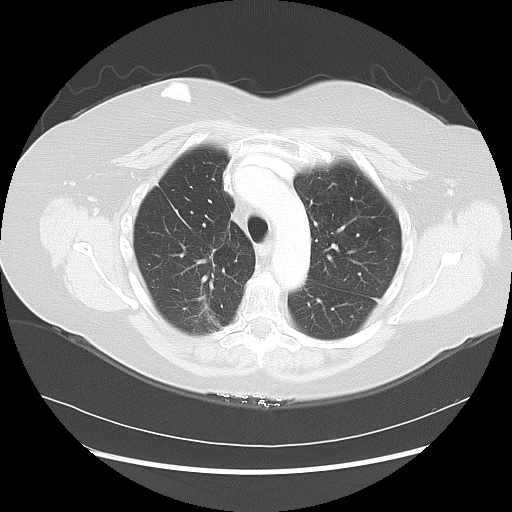

[Series 602: sagittal body · sagittal · 0.77mm/px · 8 of 160 slices shown]
[im 10/160  mediastinal]
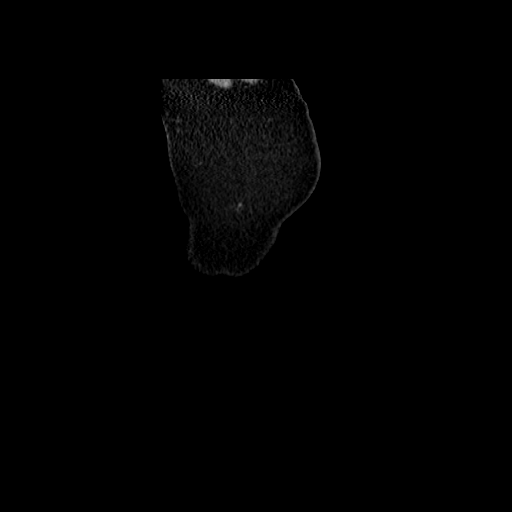
[im 30/160  mediastinal]
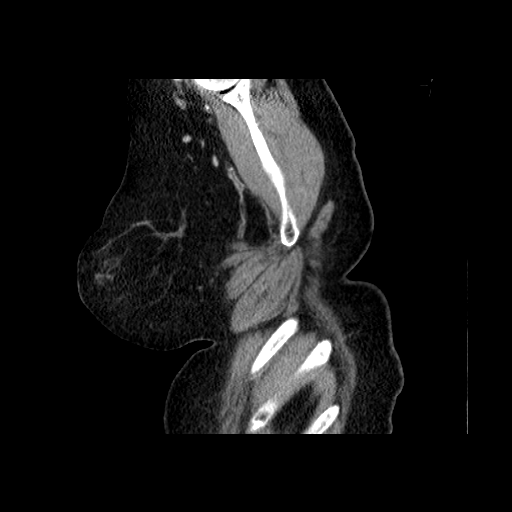
[im 50/160  mediastinal]
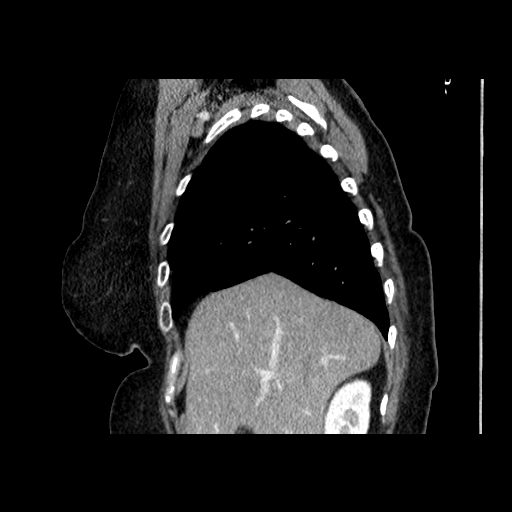
[im 70/160  mediastinal]
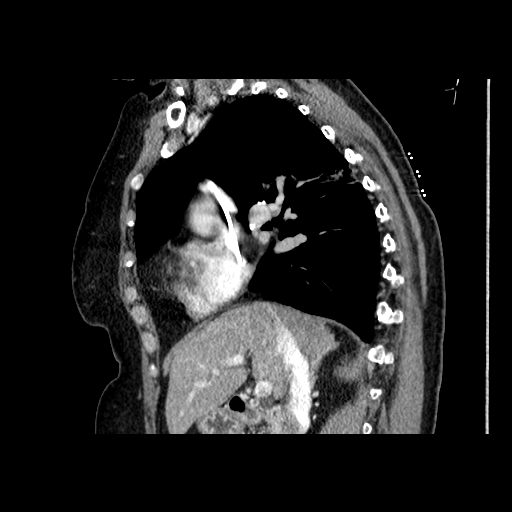
[im 90/160  mediastinal]
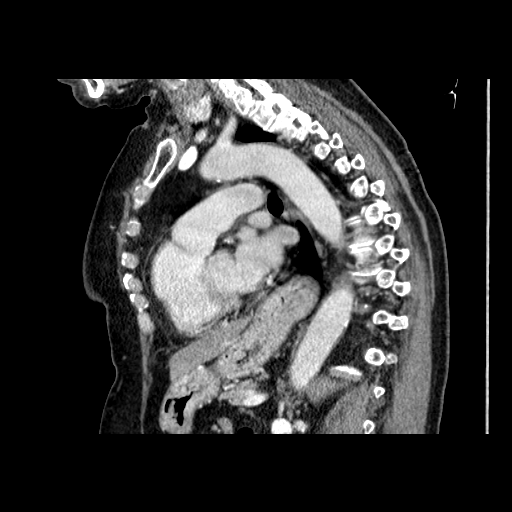
[im 110/160  mediastinal]
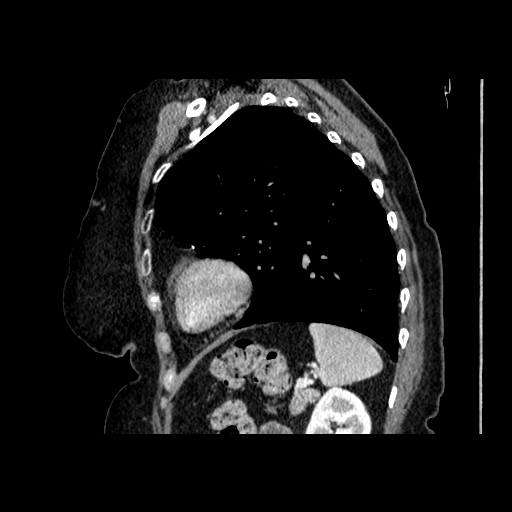
[im 130/160  mediastinal]
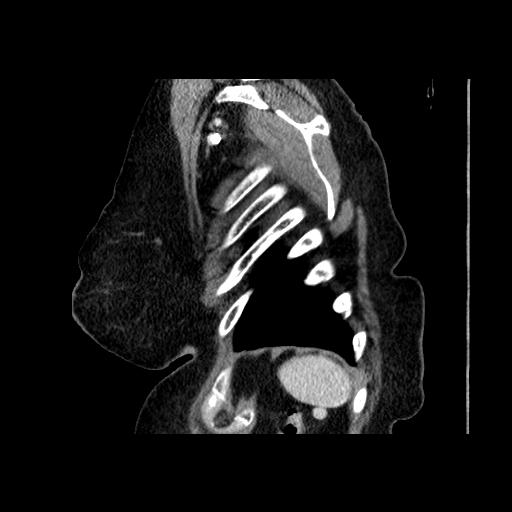
[im 150/160  mediastinal]
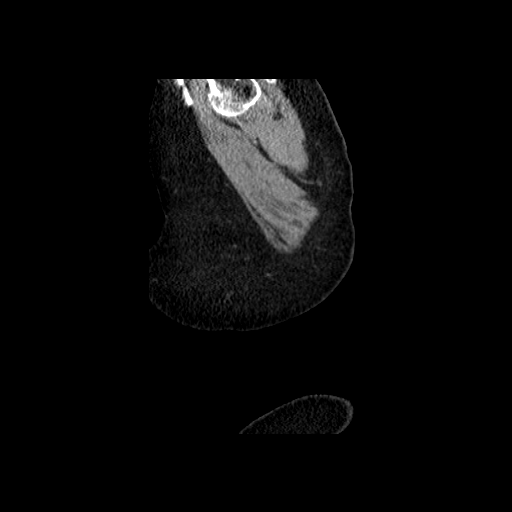

[15 of 30 positions shown; findings below may reference images not displayed]

FINDINGS: Mediastinal lymph nodes are not enlarged by CT size
criteria.  No hilar or axillary adenopathy.  Heart size normal.  No
pericardial effusion.  Small hiatal hernia.

Postoperative changes of right upper lobectomy.  Radiation fibrosis
and volume loss are seen in the posteromedial right hemithorax.
Confluent airspace opacity in the right lower lobe (image 23) is
unchanged from the prior study. Postoperative changes and scarring
in the left upper lobe.  Emphysema.  No pleural fluid.  Airway is
otherwise unremarkable.

Incidental imaging of the upper abdomen shows surgical clips in the
gastrohepatic ligament and porta hepatis. Liver appears slightly
decreased attenuation diffusely.  No worrisome lytic or sclerotic
lesions.
IMPRESSION: 1.  Postoperative changes in the hemithoraces bilaterally, without
evidence of recurrent or metastatic disease.
2.  Confluent airspace opacity in the right lower lobe is unchanged
from the prior study.  Continued attention on follow-up exams is
warranted.
3.  Suspect hepatic steatosis.

## 2012-12-20 IMAGING — CR DG LUMBAR SPINE COMPLETE 4+V
5 series · 5 of 5 positions shown · non-contrast
Comparison: PET CT 03/17/2010.  Abdominal CT 11/17/2008.

CLINICAL DATA: Low back pain for 3 weeks.  History of lung cancer.

LUMBAR SPINE - COMPLETE 4+ VIEW

[t l-spine a.p.]
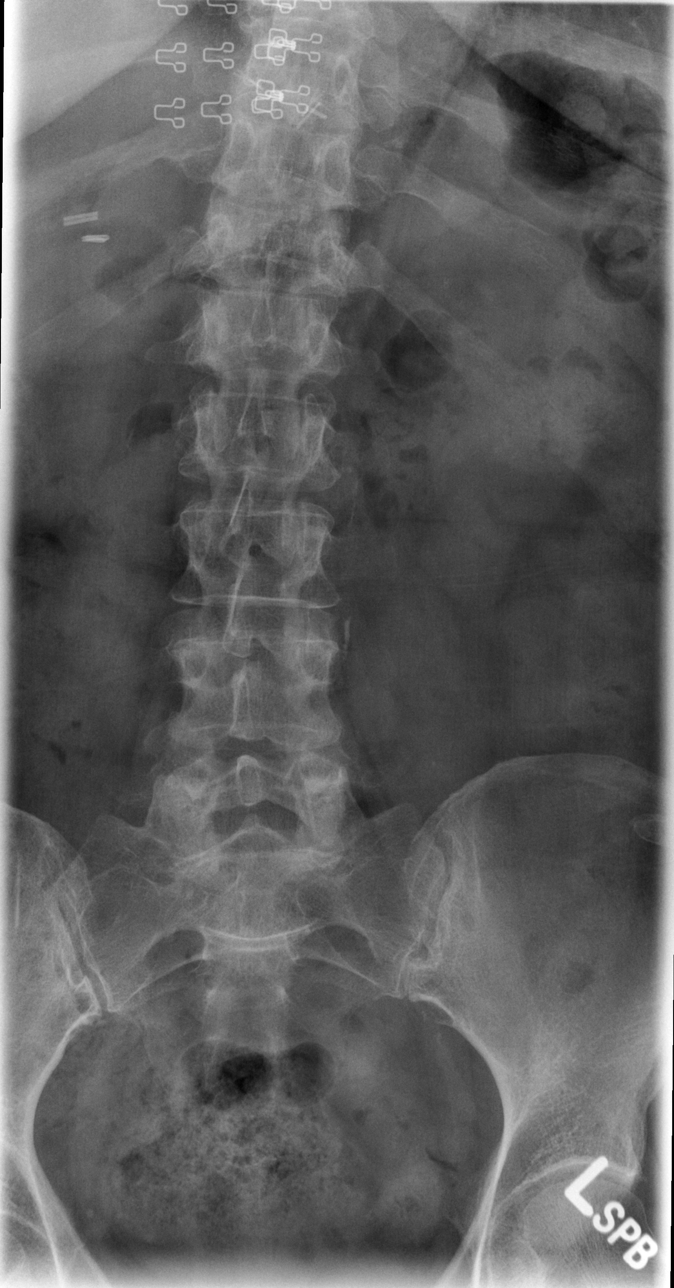

[t l-spine oblique exposure (1 of 2)]
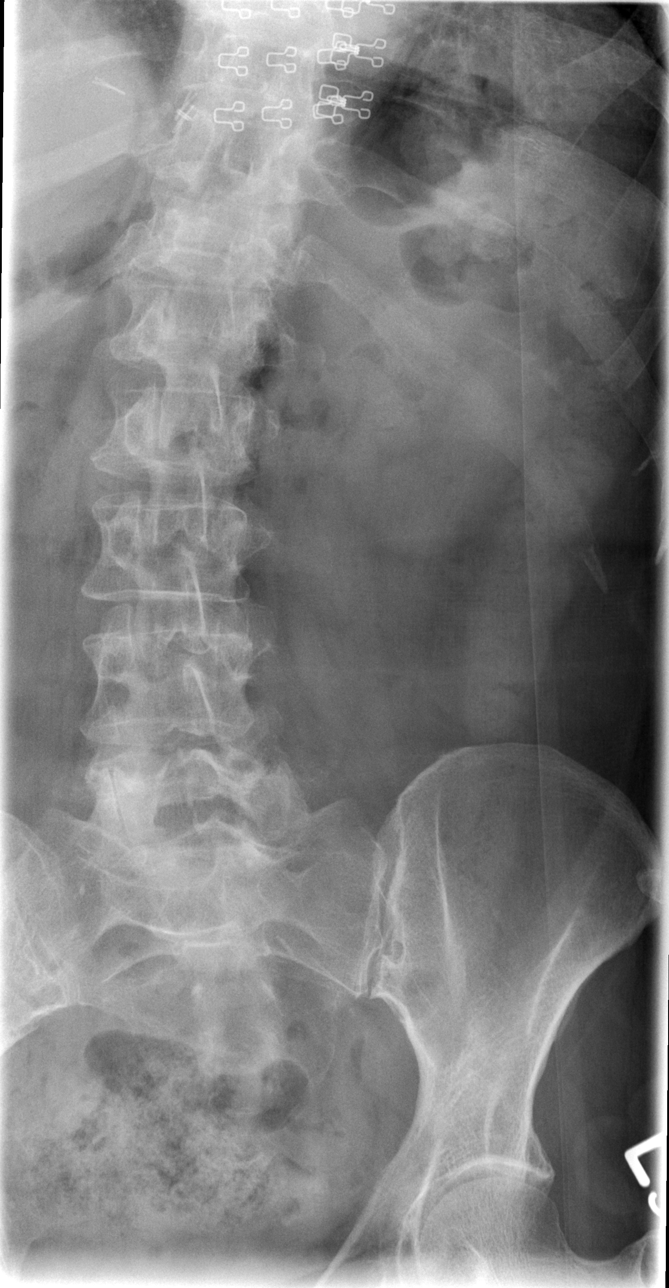

[t l-spine oblique exposure (2 of 2)]
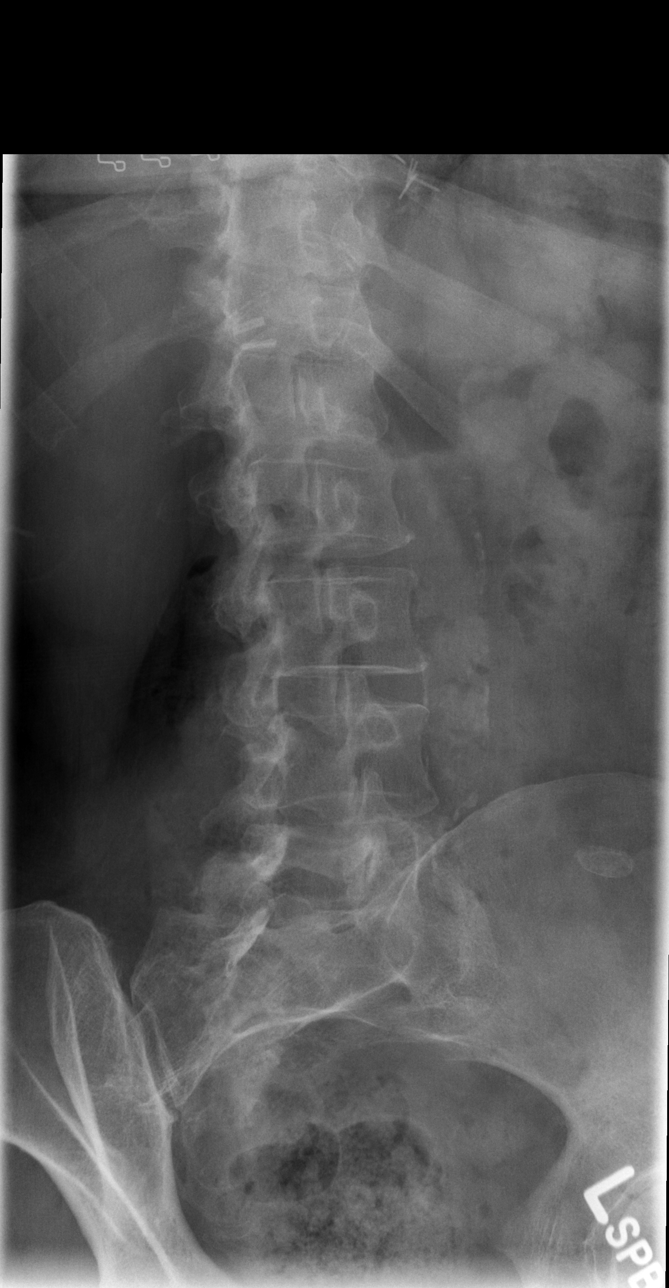

[t l-spine lat]
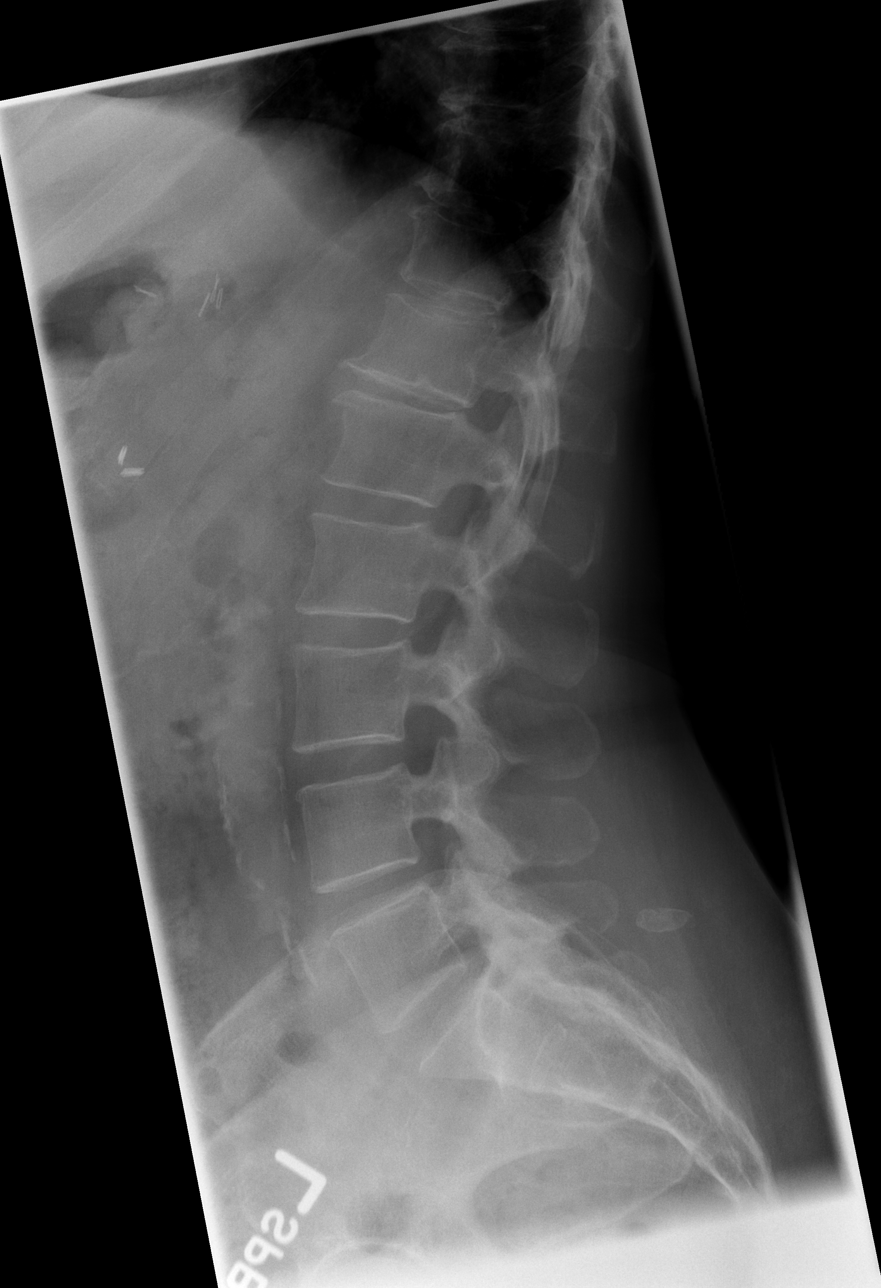

[t l-spine l5-s1 spot]
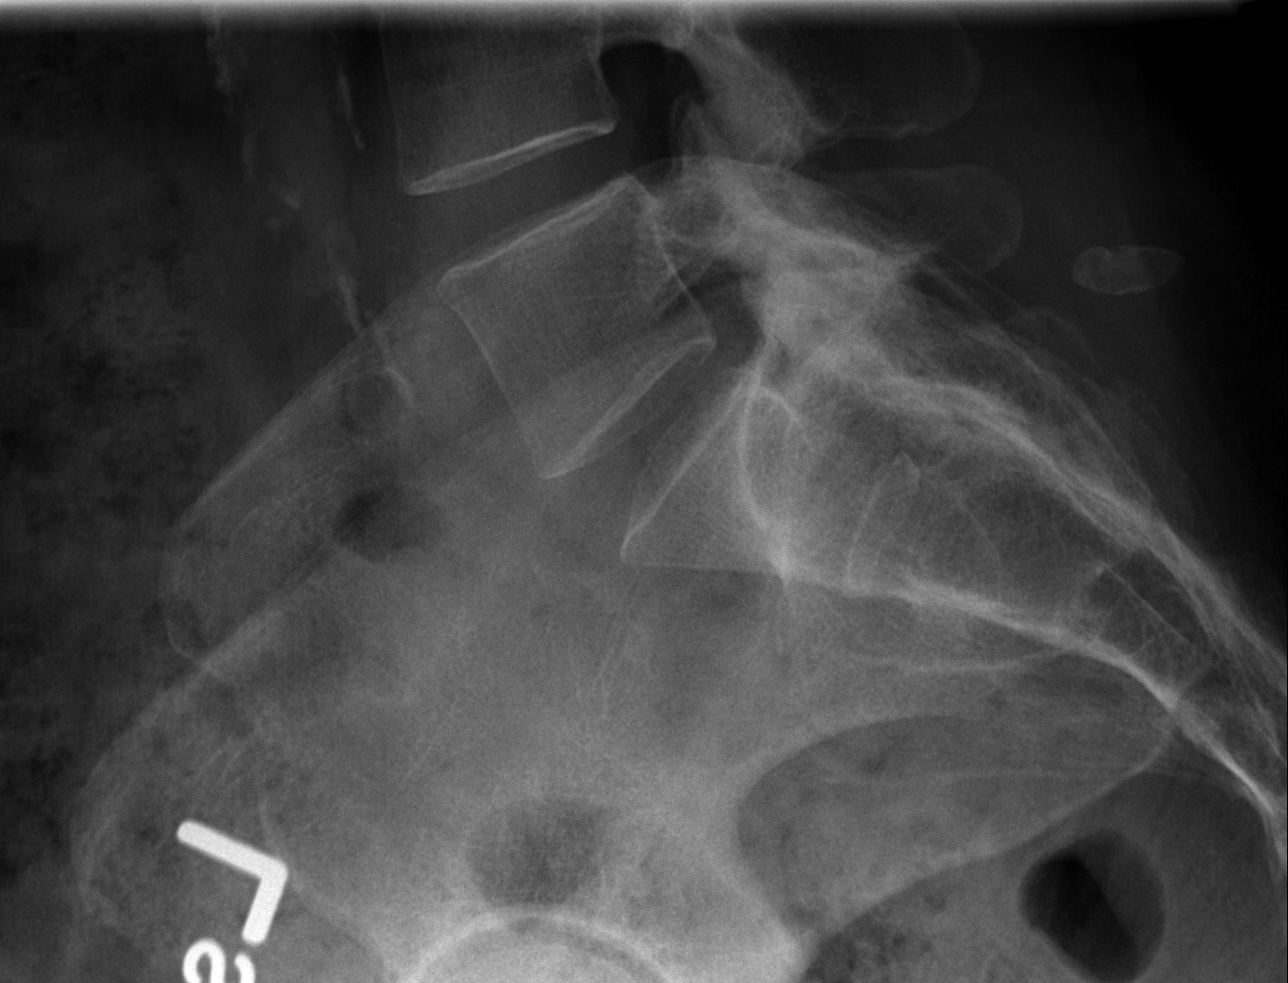

[5 of 5 positions shown; findings below may reference images not displayed]

FINDINGS: There are five lumbar type vertebral bodies.  The
alignment is normal.  The disc spaces are preserved within the
lumbar spine.  There is mild disc space loss with anterior
osteophytes at T12-L1, stable compared with prior CT.  There is no
evidence of acute fracture, pars defect or metastatic disease.
Facet degenerative changes are present inferiorly.  There is mild
aortic atherosclerosis. Injection granuloma is noted in the left
buttock.
IMPRESSION: No acute findings or malalignment.  No evidence of metastatic
disease.

## 2012-12-20 IMAGING — CR DG HIP (WITH OR WITHOUT PELVIS) 2-3V*L*
3 series · 3 of 3 positions shown · non-contrast
Comparison: PET CT 03/17/2010.

CLINICAL DATA: Left hip pain for 3 weeks.  History of lung cancer.

LEFT HIP - COMPLETE 2+ VIEW

[t pelvis a.p.]
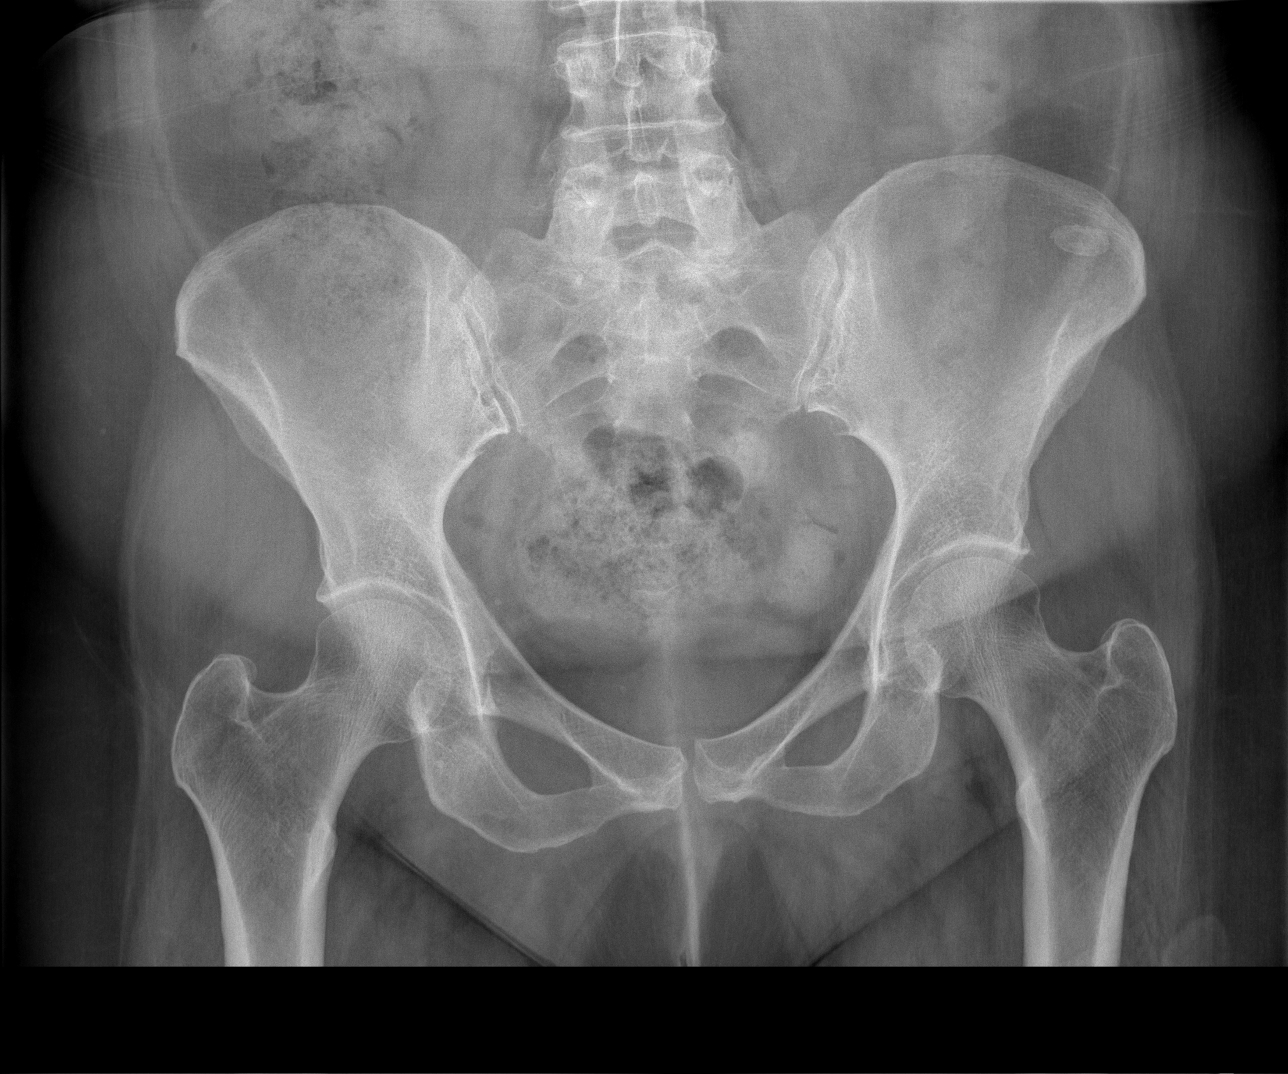

[t hip ap left]
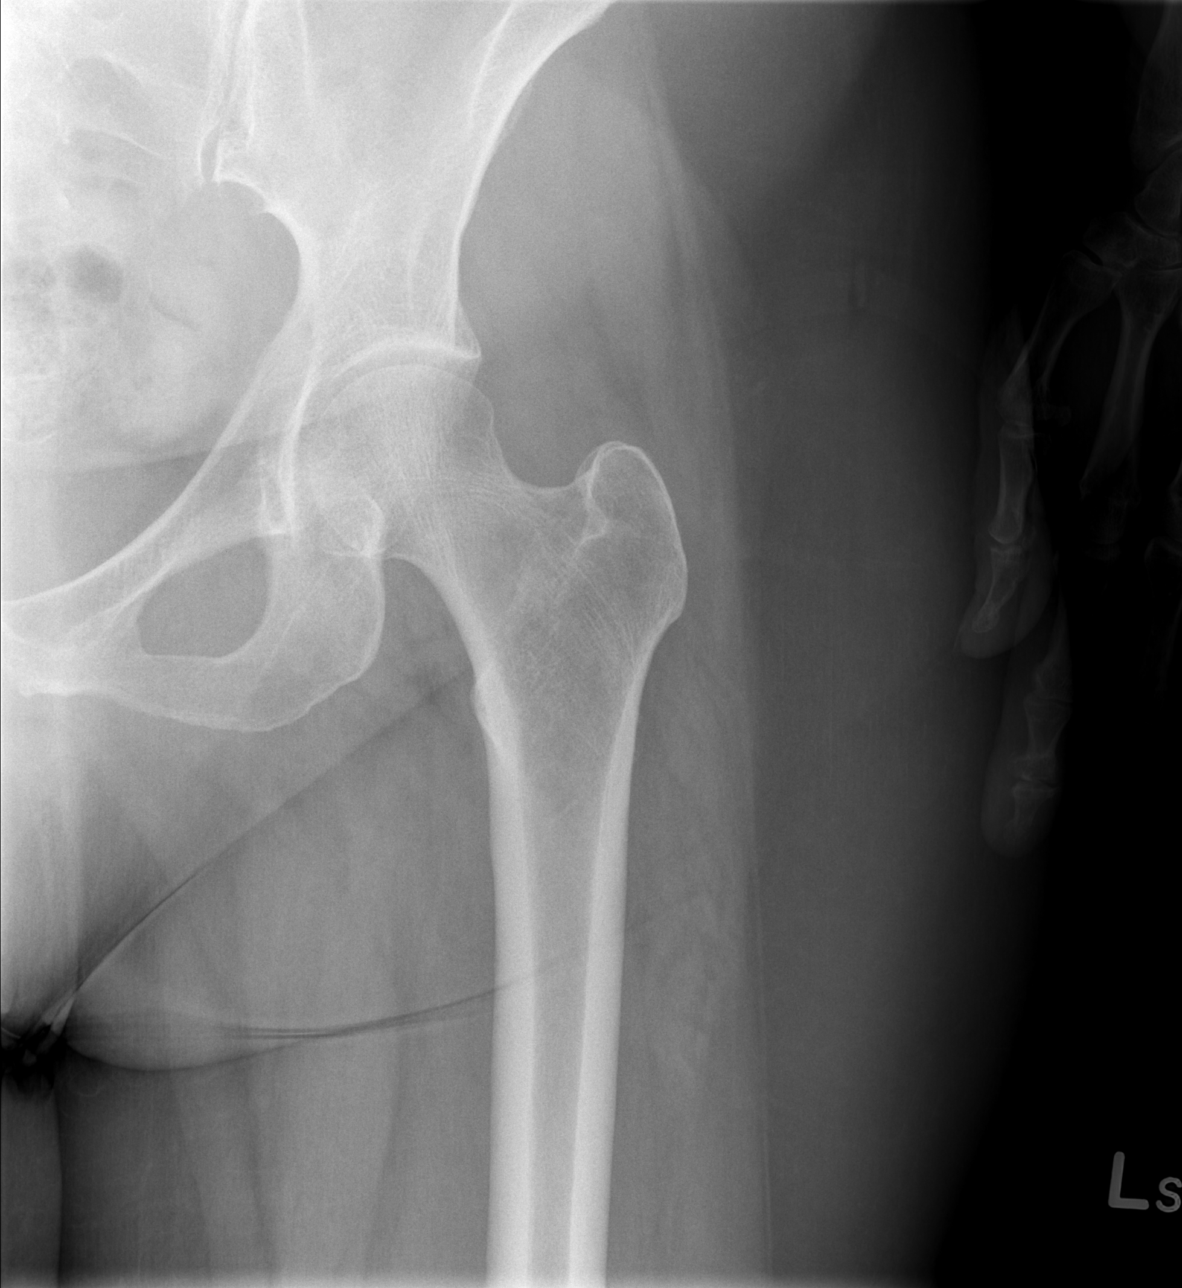

[t hip frog leg left]
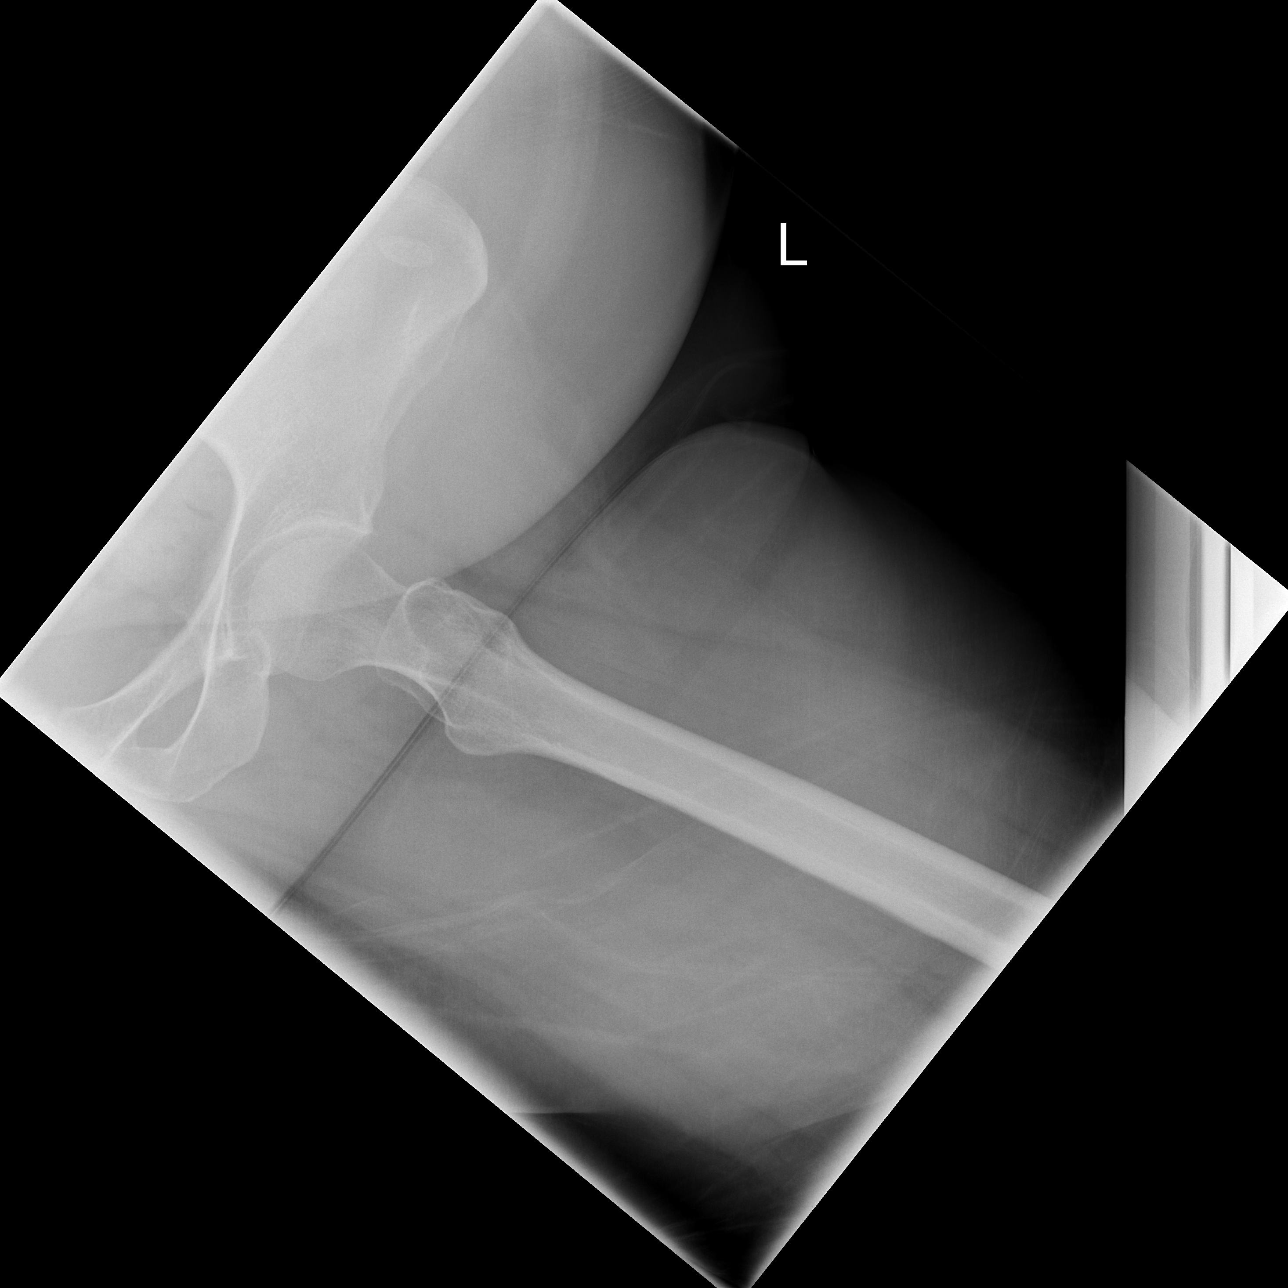

[3 of 3 positions shown; findings below may reference images not displayed]

FINDINGS: The mineralization and alignment are normal.  There is no
evidence of acute fracture or dislocation.  There is no evidence of
metastatic disease or osteonecrosis.  Hip joint spaces are
preserved.  Mild degenerative changes of the sacroiliac joints.
Injection granuloma overlies the left iliac wing, within the left
buttock on prior CT.
IMPRESSION: No acute findings.  No evidence of metastatic disease or
significant arthropathy for age.

## 2013-01-11 ENCOUNTER — Other Ambulatory Visit: Payer: Medicare Other | Admitting: Lab

## 2013-01-11 ENCOUNTER — Ambulatory Visit (HOSPITAL_BASED_OUTPATIENT_CLINIC_OR_DEPARTMENT_OTHER): Payer: 59

## 2013-01-11 ENCOUNTER — Ambulatory Visit (HOSPITAL_BASED_OUTPATIENT_CLINIC_OR_DEPARTMENT_OTHER): Payer: 59 | Admitting: Hematology & Oncology

## 2013-01-11 ENCOUNTER — Telehealth: Payer: Self-pay | Admitting: Hematology & Oncology

## 2013-01-11 ENCOUNTER — Other Ambulatory Visit: Payer: Self-pay | Admitting: Hematology & Oncology

## 2013-01-11 DIAGNOSIS — Z85118 Personal history of other malignant neoplasm of bronchus and lung: Secondary | ICD-10-CM

## 2013-01-11 DIAGNOSIS — R042 Hemoptysis: Secondary | ICD-10-CM

## 2013-01-11 LAB — COMPREHENSIVE METABOLIC PANEL
ALT: 12 U/L (ref 0–35)
Albumin: 4.1 g/dL (ref 3.5–5.2)
CO2: 28 mEq/L (ref 19–32)
Calcium: 9.4 mg/dL (ref 8.4–10.5)
Chloride: 102 mEq/L (ref 96–112)
Glucose, Bld: 75 mg/dL (ref 70–99)
Potassium: 4 mEq/L (ref 3.5–5.3)
Sodium: 139 mEq/L (ref 135–145)
Total Bilirubin: 0.5 mg/dL (ref 0.3–1.2)
Total Protein: 7 g/dL (ref 6.0–8.3)

## 2013-01-11 LAB — CBC WITH DIFFERENTIAL (CANCER CENTER ONLY)
BASO%: 1.1 % (ref 0.0–2.0)
Eosinophils Absolute: 0.2 10*3/uL (ref 0.0–0.5)
LYMPH#: 1.7 10*3/uL (ref 0.9–3.3)
MONO#: 0.5 10*3/uL (ref 0.1–0.9)
NEUT#: 4 10*3/uL (ref 1.5–6.5)
Platelets: 186 10*3/uL (ref 145–400)
RBC: 4.45 10*6/uL (ref 3.70–5.32)
RDW: 15.2 % (ref 11.1–15.7)
WBC: 6.4 10*3/uL (ref 3.9–10.0)

## 2013-01-11 LAB — LACTATE DEHYDROGENASE: LDH: 180 U/L (ref 94–250)

## 2013-01-11 MED ORDER — SODIUM CHLORIDE 0.9 % IJ SOLN
10.0000 mL | Freq: Once | INTRAMUSCULAR | Status: AC
  Administered 2013-01-11: 10 mL via INTRAVENOUS
  Filled 2013-01-11: qty 10

## 2013-01-11 MED ORDER — HEPARIN SOD (PORK) LOCK FLUSH 100 UNIT/ML IV SOLN
500.0000 [IU] | Freq: Once | INTRAVENOUS | Status: AC
  Administered 2013-01-11: 500 [IU] via INTRAVENOUS
  Filled 2013-01-11: qty 5

## 2013-01-11 NOTE — Telephone Encounter (Signed)
Pt aware of 12-29 CT at [address] [facility] and 1-16 MD appointments. Per request mailed schedule

## 2013-01-11 NOTE — Progress Notes (Signed)
[name] presented for Portacath access and flush. Proper placement of portacath confirmed by CXR. Portacath located in the right chest wall accessed with  H 20 needle. Clean, Dry and Intact Good blood return present. Portacath flushed with 20ml NS and 500U/39ml Heparin per protocol and needle removed intact. Procedure without incident. Patient tolerated procedure well.

## 2013-01-11 NOTE — Progress Notes (Deleted)
[name] presented for Portacath access and flush. Proper placement of portacath confirmed by CXR. Portacath located in the {RIGHT/LEFT:20294} chest wall accessed with  {CHL ONC AP PORTACATH NEEDLE:115400403} needle. {CVC SITE ASSESSMENT:115400500} {CHL ONC AP PORTACATH BLOOD AVWUJW:119147829} Portacath flushed with 20ml NS and 500U/42ml Heparin per protocol and needle removed intact. Procedure without incident. Patient tolerated procedure well.

## 2013-01-11 NOTE — Progress Notes (Signed)
This office note has been dictated.
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
## 2013-02-04 ENCOUNTER — Ambulatory Visit
Admission: RE | Admit: 2013-02-04 | Discharge: 2013-02-04 | Disposition: A | Discharge status: Home / Self Care | Payer: Medicare Other | Source: Ambulatory Visit | Attending: Hematology & Oncology | Admitting: Hematology & Oncology

## 2013-02-04 ENCOUNTER — Telehealth: Payer: Self-pay | Admitting: Oncology

## 2013-02-04 MED ORDER — IOHEXOL 300 MG/ML  SOLN
75.0000 mL | Freq: Once | INTRAMUSCULAR | Status: AC | PRN
  Administered 2013-02-04: 75 mL via INTRAVENOUS

## 2013-02-04 NOTE — Telephone Encounter (Addendum)
Message copied by Lacie Draft on Mon Feb 04, 2013  4:01 PM ------      Message from: Arlan Organ R      Created: Mon Feb 04, 2013  2:26 PM       Call - ct scan shows no recurrent cancer!!  Happy New Year!!  [employer] with pt and gave her the good news. ------

## 2013-02-14 ENCOUNTER — Telehealth: Payer: Self-pay | Admitting: *Deleted

## 2013-02-14 NOTE — Telephone Encounter (Signed)
Pt called and asked that her recent labs be faxed to her primary care MD.  Done.

## 2013-02-22 ENCOUNTER — Other Ambulatory Visit (HOSPITAL_BASED_OUTPATIENT_CLINIC_OR_DEPARTMENT_OTHER): Payer: 59 | Admitting: Lab

## 2013-02-22 ENCOUNTER — Encounter: Payer: Self-pay | Admitting: Hematology & Oncology

## 2013-02-22 ENCOUNTER — Ambulatory Visit (HOSPITAL_BASED_OUTPATIENT_CLINIC_OR_DEPARTMENT_OTHER): Payer: 59 | Admitting: Hematology & Oncology

## 2013-02-22 ENCOUNTER — Ambulatory Visit (HOSPITAL_BASED_OUTPATIENT_CLINIC_OR_DEPARTMENT_OTHER): Payer: 59

## 2013-02-22 VITALS — BP 121/77 | HR 72 | Temp 97.8°F | Resp 14 | Ht 62.0 in | Wt 165.0 lb

## 2013-02-22 DIAGNOSIS — C3490 Malignant neoplasm of unspecified part of unspecified bronchus or lung: Secondary | ICD-10-CM

## 2013-02-22 DIAGNOSIS — Z85038 Personal history of other malignant neoplasm of large intestine: Secondary | ICD-10-CM

## 2013-02-22 LAB — CMP (CANCER CENTER ONLY)
ALBUMIN: 3.9 g/dL (ref 3.3–5.5)
ALK PHOS: 77 U/L (ref 26–84)
ALT(SGPT): 20 U/L (ref 10–47)
AST: 20 U/L (ref 11–38)
BUN: 11 mg/dL (ref 7–22)
CALCIUM: 9.5 mg/dL (ref 8.0–10.3)
CO2: 32 meq/L (ref 18–33)
Chloride: 104 mEq/L (ref 98–108)
Creat: 0.5 mg/dl — ABNORMAL LOW (ref 0.6–1.2)
GLUCOSE: 95 mg/dL (ref 73–118)
POTASSIUM: 4.1 meq/L (ref 3.3–4.7)
Sodium: 143 mEq/L (ref 128–145)
TOTAL PROTEIN: 7.7 g/dL (ref 6.4–8.1)
Total Bilirubin: 0.5 mg/dl (ref 0.20–1.60)

## 2013-02-22 LAB — CBC WITH DIFFERENTIAL (CANCER CENTER ONLY)
BASO#: 0.1 10*3/uL (ref 0.0–0.2)
BASO%: 1 % (ref 0.0–2.0)
EOS ABS: 0.2 10*3/uL (ref 0.0–0.5)
EOS%: 3.3 % (ref 0.0–7.0)
HCT: 39.4 % (ref 34.8–46.6)
HEMOGLOBIN: 12.8 g/dL (ref 11.6–15.9)
LYMPH#: 1.6 10*3/uL (ref 0.9–3.3)
LYMPH%: 26.5 % (ref 14.0–48.0)
MCH: 27.5 pg (ref 26.0–34.0)
MCHC: 32.5 g/dL (ref 32.0–36.0)
MCV: 85 fL (ref 81–101)
MONO#: 0.4 10*3/uL (ref 0.1–0.9)
MONO%: 6.5 % (ref 0.0–13.0)
NEUT%: 62.7 % (ref 39.6–80.0)
NEUTROS ABS: 3.8 10*3/uL (ref 1.5–6.5)
Platelets: 215 10*3/uL (ref 145–400)
RBC: 4.66 10*6/uL (ref 3.70–5.32)
RDW: 15.1 % (ref 11.1–15.7)
WBC: 6.1 10*3/uL (ref 3.9–10.0)

## 2013-02-22 MED ORDER — SODIUM CHLORIDE 0.9 % IJ SOLN
10.0000 mL | INTRAMUSCULAR | Status: DC | PRN
  Administered 2013-02-22: 10 mL via INTRAVENOUS
  Filled 2013-02-22: qty 10

## 2013-02-22 MED ORDER — HEPARIN SOD (PORK) LOCK FLUSH 100 UNIT/ML IV SOLN
500.0000 [IU] | Freq: Once | INTRAVENOUS | Status: AC | PRN
  Administered 2013-02-22: 500 [IU] via INTRAVENOUS
  Filled 2013-02-22: qty 5

## 2013-02-22 NOTE — Patient Instructions (Signed)
Implanted Port Insertion, Care After Refer to this sheet in the next few weeks. These instructions provide you with information on caring for yourself after your procedure. Your health care provider may also give you more specific instructions. Your treatment has been planned according to current medical practices, but problems sometimes occur. Call your health care provider if you have any problems or questions after your procedure. WHAT TO EXPECT AFTER THE PROCEDURE After your procedure, it is typical to have the following:   Discomfort at the port insertion site. Ice packs to the area will help.  Bruising on the skin over the port. This will subside in 3 4 days. HOME CARE INSTRUCTIONS  After your port is placed, you will get a manufacturer's information card. The card has information about your port. Keep this card with you at all times.   Know what kind of port you have. There are many types of ports available.   Wear a medical alert bracelet in case of an emergency. This can help alert health care workers that you have a port.   The port can stay in for as long as your health care provider believes it is necessary.   A home health care nurse may give medicines and take care of the port.   You or a family member can get special training and directions for giving medicine and taking care of the port at home.  SEEK MEDICAL CARE IF:  Your port does not flush or you are unable to get a blood return.   SEEK IMMEDIATE MEDICAL CARE IF:  You have new fluid or pus coming from your incision.   You notice a bad smell coming from your incision site.   You have swelling, pain, or more redness at the incision or port site.   You have a fever or chills.   You have chest pain or shortness of breath. Document Released: 11/14/2012 Document Reviewed: 10/01/2012 [facility] Patient Information 2014 [place], [place].

## 2013-02-22 NOTE — Progress Notes (Signed)
This office note has been dictated.

## 2013-02-23 NOTE — Progress Notes (Signed)
CC:   Kara Litten, MD Tanvir A. Chodri, M.D. Ward Chatters, MD  DIAGNOSES: 1. Locally recurrent adenocarcinoma of the lung -- clinical remission. 2. Post thoracotomy chest wall syndrome on the right. 3. Hiatal hernia.  CURRENT THERAPY:  Observation.  INTERIM HISTORY:  [name] comes in for a followup.  She is doing quite well.  She has had no problems with respect to her lung cancer. She does have a post thoracotomy syndrome on the right.  This seems to be under fairly good control right now.  We did go ahead and repeat a CT scan of her chest.  This was done on December 29th, did not show any evidence of recurrent disease.  She had no lymphadenopathy.  She did have the hiatal hernia that was noted.  No bony lesions were noted.  She is worried about her hiatal hernia.  She probably will see a gastroenterologist down in [place].  I think she has already had some surgery for this.  She is also worried about her weight.  She wants to try to lose weight. I told her about a new FDA approved device that might be able to help her out.  I am not sure if she will qualify for this right now, however.  PHYSICAL EXAMINATION:  General:  This is a well-developed, well- nourished white female in no obvious distress.  Vital Signs: Temperature of 97.8, pulse 72, respiratory rate 14, blood pressure 121/77, weight is 165 pounds.  Head and Neck:  Normocephalic, atraumatic skull.  There are no ocular or oral lesions.  There are no palpable cervical or supraclavicular lymph nodes.  Lungs:  Clear bilaterally. Cardiac:  Regular rate and rhythm with a normal S1, S2.  There are no murmurs, rubs, or bruits.  Abdomen:  Soft.  She has good bowel sounds. There is no palpable abdominal mass.  There is no fluid wave.  There is no palpable hepatosplenomegaly.  There may be some slight tenderness in the right upper quadrant to palpation.  Back:  Some tenderness over the right lateral chest wall.  This is  chronic.  This is stable. Extremities:  Show no clubbing, cyanosis, or edema.  Neurological: Shows no focal neurological deficits.  LABORATORY STUDIES:  White cell count is 6.1, hemoglobin 12.8, hematocrit 39.4, platelet count 215.  IMPRESSION:  [name] is a very charming 63 year old white female. She has history of locally recurrent adenocarcinoma of the lung.  She has had a couple of recurrences.  However, her last recurrence was almost 3 years ago in April 2012.  She has done incredibly well.  We still have to be careful with respect to recurrences in the future.  I do not see that we need to do any scans on her probably for another 4 to 6 months.  She has a Port-A-Cath in.  We will continue to flush the Port-A-Cath every couple months.  I will plan to see her back in 2 months.    ______________________________ Volanda Napoleon, M.D. PRE/MEDQ  D:  02/22/2013  T:  02/23/2013  Job:  [id number]

## 2013-02-26 NOTE — Telephone Encounter (Signed)
Opened in error

## 2013-03-04 IMAGING — CT CT CHEST W/ CM
3 of 4 series · 16 of 30 positions shown, 17 images · IV contrast (omnipaque)
Comparison: Chest CT 01/26/2011 and 07/12/2010.

CLINICAL DATA: History of lung cancer.

CT CHEST WITH CONTRAST
TECHNIQUE: Multidetector CT imaging of the chest was performed
following the standard protocol during bolus administration of
intravenous contrast.
Contrast: 75mL OMNIPAQUE IOHEXOL 300 MG/ML IJ SOLN

[Series 3: chest with · axial · 0.70mm/px · z∈[-193,-28]mm · 4 of 57 slices shown, 5 images]
[im 12/57  mediastinal]
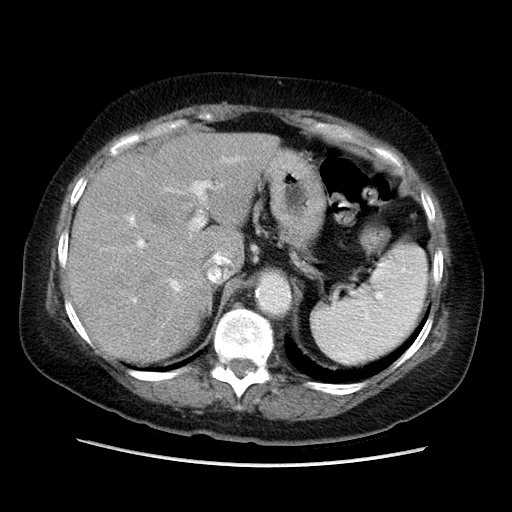
[im 12/57  lung]
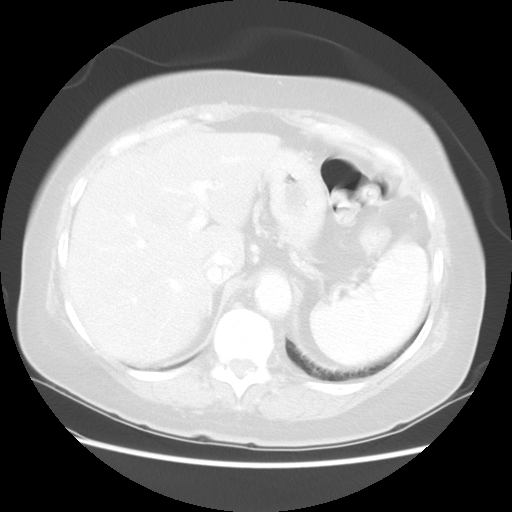
[im 23/57  lung]
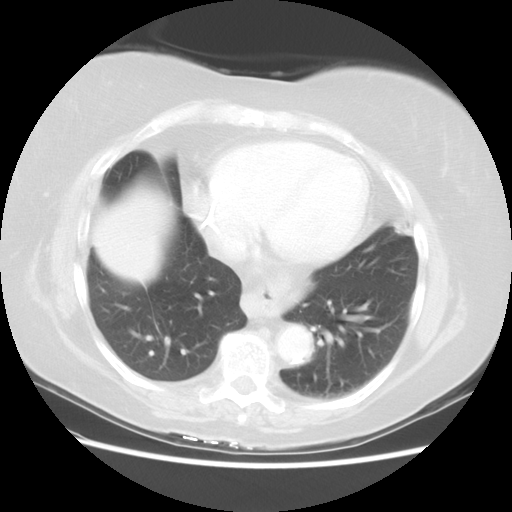
[im 34/57  lung]
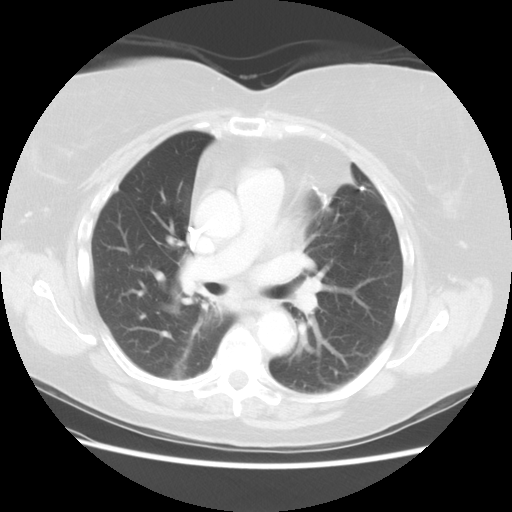
[im 45/57  lung]
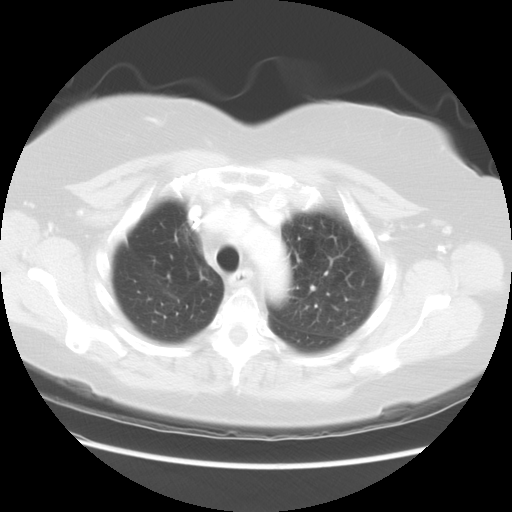

[Series 4: lung windows · axial · 0.70mm/px · z∈[-178,-23]mm · 4 of 53 slices shown]
[im 11/53  lung]
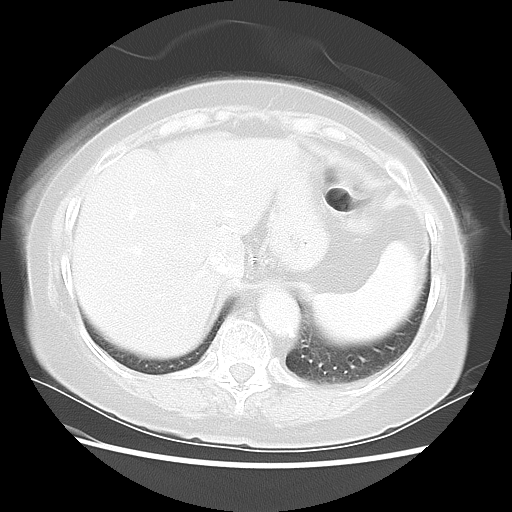
[im 21/53  lung]
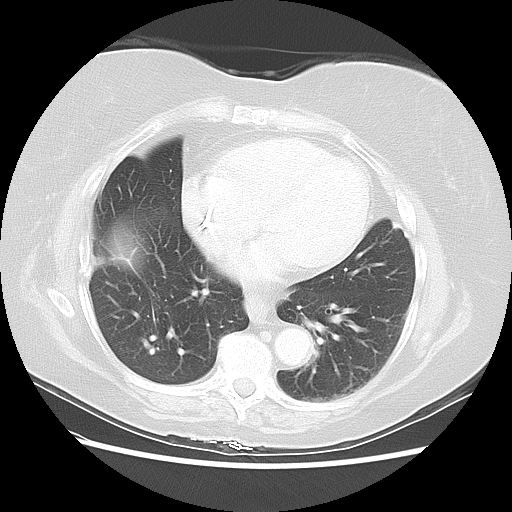
[im 32/53  lung]
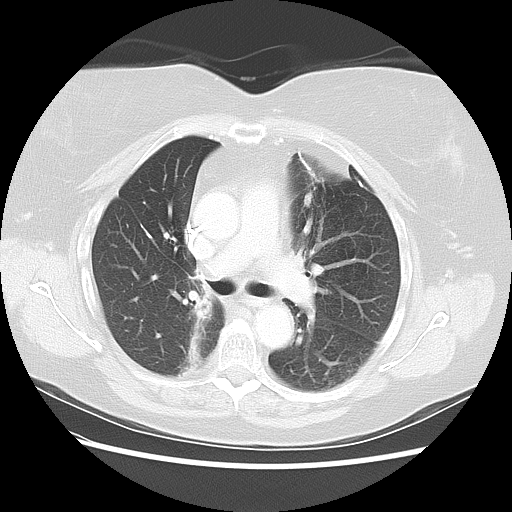
[im 42/53  lung]
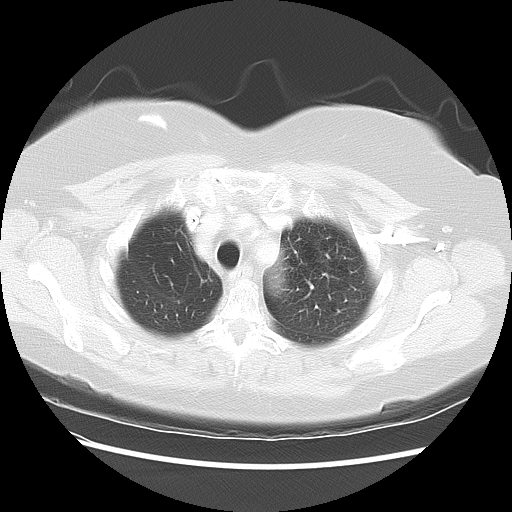

[Series 602: sagittal body · sagittal · 0.70mm/px · 8 of 145 slices shown]
[im 10/145  mediastinal]
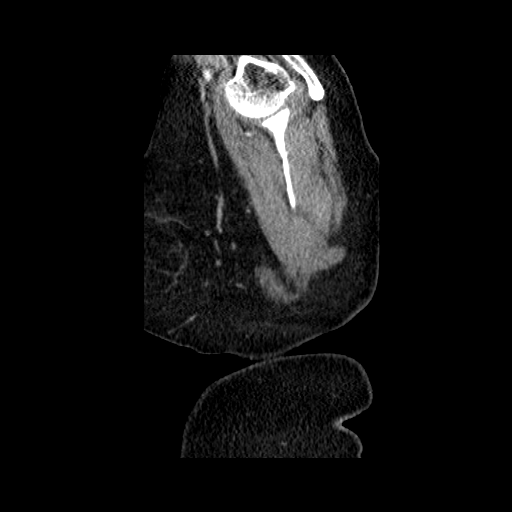
[im 29/145  mediastinal]
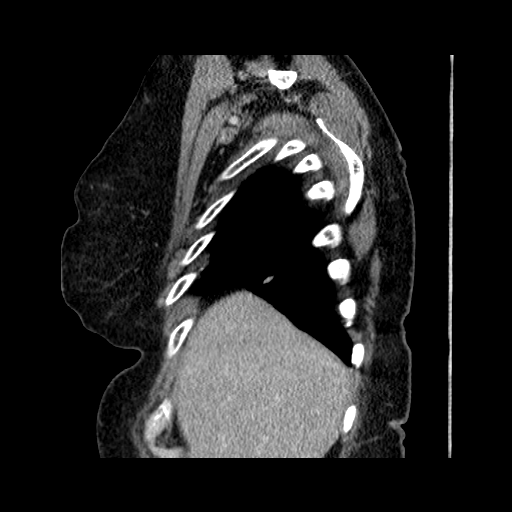
[im 49/145  mediastinal]
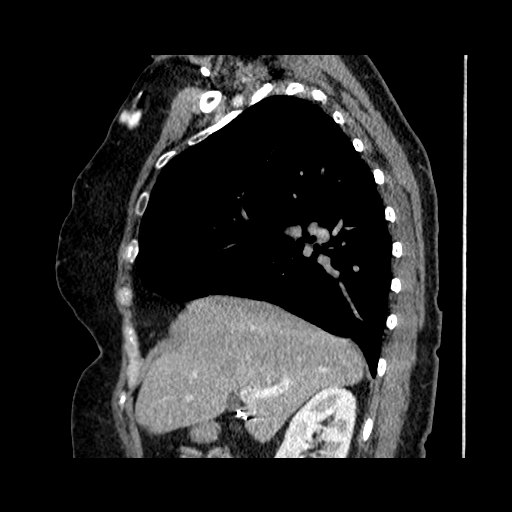
[im 68/145  mediastinal]
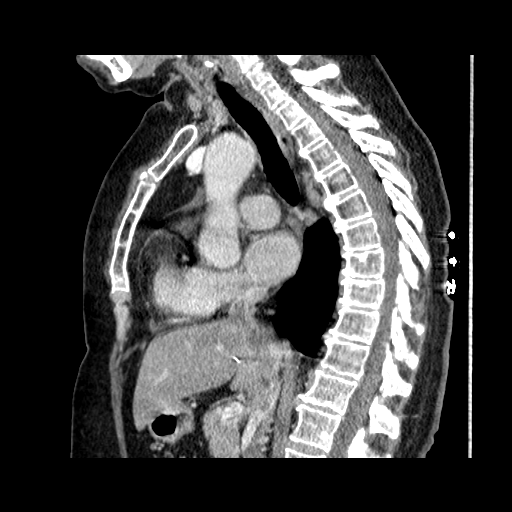
[im 77/145  mediastinal]
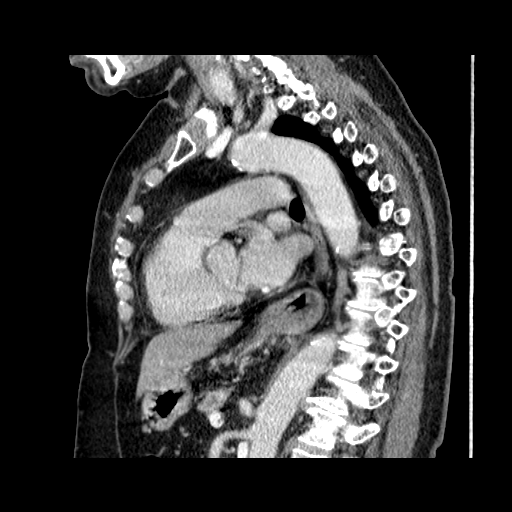
[im 97/145  mediastinal]
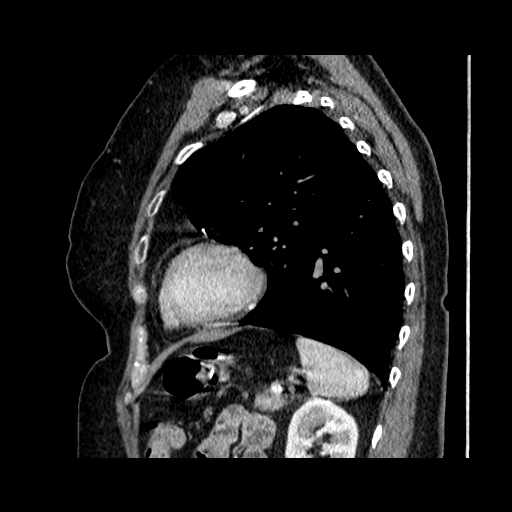
[im 116/145  mediastinal]
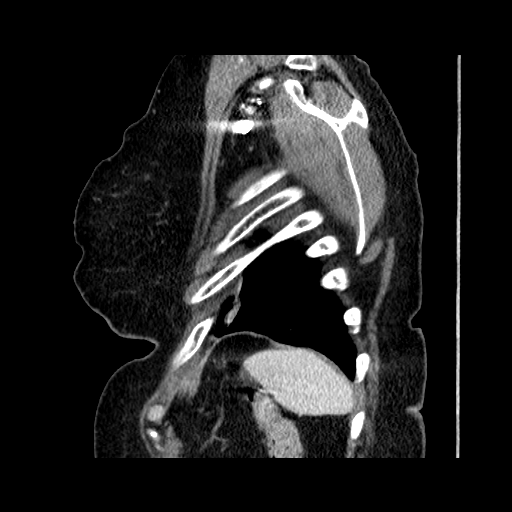
[im 135/145  mediastinal]
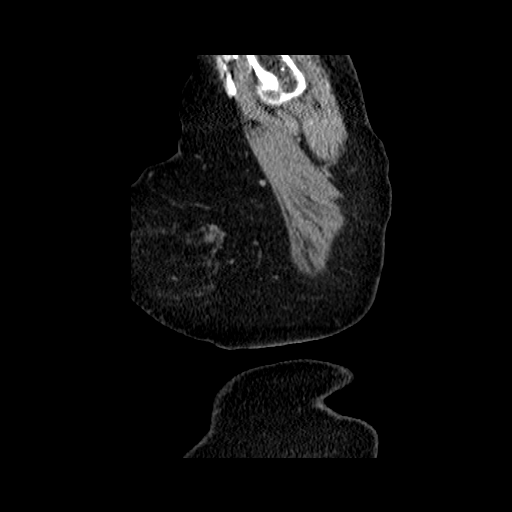

[16 of 30 positions shown; findings below may reference images not displayed]

FINDINGS: The chest wall is unremarkable and stable.  A right-sided
Port-A-Cath is noted.  No breast masses, supraclavicular or
axillary lymphadenopathy.  The thyroid gland is normal.  The bony
thorax is intact.  No destructive bone lesions or spinal canal
compromise.  Stable degenerative changes involving the thoracic
spine.

The heart is normal in size.  No pericardial effusion.  No
mediastinal or hilar lymphadenopathy.  A small scattered lymph
nodes are stable.  The esophagus is unremarkable except for a a
moderate sized hiatal hernia.  The aorta is normal in caliber.  No
dissection.  Minimal atherosclerotic calcifications at the arch.

Examination of the lung parenchyma demonstrates stable underlying
emphysematous changes.  There are also stable surgical and
radiation changes.  The right lower lobe mass-like lesion appears
stable looking back and multiple chest CTs measures a maximum of
3.5 cm.

Near the anterior staple line in the left upper lobe there is an
enlarging nodular density which measures 10 x 5 mm.  This measured
7 x 3 mm on the prior study.  This is worrisome for a local
recurrence.

The nodular density at the left lung base in the lingula is
unchanged.  No pleural effusion.

The upper abdomen is unremarkable and stable.  No adrenal gland
lesions or liver lesions.  Fatty infiltration of the liver is again
noted.
IMPRESSION: 1.  Stable surgical and radiation changes involving both lungs.
The mass-like density in the right lower lobe is stable.
2.  Enlarging nodule in the left upper lobe near the staple line
worrisome for tumor recurrence.
3.  No mediastinal or hilar lymphadenopathy.
4.  Stable moderate sized hiatal hernia.

## 2013-04-18 ENCOUNTER — Telehealth: Payer: Self-pay | Admitting: Hematology & Oncology

## 2013-04-18 ENCOUNTER — Other Ambulatory Visit (HOSPITAL_BASED_OUTPATIENT_CLINIC_OR_DEPARTMENT_OTHER): Payer: Medicare Other | Admitting: Lab

## 2013-04-18 ENCOUNTER — Ambulatory Visit (HOSPITAL_BASED_OUTPATIENT_CLINIC_OR_DEPARTMENT_OTHER): Payer: 59

## 2013-04-18 ENCOUNTER — Ambulatory Visit (HOSPITAL_BASED_OUTPATIENT_CLINIC_OR_DEPARTMENT_OTHER): Payer: 59 | Admitting: Hematology & Oncology

## 2013-04-18 ENCOUNTER — Encounter: Payer: Self-pay | Admitting: Hematology & Oncology

## 2013-04-18 VITALS — BP 116/81 | HR 75 | Temp 98.1°F | Resp 14 | Ht 62.0 in | Wt 167.0 lb

## 2013-04-18 DIAGNOSIS — Z452 Encounter for adjustment and management of vascular access device: Secondary | ICD-10-CM

## 2013-04-18 DIAGNOSIS — C3490 Malignant neoplasm of unspecified part of unspecified bronchus or lung: Secondary | ICD-10-CM

## 2013-04-18 DIAGNOSIS — M25579 Pain in unspecified ankle and joints of unspecified foot: Secondary | ICD-10-CM

## 2013-04-18 DIAGNOSIS — Z85118 Personal history of other malignant neoplasm of bronchus and lung: Secondary | ICD-10-CM

## 2013-04-18 LAB — CMP (CANCER CENTER ONLY)
ALBUMIN: 3.7 g/dL (ref 3.3–5.5)
ALK PHOS: 68 U/L (ref 26–84)
ALT(SGPT): 16 U/L (ref 10–47)
AST: 18 U/L (ref 11–38)
BUN: 12 mg/dL (ref 7–22)
CALCIUM: 8.9 mg/dL (ref 8.0–10.3)
CHLORIDE: 103 meq/L (ref 98–108)
CO2: 30 meq/L (ref 18–33)
Creat: 0.6 mg/dl (ref 0.6–1.2)
GLUCOSE: 98 mg/dL (ref 73–118)
POTASSIUM: 3.2 meq/L — AB (ref 3.3–4.7)
SODIUM: 140 meq/L (ref 128–145)
TOTAL PROTEIN: 7.4 g/dL (ref 6.4–8.1)
Total Bilirubin: 0.6 mg/dl (ref 0.20–1.60)

## 2013-04-18 LAB — CBC WITH DIFFERENTIAL (CANCER CENTER ONLY)
BASO#: 0.1 10*3/uL (ref 0.0–0.2)
BASO%: 1 % (ref 0.0–2.0)
EOS ABS: 0.1 10*3/uL (ref 0.0–0.5)
EOS%: 2 % (ref 0.0–7.0)
HCT: 39.1 % (ref 34.8–46.6)
HEMOGLOBIN: 12.6 g/dL (ref 11.6–15.9)
LYMPH#: 1.7 10*3/uL (ref 0.9–3.3)
LYMPH%: 25 % (ref 14.0–48.0)
MCH: 27.6 pg (ref 26.0–34.0)
MCHC: 32.2 g/dL (ref 32.0–36.0)
MCV: 86 fL (ref 81–101)
MONO#: 0.3 10*3/uL (ref 0.1–0.9)
MONO%: 4.7 % (ref 0.0–13.0)
NEUT%: 67.3 % (ref 39.6–80.0)
NEUTROS ABS: 4.6 10*3/uL (ref 1.5–6.5)
PLATELETS: 209 10*3/uL (ref 145–400)
RBC: 4.56 10*6/uL (ref 3.70–5.32)
RDW: 15.4 % (ref 11.1–15.7)
WBC: 6.9 10*3/uL (ref 3.9–10.0)

## 2013-04-18 MED ORDER — SODIUM CHLORIDE 0.9 % IJ SOLN
10.0000 mL | INTRAMUSCULAR | Status: DC | PRN
  Administered 2013-04-18: 10 mL via INTRAVENOUS
  Filled 2013-04-18: qty 10

## 2013-04-18 MED ORDER — HEPARIN SOD (PORK) LOCK FLUSH 100 UNIT/ML IV SOLN
500.0000 [IU] | Freq: Once | INTRAVENOUS | Status: AC | PRN
  Administered 2013-04-18: 500 [IU] via INTRAVENOUS
  Filled 2013-04-18: qty 5

## 2013-04-18 MED ORDER — ALTEPLASE 2 MG IJ SOLR
2.0000 mg | Freq: Once | INTRAMUSCULAR | Status: DC | PRN
  Filled 2013-04-18: qty 2

## 2013-04-18 NOTE — Telephone Encounter (Signed)
Left pt message with 3-20 appointment at 815 pm. I also gave her their phone number so she could reschedule if needed

## 2013-04-18 NOTE — Patient Instructions (Signed)

## 2013-04-18 NOTE — Progress Notes (Signed)
  DIAGNOSIS:  Locally recurrent adenocarcinoma of the lung-clinical remission  Post thoracotomy chest wall syndrome on the right  Hiatal hernia-progressively symptomatic   CURRENT THERAPY:  Observation   INTERIM HISTORY: [name] comes in for a followup. Her problem now is that she has pain in her left foot. This is bottom of the left foot. Is under the first 3 toes. This has been going on for about one month. She has a little weakness in her foot. She's experiencing some back discomfort. Given that she has had recurrent lung cancer, I think we are going to have to check this out and do an MRI. I don't see any other way of evaluating this. She is at high risk for recurrent disease. She is a little overweight so this also could be a disc problem. I think an MRI will be best way to evaluate this.  Her hiatal hernia is bothering her quite a bit. She may need to have this evaluated and operated on.  Is no cough. She still has the right chest wall pain. This is chronic.  PHYSICAL EXAMINATION:  Somewhat obese white female in no obvious distress. Vital signs temperature 98.1. Blood pressure 160/81. Pulse 75. Weight is 167 pounds. Head exam shows no ocular or oral lesions. She is no palpable cervical or supraclavicular lymph nodes. Lungs are clear. Cardiac exam regular rate and rhythm with no murmurs rubs or bruits. Abdomen is soft. She is mildly obese. She tenderness in the epigastric and right upper quadrant which is chronic. Does note hepatomegaly. Spleen is not palpable. Back exam shows some spasms in the mid to lower back. Some tenderness is noted in the lumbar spine. Extremities shows no clubbing cyanosis or edema. She has good pulses in her distal extremities. She has some slight weakness with left foot dorsi flexion. Skin exam no rashes.   LABORATORY STUDIES:  White cell count 6.9. He will then go 0.6. Hematocrit 39.1. Platelet count 209. Her potassium is 3.2. Calcium is 8.9 with  albumin of 3.7.   IMPRESSION:  [name] is a nice 63 year old white female. She is a history of recurrent adenocarcinoma of the lung. Fortunately,, her recurrence has now been almost 3 years. This was back in April 2012. She got radiation with chemotherapy.    I do worry about this new symptom with the left foot. She is at risk for recurrence and metastatic disease. As such, we need to be aggressive and evaluate this with an MRI. I talked her about this.    We will try to get the MRI set up for next week.    If all looks good, there will plan to get her back in another 3 months or so.    She gets her Port-A-Cath flushed every 6 weeks.    I did spend about 30 minutes with her today trying to figure out is new neurologic symptom.   Volanda Napoleon, MD 04/18/2013

## 2013-04-26 ENCOUNTER — Ambulatory Visit
Admission: RE | Admit: 2013-04-26 | Discharge: 2013-04-26 | Disposition: A | Discharge status: Home / Self Care | Payer: Managed Care, Other (non HMO) | Source: Ambulatory Visit | Attending: Hematology & Oncology | Admitting: Hematology & Oncology

## 2013-04-26 DIAGNOSIS — C3490 Malignant neoplasm of unspecified part of unspecified bronchus or lung: Secondary | ICD-10-CM

## 2013-04-26 MED ORDER — GADOBENATE DIMEGLUMINE 529 MG/ML IV SOLN
16.0000 mL | Freq: Once | INTRAVENOUS | Status: AC | PRN
  Administered 2013-04-26: 16 mL via INTRAVENOUS

## 2013-04-30 ENCOUNTER — Telehealth: Payer: Self-pay | Admitting: *Deleted

## 2013-04-30 NOTE — Telephone Encounter (Addendum)
Message copied by Lenn Sink on Tue Apr 30, 2013  3:08 PM ------      Message from: Volanda Napoleon      Created: Sun Apr 28, 2013  8:03 PM       Call - MRI looks ok!!  No obvious cancer!!  Laurey Arrow ------Informed patient that MRI looks okay.

## 2013-05-30 ENCOUNTER — Ambulatory Visit (HOSPITAL_BASED_OUTPATIENT_CLINIC_OR_DEPARTMENT_OTHER): Payer: Medicare Other

## 2013-05-30 VITALS — BP 105/75 | HR 94 | Temp 99.0°F | Resp 16

## 2013-05-30 DIAGNOSIS — Z85118 Personal history of other malignant neoplasm of bronchus and lung: Secondary | ICD-10-CM

## 2013-05-30 DIAGNOSIS — Z452 Encounter for adjustment and management of vascular access device: Secondary | ICD-10-CM

## 2013-05-30 DIAGNOSIS — C3490 Malignant neoplasm of unspecified part of unspecified bronchus or lung: Secondary | ICD-10-CM

## 2013-05-30 MED ORDER — HEPARIN SOD (PORK) LOCK FLUSH 100 UNIT/ML IV SOLN
500.0000 [IU] | Freq: Once | INTRAVENOUS | Status: AC | PRN
  Administered 2013-05-30: 500 [IU] via INTRAVENOUS
  Filled 2013-05-30: qty 5

## 2013-05-30 MED ORDER — ALTEPLASE 2 MG IJ SOLR
2.0000 mg | Freq: Once | INTRAMUSCULAR | Status: DC | PRN
  Filled 2013-05-30: qty 2

## 2013-05-30 MED ORDER — SODIUM CHLORIDE 0.9 % IJ SOLN
10.0000 mL | INTRAMUSCULAR | Status: DC | PRN
  Administered 2013-05-30: 10 mL via INTRAVENOUS
  Filled 2013-05-30: qty 10

## 2013-07-16 IMAGING — CR DG HIP (WITH OR WITHOUT PELVIS) 2-3V*L*
3 series · 3 of 3 positions shown · non-contrast
Comparison: 02/05/2011

CLINICAL DATA: Left hip pain

LEFT HIP - COMPLETE 2+ VIEW

[t pelvis a.p.]
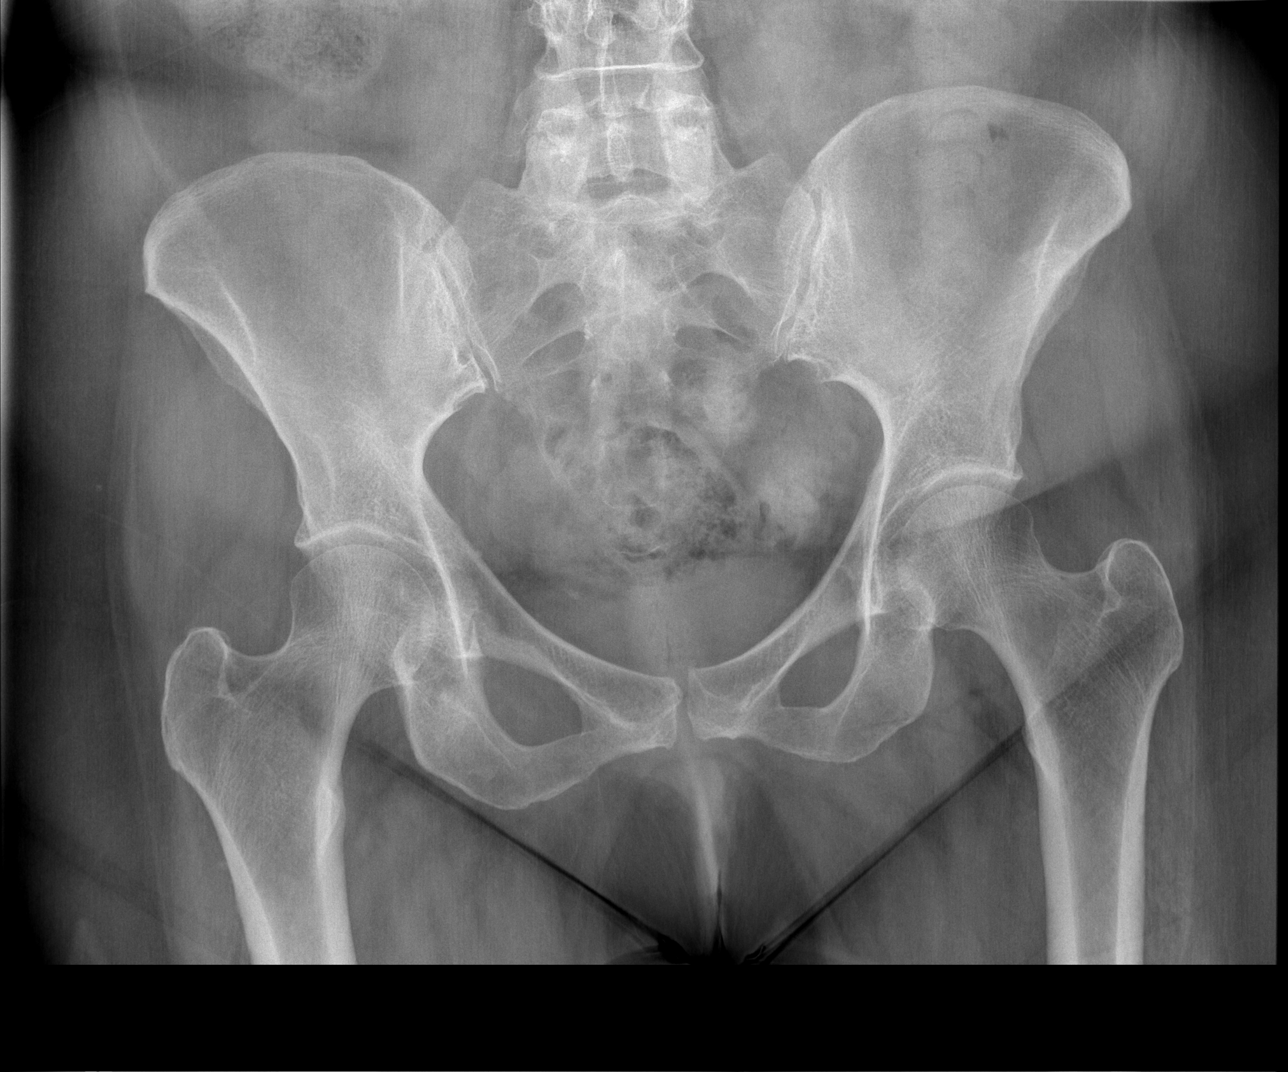

[t hip ap left]
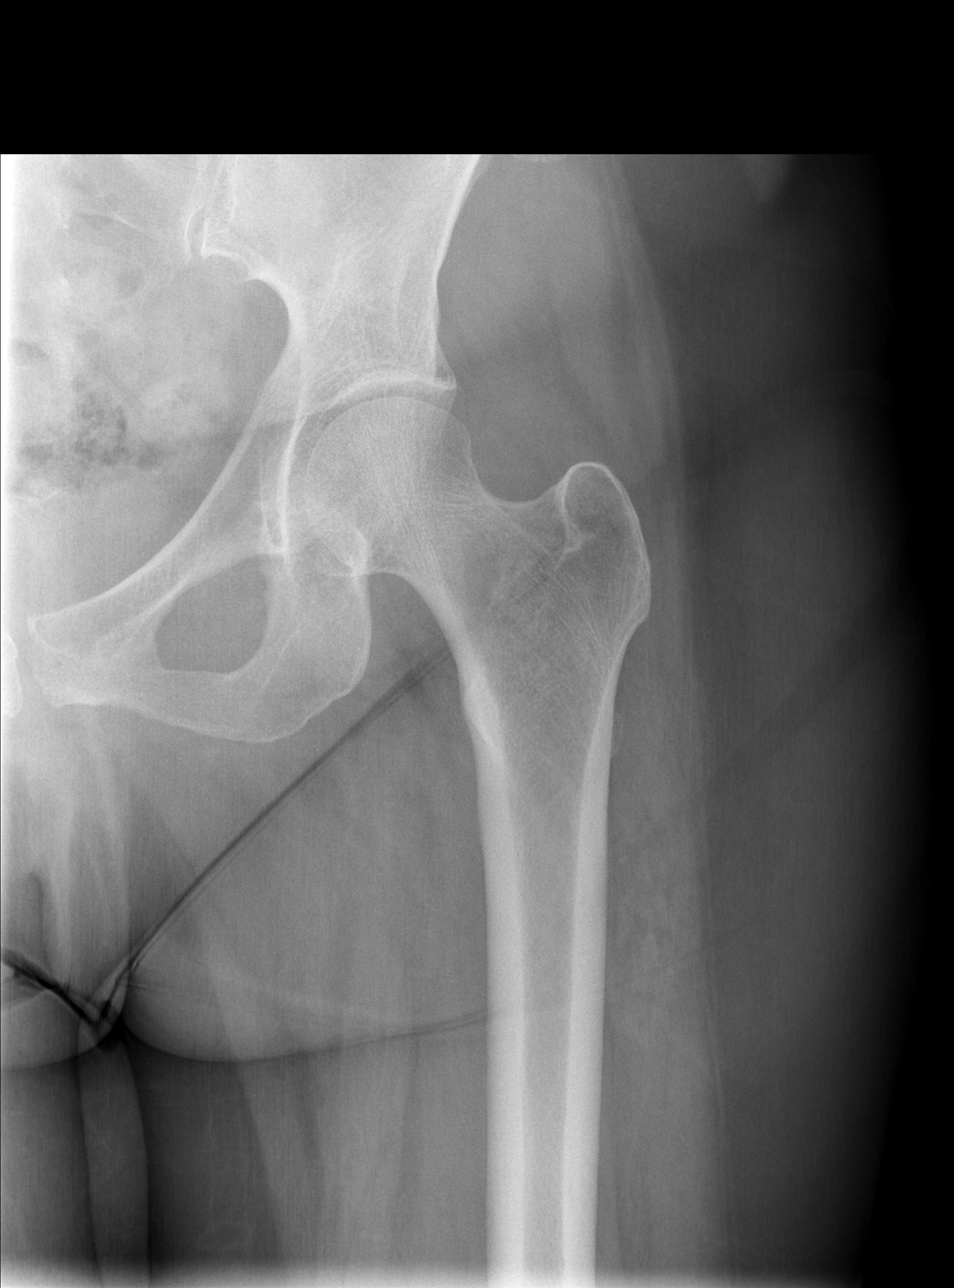

[t hip frog leg left]
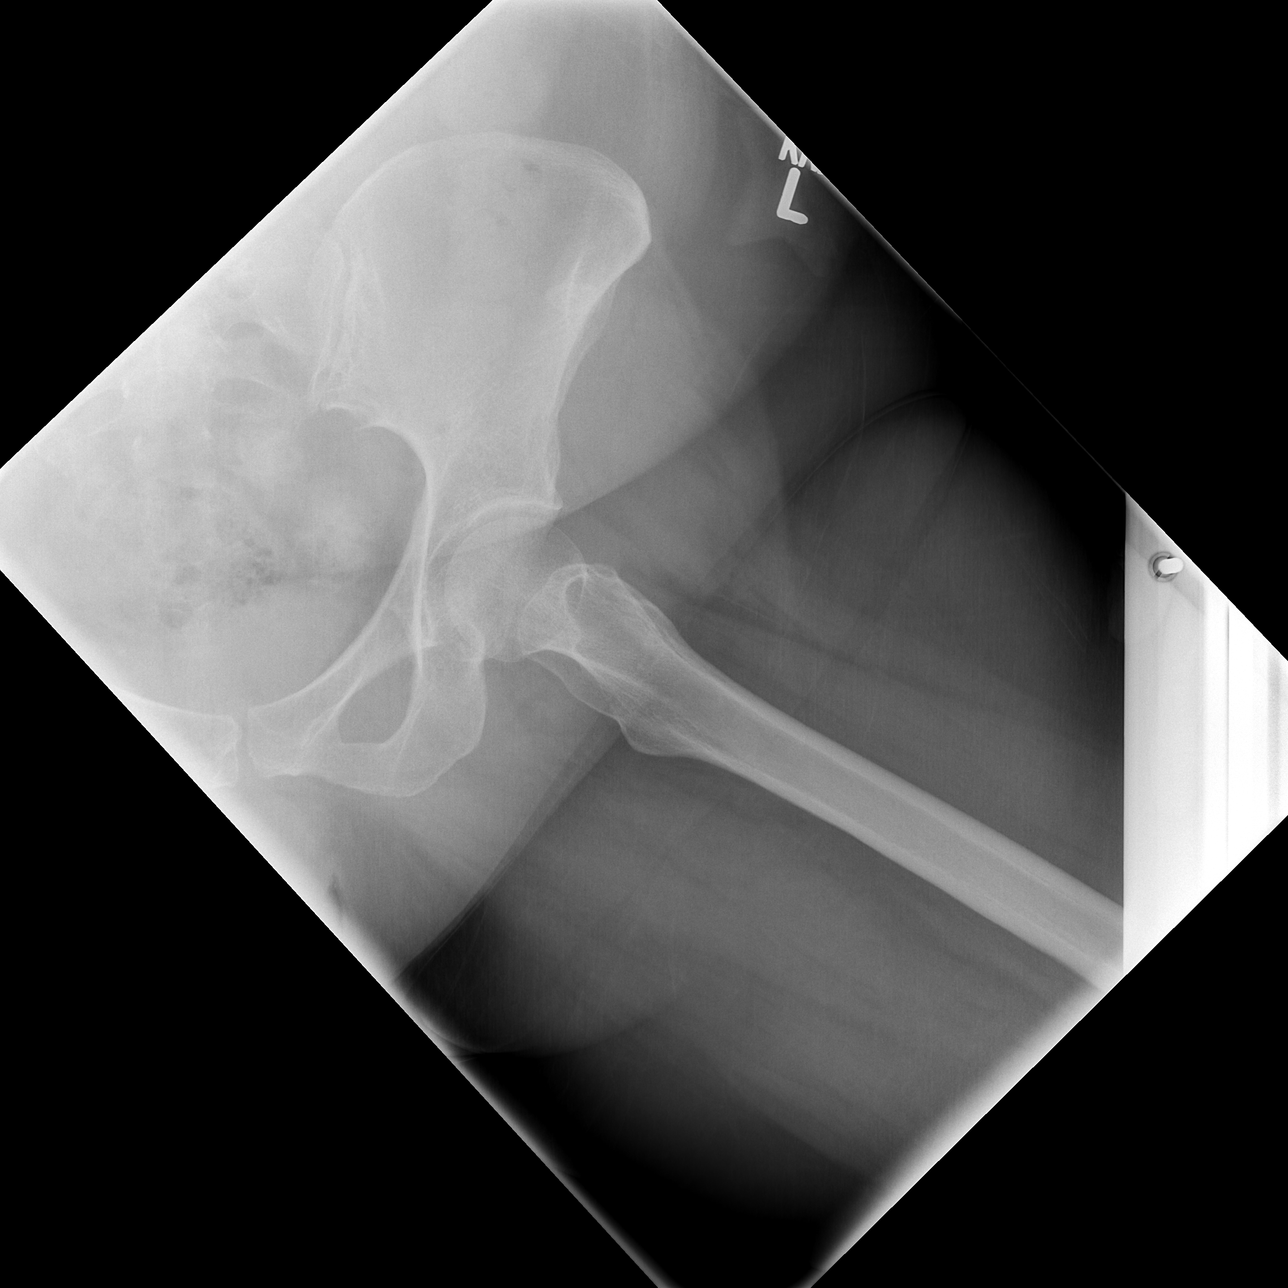

[3 of 3 positions shown; findings below may reference images not displayed]

FINDINGS: There is no evidence of fracture or dislocation.  There
is no evidence of arthropathy or other focal bone abnormality.
Soft tissues are unremarkable.
IMPRESSION: Negative exam.

## 2013-07-18 ENCOUNTER — Encounter: Payer: Self-pay | Admitting: Hematology & Oncology

## 2013-07-18 ENCOUNTER — Ambulatory Visit (HOSPITAL_BASED_OUTPATIENT_CLINIC_OR_DEPARTMENT_OTHER): Payer: Medicare Other

## 2013-07-18 ENCOUNTER — Other Ambulatory Visit (HOSPITAL_BASED_OUTPATIENT_CLINIC_OR_DEPARTMENT_OTHER): Payer: Managed Care, Other (non HMO) | Admitting: Lab

## 2013-07-18 ENCOUNTER — Ambulatory Visit (HOSPITAL_BASED_OUTPATIENT_CLINIC_OR_DEPARTMENT_OTHER): Payer: Managed Care, Other (non HMO) | Admitting: Hematology & Oncology

## 2013-07-18 VITALS — BP 118/77 | HR 84 | Temp 97.9°F | Resp 14 | Ht 62.0 in | Wt 163.0 lb

## 2013-07-18 DIAGNOSIS — C3490 Malignant neoplasm of unspecified part of unspecified bronchus or lung: Secondary | ICD-10-CM

## 2013-07-18 DIAGNOSIS — R109 Unspecified abdominal pain: Secondary | ICD-10-CM

## 2013-07-18 LAB — CBC WITH DIFFERENTIAL (CANCER CENTER ONLY)
BASO#: 0.1 10*3/uL (ref 0.0–0.2)
BASO%: 1.1 % (ref 0.0–2.0)
EOS ABS: 0.1 10*3/uL (ref 0.0–0.5)
EOS%: 1.5 % (ref 0.0–7.0)
HCT: 40.1 % (ref 34.8–46.6)
HEMOGLOBIN: 13.2 g/dL (ref 11.6–15.9)
LYMPH#: 1.8 10*3/uL (ref 0.9–3.3)
LYMPH%: 23.6 % (ref 14.0–48.0)
MCH: 28.9 pg (ref 26.0–34.0)
MCHC: 32.9 g/dL (ref 32.0–36.0)
MCV: 88 fL (ref 81–101)
MONO#: 0.3 10*3/uL (ref 0.1–0.9)
MONO%: 4.4 % (ref 0.0–13.0)
NEUT%: 69.4 % (ref 39.6–80.0)
NEUTROS ABS: 5.2 10*3/uL (ref 1.5–6.5)
PLATELETS: 218 10*3/uL (ref 145–400)
RBC: 4.57 10*6/uL (ref 3.70–5.32)
RDW: 15.5 % (ref 11.1–15.7)
WBC: 7.5 10*3/uL (ref 3.9–10.0)

## 2013-07-18 LAB — CMP (CANCER CENTER ONLY)
ALBUMIN: 3.9 g/dL (ref 3.3–5.5)
ALK PHOS: 70 U/L (ref 26–84)
ALT(SGPT): 20 U/L (ref 10–47)
AST: 22 U/L (ref 11–38)
BUN: 12 mg/dL (ref 7–22)
CALCIUM: 9.1 mg/dL (ref 8.0–10.3)
CHLORIDE: 103 meq/L (ref 98–108)
CO2: 27 mEq/L (ref 18–33)
Creat: 0.7 mg/dl (ref 0.6–1.2)
GLUCOSE: 103 mg/dL (ref 73–118)
POTASSIUM: 3.2 meq/L — AB (ref 3.3–4.7)
SODIUM: 141 meq/L (ref 128–145)
TOTAL PROTEIN: 7.9 g/dL (ref 6.4–8.1)
Total Bilirubin: 0.6 mg/dl (ref 0.20–1.60)

## 2013-07-18 MED ORDER — HEPARIN SOD (PORK) LOCK FLUSH 100 UNIT/ML IV SOLN
500.0000 [IU] | Freq: Once | INTRAVENOUS | Status: AC
  Administered 2013-07-18: 500 [IU] via INTRAVENOUS
  Filled 2013-07-18: qty 5

## 2013-07-18 MED ORDER — SODIUM CHLORIDE 0.9 % IJ SOLN
10.0000 mL | INTRAMUSCULAR | Status: DC | PRN
  Administered 2013-07-18: 10 mL via INTRAVENOUS
  Filled 2013-07-18: qty 10

## 2013-07-18 NOTE — Patient Instructions (Signed)

## 2013-07-19 NOTE — Progress Notes (Signed)
Hematology and Oncology Follow Up Visit  [name] [id number] 11-03-1950 63 y.o. 07/19/2013   Principle Diagnosis:   Locally recurrent adenocarcinoma of the lung-clinical remission  Post thoracotomy chest wall syndrome on the right  Hiatal hernia-progressively symptomatic  Current Therapy:    Observation     Interim History:  [name] is back for followup. She is doing okay. When we last saw her, we did an MRI of her lumbar spine. She was complaining of pain in the left foot. The MRI of the lumbar spine was done back in March. Everything looked okay with no obvious metastatic disease.  She still has her issues with pain in the upper abdomen. She has a hiatal hernia. She does not want to have the operative on.  Is no cough. No headache. She was in the hospital I think since we last saw her. She had had a urine infection. Migraine. She was not happy with how we she was treated in the hospital. This was one of her local hospitals.  She's had no rashes. She's had no change in bowel or bladder habits.  Medications: Current outpatient prescriptions:aspirin 81 MG tablet, Take 81 mg by mouth daily.  , Disp: , Rfl: ;  calcium-vitamin D (OSCAL) 250-125 MG-UNIT per tablet, Take 1 tablet by mouth daily.  , Disp: , Rfl: ;  Cholecalciferol (VITAMIN D) 2000 UNITS tablet, Take 2,000 Units by mouth daily., Disp: , Rfl: ;  Diphenhydramine-APAP, sleep, (TYLENOL PM EXTRA STRENGTH PO), Take by mouth at bedtime., Disp: , Rfl:  fish oil-omega-3 fatty acids 1000 MG capsule, Take 2 g by mouth daily.  , Disp: , Rfl: ;  lidocaine-prilocaine (EMLA) cream, Apply 1 application topically as needed.  , Disp: , Rfl: ;  losartan (COZAAR) 50 MG tablet, Take 50 mg by mouth daily. , Disp: , Rfl: ;  meloxicam (MOBIC) 15 MG tablet, Take 15 mg by mouth as needed. , Disp: , Rfl: ;  omeprazole (PRILOSEC) 20 MG capsule, 2 (two) times daily. , Disp: , Rfl:  PROVENTIL HFA 108 (90 BASE) MCG/ACT inhaler, as needed. , Disp: ,  Rfl: ;  SUMAtriptan (IMITREX) 100 MG tablet, Take 100 mg by mouth as needed. For H/A, Disp: , Rfl:   Allergies:  Allergies  Allergen Reactions  . Mefoxin [Cefoxitin Sodium In Dextrose]     rash  . Demerol   . Topamax   . Avelox [Moxifloxacin Hcl In Nacl]     Hypotension.    Past Medical History, Surgical history, Social history, and Family History were reviewed and updated.  Review of Systems: As above  Physical Exam:  height is 5\' 2"  (1.575 m) and weight is 163 lb (73.936 kg). Her oral temperature is 97.9 F (36.6 C). Her blood pressure is 118/77 and her pulse is 84. Her respiration is 14.   Well-developed and well-nourished. Lungs are clear. Cardiac exam regular in rhythm. Abdomen is soft. She has good bowel sounds. There is chronic tenderness in the upper abdomen. There is no palpable liver or spleen. Extremities shows no clubbing cyanosis or edema. Neurological exam shows no focal neurological deficits. Skin exam no rashes.  Lab Results  Component Value Date   WBC 7.5 07/18/2013   HGB 13.2 07/18/2013   HCT 40.1 07/18/2013   MCV 88 07/18/2013   PLT 218 07/18/2013     Chemistry      Component Value Date/Time   NA 141 07/18/2013 1034   NA 139 01/11/2013 0927   K  3.2* 07/18/2013 1034   K 4.0 01/11/2013 0927   CL 103 07/18/2013 1034   CL 102 01/11/2013 0927   CO2 27 07/18/2013 1034   CO2 28 01/11/2013 0927   BUN 12 07/18/2013 1034   BUN 13 01/11/2013 0927   CREATININE 0.7 07/18/2013 1034   CREATININE 0.62 01/11/2013 0927      Component Value Date/Time   CALCIUM 9.1 07/18/2013 1034   CALCIUM 9.4 01/11/2013 0927   ALKPHOS 70 07/18/2013 1034   ALKPHOS 69 01/11/2013 0927   AST 22 07/18/2013 1034   AST 17 01/11/2013 0927   ALT 20 07/18/2013 1034   ALT 12 01/11/2013 0927   BILITOT 0.60 07/18/2013 1034   BILITOT 0.5 01/11/2013 0981         Impression and Plan: [name] is 63 year old female with a past history of recurrent non-small cell lung cancer. She was few with radiation and  chemotherapy. She now is out by I believe 2 or 3 years. She's had no evidence of a recurrence today. She still is at high risk for recurrence. We will need to go ahead and do another scan on her. I will get a CT scan set up for when I see her back. This will save her an extra visit.  We will flush her Port-A-Cath.  I still feel that she is cured. I think it would be unusual for her to have recurrence.   Volanda Napoleon, MD 6/12/20154:54 PM

## 2013-08-12 ENCOUNTER — Other Ambulatory Visit (HOSPITAL_BASED_OUTPATIENT_CLINIC_OR_DEPARTMENT_OTHER): Payer: Managed Care, Other (non HMO)

## 2013-09-12 ENCOUNTER — Encounter: Payer: Self-pay | Admitting: Hematology & Oncology

## 2013-09-12 ENCOUNTER — Ambulatory Visit (HOSPITAL_BASED_OUTPATIENT_CLINIC_OR_DEPARTMENT_OTHER)
Admission: RE | Admit: 2013-09-12 | Discharge: 2013-09-12 | Disposition: A | Discharge status: Home / Self Care | Payer: Medicare Other | Source: Ambulatory Visit | Attending: Hematology & Oncology | Admitting: Hematology & Oncology

## 2013-09-12 ENCOUNTER — Ambulatory Visit (HOSPITAL_BASED_OUTPATIENT_CLINIC_OR_DEPARTMENT_OTHER): Payer: Medicare Other | Admitting: Hematology & Oncology

## 2013-09-12 ENCOUNTER — Other Ambulatory Visit (HOSPITAL_BASED_OUTPATIENT_CLINIC_OR_DEPARTMENT_OTHER): Payer: Medicare Other | Admitting: Lab

## 2013-09-12 ENCOUNTER — Ambulatory Visit: Payer: Medicare Other

## 2013-09-12 ENCOUNTER — Encounter (HOSPITAL_BASED_OUTPATIENT_CLINIC_OR_DEPARTMENT_OTHER): Payer: Self-pay

## 2013-09-12 VITALS — BP 135/86 | HR 63 | Temp 98.2°F | Resp 14 | Ht 62.0 in | Wt 165.0 lb

## 2013-09-12 DIAGNOSIS — E669 Obesity, unspecified: Secondary | ICD-10-CM

## 2013-09-12 DIAGNOSIS — C3400 Malignant neoplasm of unspecified main bronchus: Secondary | ICD-10-CM

## 2013-09-12 DIAGNOSIS — K2100 Gastro-esophageal reflux disease with esophagitis, without bleeding: Secondary | ICD-10-CM

## 2013-09-12 DIAGNOSIS — C3490 Malignant neoplasm of unspecified part of unspecified bronchus or lung: Secondary | ICD-10-CM | POA: Diagnosis not present

## 2013-09-12 DIAGNOSIS — Z85118 Personal history of other malignant neoplasm of bronchus and lung: Secondary | ICD-10-CM

## 2013-09-12 DIAGNOSIS — C3401 Malignant neoplasm of right main bronchus: Secondary | ICD-10-CM

## 2013-09-12 LAB — CBC WITH DIFFERENTIAL (CANCER CENTER ONLY)
BASO#: 0.1 10*3/uL (ref 0.0–0.2)
BASO%: 1.4 % (ref 0.0–2.0)
EOS%: 3.5 % (ref 0.0–7.0)
Eosinophils Absolute: 0.2 10*3/uL (ref 0.0–0.5)
HCT: 39.7 % (ref 34.8–46.6)
HEMOGLOBIN: 13 g/dL (ref 11.6–15.9)
LYMPH#: 1.6 10*3/uL (ref 0.9–3.3)
LYMPH%: 23.4 % (ref 14.0–48.0)
MCH: 28.5 pg (ref 26.0–34.0)
MCHC: 32.7 g/dL (ref 32.0–36.0)
MCV: 87 fL (ref 81–101)
MONO#: 0.4 10*3/uL (ref 0.1–0.9)
MONO%: 6.3 % (ref 0.0–13.0)
NEUT#: 4.5 10*3/uL (ref 1.5–6.5)
NEUT%: 65.4 % (ref 39.6–80.0)
Platelets: 224 10*3/uL (ref 145–400)
RBC: 4.56 10*6/uL (ref 3.70–5.32)
RDW: 14.5 % (ref 11.1–15.7)
WBC: 6.9 10*3/uL (ref 3.9–10.0)

## 2013-09-12 LAB — CMP (CANCER CENTER ONLY)
ALBUMIN: 3.5 g/dL (ref 3.3–5.5)
ALT: 18 U/L (ref 10–47)
AST: 21 U/L (ref 11–38)
Alkaline Phosphatase: 72 U/L (ref 26–84)
BUN, Bld: 12 mg/dL (ref 7–22)
CALCIUM: 8.7 mg/dL (ref 8.0–10.3)
CO2: 27 mEq/L (ref 18–33)
Chloride: 98 mEq/L (ref 98–108)
Creat: 0.6 mg/dl (ref 0.6–1.2)
Glucose, Bld: 90 mg/dL (ref 73–118)
POTASSIUM: 3.2 meq/L — AB (ref 3.3–4.7)
Sodium: 140 mEq/L (ref 128–145)
Total Bilirubin: 0.5 mg/dl (ref 0.20–1.60)
Total Protein: 7.3 g/dL (ref 6.4–8.1)

## 2013-09-12 MED ORDER — IOHEXOL 300 MG/ML  SOLN
80.0000 mL | Freq: Once | INTRAMUSCULAR | Status: AC | PRN
  Administered 2013-09-12: 80 mL via INTRAVENOUS

## 2013-09-12 MED ORDER — LORCASERIN HCL 10 MG PO TABS
10.0000 mg | ORAL_TABLET | Freq: Every day | ORAL | Status: DC
Start: 2013-09-12 — End: 2014-09-02

## 2013-09-12 MED ORDER — SUCRALFATE 1 GM/10ML PO SUSP: 1.0000 g | Freq: Three times a day (TID) | ORAL | Status: DC

## 2013-09-13 ENCOUNTER — Telehealth: Payer: Self-pay | Admitting: Hematology & Oncology

## 2013-09-13 NOTE — Telephone Encounter (Signed)
Pt moved 9-15 MD to 10-30 she is not going to take meds. MD aware pt moved appointments

## 2013-09-15 NOTE — Progress Notes (Signed)
Hematology and Oncology Follow Up Visit  [name] [id number] 1950/12/11 63 y.o. 09/15/2013   Principle Diagnosis:   Locally recurrent adenocarcinoma of the lung-clinical remission  Post thoracotomy chest wall syndrome on the right  Hiatal hernia-progressively symptomatic  Current Therapy:    Observation     Interim History:  [name] is back for followup. We did a going to get a CT scan of her chest. This was done today. There is no evidence of recurrent disease. She has postoperative changes. She has some radiation changes. The upper abdomen looked okay. The only 1 dose did not show any metastasis.  She still worried about her weight. She wants someone to lose weight. I did go ahead and give her a prescription for Belviq, which is one of the new weight loss agents. Am I sure that she will go for this or if her insurance will approve this.  She is still complaining of reflux. We will try her on some Carafate.  She's had no bleeding. She's had no headache. She's had no leg swelling. She's had no rashes.  Medications: Current outpatient prescriptions:aspirin 81 MG tablet, Take 81 mg by mouth daily.  , Disp: , Rfl: ;  benzonatate (TESSALON) 200 MG capsule, Take 200 mg by mouth 3 (three) times daily as needed for cough., Disp: , Rfl: ;  calcium-vitamin D (OSCAL) 250-125 MG-UNIT per tablet, Take 1 tablet by mouth daily.  , Disp: , Rfl: ;  cefUROXime (CEFTIN) 500 MG tablet, Take by mouth 2 (two) times daily with a meal. , Disp: , Rfl:  Cholecalciferol (VITAMIN D) 2000 UNITS tablet, Take 2,000 Units by mouth daily., Disp: , Rfl: ;  Diphenhydramine-APAP, sleep, (TYLENOL PM EXTRA STRENGTH PO), Take by mouth at bedtime., Disp: , Rfl: ;  fish oil-omega-3 fatty acids 1000 MG capsule, Take 2 g by mouth daily.  , Disp: , Rfl: ;  lidocaine-prilocaine (EMLA) cream, Apply 1 application topically as needed.  , Disp: , Rfl:  losartan (COZAAR) 50 MG tablet, Take 50 mg by mouth daily. , Disp: , Rfl: ;   meloxicam (MOBIC) 15 MG tablet, Take 15 mg by mouth as needed. , Disp: , Rfl: ;  omeprazole (PRILOSEC) 20 MG capsule, 2 (two) times daily. , Disp: , Rfl: ;  PROVENTIL HFA 108 (90 BASE) MCG/ACT inhaler, as needed. , Disp: , Rfl: ;  SUMAtriptan (IMITREX) 100 MG tablet, Take 100 mg by mouth as needed. For H/A, Disp: , Rfl:  Lorcaserin HCl (BELVIQ) 10 MG TABS, Take 10 mg by mouth daily., Disp: 30 tablet, Rfl: 0;  sucralfate (CARAFATE) 1 GM/10ML suspension, Take 10 mLs (1 g total) by mouth 4 (four) times daily -  with meals and at bedtime., Disp: 420 mL, Rfl: 4  Allergies:  Allergies  Allergen Reactions  . Mefoxin [Cefoxitin Sodium In Dextrose]     rash  . Demerol   . Topamax   . Avelox [Moxifloxacin Hcl In Nacl]     Hypotension.    Past Medical History, Surgical history, Social history, and Family History were reviewed and updated.  Review of Systems: As above  Physical Exam:  height is 5\' 2"  (1.575 m) and weight is 165 lb (74.844 kg). Her oral temperature is 98.2 F (36.8 C). Her blood pressure is 135/86 and her pulse is 63. Her respiration is 14.   Well-developed and well-nourished white female. Head and neck exam shows no ocular or oral. There are no palpable cervical or supraclavicular lymph nodes. Lungs  are clear. Cardiac exam regular rate and rhythm with no murmurs rubs or bruits. Abdomen is soft. Has good bowel sounds. There is no fluid wave. There is no palpable liver or spleen tip. Back exam shows a thoracotomy scar in the right lateral chest wall. This is well-healed. No tenderness is noted over the back. Extremities shows no clubbing cyanosis or edema. Skin exam no rashes.  Lab Results  Component Value Date   WBC 6.9 09/12/2013   HGB 13.0 09/12/2013   HCT 39.7 09/12/2013   MCV 87 09/12/2013   PLT 224 09/12/2013     Chemistry      Component Value Date/Time   NA 140 09/12/2013 0924   NA 139 01/11/2013 0927   K 3.2* 09/12/2013 0924   K 4.0 01/11/2013 0927   CL 98 09/12/2013 0924   CL 102  01/11/2013 0927   CO2 27 09/12/2013 0924   CO2 28 01/11/2013 0927   BUN 12 09/12/2013 0924   BUN 13 01/11/2013 0927   CREATININE 0.6 09/12/2013 0924   CREATININE 0.62 01/11/2013 0927      Component Value Date/Time   CALCIUM 8.7 09/12/2013 0924   CALCIUM 9.4 01/11/2013 0927   ALKPHOS 72 09/12/2013 0924   ALKPHOS 69 01/11/2013 0927   AST 21 09/12/2013 0924   AST 17 01/11/2013 0927   ALT 18 09/12/2013 0924   ALT 12 01/11/2013 0927   BILITOT 0.50 09/12/2013 0924   BILITOT 0.5 01/11/2013 0927         Impression and Plan: [name] is 63 year old white female with history of recurrent adenocarcinoma of the lung. She's had local recurrence. She is a bit too with radiation and chemotherapy for that recurrence. It probably now has been 3 years since she had recurrence. She still is at risk.  I don't think we have to do any scans on her probably for 6 months.  She has other health issues. The weight really is bothering her. She really needs to see her family doctor to try to help manage this.  I will plan to see her back in another 3 months.  Has a Port-A-Cath in. We will have this flushed in 6 weeks and then when we see her back.  Kara Napoleon, MD 64/9/20158:56 AM

## 2013-09-19 ENCOUNTER — Other Ambulatory Visit (HOSPITAL_BASED_OUTPATIENT_CLINIC_OR_DEPARTMENT_OTHER): Payer: Managed Care, Other (non HMO)

## 2013-09-19 ENCOUNTER — Ambulatory Visit: Payer: Managed Care, Other (non HMO) | Admitting: Hematology & Oncology

## 2013-09-19 ENCOUNTER — Other Ambulatory Visit: Payer: Managed Care, Other (non HMO) | Admitting: Lab

## 2013-10-22 ENCOUNTER — Other Ambulatory Visit: Payer: Medicare Other | Admitting: Lab

## 2013-10-22 ENCOUNTER — Ambulatory Visit: Payer: Medicare Other | Admitting: Hematology & Oncology

## 2013-10-29 ENCOUNTER — Other Ambulatory Visit (HOSPITAL_BASED_OUTPATIENT_CLINIC_OR_DEPARTMENT_OTHER): Payer: Medicare Other | Admitting: Lab

## 2013-10-29 ENCOUNTER — Ambulatory Visit: Payer: Medicare Other

## 2013-10-29 VITALS — BP 108/77 | HR 80 | Temp 98.5°F | Resp 16

## 2013-10-29 DIAGNOSIS — D649 Anemia, unspecified: Secondary | ICD-10-CM

## 2013-10-29 DIAGNOSIS — C3401 Malignant neoplasm of right main bronchus: Secondary | ICD-10-CM

## 2013-10-29 DIAGNOSIS — R7989 Other specified abnormal findings of blood chemistry: Secondary | ICD-10-CM

## 2013-10-29 LAB — CBC WITH DIFFERENTIAL (CANCER CENTER ONLY)
BASO#: 0 10*3/uL (ref 0.0–0.2)
BASO%: 0.6 % (ref 0.0–2.0)
EOS%: 1.1 % (ref 0.0–7.0)
Eosinophils Absolute: 0.1 10*3/uL (ref 0.0–0.5)
HEMATOCRIT: 36.9 % (ref 34.8–46.6)
HEMOGLOBIN: 12.4 g/dL (ref 11.6–15.9)
LYMPH#: 2 10*3/uL (ref 0.9–3.3)
LYMPH%: 27.4 % (ref 14.0–48.0)
MCH: 28.4 pg (ref 26.0–34.0)
MCHC: 33.6 g/dL (ref 32.0–36.0)
MCV: 84 fL (ref 81–101)
MONO#: 0.5 10*3/uL (ref 0.1–0.9)
MONO%: 7.1 % (ref 0.0–13.0)
NEUT#: 4.6 10*3/uL (ref 1.5–6.5)
NEUT%: 63.8 % (ref 39.6–80.0)
Platelets: 194 10*3/uL (ref 145–400)
RBC: 4.37 10*6/uL (ref 3.70–5.32)
RDW: 15.2 % (ref 11.1–15.7)
WBC: 7.2 10*3/uL (ref 3.9–10.0)

## 2013-10-29 LAB — IRON AND TIBC CHCC
%SAT: 27 % (ref 21–57)
Iron: 64 ug/dL (ref 41–142)
TIBC: 233 ug/dL — AB (ref 236–444)
UIBC: 169 ug/dL (ref 120–384)

## 2013-10-29 LAB — COMPREHENSIVE METABOLIC PANEL
ALBUMIN: 3.5 g/dL (ref 3.5–5.2)
ALK PHOS: 52 U/L (ref 39–117)
ALT: 12 U/L (ref 0–35)
AST: 12 U/L (ref 0–37)
BUN: 13 mg/dL (ref 6–23)
CO2: 20 mEq/L (ref 19–32)
Calcium: 8.1 mg/dL — ABNORMAL LOW (ref 8.4–10.5)
Chloride: 114 mEq/L — ABNORMAL HIGH (ref 96–112)
Creatinine, Ser: 0.51 mg/dL (ref 0.50–1.10)
GLUCOSE: 110 mg/dL — AB (ref 70–99)
Potassium: 3 mEq/L — ABNORMAL LOW (ref 3.5–5.3)
SODIUM: 143 meq/L (ref 135–145)
TOTAL PROTEIN: 5.6 g/dL — AB (ref 6.0–8.3)
Total Bilirubin: 0.2 mg/dL (ref 0.2–1.2)

## 2013-10-29 LAB — VITAMIN B12: Vitamin B-12: 220 pg/mL (ref 211–911)

## 2013-10-29 LAB — FERRITIN CHCC: Ferritin: 47 ng/ml (ref 9–269)

## 2013-10-29 MED ORDER — HEPARIN SOD (PORK) LOCK FLUSH 100 UNIT/ML IV SOLN
500.0000 [IU] | Freq: Once | INTRAVENOUS | Status: AC
  Administered 2013-10-29: 500 [IU] via INTRAVENOUS
  Filled 2013-10-29: qty 5

## 2013-10-29 MED ORDER — SODIUM CHLORIDE 0.9 % IJ SOLN
10.0000 mL | INTRAMUSCULAR | Status: DC | PRN
  Administered 2013-10-29: 10 mL via INTRAVENOUS
  Filled 2013-10-29: qty 10

## 2013-10-29 NOTE — Patient Instructions (Signed)

## 2013-10-29 NOTE — Progress Notes (Signed)
Pt complained of weakness in legs. Pt states she used to take Vitamin B12. Informed MD of pt's symptoms and her statement. MD ordered to draw Vit B12 level. B12 level was drawn. Pt informed that she will be contacted when the result comes back.

## 2013-10-30 ENCOUNTER — Telehealth: Payer: Self-pay

## 2013-10-30 ENCOUNTER — Other Ambulatory Visit: Payer: Self-pay

## 2013-10-30 MED ORDER — POTASSIUM CHLORIDE CRYS ER 20 MEQ PO TBCR: 20.0000 meq | EXTENDED_RELEASE_TABLET | Freq: Every day | ORAL | Status: DC

## 2013-10-30 NOTE — Telephone Encounter (Addendum)
Message copied by Johny Drilling on Wed Oct 30, 2013 12:46 PM ------      Message from: Burney Gauze R      Created: Wed Oct 30, 2013  7:26 AM       Call - potassium is low!!! Need K-Dur 26mEq q day.  Please order this for her!!pete ------ Left message on VM to contact our office for results and instructions. E-prescribed K-dur. dph

## 2013-10-31 ENCOUNTER — Encounter: Payer: Self-pay | Admitting: Nurse Practitioner

## 2013-10-31 NOTE — Progress Notes (Signed)
Contact pt and LVM on her personal machine informing her that per Dr. Marin Olp her labwork looked good. This includes her anemia panel. Instructed her to contact office with any further questions or concerns.

## 2013-12-05 ENCOUNTER — Ambulatory Visit: Payer: Medicare Other | Admitting: Hematology & Oncology

## 2013-12-05 ENCOUNTER — Other Ambulatory Visit: Payer: Medicare Other | Admitting: Lab

## 2013-12-06 ENCOUNTER — Other Ambulatory Visit: Payer: Self-pay | Admitting: Nurse Practitioner

## 2013-12-06 ENCOUNTER — Ambulatory Visit (HOSPITAL_BASED_OUTPATIENT_CLINIC_OR_DEPARTMENT_OTHER): Payer: Medicare Other | Admitting: Hematology & Oncology

## 2013-12-06 ENCOUNTER — Encounter: Payer: Self-pay | Admitting: Hematology & Oncology

## 2013-12-06 ENCOUNTER — Other Ambulatory Visit (HOSPITAL_BASED_OUTPATIENT_CLINIC_OR_DEPARTMENT_OTHER): Payer: Medicare Other | Admitting: Lab

## 2013-12-06 ENCOUNTER — Ambulatory Visit (HOSPITAL_BASED_OUTPATIENT_CLINIC_OR_DEPARTMENT_OTHER): Payer: Medicare Other

## 2013-12-06 VITALS — BP 111/81 | HR 87 | Temp 98.2°F | Resp 14 | Ht 62.0 in | Wt 162.0 lb

## 2013-12-06 DIAGNOSIS — C3401 Malignant neoplasm of right main bronchus: Secondary | ICD-10-CM

## 2013-12-06 DIAGNOSIS — Z85118 Personal history of other malignant neoplasm of bronchus and lung: Secondary | ICD-10-CM

## 2013-12-06 DIAGNOSIS — E669 Obesity, unspecified: Secondary | ICD-10-CM

## 2013-12-06 DIAGNOSIS — K2100 Gastro-esophageal reflux disease with esophagitis, without bleeding: Secondary | ICD-10-CM

## 2013-12-06 DIAGNOSIS — Z23 Encounter for immunization: Secondary | ICD-10-CM

## 2013-12-06 LAB — CBC WITH DIFFERENTIAL (CANCER CENTER ONLY)
BASO#: 0.1 10*3/uL (ref 0.0–0.2)
BASO%: 0.9 % (ref 0.0–2.0)
EOS ABS: 0.1 10*3/uL (ref 0.0–0.5)
EOS%: 1.2 % (ref 0.0–7.0)
HCT: 37.7 % (ref 34.8–46.6)
HGB: 12.3 g/dL (ref 11.6–15.9)
LYMPH#: 2 10*3/uL (ref 0.9–3.3)
LYMPH%: 24.8 % (ref 14.0–48.0)
MCH: 28 pg (ref 26.0–34.0)
MCHC: 32.6 g/dL (ref 32.0–36.0)
MCV: 86 fL (ref 81–101)
MONO#: 0.5 10*3/uL (ref 0.1–0.9)
MONO%: 6.4 % (ref 0.0–13.0)
NEUT%: 66.7 % (ref 39.6–80.0)
NEUTROS ABS: 5.4 10*3/uL (ref 1.5–6.5)
PLATELETS: 215 10*3/uL (ref 145–400)
RBC: 4.4 10*6/uL (ref 3.70–5.32)
RDW: 15.8 % — AB (ref 11.1–15.7)
WBC: 8.1 10*3/uL (ref 3.9–10.0)

## 2013-12-06 LAB — CMP (CANCER CENTER ONLY)
ALK PHOS: 58 U/L (ref 26–84)
ALT: 18 U/L (ref 10–47)
AST: 15 U/L (ref 11–38)
Albumin: 3.7 g/dL (ref 3.3–5.5)
BILIRUBIN TOTAL: 0.5 mg/dL (ref 0.20–1.60)
BUN, Bld: 11 mg/dL (ref 7–22)
CO2: 25 mEq/L (ref 18–33)
Calcium: 8.8 mg/dL (ref 8.0–10.3)
Chloride: 101 mEq/L (ref 98–108)
Creat: 0.8 mg/dl (ref 0.6–1.2)
Glucose, Bld: 100 mg/dL (ref 73–118)
Potassium: 3.2 mEq/L — ABNORMAL LOW (ref 3.3–4.7)
Sodium: 144 mEq/L (ref 128–145)
Total Protein: 7.1 g/dL (ref 6.4–8.1)

## 2013-12-06 LAB — LACTATE DEHYDROGENASE: LDH: 187 U/L (ref 94–250)

## 2013-12-06 MED ORDER — HEPARIN SOD (PORK) LOCK FLUSH 100 UNIT/ML IV SOLN
500.0000 [IU] | Freq: Once | INTRAVENOUS | Status: AC | PRN
  Administered 2013-12-06: 500 [IU] via INTRAVENOUS
  Filled 2013-12-06: qty 5

## 2013-12-06 MED ORDER — SODIUM CHLORIDE 0.9 % IJ SOLN
10.0000 mL | INTRAMUSCULAR | Status: DC | PRN
Start: 2013-12-06 — End: 2016-11-18
  Administered 2013-12-06: 10 mL via INTRAVENOUS
  Filled 2013-12-06: qty 10

## 2013-12-06 MED ORDER — INFLUENZA VAC SPLIT QUAD 0.5 ML IM SUSY
0.5000 mL | PREFILLED_SYRINGE | Freq: Once | INTRAMUSCULAR | Status: AC
  Administered 2013-12-06: 0.5 mL via INTRAMUSCULAR
  Filled 2013-12-06: qty 0.5

## 2013-12-08 NOTE — Progress Notes (Signed)
Hematology and Oncology Follow Up Visit  [name] [id number] [birth year] 63 y.o. 12/08/2013   Principle Diagnosis:   Locally recurrent adenocarcinoma of the lung-clinical remission  Post thoracotomy chest wall syndrome on the right  Hiatal hernia-progressively symptomatic  Current Therapy:    Observation     Interim History:  [name] is back for followup. She really is in a lot of stress now. There are a lot of family problems that she is try to deal with. Apparently, her son, who is a drug addict and alcoholic had a baby with some woman. This really has upset her. He has been stealing from her. She just cannot talk him any longer.  Her mother also is having problems. Her mother is having a lot of health problems. This is really wearing out [name].  She still worried about her weight. She is losing some weight. I did go ahead and give her a prescription for Belviq, but she has not filled this. She cannot afford this. She is still complaining of reflux. We will try her on some Carafate.  She's had no bleeding. She's had no headache. She's had no leg swelling. She's had no rashes.  Medications: Current outpatient prescriptions: aspirin 81 MG tablet, Take 81 mg by mouth daily.  , Disp: , Rfl: ;  calcium-vitamin D (OSCAL) 250-125 MG-UNIT per tablet, Take 1 tablet by mouth daily.  , Disp: , Rfl: ;  Cholecalciferol (VITAMIN D) 2000 UNITS tablet, Take 2,000 Units by mouth daily., Disp: , Rfl: ;  Diphenhydramine-APAP, sleep, (TYLENOL PM EXTRA STRENGTH PO), Take by mouth at bedtime., Disp: , Rfl:  fish oil-omega-3 fatty acids 1000 MG capsule, Take 2 g by mouth daily.  , Disp: , Rfl: ;  lidocaine-prilocaine (EMLA) cream, Apply 1 application topically as needed.  , Disp: , Rfl: ;  Lorcaserin HCl (BELVIQ) 10 MG TABS, Take 10 mg by mouth daily., Disp: 30 tablet, Rfl: 0;  losartan (COZAAR) 50 MG tablet, Take 50 mg by mouth daily. , Disp: , Rfl: ;  omeprazole (PRILOSEC) 20 MG capsule, 2  (two) times daily. , Disp: , Rfl:  potassium chloride SA (K-DUR,KLOR-CON) 20 MEQ tablet, Take 1 tablet (20 mEq total) by mouth daily., Disp: 30 tablet, Rfl: 3;  PROVENTIL HFA 108 (90 BASE) MCG/ACT inhaler, as needed. , Disp: , Rfl: ;  SUMAtriptan (IMITREX) 100 MG tablet, Take 100 mg by mouth as needed. For H/A, Disp: , Rfl:  No current facility-administered medications for this visit. Facility-Administered Medications Ordered in Other Visits: sodium chloride 0.9 % injection 10 mL, 10 mL, Intravenous, PRN, Golden Pop, FNP, 10 mL at 12/06/13 1149  Allergies:  Allergies  Allergen Reactions  . Mefoxin [Cefoxitin Sodium In Dextrose]     rash  . Demerol   . Topamax   . Avelox [Moxifloxacin Hcl In Nacl]     Hypotension.    Past Medical History, Surgical history, Social history, and Family History were reviewed and updated.  Review of Systems: As above  Physical Exam:  height is 5\' 2"  (1.575 m) and weight is 162 lb (73.483 kg). Her oral temperature is 98.2 F (36.8 C). Her blood pressure is 111/81 and her pulse is 87. Her respiration is 14.   Well-developed and well-nourished white female. Head and neck exam shows no ocular or oral. There are no palpable cervical or supraclavicular lymph nodes. Lungs are clear. Cardiac exam regular rate and rhythm with no murmurs rubs or bruits. Abdomen is soft. Has  good bowel sounds. There is no fluid wave. There is no palpable liver or spleen tip. Back exam shows a thoracotomy scar in the right lateral chest wall. This is well-healed. No tenderness is noted over the back. Extremities shows no clubbing cyanosis or edema. Skin exam no rashes.  Lab Results  Component Value Date   WBC 8.1 12/06/2013   HGB 12.3 12/06/2013   HCT 37.7 12/06/2013   MCV 86 12/06/2013   PLT 215 12/06/2013     Chemistry      Component Value Date/Time   NA 144 12/06/2013 1053   NA 143 10/29/2013 1125   K 3.2* 12/06/2013 1053   K 3.0* 10/29/2013 1125   CL 101 12/06/2013  1053   CL 114* 10/29/2013 1125   CO2 25 12/06/2013 1053   CO2 20 10/29/2013 1125   BUN 11 12/06/2013 1053   BUN 13 10/29/2013 1125   CREATININE 0.8 12/06/2013 1053   CREATININE 0.51 10/29/2013 1125      Component Value Date/Time   CALCIUM 8.8 12/06/2013 1053   CALCIUM 8.1* 10/29/2013 1125   ALKPHOS 58 12/06/2013 1053   ALKPHOS 52 10/29/2013 1125   AST 15 12/06/2013 1053   AST 12 10/29/2013 1125   ALT 18 12/06/2013 1053   ALT 12 10/29/2013 1125   BILITOT 0.50 12/06/2013 1053   BILITOT 0.2 10/29/2013 1125         Impression and Plan: [name] is 63 year old white female with history of recurrent adenocarcinoma of the lung. She's had local recurrence. She is a bit too with radiation and chemotherapy for that recurrence. It probably now has been 3 years since she had recurrence.she finished her treatments back in April 2012.  She still is at risk.  I spent about 30 minutes talking with her today talking about all of her family issues. The time spent with her to try to listen to her and to hear what type of problems she is trying to deal with is very important. Thankfully, her husband has been very very good history I do help as much as he can.  I will plan to see her back in another 3 months.  Has a Port-A-Cath is still in. We will have this flushed in 6 weeks and then when we see her back.  It has now been 10 years since I first saw her. She has done incredibly well.  Kara Napoleon, MD 11/1/20158:56 AM

## 2014-01-13 ENCOUNTER — Other Ambulatory Visit (HOSPITAL_BASED_OUTPATIENT_CLINIC_OR_DEPARTMENT_OTHER): Payer: Medicare Other | Admitting: Lab

## 2014-01-13 ENCOUNTER — Ambulatory Visit (HOSPITAL_BASED_OUTPATIENT_CLINIC_OR_DEPARTMENT_OTHER): Payer: Medicare Other | Admitting: Hematology & Oncology

## 2014-01-13 ENCOUNTER — Encounter: Payer: Self-pay | Admitting: Hematology & Oncology

## 2014-01-13 ENCOUNTER — Ambulatory Visit: Payer: Medicare Other

## 2014-01-13 VITALS — BP 133/77 | HR 89 | Temp 98.3°F | Resp 18 | Wt 163.0 lb

## 2014-01-13 DIAGNOSIS — Z85118 Personal history of other malignant neoplasm of bronchus and lung: Secondary | ICD-10-CM

## 2014-01-13 DIAGNOSIS — C3401 Malignant neoplasm of right main bronchus: Secondary | ICD-10-CM

## 2014-01-13 DIAGNOSIS — C3411 Malignant neoplasm of upper lobe, right bronchus or lung: Secondary | ICD-10-CM

## 2014-01-13 DIAGNOSIS — G4701 Insomnia due to medical condition: Secondary | ICD-10-CM

## 2014-01-13 LAB — CBC WITH DIFFERENTIAL (CANCER CENTER ONLY)
BASO#: 0.1 10*3/uL (ref 0.0–0.2)
BASO%: 0.8 % (ref 0.0–2.0)
EOS%: 2 % (ref 0.0–7.0)
Eosinophils Absolute: 0.2 10*3/uL (ref 0.0–0.5)
HEMATOCRIT: 37.2 % (ref 34.8–46.6)
HGB: 12.2 g/dL (ref 11.6–15.9)
LYMPH#: 2 10*3/uL (ref 0.9–3.3)
LYMPH%: 23.7 % (ref 14.0–48.0)
MCH: 28.1 pg (ref 26.0–34.0)
MCHC: 32.8 g/dL (ref 32.0–36.0)
MCV: 86 fL (ref 81–101)
MONO#: 0.5 10*3/uL (ref 0.1–0.9)
MONO%: 5.7 % (ref 0.0–13.0)
NEUT#: 5.7 10*3/uL (ref 1.5–6.5)
NEUT%: 67.8 % (ref 39.6–80.0)
Platelets: 229 10*3/uL (ref 145–400)
RBC: 4.34 10*6/uL (ref 3.70–5.32)
RDW: 15.3 % (ref 11.1–15.7)
WBC: 8.4 10*3/uL (ref 3.9–10.0)

## 2014-01-13 LAB — CMP (CANCER CENTER ONLY)
ALK PHOS: 69 U/L (ref 26–84)
ALT(SGPT): 20 U/L (ref 10–47)
AST: 20 U/L (ref 11–38)
Albumin: 3.5 g/dL (ref 3.3–5.5)
BILIRUBIN TOTAL: 0.5 mg/dL (ref 0.20–1.60)
BUN, Bld: 16 mg/dL (ref 7–22)
CO2: 26 mEq/L (ref 18–33)
CREATININE: 0.7 mg/dL (ref 0.6–1.2)
Calcium: 9 mg/dL (ref 8.0–10.3)
Chloride: 106 mEq/L (ref 98–108)
GLUCOSE: 104 mg/dL (ref 73–118)
Potassium: 3.7 mEq/L (ref 3.3–4.7)
SODIUM: 143 meq/L (ref 128–145)
TOTAL PROTEIN: 7.6 g/dL (ref 6.4–8.1)

## 2014-01-13 MED ORDER — HEPARIN SOD (PORK) LOCK FLUSH 100 UNIT/ML IV SOLN
500.0000 [IU] | Freq: Once | INTRAVENOUS | Status: AC
  Administered 2014-01-13: 500 [IU] via INTRAVENOUS
  Filled 2014-01-13: qty 5

## 2014-01-13 MED ORDER — SODIUM CHLORIDE 0.9 % IJ SOLN
10.0000 mL | INTRAMUSCULAR | Status: DC | PRN
  Administered 2014-01-13: 10 mL via INTRAVENOUS
  Filled 2014-01-13: qty 10

## 2014-01-13 MED ORDER — DOXEPIN HCL 6 MG PO TABS: 6.0000 mg | ORAL_TABLET | Freq: Every evening | ORAL | Status: DC | PRN

## 2014-01-13 NOTE — Progress Notes (Signed)
Hematology and Oncology Follow Up Visit  [name] [id number] January 23, 1951 63 y.o. 01/13/2014   Principle Diagnosis:   Locally recurrent adenocarcinoma of the lung-clinical remission  Post thoracotomy chest wall syndrome on the right  Hiatal hernia-progressively symptomatic  Current Therapy:    Observation     Interim History:  [name] is back for followup. She still has a lot of stress now. There are still a lot of family problems that she is try to deal with. Apparently, her son, who is a drug addict and alcoholic had a baby with some woman. This really has upset her. He has been stealing from her. She just cannot talk him any longer. She has a daughter whose husband is being abusive.  Her mother also is having problems. Her mother is having a lot of health problems. This is really wearing down [name].  She still worried about her weight. She is losing some weight. She is trying to watch what she eats.  She still is not sleeping well. I will try her on some  Silenor at 6 mg by mouth daily at bedtime.  She's had no bleeding. She's had no headache. She's had no leg swelling. She's had no rashes.  Medications: Current outpatient prescriptions: aspirin 81 MG tablet, Take 81 mg by mouth daily.  , Disp: , Rfl: ;  calcium-vitamin D (OSCAL) 250-125 MG-UNIT per tablet, Take 1 tablet by mouth daily.  , Disp: , Rfl: ;  Cholecalciferol (VITAMIN D) 2000 UNITS tablet, Take 2,000 Units by mouth daily., Disp: , Rfl: ;  Doxepin HCl (SILENOR) 6 MG TABS, Take 1 tablet (6 mg total) by mouth at bedtime as needed., Disp: 30 tablet, Rfl: 4 fish oil-omega-3 fatty acids 1000 MG capsule, Take 2 g by mouth daily.  , Disp: , Rfl: ;  lidocaine-prilocaine (EMLA) cream, Apply 1 application topically as needed.  , Disp: , Rfl: ;  Lorcaserin HCl (BELVIQ) 10 MG TABS, Take 10 mg by mouth daily., Disp: 30 tablet, Rfl: 0;  losartan (COZAAR) 50 MG tablet, Take 50 mg by mouth daily. , Disp: , Rfl: ;  omeprazole  (PRILOSEC) 40 MG capsule, Take 40 mg by mouth daily., Disp: , Rfl: 3 potassium chloride SA (K-DUR,KLOR-CON) 20 MEQ tablet, Take 1 tablet (20 mEq total) by mouth daily., Disp: 30 tablet, Rfl: 3;  PROVENTIL HFA 108 (90 BASE) MCG/ACT inhaler, as needed. , Disp: , Rfl: ;  SUMAtriptan (IMITREX) 100 MG tablet, Take 100 mg by mouth as needed. For H/A, Disp: , Rfl:  No current facility-administered medications for this visit. Facility-Administered Medications Ordered in Other Visits: sodium chloride 0.9 % injection 10 mL, 10 mL, Intravenous, PRN, Golden Pop, FNP, 10 mL at 12/06/13 1149  Allergies:  Allergies  Allergen Reactions  . Mefoxin [Cefoxitin Sodium In Dextrose]     rash  . Demerol   . Topamax   . Avelox [Moxifloxacin Hcl In Nacl]     Hypotension.    Past Medical History, Surgical history, Social history, and Family History were reviewed and updated.  Review of Systems: As above  Physical Exam:  weight is 163 lb (73.936 kg). Her oral temperature is 98.3 F (36.8 C). Her blood pressure is 133/77 and her pulse is 89. Her respiration is 18.   Well-developed and well-nourished white female. Head and neck exam shows no ocular or oral. There are no palpable cervical or supraclavicular lymph nodes. Lungs are clear. Cardiac exam regular rate and rhythm with no murmurs  rubs or bruits. Abdomen is soft. Has good bowel sounds. There is no fluid wave. There is no palpable liver or spleen tip. Back exam shows a thoracotomy scar in the right lateral chest wall. This is well-healed. No tenderness is noted over the back. Extremities shows no clubbing cyanosis or edema. Skin exam no rashes.  Lab Results  Component Value Date   WBC 8.4 01/13/2014   HGB 12.2 01/13/2014   HCT 37.2 01/13/2014   MCV 86 01/13/2014   PLT 229 01/13/2014     Chemistry      Component Value Date/Time   NA 143 01/13/2014 1016   NA 143 10/29/2013 1125   K 3.7 01/13/2014 1016   K 3.0* 10/29/2013 1125   CL 106  01/13/2014 1016   CL 114* 10/29/2013 1125   CO2 26 01/13/2014 1016   CO2 20 10/29/2013 1125   BUN 16 01/13/2014 1016   BUN 13 10/29/2013 1125   CREATININE 0.7 01/13/2014 1016   CREATININE 0.51 10/29/2013 1125      Component Value Date/Time   CALCIUM 9.0 01/13/2014 1016   CALCIUM 8.1* 10/29/2013 1125   ALKPHOS 69 01/13/2014 1016   ALKPHOS 52 10/29/2013 1125   AST 20 01/13/2014 1016   AST 12 10/29/2013 1125   ALT 20 01/13/2014 1016   ALT 12 10/29/2013 1125   BILITOT 0.50 01/13/2014 1016   BILITOT 0.2 10/29/2013 1125         Impression and Plan: [name] is 63 year old white female with history of recurrent adenocarcinoma of the lung. She's had local recurrence. She was treated with radiation and chemotherapy for that recurrence. It probably now has been 3 years since she had recurrence.  she finished her treatments back in April 2012.  She still is at risk for recurrence.  I spent about 45 minutes talking with her today talking about all of her family issues. The time spent with her to try to listen to her and to hear what type of problems she is trying to deal with is very important. Thankfully, her husband has been very very good to her and trying to help as much as he can.  I will plan to see her back in another 2 months. I don't think we had to do any scans on her problem until the springtime.  She has a Port-A-Cath is still in. We will have this flushed in 8 weeks and then when we see her back.  It has now been 10 years since I first saw her. She has done incredibly well.  Kara Napoleon, MD 12/7/201511:41 AM

## 2014-01-13 NOTE — Patient Instructions (Signed)

## 2014-03-10 ENCOUNTER — Other Ambulatory Visit: Payer: Medicare Other | Admitting: Lab

## 2014-03-10 ENCOUNTER — Telehealth: Payer: Self-pay | Admitting: Hematology & Oncology

## 2014-03-10 ENCOUNTER — Ambulatory Visit: Payer: Medicare Other | Admitting: Family

## 2014-03-10 NOTE — Telephone Encounter (Signed)
Patient called and cx 03/10/14 due to being sick.  She resch for 03/17/14

## 2014-03-15 ENCOUNTER — Other Ambulatory Visit: Payer: Self-pay | Admitting: Hematology & Oncology

## 2014-03-17 ENCOUNTER — Other Ambulatory Visit (HOSPITAL_BASED_OUTPATIENT_CLINIC_OR_DEPARTMENT_OTHER): Payer: PPO | Admitting: Lab

## 2014-03-17 ENCOUNTER — Ambulatory Visit (HOSPITAL_BASED_OUTPATIENT_CLINIC_OR_DEPARTMENT_OTHER): Payer: PPO

## 2014-03-17 ENCOUNTER — Ambulatory Visit (HOSPITAL_BASED_OUTPATIENT_CLINIC_OR_DEPARTMENT_OTHER): Payer: PPO | Admitting: Family

## 2014-03-17 VITALS — BP 120/87 | HR 87 | Temp 97.9°F | Resp 16 | Wt 155.0 lb

## 2014-03-17 DIAGNOSIS — Z85118 Personal history of other malignant neoplasm of bronchus and lung: Secondary | ICD-10-CM

## 2014-03-17 DIAGNOSIS — C3400 Malignant neoplasm of unspecified main bronchus: Secondary | ICD-10-CM

## 2014-03-17 DIAGNOSIS — C3411 Malignant neoplasm of upper lobe, right bronchus or lung: Secondary | ICD-10-CM

## 2014-03-17 DIAGNOSIS — C801 Malignant (primary) neoplasm, unspecified: Secondary | ICD-10-CM

## 2014-03-17 DIAGNOSIS — Z452 Encounter for adjustment and management of vascular access device: Secondary | ICD-10-CM

## 2014-03-17 LAB — CBC WITH DIFFERENTIAL (CANCER CENTER ONLY)
BASO#: 0.1 10*3/uL (ref 0.0–0.2)
BASO%: 1 % (ref 0.0–2.0)
EOS%: 2 % (ref 0.0–7.0)
Eosinophils Absolute: 0.1 10*3/uL (ref 0.0–0.5)
HCT: 38.1 % (ref 34.8–46.6)
HEMOGLOBIN: 12.4 g/dL (ref 11.6–15.9)
LYMPH#: 1.9 10*3/uL (ref 0.9–3.3)
LYMPH%: 27.7 % (ref 14.0–48.0)
MCH: 27.3 pg (ref 26.0–34.0)
MCHC: 32.5 g/dL (ref 32.0–36.0)
MCV: 84 fL (ref 81–101)
MONO#: 0.5 10*3/uL (ref 0.1–0.9)
MONO%: 7 % (ref 0.0–13.0)
NEUT%: 62.3 % (ref 39.6–80.0)
NEUTROS ABS: 4.3 10*3/uL (ref 1.5–6.5)
PLATELETS: 244 10*3/uL (ref 145–400)
RBC: 4.54 10*6/uL (ref 3.70–5.32)
RDW: 14.9 % (ref 11.1–15.7)
WBC: 6.9 10*3/uL (ref 3.9–10.0)

## 2014-03-17 LAB — CMP (CANCER CENTER ONLY)
ALBUMIN: 3.9 g/dL (ref 3.3–5.5)
ALT(SGPT): 15 U/L (ref 10–47)
AST: 22 U/L (ref 11–38)
Alkaline Phosphatase: 49 U/L (ref 26–84)
BUN, Bld: 9 mg/dL (ref 7–22)
CO2: 25 meq/L (ref 18–33)
Calcium: 8.8 mg/dL (ref 8.0–10.3)
Chloride: 98 mEq/L (ref 98–108)
Creat: 0.6 mg/dl (ref 0.6–1.2)
Glucose, Bld: 89 mg/dL (ref 73–118)
POTASSIUM: 3.8 meq/L (ref 3.3–4.7)
SODIUM: 142 meq/L (ref 128–145)
TOTAL PROTEIN: 7 g/dL (ref 6.4–8.1)
Total Bilirubin: 0.5 mg/dl (ref 0.20–1.60)

## 2014-03-17 MED ORDER — HEPARIN SOD (PORK) LOCK FLUSH 100 UNIT/ML IV SOLN
500.0000 [IU] | Freq: Once | INTRAVENOUS | Status: AC | PRN
  Administered 2014-03-17: 500 [IU] via INTRAVENOUS
  Filled 2014-03-17: qty 5

## 2014-03-17 MED ORDER — SODIUM CHLORIDE 0.9 % IJ SOLN
10.0000 mL | INTRAMUSCULAR | Status: DC | PRN
  Filled 2014-03-17: qty 10

## 2014-03-17 MED ORDER — SODIUM CHLORIDE 0.9 % IJ SOLN
10.0000 mL | INTRAMUSCULAR | Status: DC | PRN
  Administered 2014-03-17: 10 mL via INTRAVENOUS
  Filled 2014-03-17: qty 10

## 2014-03-17 MED ORDER — HEPARIN SOD (PORK) LOCK FLUSH 100 UNIT/ML IV SOLN
500.0000 [IU] | Freq: Once | INTRAVENOUS | Status: DC
  Filled 2014-03-17: qty 5

## 2014-03-17 NOTE — Patient Instructions (Signed)

## 2014-03-17 NOTE — Progress Notes (Signed)
[place]  [phone] [phone]  ID: [name] OB: [birth year] MR#: [id number] [id number] Patient Care Team: Myrlene Broker, MD as PCP - General (Family Medicine)  DIAGNOSIS: Locally recurrent adenocarcinoma of the lung-clinical remission Post thoracotomy chest wall syndrome on the right Hiatal hernia-progressively symptomatic  INTERVAL HISTORY: [name] is here today for a follow-up. She is doing quite well. She has had no issues since her last visit and has no complaints today.  She denies fever, chills, n/v, cough, rash, headache, dizziness, SOB, chest pain, palpitations, abdominal pain, constipation, diarrhea, blood in urine or stool. No bleeding.  No swelling, tenderness, numbness, or tingling in her extremities. No new aches or pains.  Her appetite is good and she is staying hydrated. She has been watching what she eats and is losing some weight. She is stable at 155 lbs today.   CURRENT TREATMENT: Observation  REVIEW OF SYSTEMS: All other 10 point review of systems is negative.   PAST MEDICAL HISTORY: Past Medical History  Diagnosis Date  . Cancer 2005    Lung    PAST SURGICAL HISTORY: No past surgical history on file.  FAMILY HISTORY No family history on file.  GYNECOLOGIC HISTORY:  No LMP recorded. Patient has had a hysterectomy.   SOCIAL HISTORY: History   Social History  . Marital Status: Married    Spouse Name: N/A    Number of Children: N/A  . Years of Education: N/A   Occupational History  . Not on file.   Social History Main Topics  . Smoking status: Former Smoker -- 2.00 packs/day for 33 years    Types: Cigarettes    Start date: 07/19/1967    Quit date: 01/23/2003  . Smokeless tobacco: Never Used     Comment: quit smoking 11 years ago  . Alcohol Use: Not on file  . Drug Use: Not on file  . Sexual Activity: Not on file   Other Topics Concern  . Not on file   Social History Narrative     ADVANCED DIRECTIVES:  <no information>  HEALTH MAINTENANCE: History  Substance Use Topics  . Smoking status: Former Smoker -- 2.00 packs/day for 33 years    Types: Cigarettes    Start date: 07/19/1967    Quit date: 01/23/2003  . Smokeless tobacco: Never Used     Comment: quit smoking 11 years ago  . Alcohol Use: Not on file   Colonoscopy: PAP: Bone density: Lipid panel:  Allergies  Allergen Reactions  . Mefoxin [Cefoxitin Sodium In Dextrose]     rash  . Demerol   . Topamax   . Avelox [Moxifloxacin Hcl In Nacl]     Hypotension.    Current Outpatient Prescriptions  Medication Sig Dispense Refill  . aspirin 81 MG tablet Take 81 mg by mouth daily.      . calcium-vitamin D (OSCAL) 250-125 MG-UNIT per tablet Take 1 tablet by mouth daily.      . Cholecalciferol (VITAMIN D) 2000 UNITS tablet Take 2,000 Units by mouth daily.    . Doxepin HCl (SILENOR) 6 MG TABS Take 1 tablet (6 mg total) by mouth at bedtime as needed. 30 tablet 4  . fish oil-omega-3 fatty acids 1000 MG capsule Take 2 g by mouth daily.      Marland Kitchen KLOR-CON M20 20 MEQ tablet TAKE 1 TABLET (20 MEQ TOTAL) BY MOUTH DAILY. 30 tablet 3  . lidocaine-prilocaine (EMLA) cream Apply 1 application topically as needed.      Marland Kitchen  Lorcaserin HCl (BELVIQ) 10 MG TABS Take 10 mg by mouth daily. 30 tablet 0  . losartan (COZAAR) 50 MG tablet Take 50 mg by mouth daily.     Marland Kitchen omeprazole (PRILOSEC) 40 MG capsule Take 40 mg by mouth daily.  3  . PROVENTIL HFA 108 (90 BASE) MCG/ACT inhaler as needed.     . SUMAtriptan (IMITREX) 100 MG tablet Take 100 mg by mouth as needed. For H/A     Current Facility-Administered Medications  Medication Dose Route Frequency Provider Last Rate Last Dose  . heparin lock flush 100 unit/mL  500 Units Intravenous Once Volanda Napoleon, MD      . sodium chloride 0.9 % injection 10 mL  10 mL Intravenous PRN Volanda Napoleon, MD       Facility-Administered Medications Ordered in Other Visits  Medication Dose  Route Frequency Provider Last Rate Last Dose  . sodium chloride 0.9 % injection 10 mL  10 mL Intravenous PRN Golden Pop, FNP   10 mL at 12/06/13 1149    OBJECTIVE: Filed Vitals:   03/17/14 1127  BP: 120/87  Pulse: 87  Temp: 97.9 F (36.6 C)  Resp: 16    Filed Weights   03/17/14 1127  Weight: 155 lb (70.308 kg)   ECOG FS:0 - Asymptomatic Ocular: Sclerae unicteric, pupils equal, round and reactive to light Ear-nose-throat: Oropharynx clear, dentition fair Lymphatic: No cervical or supraclavicular adenopathy Lungs no rales or rhonchi, good excursion bilaterally Heart regular rate and rhythm, no murmur appreciated Abd soft, nontender, positive bowel sounds MSK no focal spinal tenderness, no joint edema Neuro: non-focal, well-oriented, appropriate affect Breasts: Deferred  LAB RESULTS: CMP     Component Value Date/Time   NA 143 01/13/2014 1016   NA 143 10/29/2013 1125   K 3.7 01/13/2014 1016   K 3.0* 10/29/2013 1125   CL 106 01/13/2014 1016   CL 114* 10/29/2013 1125   CO2 26 01/13/2014 1016   CO2 20 10/29/2013 1125   GLUCOSE 104 01/13/2014 1016   GLUCOSE 110* 10/29/2013 1125   BUN 16 01/13/2014 1016   BUN 13 10/29/2013 1125   CREATININE 0.7 01/13/2014 1016   CREATININE 0.51 10/29/2013 1125   CALCIUM 9.0 01/13/2014 1016   CALCIUM 8.1* 10/29/2013 1125   PROT 7.6 01/13/2014 1016   PROT 5.6* 10/29/2013 1125   ALBUMIN 3.5 10/29/2013 1125   AST 20 01/13/2014 1016   AST 12 10/29/2013 1125   ALT 20 01/13/2014 1016   ALT 12 10/29/2013 1125   ALKPHOS 69 01/13/2014 1016   ALKPHOS 52 10/29/2013 1125   BILITOT 0.50 01/13/2014 1016   BILITOT 0.2 10/29/2013 1125   GFRNONAA >60 11/16/2009 0955   GFRAA  11/16/2009 0955    >60        The eGFR has been calculated using the MDRD equation. This calculation has not been validated in all clinical situations. eGFR's persistently <60 mL/min signify possible Chronic Kidney Disease.   INo results found for: SPEP, UPEP Lab  Results  Component Value Date   WBC 6.9 03/17/2014   NEUTROABS 4.3 03/17/2014   HGB 12.4 03/17/2014   HCT 38.1 03/17/2014   MCV 84 03/17/2014   PLT 244 03/17/2014   No results found for: LABCA2 No components found for: PVXYI016 No results for input(s): INR in the last 168 hours.  STUDIES: No results found.  ASSESSMENT/PLAN: [name] is 64 year old white female with history of local recurrent adenocarcinoma of the lung. She was then treated  with radiation and chemotherapy. She finished her treatments in April 2012 and is still at risk for recurrence. She is doing well and has shown no evidence of recurrence. She is asymptomatic at this time.  Her CBC today was normal.  She will continue to have her port flushed with each visit.  We will see her back in 3 months for labs and follow-up.  She knows to call here with any questions or concerns and to go to the ED in the event of an emergency. We can certainly see her sooner if need be.   Eliezer Bottom, NP 03/17/2014 12:04 PM

## 2014-03-18 LAB — VITAMIN D 25 HYDROXY (VIT D DEFICIENCY, FRACTURES): VIT D 25 HYDROXY: 57 ng/mL (ref 30–100)

## 2014-04-19 IMAGING — RF DG UGI W/ KUB
14 of 24 series · 14 of 24 positions shown · non-contrast
Comparison: Chest CT dated 01/20/2012.

CLINICAL DATA: Right upper abdominal pain.  Gastroesophageal reflux
symptoms with heartburn.  Previous hiatal hernia repair.

UPPER GI SERIES W/ KUB
TECHNIQUE: After obtaining a scout radiograph a double contrast
upper GI series was performed using thin and thick barium.
Fluoroscopy Time: 2 minutes and 15 seconds.

[Series 1: run · 1 of 1 slices shown (1 of 14)]
[im 1/1]
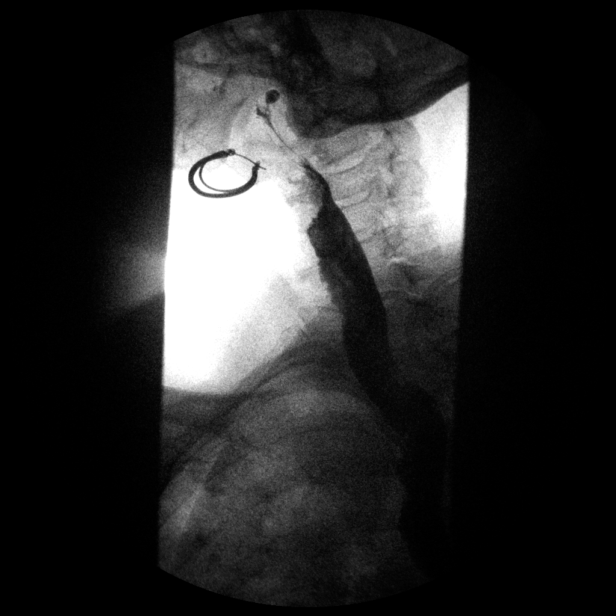

[Series 3: run · 1 of 1 slices shown (2 of 14)]
[im 1/1]
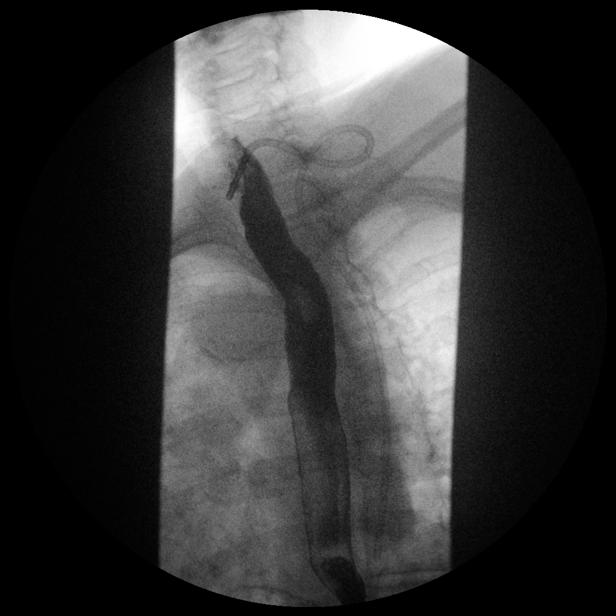

[Series 5: run · 1 of 1 slices shown (3 of 14)]
[im 1/1]
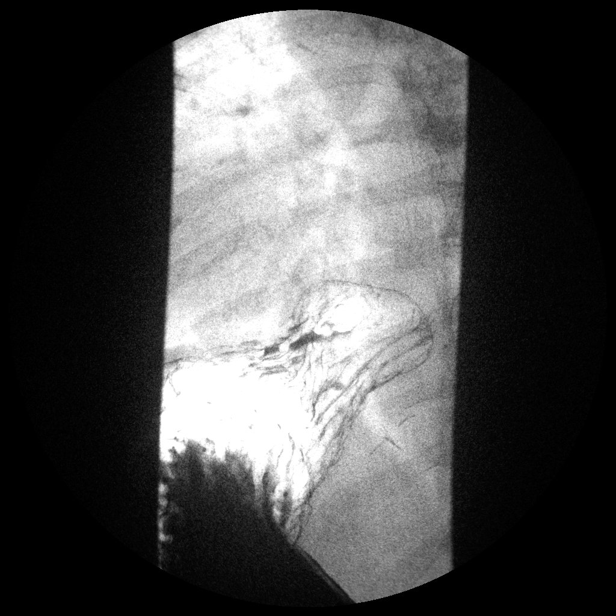

[Series 7: run · 1 of 1 slices shown (4 of 14)]
[im 1/1]
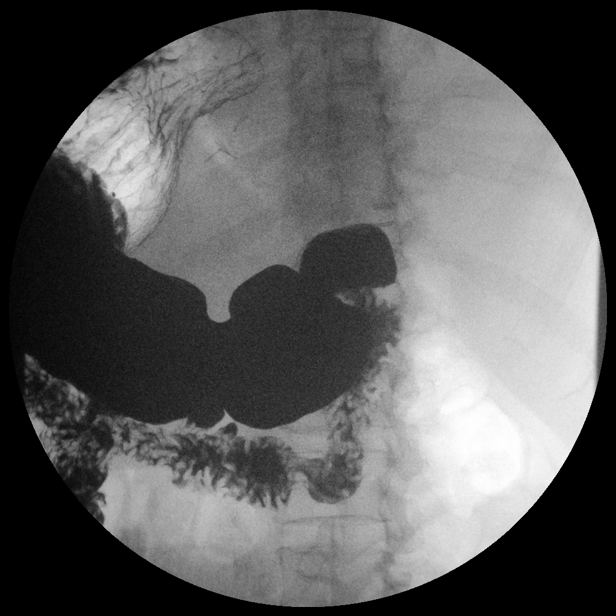

[Series 8: run · 1 of 1 slices shown (5 of 14)]
[im 1/1]
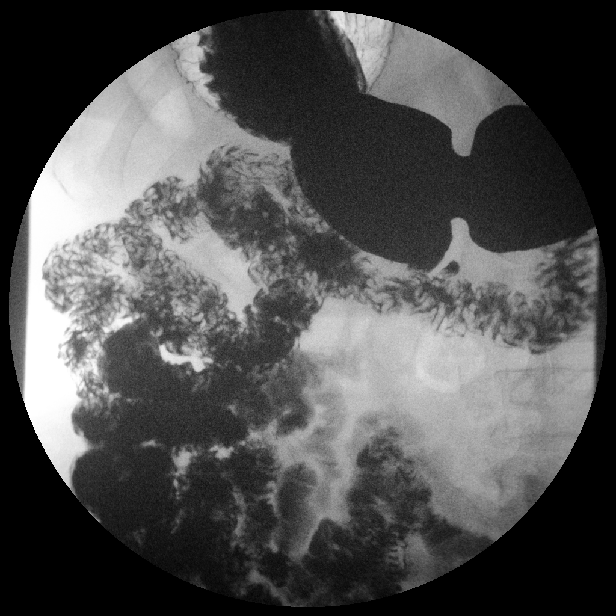

[Series 10: run · 1 of 1 slices shown (6 of 14)]
[im 1/1]
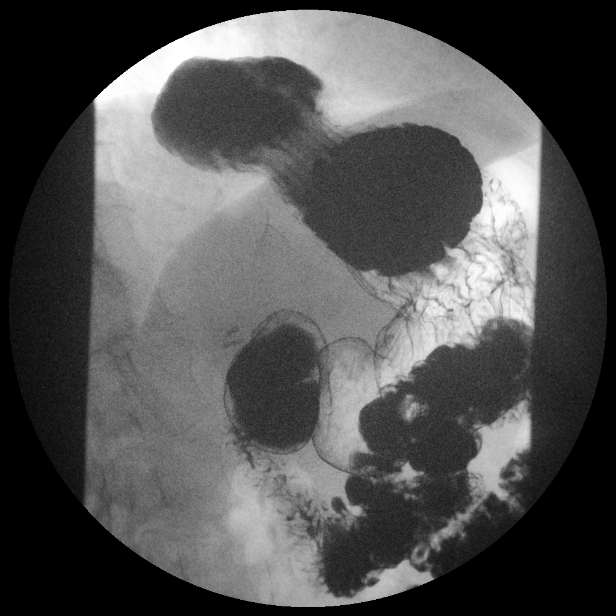

[Series 12: run · 1 of 1 slices shown (7 of 14)]
[im 1/1]
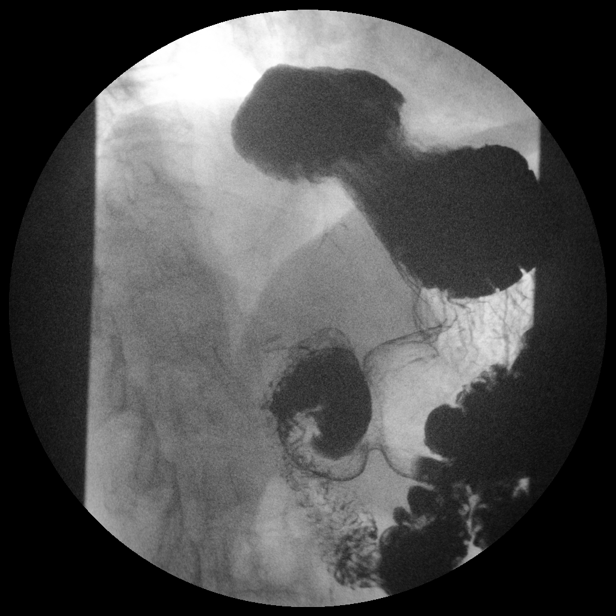

[Series 13: run · 1 of 1 slices shown (8 of 14)]
[im 1/1]
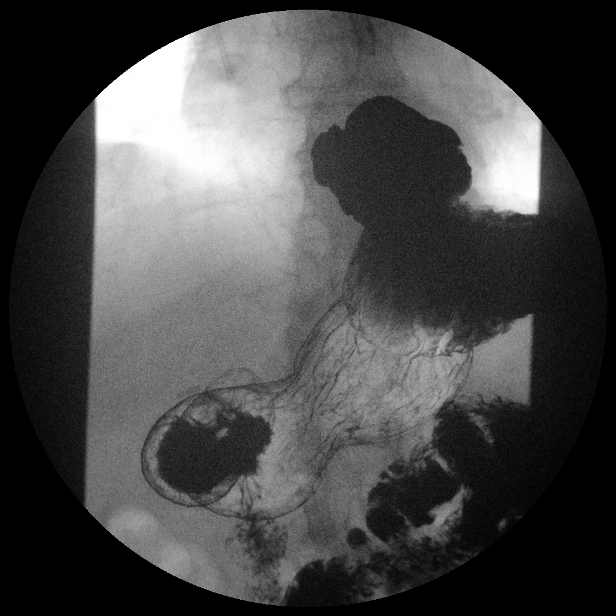

[Series 15: run · 1 of 1 slices shown (9 of 14)]
[im 1/1]
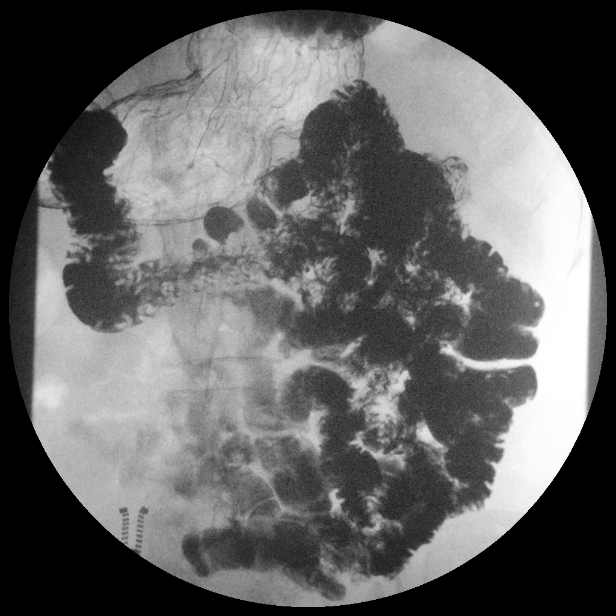

[Series 17: run · 1 of 1 slices shown (10 of 14)]
[im 1/1]
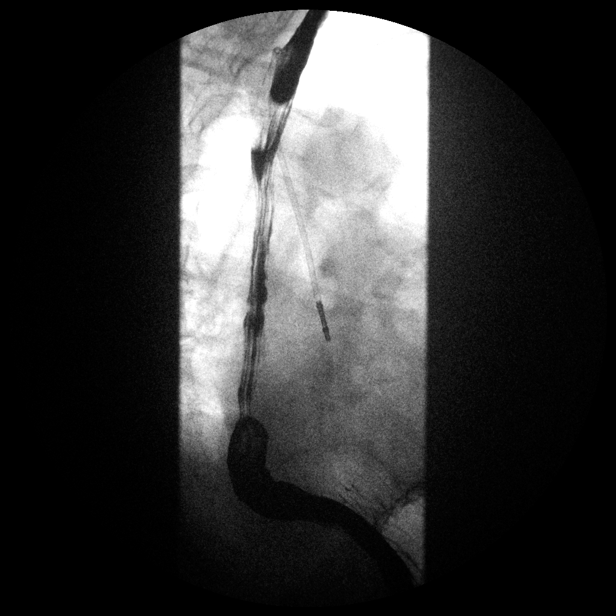

[Series 19: run · 1 of 1 slices shown (11 of 14)]
[im 1/1]
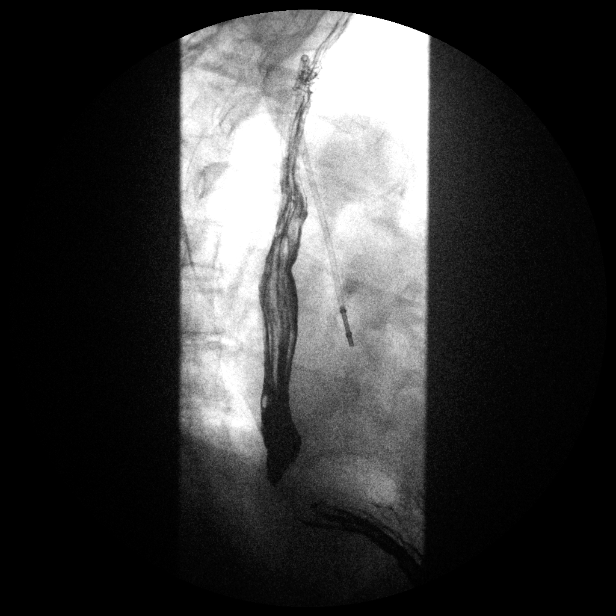

[Series 20: run · 1 of 1 slices shown (12 of 14)]
[im 1/1]
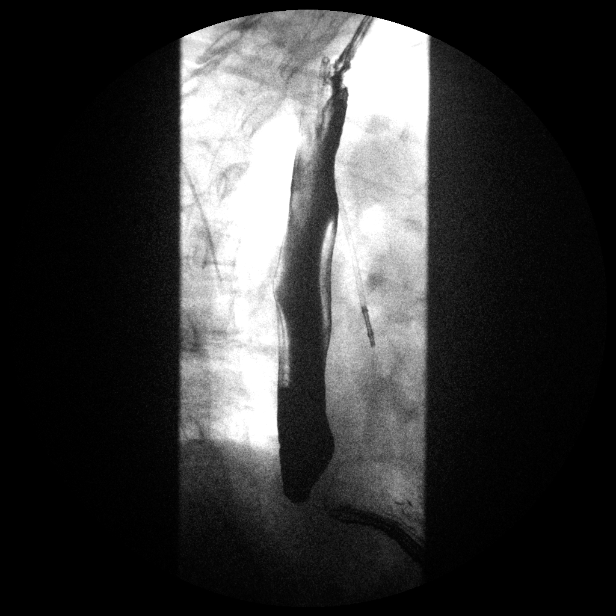

[Series 22: run · 1 of 1 slices shown (13 of 14)]
[im 1/1]
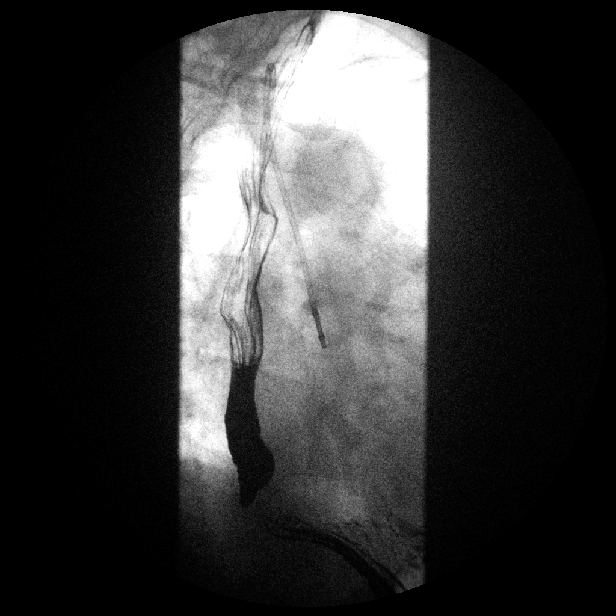

[Series 24: run · 1 of 1 slices shown (14 of 14)]
[im 1/1]
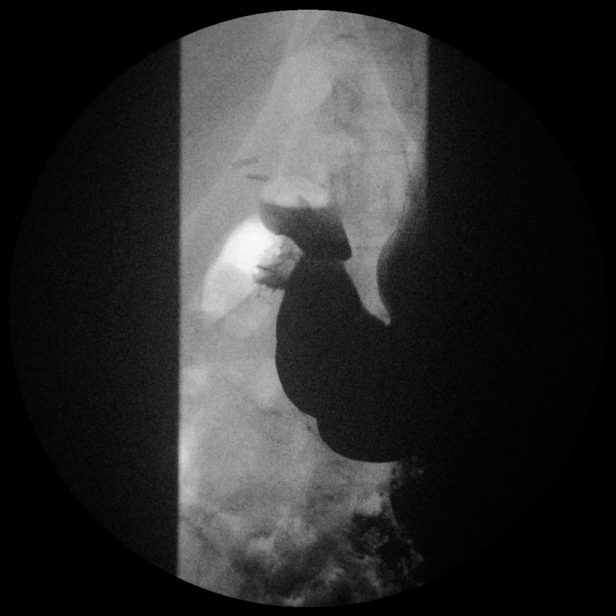

[14 of 24 positions shown; findings below may reference images not displayed]

FINDINGS: The preliminary supine radiograph of the abdomen
demonstrates a normal bowel gas pattern and cholecystectomy clips.
There are also surgical clips in the epigastric region.  Mild
thoracolumbar scoliosis and degenerative changes.

The patient swallowed barium without difficulty.  Normal esophageal
motility with no hypopharyngeal abnormalities seen.  Moderate-sized
sliding hiatal hernia.  Two small diverticula arising from the
third and fourth portions of the duodenum.

No gastroesophageal reflux, mass, stricture or ulceration seen.
The patient swallowed a 12.5 mm in diameter barium tablet without
difficulty.  This passed normally through the esophagus and into
the stomach.
IMPRESSION: Moderate sized hiatal hernia.  Otherwise,
unremarkable examination.

## 2014-04-24 ENCOUNTER — Encounter: Payer: Self-pay | Admitting: Nurse Practitioner

## 2014-06-16 ENCOUNTER — Ambulatory Visit (HOSPITAL_BASED_OUTPATIENT_CLINIC_OR_DEPARTMENT_OTHER): Payer: PPO | Admitting: Hematology & Oncology

## 2014-06-16 ENCOUNTER — Ambulatory Visit (HOSPITAL_BASED_OUTPATIENT_CLINIC_OR_DEPARTMENT_OTHER)
Admission: RE | Admit: 2014-06-16 | Discharge: 2014-06-16 | Disposition: A | Discharge status: Home / Self Care | Payer: PPO | Source: Ambulatory Visit | Attending: Hematology & Oncology | Admitting: Hematology & Oncology

## 2014-06-16 ENCOUNTER — Encounter: Payer: Self-pay | Admitting: Hematology & Oncology

## 2014-06-16 ENCOUNTER — Encounter (HOSPITAL_BASED_OUTPATIENT_CLINIC_OR_DEPARTMENT_OTHER): Payer: Self-pay

## 2014-06-16 ENCOUNTER — Other Ambulatory Visit: Payer: Self-pay | Admitting: Hematology & Oncology

## 2014-06-16 ENCOUNTER — Other Ambulatory Visit (HOSPITAL_BASED_OUTPATIENT_CLINIC_OR_DEPARTMENT_OTHER): Payer: PPO

## 2014-06-16 ENCOUNTER — Ambulatory Visit (HOSPITAL_BASED_OUTPATIENT_CLINIC_OR_DEPARTMENT_OTHER): Payer: PPO

## 2014-06-16 VITALS — BP 123/86 | HR 89 | Temp 98.6°F | Resp 14 | Ht 62.0 in | Wt 147.0 lb

## 2014-06-16 DIAGNOSIS — C3412 Malignant neoplasm of upper lobe, left bronchus or lung: Secondary | ICD-10-CM

## 2014-06-16 DIAGNOSIS — K449 Diaphragmatic hernia without obstruction or gangrene: Secondary | ICD-10-CM | POA: Insufficient documentation

## 2014-06-16 DIAGNOSIS — K219 Gastro-esophageal reflux disease without esophagitis: Secondary | ICD-10-CM

## 2014-06-16 DIAGNOSIS — R062 Wheezing: Secondary | ICD-10-CM | POA: Diagnosis present

## 2014-06-16 DIAGNOSIS — Z85118 Personal history of other malignant neoplasm of bronchus and lung: Secondary | ICD-10-CM

## 2014-06-16 DIAGNOSIS — K429 Umbilical hernia without obstruction or gangrene: Secondary | ICD-10-CM | POA: Insufficient documentation

## 2014-06-16 DIAGNOSIS — R0602 Shortness of breath: Secondary | ICD-10-CM | POA: Diagnosis not present

## 2014-06-16 DIAGNOSIS — C3411 Malignant neoplasm of upper lobe, right bronchus or lung: Secondary | ICD-10-CM

## 2014-06-16 DIAGNOSIS — R634 Abnormal weight loss: Secondary | ICD-10-CM | POA: Diagnosis not present

## 2014-06-16 DIAGNOSIS — K4020 Bilateral inguinal hernia, without obstruction or gangrene, not specified as recurrent: Secondary | ICD-10-CM | POA: Insufficient documentation

## 2014-06-16 DIAGNOSIS — R109 Unspecified abdominal pain: Secondary | ICD-10-CM | POA: Diagnosis not present

## 2014-06-16 DIAGNOSIS — C801 Malignant (primary) neoplasm, unspecified: Secondary | ICD-10-CM

## 2014-06-16 HISTORY — DX: Essential (primary) hypertension: I10

## 2014-06-16 LAB — CBC WITH DIFFERENTIAL (CANCER CENTER ONLY)
BASO#: 0.1 10*3/uL (ref 0.0–0.2)
BASO%: 0.5 % (ref 0.0–2.0)
EOS ABS: 0.1 10*3/uL (ref 0.0–0.5)
EOS%: 0.5 % (ref 0.0–7.0)
HCT: 38.1 % (ref 34.8–46.6)
HGB: 12.7 g/dL (ref 11.6–15.9)
LYMPH#: 2 10*3/uL (ref 0.9–3.3)
LYMPH%: 22 % (ref 14.0–48.0)
MCH: 28.5 pg (ref 26.0–34.0)
MCHC: 33.3 g/dL (ref 32.0–36.0)
MCV: 86 fL (ref 81–101)
MONO#: 0.6 10*3/uL (ref 0.1–0.9)
MONO%: 7 % (ref 0.0–13.0)
NEUT%: 70 % (ref 39.6–80.0)
NEUTROS ABS: 6.4 10*3/uL (ref 1.5–6.5)
PLATELETS: 214 10*3/uL (ref 145–400)
RBC: 4.45 10*6/uL (ref 3.70–5.32)
RDW: 16.2 % — ABNORMAL HIGH (ref 11.1–15.7)
WBC: 9.1 10*3/uL (ref 3.9–10.0)

## 2014-06-16 MED ORDER — ALTEPLASE 2 MG IJ SOLR
2.0000 mg | Freq: Once | INTRAMUSCULAR | Status: DC | PRN
  Filled 2014-06-16: qty 2

## 2014-06-16 MED ORDER — IOHEXOL 350 MG/ML SOLN
100.0000 mL | Freq: Once | INTRAVENOUS | Status: AC | PRN
  Administered 2014-06-16: 100 mL via INTRAVENOUS

## 2014-06-16 MED ORDER — IOHEXOL 350 MG/ML SOLN: 100.0000 mL | Freq: Once | INTRAVENOUS | Status: DC | PRN

## 2014-06-16 MED ORDER — SODIUM CHLORIDE 0.9 % IJ SOLN
10.0000 mL | INTRAMUSCULAR | Status: DC | PRN
  Administered 2014-06-16: 10 mL via INTRAVENOUS
  Filled 2014-06-16: qty 10

## 2014-06-16 MED ORDER — HEPARIN SOD (PORK) LOCK FLUSH 100 UNIT/ML IV SOLN
500.0000 [IU] | Freq: Once | INTRAVENOUS | Status: AC | PRN
  Administered 2014-06-16: 500 [IU] via INTRAVENOUS
  Filled 2014-06-16: qty 5

## 2014-06-16 NOTE — Progress Notes (Signed)
Hematology and Oncology Follow Up Visit  KATHALINA OSTERMANN [id number] Nov 17, 1950 64 y.o. 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  Guggenheim is back for followup. She still has a lot of stress now. There are still a lot of family problems that she is try to deal with.   She is complaining of some shortness of breath. She's wheezing. She's had no chest wall pain. She has had some upper abdominal pain. She does have a hiatal hernia.  We will have to go ahead and get a CT scan today. She is at risk for recurrence since she's had several recurrences already. I also want to make sure that she does not have any kind of pulmonary embolism.  She is losing weight. This might be from all the stress that she is dealing with that is going on with her family  . She's had no leg swelling. She's had no rashes.there is been no problems with bowels or bladder. She's had no rashes.  Medications:  Current outpatient prescriptions:  .  aspirin 81 MG tablet, Take 81 mg by mouth daily.  , Disp: , Rfl:  .  calcium-vitamin D (OSCAL) 250-125 MG-UNIT per tablet, Take 1 tablet by mouth daily.  , Disp: , Rfl:  .  Cholecalciferol (VITAMIN D) 2000 UNITS tablet, Take 2,000 Units by mouth daily., Disp: , Rfl:  .  fish oil-omega-3 fatty acids 1000 MG capsule, Take 2 g by mouth daily.  , Disp: , Rfl:  .  KLOR-CON M20 20 MEQ tablet, TAKE 1 TABLET (20 MEQ TOTAL) BY MOUTH DAILY., Disp: 30 tablet, Rfl: 3 .  lidocaine-prilocaine (EMLA) cream, Apply 1 application topically as needed.  , Disp: , Rfl:  .  LORazepam (ATIVAN) 0.5 MG tablet, Take 0.5 mg by mouth every 8 (eight) hours as needed., Disp: , Rfl: 0 .  Lorcaserin HCl (BELVIQ) 10 MG TABS, Take 10 mg by mouth daily., Disp: 30 tablet, Rfl: 0 .  losartan (COZAAR) 50  MG tablet, Take 50 mg by mouth daily. , Disp: , Rfl:  .  meloxicam (MOBIC) 15 MG tablet, Take 15 mg by mouth daily., Disp: , Rfl: 0 .  omeprazole (PRILOSEC) 40 MG capsule, Take 40 mg by mouth daily., Disp: , Rfl: 3 .  PROVENTIL HFA 108 (90 BASE) MCG/ACT inhaler, as needed. , Disp: , Rfl:  .  SUMAtriptan (IMITREX) 100 MG tablet, Take 100 mg by mouth as needed. For H/A, Disp: , Rfl:  No current facility-administered medications for this visit.  Facility-Administered Medications Ordered in Other Visits:  .  sodium chloride 0.9 % injection 10 mL, 10 mL, Intravenous, PRN, Golden Pop, FNP, 10 mL at 12/06/13 1149  Allergies:  Allergies  Allergen Reactions  . Mefoxin [Cefoxitin Sodium In Dextrose]     rash  . Demerol   . Topamax   . Avelox [Moxifloxacin Hcl In Nacl]     Hypotension.    Past Medical History, Surgical history, Social history, and Family History were reviewed and updated.  Review of Systems: As above  Physical Exam:  height is '5\' 2"'$  (1.575 m) and weight is 147 lb (66.679 kg). Her oral temperature is 98.6 F (37 C). Her blood pressure is 123/86 and her pulse is 89. Her respiration is 14.   Well-developed and well-nourished  white female. Head and neck exam shows no ocular or oral. There are no palpable cervical or supraclavicular lymph nodes. Lungs are clear. Cardiac exam regular rate and rhythm with no murmurs rubs or bruits. Abdomen is soft. Has good bowel sounds. There is no fluid wave. There is no palpable liver or spleen tip. Back exam shows a thoracotomy scar in the right lateral chest wall. This is well-healed. No tenderness is noted over the back. Extremities shows no clubbing cyanosis or edema. Skin exam no rashes.  Lab Results  Component Value Date   WBC 9.1 06/16/2014   HGB 12.7 06/16/2014   HCT 38.1 06/16/2014   MCV 86 06/16/2014   PLT 214 06/16/2014     Chemistry      Component Value Date/Time   NA 137 06/16/2014 0934   NA 142 03/17/2014 1103   K 3.9  06/16/2014 0934   K 3.8 03/17/2014 1103   CL 104 06/16/2014 0934   CL 98 03/17/2014 1103   CO2 24 06/16/2014 0934   CO2 25 03/17/2014 1103   BUN 15 06/16/2014 0934   BUN 9 03/17/2014 1103   CREATININE 0.66 06/16/2014 0934   CREATININE 0.6 03/17/2014 1103      Component Value Date/Time   CALCIUM 9.1 06/16/2014 0934   CALCIUM 8.8 03/17/2014 1103   ALKPHOS 55 06/16/2014 0934   ALKPHOS 49 03/17/2014 1103   AST 15 06/16/2014 0934   AST 22 03/17/2014 1103   ALT 16 06/16/2014 0934   ALT 15 03/17/2014 1103   BILITOT 0.6 06/16/2014 0934   BILITOT 0.50 03/17/2014 1103         Impression and Plan: Ms. Steinke is 64 year old white female with history of recurrent adenocarcinoma of the lung. She's had local recurrence. She was treated with radiation and chemotherapy for that recurrence. It probably now has been 4 years since she had recurrence.  She finished her treatments back in April 2012.  She still is at risk for recurrence.  I spent about 45 minutes talking with her today talking about all of her family issues. The time spent with her to try to listen to her and to hear what type of problems she is trying to deal with is very important. Thankfully, her husband has been very very good to her and trying to help as much as he can.  I will plan to see her back in another 1 month. We will have to see what the scan shows. She'll  She has a Port-A-Cath is still in. We will have this flushed in 8 weeks and then when we see her back.  It has now been 10 years since I first saw her. She has done incredibly well.  Volanda Napoleon, MD 5/9/20166:22 PM

## 2014-06-17 LAB — COMPREHENSIVE METABOLIC PANEL
ALBUMIN: 4.1 g/dL (ref 3.5–5.2)
ALT: 16 U/L (ref 0–35)
AST: 15 U/L (ref 0–37)
Alkaline Phosphatase: 55 U/L (ref 39–117)
BUN: 15 mg/dL (ref 6–23)
CHLORIDE: 104 meq/L (ref 96–112)
CO2: 24 mEq/L (ref 19–32)
Calcium: 9.1 mg/dL (ref 8.4–10.5)
Creatinine, Ser: 0.66 mg/dL (ref 0.50–1.10)
Glucose, Bld: 83 mg/dL (ref 70–99)
Potassium: 3.9 mEq/L (ref 3.5–5.3)
SODIUM: 137 meq/L (ref 135–145)
Total Bilirubin: 0.6 mg/dL (ref 0.2–1.2)
Total Protein: 6.6 g/dL (ref 6.0–8.3)

## 2014-06-17 LAB — VITAMIN D 25 HYDROXY (VIT D DEFICIENCY, FRACTURES): Vit D, 25-Hydroxy: 48 ng/mL (ref 30–100)

## 2014-07-15 ENCOUNTER — Telehealth: Payer: Self-pay | Admitting: *Deleted

## 2014-07-15 NOTE — Telephone Encounter (Signed)
Patient wants to cancel tomorrow's appointment. She doesn't feel like she needs to be seen. Was recently seen by her PCP and she's not having issues. Spoke to Dr Marin Olp who is fine with her rescheduling 2 months out. Message sent to scheduler.

## 2014-07-16 ENCOUNTER — Ambulatory Visit: Payer: PPO | Admitting: Hematology & Oncology

## 2014-07-16 ENCOUNTER — Telehealth: Payer: Self-pay | Admitting: Hematology & Oncology

## 2014-07-16 NOTE — Telephone Encounter (Signed)
CONTACTED PT AND RESCHD FOR AUG 9

## 2014-07-19 IMAGING — CT CT CHEST W/ CM
3 of 4 series · 14 of 31 positions shown, 15 images · IV contrast (75CC OMNI 300)
Comparison: Multiple priors, most recently 01/20/2012.

CLINICAL DATA: Cough with hemoptysis.History of lung cancer.

CT CHEST WITH CONTRAST
TECHNIQUE: Multidetector CT imaging of the chest was performed
following the standard protocol during bolus administration of
intravenous contrast.
Contrast: 75mL OMNIPAQUE IOHEXOL 300 MG/ML  SOLN

[Series 3: chest with · axial · 0.90mm/px · z∈[-201,-71]mm · 3 of 54 slices shown, 4 images]
[im 14/54  mediastinal]
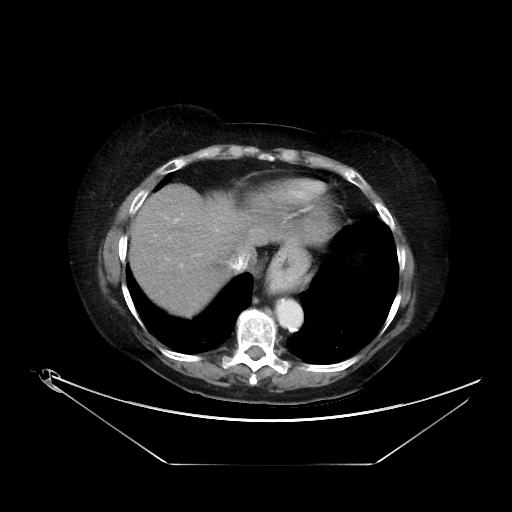
[im 14/54  lung]
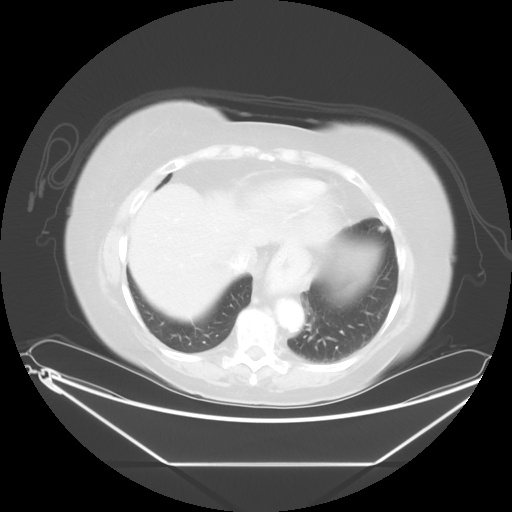
[im 27/54  lung]
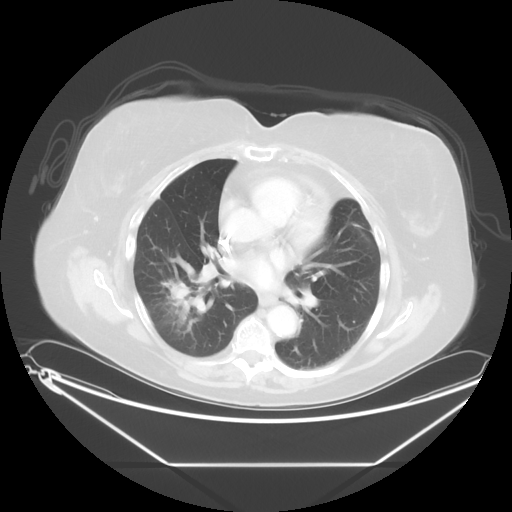
[im 40/54  lung]
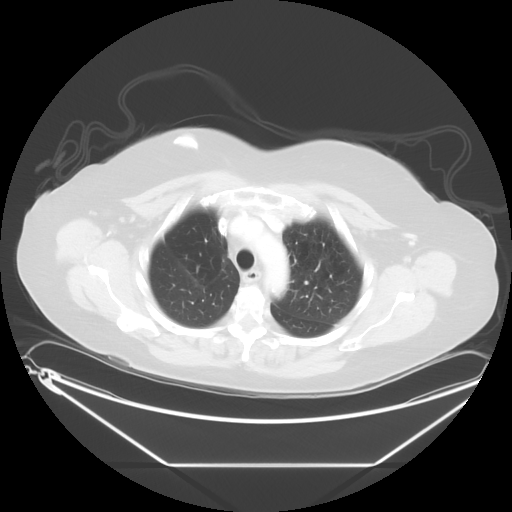

[Series 4: lung windows · axial · 0.90mm/px · z∈[-191,-66]mm · 3 of 51 slices shown]
[im 13/51  lung]
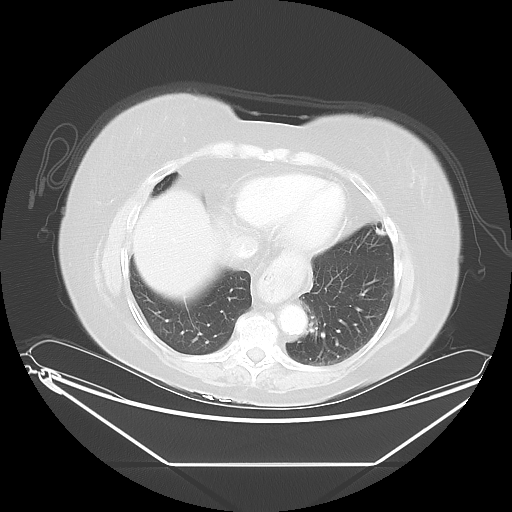
[im 26/51  lung]
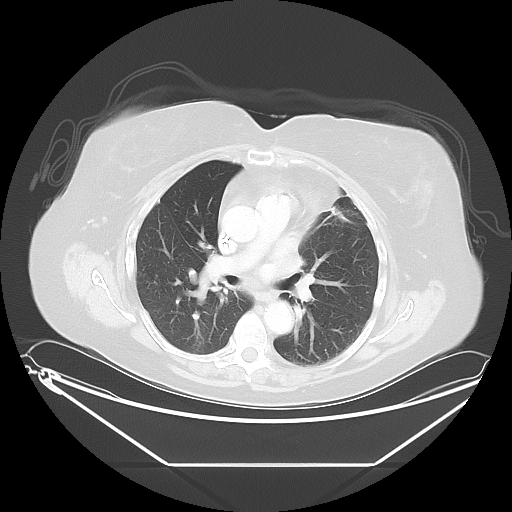
[im 38/51  lung]
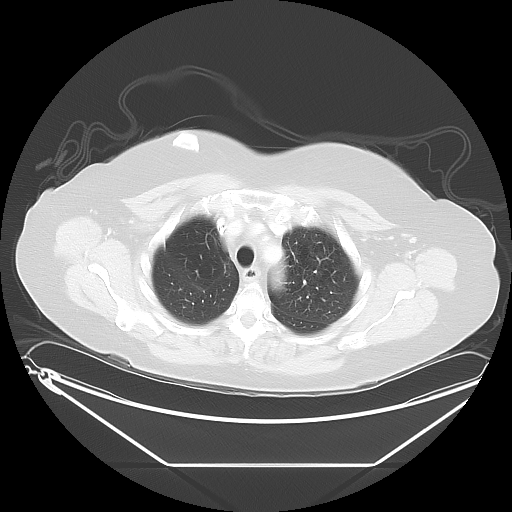

[Series 602: sagittal body · sagittal · 0.90mm/px · 8 of 186 slices shown]
[im 22/186  mediastinal]
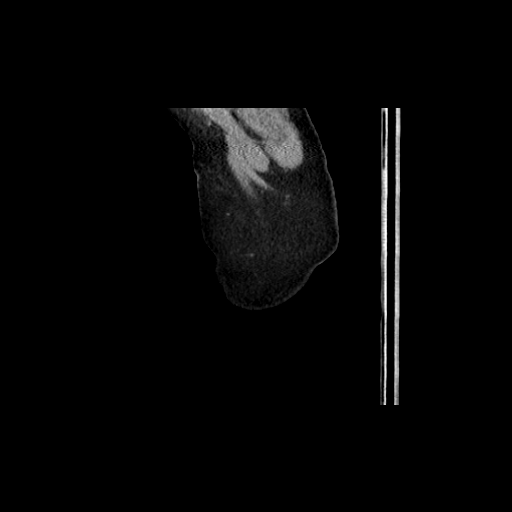
[im 44/186  mediastinal]
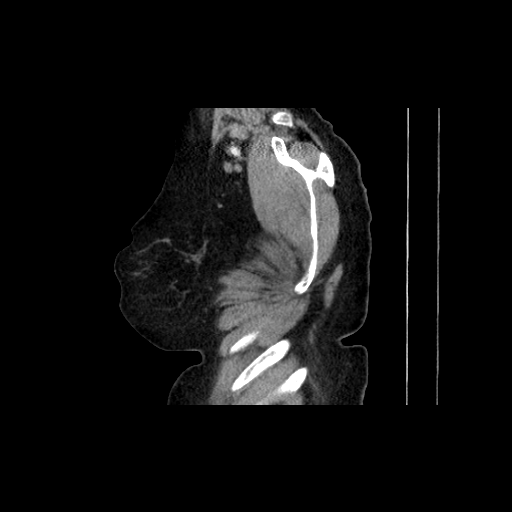
[im 66/186  mediastinal]
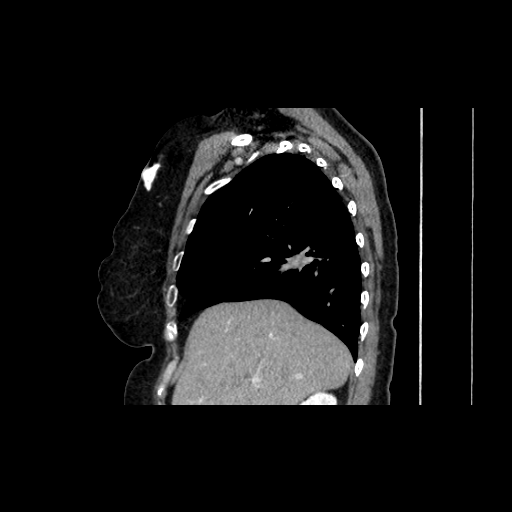
[im 88/186  mediastinal]
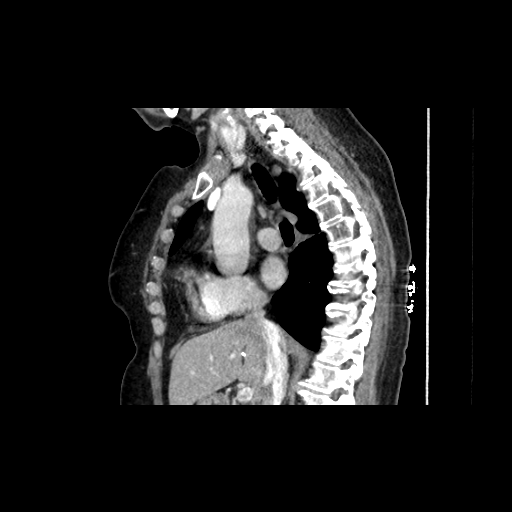
[im 98/186  mediastinal]
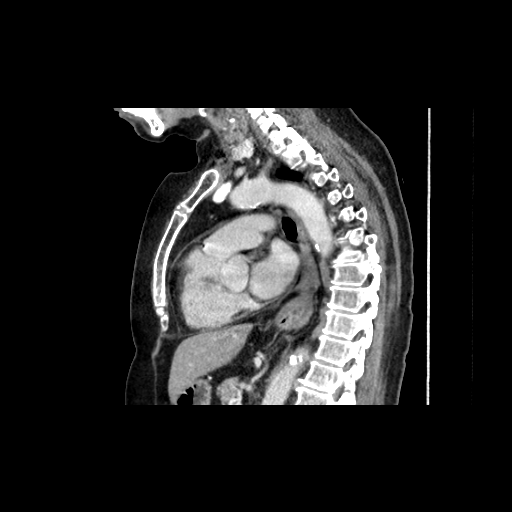
[im 120/186  mediastinal]
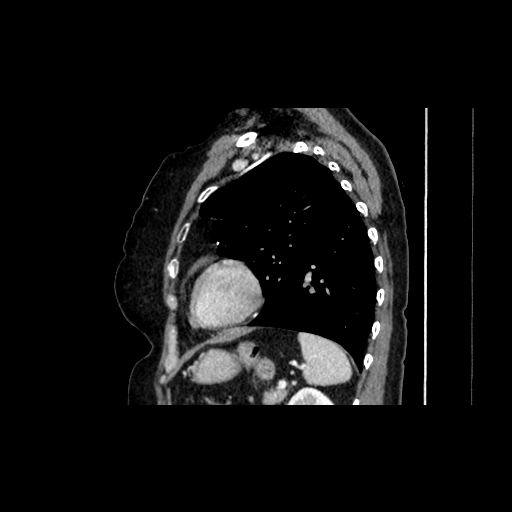
[im 142/186  mediastinal]
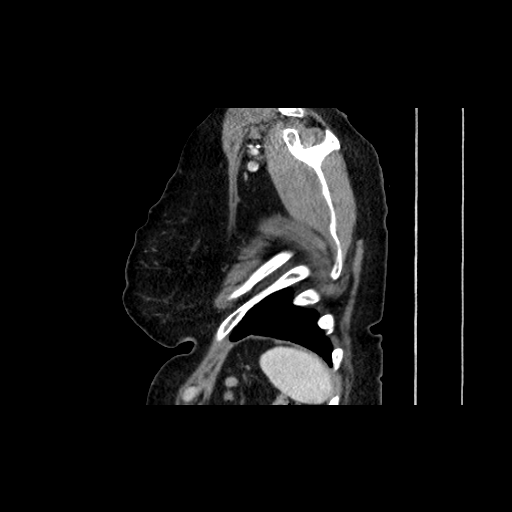
[im 164/186  mediastinal]
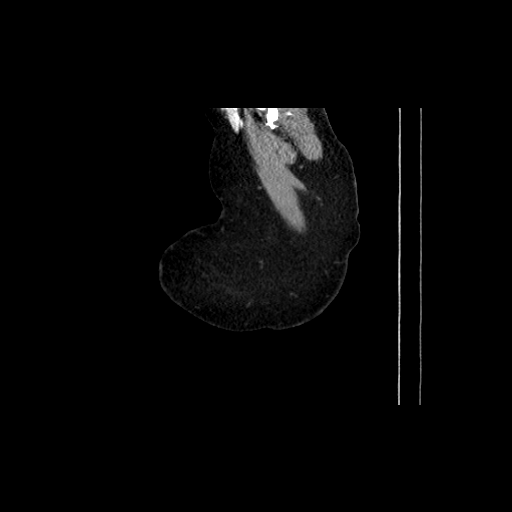

[14 of 31 positions shown; findings below may reference images not displayed]

FINDINGS: Mediastinum: Heart size is it is normal. There is no significant
pericardial fluid, thickening or pericardial calcification. No
pathologically enlarged mediastinal or hilar lymph nodes. Moderate
sized hiatal hernia.  Right internal jugular Port-A-Cath with tip
terminating in the right atrium.

Lungs/Pleura: Status post wedge resection in the medial aspect of
the left upper lobe.  Status post right upper lobectomy.  There is
a persistent area of mass-like architectural distortion centered in
the region of the basal segments of the right lower lobe which is
very similar in size and appearance to the prior study, most
compatible with an area of progressive mass-like fibrosis related
to prior radiation therapy.  In the periphery of the right lower
lobe on today's examination there is a new nodule that is pleural-
based measuring 5 mm (image 29 of series 4), which is highly
nonspecific.  There are also a few other tiny nodules in the right
lower lobe, including a 4 mm nodule (image 27 of series 4).  No
other larger more suspicious appearing pulmonary nodules or masses
are otherwise identified.  No acute consolidative airspace disease.
No pleural effusions.

Upper Abdomen: Status post cholecystectomy.

Musculoskeletal: Post thoracotomy changes redemonstrated. There are
no aggressive appearing lytic or blastic lesions noted in the
visualized portions of the skeleton.
IMPRESSION: 1.  Generally stable postoperative and post treatment related
changes in the lungs bilaterally, without definitive signs to
suggest local recurrence of disease or metastatic disease at this
time.
2.  There are two tiny (4 and 5 mm) nonspecific pulmonary nodules
which are new compared to the prior examination in the right lower
lobe.  These are favored to represent areas of mucoid impaction
within terminal bronchioles, however, attention on follow-up
studies is recommended.
3.  Moderate sized hiatal hernia.
4.  Status post cholecystectomy.

## 2014-08-13 ENCOUNTER — Other Ambulatory Visit: Payer: Self-pay | Admitting: Hematology & Oncology

## 2014-08-19 LAB — PULMONARY FUNCTION TEST

## 2014-08-21 ENCOUNTER — Telehealth: Payer: Self-pay | Admitting: *Deleted

## 2014-08-21 NOTE — Telephone Encounter (Signed)
Patient was last seen in office on May 5th and had her PAC flushed. Her next appointment isn't until 09/16/14. This would place her about three months in between flushes. She is questioning if she should come sooner. Flushes should be every 6-8 weeks. Patient scheduled for a flush next week.

## 2014-08-26 ENCOUNTER — Ambulatory Visit (HOSPITAL_BASED_OUTPATIENT_CLINIC_OR_DEPARTMENT_OTHER): Payer: PPO

## 2014-08-26 VITALS — BP 110/78 | HR 107 | Temp 98.2°F | Wt 138.1 lb

## 2014-08-26 DIAGNOSIS — Z85118 Personal history of other malignant neoplasm of bronchus and lung: Secondary | ICD-10-CM

## 2014-08-26 DIAGNOSIS — Z452 Encounter for adjustment and management of vascular access device: Secondary | ICD-10-CM | POA: Diagnosis not present

## 2014-08-26 DIAGNOSIS — C3410 Malignant neoplasm of upper lobe, unspecified bronchus or lung: Secondary | ICD-10-CM

## 2014-08-26 MED ORDER — HEPARIN SOD (PORK) LOCK FLUSH 100 UNIT/ML IV SOLN
500.0000 [IU] | Freq: Once | INTRAVENOUS | Status: AC | PRN
  Administered 2014-08-26: 500 [IU] via INTRAVENOUS
  Filled 2014-08-26: qty 5

## 2014-08-26 MED ORDER — SODIUM CHLORIDE 0.9 % IJ SOLN
10.0000 mL | INTRAMUSCULAR | Status: DC | PRN
  Administered 2014-08-26: 10 mL via INTRAVENOUS
  Filled 2014-08-26: qty 10

## 2014-08-26 NOTE — Patient Instructions (Signed)

## 2014-09-01 ENCOUNTER — Telehealth: Payer: Self-pay | Admitting: *Deleted

## 2014-09-01 NOTE — Telephone Encounter (Signed)
Patient requesting her CT scans on a CD for her appointment tomorrow with pulmology. Explained to patient that this request could most certainly be filled, but that the doctor she was seeing tomorrow is on the same computer system and could see images via computer. She still wants to pick up a CD. Will contact radiology and have a CD made for patient pick up later today.

## 2014-09-02 ENCOUNTER — Ambulatory Visit (INDEPENDENT_AMBULATORY_CARE_PROVIDER_SITE_OTHER): Payer: PPO | Admitting: Pulmonary Disease

## 2014-09-02 ENCOUNTER — Encounter: Payer: Self-pay | Admitting: Pulmonary Disease

## 2014-09-02 ENCOUNTER — Ambulatory Visit (INDEPENDENT_AMBULATORY_CARE_PROVIDER_SITE_OTHER)
Admission: RE | Admit: 2014-09-02 | Discharge: 2014-09-02 | Disposition: A | Discharge status: Home / Self Care | Payer: PPO | Source: Ambulatory Visit | Attending: Pulmonary Disease | Admitting: Pulmonary Disease

## 2014-09-02 ENCOUNTER — Other Ambulatory Visit: Payer: PPO

## 2014-09-02 VITALS — BP 108/62 | HR 93 | Temp 97.8°F | Ht 61.0 in

## 2014-09-02 DIAGNOSIS — K449 Diaphragmatic hernia without obstruction or gangrene: Secondary | ICD-10-CM

## 2014-09-02 DIAGNOSIS — R0600 Dyspnea, unspecified: Secondary | ICD-10-CM

## 2014-09-02 DIAGNOSIS — K219 Gastro-esophageal reflux disease without esophagitis: Secondary | ICD-10-CM | POA: Diagnosis not present

## 2014-09-02 NOTE — Patient Instructions (Addendum)
1. Start taking Zantac or its generic equivalent 150 mg by mouth before bedtime in addition to the omeprazole you are already on. Remember to avoid citrus fruits, tomato products, and carbonated beverages. 2. Try going off of your Dulera to see if there is any worsening of your symptoms. 3. We are going to test her breathing with a walk test as well as a breathing test to see how severe your COPD is. 4. I am checking a blood test today for the genetic form of COPD. 5. I am checking a chest x-ray today to rule out any potential developing cause for your ongoing shortness of breath. 6. You will return to clinic in 4 weeks but contact my office if you have any further questions or concerns.

## 2014-09-02 NOTE — Progress Notes (Signed)
Subjective:    Patient ID: [name], female    DOB: 09/01/1950, 64 y.o.   MRN: [id number]  HPI Patient is a known 64 year old female with a history of adenocarcinoma of the right lung post right upper lobectomy and a subsequent adenocarcinoma of the left lung in 2008 post wedge resection and lymph node dissection. The patient subsequently completed a course of chemotherapy after her second lung malignancy. She had XRT to suspicious nodule right lung - May 2010. She underwent chemo & XRT in 2011 for another suspicious nodule. She reports she noticed she started having dyspnea in April of 2016. She reports an intermittent, nonproductive cough that she has attributed to her nasal drainage, especially in the morning. She has also had some wheezing that was found on auscultation. She and her husband felt like her hiatal hernia was recurring and contributing.  She reports that she has had an increase in reflux & dyspepsia.  She reports she is now having more significant epigastric pain to the point where she sometimes cannot tolerate her bra.  She has had "shooting pains" in her chest post-operatively, especially after her surgery in 2008. She noticed around the same time she has had increased sinus congestion & drainage.  She has been treated with Kenalog, Dulera 100/5, & a nebulizer treatment in office 3 weeks ago.  She has had some mild improvement in her dyspnea, especially with going up steps & carrying her granddaughter but still has a "tightness" in her chest.  She denies any fever, chills, or sweats.  She has lost weight but has early satiety so she doesn't eat as much. No palpitations or syncope.    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 No rashes but does bruise easily on ASA. A pertinent 14 point review of systems is negative except as per the history of presenting illness.  Allergies  Allergen Reactions  . Demerol Other (See  Comments)    Syncope  . Mefoxin [Cefoxitin Sodium In Dextrose]     rash  . Topamax Other (See Comments)    Had trouble walking  . Avelox [Moxifloxacin Hcl In Nacl]     Hypotension.   Current Outpatient Prescriptions on File Prior to Visit  Medication Sig Dispense Refill  . aspirin 81 MG tablet Take 81 mg by mouth daily.      . calcium-vitamin D (OSCAL) 250-125 MG-UNIT per tablet Take 1 tablet by mouth daily.      . Cholecalciferol (VITAMIN D) 2000 UNITS tablet Take 2,000 Units by mouth daily.    . fish oil-omega-3 fatty acids 1000 MG capsule Take 2 g by mouth daily.      Marland Kitchen KLOR-CON M20 20 MEQ tablet TAKE 1 TABLET BY MOUTH DAILY 30 tablet 3  . lidocaine-prilocaine (EMLA) cream Apply 1 application topically as needed.      Marland Kitchen losartan (COZAAR) 50 MG tablet Take 50 mg by mouth daily.     Marland Kitchen omeprazole (PRILOSEC) 40 MG capsule Take 40 mg by mouth 2 (two) times daily.   3  . PROVENTIL HFA 108 (90 BASE) MCG/ACT inhaler as needed.     . SUMAtriptan (IMITREX) 100 MG tablet Take 100 mg by mouth as needed. For H/A     Current Facility-Administered Medications on File Prior to Visit  Medication Dose Route Frequency Provider Last Rate Last Dose  . sodium chloride 0.9 % injection 10 mL  10 mL Intravenous PRN Oletta Lamas  Yvetta Coder, FNP   10 mL at 12/06/13 1149   Past Medical History  Diagnosis Date  . Cancer 2005 & 2008    Right Upper Lobe (2005) & Left Upper Lobe (2008). Subsequent Nodules treated empirically in 2010 & 2011 w/ XRT & chemo.  Marland Kitchen Hypertension   . Stroke   . GERD (gastroesophageal reflux disease)   . B12 deficiency   . Vitamin D deficiency    Past Surgical History  Procedure Laterality Date  . Vaginal hysterectomy  1983  . Cyst removed from ovary  1968  . Lung removal, partial  2005, 2008    R upper lobe and left upper lobe wedge  . Bronchoscopy    . Mediastinoscopy    . Cholecystectomy    . Appendectomy     Family History  Problem Relation Age of Onset  . Heart attack Father    . Hypertension Mother   . Diabetes Mother   . Congestive Heart Failure Mother   . Breast cancer Sister   . Lung disease Neg Hx    History   Social History  . Marital Status: Married    Spouse Name: N/A  . Number of Children: N/A  . Years of Education: N/A   Social History Main Topics  . Smoking status: Former Smoker -- 2.00 packs/day for 33 years    Types: Cigarettes    Start date: 07/19/1967    Quit date: 01/23/2003  . Smokeless tobacco: Never Used  . Alcohol Use: No  . Drug Use: No  . Sexual Activity: Not on file   Other Topics Concern  . None   Social History Narrative   Originally from [place]. She has always lived in [place]. Never traveled internationally. Remote travel to [facility]. She has a dog at home. Remote bird exposure to a parakeet. No hot tub exposure. Remote exposure to mold in their bathroom that was professionally remediated. Previously worked in a [employer]. No exposure to fumes or dusts & wore a mask.        Objective:   Physical Exam Blood pressure 108/62, pulse 93, temperature 97.8 F (36.6 C), temperature source Oral, height '5\' 1"'$  (1.549 m), SpO2 95 %. General:  Awake. Alert. No acute distress. Well-dressed Caucasian female accompanied by husband today. Integument:  Warm & dry. No rash on exposed skin. No bruising. Lymphatics:  No appreciated cervical or supraclavicular lymphadenoapthy. HEENT:  Moist mucus membranes. No oral ulcers. No scleral injection or icterus. PERRL. Cardiovascular:  Regular rate. No edema. No appreciable JVD.  Pulmonary:  Good aeration & clear to auscultation bilaterally. Symmetric chest wall expansion. No accessory muscle use. Abdomen: Soft. Normal bowel sounds. Nondistended. Tender to palpation in the epigastric area and right upper quadrant. Musculoskeletal:  Normal bulk and tone. Hand grip strength 5/5 bilaterally. No joint deformity or effusion appreciated. Patient tender to palpation over the lower  one third of sternum without deformity. Neurological:  CN 2-12 grossly in tact. No meningismus. Moving all 4 extremities equally. Symmetric patellar deep tendon reflexes. Psychiatric:  Mood and affect congruent. Speech normal rhythm, rate & tone.   PFT 08/19/14: FVC 1.38 L (47%) FEV1 0.98 L (22%) FEV1/FVC 0.68 FEF 25-75 0.59 L (29%)  LABS 06/16/14 CBC: 9.1/12.7/31/214 BMP: 137/3.9/104/24/14/0.66/83/1.1 LFT: 4.1/6.6/0.6/55/15/16  IMAGING CTA CHEST 06/16/14 (personally reviewed by me): No pulmonary emboli. Predominantly groundglass opacification within the right lower lobe seems to be less dense than on previous imaging. There is some wedge-shaped atelectasis versus opacification  with questionable air bronchograms in the left lower lobe. No pleural effusion appreciated. No pathologic mediastinal adenopathy appreciated. Tiny umbilical hernia containing fat and bilateral inguinal hernias containing fat. Moderate-sized hiatal hernia.    Assessment & Plan:  Patient is a very pleasant 64 year old female with a function testing from this month showing very severe airways obstruction and suggesting concomitant restriction. This is difficult to interpret in the setting of prior right upper lobectomy and left upper lobe wedge resection. Additionally confounding the suggestion of restriction is the presence of her moderate hiatal hernia which seems to be causing significant symptoms. Certainly I believe this is a contributing factor to her weight loss and decreased caloric intake. There did not appear to be significant emphysema on her chest CT scan from May or progression of parenchymal lung disease that would account for her continued dyspnea. The somewhat sudden onset of her symptoms could certainly be attributable to worsening in her hiatal hernia. I did speak at length with the patient and her husband regarding the probability that her symptoms are multifactorial not only from her hiatal hernia but also  underlying COPD, lung resection, and decreased caloric intake coupled with poor quality of sleep. I instructed the patient contacted my office if she had any further questions or concerns.  1. Very severe COPD: Checking full pulmonary function testing in 6 minute walk test. We will attempt to obtain records of her pulmonary function testing from prior to and after her lobectomy and wedge resections. Advised the patient she could try holding her Dulera to see if there is a significant symptomatic benefit. Checking alpha-1 antitrypsin today. 2. Dyspnea: Likely multifactorial. Checking chest x-ray PA/LAT today to rule out any developing parenchymal disease. 3. GERD with hiatal hernia: Counseled the patient on appropriate dietary and lifestyle modifications. She will continue to take Prilosec twice a day and add Zantac 150 mg by mouth daily at bedtime to her regimen. 4. History of bilateral lung cancer: Patient status post resection 2, XRT, & chemotherapy. No evidence of recurrence on CT scan from May 2016. 5. Weight loss: Likely due to decreased caloric intake. Advised the patient try eating frequent small meals. 6. Follow-up: Patient to return to clinic in 4 weeks.

## 2014-09-04 LAB — ALPHA-1-ANTITRYPSIN: A1 ANTITRYPSIN SER: 159 mg/dL (ref 83–199)

## 2014-09-07 IMAGING — MR MR HEAD WO/W CM
10 of 11 series · 33 of 48 positions shown · IV contrast (multihance)
Comparison: CT head 06/29/2008

CLINICAL DATA: Lung cancer.  Rule out metastatic disease.  History
of cerebral aneurysm surgery.

MRI HEAD WITHOUT AND WITH CONTRAST
TECHNIQUE: Multiplanar, multiecho pulse sequences of the brain and
surrounding structures were obtained according to standard protocol
without and with intravenous contrast
Contrast: 15mL MULTIHANCE GADOBENATE DIMEGLUMINE 529 MG/ML IV SOLN

[Series 2: T1 · sagittal · 5.0mm · 0.45mm/px · 1 of 19 slices shown]
[im 1/19]
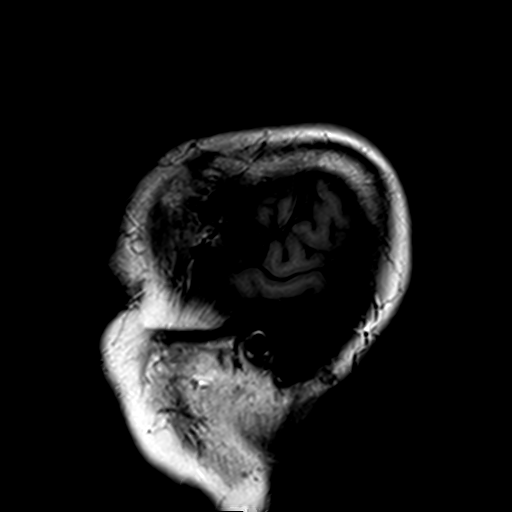

[Series 3: DWI · axial · 5.0mm · 0.90mm/px · z∈[-80,+57]mm · 5 of 44 slices shown]
[im 1/44]
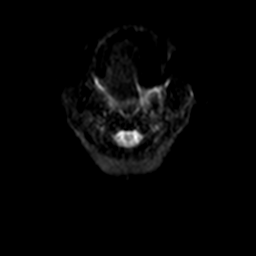
[im 11/44]
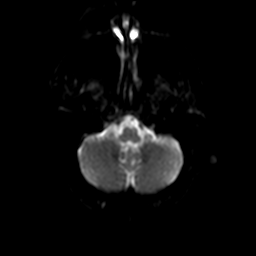
[im 22/44]
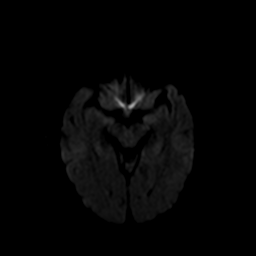
[im 33/44]
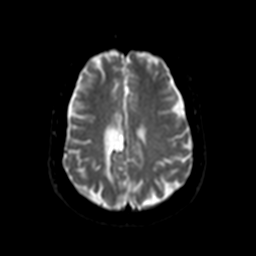
[im 44/44]
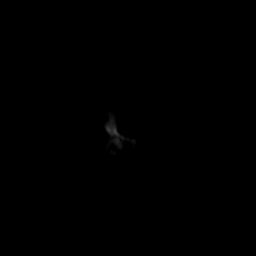

[Series 4: dwi_adc · axial · 5.0mm · 0.90mm/px · z∈[-80,+57]mm · 3 of 22 slices shown]
[im 1/22]
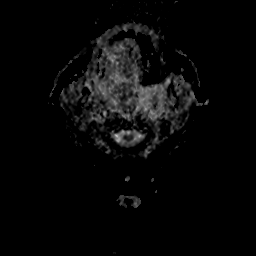
[im 11/22]
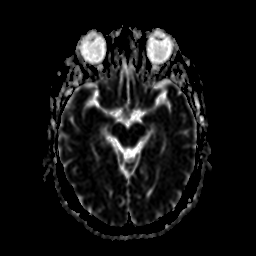
[im 22/22]
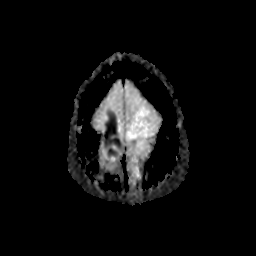

[Series 6: swi_images · axial · 2.2mm · 0.90mm/px · z∈[-78,+55]mm · 7 of 60 slices shown]
[im 1/60]
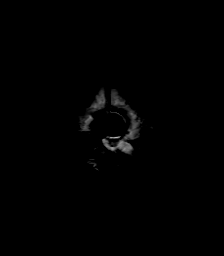
[im 10/60]
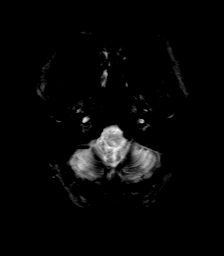
[im 20/60]
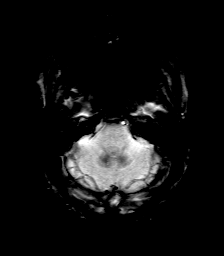
[im 30/60]
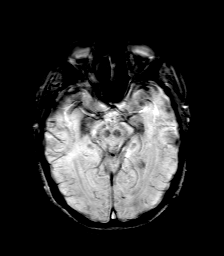
[im 40/60]
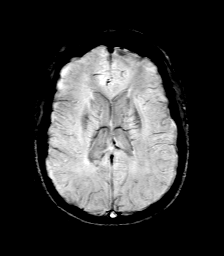
[im 50/60]
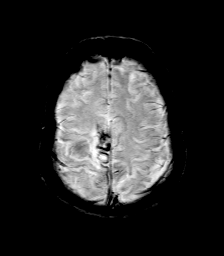
[im 60/60]
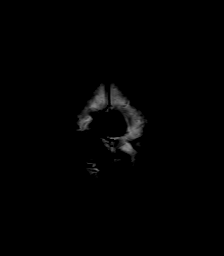

[Series 7: T2 · axial · 5.0mm · 0.45mm/px · z∈[-79,+57]mm · 3 of 22 slices shown (1 of 2)]
[im 1/22]
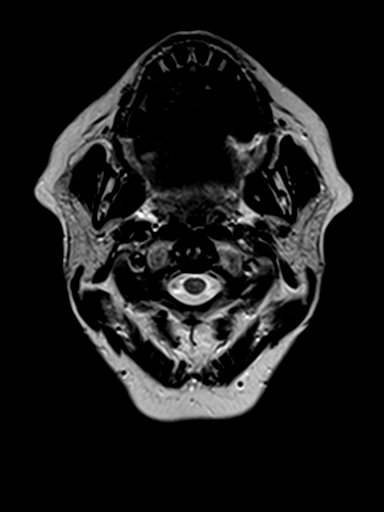
[im 11/22]
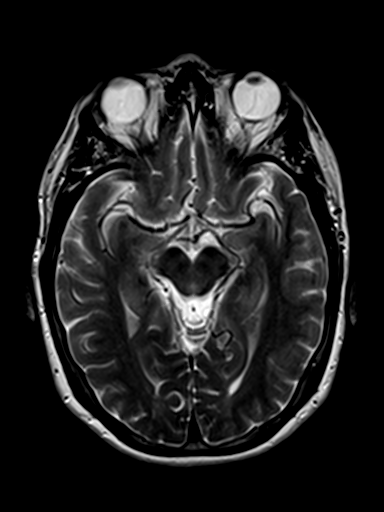
[im 22/22]
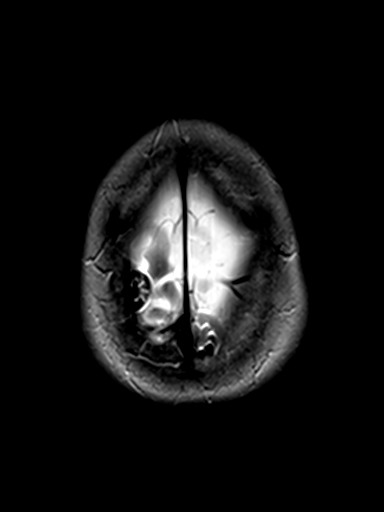

[Series 8: FLAIR · axial · 5.0mm · 0.45mm/px · z∈[-79,+57]mm · 3 of 22 slices shown]
[im 1/22]
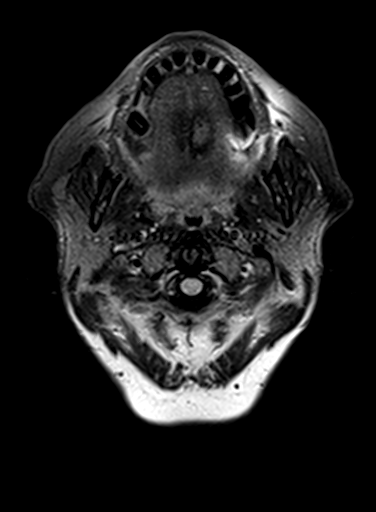
[im 11/22]
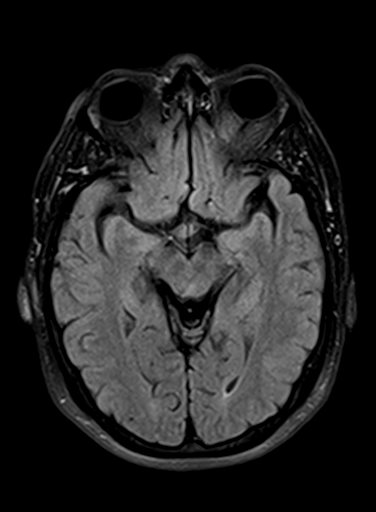
[im 22/22]
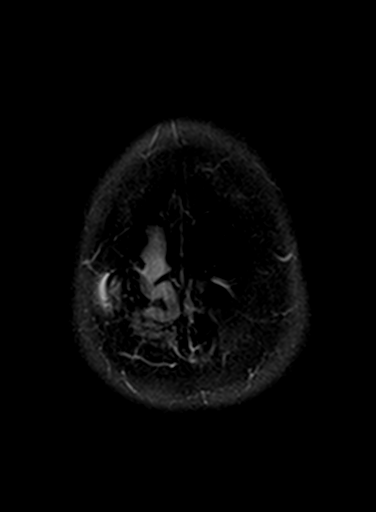

[Series 9: axial (person_name)1 volume · axial · 2.0mm · 0.45mm/px · z∈[-82,-48]mm · 3 of 72 slices shown]
[im 1/72]
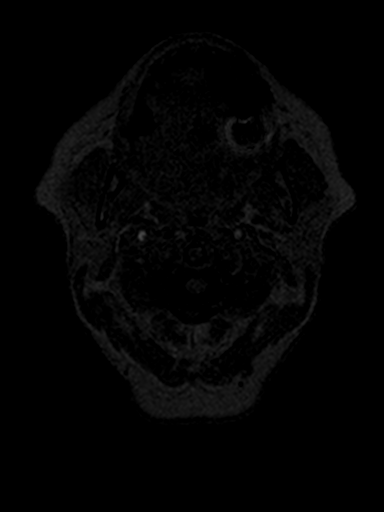
[im 9/72]
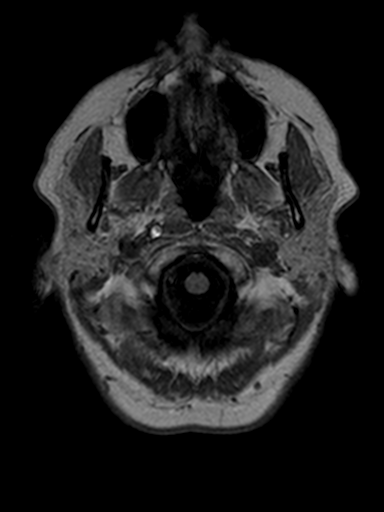
[im 18/72]
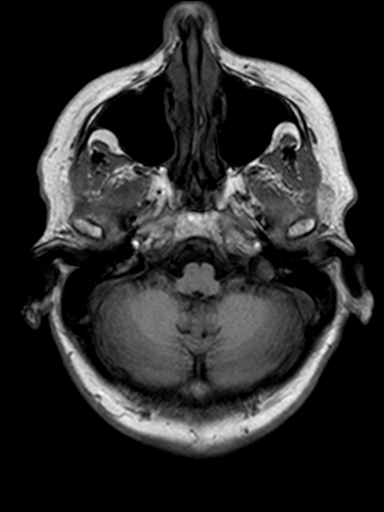

[Series 10: T2 · coronal · 5.0mm · 0.41mm/px · 3 of 24 slices shown (2 of 2)]
[im 1/24]
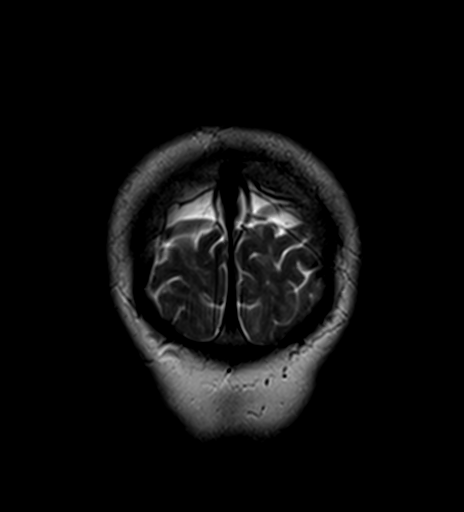
[im 12/24]
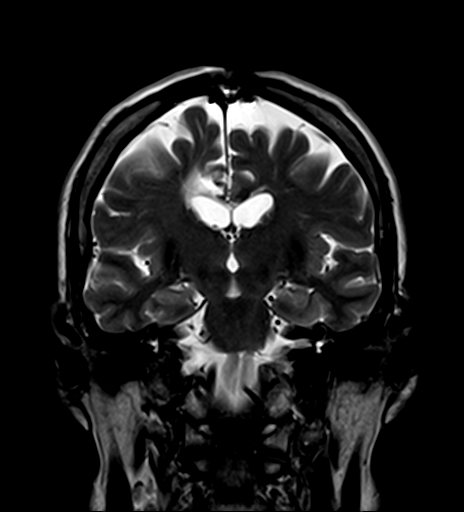
[im 24/24]
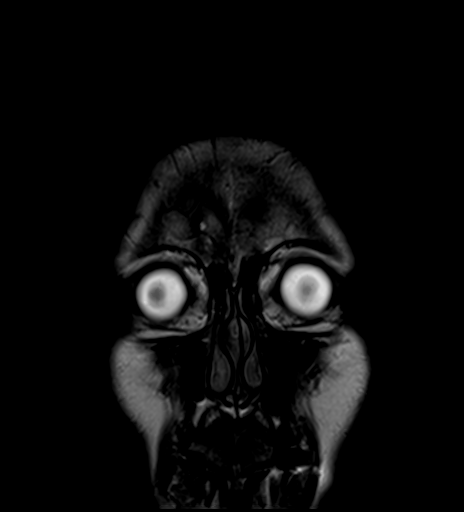

[Series 11: T1 post-contrast · coronal · 5.0mm · 0.41mm/px · 3 of 24 slices shown (1 of 2)]
[im 1/24]
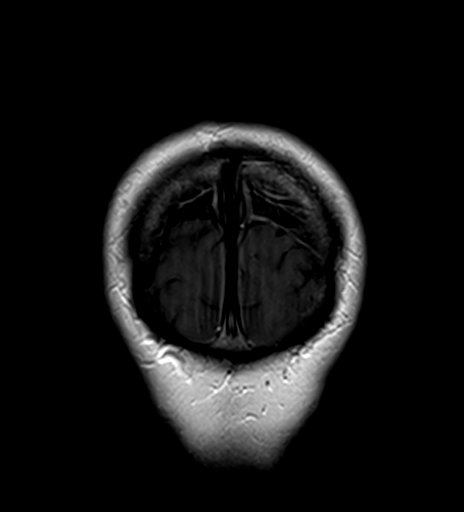
[im 12/24]
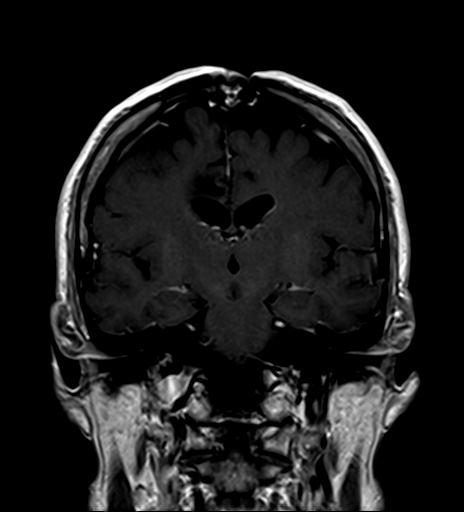
[im 24/24]
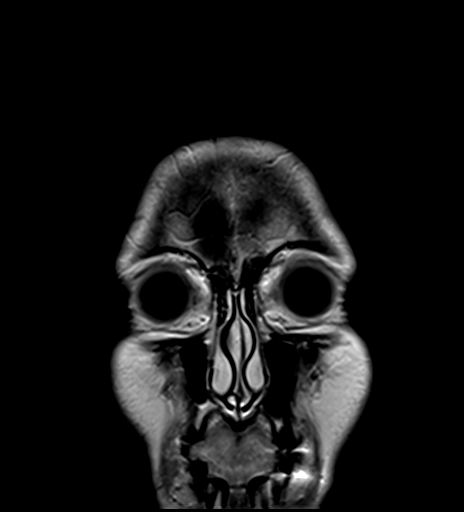

[Series 13: T1 post-contrast · sagittal · 5.0mm · 0.45mm/px · 2 of 19 slices shown (2 of 2)]
[im 1/19]
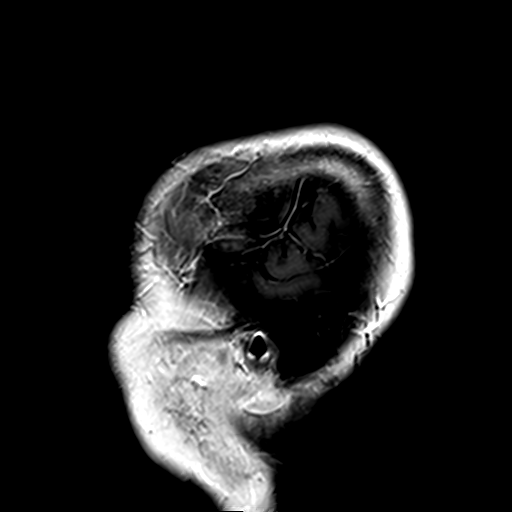
[im 19/19]
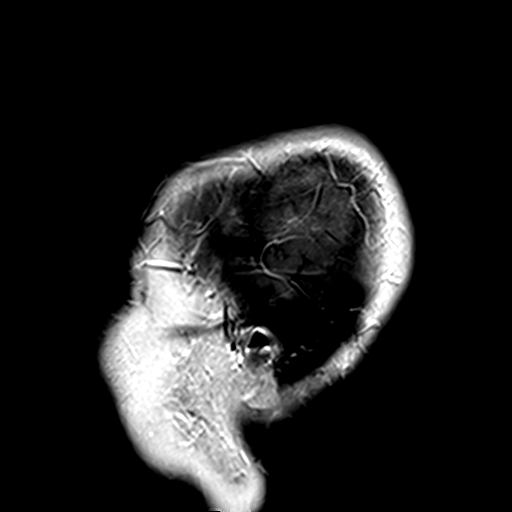

[33 of 48 positions shown; findings below may reference images not displayed]

FINDINGS: No evidence of aneurysm clip on the prior head CT.  The
patient has had   MRI scans since craniotomy 1331.  I am not clear
from the history what type of aneurysm surgery or other craniotomy
surgery was performed.

Right parietal convexity craniotomy.  There is postsurgical
encephalomalacia in the right medial parietal lobe with   chronic
blood products related to surgery in the area.  Question prior
tumor resection.  No evidence of recurrent tumor in this area.

Negative for acute infarct.  Brainstem and cerebellum are normal.
The basal ganglia are normal.  Ventricle size is normal.

Postcontrast imaging reveals normal enhancement.  No evidence of
metastatic disease or neoplasm identified.
IMPRESSION: Negative for metastatic disease.  No acute abnormality.

Postop craniotomy and surgical resection in the right medial
parietal lobe.  No recurrent mass lesion in this area.

## 2014-09-12 ENCOUNTER — Telehealth: Payer: Self-pay | Admitting: Pulmonary Disease

## 2014-09-12 NOTE — Telephone Encounter (Signed)
LMTCB

## 2014-09-12 NOTE — Telephone Encounter (Signed)
lmtcb

## 2014-09-12 NOTE — Telephone Encounter (Signed)
Pt returned call  [phone]

## 2014-09-12 NOTE — Telephone Encounter (Signed)
[phone] calling back

## 2014-09-15 NOTE — Telephone Encounter (Signed)
lmtcb for pt.  

## 2014-09-16 ENCOUNTER — Encounter: Payer: Self-pay | Admitting: Hematology & Oncology

## 2014-09-16 ENCOUNTER — Ambulatory Visit (HOSPITAL_BASED_OUTPATIENT_CLINIC_OR_DEPARTMENT_OTHER): Payer: PPO | Admitting: Hematology & Oncology

## 2014-09-16 VITALS — BP 120/83 | HR 83 | Temp 98.1°F | Resp 16 | Wt 136.0 lb

## 2014-09-16 DIAGNOSIS — G8929 Other chronic pain: Secondary | ICD-10-CM

## 2014-09-16 DIAGNOSIS — C7801 Secondary malignant neoplasm of right lung: Secondary | ICD-10-CM

## 2014-09-16 DIAGNOSIS — Z85118 Personal history of other malignant neoplasm of bronchus and lung: Secondary | ICD-10-CM | POA: Diagnosis not present

## 2014-09-16 DIAGNOSIS — M549 Dorsalgia, unspecified: Secondary | ICD-10-CM | POA: Diagnosis not present

## 2014-09-16 DIAGNOSIS — R0602 Shortness of breath: Secondary | ICD-10-CM

## 2014-09-16 DIAGNOSIS — M898X1 Other specified disorders of bone, shoulder: Principal | ICD-10-CM

## 2014-09-16 NOTE — Telephone Encounter (Signed)
Spoke with patient - Requesting to reschedule her appt with JN/PFT/6MWT. Aware that if we reschedule her appts they wont all be at the same location nor on the same day d/t availability and short notice.  Pt wishes to keep appts as they are for now. Nothing further needed.

## 2014-09-17 NOTE — Progress Notes (Signed)
Hematology and Oncology Follow Up Visit  [name] [id number] July 19, 1950 64 y.o. 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 She still has a lot of stress now. There are still a lot of family problems that she is try to deal with.   She is complaining of some shortness of breath. She is seeing a pulmonologist. There are doing test on her.  She is losing some weight. She has wanted to lose weight in the past but never could. She is not losing some weight. She is complaining of pain in the back and hips. With her history of lung cancer that is recurrent, she will close need to be evaluated. I think a bone scan is the way to go.  She has had no change in bowel or bladder habits. There's been no nausea or vomiting. Her appetite is decreased a little bit.  She's had no bleeding.  She's had no leg swelling.  She has had no issues with her aneurysms. She's had no headache.   Medications:  Current outpatient prescriptions:  .  aspirin 81 MG tablet, Take 81 mg by mouth daily.  , Disp: , Rfl:  .  calcium-vitamin D (OSCAL) 250-125 MG-UNIT per tablet, Take 1 tablet by mouth daily.  , Disp: , Rfl:  .  Cholecalciferol (VITAMIN D) 2000 UNITS tablet, Take 2,000 Units by mouth daily., Disp: , Rfl:  .  fish oil-omega-3 fatty acids 1000 MG capsule, Take 2 g by mouth daily.  , Disp: , Rfl:  .  KLOR-CON M20 20 MEQ tablet, TAKE 1 TABLET BY MOUTH DAILY, Disp: 30 tablet, Rfl: 3 .  lidocaine-prilocaine (EMLA) cream, Apply 1 application topically as needed.  , Disp: , Rfl:  .  losartan (COZAAR) 50 MG tablet, Take 50 mg by mouth daily. , Disp: , Rfl:  .  mometasone-formoterol (DULERA) 100-5 MCG/ACT AERO, Inhale 1 puff into the lungs 2 (two) times daily., Disp: ,  Rfl:  .  omeprazole (PRILOSEC) 40 MG capsule, Take 40 mg by mouth 2 (two) times daily. , Disp: , Rfl: 3 .  PROVENTIL HFA 108 (90 BASE) MCG/ACT inhaler, as needed. , Disp: , Rfl:  .  SUMAtriptan (IMITREX) 100 MG tablet, Take 100 mg by mouth as needed. For H/A, Disp: , Rfl:  No current facility-administered medications for this visit.  Facility-Administered Medications Ordered in Other Visits:  .  sodium chloride 0.9 % injection 10 mL, 10 mL, Intravenous, PRN, Golden Pop, FNP, 10 mL at 12/06/13 1149  Allergies:  Allergies  Allergen Reactions  . Demerol Other (See Comments)    Syncope  . Mefoxin [Cefoxitin Sodium In Dextrose]     rash  . Topamax Other (See Comments)    Had trouble walking  . Avelox [Moxifloxacin Hcl In Nacl]     Hypotension.    Past Medical History, Surgical history, Social history, and Family History were reviewed and updated.  Review of Systems: As above  Physical Exam:  weight is 136 lb (61.689 kg). Her oral temperature is 98.1 F (36.7 C). Her blood pressure is 120/83 and her pulse is 83. Her respiration is 16 and oxygen saturation is 99%.   Well-developed and well-nourished white female. Head and neck exam shows no ocular or oral. There are  no palpable cervical or supraclavicular lymph nodes. Lungs are clear. Cardiac exam regular rate and rhythm with no murmurs rubs or bruits. Abdomen is soft. Has good bowel sounds. There is no fluid wave. There is no palpable liver or spleen tip. Back exam shows a thoracotomy scar in the right lateral chest wall. This is well-healed. No tenderness is noted over the back. Extremities shows no clubbing cyanosis or edema. Skin exam no rashes.  Lab Results  Component Value Date   WBC 9.1 06/16/2014   HGB 12.7 06/16/2014   HCT 38.1 06/16/2014   MCV 86 06/16/2014   PLT 214 06/16/2014     Chemistry      Component Value Date/Time   NA 137 06/16/2014 0934   NA 142 03/17/2014 1103   K 3.9 06/16/2014 0934   K 3.8  03/17/2014 1103   CL 104 06/16/2014 0934   CL 98 03/17/2014 1103   CO2 24 06/16/2014 0934   CO2 25 03/17/2014 1103   BUN 15 06/16/2014 0934   BUN 9 03/17/2014 1103   CREATININE 0.66 06/16/2014 0934   CREATININE 0.6 03/17/2014 1103      Component Value Date/Time   CALCIUM 9.1 06/16/2014 0934   CALCIUM 8.8 03/17/2014 1103   ALKPHOS 55 06/16/2014 0934   ALKPHOS 49 03/17/2014 1103   AST 15 06/16/2014 0934   AST 22 03/17/2014 1103   ALT 16 06/16/2014 0934   ALT 15 03/17/2014 1103   BILITOT 0.6 06/16/2014 0934   BILITOT 0.50 03/17/2014 1103         Impression and Plan: [name] is 64 year old white female with history of recurrent adenocarcinoma of the lung. She's had local recurrence. She was treated with radiation and chemotherapy for that recurrence. It probably now has been 4 years since she had recurrence.  She finished her treatments back in April 2012.  She still is at risk for recurrence.  I spent about 45 minutes talking with her I'm not sure what the issue is with her back. I really think that were going to have to get a bone scan on her. She is at risk for recurrence.  I will go ahead and plan to give her back for a routine visit in 3 months. We will get her back sooner depending on what the bone scan shows.  She will come back in 6 weeks for a Port-A-Cath flush.  It has now been 10 years since I first saw her. She has done incredibly well.  Volanda Napoleon, MD 8/10/20165:19 PM

## 2014-09-26 ENCOUNTER — Encounter (HOSPITAL_COMMUNITY)
Admission: RE | Admit: 2014-09-26 | Discharge: 2014-09-26 | Disposition: A | Discharge status: Home / Self Care | Payer: PPO | Source: Ambulatory Visit | Attending: Hematology & Oncology | Admitting: Hematology & Oncology

## 2014-09-26 DIAGNOSIS — C7801 Secondary malignant neoplasm of right lung: Secondary | ICD-10-CM

## 2014-09-26 DIAGNOSIS — M898X1 Other specified disorders of bone, shoulder: Secondary | ICD-10-CM | POA: Insufficient documentation

## 2014-09-26 DIAGNOSIS — G8929 Other chronic pain: Secondary | ICD-10-CM | POA: Diagnosis present

## 2014-09-26 MED ORDER — TECHNETIUM TC 99M MEDRONATE IV KIT
25.7000 | PACK | Freq: Once | INTRAVENOUS | Status: AC | PRN
  Administered 2014-09-26: 25.7 via INTRAVENOUS

## 2014-10-02 ENCOUNTER — Ambulatory Visit (INDEPENDENT_AMBULATORY_CARE_PROVIDER_SITE_OTHER): Payer: PPO | Admitting: Pulmonary Disease

## 2014-10-02 ENCOUNTER — Encounter: Payer: Self-pay | Admitting: Pulmonary Disease

## 2014-10-02 ENCOUNTER — Ambulatory Visit (INDEPENDENT_AMBULATORY_CARE_PROVIDER_SITE_OTHER)
Admission: RE | Admit: 2014-10-02 | Discharge: 2014-10-02 | Disposition: A | Discharge status: Home / Self Care | Payer: PPO | Source: Ambulatory Visit | Attending: Pulmonary Disease | Admitting: Pulmonary Disease

## 2014-10-02 ENCOUNTER — Ambulatory Visit: Payer: PPO | Admitting: Pulmonary Disease

## 2014-10-02 VITALS — BP 110/66 | HR 97 | Ht 61.0 in | Wt 135.0 lb

## 2014-10-02 DIAGNOSIS — J449 Chronic obstructive pulmonary disease, unspecified: Secondary | ICD-10-CM

## 2014-10-02 DIAGNOSIS — Z85118 Personal history of other malignant neoplasm of bronchus and lung: Secondary | ICD-10-CM

## 2014-10-02 DIAGNOSIS — K449 Diaphragmatic hernia without obstruction or gangrene: Secondary | ICD-10-CM

## 2014-10-02 DIAGNOSIS — J984 Other disorders of lung: Secondary | ICD-10-CM

## 2014-10-02 DIAGNOSIS — K219 Gastro-esophageal reflux disease without esophagitis: Secondary | ICD-10-CM | POA: Diagnosis not present

## 2014-10-02 DIAGNOSIS — R0600 Dyspnea, unspecified: Secondary | ICD-10-CM | POA: Diagnosis not present

## 2014-10-02 DIAGNOSIS — R918 Other nonspecific abnormal finding of lung field: Secondary | ICD-10-CM

## 2014-10-02 LAB — PULMONARY FUNCTION TEST
DL/VA % PRED: 84 %
DL/VA: 3.72 ml/min/mmHg/L
DLCO UNC: 9.16 ml/min/mmHg
DLCO unc % pred: 45 %
FEF 25-75 POST: 0.91 L/s
FEF 25-75 PRE: 0.8 L/s
FEF2575-%Change-Post: 14 %
FEF2575-%Pred-Post: 46 %
FEF2575-%Pred-Pre: 40 %
FEV1-%Change-Post: 1 %
FEV1-%PRED-POST: 56 %
FEV1-%Pred-Pre: 55 %
FEV1-Post: 1.22 L
FEV1-Pre: 1.19 L
FEV1FVC-%CHANGE-POST: 3 %
FEV1FVC-%Pred-Pre: 92 %
FEV6-%Change-Post: -1 %
FEV6-%Pred-Post: 61 %
FEV6-%Pred-Pre: 62 %
FEV6-POST: 1.65 L
FEV6-Pre: 1.67 L
FEV6FVC-%PRED-POST: 104 %
FEV6FVC-%Pred-Pre: 104 %
FVC-%Change-Post: -1 %
FVC-%Pred-Post: 58 %
FVC-%Pred-Pre: 59 %
FVC-Post: 1.65 L
FVC-Pre: 1.67 L
POST FEV6/FVC RATIO: 100 %
PRE FEV1/FVC RATIO: 71 %
Post FEV1/FVC ratio: 74 %
Pre FEV6/FVC Ratio: 100 %
RV % pred: 79 %
RV: 1.54 L
TLC % PRED: 70 %
TLC: 3.22 L

## 2014-10-02 MED ORDER — SUCRALFATE 1 GM/10ML PO SUSP: ORAL | Status: DC

## 2014-10-02 NOTE — Progress Notes (Signed)
PFT performed today. 

## 2014-10-02 NOTE — Progress Notes (Signed)
PFT 10/02/14: FVC 1.67 L (59%) FEV1 1.19 L (55%) FEV1/FVC 0.71 FEF 25-75 0.80 L (40%) no bronchodilator response TLC 3.22 L (70%) RV 79% ERV 72% DLCO uncorrected 45%  6MWT 10/02/14:  Walked 400 meters / Baseline Sat 97% on RA / Nadir Sat 94% on RA

## 2014-10-02 NOTE — Progress Notes (Signed)
Subjective:    Patient ID: [name], female    DOB: March 09, 1950, 64 y.o.   MRN: [id number]  C.C.:  Followup for COPD, GERD w/ hiatal hernia & h/o bilateral lung cancer.  HPI  Dyspnea:  Patient reports her dyspnea had been improving but then starting getting worse again on Monday of last week. No subjective fever, chills, or sweats. She reports pain in her mid-sternum going around to her back.  Very severe COPD: Patient counseled to try holding her Dulera at last appointment. Serum alpha-1 antitrypsin level normal at last appointment. She has a persistent nonproductive cough. She reports she did have some wheezing recently and restarted her Dulera. She has been using her Dulera intermittently with questionable relief of chest discomfort or cough. Hasn't been using her rescue inhaler. She does have nocturnal coughing that is the same as daytime coughing.  GERD w/ Hiatal Hernia:  Added Zantac '150mg'$  to regimen of Prilosec twice a day at last appointment. Reports she is compliant with her regimen. She reports lack of appetite and feels "full" in the morning. She denies any reflux of stomach contents into her mouth. She had more reflux this morning.   H/O Bilateral Lung Cancer:  S/P resection twice, XRT & chemo. Last Chest CT May 2016 without recurrance. Atelectasis with questionable air-bronchograms in left lower lobe on CT. CXR shows similar findings from last appointment in July.   Review of Systems She denies any dysphagia or odynophagia. She reports significant epigastric discomfort after her bone density scan. She continues to have pain in her lower thorax under her ribs. Denies any pleurisy. She reports constant pain that is unrelenting. She reports increased sinus congestion & pressure. She still feels like her left ear is still "stopped up". Still having post-nasal drainage.   Allergies  Allergen Reactions  . Demerol Other (See Comments)    Syncope  . Mefoxin [Cefoxitin Sodium In  Dextrose]     rash  . Topamax Other (See Comments)    Had trouble walking  . Avelox [Moxifloxacin Hcl In Nacl]     Hypotension.   Current Outpatient Prescriptions on File Prior to Visit  Medication Sig Dispense Refill  . aspirin 81 MG tablet Take 81 mg by mouth daily.      . calcium-vitamin D (OSCAL) 250-125 MG-UNIT per tablet Take 1 tablet by mouth daily.      . Cholecalciferol (VITAMIN D) 2000 UNITS tablet Take 2,000 Units by mouth daily.    . fish oil-omega-3 fatty acids 1000 MG capsule Take 2 g by mouth daily.      Marland Kitchen KLOR-CON M20 20 MEQ tablet TAKE 1 TABLET BY MOUTH DAILY 30 tablet 3  . lidocaine-prilocaine (EMLA) cream Apply 1 application topically as needed.      Marland Kitchen losartan (COZAAR) 50 MG tablet Take 50 mg by mouth daily.     Marland Kitchen omeprazole (PRILOSEC) 40 MG capsule Take 40 mg by mouth 2 (two) times daily.   3  . PROVENTIL HFA 108 (90 BASE) MCG/ACT inhaler as needed.     . SUMAtriptan (IMITREX) 100 MG tablet Take 100 mg by mouth as needed. For H/A    . mometasone-formoterol (DULERA) 100-5 MCG/ACT AERO Inhale 1 puff into the lungs 2 (two) times daily.     Current Facility-Administered Medications on File Prior to Visit  Medication Dose Route Frequency Provider Last Rate Last Dose  . sodium chloride 0.9 % injection 10 mL  10 mL Intravenous PRN Golden Pop, FNP  10 mL at 12/06/13 1149   Past Medical History  Diagnosis Date  . Cancer 2005 & 2008    Right Upper Lobe (2005) & Left Upper Lobe (2008). Subsequent Nodules treated empirically in 2010 & 2011 w/ XRT & chemo.  Marland Kitchen Hypertension   . Stroke   . GERD (gastroesophageal reflux disease)   . B12 deficiency   . Vitamin D deficiency    Past Surgical History  Procedure Laterality Date  . Vaginal hysterectomy  1983  . Cyst removed from ovary  1968  . Lung removal, partial  2005, 2008    R upper lobe and left upper lobe wedge  . Bronchoscopy    . Mediastinoscopy    . Cholecystectomy    . Appendectomy    . Hiatal hernia repair   2010   Family History  Problem Relation Age of Onset  . Heart attack Father   . Hypertension Mother   . Diabetes Mother   . Congestive Heart Failure Mother   . Breast cancer Sister   . Lung disease Neg Hx    Social History   Social History  . Marital Status: Married    Spouse Name: N/A  . Number of Children: N/A  . Years of Education: N/A   Social History Main Topics  . Smoking status: Former Smoker -- 2.00 packs/day for 33 years    Types: Cigarettes    Start date: 07/19/1967    Quit date: 01/23/2003  . Smokeless tobacco: Never Used  . Alcohol Use: No  . Drug Use: No  . Sexual Activity: Not Asked   Other Topics Concern  . None   Social History Narrative   Originally from [place]. She has always lived in [place]. Never traveled internationally. Remote travel to [facility]. She has a dog at home. Remote bird exposure to a parakeet. No hot tub exposure. Remote exposure to mold in their bathroom that was professionally remediated. Previously worked in a [employer]. No exposure to fumes or dusts & wore a mask.       Objective:   Physical Exam Blood pressure 110/66, pulse 97, height '5\' 1"'$  (1.549 m), weight 135 lb (61.236 kg), SpO2 95 %. General:  Awake. Alert. No distress. Husband present again today. Integument:  Warm & dry. No rash on exposed skin.  HEENT:  Moist mucus membranes. No oral ulcers. No scleral injection.  Cardiovascular:  Regular rate. No edema. No appreciable JVD.  Pulmonary:  Clear bilaterally to auscultation. Symmetric chest wall expansion. Peaking in complete sentences. Abdomen: Soft. Normal bowel sounds. Nondistended. Remains exquisitely tender to palpation right upper quadrant & epigastrium.  PFT 10/02/14: FVC 1.67 L (59%) FEV1 1.19 L (55%) FEV1/FVC 0.71 FEF 25-75 0.80 L (40%) no bronchodilator response TLC 3.22 L (70%) RV 79% ERV 72% DLCO uncorrected 45% 08/19/14: FVC 1.38 L (47%) FEV1 0.98 L (22%) FEV1/FVC 0.68 FEF 25-75 0.59 L  (29%)  6MWT 10/02/14: Walked 400 meters / Baseline Sat 97% on RA / Nadir Sat 94% on RA  LABS 09/02/14 Alpha-1 antitrypsin: 159  06/16/14 CBC: 9.1/12.7/31/214 BMP: 137/3.9/104/24/14/0.66/83/1.1 LFT: 4.1/6.6/0.6/55/15/16  IMAGING CXR PA/LAT 09/02/14 (personally reviewed by me):  Left lower lobe opacity consistent with known atelectasis from CT 06/2014. No new opacity or nodule appreciated. No pleural thickening. Questionable associated left pleural effusion. Heart normal in size. Mediastinum normal in contour. Right port-a-cath noted.  CTA CHEST 06/16/14 (previously reviewed by me): No pulmonary emboli. Predominantly groundglass opacification within the right lower lobe  seems to be less dense than on previous imaging. There is some wedge-shaped atelectasis versus opacification with questionable air bronchograms in the left lower lobe. No pleural effusion appreciated. No pathologic mediastinal adenopathy appreciated. Tiny umbilical hernia containing fat and bilateral inguinal hernias containing fat. Moderate-sized hiatal hernia.    Assessment & Plan:  64 year old female with ongoing abdominal discomfort likely originating from her hiatal hernia. I am concerned that her ongoing problems with dyspnea are related to abdominal pain with deep inspiration. I reviewed her CXR with her and her husband which appears unchanged. I believe her new cough is likely secondary to her post-nasal drainage. We spent a significant amount of time today discussing the propspect of surgical correction of her hiatal hernia. Her pulmonary function testing does shows restriction as well as a reduced carbon monoxide diffusion capacity consistent with her prior lung surgeries but is improved compared with July. This makes me less suspicious about worsening of her underlying COPD/lung function as a cause for her dyspnea. I instructed her to contact my office if she has any further questions or concerns.  1. Restrictive lung  disease.  :Secondary to prior lung resection surgeries. No evidence for interstitial lung disease on imaging. No further testing necessary. 2. Very severe COPD:  Continue to use albuterol inhaler as needed. Holding [place]. Repeat spirometry at next appointment. 3. GERD/Hiatal Hernia:  Continuing Prilosec & Zantac as previously ordered. Prescribing Carafate suspension. Patient is going to request a referral to general surgery for correction of her hiatal hernia from her PCP. 4.  H/O Lung Cancer:  S/P resection & treatment twice. Follows with medical oncology. No signs of recurrance. 5.  Left Lower Lobe Opacity:  Repeat CXR PA/LAT today. Likely compressive atelectasis from pain with inspiration. 6.  Follow-up:  Return to clinic in 1-2 months.

## 2014-10-02 NOTE — Patient Instructions (Signed)
1.  I am checking a chest x-ray today and will call if there are any new findings. 2.  Please check with Dr. Unk Lightning to see whom he would recommend to correct your hiatal hernia. 3.  I would stop using your [facility] altogether & just use your albuterol inhaler as needed for coughing, wheezing, or shortness of breath. 4.  We are repeating breathing tests at your next appointment. 5.  You can use the liquid Carafate as needed for your chest discomfort & upset stomach. 6.  You will return to see me in 1-2 months but call if you have any questions or concerns.

## 2014-10-15 ENCOUNTER — Telehealth: Payer: Self-pay | Admitting: Pulmonary Disease

## 2014-10-15 NOTE — Telephone Encounter (Signed)
Last 2 ov notes and cxr report faxed to Dr Unk Lightning at the number given

## 2014-10-23 ENCOUNTER — Telehealth: Payer: Self-pay | Admitting: Pulmonary Disease

## 2014-10-23 DIAGNOSIS — K449 Diaphragmatic hernia without obstruction or gangrene: Secondary | ICD-10-CM

## 2014-10-23 DIAGNOSIS — R0600 Dyspnea, unspecified: Secondary | ICD-10-CM

## 2014-10-23 NOTE — Telephone Encounter (Signed)
Spoke with the patient today. Continues to have dyspnea and reportedly was seen by surgery who felt that her hiatal hernia was not contributing to her symptoms. I remain concerned given the abrupt onset of her symptoms this spring. I am going to try the patient on Spiriva to help with her COPD. I'm ordering a CT scan of the chest without contrast to reevaluate her hiatal hernia & referring her to Dr. Lyndel Safe in [place] as she has seen him in the past for endoscopies comment on her hiatal hernia and the prospect of surgical correction. I did caution the patient and her husband against potential for worsening of glaucoma and vision as well as urinary retention on Spiriva.

## 2014-10-23 NOTE — Telephone Encounter (Signed)
Spoke with pt, states she went to GI yesterday, was told that her hernia is not affecting her breathing.  Suggested that pt may need a thoracentesis, but did not get a cxr on pt.   Pt c/o somewhat worsening SOB, dysphagia, nonprod cough.  Pt uses [facility] in [place].   Dr. Ashok Cordia please advise.  Thanks!

## 2014-10-23 NOTE — Telephone Encounter (Signed)
Mindy... Please do the following.  1)  Send in a Rx to [facility] for her for Spiriva inhale 1 capsule daily #30 w/ 3 refills 2)  Put in a referral to Dr. Conni Slipper [phone] to evaluate her hiatal hernia - she has seen him before but only for a procedure. 3)  Order a CT Chest w/o for Cough/Dyspnea.  Thanks.

## 2014-10-24 ENCOUNTER — Ambulatory Visit (HOSPITAL_BASED_OUTPATIENT_CLINIC_OR_DEPARTMENT_OTHER): Payer: PPO

## 2014-10-24 VITALS — BP 129/90 | HR 98 | Temp 98.4°F | Resp 18

## 2014-10-24 DIAGNOSIS — Z85118 Personal history of other malignant neoplasm of bronchus and lung: Secondary | ICD-10-CM | POA: Diagnosis not present

## 2014-10-24 DIAGNOSIS — Z23 Encounter for immunization: Secondary | ICD-10-CM

## 2014-10-24 DIAGNOSIS — C3412 Malignant neoplasm of upper lobe, left bronchus or lung: Secondary | ICD-10-CM

## 2014-10-24 MED ORDER — INFLUENZA VAC SPLIT QUAD 0.5 ML IM SUSY
0.5000 mL | PREFILLED_SYRINGE | Freq: Once | INTRAMUSCULAR | Status: AC
  Administered 2014-10-24: 0.5 mL via INTRAMUSCULAR
  Filled 2014-10-24: qty 0.5

## 2014-10-24 MED ORDER — SODIUM CHLORIDE 0.9 % IJ SOLN
10.0000 mL | INTRAMUSCULAR | Status: DC | PRN
  Administered 2014-10-24: 10 mL via INTRAVENOUS
  Filled 2014-10-24: qty 10

## 2014-10-24 MED ORDER — HEPARIN SOD (PORK) LOCK FLUSH 100 UNIT/ML IV SOLN
500.0000 [IU] | Freq: Once | INTRAVENOUS | Status: AC | PRN
  Administered 2014-10-24: 500 [IU] via INTRAVENOUS
  Filled 2014-10-24: qty 5

## 2014-10-24 MED ORDER — TIOTROPIUM BROMIDE MONOHYDRATE 18 MCG IN CAPS: 18.0000 ug | ORAL_CAPSULE | Freq: Every day | RESPIRATORY_TRACT | Status: DC

## 2014-10-24 NOTE — Patient Instructions (Signed)
Implanted Port Home Guide An implanted port is a type of central line that is placed under the skin. Central lines are used to provide IV access when treatment or nutrition needs to be given through a person's veins. Implanted ports are used for long-term IV access. An implanted port may be placed because:   You need IV medicine that would be irritating to the small veins in your hands or arms.   You need long-term IV medicines, such as antibiotics.   You need IV nutrition for a long period.   You need frequent blood draws for lab tests.   You need dialysis.  Implanted ports are usually placed in the chest area, but they can also be placed in the upper arm, the abdomen, or the leg. An implanted port has two main parts:   Reservoir. The reservoir is round and will appear as a small, raised area under your skin. The reservoir is the part where a needle is inserted to give medicines or draw blood.   Catheter. The catheter is a thin, flexible tube that extends from the reservoir. The catheter is placed into a large vein. Medicine that is inserted into the reservoir goes into the catheter and then into the vein.  HOW WILL I CARE FOR MY INCISION SITE? Do not get the incision site wet. Bathe or shower as directed by your health care Cella Cappello.  HOW IS MY PORT ACCESSED? Special steps must be taken to access the port:   Before the port is accessed, a numbing cream can be placed on the skin. This helps numb the skin over the port site.   Your health care Danecia Underdown uses a sterile technique to access the port.  Your health care Jeselle Hiser must put on a mask and sterile gloves.  The skin over your port is cleaned carefully with an antiseptic and allowed to dry.  The port is gently pinched between sterile gloves, and a needle is inserted into the port.  Only "non-coring" port needles should be used to access the port. Once the port is accessed, a blood return should be checked. This helps  ensure that the port is in the vein and is not clogged.   If your port needs to remain accessed for a constant infusion, a clear (transparent) bandage will be placed over the needle site. The bandage and needle will need to be changed every week, or as directed by your health care Eulon Allnutt.   Keep the bandage covering the needle clean and dry. Do not get it wet. Follow your health care Franshesca Chipman's instructions on how to take a shower or bath while the port is accessed.   If your port does not need to stay accessed, no bandage is needed over the port.  WHAT IS FLUSHING? Flushing helps keep the port from getting clogged. Follow your health care Abel Hageman's instructions on how and when to flush the port. Ports are usually flushed with saline solution or a medicine called heparin. The need for flushing will depend on how the port is used.   If the port is used for intermittent medicines or blood draws, the port will need to be flushed:   After medicines have been given.   After blood has been drawn.   As part of routine maintenance.   If a constant infusion is running, the port may not need to be flushed.  HOW LONG WILL MY PORT STAY IMPLANTED? The port can stay in for as long as your health care   Giulian Goldring thinks it is needed. When it is time for the port to come out, surgery will be done to remove it. The procedure is similar to the one performed when the port was put in.  WHEN SHOULD I SEEK IMMEDIATE MEDICAL CARE? When you have an implanted port, you should seek immediate medical care if:   You notice a bad smell coming from the incision site.   You have swelling, redness, or drainage at the incision site.   You have more swelling or pain at the port site or the surrounding area.   You have a fever that is not controlled with medicine. Document Released: 01/24/2005 Document Revised: 11/14/2012 Document Reviewed: 10/01/2012 [facility] Patient Information 2015 [place], [place]. This  information is not intended to replace advice given to you by your health care Annick Dimaio. Make sure you discuss any questions you have with your health care Stela Iwasaki. Influenza Virus Vaccine injection (Fluarix) What is this medicine? INFLUENZA VIRUS VACCINE (in floo EN zuh VAHY ruhs vak SEEN) helps to reduce the risk of getting influenza also known as the flu. This medicine may be used for other purposes; ask your health care Genesys Coggeshall or pharmacist if you have questions. COMMON BRAND NAME(S): Fluarix, Fluzone What should I tell my health care Park Beck before I take this medicine? They need to know if you have any of these conditions: -bleeding disorder like hemophilia -fever or infection -Guillain-Barre syndrome or other neurological problems -immune system problems -infection with the human immunodeficiency virus (HIV) or AIDS -low blood platelet counts -multiple sclerosis -an unusual or allergic reaction to influenza virus vaccine, eggs, chicken proteins, latex, gentamicin, other medicines, foods, dyes or preservatives -pregnant or trying to get pregnant -breast-feeding How should I use this medicine? This vaccine is for injection into a muscle. It is given by a health care professional. A copy of Vaccine Information Statements will be given before each vaccination. Read this sheet carefully each time. The sheet may change frequently. Talk to your pediatrician regarding the use of this medicine in children. Special care may be needed. Overdosage: If you think you have taken too much of this medicine contact a poison control center or emergency room at once. NOTE: This medicine is only for you. Do not share this medicine with others. What if I miss a dose? This does not apply. What may interact with this medicine? -chemotherapy or radiation therapy -medicines that lower your immune system like etanercept, anakinra, infliximab, and adalimumab -medicines that treat or prevent blood clots  like warfarin -phenytoin -steroid medicines like prednisone or cortisone -theophylline -vaccines This list may not describe all possible interactions. Give your health care Shigeo Baugh a list of all the medicines, herbs, non-prescription drugs, or dietary supplements you use. Also tell them if you smoke, drink alcohol, or use illegal drugs. Some items may interact with your medicine. What should I watch for while using this medicine? Report any side effects that do not go away within 3 days to your doctor or health care professional. Call your health care Hayslee Casebolt if any unusual symptoms occur within 6 weeks of receiving this vaccine. You may still catch the flu, but the illness is not usually as bad. You cannot get the flu from the vaccine. The vaccine will not protect against colds or other illnesses that may cause fever. The vaccine is needed every year. What side effects may I notice from receiving this medicine? Side effects that you should report to your doctor or health care professional as soon  as possible: -allergic reactions like skin rash, itching or hives, swelling of the face, lips, or tongue Side effects that usually do not require medical attention (report to your doctor or health care professional if they continue or are bothersome): -fever -headache -muscle aches and pains -pain, tenderness, redness, or swelling at site where injected -weak or tired This list may not describe all possible side effects. Call your doctor for medical advice about side effects. You may report side effects to FDA at 1-800-FDA-1088. Where should I keep my medicine? This vaccine is only given in a clinic, pharmacy, doctor's office, or other health care setting and will not be stored at home. NOTE: This sheet is a summary. It may not cover all possible information. If you have questions about this medicine, talk to your doctor, pharmacist, or health care Gatlin Kittell.  2015, Elsevier/Gold Standard.  (2007-08-22 09:30:40)

## 2014-10-24 NOTE — Telephone Encounter (Signed)
RX sent in, CT ordered, referral placed. Nothing further needed

## 2014-10-30 ENCOUNTER — Ambulatory Visit (INDEPENDENT_AMBULATORY_CARE_PROVIDER_SITE_OTHER)
Admission: RE | Admit: 2014-10-30 | Discharge: 2014-10-30 | Disposition: A | Discharge status: Home / Self Care | Payer: PPO | Source: Ambulatory Visit | Attending: Pulmonary Disease | Admitting: Pulmonary Disease

## 2014-10-30 ENCOUNTER — Telehealth: Payer: Self-pay | Admitting: Pulmonary Disease

## 2014-10-30 DIAGNOSIS — R0600 Dyspnea, unspecified: Secondary | ICD-10-CM | POA: Diagnosis not present

## 2014-10-30 NOTE — Telephone Encounter (Signed)
Called spoke with Stacy from [place]. She was calling with a call report of pt CT scan. Results are now in epic. Please advise Dr. Ashok Cordia thanks

## 2014-10-31 ENCOUNTER — Other Ambulatory Visit: Payer: Self-pay | Admitting: Hematology & Oncology

## 2014-10-31 ENCOUNTER — Telehealth: Payer: Self-pay | Admitting: *Deleted

## 2014-10-31 ENCOUNTER — Other Ambulatory Visit: Payer: Self-pay

## 2014-10-31 DIAGNOSIS — R918 Other nonspecific abnormal finding of lung field: Secondary | ICD-10-CM

## 2014-10-31 DIAGNOSIS — C3412 Malignant neoplasm of upper lobe, left bronchus or lung: Secondary | ICD-10-CM

## 2014-10-31 MED ORDER — TRAMADOL HCL 50 MG PO TABS: 50.0000 mg | ORAL_TABLET | Freq: Four times a day (QID) | ORAL | Status: DC | PRN

## 2014-10-31 NOTE — Telephone Encounter (Signed)
PET order. Thoracentesis is scheduled for 11/07/14 at 10am. Pt will be called thur with instructions from hospital Called pt and LMTCB x1

## 2014-10-31 NOTE — Telephone Encounter (Signed)
JN has already spoken with pt

## 2014-10-31 NOTE — Telephone Encounter (Signed)
[phone] please call her back at this number

## 2014-10-31 NOTE — Telephone Encounter (Signed)
Called spoke with pt. She is aware of below. She verbalized understanding and needed nothing further

## 2014-10-31 NOTE — Telephone Encounter (Signed)
-----   Message from Javier Glazier, MD sent at 10/31/2014  7:12 AM EDT ----- Regarding: PET CT I talked with [name] regarding her CT scan. Need to order a PET CT scan to evaluate her Right Lower Lobe Lung Mass. I'm going to talk with Marni Griffon today about setting her up for a thoracentesis next week. Thanks.

## 2014-11-06 ENCOUNTER — Ambulatory Visit (HOSPITAL_COMMUNITY)
Admission: RE | Admit: 2014-11-06 | Discharge: 2014-11-06 | Disposition: A | Discharge status: Home / Self Care | Payer: PPO | Source: Ambulatory Visit | Attending: Pulmonary Disease | Admitting: Pulmonary Disease

## 2014-11-06 DIAGNOSIS — R918 Other nonspecific abnormal finding of lung field: Secondary | ICD-10-CM | POA: Insufficient documentation

## 2014-11-06 DIAGNOSIS — R0600 Dyspnea, unspecified: Secondary | ICD-10-CM | POA: Insufficient documentation

## 2014-11-06 DIAGNOSIS — J90 Pleural effusion, not elsewhere classified: Secondary | ICD-10-CM | POA: Diagnosis not present

## 2014-11-06 DIAGNOSIS — C7951 Secondary malignant neoplasm of bone: Secondary | ICD-10-CM | POA: Diagnosis not present

## 2014-11-06 DIAGNOSIS — C801 Malignant (primary) neoplasm, unspecified: Secondary | ICD-10-CM | POA: Diagnosis not present

## 2014-11-06 DIAGNOSIS — R591 Generalized enlarged lymph nodes: Secondary | ICD-10-CM | POA: Insufficient documentation

## 2014-11-06 LAB — GLUCOSE, CAPILLARY: GLUCOSE-CAPILLARY: 80 mg/dL (ref 65–99)

## 2014-11-06 MED ORDER — FLUDEOXYGLUCOSE F - 18 (FDG) INJECTION
8.7900 | Freq: Once | INTRAVENOUS | Status: DC | PRN
  Administered 2014-11-06: 8.79 via INTRAVENOUS
  Filled 2014-11-06: qty 8.79

## 2014-11-07 ENCOUNTER — Other Ambulatory Visit: Payer: Self-pay | Admitting: Acute Care

## 2014-11-07 ENCOUNTER — Ambulatory Visit (HOSPITAL_BASED_OUTPATIENT_CLINIC_OR_DEPARTMENT_OTHER)
Admission: RE | Admit: 2014-11-07 | Discharge: 2014-11-07 | Disposition: A | Discharge status: Home / Self Care | Payer: PPO | Source: Ambulatory Visit | Attending: Pulmonary Disease | Admitting: Pulmonary Disease

## 2014-11-07 ENCOUNTER — Ambulatory Visit (HOSPITAL_COMMUNITY)
Admission: RE | Admit: 2014-11-07 | Discharge: 2014-11-07 | Disposition: A | Discharge status: Home / Self Care | Payer: PPO | Source: Ambulatory Visit | Attending: Acute Care | Admitting: Acute Care

## 2014-11-07 DIAGNOSIS — Z9889 Other specified postprocedural states: Secondary | ICD-10-CM

## 2014-11-07 DIAGNOSIS — R918 Other nonspecific abnormal finding of lung field: Secondary | ICD-10-CM | POA: Diagnosis not present

## 2014-11-07 LAB — BODY FLUID CELL COUNT WITH DIFFERENTIAL
EOS FL: 0 %
Lymphs, Fluid: 57 %
MONOCYTE-MACROPHAGE-SEROUS FLUID: 41 % — AB (ref 50–90)
Neutrophil Count, Fluid: 2 % (ref 0–25)
Total Nucleated Cell Count, Fluid: 1680 cu mm — ABNORMAL HIGH (ref 0–1000)

## 2014-11-07 LAB — LACTATE DEHYDROGENASE, PLEURAL OR PERITONEAL FLUID: LD, Fluid: 213 U/L — ABNORMAL HIGH (ref 3–23)

## 2014-11-07 LAB — PROTEIN, BODY FLUID: TOTAL PROTEIN, FLUID: 4.8 g/dL

## 2014-11-07 MED ORDER — KETOROLAC TROMETHAMINE 30 MG/ML IJ SOLN
30.0000 mg | INTRAMUSCULAR | Status: AC
  Administered 2014-11-07: 30 mg via INTRAMUSCULAR
  Filled 2014-11-07: qty 1

## 2014-11-07 NOTE — Progress Notes (Signed)
Patient admitted for thoracentesis.    Initial vitals: temp 97.5, HR 95, SpO2 98%, RR 18, BP 119/90  During the procedure vitals remained stable throughout.   No complications noted.  Discharge vitals: HR 86, SpO2 96%, RR 21, BP 120/88  Chest Xray results clear.  Patient discharged home with husband.

## 2014-11-07 NOTE — Procedures (Signed)
Thoracentesis Procedure Note  Pre-operative Diagnosis: thoracentesis   Post-operative Diagnosis: same  Indications: evaluation of pleural fluid and dyspnea   Procedure Details  Consent: Informed consent was obtained. Risks of the procedure were discussed including: infection, bleeding, pain, pneumothorax.  Under sterile conditions the patient was positioned. Betadine solution and sterile drapes were utilized.  1% buffered lidocaine was used to anesthetize the left  rib space which was identified via real time [place]. Fluid was obtained without any difficulties and minimal blood loss.  A dressing was applied to the wound and wound care instructions were provided.   Findings 1100 ml of cloudy pleural fluid was obtained. A sample was sent to Pathology for cytology, cell counts, as well as for infection analysis.  Complications:  Some pain towards the end of the procedure. Gave her 30 mg IM toradol.         Condition: stable  Plan A follow up chest x-ray was ordered. Bed Rest for 0 hours. PRN ibuprofen every 8 hours as needed

## 2014-11-08 LAB — PH, BODY FLUID: PH, BODY FLUID: 7.5

## 2014-11-08 LAB — RHEUMATOID FACTORS, FLUID: RHEUMATOID ARTHRITIS, QN/FLUID: NEGATIVE

## 2014-11-10 ENCOUNTER — Telehealth: Payer: Self-pay | Admitting: Pulmonary Disease

## 2014-11-10 LAB — BODY FLUID CULTURE: Culture: NO GROWTH

## 2014-11-10 MED ORDER — KETOROLAC TROMETHAMINE 10 MG PO TABS: 10.0000 mg | ORAL_TABLET | Freq: Four times a day (QID) | ORAL | Status: DC | PRN

## 2014-11-10 NOTE — Telephone Encounter (Signed)
Patient says she had Thoracentesis done on Friday morning.  Still having pain in lungs.  Patient says she has 4 pain pills left over out of 10 pills.   Patient also wants to know results of PET scan.  Patient is afraid to take the pain pills because she is afraid she will run out

## 2014-11-10 NOTE — Telephone Encounter (Signed)
The final analysis on the fluid is not back but we will be in touch when it is Ok to refill the ultram  The pet is very complex and nonspecifc and needs to be reviewed with the doctor who ordered it as by itself it means nothing so I'm not comfortable trying to interpret it for her. Will bring to Dr Ammie Dalton attention

## 2014-11-10 NOTE — Telephone Encounter (Signed)
Patient says that she spoke with Dr. Marin Olp and he already went over the PET scan results with her.  Patient has appointment with Dr. Marin Olp, she is wanting to get the information from the fluid as soon as possible.  Does she need to come in to see Dr. Ashok Cordia or does she just need to follow up with [place]?    Per Dr. Melvyn Novas, ok to refill Toradol (NOT TRAMADOL).. Patient gave incorrect information:  Pt states that she cannot take Tramadol, it makes her sick, she was given Toradol for her pain instead.  Called in RX. Patient notified. Nothing further needed.

## 2014-11-10 NOTE — Telephone Encounter (Signed)
Patient contacted regarding results of the PET CT scan. Scan shows many areas of hypermetabolic activity. Highly suspicious for malignancy. Patient had her pain medication refilled earlier today. Cough is improving. She is reporting some right mid back pain which is of unclear etiology and reviewing her PET/CT imaging. I did speak with Dr. Marin Olp today. Awaiting final pleural fluid cytology. We will contact the patient once this is available.

## 2014-11-10 NOTE — Telephone Encounter (Signed)
Dr. Melvyn Novas please advise as Dr. Ashok Cordia has not answered. Thanks.

## 2014-11-11 ENCOUNTER — Telehealth: Payer: Self-pay | Admitting: Pulmonary Disease

## 2014-11-11 LAB — MISC LABCORP TEST (SEND OUT): Labcorp test code: 827547

## 2014-11-11 NOTE — Telephone Encounter (Signed)
Spoke with the patient regarding the result of her biopsy showing Adenocarcinoma. Awaiting molecular studies.

## 2014-11-12 ENCOUNTER — Other Ambulatory Visit: Payer: Self-pay | Admitting: Hematology & Oncology

## 2014-11-12 ENCOUNTER — Ambulatory Visit (HOSPITAL_COMMUNITY): Payer: PPO

## 2014-11-12 DIAGNOSIS — C3412 Malignant neoplasm of upper lobe, left bronchus or lung: Secondary | ICD-10-CM

## 2014-11-12 LAB — ANA, BODY FLUID: ANTI-NUCLEAR AB, IGG: NOT DETECTED

## 2014-11-12 LAB — ADENOSIDE DEAMINASE, PLEURAL FL: ADENOSIDE DEAMINASE, PLEURAL FL: 1.6 U/L (ref 0.0–9.4)

## 2014-11-13 ENCOUNTER — Ambulatory Visit (HOSPITAL_BASED_OUTPATIENT_CLINIC_OR_DEPARTMENT_OTHER): Payer: PPO

## 2014-11-13 ENCOUNTER — Ambulatory Visit (HOSPITAL_BASED_OUTPATIENT_CLINIC_OR_DEPARTMENT_OTHER): Payer: PPO | Admitting: Hematology & Oncology

## 2014-11-13 ENCOUNTER — Encounter: Payer: Self-pay | Admitting: Hematology & Oncology

## 2014-11-13 ENCOUNTER — Other Ambulatory Visit (HOSPITAL_BASED_OUTPATIENT_CLINIC_OR_DEPARTMENT_OTHER): Payer: PPO

## 2014-11-13 VITALS — BP 133/88 | HR 92 | Temp 98.4°F | Resp 16 | Ht 61.0 in | Wt 132.0 lb

## 2014-11-13 DIAGNOSIS — C7989 Secondary malignant neoplasm of other specified sites: Secondary | ICD-10-CM | POA: Diagnosis not present

## 2014-11-13 DIAGNOSIS — C3430 Malignant neoplasm of lower lobe, unspecified bronchus or lung: Secondary | ICD-10-CM

## 2014-11-13 DIAGNOSIS — C3412 Malignant neoplasm of upper lobe, left bronchus or lung: Secondary | ICD-10-CM

## 2014-11-13 DIAGNOSIS — C3490 Malignant neoplasm of unspecified part of unspecified bronchus or lung: Secondary | ICD-10-CM | POA: Diagnosis not present

## 2014-11-13 DIAGNOSIS — J910 Malignant pleural effusion: Secondary | ICD-10-CM

## 2014-11-13 DIAGNOSIS — R05 Cough: Secondary | ICD-10-CM

## 2014-11-13 DIAGNOSIS — R64 Cachexia: Secondary | ICD-10-CM

## 2014-11-13 DIAGNOSIS — R053 Chronic cough: Secondary | ICD-10-CM

## 2014-11-13 LAB — CBC WITH DIFFERENTIAL (CANCER CENTER ONLY)
BASO#: 0.1 10*3/uL (ref 0.0–0.2)
BASO%: 1.2 % (ref 0.0–2.0)
EOS ABS: 0.1 10*3/uL (ref 0.0–0.5)
EOS%: 2 % (ref 0.0–7.0)
HCT: 37.3 % (ref 34.8–46.6)
HEMOGLOBIN: 12.4 g/dL (ref 11.6–15.9)
LYMPH#: 1.7 10*3/uL (ref 0.9–3.3)
LYMPH%: 26.5 % (ref 14.0–48.0)
MCH: 28.5 pg (ref 26.0–34.0)
MCHC: 33.2 g/dL (ref 32.0–36.0)
MCV: 86 fL (ref 81–101)
MONO#: 0.5 10*3/uL (ref 0.1–0.9)
MONO%: 8.3 % (ref 0.0–13.0)
NEUT%: 62 % (ref 39.6–80.0)
NEUTROS ABS: 4 10*3/uL (ref 1.5–6.5)
Platelets: 247 10*3/uL (ref 145–400)
RBC: 4.35 10*6/uL (ref 3.70–5.32)
RDW: 14.3 % (ref 11.1–15.7)
WBC: 6.5 10*3/uL (ref 3.9–10.0)

## 2014-11-13 LAB — CMP (CANCER CENTER ONLY)
ALK PHOS: 83 U/L (ref 26–84)
ALT: 18 U/L (ref 10–47)
AST: 20 U/L (ref 11–38)
Albumin: 3.7 g/dL (ref 3.3–5.5)
BILIRUBIN TOTAL: 0.5 mg/dL (ref 0.20–1.60)
BUN: 16 mg/dL (ref 7–22)
CO2: 26 mEq/L (ref 18–33)
CREATININE: 0.8 mg/dL (ref 0.6–1.2)
Calcium: 9.3 mg/dL (ref 8.0–10.3)
Chloride: 104 mEq/L (ref 98–108)
Glucose, Bld: 79 mg/dL (ref 73–118)
POTASSIUM: 3.6 meq/L (ref 3.3–4.7)
SODIUM: 137 meq/L (ref 128–145)
TOTAL PROTEIN: 6.7 g/dL (ref 6.4–8.1)

## 2014-11-13 MED ORDER — DRONABINOL 5 MG PO CAPS: 5.0000 mg | ORAL_CAPSULE | Freq: Two times a day (BID) | ORAL | Status: DC

## 2014-11-13 MED ORDER — MORPHINE SULFATE 4 MG/ML IJ SOLN
1.0000 mg | INTRAMUSCULAR | Status: DC | PRN
Start: 2014-11-13 — End: 2014-11-13
  Administered 2014-11-13: 1 mg via SUBCUTANEOUS
  Filled 2014-11-13: qty 1

## 2014-11-13 MED ORDER — CYANOCOBALAMIN 1000 MCG/ML IJ SOLN
1000.0000 ug | Freq: Once | INTRAMUSCULAR | Status: AC
  Administered 2014-11-13: 1000 ug via INTRAMUSCULAR

## 2014-11-13 MED ORDER — BUPRENORPHINE 15 MCG/HR TD PTWK: 1.0000 | MEDICATED_PATCH | TRANSDERMAL | Status: DC

## 2014-11-13 MED ORDER — OXYCODONE HCL 5 MG PO TABS: ORAL_TABLET | ORAL | Status: DC

## 2014-11-13 MED ORDER — HYDROCODONE-HOMATROPINE 5-1.5 MG/5ML PO SYRP: 5.0000 mL | ORAL_SOLUTION | Freq: Four times a day (QID) | ORAL | Status: DC | PRN

## 2014-11-13 MED ORDER — CYANOCOBALAMIN 1000 MCG/ML IJ SOLN
INTRAMUSCULAR | Status: AC
  Filled 2014-11-13: qty 1

## 2014-11-13 MED ORDER — MORPHINE SULFATE (PF) 4 MG/ML IV SOLN
INTRAVENOUS | Status: AC
  Filled 2014-11-13: qty 1

## 2014-11-13 NOTE — Patient Instructions (Signed)
Cyanocobalamin, Vitamin B12 injection What is this medicine? CYANOCOBALAMIN (sye an oh koe BAL a min) is a man made form of vitamin B12. Vitamin B12 is used in the growth of healthy blood cells, nerve cells, and proteins in the body. It also helps with the metabolism of fats and carbohydrates. This medicine is used to treat people who can not absorb vitamin B12. This medicine may be used for other purposes; ask your health care provider or pharmacist if you have questions. What should I tell my health care provider before I take this medicine? They need to know if you have any of these conditions: -kidney disease -Leber's disease -megaloblastic anemia -an unusual or allergic reaction to cyanocobalamin, cobalt, other medicines, foods, dyes, or preservatives -pregnant or trying to get pregnant -breast-feeding How should I use this medicine? This medicine is injected into a muscle or deeply under the skin. It is usually given by a health care professional in a clinic or doctor's office. However, your doctor may teach you how to inject yourself. Follow all instructions. Talk to your pediatrician regarding the use of this medicine in children. Special care may be needed. Overdosage: If you think you have taken too much of this medicine contact a poison control center or emergency room at once. NOTE: This medicine is only for you. Do not share this medicine with others. What if I miss a dose? If you are given your dose at a clinic or doctor's office, call to reschedule your appointment. If you give your own injections and you miss a dose, take it as soon as you can. If it is almost time for your next dose, take only that dose. Do not take double or extra doses. What may interact with this medicine? -colchicine -heavy alcohol intake This list may not describe all possible interactions. Give your health care provider a list of all the medicines, herbs, non-prescription drugs, or dietary supplements you  use. Also tell them if you smoke, drink alcohol, or use illegal drugs. Some items may interact with your medicine. What should I watch for while using this medicine? Visit your doctor or health care professional regularly. You may need blood work done while you are taking this medicine. You may need to follow a special diet. Talk to your doctor. Limit your alcohol intake and avoid smoking to get the best benefit. What side effects may I notice from receiving this medicine? Side effects that you should report to your doctor or health care professional as soon as possible: -allergic reactions like skin rash, itching or hives, swelling of the face, lips, or tongue -blue tint to skin -chest tightness, pain -difficulty breathing, wheezing -dizziness -red, swollen painful area on the leg Side effects that usually do not require medical attention (report to your doctor or health care professional if they continue or are bothersome): -diarrhea -headache This list may not describe all possible side effects. Call your doctor for medical advice about side effects. You may report side effects to FDA at 1-800-FDA-1088. Where should I keep my medicine? Keep out of the reach of children. Store at room temperature between 15 and 30 degrees C (59 and 85 degrees F). Protect from light. Throw away any unused medicine after the expiration date. NOTE: This sheet is a summary. It may not cover all possible information. If you have questions about this medicine, talk to your doctor, pharmacist, or health care provider.    2016, Elsevier/Gold Standard. (2007-05-07 22:10:20) Morphine injection solution What is this  medicine? MORPHINE (MOR feen) is a pain reliever. It is used to treat moderate to severe pain. This medicine may be used for other purposes; ask your health care provider or pharmacist if you have questions. What should I tell my health care provider before I take this medicine? They need to know if  you have any of these conditions: -brain tumor -drug abuse or addiction -head injury -heart disease -frequently drink alcohol containing drinks -intestinal disease -kidney disease or problems urinating -kyphoscoliosis -liver disease -lung or breathing disease, like asthma -seizures -taken an MAOI like Carbex, Eldepryl, Marplan, Nardil, or Parnate in last 14 days -an unusual or allergic reaction to morphine, other pain medicines, foods, dyes, or preservatives -pregnant or trying to get pregnant -breast-feeding How should I use this medicine? This medicine is for injection into a muscle, vein, or under the skin. It is usually given by a health care professional in a hospital or clinic setting. If you get this medicine at home, you will be taught how to prepare and give this medicine. Use exactly as directed. Take your medicine at regular intervals. Do not take your medicine more often than directed. Always look at your medicine before using it. Do not use the injection if its color is darker than pale yellow or if it is discolored in any other way. Do not use this medicine if it is cloudy, thickened, colored, or has solid particles in it. It is important that you put your used needles and syringes in a special sharps container. Do not put them in a trash can. If you do not have a sharps container, call your pharmacist or healthcare provider to get one. Talk to your pediatrician regarding the use of this medicine in children. Special care may be needed. Overdosage: If you think you have taken too much of this medicine contact a poison control center or emergency room at once. NOTE: This medicine is only for you. Do not share this medicine with others. What if I miss a dose? If you miss a dose, take it as soon as you can. If it is almost time for your next dose, take only that dose. Do not take double or extra doses. What may interact with this medicine? Do not take this medicine with any of  the following medications: -MAOIs like Carbex, Eldepryl, Marplan, Nardil, and Parnate This medicine may also interact with the following medications: -alcohol -antihistamines -barbiturates, like phenobarbital -medicines for depression, anxiety, or psychotic disturbances -medicines for sleep -muscle relaxants -naltrexone, naloxone -narcotic medicines (opiates) for pain -rifampin -tramadol This list may not describe all possible interactions. Give your health care provider a list of all the medicines, herbs, non-prescription drugs, or dietary supplements you use. Also tell them if you smoke, drink alcohol, or use illegal drugs. Some items may interact with your medicine. What should I watch for while using this medicine? Tell your doctor or health care professional if your pain does not go away, if it gets worse, or if you have new or a different type of pain. You may develop tolerance to the medicine. Tolerance means that you will need a higher dose of the medicine for pain relief. Tolerance is normal and is expected if you take this medicine for a long time. Do not suddenly stop taking your medicine because you may develop a severe reaction. Your body becomes used to the medicine. This does NOT mean you are addicted. Addiction is a behavior related to getting and using a drug for  a non-medical reason. If you have pain, you have a medical reason to take pain medicine. Your doctor will tell you how much medicine to take. If your doctor wants you to stop the medicine, the dose will be slowly lowered over time to avoid any side effects. You may get drowsy or dizzy. Do not drive, use machinery, or do anything that needs mental alertness until you know how this medicine affects you. Do not stand or sit up quickly, especially if you are an older patient. This reduces the risk of dizzy or fainting spells. Alcohol may interfere with the effect of this medicine. Avoid alcoholic drinks. There are different  types of narcotic medicines (opiates) for pain. If you take more than one type at the same time, you may have more side effects. Give your health care provider a list of all medicines you use. Your doctor will tell you how much medicine to take. Do not take more medicine than directed. Call emergency for help if you have problems breathing. This medicine will cause constipation. Try to have a bowel movement at least every 2 to 3 days. If you do not have a bowel movement for 3 days, call your doctor or health care professional. Your mouth may get dry. Drinking water, chewing sugarless gum, or sucking on hard candy may help. See your dentist every 6 months. What side effects may I notice from receiving this medicine? Side effects that you should report to your doctor or health care professional as soon as possible: -allergic reactions like skin rash, itching or hives, swelling of the face, lips, or tongue -breathing problems -change in the amount of urine -confusion -feeling faint or lightheaded -fever, chills -hallucinations -red or sore at the injection site -seizures -slow or fast heartbeat -unusually weak or tired Side effects that usually do not require medical attention (report to your doctor or health care professional if they continue or are bothersome): -constipation -dizziness -headache -nausea, vomiting -pinpoint pupils -sweating This list may not describe all possible side effects. Call your doctor for medical advice about side effects. You may report side effects to FDA at 1-800-FDA-1088. Where should I keep my medicine? Keep out of the reach of children. This medicine can be abused. Keep it in a safe place to protect it from theft. Do not share this medicine with anyone. Selling or giving away this medicine is dangerous and is against the law. If you are using this medicine at home, you will be instructed on how to store this medicine. Throw away any unused medicine after the  expiration date on the label. Discard unused medicine and used packaging carefully. Pets and children can be harmed if they find used or lost packages. NOTE: This sheet is a summary. It may not cover all possible information. If you have questions about this medicine, talk to your doctor, pharmacist, or health care provider.    2016, Elsevier/[employer] Standard. (2012-07-04 21:34:59)

## 2014-11-13 NOTE — Progress Notes (Signed)
Hematology and Oncology Follow Up Visit  PREEYA CLECKLEY [id number] 1950/05/31 64 y.o. 11/13/2014   Principle Diagnosis:   Metastatic adenocarcinoma the lung  Current Therapy:    Patient to start therapy with Carboplatin/Alimta/Avastin  Xgeva 120 mg subcutaneous every month     Interim History:  Ms. Betten is back for follow-up. Unfortunately, we now have documented that she has metastatic lung cancer. She was having more issues. She is having more shortness of breath. She was seen by Dr. Ashok Cordia of pulmonary medicine. He was incredibly astute and did a CT scan. She had a large left pleural effusion.  We had this drained. Showed a thoracentesis for this. The thoracentesis was done on September 30. Unfortunately, the pathology report ([id number]) showed adenocarcinoma. I spoke with the pathologist to have the fluid sent off for [facility] genetic testing.  She had a PET scan done. The PET scan, unfortunately, shows extensive metastatic disease with metabolic adenopathy in the neck, mediastinum, hilar nodes, and left axilla. She has bone metabolic disease. She does have a right lower lung abnormality with low-grade activity.  Again, we do not have the results back from genetic testing area did however, I think that she will become symptomatic with her disease and that we probably should get her started on chemotherapy until we get the results back.  She has a cough. She does have some pain in the left posterior shoulder and back. I suspect that this is all from her disease.  She has lost weight.  Her appetite is down a little bit. I gave her a prescription for Marinol (5 mg by mouth twice a day) to try to help with her appetite.  Overall, her performance status is ECOG 1.  She has a headache. We will get an MRI of the brain to make sure that there is nothing going on with metastatic disease to her brain.  Medications:  Current outpatient prescriptions:  .  aspirin 81 MG tablet,  Take 81 mg by mouth daily.  , Disp: , Rfl:  .  calcium-vitamin D (OSCAL) 250-125 MG-UNIT per tablet, Take 1 tablet by mouth daily.  , Disp: , Rfl:  .  Cholecalciferol (VITAMIN D) 2000 UNITS tablet, Take 2,000 Units by mouth daily., Disp: , Rfl:  .  clarithromycin (BIAXIN) 500 MG tablet, Take 500 mg by mouth 2 (two) times daily with a meal., Disp: , Rfl: 0 .  fexofenadine (ALLEGRA) 60 MG tablet, Take 60 mg by mouth 2 (two) times daily., Disp: , Rfl: 0 .  fish oil-omega-3 fatty acids 1000 MG capsule, Take 2 g by mouth daily.  , Disp: , Rfl:  .  KLOR-CON M20 20 MEQ tablet, TAKE 1 TABLET BY MOUTH DAILY, Disp: 30 tablet, Rfl: 3 .  lidocaine-prilocaine (EMLA) cream, Apply 1 application topically as needed.  , Disp: , Rfl:  .  losartan (COZAAR) 50 MG tablet, Take 50 mg by mouth daily. , Disp: , Rfl:  .  mometasone-formoterol (DULERA) 100-5 MCG/ACT AERO, Inhale 1 puff into the lungs 2 (two) times daily., Disp: , Rfl:  .  omeprazole (PRILOSEC) 40 MG capsule, Take 40 mg by mouth 2 (two) times daily. , Disp: , Rfl: 3 .  PROAIR RESPICLICK 767 (90 BASE) MCG/ACT AEPB, USE 2 PUFFS EVERY 4-6 HOURS, Disp: , Rfl: 0 .  PROVENTIL HFA 108 (90 BASE) MCG/ACT inhaler, as needed. , Disp: , Rfl:  .  sucralfate (CARAFATE) 1 GM/10ML suspension, Take 44m by mouth before each meal and bedtime  as needed for dyspepsia, Disp: 420 mL, Rfl: 1 .  SUMAtriptan (IMITREX) 100 MG tablet, Take 100 mg by mouth as needed. For H/A, Disp: , Rfl:  .  traMADol (ULTRAM) 50 MG tablet, Take 1 tablet (50 mg total) by mouth every 6 (six) hours as needed., Disp: 60 tablet, Rfl: 1 .  Buprenorphine (BUTRANS) 15 MCG/HR PTWK, Place 1 patch onto the skin once a week., Disp: 4 patch, Rfl: 0 .  dronabinol (MARINOL) 5 MG capsule, Take 1 capsule (5 mg total) by mouth 2 (two) times daily before a meal., Disp: 60 capsule, Rfl: 2 .  HYDROcodone-homatropine (HYCODAN) 5-1.5 MG/5ML syrup, Take 5 mLs by mouth every 6 (six) hours as needed for cough., Disp: 240 mL,  Rfl: 0 .  oxyCODONE (OXY IR/ROXICODONE) 5 MG immediate release tablet, Take 1-2 pills, if needed, every 4 hours for pain secondary to cancer., Disp: 90 tablet, Rfl: 0  Current facility-administered medications:  .  morphine 4 MG/ML injection 1 mg, 1 mg, Subcutaneous, Q1H PRN, Volanda Napoleon, MD, 1 mg at 11/13/14 1403  Facility-Administered Medications Ordered in Other Visits:  .  sodium chloride 0.9 % injection 10 mL, 10 mL, Intravenous, PRN, Golden Pop, FNP, 10 mL at 12/06/13 1149  Allergies:  Allergies  Allergen Reactions  . Demerol Other (See Comments)    Syncope  . Mefoxin [Cefoxitin Sodium In Dextrose]     rash  . Topamax Other (See Comments)    Had trouble walking  . Avelox [Moxifloxacin Hcl In Nacl]     Hypotension.    Past Medical History, Surgical history, Social history, and Family History were reviewed and updated.  Review of Systems: As above  Physical Exam:  height is '5\' 1"'$  (1.549 m) and weight is 132 lb (59.875 kg). Her oral temperature is 98.4 F (36.9 C). Her blood pressure is 133/88 and her pulse is 92. Her respiration is 16.   Wt Readings from Last 3 Encounters:  11/13/14 132 lb (59.875 kg)  10/02/14 135 lb (61.236 kg)  09/16/14 136 lb (61.689 kg)     Well-developed and well-nourished white female in no obvious distress. Head and neck exam shows no ocular or oral lesions. She has no palpable cervical or supraclavicular lymph nodes. Lungs are clear to percussion and auscultation on the right side. She has decreased breath sounds on the left side. . Cardiac exam regular rate and rhythm with no murmurs, rubs or bruits. Abdomen is soft. She has good bowel sounds. There is no fluid wave. There is no palpable liver or spleen tip. Back exam shows no tenderness over the spine, ribs or hips. Extremities shows no clubbing, cyanosis or edema. Neurological exam shows no focal neurological deficits.  Lab Results  Component Value Date   WBC 6.5 11/13/2014   HGB  12.4 11/13/2014   HCT 37.3 11/13/2014   MCV 86 11/13/2014   PLT 247 11/13/2014     Chemistry      Component Value Date/Time   NA 137 11/13/2014 1205   NA 137 06/16/2014 0934   K 3.6 11/13/2014 1205   K 3.9 06/16/2014 0934   CL 104 11/13/2014 1205   CL 104 06/16/2014 0934   CO2 26 11/13/2014 1205   CO2 24 06/16/2014 0934   BUN 16 11/13/2014 1205   BUN 15 06/16/2014 0934   CREATININE 0.8 11/13/2014 1205   CREATININE 0.66 06/16/2014 0934      Component Value Date/Time   CALCIUM 9.3 11/13/2014 1205  CALCIUM 9.1 06/16/2014 0934   ALKPHOS 83 11/13/2014 1205   ALKPHOS 55 06/16/2014 0934   AST 20 11/13/2014 1205   AST 15 06/16/2014 0934   ALT 18 11/13/2014 1205   ALT 16 06/16/2014 0934   BILITOT 0.50 11/13/2014 1205   BILITOT 0.6 06/16/2014 0934         Impression and Plan: Ms. Levels is a 64 year old white female. I initially saw her when she was first diagnosed with lung cancer back in October 2005. She's had a couple local recurrences that were treated with radiation and chemotherapy.  We now are in a whole different "ballgame". She now has widely metastatic disease. I'm just surprised by this because his been almost 4 years since we had a treat her for a recurrence.  Again, I have to hope that she has a genetic mutation that we can target with one of the new oral agents. However, is going to take another couple weeks before we get these results back.  I will go ahead and get her started on treatment with carboplatin/Alimta/Avastin. I think this is very reasonable.  I spoke to she and her family. I spent about an hour with them. I went over the side effects. I gave her a vitamin B 12 shot.  I also gave her a prescription for a Butrans patch. This is a 15 mg patch. She will put this on every 7 days. I also gave her prescription for oxycodone to take for pain. She had been taking Toradol which I want her to stop as this could affect her stomach.  She and her family  realizes that she is not curable. Hopefully, we can see a good response. She really has never had full dose chemotherapy. As such, I think we might be oh to get a response.  I will treat her with 2-3 cycles of chemotherapy and then repeat her PET scan. I think that we will know how well it is working by the fact that her pleural fluid on the left will not come back.  We will start next week.  If I do get results back from her genetic testing and we can use an oral agent, we will do this.   Volanda Napoleon, MD 10/6/20165:10 PM

## 2014-11-14 ENCOUNTER — Other Ambulatory Visit: Payer: Self-pay | Admitting: Nurse Practitioner

## 2014-11-14 ENCOUNTER — Ambulatory Visit: Payer: PPO | Admitting: Pulmonary Disease

## 2014-11-14 DIAGNOSIS — C3491 Malignant neoplasm of unspecified part of right bronchus or lung: Secondary | ICD-10-CM

## 2014-11-14 LAB — CEA: CEA: 605 ng/mL — ABNORMAL HIGH (ref 0.0–5.0)

## 2014-11-14 LAB — PREALBUMIN: PREALBUMIN: 20 mg/dL (ref 17–34)

## 2014-11-14 LAB — LACTATE DEHYDROGENASE: LDH: 170 U/L (ref 94–250)

## 2014-11-14 MED ORDER — LORAZEPAM 0.5 MG PO TABS: 0.5000 mg | ORAL_TABLET | Freq: Four times a day (QID) | ORAL | Status: DC | PRN

## 2014-11-14 MED ORDER — ONDANSETRON HCL 8 MG PO TABS: 8.0000 mg | ORAL_TABLET | Freq: Two times a day (BID) | ORAL | Status: DC

## 2014-11-14 MED ORDER — DEXAMETHASONE 4 MG PO TABS: ORAL_TABLET | ORAL | Status: DC

## 2014-11-14 MED ORDER — PROCHLORPERAZINE MALEATE 10 MG PO TABS: 10.0000 mg | ORAL_TABLET | Freq: Four times a day (QID) | ORAL | Status: DC | PRN

## 2014-11-14 MED ORDER — FOLIC ACID 1 MG PO TABS: 1.0000 mg | ORAL_TABLET | Freq: Every day | ORAL | Status: DC

## 2014-11-14 MED ORDER — LIDOCAINE-PRILOCAINE 2.5-2.5 % EX CREA: TOPICAL_CREAM | CUTANEOUS | Status: DC

## 2014-11-18 ENCOUNTER — Telehealth: Payer: Self-pay | Admitting: Hematology & Oncology

## 2014-11-18 ENCOUNTER — Ambulatory Visit (HOSPITAL_COMMUNITY)
Admission: RE | Admit: 2014-11-18 | Discharge: 2014-11-18 | Disposition: A | Discharge status: Home / Self Care | Payer: PPO | Source: Ambulatory Visit | Attending: Hematology & Oncology | Admitting: Hematology & Oncology

## 2014-11-18 DIAGNOSIS — G9389 Other specified disorders of brain: Secondary | ICD-10-CM | POA: Diagnosis not present

## 2014-11-18 DIAGNOSIS — C3430 Malignant neoplasm of lower lobe, unspecified bronchus or lung: Secondary | ICD-10-CM | POA: Diagnosis not present

## 2014-11-18 DIAGNOSIS — R05 Cough: Secondary | ICD-10-CM | POA: Diagnosis present

## 2014-11-18 DIAGNOSIS — R64 Cachexia: Secondary | ICD-10-CM | POA: Insufficient documentation

## 2014-11-18 DIAGNOSIS — R053 Chronic cough: Secondary | ICD-10-CM

## 2014-11-18 MED ORDER — GADOBENATE DIMEGLUMINE 529 MG/ML IV SOLN
15.0000 mL | Freq: Once | INTRAVENOUS | Status: AC | PRN
Start: 2014-11-18 — End: 2014-11-18
  Administered 2014-11-18: 12 mL via INTRAVENOUS

## 2014-11-18 NOTE — Telephone Encounter (Signed)
[employer] APPROVED [name] [id number] Visits: 12 Dates: 11/13/2014 - 05/12/2015 APPROVED [id number] Alimta [id number] Carbo [id number] Avastin NPR [id number] Auth Nbr: [id number] Visits: 12 Dates: 11/13/2014 - 05/12/2015 APPROVED [id number] Decadron     COPY SCANNED

## 2014-11-18 NOTE — Telephone Encounter (Signed)
[employer] APPROVAL    Dates: 11/17/2014 - 05/16/2015   DRONABINOL 5 MG ONDANSETRON HCL '8MG'$    P: [phone] F: [phone]   COPY SCANNED

## 2014-11-19 ENCOUNTER — Ambulatory Visit (HOSPITAL_BASED_OUTPATIENT_CLINIC_OR_DEPARTMENT_OTHER): Payer: PPO

## 2014-11-19 ENCOUNTER — Other Ambulatory Visit: Payer: Self-pay | Admitting: Hematology & Oncology

## 2014-11-19 VITALS — BP 119/69 | HR 92 | Temp 97.7°F | Resp 16

## 2014-11-19 DIAGNOSIS — C3490 Malignant neoplasm of unspecified part of unspecified bronchus or lung: Secondary | ICD-10-CM

## 2014-11-19 DIAGNOSIS — Z5111 Encounter for antineoplastic chemotherapy: Secondary | ICD-10-CM | POA: Diagnosis not present

## 2014-11-19 DIAGNOSIS — F0631 Mood disorder due to known physiological condition with depressive features: Secondary | ICD-10-CM

## 2014-11-19 DIAGNOSIS — C7989 Secondary malignant neoplasm of other specified sites: Secondary | ICD-10-CM | POA: Diagnosis not present

## 2014-11-19 DIAGNOSIS — C3491 Malignant neoplasm of unspecified part of right bronchus or lung: Secondary | ICD-10-CM

## 2014-11-19 MED ORDER — PEMETREXED DISODIUM CHEMO INJECTION 500 MG
380.0000 mg/m2 | Freq: Once | INTRAVENOUS | Status: AC
  Administered 2014-11-19: 600 mg via INTRAVENOUS
  Filled 2014-11-19: qty 24

## 2014-11-19 MED ORDER — SODIUM CHLORIDE 0.9 % IV SOLN
Freq: Once | INTRAVENOUS | Status: AC
  Administered 2014-11-19: 12:00:00 via INTRAVENOUS
  Filled 2014-11-19: qty 8

## 2014-11-19 MED ORDER — SODIUM CHLORIDE 0.9 % IV SOLN
Freq: Once | INTRAVENOUS | Status: AC
  Administered 2014-11-19: 11:00:00 via INTRAVENOUS

## 2014-11-19 MED ORDER — SODIUM CHLORIDE 0.9 % IJ SOLN
10.0000 mL | INTRAMUSCULAR | Status: DC | PRN
  Administered 2014-11-19: 10 mL
  Filled 2014-11-19: qty 10

## 2014-11-19 MED ORDER — HEPARIN SOD (PORK) LOCK FLUSH 100 UNIT/ML IV SOLN
500.0000 [IU] | Freq: Once | INTRAVENOUS | Status: AC | PRN
  Administered 2014-11-19: 500 [IU]
  Filled 2014-11-19: qty 5

## 2014-11-19 MED ORDER — CARBOPLATIN CHEMO INJECTION 450 MG/45ML
350.0000 mg | Freq: Once | INTRAVENOUS | Status: AC
  Administered 2014-11-19: 350 mg via INTRAVENOUS
  Filled 2014-11-19: qty 35

## 2014-11-19 MED ORDER — CITALOPRAM HYDROBROMIDE 20 MG PO TABS
20.0000 mg | ORAL_TABLET | Freq: Every day | ORAL | Status: DC
Start: 2014-11-19 — End: 2015-04-04

## 2014-11-19 NOTE — Patient Instructions (Addendum)
[facility] Discharge Instructions for Patients Receiving Chemotherapy  Today you received the following chemotherapy agents  carboplatin and Alimta  To help prevent nausea and vomiting after your treatment, we encourage you to take your nausea medication as prescribed    If you develop nausea and vomiting that is not controlled by your nausea medication, call the clinic. If it is after clinic hours your family physician or the after hours number for the clinic or go to the Emergency Department.   BELOW ARE SYMPTOMS THAT SHOULD BE REPORTED IMMEDIATELY:  *FEVER GREATER THAN 100.5 F  *CHILLS WITH OR WITHOUT FEVER  NAUSEA AND VOMITING THAT IS NOT CONTROLLED WITH YOUR NAUSEA MEDICATION  *UNUSUAL SHORTNESS OF BREATH  *UNUSUAL BRUISING OR BLEEDING  TENDERNESS IN MOUTH AND THROAT WITH OR WITHOUT PRESENCE OF ULCERS  *URINARY PROBLEMS  *BOWEL PROBLEMS  UNUSUAL RASH Items with * indicate a potential emergency and should be followed up as soon as possible.  One of the nurses will contact you 24 hours after your treatment. Please let the nurse know about any problems that you may have experienced. Feel free to call the clinic you have any questions or concerns. The clinic phone number is [phone].   I have been informed and understand all the instructions given to me. I know to contact the clinic, my physician, or go to the Emergency Department if any problems should occur. I do not have any questions at this time, but understand that I may call the clinic during office hours   should I have any questions or need assistance in obtaining follow up care.    __________________________________________  _____________  __________ Signature of Patient or Authorized Representative            Date                   Time    __________________________________________ Nurse's Signature  Pemetrexed injection What is this medicine? PEMETREXED (PEM e TREX ed) is a  chemotherapy drug. This medicine affects cells that are rapidly growing, such as cancer cells and cells in your mouth and stomach. It is usually used to treat lung cancers like non-small cell lung cancer and mesothelioma. It may also be used to treat other cancers. This medicine may be used for other purposes; ask your health care provider or pharmacist if you have questions. What should I tell my health care provider before I take this medicine? They need to know if you have any of these conditions: -if you frequently drink alcohol containing beverages -infection (especially a virus infection such as chickenpox, cold sores, or herpes) -kidney disease -liver disease -low blood counts, like low platelets, red bloods, or white blood cells -an unusual or allergic reaction to pemetrexed, mannitol, other medicines, foods, dyes, or preservatives -pregnant or trying to get pregnant -breast-feeding How should I use this medicine? This drug is given as an infusion into a vein. It is administered in a hospital or clinic by a specially trained health care professional. Talk to your pediatrician regarding the use of this medicine in children. Special care may be needed. Overdosage: If you think you have taken too much of this medicine contact a poison control center or emergency room at once. NOTE: This medicine is only for you. Do not share this medicine with others. What if I miss a dose? It is important not to miss your dose. Call your doctor or health care professional if you are unable to keep  an appointment. What may interact with this medicine? -aspirin and aspirin-like medicines -medicines to increase blood counts like filgrastim, pegfilgrastim, sargramostim -methotrexate -NSAIDS, medicines for pain and inflammation, like ibuprofen or naproxen -probenecid -pyrimethamine -vaccines Talk to your doctor or health care professional before taking any of these  medicines: -acetaminophen -aspirin -ibuprofen -ketoprofen -naproxen This list may not describe all possible interactions. Give your health care provider a list of all the medicines, herbs, non-prescription drugs, or dietary supplements you use. Also tell them if you smoke, drink alcohol, or use illegal drugs. Some items may interact with your medicine. What should I watch for while using this medicine? Visit your doctor for checks on your progress. This drug may make you feel generally unwell. This is not uncommon, as chemotherapy can affect healthy cells as well as cancer cells. Report any side effects. Continue your course of treatment even though you feel ill unless your doctor tells you to stop. In some cases, you may be given additional medicines to help with side effects. Follow all directions for their use. Call your doctor or health care professional for advice if you get a fever, chills or sore throat, or other symptoms of a cold or flu. Do not treat yourself. This drug decreases your body's ability to fight infections. Try to avoid being around people who are sick. This medicine may increase your risk to bruise or bleed. Call your doctor or health care professional if you notice any unusual bleeding. Be careful brushing and flossing your teeth or using a toothpick because you may get an infection or bleed more easily. If you have any dental work done, tell your dentist you are receiving this medicine. Avoid taking products that contain aspirin, acetaminophen, ibuprofen, naproxen, or ketoprofen unless instructed by your doctor. These medicines may hide a fever. Call your doctor or health care professional if you get diarrhea or mouth sores. Do not treat yourself. To protect your kidneys, drink water or other fluids as directed while you are taking this medicine. Men and women must use effective birth control while taking this medicine. You may also need to continue using effective birth  control for a time after stopping this medicine. Do not become pregnant while taking this medicine. Tell your doctor right away if you think that you or your partner might be pregnant. There is a potential for serious side effects to an unborn child. Talk to your health care professional or pharmacist for more information. Do not breast-feed an infant while taking this medicine. This medicine may lower sperm counts. What side effects may I notice from receiving this medicine? Side effects that you should report to your doctor or health care professional as soon as possible: -allergic reactions like skin rash, itching or hives, swelling of the face, lips, or tongue -low blood counts - this medicine may decrease the number of white blood cells, red blood cells and platelets. You may be at increased risk for infections and bleeding. -signs of infection - fever or chills, cough, sore throat, pain or difficulty passing urine -signs of decreased platelets or bleeding - bruising, pinpoint red spots on the skin, black, tarry stools, blood in the urine -signs of decreased red blood cells - unusually weak or tired, fainting spells, lightheadedness -breathing problems, like a dry cough -changes in emotions or moods -chest pain -confusion -diarrhea -high blood pressure -mouth or throat sores or ulcers -pain, swelling, warmth in the leg -pain on swallowing -swelling of the ankles, feet, hands -trouble passing  urine or change in the amount of urine -vomiting -yellowing of the eyes or skin Side effects that usually do not require medical attention (report to your doctor or health care professional if they continue or are bothersome): -hair loss -loss of appetite -nausea -stomach upset This list may not describe all possible side effects. Call your doctor for medical advice about side effects. You may report side effects to FDA at 1-800-FDA-1088. Where should I keep my medicine? This drug is given in a  hospital or clinic and will not be stored at home. NOTE: This sheet is a summary. It may not cover all possible information. If you have questions about this medicine, talk to your doctor, pharmacist, or health care provider.    2016, Elsevier/Gold Standard. (2007-08-28 13:24:03)     Carboplatin injection What is this medicine? CARBOPLATIN (KAR boe pla tin) is a chemotherapy drug. It targets fast dividing cells, like cancer cells, and causes these cells to die. This medicine is used to treat ovarian cancer and many other cancers. This medicine may be used for other purposes; ask your health care provider or pharmacist if you have questions. What should I tell my health care provider before I take this medicine? They need to know if you have any of these conditions: -blood disorders -hearing problems -kidney disease -recent or ongoing radiation therapy -an unusual or allergic reaction to carboplatin, cisplatin, other chemotherapy, other medicines, foods, dyes, or preservatives -pregnant or trying to get pregnant -breast-feeding How should I use this medicine? This drug is usually given as an infusion into a vein. It is administered in a hospital or clinic by a specially trained health care professional. Talk to your pediatrician regarding the use of this medicine in children. Special care may be needed. Overdosage: If you think you have taken too much of this medicine contact a poison control center or emergency room at once. NOTE: This medicine is only for you. Do not share this medicine with others. What if I miss a dose? It is important not to miss a dose. Call your doctor or health care professional if you are unable to keep an appointment. What may interact with this medicine? -medicines for seizures -medicines to increase blood counts like filgrastim, pegfilgrastim, sargramostim -some antibiotics like amikacin, gentamicin, neomycin, streptomycin, tobramycin -vaccines Talk to  your doctor or health care professional before taking any of these medicines: -acetaminophen -aspirin -ibuprofen -ketoprofen -naproxen This list may not describe all possible interactions. Give your health care provider a list of all the medicines, herbs, non-prescription drugs, or dietary supplements you use. Also tell them if you smoke, drink alcohol, or use illegal drugs. Some items may interact with your medicine. What should I watch for while using this medicine? Your condition will be monitored carefully while you are receiving this medicine. You will need important blood work done while you are taking this medicine. This drug may make you feel generally unwell. This is not uncommon, as chemotherapy can affect healthy cells as well as cancer cells. Report any side effects. Continue your course of treatment even though you feel ill unless your doctor tells you to stop. In some cases, you may be given additional medicines to help with side effects. Follow all directions for their use. Call your doctor or health care professional for advice if you get a fever, chills or sore throat, or other symptoms of a cold or flu. Do not treat yourself. This drug decreases your body's ability to fight infections. Try  to avoid being around people who are sick. This medicine may increase your risk to bruise or bleed. Call your doctor or health care professional if you notice any unusual bleeding. Be careful brushing and flossing your teeth or using a toothpick because you may get an infection or bleed more easily. If you have any dental work done, tell your dentist you are receiving this medicine. Avoid taking products that contain aspirin, acetaminophen, ibuprofen, naproxen, or ketoprofen unless instructed by your doctor. These medicines may hide a fever. Do not become pregnant while taking this medicine. Women should inform their doctor if they wish to become pregnant or think they might be pregnant. There is a  potential for serious side effects to an unborn child. Talk to your health care professional or pharmacist for more information. Do not breast-feed an infant while taking this medicine. What side effects may I notice from receiving this medicine? Side effects that you should report to your doctor or health care professional as soon as possible: -allergic reactions like skin rash, itching or hives, swelling of the face, lips, or tongue -signs of infection - fever or chills, cough, sore throat, pain or difficulty passing urine -signs of decreased platelets or bleeding - bruising, pinpoint red spots on the skin, black, tarry stools, nosebleeds -signs of decreased red blood cells - unusually weak or tired, fainting spells, lightheadedness -breathing problems -changes in hearing -changes in vision -chest pain -high blood pressure -low blood counts - This drug may decrease the number of white blood cells, red blood cells and platelets. You may be at increased risk for infections and bleeding. -nausea and vomiting -pain, swelling, redness or irritation at the injection site -pain, tingling, numbness in the hands or feet -problems with balance, talking, walking -trouble passing urine or change in the amount of urine Side effects that usually do not require medical attention (report to your doctor or health care professional if they continue or are bothersome): -hair loss -loss of appetite -metallic taste in the mouth or changes in taste This list may not describe all possible side effects. Call your doctor for medical advice about side effects. You may report side effects to FDA at 1-800-FDA-1088. Where should I keep my medicine? This drug is given in a hospital or clinic and will not be stored at home. NOTE: This sheet is a summary. It may not cover all possible information. If you have questions about this medicine, talk to your doctor, pharmacist, or health care provider.    2016,  [employer]/Gold Standard. (2007-05-01 14:38:05)

## 2014-11-20 ENCOUNTER — Telehealth: Payer: Self-pay | Admitting: *Deleted

## 2014-11-20 ENCOUNTER — Other Ambulatory Visit: Payer: Self-pay | Admitting: *Deleted

## 2014-11-20 MED ORDER — GABAPENTIN 300 MG PO CAPS: 300.0000 mg | ORAL_CAPSULE | Freq: Three times a day (TID) | ORAL | Status: DC

## 2014-11-20 NOTE — Telephone Encounter (Signed)
Patient called stating that her pain is still severe in her shoulder.  Dr. Marin Olp saw her yesterday and was aware. She is taking one Oxycodone every 4 hours.  Told patient she could take 2 Oxycodone every 4 hours.  Waiting on the Butrans patch to be approved.  Dr. Marin Olp ordered Neurontin 300 tid .

## 2014-11-24 DIAGNOSIS — R55 Syncope and collapse: Secondary | ICD-10-CM | POA: Insufficient documentation

## 2014-11-24 DIAGNOSIS — I10 Essential (primary) hypertension: Secondary | ICD-10-CM | POA: Insufficient documentation

## 2014-11-24 DIAGNOSIS — K5900 Constipation, unspecified: Secondary | ICD-10-CM | POA: Insufficient documentation

## 2014-11-24 DIAGNOSIS — G43909 Migraine, unspecified, not intractable, without status migrainosus: Secondary | ICD-10-CM | POA: Insufficient documentation

## 2014-11-24 DIAGNOSIS — F32A Depression, unspecified: Secondary | ICD-10-CM | POA: Insufficient documentation

## 2014-11-24 DIAGNOSIS — Z8673 Personal history of transient ischemic attack (TIA), and cerebral infarction without residual deficits: Secondary | ICD-10-CM | POA: Insufficient documentation

## 2014-11-24 DIAGNOSIS — R112 Nausea with vomiting, unspecified: Secondary | ICD-10-CM | POA: Insufficient documentation

## 2014-11-24 DIAGNOSIS — F419 Anxiety disorder, unspecified: Secondary | ICD-10-CM | POA: Insufficient documentation

## 2014-11-24 DIAGNOSIS — J90 Pleural effusion, not elsewhere classified: Secondary | ICD-10-CM | POA: Insufficient documentation

## 2014-11-24 DIAGNOSIS — F329 Major depressive disorder, single episode, unspecified: Secondary | ICD-10-CM | POA: Insufficient documentation

## 2014-11-24 DIAGNOSIS — C3480 Malignant neoplasm of overlapping sites of unspecified bronchus and lung: Secondary | ICD-10-CM | POA: Insufficient documentation

## 2014-11-26 ENCOUNTER — Encounter (HOSPITAL_COMMUNITY): Payer: Self-pay

## 2014-11-26 DIAGNOSIS — A4151 Sepsis due to Escherichia coli [E. coli]: Secondary | ICD-10-CM | POA: Insufficient documentation

## 2014-11-27 DIAGNOSIS — R0789 Other chest pain: Secondary | ICD-10-CM | POA: Insufficient documentation

## 2014-12-05 LAB — FUNGUS CULTURE W SMEAR: FUNGAL SMEAR: NONE SEEN

## 2014-12-08 ENCOUNTER — Telehealth: Payer: Self-pay | Admitting: *Deleted

## 2014-12-08 NOTE — Telephone Encounter (Signed)
Patient wants office to know that she has been discharged from the hospital. Also asking about potential for oral chemo versus the systemic chemo she is currently receiving. Spoke to Dr Marin Olp who states that unfortunately patient doesn't have the necessary gene mutation for the oral therapy. Patient aware. She will follow up this Wednesday at her regularly scheduled appointment.

## 2014-12-10 ENCOUNTER — Other Ambulatory Visit: Payer: Self-pay | Admitting: Hematology & Oncology

## 2014-12-10 ENCOUNTER — Ambulatory Visit (HOSPITAL_BASED_OUTPATIENT_CLINIC_OR_DEPARTMENT_OTHER)
Admission: RE | Admit: 2014-12-10 | Discharge: 2014-12-10 | Disposition: A | Discharge status: Home / Self Care | Payer: PPO | Source: Ambulatory Visit | Attending: Hematology & Oncology | Admitting: Hematology & Oncology

## 2014-12-10 ENCOUNTER — Ambulatory Visit (HOSPITAL_BASED_OUTPATIENT_CLINIC_OR_DEPARTMENT_OTHER): Payer: PPO

## 2014-12-10 ENCOUNTER — Other Ambulatory Visit (HOSPITAL_BASED_OUTPATIENT_CLINIC_OR_DEPARTMENT_OTHER): Payer: PPO

## 2014-12-10 ENCOUNTER — Ambulatory Visit (HOSPITAL_BASED_OUTPATIENT_CLINIC_OR_DEPARTMENT_OTHER): Payer: PPO | Admitting: Hematology & Oncology

## 2014-12-10 ENCOUNTER — Encounter: Payer: Self-pay | Admitting: Hematology & Oncology

## 2014-12-10 VITALS — BP 141/89 | HR 99 | Temp 98.1°F | Wt 128.0 lb

## 2014-12-10 DIAGNOSIS — C3491 Malignant neoplasm of unspecified part of right bronchus or lung: Secondary | ICD-10-CM

## 2014-12-10 DIAGNOSIS — C3490 Malignant neoplasm of unspecified part of unspecified bronchus or lung: Secondary | ICD-10-CM

## 2014-12-10 DIAGNOSIS — K5901 Slow transit constipation: Secondary | ICD-10-CM

## 2014-12-10 DIAGNOSIS — J9811 Atelectasis: Secondary | ICD-10-CM | POA: Diagnosis not present

## 2014-12-10 DIAGNOSIS — Z5111 Encounter for antineoplastic chemotherapy: Secondary | ICD-10-CM

## 2014-12-10 DIAGNOSIS — J90 Pleural effusion, not elsewhere classified: Secondary | ICD-10-CM | POA: Diagnosis not present

## 2014-12-10 DIAGNOSIS — C3431 Malignant neoplasm of lower lobe, right bronchus or lung: Secondary | ICD-10-CM

## 2014-12-10 DIAGNOSIS — H6092 Unspecified otitis externa, left ear: Secondary | ICD-10-CM

## 2014-12-10 DIAGNOSIS — R0602 Shortness of breath: Secondary | ICD-10-CM | POA: Diagnosis present

## 2014-12-10 LAB — CBC WITH DIFFERENTIAL (CANCER CENTER ONLY)
BASO#: 0 10*3/uL (ref 0.0–0.2)
BASO%: 0.4 % (ref 0.0–2.0)
EOS%: 0 % (ref 0.0–7.0)
Eosinophils Absolute: 0 10*3/uL (ref 0.0–0.5)
HEMATOCRIT: 35.4 % (ref 34.8–46.6)
HEMOGLOBIN: 11.6 g/dL (ref 11.6–15.9)
LYMPH#: 1.1 10*3/uL (ref 0.9–3.3)
LYMPH%: 10.9 % — ABNORMAL LOW (ref 14.0–48.0)
MCH: 28 pg (ref 26.0–34.0)
MCHC: 32.8 g/dL (ref 32.0–36.0)
MCV: 86 fL (ref 81–101)
MONO#: 0.7 10*3/uL (ref 0.1–0.9)
MONO%: 7.1 % (ref 0.0–13.0)
NEUT%: 81.6 % — ABNORMAL HIGH (ref 39.6–80.0)
NEUTROS ABS: 8.2 10*3/uL — AB (ref 1.5–6.5)
Platelets: 481 10*3/uL — ABNORMAL HIGH (ref 145–400)
RBC: 4.14 10*6/uL (ref 3.70–5.32)
RDW: 15.5 % (ref 11.1–15.7)
WBC: 10 10*3/uL (ref 3.9–10.0)

## 2014-12-10 LAB — CMP (CANCER CENTER ONLY)
ALT(SGPT): 17 U/L (ref 10–47)
AST: 19 U/L (ref 11–38)
Albumin: 3.4 g/dL (ref 3.3–5.5)
Alkaline Phosphatase: 101 U/L — ABNORMAL HIGH (ref 26–84)
BUN: 23 mg/dL — AB (ref 7–22)
CHLORIDE: 102 meq/L (ref 98–108)
CO2: 23 meq/L (ref 18–33)
CREATININE: 0.7 mg/dL (ref 0.6–1.2)
Calcium: 8.9 mg/dL (ref 8.0–10.3)
GLUCOSE: 115 mg/dL (ref 73–118)
POTASSIUM: 4.2 meq/L (ref 3.3–4.7)
SODIUM: 139 meq/L (ref 128–145)
TOTAL PROTEIN: 7.5 g/dL (ref 6.4–8.1)
Total Bilirubin: 0.5 mg/dl (ref 0.20–1.60)

## 2014-12-10 LAB — TECHNOLOGIST REVIEW CHCC SATELLITE

## 2014-12-10 MED ORDER — HEPARIN SOD (PORK) LOCK FLUSH 100 UNIT/ML IV SOLN
500.0000 [IU] | Freq: Once | INTRAVENOUS | Status: AC | PRN
  Administered 2014-12-10: 500 [IU]
  Filled 2014-12-10: qty 5

## 2014-12-10 MED ORDER — SODIUM CHLORIDE 0.9 % IV SOLN
375.0000 mg/m2 | Freq: Once | INTRAVENOUS | Status: AC
  Administered 2014-12-10: 600 mg via INTRAVENOUS
  Filled 2014-12-10: qty 24

## 2014-12-10 MED ORDER — LACTULOSE 10 GM/15ML PO SOLN: ORAL | Status: DC

## 2014-12-10 MED ORDER — NEOMYCIN-POLYMYXIN-HC 3.5-10000-1 OT SOLN: 3.0000 [drp] | Freq: Four times a day (QID) | OTIC | Status: DC

## 2014-12-10 MED ORDER — SODIUM CHLORIDE 0.9 % IV SOLN
Freq: Once | INTRAVENOUS | Status: AC
  Administered 2014-12-10: 15:00:00 via INTRAVENOUS

## 2014-12-10 MED ORDER — SODIUM CHLORIDE 0.9 % IV SOLN
Freq: Once | INTRAVENOUS | Status: AC
  Administered 2014-12-10: 15:00:00 via INTRAVENOUS
  Filled 2014-12-10: qty 8

## 2014-12-10 MED ORDER — SODIUM CHLORIDE 0.9 % IV SOLN
350.0000 mg | Freq: Once | INTRAVENOUS | Status: AC
  Administered 2014-12-10: 350 mg via INTRAVENOUS
  Filled 2014-12-10: qty 35

## 2014-12-10 MED ORDER — SODIUM CHLORIDE 0.9 % IJ SOLN
10.0000 mL | INTRAMUSCULAR | Status: DC | PRN
  Administered 2014-12-10: 10 mL
  Filled 2014-12-10: qty 10

## 2014-12-10 NOTE — Patient Instructions (Signed)
[name] Discharge Instructions for Patients Receiving Chemotherapy  Today you received the following chemotherapy agents  carboplatin and Alimta  To help prevent nausea and vomiting after your treatment, we encourage you to take your nausea medication as prescribed    If you develop nausea and vomiting that is not controlled by your nausea medication, call the clinic. If it is after clinic hours your family physician or the after hours number for the clinic or go to the Emergency Department.   BELOW ARE SYMPTOMS THAT SHOULD BE REPORTED IMMEDIATELY:  *FEVER GREATER THAN 100.5 F  *CHILLS WITH OR WITHOUT FEVER  NAUSEA AND VOMITING THAT IS NOT CONTROLLED WITH YOUR NAUSEA MEDICATION  *UNUSUAL SHORTNESS OF BREATH  *UNUSUAL BRUISING OR BLEEDING  TENDERNESS IN MOUTH AND THROAT WITH OR WITHOUT PRESENCE OF ULCERS  *URINARY PROBLEMS  *BOWEL PROBLEMS  UNUSUAL RASH Items with * indicate a potential emergency and should be followed up as soon as possible.  One of the nurses will contact you 24 hours after your treatment. Please let the nurse know about any problems that you may have experienced. Feel free to call the clinic you have any questions or concerns. The clinic phone number is [phone].   I have been informed and understand all the instructions given to me. I know to contact the clinic, my physician, or go to the Emergency Department if any problems should occur. I do not have any questions at this time, but understand that I may call the clinic during office hours   should I have any questions or need assistance in obtaining follow up care.    __________________________________________  _____________  __________ Signature of Patient or Authorized Representative            Date                   Time    __________________________________________ Nurse's Signature  Pemetrexed injection What is this medicine? PEMETREXED (PEM e TREX ed) is a  chemotherapy drug. This medicine affects cells that are rapidly growing, such as cancer cells and cells in your mouth and stomach. It is usually used to treat lung cancers like non-small cell lung cancer and mesothelioma. It may also be used to treat other cancers. This medicine may be used for other purposes; ask your health care provider or pharmacist if you have questions. What should I tell my health care provider before I take this medicine? They need to know if you have any of these conditions: -if you frequently drink alcohol containing beverages -infection (especially a virus infection such as chickenpox, cold sores, or herpes) -kidney disease -liver disease -low blood counts, like low platelets, red bloods, or white blood cells -an unusual or allergic reaction to pemetrexed, mannitol, other medicines, foods, dyes, or preservatives -pregnant or trying to get pregnant -breast-feeding How should I use this medicine? This drug is given as an infusion into a vein. It is administered in a hospital or clinic by a specially trained health care professional. Talk to your pediatrician regarding the use of this medicine in children. Special care may be needed. Overdosage: If you think you have taken too much of this medicine contact a poison control center or emergency room at once. NOTE: This medicine is only for you. Do not share this medicine with others. What if I miss a dose? It is important not to miss your dose. Call your doctor or health care professional if you are unable to keep  an appointment. What may interact with this medicine? -aspirin and aspirin-like medicines -medicines to increase blood counts like filgrastim, pegfilgrastim, sargramostim -methotrexate -NSAIDS, medicines for pain and inflammation, like ibuprofen or naproxen -probenecid -pyrimethamine -vaccines Talk to your doctor or health care professional before taking any of these  medicines: -acetaminophen -aspirin -ibuprofen -ketoprofen -naproxen This list may not describe all possible interactions. Give your health care provider a list of all the medicines, herbs, non-prescription drugs, or dietary supplements you use. Also tell them if you smoke, drink alcohol, or use illegal drugs. Some items may interact with your medicine. What should I watch for while using this medicine? Visit your doctor for checks on your progress. This drug may make you feel generally unwell. This is not uncommon, as chemotherapy can affect healthy cells as well as cancer cells. Report any side effects. Continue your course of treatment even though you feel ill unless your doctor tells you to stop. In some cases, you may be given additional medicines to help with side effects. Follow all directions for their use. Call your doctor or health care professional for advice if you get a fever, chills or sore throat, or other symptoms of a cold or flu. Do not treat yourself. This drug decreases your body's ability to fight infections. Try to avoid being around people who are sick. This medicine may increase your risk to bruise or bleed. Call your doctor or health care professional if you notice any unusual bleeding. Be careful brushing and flossing your teeth or using a toothpick because you may get an infection or bleed more easily. If you have any dental work done, tell your dentist you are receiving this medicine. Avoid taking products that contain aspirin, acetaminophen, ibuprofen, naproxen, or ketoprofen unless instructed by your doctor. These medicines may hide a fever. Call your doctor or health care professional if you get diarrhea or mouth sores. Do not treat yourself. To protect your kidneys, drink water or other fluids as directed while you are taking this medicine. Men and women must use effective birth control while taking this medicine. You may also need to continue using effective birth  control for a time after stopping this medicine. Do not become pregnant while taking this medicine. Tell your doctor right away if you think that you or your partner might be pregnant. There is a potential for serious side effects to an unborn child. Talk to your health care professional or pharmacist for more information. Do not breast-feed an infant while taking this medicine. This medicine may lower sperm counts. What side effects may I notice from receiving this medicine? Side effects that you should report to your doctor or health care professional as soon as possible: -allergic reactions like skin rash, itching or hives, swelling of the face, lips, or tongue -low blood counts - this medicine may decrease the number of white blood cells, red blood cells and platelets. You may be at increased risk for infections and bleeding. -signs of infection - fever or chills, cough, sore throat, pain or difficulty passing urine -signs of decreased platelets or bleeding - bruising, pinpoint red spots on the skin, black, tarry stools, blood in the urine -signs of decreased red blood cells - unusually weak or tired, fainting spells, lightheadedness -breathing problems, like a dry cough -changes in emotions or moods -chest pain -confusion -diarrhea -high blood pressure -mouth or throat sores or ulcers -pain, swelling, warmth in the leg -pain on swallowing -swelling of the ankles, feet, hands -trouble passing  urine or change in the amount of urine -vomiting -yellowing of the eyes or skin Side effects that usually do not require medical attention (report to your doctor or health care professional if they continue or are bothersome): -hair loss -loss of appetite -nausea -stomach upset This list may not describe all possible side effects. Call your doctor for medical advice about side effects. You may report side effects to FDA at 1-800-FDA-1088. Where should I keep my medicine? This drug is given in a  hospital or clinic and will not be stored at home. NOTE: This sheet is a summary. It may not cover all possible information. If you have questions about this medicine, talk to your doctor, pharmacist, or health care provider.    2016, Elsevier/Gold Standard. (2007-08-28 13:24:03)     Carboplatin injection What is this medicine? CARBOPLATIN (KAR boe pla tin) is a chemotherapy drug. It targets fast dividing cells, like cancer cells, and causes these cells to die. This medicine is used to treat ovarian cancer and many other cancers. This medicine may be used for other purposes; ask your health care provider or pharmacist if you have questions. What should I tell my health care provider before I take this medicine? They need to know if you have any of these conditions: -blood disorders -hearing problems -kidney disease -recent or ongoing radiation therapy -an unusual or allergic reaction to carboplatin, cisplatin, other chemotherapy, other medicines, foods, dyes, or preservatives -pregnant or trying to get pregnant -breast-feeding How should I use this medicine? This drug is usually given as an infusion into a vein. It is administered in a hospital or clinic by a specially trained health care professional. Talk to your pediatrician regarding the use of this medicine in children. Special care may be needed. Overdosage: If you think you have taken too much of this medicine contact a poison control center or emergency room at once. NOTE: This medicine is only for you. Do not share this medicine with others. What if I miss a dose? It is important not to miss a dose. Call your doctor or health care professional if you are unable to keep an appointment. What may interact with this medicine? -medicines for seizures -medicines to increase blood counts like filgrastim, pegfilgrastim, sargramostim -some antibiotics like amikacin, gentamicin, neomycin, streptomycin, tobramycin -vaccines Talk to  your doctor or health care professional before taking any of these medicines: -acetaminophen -aspirin -ibuprofen -ketoprofen -naproxen This list may not describe all possible interactions. Give your health care provider a list of all the medicines, herbs, non-prescription drugs, or dietary supplements you use. Also tell them if you smoke, drink alcohol, or use illegal drugs. Some items may interact with your medicine. What should I watch for while using this medicine? Your condition will be monitored carefully while you are receiving this medicine. You will need important blood work done while you are taking this medicine. This drug may make you feel generally unwell. This is not uncommon, as chemotherapy can affect healthy cells as well as cancer cells. Report any side effects. Continue your course of treatment even though you feel ill unless your doctor tells you to stop. In some cases, you may be given additional medicines to help with side effects. Follow all directions for their use. Call your doctor or health care professional for advice if you get a fever, chills or sore throat, or other symptoms of a cold or flu. Do not treat yourself. This drug decreases your body's ability to fight infections. Try  to avoid being around people who are sick. This medicine may increase your risk to bruise or bleed. Call your doctor or health care professional if you notice any unusual bleeding. Be careful brushing and flossing your teeth or using a toothpick because you may get an infection or bleed more easily. If you have any dental work done, tell your dentist you are receiving this medicine. Avoid taking products that contain aspirin, acetaminophen, ibuprofen, naproxen, or ketoprofen unless instructed by your doctor. These medicines may hide a fever. Do not become pregnant while taking this medicine. Women should inform their doctor if they wish to become pregnant or think they might be pregnant. There is a  potential for serious side effects to an unborn child. Talk to your health care professional or pharmacist for more information. Do not breast-feed an infant while taking this medicine. What side effects may I notice from receiving this medicine? Side effects that you should report to your doctor or health care professional as soon as possible: -allergic reactions like skin rash, itching or hives, swelling of the face, lips, or tongue -signs of infection - fever or chills, cough, sore throat, pain or difficulty passing urine -signs of decreased platelets or bleeding - bruising, pinpoint red spots on the skin, black, tarry stools, nosebleeds -signs of decreased red blood cells - unusually weak or tired, fainting spells, lightheadedness -breathing problems -changes in hearing -changes in vision -chest pain -high blood pressure -low blood counts - This drug may decrease the number of white blood cells, red blood cells and platelets. You may be at increased risk for infections and bleeding. -nausea and vomiting -pain, swelling, redness or irritation at the injection site -pain, tingling, numbness in the hands or feet -problems with balance, talking, walking -trouble passing urine or change in the amount of urine Side effects that usually do not require medical attention (report to your doctor or health care professional if they continue or are bothersome): -hair loss -loss of appetite -metallic taste in the mouth or changes in taste This list may not describe all possible side effects. Call your doctor for medical advice about side effects. You may report side effects to FDA at 1-800-FDA-1088. Where should I keep my medicine? This drug is given in a hospital or clinic and will not be stored at home. NOTE: This sheet is a summary. It may not cover all possible information. If you have questions about this medicine, talk to your doctor, pharmacist, or health care provider.    2016,  Elsevier/Gold Standard. (2007-05-01 14:38:05)

## 2014-12-11 ENCOUNTER — Telehealth: Payer: Self-pay | Admitting: *Deleted

## 2014-12-11 NOTE — Telephone Encounter (Signed)
Patient states that today she has been restless. She's not sleeping. Feels 'fidgety' and shaky. She is unable to relax. Reviewed meds with her. She is not taking her prescription decadron, but did receive a '20mg'$  IV dose yesterday during treatment. Reviewed symptoms with Dr Marin Olp. Symptoms likely from steroids. Patient told to monitor symptoms. Patient told to call back tomorrow if symptoms persisted. She understood.

## 2014-12-11 NOTE — Progress Notes (Signed)
Hematology and Oncology Follow Up Visit  KANSAS SPAINHOWER [id number] 1950/09/28 64 y.o. 12/11/2014   Principle Diagnosis:   Metastatic adenocarcinoma the lung  Current Therapy:    Patient s/p cycle #1 of Carboplatin/Alimta/Avastin  Xgeva 120 mg subcutaneous every month     Interim History:  Ms. Aronov is back for follow-up. Unfortunately, she really had a tough time with the first cycle chemotherapy. She was hospitalized at [facility] exactly what happened. She thinks that she may have had syncopal episodes because of the Marinol.  When she was hospitalized, she required another thoracentesis for the left lung. A much or how much fluid was taken out.  She still has a lot of back pain. She cannot afford the Butrans patch. I will try her on a Lidoderm patch.  She's had a decreased appetite. She's had some nausea. She's had no diarrhea. There's been no leg swelling. She's had no rashes.  She has some facial pain.  She's had no bleeding.  We did get a chest x-ray on her today. This did not show any reaccumulation of the left pleural effusion. The right hilar mass was less obvious.  Overall, her performance status is ECOG 1.   We did do a MRI of the brain to make sure there is no metastatic disease. This turned out okay.   Medications:  Current outpatient prescriptions:  .  aspirin 81 MG tablet, Take 81 mg by mouth daily.  , Disp: , Rfl:  .  calcium-vitamin D (OSCAL) 250-125 MG-UNIT per tablet, Take 1 tablet by mouth daily.  , Disp: , Rfl:  .  Cholecalciferol (VITAMIN D) 2000 UNITS tablet, Take 2,000 Units by mouth daily., Disp: , Rfl:  .  citalopram (CELEXA) 20 MG tablet, Take 1 tablet (20 mg total) by mouth daily., Disp: 30 tablet, Rfl: 6 .  dexamethasone (DECADRON) 4 MG tablet, Take 1 tab two times a day the day before Alimta chemo. Take 2 tabs two times a day starting the day after chemo for 3 days., Disp: 30 tablet, Rfl: 1 .  fish oil-omega-3 fatty acids  1000 MG capsule, Take 2 g by mouth daily.  , Disp: , Rfl:  .  folic acid (FOLVITE) 1 MG tablet, Take 1 tablet (1 mg total) by mouth daily. Start 5-7 days before Alimta chemotherapy. Continue until 21 days after Alimta completed., Disp: 100 tablet, Rfl: 3 .  gabapentin (NEURONTIN) 300 MG capsule, Take 1 capsule (300 mg total) by mouth 3 (three) times daily., Disp: 90 capsule, Rfl: 2 .  KLOR-CON M20 20 MEQ tablet, TAKE 1 TABLET BY MOUTH DAILY, Disp: 30 tablet, Rfl: 3 .  lidocaine-prilocaine (EMLA) cream, Apply to affected area once, Disp: 30 g, Rfl: 3 .  LORazepam (ATIVAN) 0.5 MG tablet, Take 1 tablet (0.5 mg total) by mouth every 6 (six) hours as needed (Nausea or vomiting)., Disp: 30 tablet, Rfl: 0 .  mometasone-formoterol (DULERA) 100-5 MCG/ACT AERO, Inhale 1 puff into the lungs 2 (two) times daily., Disp: , Rfl:  .  omeprazole (PRILOSEC) 40 MG capsule, Take 40 mg by mouth 2 (two) times daily. , Disp: , Rfl: 3 .  ondansetron (ZOFRAN) 8 MG tablet, Take 1 tablet (8 mg total) by mouth 2 (two) times daily. Start the day after chemo for 3 days. Then take as needed for nausea or vomiting., Disp: 30 tablet, Rfl: 1 .  PROAIR RESPICLICK 706 (90 BASE) MCG/ACT AEPB, USE 2 PUFFS EVERY 4-6 HOURS, Disp: , Rfl: 0 .  prochlorperazine (COMPAZINE) 10 MG tablet, Take 1 tablet (10 mg total) by mouth every 6 (six) hours as needed (Nausea or vomiting)., Disp: 30 tablet, Rfl: 1 .  PROVENTIL HFA 108 (90 BASE) MCG/ACT inhaler, as needed. , Disp: , Rfl:  .  SUMAtriptan (IMITREX) 100 MG tablet, Take 100 mg by mouth as needed. For H/A, Disp: , Rfl:  .  lactulose (CHRONULAC) 10 GM/15ML solution, Take 2 tablespoons 3x a day., Disp: 1000 mL, Rfl: 6 .  neomycin-polymyxin-hydrocortisone (CORTISPORIN) otic solution, Place 3 drops into the left ear 4 (four) times daily., Disp: 10 mL, Rfl: 0 .  oxyCODONE (OXY IR/ROXICODONE) 5 MG immediate release tablet, Take 1-2 pills, if needed, every 4 hours for pain secondary to cancer., Disp: 90  tablet, Rfl: 0 No current facility-administered medications for this visit.  Facility-Administered Medications Ordered in Other Visits:  .  sodium chloride 0.9 % injection 10 mL, 10 mL, Intravenous, PRN, Golden Pop, FNP, 10 mL at 12/06/13 1149  Allergies:  Allergies  Allergen Reactions  . Demerol Other (See Comments)    Syncope  . Mefoxin [Cefoxitin Sodium In Dextrose]     rash  . Topamax Other (See Comments)    Had trouble walking  . Avelox [Moxifloxacin Hcl In Nacl]     Hypotension.    Past Medical History, Surgical history, Social history, and Family History were reviewed and updated.  Review of Systems: As above  Physical Exam:  weight is 128 lb (58.06 kg). Her oral temperature is 98.1 F (36.7 C). Her blood pressure is 141/89 and her pulse is 99.   Wt Readings from Last 3 Encounters:  12/10/14 128 lb (58.06 kg)  11/13/14 132 lb (59.875 kg)  10/02/14 135 lb (61.236 kg)     Well-developed and well-nourished white female in no obvious distress. Head and neck exam shows no ocular or oral lesions. She has no palpable cervical or supraclavicular lymph nodes. Lungs are clear to percussion and auscultation on the right side. She has decreased breath sounds on the left side at the base of the left lung.. . Cardiac exam regular rate and rhythm with no murmurs, rubs or bruits. Abdomen is soft. She has good bowel sounds. There is no fluid wave. There is no palpable liver or spleen tip. Back exam shows no tenderness over the spine, ribs or hips. Extremities shows no clubbing, cyanosis or edema. Neurological exam shows no focal neurological deficits.  Lab Results  Component Value Date   WBC 10.0 12/10/2014   HGB 11.6 12/10/2014   HCT 35.4 12/10/2014   MCV 86 12/10/2014   PLT 481* 12/10/2014     Chemistry      Component Value Date/Time   NA 139 12/10/2014 1248   NA 137 06/16/2014 0934   K 4.2 12/10/2014 1248   K 3.9 06/16/2014 0934   CL 102 12/10/2014 1248   CL 104  06/16/2014 0934   CO2 23 12/10/2014 1248   CO2 24 06/16/2014 0934   BUN 23* 12/10/2014 1248   BUN 15 06/16/2014 0934   CREATININE 0.7 12/10/2014 1248   CREATININE 0.66 06/16/2014 0934      Component Value Date/Time   CALCIUM 8.9 12/10/2014 1248   CALCIUM 9.1 06/16/2014 0934   ALKPHOS 101* 12/10/2014 1248   ALKPHOS 55 06/16/2014 0934   AST 19 12/10/2014 1248   AST 15 06/16/2014 0934   ALT 17 12/10/2014 1248   ALT 16 06/16/2014 0934   BILITOT 0.50 12/10/2014 1248   BILITOT  0.6 06/16/2014 0934         Impression and Plan: Ms. Eckles is a 64 year old white female. Hopefully, with some dosage readjustment, we will be able to get her feeling better.  I really want to try to do a treatment on her today. After this treatment, then we will repeat a PET scan and see how it looks.  I just feel bad for her as she has a low threshold for treatment side effects.  I know that she is trying very hard. Her quality of life is what's important for her. I totally agree with this.  I think if she cannot tolerate this When chemotherapy, then we may want to consider using immunotherapy with Nivolumab. I think the severe airways able.  I spent about 35 minutes with she and her husband today.  We will get her back in 3 weeks. Actually, I might make it 4 weeks so she can get through Thanksgiving without treatment.   Volanda Napoleon, MD 11/3/20165:12 PM

## 2014-12-12 ENCOUNTER — Other Ambulatory Visit: Payer: Self-pay

## 2014-12-12 MED ORDER — MEGESTROL ACETATE 400 MG/10ML PO SUSP: 800.0000 mg | Freq: Every day | ORAL | Status: DC

## 2014-12-15 ENCOUNTER — Telehealth: Payer: Self-pay

## 2014-12-15 NOTE — Telephone Encounter (Signed)
Received fax confirmation from [facility] stated that our request for pt's Megace '40mg'$ /ml has been approved from 12/15/14 through 02/07/15. dph

## 2014-12-20 IMAGING — CT CT CHEST W/ CM
2 of 3 series · 15 of 30 positions shown, 17 images · IV contrast (75CC OMNI 300)
Comparison: 09/03/2012

CLINICAL DATA: Bilateral lung cancer. The right lower lobe
pulmonary nodules.

EXAM:
CT CHEST WITH CONTRAST
TECHNIQUE: Multidetector CT imaging of the chest was performed during
intravenous contrast administration.
CONTRAST:  75mL OMNIPAQUE IOHEXOL 300 MG/ML  SOLN

[Series 3: chest with · axial · 0.62mm/px · z∈[-296,-81]mm · 7 of 59 slices shown, 9 images]
[im 8/59  mediastinal]
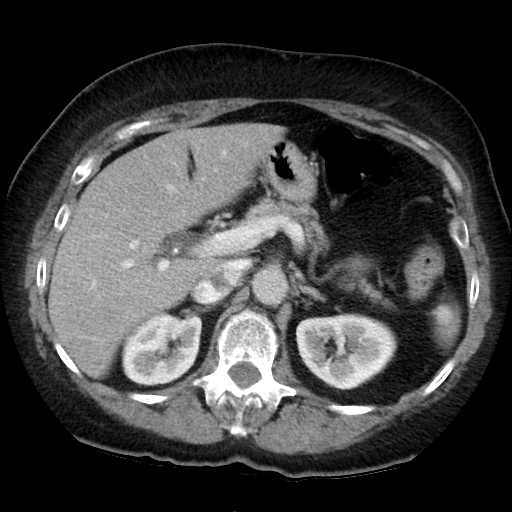
[im 8/59  lung]
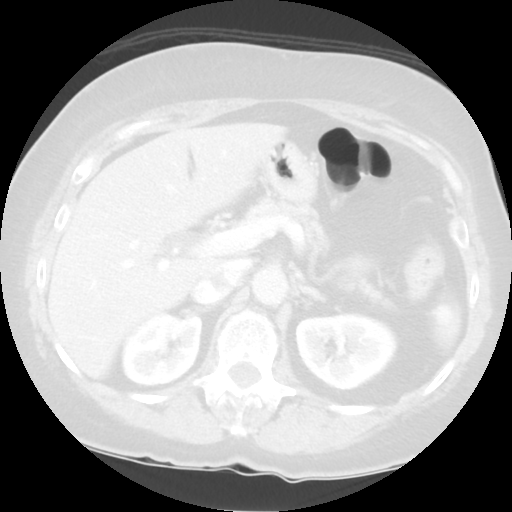
[im 15/59  lung]
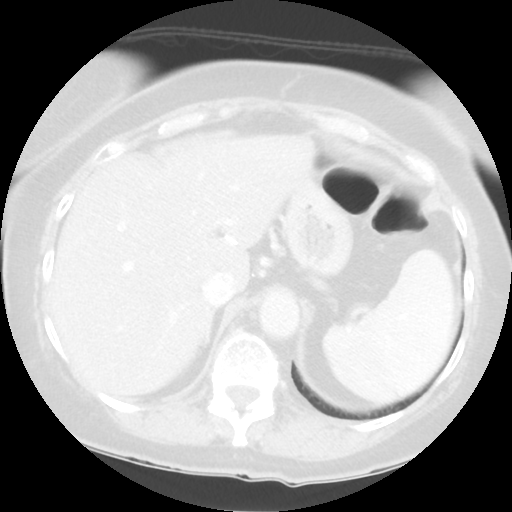
[im 22/59  lung]
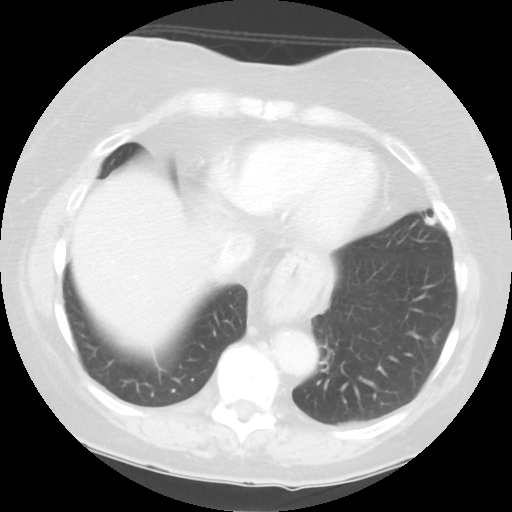
[im 30/59  lung]
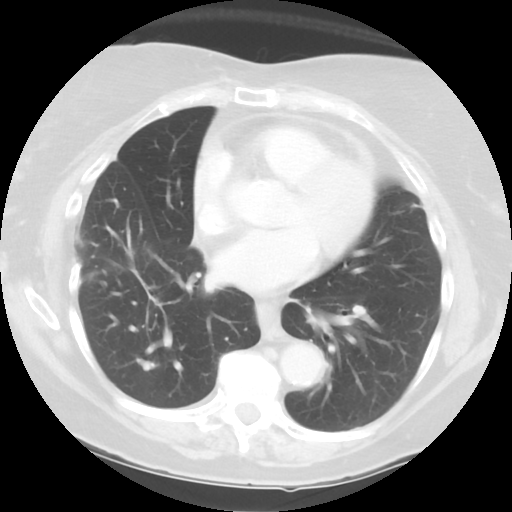
[im 37/59  mediastinal]
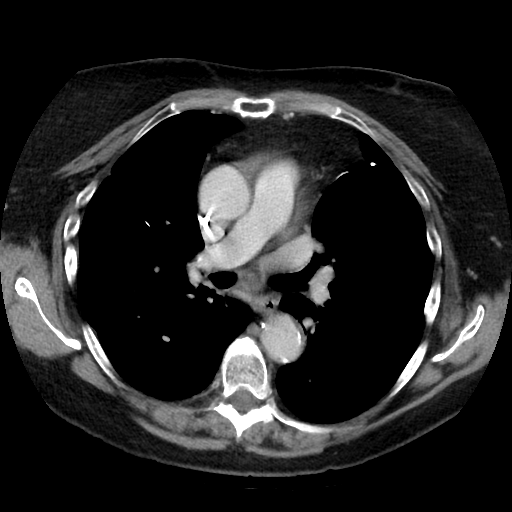
[im 37/59  lung]
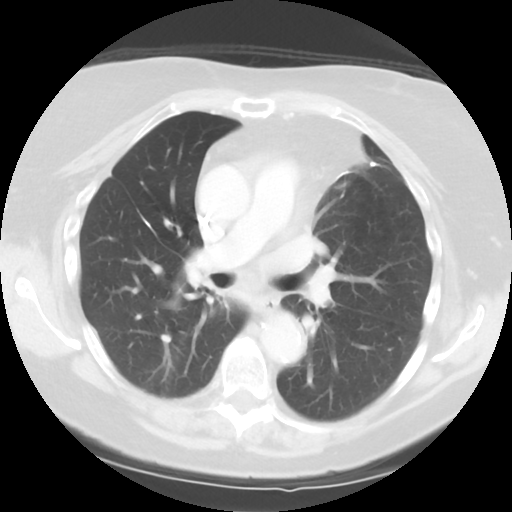
[im 44/59  lung]
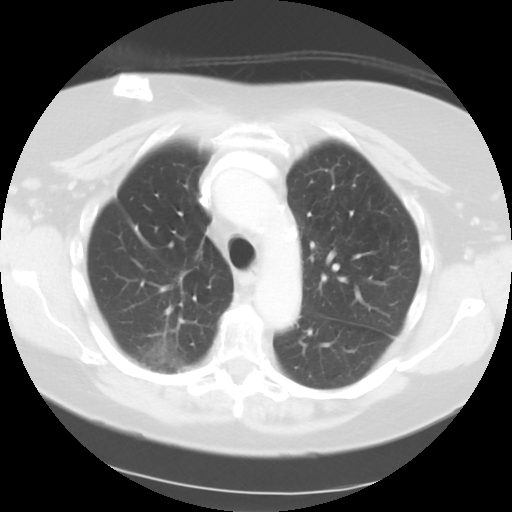
[im 51/59  lung]
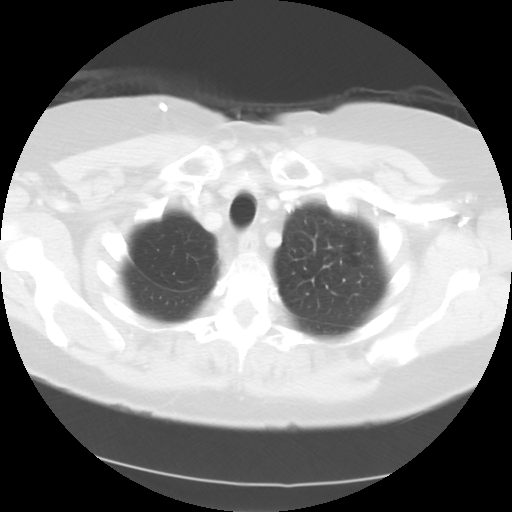

[Series 602: sagittal body · sagittal · 0.62mm/px · 8 of 129 slices shown]
[im 15/129  mediastinal]
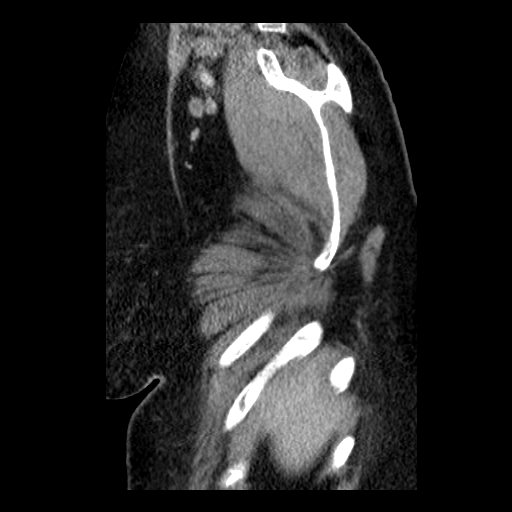
[im 29/129  mediastinal]
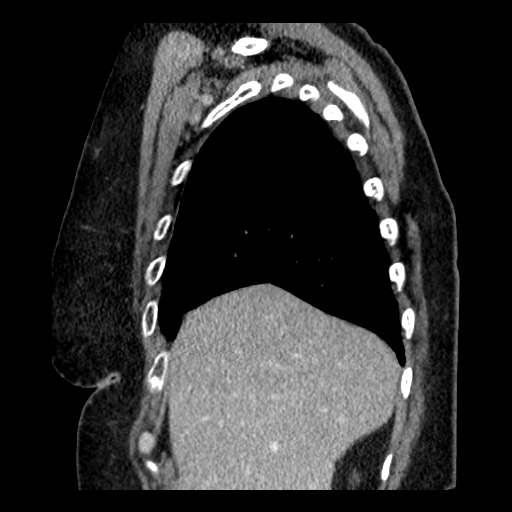
[im 43/129  mediastinal]
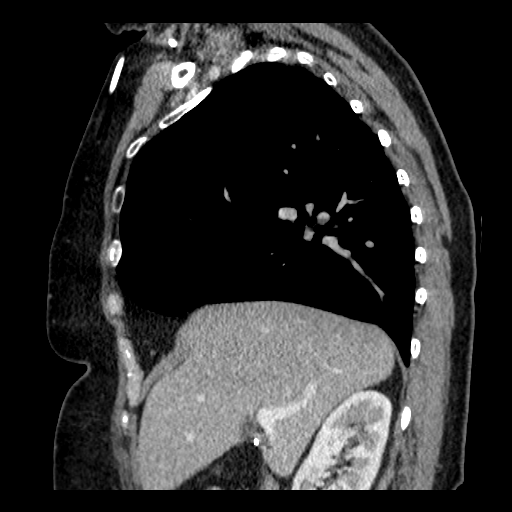
[im 57/129  mediastinal]
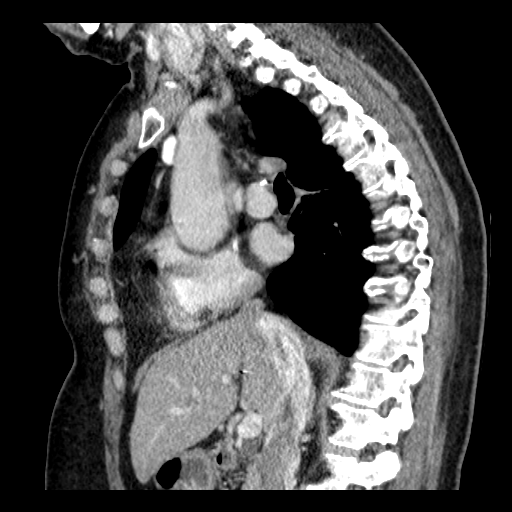
[im 72/129  mediastinal]
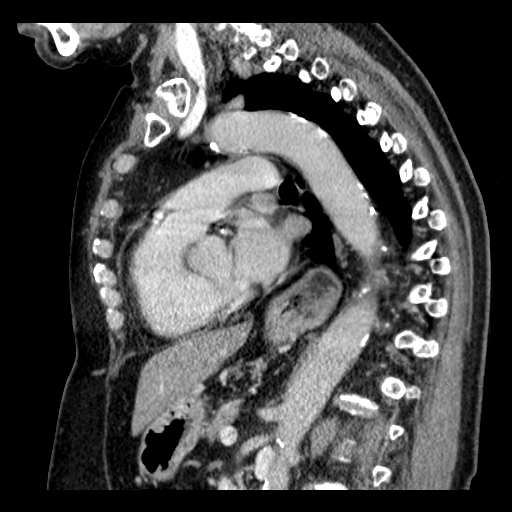
[im 86/129  mediastinal]
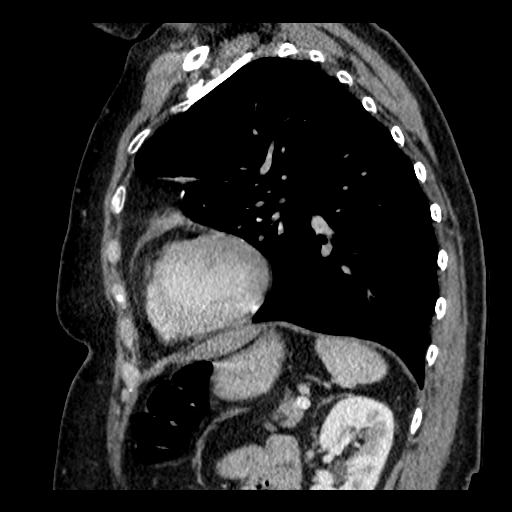
[im 100/129  mediastinal]
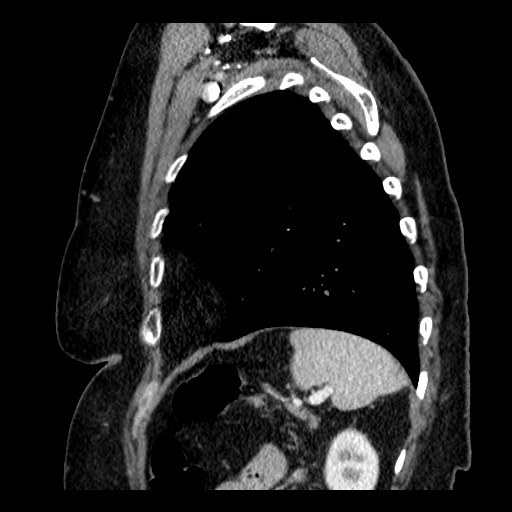
[im 114/129  mediastinal]
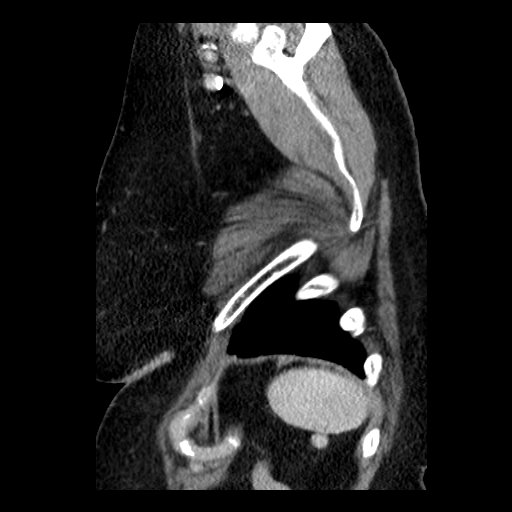

[15 of 30 positions shown; findings below may reference images not displayed]

FINDINGS: The tip of the right-sided Port-A-Cath projects in the distal SVC,
just proximal to the RA.

There is no axillary lymphadenopathy. No mediastinal or hilar
lymphadenopathy. Small hiatal hernia noted. Heart size is normal. No
pericardial or pleural effusion. Coronary artery calcification is
noted.

Emphysema is noted in the lungs bilaterally. As before, pulmonary
venous anatomy suggest prior right upper lobectomy. Suture material
with volume loss and scarring in the anterior left upper lobe
suggest previous wedge resection. Bronchiectasis with mass-like
scarring in the central aspect of the right lower lobe is also
stable.

In the periphery of the right lower lobe, a pair of small nodules
were identified on the previous study. These have resolved in the
interval.

No new or enlarging pulmonary nodular mass.

Bone windows reveal no worrisome lytic or sclerotic osseous lesions.
No evidence for adrenal nodule in the upper abdomen.
IMPRESSION: Interval resolution of the previously seen small nodular opacities
in the periphery of the right lung base.

Otherwise stable exam.  No new or progressive findings in the chest.

## 2014-12-21 LAB — AFB CULTURE WITH SMEAR (NOT AT ARMC): Acid Fast Smear: NONE SEEN

## 2014-12-25 ENCOUNTER — Ambulatory Visit: Payer: PPO | Admitting: Hematology & Oncology

## 2014-12-25 ENCOUNTER — Other Ambulatory Visit: Payer: PPO

## 2014-12-30 ENCOUNTER — Ambulatory Visit (HOSPITAL_COMMUNITY)
Admission: RE | Admit: 2014-12-30 | Discharge: 2014-12-30 | Disposition: A | Discharge status: Home / Self Care | Payer: PPO | Source: Ambulatory Visit | Attending: Hematology & Oncology | Admitting: Hematology & Oncology

## 2014-12-30 DIAGNOSIS — C3431 Malignant neoplasm of lower lobe, right bronchus or lung: Secondary | ICD-10-CM | POA: Diagnosis not present

## 2014-12-30 DIAGNOSIS — C7800 Secondary malignant neoplasm of unspecified lung: Secondary | ICD-10-CM | POA: Diagnosis not present

## 2014-12-30 DIAGNOSIS — J910 Malignant pleural effusion: Secondary | ICD-10-CM | POA: Insufficient documentation

## 2014-12-30 DIAGNOSIS — Z9221 Personal history of antineoplastic chemotherapy: Secondary | ICD-10-CM | POA: Insufficient documentation

## 2014-12-30 DIAGNOSIS — H6092 Unspecified otitis externa, left ear: Secondary | ICD-10-CM | POA: Insufficient documentation

## 2014-12-30 DIAGNOSIS — C7951 Secondary malignant neoplasm of bone: Secondary | ICD-10-CM | POA: Insufficient documentation

## 2014-12-30 DIAGNOSIS — Z0189 Encounter for other specified special examinations: Secondary | ICD-10-CM | POA: Insufficient documentation

## 2014-12-30 DIAGNOSIS — K5901 Slow transit constipation: Secondary | ICD-10-CM | POA: Diagnosis not present

## 2014-12-30 LAB — GLUCOSE, CAPILLARY: Glucose-Capillary: 87 mg/dL (ref 65–99)

## 2014-12-30 MED ORDER — FLUDEOXYGLUCOSE F - 18 (FDG) INJECTION
6.8300 | Freq: Once | INTRAVENOUS | Status: DC | PRN
  Administered 2014-12-30: 6.83 via INTRAVENOUS
  Filled 2014-12-30: qty 6.83

## 2014-12-31 ENCOUNTER — Ambulatory Visit (HOSPITAL_BASED_OUTPATIENT_CLINIC_OR_DEPARTMENT_OTHER): Payer: PPO | Admitting: Hematology & Oncology

## 2014-12-31 ENCOUNTER — Ambulatory Visit (HOSPITAL_BASED_OUTPATIENT_CLINIC_OR_DEPARTMENT_OTHER): Payer: PPO

## 2014-12-31 ENCOUNTER — Other Ambulatory Visit (HOSPITAL_BASED_OUTPATIENT_CLINIC_OR_DEPARTMENT_OTHER): Payer: PPO

## 2014-12-31 ENCOUNTER — Encounter: Payer: Self-pay | Admitting: Hematology & Oncology

## 2014-12-31 VITALS — BP 138/90 | HR 95 | Temp 97.6°F | Resp 14 | Ht 61.0 in | Wt 126.0 lb

## 2014-12-31 DIAGNOSIS — C3490 Malignant neoplasm of unspecified part of unspecified bronchus or lung: Secondary | ICD-10-CM | POA: Diagnosis not present

## 2014-12-31 DIAGNOSIS — Z5111 Encounter for antineoplastic chemotherapy: Secondary | ICD-10-CM | POA: Diagnosis not present

## 2014-12-31 DIAGNOSIS — C3491 Malignant neoplasm of unspecified part of right bronchus or lung: Secondary | ICD-10-CM

## 2014-12-31 DIAGNOSIS — C3432 Malignant neoplasm of lower lobe, left bronchus or lung: Secondary | ICD-10-CM

## 2014-12-31 LAB — CMP (CANCER CENTER ONLY)
ALT(SGPT): 14 U/L (ref 10–47)
AST: 17 U/L (ref 11–38)
Albumin: 3.3 g/dL (ref 3.3–5.5)
Alkaline Phosphatase: 77 U/L (ref 26–84)
BUN: 14 mg/dL (ref 7–22)
CALCIUM: 9.8 mg/dL (ref 8.0–10.3)
CHLORIDE: 102 meq/L (ref 98–108)
CO2: 28 mEq/L (ref 18–33)
Creat: 0.7 mg/dl (ref 0.6–1.2)
Glucose, Bld: 104 mg/dL (ref 73–118)
POTASSIUM: 3.7 meq/L (ref 3.3–4.7)
Sodium: 141 mEq/L (ref 128–145)
TOTAL PROTEIN: 8 g/dL (ref 6.4–8.1)
Total Bilirubin: 0.4 mg/dl (ref 0.20–1.60)

## 2014-12-31 LAB — CBC WITH DIFFERENTIAL (CANCER CENTER ONLY)
BASO#: 0 10*3/uL (ref 0.0–0.2)
BASO%: 0.4 % (ref 0.0–2.0)
EOS%: 0.1 % (ref 0.0–7.0)
Eosinophils Absolute: 0 10*3/uL (ref 0.0–0.5)
HCT: 33.1 % — ABNORMAL LOW (ref 34.8–46.6)
HGB: 10.7 g/dL — ABNORMAL LOW (ref 11.6–15.9)
LYMPH#: 1 10*3/uL (ref 0.9–3.3)
LYMPH%: 12.6 % — AB (ref 14.0–48.0)
MCH: 28.1 pg (ref 26.0–34.0)
MCHC: 32.3 g/dL (ref 32.0–36.0)
MCV: 87 fL (ref 81–101)
MONO#: 0.8 10*3/uL (ref 0.1–0.9)
MONO%: 9.7 % (ref 0.0–13.0)
NEUT#: 5.9 10*3/uL (ref 1.5–6.5)
NEUT%: 77.2 % (ref 39.6–80.0)
PLATELETS: 411 10*3/uL — AB (ref 145–400)
RBC: 3.81 10*6/uL (ref 3.70–5.32)
RDW: 16.6 % — AB (ref 11.1–15.7)
WBC: 7.7 10*3/uL (ref 3.9–10.0)

## 2014-12-31 MED ORDER — SODIUM CHLORIDE 0.9 % IV SOLN
375.0000 mg/m2 | Freq: Once | INTRAVENOUS | Status: AC
  Administered 2014-12-31: 600 mg via INTRAVENOUS
  Filled 2014-12-31: qty 24

## 2014-12-31 MED ORDER — SODIUM CHLORIDE 0.9 % IV SOLN
Freq: Once | INTRAVENOUS | Status: AC
  Administered 2014-12-31: 11:00:00 via INTRAVENOUS
  Filled 2014-12-31: qty 8

## 2014-12-31 MED ORDER — HEPARIN SOD (PORK) LOCK FLUSH 100 UNIT/ML IV SOLN
500.0000 [IU] | Freq: Once | INTRAVENOUS | Status: AC | PRN
  Administered 2014-12-31: 500 [IU]
  Filled 2014-12-31: qty 5

## 2014-12-31 MED ORDER — SODIUM CHLORIDE 0.9 % IJ SOLN
10.0000 mL | INTRAMUSCULAR | Status: DC | PRN
  Administered 2014-12-31: 10 mL
  Filled 2014-12-31: qty 10

## 2014-12-31 MED ORDER — CYANOCOBALAMIN 1000 MCG/ML IJ SOLN
1000.0000 ug | Freq: Once | INTRAMUSCULAR | Status: AC
  Administered 2014-12-31: 1000 ug via INTRAMUSCULAR

## 2014-12-31 MED ORDER — SODIUM CHLORIDE 0.9 % IV SOLN
Freq: Once | INTRAVENOUS | Status: AC
  Administered 2014-12-31: 11:00:00 via INTRAVENOUS

## 2014-12-31 MED ORDER — SODIUM CHLORIDE 0.9 % IV SOLN
350.0000 mg | Freq: Once | INTRAVENOUS | Status: AC
  Administered 2014-12-31: 350 mg via INTRAVENOUS
  Filled 2014-12-31: qty 35

## 2014-12-31 MED ORDER — CYANOCOBALAMIN 1000 MCG/ML IJ SOLN
INTRAMUSCULAR | Status: AC
  Filled 2014-12-31: qty 1

## 2014-12-31 NOTE — Patient Instructions (Signed)
[facility] Discharge Instructions for Patients Receiving Chemotherapy  Today you received the following chemotherapy agents  carboplatin and Alimta  To help prevent nausea and vomiting after your treatment, we encourage you to take your nausea medication as prescribed    If you develop nausea and vomiting that is not controlled by your nausea medication, call the clinic. If it is after clinic hours your family physician or the after hours number for the clinic or go to the Emergency Department.   BELOW ARE SYMPTOMS THAT SHOULD BE REPORTED IMMEDIATELY:  *FEVER GREATER THAN 100.5 F  *CHILLS WITH OR WITHOUT FEVER  NAUSEA AND VOMITING THAT IS NOT CONTROLLED WITH YOUR NAUSEA MEDICATION  *UNUSUAL SHORTNESS OF BREATH  *UNUSUAL BRUISING OR BLEEDING  TENDERNESS IN MOUTH AND THROAT WITH OR WITHOUT PRESENCE OF ULCERS  *URINARY PROBLEMS  *BOWEL PROBLEMS  UNUSUAL RASH Items with * indicate a potential emergency and should be followed up as soon as possible.  One of the nurses will contact you 24 hours after your treatment. Please let the nurse know about any problems that you may have experienced. Feel free to call the clinic you have any questions or concerns. The clinic phone number is [phone].   I have been informed and understand all the instructions given to me. I know to contact the clinic, my physician, or go to the Emergency Department if any problems should occur. I do not have any questions at this time, but understand that I may call the clinic during office hours   should I have any questions or need assistance in obtaining follow up care.    __________________________________________  _____________  __________ Signature of Patient or Authorized Representative            Date                   Time    __________________________________________ Nurse's Signature  Pemetrexed injection What is this medicine? PEMETREXED (PEM e TREX ed) is a  chemotherapy drug. This medicine affects cells that are rapidly growing, such as cancer cells and cells in your mouth and stomach. It is usually used to treat lung cancers like non-small cell lung cancer and mesothelioma. It may also be used to treat other cancers. This medicine may be used for other purposes; ask your health care provider or pharmacist if you have questions. What should I tell my health care provider before I take this medicine? They need to know if you have any of these conditions: -if you frequently drink alcohol containing beverages -infection (especially a virus infection such as chickenpox, cold sores, or herpes) -kidney disease -liver disease -low blood counts, like low platelets, red bloods, or white blood cells -an unusual or allergic reaction to pemetrexed, mannitol, other medicines, foods, dyes, or preservatives -pregnant or trying to get pregnant -breast-feeding How should I use this medicine? This drug is given as an infusion into a vein. It is administered in a hospital or clinic by a specially trained health care professional. Talk to your pediatrician regarding the use of this medicine in children. Special care may be needed. Overdosage: If you think you have taken too much of this medicine contact a poison control center or emergency room at once. NOTE: This medicine is only for you. Do not share this medicine with others. What if I miss a dose? It is important not to miss your dose. Call your doctor or health care professional if you are unable to keep  an appointment. What may interact with this medicine? -aspirin and aspirin-like medicines -medicines to increase blood counts like filgrastim, pegfilgrastim, sargramostim -methotrexate -NSAIDS, medicines for pain and inflammation, like ibuprofen or naproxen -probenecid -pyrimethamine -vaccines Talk to your doctor or health care professional before taking any of these  medicines: -acetaminophen -aspirin -ibuprofen -ketoprofen -naproxen This list may not describe all possible interactions. Give your health care provider a list of all the medicines, herbs, non-prescription drugs, or dietary supplements you use. Also tell them if you smoke, drink alcohol, or use illegal drugs. Some items may interact with your medicine. What should I watch for while using this medicine? Visit your doctor for checks on your progress. This drug may make you feel generally unwell. This is not uncommon, as chemotherapy can affect healthy cells as well as cancer cells. Report any side effects. Continue your course of treatment even though you feel ill unless your doctor tells you to stop. In some cases, you may be given additional medicines to help with side effects. Follow all directions for their use. Call your doctor or health care professional for advice if you get a fever, chills or sore throat, or other symptoms of a cold or flu. Do not treat yourself. This drug decreases your body's ability to fight infections. Try to avoid being around people who are sick. This medicine may increase your risk to bruise or bleed. Call your doctor or health care professional if you notice any unusual bleeding. Be careful brushing and flossing your teeth or using a toothpick because you may get an infection or bleed more easily. If you have any dental work done, tell your dentist you are receiving this medicine. Avoid taking products that contain aspirin, acetaminophen, ibuprofen, naproxen, or ketoprofen unless instructed by your doctor. These medicines may hide a fever. Call your doctor or health care professional if you get diarrhea or mouth sores. Do not treat yourself. To protect your kidneys, drink water or other fluids as directed while you are taking this medicine. Men and women must use effective birth control while taking this medicine. You may also need to continue using effective birth  control for a time after stopping this medicine. Do not become pregnant while taking this medicine. Tell your doctor right away if you think that you or your partner might be pregnant. There is a potential for serious side effects to an unborn child. Talk to your health care professional or pharmacist for more information. Do not breast-feed an infant while taking this medicine. This medicine may lower sperm counts. What side effects may I notice from receiving this medicine? Side effects that you should report to your doctor or health care professional as soon as possible: -allergic reactions like skin rash, itching or hives, swelling of the face, lips, or tongue -low blood counts - this medicine may decrease the number of white blood cells, red blood cells and platelets. You may be at increased risk for infections and bleeding. -signs of infection - fever or chills, cough, sore throat, pain or difficulty passing urine -signs of decreased platelets or bleeding - bruising, pinpoint red spots on the skin, black, tarry stools, blood in the urine -signs of decreased red blood cells - unusually weak or tired, fainting spells, lightheadedness -breathing problems, like a dry cough -changes in emotions or moods -chest pain -confusion -diarrhea -high blood pressure -mouth or throat sores or ulcers -pain, swelling, warmth in the leg -pain on swallowing -swelling of the ankles, feet, hands -trouble passing  urine or change in the amount of urine -vomiting -yellowing of the eyes or skin Side effects that usually do not require medical attention (report to your doctor or health care professional if they continue or are bothersome): -hair loss -loss of appetite -nausea -stomach upset This list may not describe all possible side effects. Call your doctor for medical advice about side effects. You may report side effects to FDA at 1-800-FDA-1088. Where should I keep my medicine? This drug is given in a  hospital or clinic and will not be stored at home. NOTE: This sheet is a summary. It may not cover all possible information. If you have questions about this medicine, talk to your doctor, pharmacist, or health care provider.    2016, [employer]/Gold Standard. (2007-08-28 13:24:03)     Carboplatin injection What is this medicine? CARBOPLATIN (KAR boe pla tin) is a chemotherapy drug. It targets fast dividing cells, like cancer cells, and causes these cells to die. This medicine is used to treat ovarian cancer and many other cancers. This medicine may be used for other purposes; ask your health care provider or pharmacist if you have questions. What should I tell my health care provider before I take this medicine? They need to know if you have any of these conditions: -blood disorders -hearing problems -kidney disease -recent or ongoing radiation therapy -an unusual or allergic reaction to carboplatin, cisplatin, other chemotherapy, other medicines, foods, dyes, or preservatives -pregnant or trying to get pregnant -breast-feeding How should I use this medicine? This drug is usually given as an infusion into a vein. It is administered in a hospital or clinic by a specially trained health care professional. Talk to your pediatrician regarding the use of this medicine in children. Special care may be needed. Overdosage: If you think you have taken too much of this medicine contact a poison control center or emergency room at once. NOTE: This medicine is only for you. Do not share this medicine with others. What if I miss a dose? It is important not to miss a dose. Call your doctor or health care professional if you are unable to keep an appointment. What may interact with this medicine? -medicines for seizures -medicines to increase blood counts like filgrastim, pegfilgrastim, sargramostim -some antibiotics like amikacin, gentamicin, neomycin, streptomycin, tobramycin -vaccines Talk to  your doctor or health care professional before taking any of these medicines: -acetaminophen -aspirin -ibuprofen -ketoprofen -naproxen This list may not describe all possible interactions. Give your health care provider a list of all the medicines, herbs, non-prescription drugs, or dietary supplements you use. Also tell them if you smoke, drink alcohol, or use illegal drugs. Some items may interact with your medicine. What should I watch for while using this medicine? Your condition will be monitored carefully while you are receiving this medicine. You will need important blood work done while you are taking this medicine. This drug may make you feel generally unwell. This is not uncommon, as chemotherapy can affect healthy cells as well as cancer cells. Report any side effects. Continue your course of treatment even though you feel ill unless your doctor tells you to stop. In some cases, you may be given additional medicines to help with side effects. Follow all directions for their use. Call your doctor or health care professional for advice if you get a fever, chills or sore throat, or other symptoms of a cold or flu. Do not treat yourself. This drug decreases your body's ability to fight infections. Try  to avoid being around people who are sick. This medicine may increase your risk to bruise or bleed. Call your doctor or health care professional if you notice any unusual bleeding. Be careful brushing and flossing your teeth or using a toothpick because you may get an infection or bleed more easily. If you have any dental work done, tell your dentist you are receiving this medicine. Avoid taking products that contain aspirin, acetaminophen, ibuprofen, naproxen, or ketoprofen unless instructed by your doctor. These medicines may hide a fever. Do not become pregnant while taking this medicine. Women should inform their doctor if they wish to become pregnant or think they might be pregnant. There is a  potential for serious side effects to an unborn child. Talk to your health care professional or pharmacist for more information. Do not breast-feed an infant while taking this medicine. What side effects may I notice from receiving this medicine? Side effects that you should report to your doctor or health care professional as soon as possible: -allergic reactions like skin rash, itching or hives, swelling of the face, lips, or tongue -signs of infection - fever or chills, cough, sore throat, pain or difficulty passing urine -signs of decreased platelets or bleeding - bruising, pinpoint red spots on the skin, black, tarry stools, nosebleeds -signs of decreased red blood cells - unusually weak or tired, fainting spells, lightheadedness -breathing problems -changes in hearing -changes in vision -chest pain -high blood pressure -low blood counts - This drug may decrease the number of white blood cells, red blood cells and platelets. You may be at increased risk for infections and bleeding. -nausea and vomiting -pain, swelling, redness or irritation at the injection site -pain, tingling, numbness in the hands or feet -problems with balance, talking, walking -trouble passing urine or change in the amount of urine Side effects that usually do not require medical attention (report to your doctor or health care professional if they continue or are bothersome): -hair loss -loss of appetite -metallic taste in the mouth or changes in taste This list may not describe all possible side effects. Call your doctor for medical advice about side effects. You may report side effects to FDA at 1-800-FDA-1088. Where should I keep my medicine? This drug is given in a hospital or clinic and will not be stored at home. NOTE: This sheet is a summary. It may not cover all possible information. If you have questions about this medicine, talk to your doctor, pharmacist, or health care provider.    2016,  [employer]/Gold Standard. (2007-05-01 14:38:05)

## 2014-12-31 NOTE — Progress Notes (Signed)
Hematology and Oncology Follow Up Visit  [name] [id number] 05-25-1950 64 y.o. 12/31/2014   Principle Diagnosis:   Metastatic adenocarcinoma the lung  Current Therapy:    Patient s/p cycle #2 of Carboplatin/Alimta/Avastin  Xgeva 120 mg subcutaneous every month     Interim History:  [name] is back for follow-up. Finally, she is beginning to feel better.  We did go ahead and get another PET scan on her. The PET scan showed that she was responding. She had a decreased activity within her neck, mediastinal and hilar nodes. Also noted was decreased uptake in her lung nodules. She had decreased uptake within her bones. There is decrease in the left pleural effusion.  She does not feel as short of breath.  Her appetite might be a little bit better.  She's had no bleeding. She's had no diarrhea. She's had no mouth sores. She's had no leg swelling. She's had no rashes.  Overall, her performance status is ECOG 1.  Medications:  Current outpatient prescriptions:  .  amoxicillin (AMOXIL) 500 MG tablet, Take 500 mg by mouth 3 (three) times daily., Disp: , Rfl:  .  aspirin 81 MG tablet, Take 81 mg by mouth daily.  , Disp: , Rfl:  .  calcium-vitamin D (OSCAL) 250-125 MG-UNIT per tablet, Take 1 tablet by mouth daily.  , Disp: , Rfl:  .  Cholecalciferol (VITAMIN D) 2000 UNITS tablet, Take 2,000 Units by mouth daily., Disp: , Rfl:  .  citalopram (CELEXA) 20 MG tablet, Take 1 tablet (20 mg total) by mouth daily., Disp: 30 tablet, Rfl: 6 .  dexamethasone (DECADRON) 4 MG tablet, Take 1 tab two times a day the day before Alimta chemo. Take 2 tabs two times a day starting the day after chemo for 3 days., Disp: 30 tablet, Rfl: 1 .  fish oil-omega-3 fatty acids 1000 MG capsule, Take 2 g by mouth daily.  , Disp: , Rfl:  .  folic acid (FOLVITE) 1 MG tablet, Take 1 tablet (1 mg total) by mouth daily. Start 5-7 days before Alimta chemotherapy. Continue until 21 days after Alimta completed.,  Disp: 100 tablet, Rfl: 3 .  gabapentin (NEURONTIN) 300 MG capsule, Take 1 capsule (300 mg total) by mouth 3 (three) times daily., Disp: 90 capsule, Rfl: 2 .  KLOR-CON M20 20 MEQ tablet, TAKE 1 TABLET BY MOUTH DAILY, Disp: 30 tablet, Rfl: 3 .  lactulose (CHRONULAC) 10 GM/15ML solution, Take 2 tablespoons 3x a day., Disp: 1000 mL, Rfl: 6 .  lidocaine-prilocaine (EMLA) cream, Apply to affected area once, Disp: 30 g, Rfl: 3 .  LORazepam (ATIVAN) 0.5 MG tablet, Take 1 tablet (0.5 mg total) by mouth every 6 (six) hours as needed (Nausea or vomiting)., Disp: 30 tablet, Rfl: 0 .  megestrol (MEGACE) 400 MG/10ML suspension, Take 20 mLs (800 mg total) by mouth daily., Disp: 240 mL, Rfl: 0 .  mometasone-formoterol (DULERA) 100-5 MCG/ACT AERO, Inhale 1 puff into the lungs 2 (two) times daily., Disp: , Rfl:  .  neomycin-polymyxin-hydrocortisone (CORTISPORIN) otic solution, Place 3 drops into the left ear 4 (four) times daily., Disp: 10 mL, Rfl: 0 .  omeprazole (PRILOSEC) 40 MG capsule, Take 40 mg by mouth 2 (two) times daily. , Disp: , Rfl: 3 .  ondansetron (ZOFRAN) 8 MG tablet, Take 1 tablet (8 mg total) by mouth 2 (two) times daily. Start the day after chemo for 3 days. Then take as needed for nausea or vomiting., Disp: 30 tablet, Rfl: 1 .  oxyCODONE (OXY IR/ROXICODONE) 5 MG immediate release tablet, Take 1-2 pills, if needed, every 4 hours for pain secondary to cancer., Disp: 90 tablet, Rfl: 0 .  PROAIR RESPICLICK 884 (90 BASE) MCG/ACT AEPB, USE 2 PUFFS EVERY 4-6 HOURS, Disp: , Rfl: 0 .  prochlorperazine (COMPAZINE) 10 MG tablet, Take 1 tablet (10 mg total) by mouth every 6 (six) hours as needed (Nausea or vomiting)., Disp: 30 tablet, Rfl: 1 .  PROVENTIL HFA 108 (90 BASE) MCG/ACT inhaler, as needed. , Disp: , Rfl:  .  SUMAtriptan (IMITREX) 100 MG tablet, Take 100 mg by mouth as needed. For H/A, Disp: , Rfl:  No current facility-administered medications for this visit.  Facility-Administered Medications  Ordered in Other Visits:  .  fludeoxyglucose F - 18 (FDG) injection 6.83 milli Curie, 6.83 milli Curie, Intravenous, Once PRN, Volanda Napoleon, MD, 6.83 milli Curie at 12/30/14 [phone] .  sodium chloride 0.9 % injection 10 mL, 10 mL, Intravenous, PRN, Golden Pop, FNP, 10 mL at 12/06/13 1149 .  sodium chloride 0.9 % injection 10 mL, 10 mL, Intracatheter, PRN, Volanda Napoleon, MD, 10 mL at 12/31/14 1235  Allergies:  Allergies  Allergen Reactions  . Demerol Other (See Comments)    Syncope  . Mefoxin [Cefoxitin Sodium In Dextrose]     rash  . Topamax Other (See Comments)    Had trouble walking  . Avelox [Moxifloxacin Hcl In Nacl]     Hypotension.    Past Medical History, Surgical history, Social history, and Family History were reviewed and updated.  Review of Systems: As above  Physical Exam:  height is '5\' 1"'$  (1.549 m) and weight is 126 lb (57.153 kg). Her oral temperature is 97.6 F (36.4 C). Her blood pressure is 138/90 and her pulse is 95. Her respiration is 14.   Wt Readings from Last 3 Encounters:  12/31/14 126 lb (57.153 kg)  12/10/14 128 lb (58.06 kg)  11/13/14 132 lb (59.875 kg)     Well-developed and well-nourished white female in no obvious distress. Head and neck exam shows no ocular or oral lesions. She has no palpable cervical or supraclavicular lymph nodes. Lungs are clear to percussion and auscultation on the right side. She has decreased breath sounds on the left side at the base of the left lung.. . Cardiac exam regular rate and rhythm with no murmurs, rubs or bruits. Abdomen is soft. She has good bowel sounds. There is no fluid wave. There is no palpable liver or spleen tip. Back exam shows no tenderness over the spine, ribs or hips. Extremities shows no clubbing, cyanosis or edema. Neurological exam shows no focal neurological deficits.  Lab Results  Component Value Date   WBC 7.7 12/31/2014   HGB 10.7* 12/31/2014   HCT 33.1* 12/31/2014   MCV 87 12/31/2014     PLT 411* 12/31/2014     Chemistry      Component Value Date/Time   NA 141 12/31/2014 0912   NA 137 06/16/2014 0934   K 3.7 12/31/2014 0912   K 3.9 06/16/2014 0934   CL 102 12/31/2014 0912   CL 104 06/16/2014 0934   CO2 28 12/31/2014 0912   CO2 24 06/16/2014 0934   BUN 14 12/31/2014 0912   BUN 15 06/16/2014 0934   CREATININE 0.7 12/31/2014 0912   CREATININE 0.66 06/16/2014 0934      Component Value Date/Time   CALCIUM 9.8 12/31/2014 0912   CALCIUM 9.1 06/16/2014 0934   ALKPHOS 77 12/31/2014  0912   ALKPHOS 55 06/16/2014 0934   AST 17 12/31/2014 0912   AST 15 06/16/2014 0934   ALT 14 12/31/2014 0912   ALT 16 06/16/2014 0934   BILITOT 0.40 12/31/2014 0912   BILITOT 0.6 06/16/2014 0934         Impression and Plan: Ms. Selinger is a 64 year old white female. I am glad that she is feeling better.  Hopefully, we will see an improvement as we continue to treat her.  We will go ahead and treat her today area and I want to try to keep her on therapy on schedule as she is responding and I want her to have try to continue with a response so that her quality of life improves.  We will plan for 2 additional cycles of treatment and then repeat a scan.  She will get her Delton See today.  I does want her to have a nice Thanksgiving.  I spent about 35 minutes with she and her husband today.   Volanda Napoleon, MD 11/23/20163:28 PM

## 2015-01-02 ENCOUNTER — Ambulatory Visit: Payer: PPO

## 2015-01-02 ENCOUNTER — Other Ambulatory Visit: Payer: PPO

## 2015-01-05 ENCOUNTER — Ambulatory Visit: Payer: PPO

## 2015-01-05 ENCOUNTER — Other Ambulatory Visit: Payer: PPO

## 2015-01-11 ENCOUNTER — Emergency Department (HOSPITAL_BASED_OUTPATIENT_CLINIC_OR_DEPARTMENT_OTHER): Payer: PPO

## 2015-01-11 ENCOUNTER — Emergency Department (HOSPITAL_BASED_OUTPATIENT_CLINIC_OR_DEPARTMENT_OTHER)
Admission: EM | Admit: 2015-01-11 | Discharge: 2015-01-11 | Disposition: A | Discharge status: Home / Self Care | Payer: PPO | Attending: Emergency Medicine | Admitting: Emergency Medicine

## 2015-01-11 ENCOUNTER — Encounter (HOSPITAL_BASED_OUTPATIENT_CLINIC_OR_DEPARTMENT_OTHER): Payer: Self-pay | Admitting: *Deleted

## 2015-01-11 DIAGNOSIS — R079 Chest pain, unspecified: Secondary | ICD-10-CM | POA: Diagnosis present

## 2015-01-11 DIAGNOSIS — K219 Gastro-esophageal reflux disease without esophagitis: Secondary | ICD-10-CM | POA: Insufficient documentation

## 2015-01-11 DIAGNOSIS — I10 Essential (primary) hypertension: Secondary | ICD-10-CM | POA: Diagnosis not present

## 2015-01-11 DIAGNOSIS — K449 Diaphragmatic hernia without obstruction or gangrene: Secondary | ICD-10-CM | POA: Diagnosis not present

## 2015-01-11 DIAGNOSIS — I313 Pericardial effusion (noninflammatory): Secondary | ICD-10-CM | POA: Diagnosis not present

## 2015-01-11 DIAGNOSIS — R091 Pleurisy: Secondary | ICD-10-CM

## 2015-01-11 DIAGNOSIS — Z79899 Other long term (current) drug therapy: Secondary | ICD-10-CM | POA: Insufficient documentation

## 2015-01-11 DIAGNOSIS — Z7982 Long term (current) use of aspirin: Secondary | ICD-10-CM | POA: Diagnosis not present

## 2015-01-11 DIAGNOSIS — C3432 Malignant neoplasm of lower lobe, left bronchus or lung: Secondary | ICD-10-CM

## 2015-01-11 DIAGNOSIS — I3139 Other pericardial effusion (noninflammatory): Secondary | ICD-10-CM

## 2015-01-11 DIAGNOSIS — Z87891 Personal history of nicotine dependence: Secondary | ICD-10-CM | POA: Insufficient documentation

## 2015-01-11 DIAGNOSIS — E559 Vitamin D deficiency, unspecified: Secondary | ICD-10-CM | POA: Insufficient documentation

## 2015-01-11 DIAGNOSIS — Z8673 Personal history of transient ischemic attack (TIA), and cerebral infarction without residual deficits: Secondary | ICD-10-CM | POA: Diagnosis not present

## 2015-01-11 LAB — BASIC METABOLIC PANEL
ANION GAP: 9 (ref 5–15)
BUN: 11 mg/dL (ref 6–20)
CALCIUM: 8.8 mg/dL — AB (ref 8.9–10.3)
CO2: 25 mmol/L (ref 22–32)
CREATININE: 0.53 mg/dL (ref 0.44–1.00)
Chloride: 100 mmol/L — ABNORMAL LOW (ref 101–111)
Glucose, Bld: 136 mg/dL — ABNORMAL HIGH (ref 65–99)
Potassium: 3.7 mmol/L (ref 3.5–5.1)
SODIUM: 134 mmol/L — AB (ref 135–145)

## 2015-01-11 LAB — CBC
HCT: 30.8 % — ABNORMAL LOW (ref 36.0–46.0)
HEMOGLOBIN: 9.9 g/dL — AB (ref 12.0–15.0)
MCH: 27.7 pg (ref 26.0–34.0)
MCHC: 32.1 g/dL (ref 30.0–36.0)
MCV: 86 fL (ref 78.0–100.0)
PLATELETS: 229 10*3/uL (ref 150–400)
RBC: 3.58 MIL/uL — AB (ref 3.87–5.11)
RDW: 17 % — ABNORMAL HIGH (ref 11.5–15.5)
WBC: 10.3 10*3/uL (ref 4.0–10.5)

## 2015-01-11 LAB — HEPATIC FUNCTION PANEL
ALBUMIN: 3.6 g/dL (ref 3.5–5.0)
ALT: 10 U/L — ABNORMAL LOW (ref 14–54)
AST: 18 U/L (ref 15–41)
Alkaline Phosphatase: 75 U/L (ref 38–126)
BILIRUBIN TOTAL: 0.9 mg/dL (ref 0.3–1.2)
Bilirubin, Direct: 0.2 mg/dL (ref 0.1–0.5)
Indirect Bilirubin: 0.7 mg/dL (ref 0.3–0.9)
Total Protein: 7.8 g/dL (ref 6.5–8.1)

## 2015-01-11 LAB — LIPASE, BLOOD: LIPASE: 25 U/L (ref 11–51)

## 2015-01-11 LAB — TROPONIN I

## 2015-01-11 MED ORDER — HYDROMORPHONE HCL 2 MG PO TABS
1.0000 mg | ORAL_TABLET | ORAL | Status: DC | PRN
Start: 2015-01-11 — End: 2015-04-04

## 2015-01-11 MED ORDER — IOHEXOL 350 MG/ML SOLN
100.0000 mL | Freq: Once | INTRAVENOUS | Status: AC | PRN
  Administered 2015-01-11: 100 mL via INTRAVENOUS

## 2015-01-11 MED ORDER — ONDANSETRON HCL 4 MG/2ML IJ SOLN
4.0000 mg | Freq: Once | INTRAMUSCULAR | Status: AC
  Administered 2015-01-11: 4 mg via INTRAVENOUS
  Filled 2015-01-11: qty 2

## 2015-01-11 MED ORDER — HYDROMORPHONE HCL 1 MG/ML IJ SOLN
1.0000 mg | Freq: Once | INTRAMUSCULAR | Status: AC
  Administered 2015-01-11: 1 mg via INTRAVENOUS
  Filled 2015-01-11: qty 1

## 2015-01-11 NOTE — ED Provider Notes (Addendum)
CSN: [id number]     Arrival date & time 01/11/15  7673 History   First MD Initiated Contact with Patient 01/11/15 [id number]     Chief Complaint  Patient presents with  . Chest Pain     (Consider location/radiation/quality/duration/timing/severity/associated sxs/prior Treatment) HPI Patient has known lung cancer. She has chronic and recurrent pleural effusion on the left. She reports this has to get drained periodically. She reports this morning however she woke with a very sharp pain in her right mid and lower chest. This has not been a typical area pain for her. She describes it as sharp and localized in nature. She reports it feels like it's hard to take a deep breath because of it. She denies she's had a pulmonary embolus. She denies lower extremity swelling or pain. She has not had recent fever or cough. No syncopal episode associated. She tried one of her oxycodone code own 5 mg tablets at approximately 2 AM without any significant relief. Past Medical History  Diagnosis Date  . Cancer [facility]) 2005 & 2008    Right Upper Lobe (2005) & Left Upper Lobe (2008). Subsequent Nodules treated empirically in 2010 & 2011 w/ XRT & chemo.  Marland Kitchen Hypertension   . Stroke ([place])   . GERD (gastroesophageal reflux disease)   . B12 deficiency   . Vitamin D deficiency    Past Surgical History  Procedure Laterality Date  . Vaginal hysterectomy  1983  . Cyst removed from ovary  1968  . Lung removal, partial  2005, 2008    R upper lobe and left upper lobe wedge  . Bronchoscopy    . Mediastinoscopy    . Cholecystectomy    . Appendectomy    . Hiatal hernia repair  2010   Family History  Problem Relation Age of Onset  . Heart attack Father   . Hypertension Mother   . Diabetes Mother   . Congestive Heart Failure Mother   . Breast cancer Sister   . Lung disease Neg Hx    Social History  Substance Use Topics  . Smoking status: Former Smoker -- 2.00 packs/day for 33 years    Types: Cigarettes    Start  date: 07/19/1967    Quit date: 01/23/2003  . Smokeless tobacco: Never Used  . Alcohol Use: No   OB History    No data available     Review of Systems  10 Systems reviewed and are negative for acute change except as noted in the HPI.  Allergies  Demerol; Mefoxin; Topamax; and Avelox  Home Medications   Prior to Admission medications   Medication Sig Start Date End Date Taking? Authorizing Provider  amoxicillin (AMOXIL) 500 MG tablet Take 500 mg by mouth 3 (three) times daily.    Historical Provider, MD  aspirin 81 MG tablet Take 81 mg by mouth daily.      Historical Provider, MD  calcium-vitamin D (OSCAL) 250-125 MG-UNIT per tablet Take 1 tablet by mouth daily.      Historical Provider, MD  Cholecalciferol (VITAMIN D) 2000 UNITS tablet Take 2,000 Units by mouth daily.    Historical Provider, MD  citalopram (CELEXA) 20 MG tablet Take 1 tablet (20 mg total) by mouth daily. 11/19/14   Volanda Napoleon, MD  dexamethasone (DECADRON) 4 MG tablet Take 1 tab two times a day the day before Alimta chemo. Take 2 tabs two times a day starting the day after chemo for 3 days. 11/14/14   Volanda Napoleon, MD  fish oil-omega-3 fatty acids 1000 MG capsule Take 2 g by mouth daily.      Historical Provider, MD  folic acid (FOLVITE) 1 MG tablet Take 1 tablet (1 mg total) by mouth daily. Start 5-7 days before Alimta chemotherapy. Continue until 21 days after Alimta completed. 11/14/14   Volanda Napoleon, MD  gabapentin (NEURONTIN) 300 MG capsule Take 1 capsule (300 mg total) by mouth 3 (three) times daily. 11/20/14   Volanda Napoleon, MD  HYDROmorphone (DILAUDID) 2 MG tablet Take 0.5 tablets (1 mg total) by mouth every 4 (four) hours as needed for severe pain. 01/11/15   Charlesetta Shanks, MD  KLOR-CON M20 20 MEQ tablet TAKE 1 TABLET BY MOUTH DAILY 12/10/14   Volanda Napoleon, MD  lactulose [facility]) 10 GM/15ML solution Take 2 tablespoons 3x a day. 12/10/14   Volanda Napoleon, MD  lidocaine-prilocaine (EMLA) cream  Apply to affected area once 11/14/14   Volanda Napoleon, MD  LORazepam (ATIVAN) 0.5 MG tablet Take 1 tablet (0.5 mg total) by mouth every 6 (six) hours as needed (Nausea or vomiting). 11/14/14   Volanda Napoleon, MD  megestrol (MEGACE) 400 MG/10ML suspension Take 20 mLs (800 mg total) by mouth daily. 12/12/14   Eliezer Bottom, NP  mometasone-formoterol (DULERA) 100-5 MCG/ACT AERO Inhale 1 puff into the lungs 2 (two) times daily.    Historical Provider, MD  neomycin-polymyxin-hydrocortisone (CORTISPORIN) otic solution Place 3 drops into the left ear 4 (four) times daily. 12/10/14   Volanda Napoleon, MD  omeprazole (PRILOSEC) 40 MG capsule Take 40 mg by mouth 2 (two) times daily.  12/05/13   Historical Provider, MD  ondansetron (ZOFRAN) 8 MG tablet Take 1 tablet (8 mg total) by mouth 2 (two) times daily. Start the day after chemo for 3 days. Then take as needed for nausea or vomiting. 11/14/14   Volanda Napoleon, MD  oxyCODONE (OXY IR/ROXICODONE) 5 MG immediate release tablet Take 1-2 pills, if needed, every 4 hours for pain secondary to cancer. 11/13/14   Volanda Napoleon, MD  PROAIR RESPICLICK 720 (90 BASE) MCG/ACT AEPB USE 2 PUFFS EVERY 4-6 HOURS 10/11/14   Historical Provider, MD  prochlorperazine (COMPAZINE) 10 MG tablet Take 1 tablet (10 mg total) by mouth every 6 (six) hours as needed (Nausea or vomiting). 11/14/14   Volanda Napoleon, MD  PROVENTIL HFA 108 (90 BASE) MCG/ACT inhaler as needed.  06/01/11   Historical Provider, MD  SUMAtriptan (IMITREX) 100 MG tablet Take 100 mg by mouth as needed. For H/A    Historical Provider, MD   BP 98/71 mmHg  Pulse 100  Resp 18  SpO2 99% Physical Exam  Constitutional: She is oriented to person, place, and time. She appears well-developed and well-nourished.  Patient is slender but in good physical condition. She does appear to be in severe pain. She is somewhat tachypneic but no signs acute respiratory failure.  HENT:  Head: Normocephalic and atraumatic.   Mouth/Throat: Oropharynx is clear and moist.  Eyes: EOM are normal. Pupils are equal, round, and reactive to light.  Neck: Neck supple.  Cardiovascular: Regular rhythm, normal heart sounds and intact distal pulses.   Tachycardic. No rub murmur gallop.  Pulmonary/Chest:  Mild tachypnea. Patient has air flow throughout the right lung field without any evident wheeze or rhonchi. No rale. There are transmitted breath sounds in the left lung base without obvious rhonchi. No significantly reproducible chest wall pain.  Abdominal: Soft. Bowel sounds are normal. She  exhibits no distension. There is no tenderness.  Musculoskeletal: Normal range of motion. She exhibits no edema or tenderness.  Neurological: She is alert and oriented to person, place, and time. She has normal strength. Coordination normal. GCS eye subscore is 4. GCS verbal subscore is 5. GCS motor subscore is 6.  Skin: Skin is warm, dry and intact.  Psychiatric: She has a normal mood and affect.    ED Course  Procedures (including critical care time) CRITICAL CARE Performed by: Charlesetta Shanks   Total critical care time: 30 minutes  Critical care time was exclusive of separately billable procedures and treating other patients.  Critical care was necessary to treat or prevent imminent or life-threatening deterioration.  Critical care was time spent personally by me on the following activities: development of treatment plan with patient and/or surrogate as well as nursing, discussions with consultants, evaluation of patient's response to treatment, examination of patient, obtaining history from patient or surrogate, ordering and performing treatments and interventions, ordering and review of laboratory studies, ordering and review of radiographic studies, pulse oximetry and re-evaluation of patient's condition. Labs Review Labs Reviewed  BASIC METABOLIC PANEL - Abnormal; Notable for the following:    Sodium 134 (*)    Chloride  100 (*)    Glucose, Bld 136 (*)    Calcium 8.8 (*)    All other components within normal limits  CBC - Abnormal; Notable for the following:    RBC 3.58 (*)    Hemoglobin 9.9 (*)    HCT 30.8 (*)    RDW 17.0 (*)    All other components within normal limits  HEPATIC FUNCTION PANEL - Abnormal; Notable for the following:    ALT 10 (*)    All other components within normal limits  TROPONIN I  LIPASE, BLOOD    Imaging Review Dg Chest 2 View  01/11/2015  CLINICAL DATA:  Chest pain and shortness of breath. History of metastatic lung carcinoma. EXAM: CHEST - 2 VIEW COMPARISON:  12/10/2014 and chest [facility] at [facility] on 12/02/2014 prior to left thoracentesis. FINDINGS: Compared to the prior study, volume of chronic left pleural fluid appears similar. There is increased opacity in the adjacent left lower lung which may be reflective of atelectasis or potentially an acute infiltrate. No pneumothorax identified. Appearance of the right lung is stable including residual mass-like opacity in the perihilar region. The heart size is stable. Appearance of a porta cath is also stable. IMPRESSION: Stable volume of chronic left pleural fluid. Increased opacity in the adjacent left lower lung may reflect atelectasis or potentially infiltrate. Electronically Signed   By: Aletta Edouard M.D.   On: 01/11/2015 10:25   Ct Angio Chest Pe W/cm &/or Wo Cm  01/11/2015  CLINICAL DATA:  Chest pain, dyspnea, nausea and vomiting. History of metastatic lung carcinoma EXAM: CT ANGIOGRAPHY CHEST WITH CONTRAST TECHNIQUE: Multidetector CT imaging of the chest was performed using the standard protocol during bolus administration of intravenous contrast. Multiplanar CT image reconstructions and MIPs were obtained to evaluate the vascular anatomy. CONTRAST:  17m OMNIPAQUE IOHEXOL 350 MG/ML SOLN COMPARISON:  Chest x-ray today, PET scan on 12/30/2014, CT of the chest on 10/30/2014 and multiple additional prior imaging  procedures. FINDINGS: Compared to the recent PET scan, volume of a partially loculated left basilar pleural effusion appears stable and is small to moderate in size associated atelectasis and airspace consolidation of the left lower lung is slightly worse. Component of pneumonia cannot be excluded. There is  no evidence of pulmonary embolism. There is a new small pericardial effusion. The right lung demonstrates stable postoperative changes as well as areas of nodularity and crowding of vasculature due to prior lobectomy. No gross change in small mediastinal and bilateral hilar lymph nodes. The thoracic aorta is normally patent and shows normal caliber without evidence of dissection or aneurysmal disease. Stable small to moderate-sized hiatal hernia. Stable small sclerotic metastatic lesions of multiple thoracic vertebral bodies and bilateral ribs. Review of the MIP images confirms the above findings. IMPRESSION: 1. No evidence of pulmonary embolism. 2. Stable small to moderate-sized partially loculated left basilar pleural effusion. Increased atelectasis and airspace disease of the left lower lung present. There may be a component of pneumonia. 3. New small pericardial effusion. Electronically Signed   By: Aletta Edouard M.D.   On: 01/11/2015 11:12   I have personally reviewed and evaluated these images and lab results as part of my medical decision-making.   EKG Interpretation   Date/Time:  Sunday January 11 2015 09:54:22 EST Ventricular Rate:  115 PR Interval:  128 QRS Duration: 86 QT Interval:  344 QTC Calculation: 475 R Axis:   53 Text Interpretation:  Sinus tachycardia Otherwise normal ECG Confirmed by  Johnney Killian, MD, Jeannie Done [phone]) on 01/11/2015 10:24:00 AM      MDM   Final diagnoses:  Pleurisy  Malignant neoplasm of lower lobe of left lung (HCC)  Pericardial effusion  Hiatal hernia   Patient has known severe comorbid illness of lung cancer. At this time her primary pain is on the  opposite side of her typical area of pain. I had initial concern for pulmonary embolus with patient's risk factors. Presentation was very suggestive with focal, severe pleuritic type pain. PE study does not show any pulmonary embolus. She has not had fever chills or cough to suggest an acute pneumonia and her area of pain is not on the left. CT identifies a small pericardial effusion. I do not feel that her symptoms were suggestive of this as the etiology. Her pain is very localized and feeling of dyspnea was resolved once the pain was controlled with a milligram of Dilaudid. Also remarked upon was a small to moderate-sized hiatal hernia. Pain could also be related to hiatal hernia. Once the patient's pain controlled, her tachycardia significantly improved to under 100 and subjective dyspnea was improved. At this time I did feel the patient can be discharged with improved pain control as her proximal the code own is not effective. She'll be given Dilaudid 1 mg to take orally. Patient is counseled on contacting her oncologist tomorrow morning to discuss a recheck and scheduling cardiac echo. She is also counseled on the necessity to return should her dyspnea return her pain worsen.    Charlesetta Shanks, MD 01/11/15 1308  Charlesetta Shanks, MD 01/11/15 [id number]

## 2015-01-11 NOTE — ED Notes (Signed)
Returned from radiology. 

## 2015-01-11 NOTE — ED Notes (Signed)
Tolerating ice chips well, no further c/o nausea, vomiting, no further c/o chest pain, only c/o slight neck stiffness

## 2015-01-11 NOTE — ED Notes (Signed)
Placed on cont cardiac monitor, cont POX, 12 lead ECG done and to EDP for evaluation

## 2015-01-11 NOTE — Discharge Instructions (Signed)
Pericardial Effusion Although this does not appear to be the problem that is immediately causing your pain, you need a follow-up with your oncologist within 1-2 days to schedule an echocardiogram. If you have increasing shortness of breath, lightheadedness or pain, return to the emergency department. Pericardial effusion is a buildup of fluid around your heart. The heart is surrounded by a double-layered sac (pericardium). This sac normally contains a small amount of fluid. When too much builds up, it can put pressure on your heart and cause problems. As fluid builds up in the pericardial sac and pressure on your heart increases, it becomes harder for your heart to pump blood. When fluid prevents your heart from pumping enough blood, it is called cardiac tamponade. Cardiac tamponade is a life-threatening condition. CAUSES  Often the cause of pericardial effusion is not known (idiopathic effusion). Possible causes are from:  Infections, such as from a virus, bacteria, fungus, or parasite.  Damage to the pericardium from heart surgery or a heart attack.  Inflammatory diseases, such as rheumatoid arthritis or lupus.  Kidney disease.  Thyroid disease.  Cancer.  Cancer treatment, including radiation or chemotherapy.  Certain drugs, including tuberculosis drugs or seizure drugs.  Chest trauma. SIGNS AND SYMPTOMS  Pericardial effusion may not cause symptoms at first, especially if the fluid builds up slowly. In time, pressure on the heart may cause:  Chest pain.  Trouble breathing.  Pain and shortness of breath that is worse when lying down.  Dizziness.  Fainting.  Cough.  Hiccups.  Skipped heartbeats (palpitations).  Anxiety and confusion.  A bluish skin color (cyanosis).  Swollen legs and ankles. DIAGNOSIS  Your health care provider may suspect pericardial effusion based on your symptoms. Your health care provider may also do a physical exam to check for:  Low blood  pressure and weak pulses.  Soft (muffled) heart sounds.  Rapid heartbeat.  Full veins in your neck (distended jugular veins).  Decreased breathing sounds and a rubbing sound (friction rub) when listening to your lungs. Your health care provider may also do several tests to confirm the diagnosis and find out what is causing the pericardial effusion. These may include:  Chest X-ray.  Imaging study of the heart using sound waves (echocardiogram).  CT scan or MRI.  Electrical study of the heart (electrocardiogram [ECG]).  A procedure using a needle to remove fluid from the pericardium for examination (pericardiocentesis).  Blood tests to check for:  Infection.  Heart damage.  Thyroid abnormalities.  Kidney disease.  Inflammatory disorders. TREATMENT  Treatment for pericardial effusion depends on the cause of your symptoms and how severe your symptoms are. If a specific cause was found, that cause will be treated. Treatment may include:  Medicines, such as:  Nonsteroidal anti-inflammatory drugs (NSAIDs).  Other anti-inflammatory drugs, such as steroids.  Antibiotic medicine.  Antifungal medicine.  Hospital treatment may be necessary, such as for cardiac tamponade. This may include:  Intravenous (IV) fluids.  Breathing support.  Surgery may be needed in severe cases. Surgery may include:  Pericardiocentesis.  Open heart surgery.  A procedure to make a permanent opening in the pericardium (pericardial window). SEEK MEDICAL CARE IF:  You feel dizzy or light-headed.  You have swelling in your legs or ankles.  You have heart palpitations.  You have persistent cough or hiccups. SEEK IMMEDIATE MEDICAL CARE IF:   You faint.  You have chest pain.  You have trouble breathing. These symptoms may represent a serious problem that is an emergency.  Do not wait to see if the symptoms will go away. Get medical help right away. Call your local emergency services  (911 in the [place] Do not drive yourself to the hospital. MAKE SURE YOU:  Understand these instructions.  Will watch your condition.  Will get help right away if you are not doing well or get worse.   This information is not intended to replace advice given to you by your health care provider. Make sure you discuss any questions you have with your health care provider.   Document Released: 09/21/2004 Document Revised: 02/14/2014 Document Reviewed: 06/13/2013 Elsevier Interactive Patient Education [employer].

## 2015-01-11 NOTE — ED Notes (Signed)
[place] 2lpm via [place]

## 2015-01-11 NOTE — ED Notes (Signed)
MD at bedside. 

## 2015-01-11 NOTE — ED Notes (Signed)
Dc instructions reviewed with pt, safety while taking PO pain med discussed, also stressed importance of making follow up appoint with oncologist per EDP recommendations.

## 2015-01-11 NOTE — ED Notes (Signed)
Patient transported to CT 

## 2015-01-11 NOTE — ED Notes (Signed)
Pt medicated with Dilaudid per EDP orders

## 2015-01-11 NOTE — ED Notes (Signed)
Onset of chest pain/ pressure, radiates to back and to left side of chest, states awoke this am suddenly with sharp chest pain

## 2015-01-12 ENCOUNTER — Encounter: Payer: Self-pay | Admitting: Family

## 2015-01-12 ENCOUNTER — Telehealth: Payer: Self-pay | Admitting: *Deleted

## 2015-01-12 ENCOUNTER — Encounter: Payer: Self-pay | Admitting: *Deleted

## 2015-01-12 ENCOUNTER — Ambulatory Visit (HOSPITAL_BASED_OUTPATIENT_CLINIC_OR_DEPARTMENT_OTHER): Payer: PPO | Admitting: Family

## 2015-01-12 VITALS — BP 110/71 | HR 116 | Temp 98.2°F | Resp 20 | Ht 61.0 in | Wt 126.0 lb

## 2015-01-12 DIAGNOSIS — R079 Chest pain, unspecified: Secondary | ICD-10-CM | POA: Diagnosis not present

## 2015-01-12 DIAGNOSIS — R110 Nausea: Secondary | ICD-10-CM

## 2015-01-12 DIAGNOSIS — J948 Other specified pleural conditions: Secondary | ICD-10-CM | POA: Diagnosis not present

## 2015-01-12 DIAGNOSIS — R51 Headache: Secondary | ICD-10-CM | POA: Diagnosis not present

## 2015-01-12 DIAGNOSIS — J90 Pleural effusion, not elsewhere classified: Secondary | ICD-10-CM

## 2015-01-12 DIAGNOSIS — R0602 Shortness of breath: Secondary | ICD-10-CM

## 2015-01-12 DIAGNOSIS — C3490 Malignant neoplasm of unspecified part of unspecified bronchus or lung: Secondary | ICD-10-CM

## 2015-01-12 DIAGNOSIS — C3432 Malignant neoplasm of lower lobe, left bronchus or lung: Secondary | ICD-10-CM

## 2015-01-12 NOTE — Progress Notes (Signed)
Called Radiology at [facility] to schedule thoracentesis  On 01/13/15 at 145p.  Also scheduled Echo for Wednesday 01/14/15

## 2015-01-12 NOTE — Progress Notes (Signed)
Hematology and Oncology Follow Up Visit  [name] [id number] 07/29/1950 64 y.o. 01/12/2015   Principle Diagnosis:  Metastatic adenocarcinoma the lung  Current Therapy:   Patient s/p cycle 3 of Carboplatin/Alimta/Avastin Xgeva 120 mg subcutaneous every month    Interim History:  [name] is here today for a follow-up after being in the ED last night with c/o chest pain. Her CT angio showed no PE but she does have a small to moderate sized left basilar pleural effusion.  She is symptomatic with headache, SOB and nausea. She did not take her pain medication before coming in today because she did not want to feel drowsy while out. She states that the dilaudid is effective controlling her pain.  She denies fever, chills, vomiting, palpitations, abdominal pain or changes in bowel or bladder habits.  Her appetite has decreased over the last few days but she has been able to drink fluids. She is drinking Ensure daily.     Medications:    Medication List       This list is accurate as of: 01/12/15 12:43 PM.  Always use your most recent med list.               amoxicillin 500 MG tablet  Commonly known as:  AMOXIL  Take 500 mg by mouth 3 (three) times daily.     aspirin 81 MG tablet  Take 81 mg by mouth daily.     calcium-vitamin D 250-125 MG-UNIT tablet  Commonly known as:  OSCAL  Take 1 tablet by mouth daily.     citalopram 20 MG tablet  Commonly known as:  CELEXA  Take 1 tablet (20 mg total) by mouth daily.     dexamethasone 4 MG tablet  Commonly known as:  DECADRON  Take 1 tab two times a day the day before Alimta chemo. Take 2 tabs two times a day starting the day after chemo for 3 days.     fish oil-omega-3 fatty acids 1000 MG capsule  Take 2 g by mouth daily.     folic acid 1 MG tablet  Commonly known as:  FOLVITE  Take 1 tablet (1 mg total) by mouth daily. Start 5-7 days before Alimta chemotherapy. Continue until 21 days after Alimta completed.     gabapentin 300 MG capsule  Commonly known as:  NEURONTIN  Take 1 capsule (300 mg total) by mouth 3 (three) times daily.     HYDROmorphone 2 MG tablet  Commonly known as:  DILAUDID  Take 0.5 tablets (1 mg total) by mouth every 4 (four) hours as needed for severe pain.     KLOR-CON M20 20 MEQ tablet  Generic drug:  potassium chloride SA  TAKE 1 TABLET BY MOUTH DAILY     lactulose 10 GM/15ML solution  Commonly known as:  CHRONULAC  Take 2 tablespoons 3x a day.     lidocaine-prilocaine cream  Commonly known as:  EMLA  Apply to affected area once     LORazepam 0.5 MG tablet  Commonly known as:  ATIVAN  Take 1 tablet (0.5 mg total) by mouth every 6 (six) hours as needed (Nausea or vomiting).     megestrol 400 MG/10ML suspension  Commonly known as:  MEGACE  Take 20 mLs (800 mg total) by mouth daily.     mometasone-formoterol 100-5 MCG/ACT Aero  Commonly known as:  DULERA  Inhale 1 puff into the lungs 2 (two) times daily.     neomycin-polymyxin-hydrocortisone otic solution  Commonly known as:  CORTISPORIN  Place 3 drops into the left ear 4 (four) times daily.     omeprazole 40 MG capsule  Commonly known as:  PRILOSEC  Take 40 mg by mouth 2 (two) times daily.     ondansetron 8 MG tablet  Commonly known as:  ZOFRAN  Take 1 tablet (8 mg total) by mouth 2 (two) times daily. Start the day after chemo for 3 days. Then take as needed for nausea or vomiting.     oxyCODONE 5 MG immediate release tablet  Commonly known as:  Oxy IR/ROXICODONE  Take 1-2 pills, if needed, every 4 hours for pain secondary to cancer.     prochlorperazine 10 MG tablet  Commonly known as:  COMPAZINE  Take 1 tablet (10 mg total) by mouth every 6 (six) hours as needed (Nausea or vomiting).     PROVENTIL HFA 108 (90 BASE) MCG/ACT inhaler  Generic drug:  albuterol  as needed.     PROAIR RESPICLICK 409 (90 BASE) MCG/ACT Aepb  Generic drug:  Albuterol Sulfate  USE 2 PUFFS EVERY 4-6 HOURS     SUMAtriptan  100 MG tablet  Commonly known as:  IMITREX  Take 100 mg by mouth as needed. For H/A     Vitamin D 2000 UNITS tablet  Take 2,000 Units by mouth daily.        Allergies:  Allergies  Allergen Reactions  . Demerol Other (See Comments)    Syncope  . Mefoxin [Cefoxitin Sodium In Dextrose]     rash  . Topamax Other (See Comments)    Had trouble walking  . Avelox [Moxifloxacin Hcl In Nacl]     Hypotension.    Past Medical History, Surgical history, Social history, and Family History were reviewed and updated.  Review of Systems: All other 10 point review of systems is negative.   Physical Exam:  height is '5\' 1"'$  (1.549 m) and weight is 126 lb (57.153 kg). Her oral temperature is 98.2 F (36.8 C). Her blood pressure is 110/71 and her pulse is 116. Her respiration is 20.   Wt Readings from Last 3 Encounters:  01/12/15 126 lb (57.153 kg)  12/31/14 126 lb (57.153 kg)  12/10/14 128 lb (58.06 kg)    Ocular: Sclerae unicteric, pupils equal, round and reactive to light Ear-nose-throat: Oropharynx clear, dentition fair Lymphatic: No cervical supraclavicular or axillary adenopathy Lungs no rales or rhonchi, good excursion bilaterally Heart regular rate and rhythm, no murmur appreciated Abd soft, nontender, positive bowel sounds MSK no focal spinal tenderness, no joint edema Neuro: non-focal, well-oriented, appropriate affect Breasts: Deferred  Lab Results  Component Value Date   WBC 10.3 01/11/2015   HGB 9.9* 01/11/2015   HCT 30.8* 01/11/2015   MCV 86.0 01/11/2015   PLT 229 01/11/2015   Lab Results  Component Value Date   FERRITIN 47 10/29/2013   IRON 64 10/29/2013   TIBC 233* 10/29/2013   UIBC 169 10/29/2013   IRONPCTSAT 27 10/29/2013   Lab Results  Component Value Date   RETICCTPCT 1.4 08/13/2008   RBC 3.58* 01/11/2015   RETICCTABS 61.6 08/13/2008   No results found for: KPAFRELGTCHN, LAMBDASER, KAPLAMBRATIO No results found for: IGGSERUM, IGA, IGMSERUM No  results found for: Ronnald Ramp, A1GS, A2GS, Violet Baldy, MSPIKE, SPEI   Chemistry      Component Value Date/Time   NA 134* 01/11/2015 0944   NA 141 12/31/2014 0912   K 3.7 01/11/2015 0944   K 3.7 12/31/2014  0912   CL 100* 01/11/2015 0944   CL 102 12/31/2014 0912   CO2 25 01/11/2015 0944   CO2 28 12/31/2014 0912   BUN 11 01/11/2015 0944   BUN 14 12/31/2014 0912   CREATININE 0.53 01/11/2015 0944   CREATININE 0.7 12/31/2014 0912      Component Value Date/Time   CALCIUM 8.8* 01/11/2015 0944   CALCIUM 9.8 12/31/2014 0912   ALKPHOS 75 01/11/2015 0944   ALKPHOS 77 12/31/2014 0912   AST 18 01/11/2015 0944   AST 17 12/31/2014 0912   ALT 10* 01/11/2015 0944   ALT 14 12/31/2014 0912   BILITOT 0.9 01/11/2015 0944   BILITOT 0.40 12/31/2014 0912     Impression and Plan: Ms. Minnifield is a pleasant 64 yo white female with metastatic adenocarcinoma of the lung. She was in the ED last night with c/o chest pain. Her Ct angio was negative for a PE but showed a small to moderate sized left basilar pleural effusion.  We will set her up for an US guided thoracentesis tomorrow (her preference) and will also do an Echo at that time.  Fluid will be sent off for cytology.  We will see what these results show and if this gives her some relief.  She will continue taking the dilaudid 1 mg PO q 4 hours PRN for pain.  She has her current appointment schedule and will see Dr. Marin Olp on December 15th.  She will contact us with any other questions or concerns. We can certainly see her sooner if need be.   Eliezer Bottom, NP 12/5/201612:43 PM

## 2015-01-12 NOTE — Telephone Encounter (Signed)
Patient went to the ED last PM and was told to follow up with the office today. She isn't feeling any better. Spoke to Dr Marin Olp who wants patient to come in and be assessed by the NP. Appointment made and patient notified.

## 2015-01-13 ENCOUNTER — Ambulatory Visit (HOSPITAL_COMMUNITY)
Admission: RE | Admit: 2015-01-13 | Discharge: 2015-01-13 | Disposition: A | Discharge status: Home / Self Care | Payer: PPO | Source: Ambulatory Visit | Attending: Family | Admitting: Family

## 2015-01-13 ENCOUNTER — Ambulatory Visit (HOSPITAL_COMMUNITY)
Admission: RE | Admit: 2015-01-13 | Discharge: 2015-01-13 | Disposition: A | Discharge status: Home / Self Care | Payer: PPO | Source: Ambulatory Visit | Attending: Radiology | Admitting: Radiology

## 2015-01-13 DIAGNOSIS — C3490 Malignant neoplasm of unspecified part of unspecified bronchus or lung: Secondary | ICD-10-CM | POA: Diagnosis present

## 2015-01-13 DIAGNOSIS — Z9889 Other specified postprocedural states: Secondary | ICD-10-CM

## 2015-01-13 DIAGNOSIS — J90 Pleural effusion, not elsewhere classified: Secondary | ICD-10-CM | POA: Diagnosis present

## 2015-01-13 DIAGNOSIS — C3432 Malignant neoplasm of lower lobe, left bronchus or lung: Secondary | ICD-10-CM

## 2015-01-13 NOTE — Procedures (Signed)
US guided diagnostic/therapeutic left thoracentesis performed yielding 350 cc turbid, amber fluid. The fluid was sent to the lab for preordered studies. F/u CXR pending. No immediate complications.

## 2015-01-14 ENCOUNTER — Other Ambulatory Visit (HOSPITAL_COMMUNITY): Payer: PPO

## 2015-01-15 ENCOUNTER — Telehealth: Payer: Self-pay | Admitting: *Deleted

## 2015-01-15 NOTE — Telephone Encounter (Signed)
Called patient to check on her after her thoracentesis on Tuesday. Patient is doing okay. Only 350cc were removed via the thoracentesis, patient reports they usually get more. She has pain at the needle puncture site. Pain medication is working well. Breathing is stable. She has no current needs. Informed patient to call office if she has any problems before her appointment next Thursday. She understood.

## 2015-01-16 ENCOUNTER — Ambulatory Visit (HOSPITAL_COMMUNITY)
Admission: RE | Admit: 2015-01-16 | Discharge: 2015-01-16 | Disposition: A | Discharge status: Home / Self Care | Payer: PPO | Source: Ambulatory Visit | Attending: Hematology & Oncology | Admitting: Hematology & Oncology

## 2015-01-16 DIAGNOSIS — Z87891 Personal history of nicotine dependence: Secondary | ICD-10-CM | POA: Insufficient documentation

## 2015-01-16 DIAGNOSIS — J90 Pleural effusion, not elsewhere classified: Secondary | ICD-10-CM

## 2015-01-16 DIAGNOSIS — J948 Other specified pleural conditions: Secondary | ICD-10-CM | POA: Diagnosis not present

## 2015-01-16 DIAGNOSIS — C3432 Malignant neoplasm of lower lobe, left bronchus or lung: Secondary | ICD-10-CM | POA: Diagnosis not present

## 2015-01-16 DIAGNOSIS — I10 Essential (primary) hypertension: Secondary | ICD-10-CM | POA: Diagnosis not present

## 2015-01-16 NOTE — Progress Notes (Signed)
  Echocardiogram 2D Echocardiogram has been performed.  [name] 01/16/2015, 10:43 AM

## 2015-01-17 LAB — BODY FLUID CULTURE
Culture: NO GROWTH
SPECIAL REQUESTS: NORMAL

## 2015-01-20 ENCOUNTER — Encounter: Payer: Self-pay | Admitting: Hematology & Oncology

## 2015-01-22 ENCOUNTER — Telehealth: Payer: Self-pay | Admitting: Hematology & Oncology

## 2015-01-22 ENCOUNTER — Encounter: Payer: Self-pay | Admitting: Hematology & Oncology

## 2015-01-22 ENCOUNTER — Ambulatory Visit (HOSPITAL_BASED_OUTPATIENT_CLINIC_OR_DEPARTMENT_OTHER): Payer: PPO

## 2015-01-22 ENCOUNTER — Ambulatory Visit (HOSPITAL_BASED_OUTPATIENT_CLINIC_OR_DEPARTMENT_OTHER)
Admission: RE | Admit: 2015-01-22 | Discharge: 2015-01-22 | Disposition: A | Discharge status: Home / Self Care | Payer: PPO | Source: Ambulatory Visit | Attending: Hematology & Oncology | Admitting: Hematology & Oncology

## 2015-01-22 ENCOUNTER — Other Ambulatory Visit (HOSPITAL_BASED_OUTPATIENT_CLINIC_OR_DEPARTMENT_OTHER): Payer: PPO

## 2015-01-22 ENCOUNTER — Ambulatory Visit (HOSPITAL_COMMUNITY)
Admission: RE | Admit: 2015-01-22 | Discharge: 2015-01-22 | Disposition: A | Discharge status: Home / Self Care | Payer: PPO | Source: Ambulatory Visit | Attending: Hematology & Oncology | Admitting: Hematology & Oncology

## 2015-01-22 ENCOUNTER — Ambulatory Visit (HOSPITAL_BASED_OUTPATIENT_CLINIC_OR_DEPARTMENT_OTHER): Payer: PPO | Admitting: Hematology & Oncology

## 2015-01-22 VITALS — BP 116/73 | HR 96 | Temp 97.6°F | Resp 18

## 2015-01-22 DIAGNOSIS — C3432 Malignant neoplasm of lower lobe, left bronchus or lung: Secondary | ICD-10-CM

## 2015-01-22 DIAGNOSIS — C3491 Malignant neoplasm of unspecified part of right bronchus or lung: Secondary | ICD-10-CM

## 2015-01-22 DIAGNOSIS — C3490 Malignant neoplasm of unspecified part of unspecified bronchus or lung: Secondary | ICD-10-CM

## 2015-01-22 DIAGNOSIS — R0600 Dyspnea, unspecified: Secondary | ICD-10-CM | POA: Diagnosis present

## 2015-01-22 DIAGNOSIS — J90 Pleural effusion, not elsewhere classified: Secondary | ICD-10-CM | POA: Diagnosis not present

## 2015-01-22 DIAGNOSIS — J910 Malignant pleural effusion: Secondary | ICD-10-CM | POA: Diagnosis not present

## 2015-01-22 LAB — CBC WITH DIFFERENTIAL (CANCER CENTER ONLY)
BASO#: 0 10*3/uL (ref 0.0–0.2)
BASO%: 0.3 % (ref 0.0–2.0)
EOS%: 0 % (ref 0.0–7.0)
Eosinophils Absolute: 0 10*3/uL (ref 0.0–0.5)
HEMATOCRIT: 30 % — AB (ref 34.8–46.6)
HGB: 9.4 g/dL — ABNORMAL LOW (ref 11.6–15.9)
LYMPH#: 0.7 10*3/uL — AB (ref 0.9–3.3)
LYMPH%: 6.8 % — AB (ref 14.0–48.0)
MCH: 27.6 pg (ref 26.0–34.0)
MCHC: 31.3 g/dL — AB (ref 32.0–36.0)
MCV: 88 fL (ref 81–101)
MONO#: 0.6 10*3/uL (ref 0.1–0.9)
MONO%: 5.9 % (ref 0.0–13.0)
NEUT#: 8.8 10*3/uL — ABNORMAL HIGH (ref 1.5–6.5)
NEUT%: 87 % — AB (ref 39.6–80.0)
PLATELETS: 685 10*3/uL — AB (ref 145–400)
RBC: 3.41 10*6/uL — ABNORMAL LOW (ref 3.70–5.32)
RDW: 17.2 % — AB (ref 11.1–15.7)
WBC: 10.1 10*3/uL — ABNORMAL HIGH (ref 3.9–10.0)

## 2015-01-22 LAB — CMP (CANCER CENTER ONLY)
ALK PHOS: 84 U/L (ref 26–84)
ALT(SGPT): 14 U/L (ref 10–47)
AST: 15 U/L (ref 11–38)
Albumin: 3 g/dL — ABNORMAL LOW (ref 3.3–5.5)
BUN: 13 mg/dL (ref 7–22)
CALCIUM: 9.7 mg/dL (ref 8.0–10.3)
CHLORIDE: 99 meq/L (ref 98–108)
CO2: 23 meq/L (ref 18–33)
Creat: 0.8 mg/dl (ref 0.6–1.2)
GLUCOSE: 124 mg/dL — AB (ref 73–118)
POTASSIUM: 3.6 meq/L (ref 3.3–4.7)
Sodium: 136 mEq/L (ref 128–145)
Total Bilirubin: 0.5 mg/dl (ref 0.20–1.60)
Total Protein: 8.2 g/dL — ABNORMAL HIGH (ref 6.4–8.1)

## 2015-01-22 LAB — ABO/RH: ABO/RH(D): A POS

## 2015-01-22 LAB — HOLD TUBE, BLOOD BANK - CHCC SATELLITE

## 2015-01-22 LAB — PREPARE RBC (CROSSMATCH)

## 2015-01-22 LAB — LACTATE DEHYDROGENASE: LDH: 195 U/L (ref 125–245)

## 2015-01-22 MED ORDER — SODIUM CHLORIDE 0.9 % IJ SOLN
10.0000 mL | INTRAMUSCULAR | Status: DC | PRN
  Administered 2015-01-22: 10 mL via INTRAVENOUS
  Filled 2015-01-22: qty 10

## 2015-01-22 MED ORDER — HEPARIN SOD (PORK) LOCK FLUSH 100 UNIT/ML IV SOLN
500.0000 [IU] | Freq: Once | INTRAVENOUS | Status: AC
  Administered 2015-01-22: 500 [IU] via INTRAVENOUS
  Filled 2015-01-22: qty 5

## 2015-01-22 NOTE — Telephone Encounter (Signed)
APPROVED Valid: 01/22/2015 - 07/23/2015 Auth: [id number]  Huey Bienenstock (Tecentriq) - [id number] [place] See - [phone]

## 2015-01-22 NOTE — Progress Notes (Signed)
Hematology and Oncology Follow Up Visit  [name] [id number] 1950-08-10 64 y.o. 01/22/2015   Principle Diagnosis:   Metastatic adenocarcinoma the lung  Current Therapy:    Patient s/p cycle #3 of Carboplatin/Alimta/Avastin  Xgeva 120 mg subcutaneous every month     Interim History:  [name] is back for follow-up. She's having a tough time again.   She was in the emergency room about a week or so ago. She's having some chest wall pain and. She was found having increasing left pleural effusion. She had this  Drained. 350 mL of fluid was removed. Unfortunately, the pathology showed that there was malignant cells.   I realize of the last PET scan looked better. However, I think she just is having a tough time with chemotherapy. Even with a reduced dose, I think she is having a tough time.   She really wants to have a better quality of life. As such, I think the best thing for [place] to do would be to try to switch treatments. I think one of the new  Immunotherapy agents would be appropriate. I note that the FDA recently approved the use of atezolizumab (Tecentriq)  for second line therapy in non-small cell lung cancer. I think this would have a much better toxicity profile.     We got a chest x-ray on her today. The chest x-ray showed no progression of the left pleural effusion.   Her appetite is down a little bit. Her husband says that she eats better on occasion.   She still is having some of the chest wall pain. She mostly complains of pain in the ribs bilaterally.   She's not having diarrhea. There is no constipation.   There is no bleeding.   Overall, her performance status is ECOG 1-2.  Overall, her performance status is ECOG 1.  Medications:  Current outpatient prescriptions:  .  aspirin 81 MG tablet, Take 81 mg by mouth daily.  , Disp: , Rfl:  .  calcium-vitamin D (OSCAL) 250-125 MG-UNIT per tablet, Take 1 tablet by mouth daily.  , Disp: , Rfl:  .   Cholecalciferol (VITAMIN D) 2000 UNITS tablet, Take 2,000 Units by mouth daily., Disp: , Rfl:  .  citalopram (CELEXA) 20 MG tablet, Take 1 tablet (20 mg total) by mouth daily., Disp: 30 tablet, Rfl: 6 .  dexamethasone (DECADRON) 4 MG tablet, Take 1 tab two times a day the day before Alimta chemo. Take 2 tabs two times a day starting the day after chemo for 3 days., Disp: 30 tablet, Rfl: 1 .  fish oil-omega-3 fatty acids 1000 MG capsule, Take 2 g by mouth daily.  , Disp: , Rfl:  .  folic acid (FOLVITE) 1 MG tablet, Take 1 tablet (1 mg total) by mouth daily. Start 5-7 days before Alimta chemotherapy. Continue until 21 days after Alimta completed., Disp: 100 tablet, Rfl: 3 .  gabapentin (NEURONTIN) 300 MG capsule, Take 1 capsule (300 mg total) by mouth 3 (three) times daily., Disp: 90 capsule, Rfl: 2 .  HYDROmorphone (DILAUDID) 2 MG tablet, Take 0.5 tablets (1 mg total) by mouth every 4 (four) hours as needed for severe pain. (Patient not taking: Reported on 01/22/2015), Disp: 30 tablet, Rfl: 0 .  KLOR-CON M20 20 MEQ tablet, TAKE 1 TABLET BY MOUTH DAILY, Disp: 30 tablet, Rfl: 3 .  lactulose (CHRONULAC) 10 GM/15ML solution, Take 2 tablespoons 3x a day., Disp: 1000 mL, Rfl: 6 .  lidocaine-prilocaine (EMLA) cream, Apply to  affected area once, Disp: 30 g, Rfl: 3 .  LORazepam (ATIVAN) 0.5 MG tablet, Take 1 tablet (0.5 mg total) by mouth every 6 (six) hours as needed (Nausea or vomiting)., Disp: 30 tablet, Rfl: 0 .  megestrol (MEGACE) 400 MG/10ML suspension, Take 20 mLs (800 mg total) by mouth daily., Disp: 240 mL, Rfl: 0 .  omeprazole (PRILOSEC) 40 MG capsule, Take 40 mg by mouth 2 (two) times daily. , Disp: , Rfl: 3 .  ondansetron (ZOFRAN) 8 MG tablet, Take 1 tablet (8 mg total) by mouth 2 (two) times daily. Start the day after chemo for 3 days. Then take as needed for nausea or vomiting., Disp: 30 tablet, Rfl: 1 .  oxyCODONE (OXY IR/ROXICODONE) 5 MG immediate release tablet, Take 1-2 pills, if needed, every  4 hours for pain secondary to cancer., Disp: 90 tablet, Rfl: 0 .  PROAIR RESPICLICK 170 (90 BASE) MCG/ACT AEPB, USE 2 PUFFS EVERY 4-6 HOURS, Disp: , Rfl: 0 .  prochlorperazine (COMPAZINE) 10 MG tablet, Take 1 tablet (10 mg total) by mouth every 6 (six) hours as needed (Nausea or vomiting)., Disp: 30 tablet, Rfl: 1 .  PROVENTIL HFA 108 (90 BASE) MCG/ACT inhaler, as needed. , Disp: , Rfl:  .  SUMAtriptan (IMITREX) 100 MG tablet, Take 100 mg by mouth as needed. For H/A, Disp: , Rfl:  No current facility-administered medications for this visit.  Facility-Administered Medications Ordered in Other Visits:  .  sodium chloride 0.9 % injection 10 mL, 10 mL, Intravenous, PRN, Golden Pop, FNP, 10 mL at 12/06/13 1149 .  sodium chloride 0.9 % injection 10 mL, 10 mL, Intravenous, PRN, Volanda Napoleon, MD, 10 mL at 01/22/15 1513  Allergies:  Allergies  Allergen Reactions  . Demerol Other (See Comments)    Syncope  . Mefoxin [Cefoxitin Sodium In Dextrose]     rash  . Topamax Other (See Comments)    Had trouble walking  . Avelox [Moxifloxacin Hcl In Nacl]     Hypotension.    Past Medical History, Surgical history, Social history, and Family History were reviewed and updated.  Review of Systems: As above  Physical Exam:  oral temperature is 97.6 F (36.4 C). Her blood pressure is 116/73 and her pulse is 96. Her respiration is 18 and oxygen saturation is 96%.   Wt Readings from Last 3 Encounters:  01/12/15 126 lb (57.153 kg)  12/31/14 126 lb (57.153 kg)  12/10/14 128 lb (58.06 kg)     Well-developed and well-nourished white female in no obvious distress. Head and neck exam shows no ocular or oral lesions. She has no palpable cervical or supraclavicular lymph nodes. Lungs are clear to percussion and auscultation on the right side. She has decreased breath sounds on the left side at the base of the left lung.. . Cardiac exam regular rate and rhythm with no murmurs, rubs or bruits. Abdomen is  soft. She has good bowel sounds. There is no fluid wave. There is no palpable liver or spleen tip. Back exam shows no tenderness over the spine, ribs or hips. Extremities shows no clubbing, cyanosis or edema. Neurological exam shows no focal neurological deficits.  Lab Results  Component Value Date   WBC 10.1* 01/22/2015   HGB 9.4* 01/22/2015   HCT 30.0* 01/22/2015   MCV 88 01/22/2015   PLT 685* 01/22/2015     Chemistry      Component Value Date/Time   NA 136 01/22/2015 1209   NA 134* 01/11/2015 0944  K 3.6 01/22/2015 1209   K 3.7 01/11/2015 0944   CL 99 01/22/2015 1209   CL 100* 01/11/2015 0944   CO2 23 01/22/2015 1209   CO2 25 01/11/2015 0944   BUN 13 01/22/2015 1209   BUN 11 01/11/2015 0944   CREATININE 0.8 01/22/2015 1209   CREATININE 0.53 01/11/2015 0944      Component Value Date/Time   CALCIUM 9.7 01/22/2015 1209   CALCIUM 8.8* 01/11/2015 0944   ALKPHOS 84 01/22/2015 1209   ALKPHOS 75 01/11/2015 0944   AST 15 01/22/2015 1209   AST 18 01/11/2015 0944   ALT 14 01/22/2015 1209   ALT 10* 01/11/2015 0944   BILITOT 0.50 01/22/2015 1209   BILITOT 0.9 01/11/2015 0944         Impression and Plan: [name] is a 64 year old white female.  I just feel bad that she does not feel all that well. As such, I think a change is necessary and her protocol so that she has a better quality of life.   we will see about getting the Tecentriq  Approved for her. I would think that her insurance company will have no problems with this since this is FDA approved.   I will hold off on Avastin.    I think that she would benefit from one unit of blood. Imdur hemoglobin is 9.4, I really think that 1 unit will make her feel better. We will see about transfusing her tomorrow.   We will also give her Xgeva.   Hopefully, we can do the Tecentriq next week.  I spent about 35 minutes with she and her husband today.   I will like to see her back in 3 weeks.   Volanda Napoleon,  MD 12/15/20166:06 PM

## 2015-01-22 NOTE — Patient Instructions (Signed)
Implanted Port Insertion, Care After °Refer to this sheet in the next few weeks. These instructions provide you with information on caring for yourself after your procedure. Your health care provider may also give you more specific instructions. Your treatment has been planned according to current medical practices, but problems sometimes occur. Call your health care provider if you have any problems or questions after your procedure. °WHAT TO EXPECT AFTER THE PROCEDURE °After your procedure, it is typical to have the following:  °· Discomfort at the port insertion site. Ice packs to the area will help. °· Bruising on the skin over the port. This will subside in 3-4 days. °HOME CARE INSTRUCTIONS °· After your port is placed, you will get a manufacturer's information card. The card has information about your port. Keep this card with you at all times.   °· Know what kind of port you have. There are many types of ports available.   °· Wear a medical alert bracelet in case of an emergency. This can help alert health care workers that you have a port.   °· The port can stay in for as long as your health care provider believes it is necessary.   °· A home health care nurse may give medicines and take care of the port.   °· You or a family member can get special training and directions for giving medicine and taking care of the port at home.   °SEEK MEDICAL CARE IF:  °· Your port does not flush or you are unable to get a blood return.   °· You have a fever or chills. °SEEK IMMEDIATE MEDICAL CARE IF: °· You have new fluid or pus coming from your incision.   °· You notice a bad smell coming from your incision site.   °· You have swelling, pain, or more redness at the incision or port site.   °· You have chest pain or shortness of breath. °  °This information is not intended to replace advice given to you by your health care provider. Make sure you discuss any questions you have with your health care provider. °  °Document  Released: 11/14/2012 Document Revised: 01/29/2013 Document Reviewed: 11/14/2012 °Elsevier Interactive Patient Education ©2016 [employer] ° °

## 2015-01-23 ENCOUNTER — Ambulatory Visit (HOSPITAL_BASED_OUTPATIENT_CLINIC_OR_DEPARTMENT_OTHER): Payer: PPO

## 2015-01-23 ENCOUNTER — Other Ambulatory Visit: Payer: Self-pay | Admitting: *Deleted

## 2015-01-23 ENCOUNTER — Other Ambulatory Visit (HOSPITAL_BASED_OUTPATIENT_CLINIC_OR_DEPARTMENT_OTHER): Payer: PPO

## 2015-01-23 VITALS — BP 115/77 | HR 92 | Temp 98.0°F | Resp 16

## 2015-01-23 DIAGNOSIS — C3491 Malignant neoplasm of unspecified part of right bronchus or lung: Secondary | ICD-10-CM | POA: Diagnosis not present

## 2015-01-23 DIAGNOSIS — R0600 Dyspnea, unspecified: Secondary | ICD-10-CM | POA: Diagnosis not present

## 2015-01-23 LAB — URINALYSIS, MICROSCOPIC (CHCC SATELLITE)
BILIRUBIN (URINE): NEGATIVE
Blood: NEGATIVE
GLUCOSE UR: NEGATIVE mg/dL
KETONES: NEGATIVE mg/dL
Leukocyte Esterase: NEGATIVE
Nitrite: NEGATIVE
PH: 6.5 (ref 4.60–8.00)
PROTEIN: NEGATIVE mg/dL
SPECIFIC GRAVITY, URINE: 1.01 (ref 1.003–1.035)
Urobilinogen, UR: 0.2 mg/dL (ref 0.2–1)

## 2015-01-23 LAB — PREALBUMIN: Prealbumin: 11 mg/dL — ABNORMAL LOW (ref 17–34)

## 2015-01-23 MED ORDER — SODIUM CHLORIDE 0.9 % IV SOLN
250.0000 mL | Freq: Once | INTRAVENOUS | Status: AC
  Administered 2015-01-23: 250 mL via INTRAVENOUS

## 2015-01-23 MED ORDER — HEPARIN SOD (PORK) LOCK FLUSH 100 UNIT/ML IV SOLN
500.0000 [IU] | Freq: Every day | INTRAVENOUS | Status: AC | PRN
Start: 2015-01-23 — End: 2015-01-23
  Administered 2015-01-23: 500 [IU]
  Filled 2015-01-23: qty 5

## 2015-01-23 MED ORDER — FUROSEMIDE 10 MG/ML IJ SOLN: 20.0000 mg | Freq: Once | INTRAMUSCULAR | Status: DC

## 2015-01-23 MED ORDER — DIPHENHYDRAMINE HCL 25 MG PO CAPS
25.0000 mg | ORAL_CAPSULE | Freq: Once | ORAL | Status: AC
  Administered 2015-01-23: 25 mg via ORAL

## 2015-01-23 MED ORDER — DIPHENHYDRAMINE HCL 25 MG PO CAPS
ORAL_CAPSULE | ORAL | Status: AC
  Filled 2015-01-23: qty 1

## 2015-01-23 MED ORDER — ACETAMINOPHEN 325 MG PO TABS
650.0000 mg | ORAL_TABLET | Freq: Once | ORAL | Status: AC
  Administered 2015-01-23: 650 mg via ORAL

## 2015-01-23 NOTE — Patient Instructions (Signed)

## 2015-01-24 LAB — TYPE AND SCREEN
ABO/RH(D): A POS
ANTIBODY SCREEN: NEGATIVE
UNIT DIVISION: 0

## 2015-01-24 LAB — URINE CULTURE

## 2015-01-26 ENCOUNTER — Telehealth: Payer: Self-pay | Admitting: *Deleted

## 2015-01-26 ENCOUNTER — Telehealth: Payer: Self-pay | Admitting: Hematology & Oncology

## 2015-01-26 NOTE — Telephone Encounter (Addendum)
Patient is aware of results  ----- Message from Volanda Napoleon, MD sent at 01/23/2015  5:02 PM EST ----- Call - urine looks ok!!  No obvious infection!  pete

## 2015-01-28 ENCOUNTER — Other Ambulatory Visit (HOSPITAL_BASED_OUTPATIENT_CLINIC_OR_DEPARTMENT_OTHER): Payer: PPO

## 2015-01-28 ENCOUNTER — Other Ambulatory Visit: Payer: Self-pay

## 2015-01-28 ENCOUNTER — Ambulatory Visit (HOSPITAL_BASED_OUTPATIENT_CLINIC_OR_DEPARTMENT_OTHER): Payer: PPO

## 2015-01-28 VITALS — BP 105/77 | HR 109 | Temp 98.3°F | Resp 18

## 2015-01-28 DIAGNOSIS — C3491 Malignant neoplasm of unspecified part of right bronchus or lung: Secondary | ICD-10-CM

## 2015-01-28 DIAGNOSIS — Z5112 Encounter for antineoplastic immunotherapy: Secondary | ICD-10-CM

## 2015-01-28 DIAGNOSIS — R0600 Dyspnea, unspecified: Secondary | ICD-10-CM | POA: Diagnosis not present

## 2015-01-28 LAB — CBC WITH DIFFERENTIAL (CANCER CENTER ONLY)
BASO#: 0.1 10*3/uL (ref 0.0–0.2)
BASO%: 0.4 % (ref 0.0–2.0)
EOS ABS: 0 10*3/uL (ref 0.0–0.5)
EOS%: 0.2 % (ref 0.0–7.0)
HCT: 35.2 % (ref 34.8–46.6)
HEMOGLOBIN: 11.2 g/dL — AB (ref 11.6–15.9)
LYMPH#: 1.7 10*3/uL (ref 0.9–3.3)
LYMPH%: 13.7 % — ABNORMAL LOW (ref 14.0–48.0)
MCH: 28.1 pg (ref 26.0–34.0)
MCHC: 31.8 g/dL — ABNORMAL LOW (ref 32.0–36.0)
MCV: 88 fL (ref 81–101)
MONO#: 1.4 10*3/uL — AB (ref 0.1–0.9)
MONO%: 11.6 % (ref 0.0–13.0)
NEUT%: 74.1 % (ref 39.6–80.0)
NEUTROS ABS: 9.1 10*3/uL — AB (ref 1.5–6.5)
Platelets: 510 10*3/uL — ABNORMAL HIGH (ref 145–400)
RBC: 3.98 10*6/uL (ref 3.70–5.32)
RDW: 17.5 % — ABNORMAL HIGH (ref 11.1–15.7)
WBC: 12.2 10*3/uL — ABNORMAL HIGH (ref 3.9–10.0)

## 2015-01-28 LAB — CMP (CANCER CENTER ONLY)
ALT: 15 U/L (ref 10–47)
AST: 15 U/L (ref 11–38)
Albumin: 3.1 g/dL — ABNORMAL LOW (ref 3.3–5.5)
Alkaline Phosphatase: 71 U/L (ref 26–84)
BILIRUBIN TOTAL: 0.6 mg/dL (ref 0.20–1.60)
BUN: 14 mg/dL (ref 7–22)
CHLORIDE: 100 meq/L (ref 98–108)
CO2: 27 meq/L (ref 18–33)
CREATININE: 0.7 mg/dL (ref 0.6–1.2)
Calcium: 8.6 mg/dL (ref 8.0–10.3)
Glucose, Bld: 154 mg/dL — ABNORMAL HIGH (ref 73–118)
Potassium: 3.4 mEq/L (ref 3.3–4.7)
Sodium: 137 mEq/L (ref 128–145)
TOTAL PROTEIN: 7.4 g/dL (ref 6.4–8.1)

## 2015-01-28 MED ORDER — HEPARIN SOD (PORK) LOCK FLUSH 100 UNIT/ML IV SOLN
500.0000 [IU] | Freq: Once | INTRAVENOUS | Status: AC | PRN
  Administered 2015-01-28: 500 [IU]
  Filled 2015-01-28: qty 5

## 2015-01-28 MED ORDER — SODIUM CHLORIDE 0.9 % IV SOLN
Freq: Once | INTRAVENOUS | Status: AC
  Administered 2015-01-28: 12:00:00 via INTRAVENOUS

## 2015-01-28 MED ORDER — SODIUM CHLORIDE 0.9 % IJ SOLN
10.0000 mL | INTRAMUSCULAR | Status: DC | PRN
  Administered 2015-01-28: 10 mL
  Filled 2015-01-28: qty 10

## 2015-01-28 MED ORDER — SODIUM CHLORIDE 0.9 % IV SOLN
1200.0000 mg | Freq: Once | INTRAVENOUS | Status: AC
  Administered 2015-01-28: 1200 mg via INTRAVENOUS
  Filled 2015-01-28: qty 20

## 2015-01-28 NOTE — Patient Instructions (Signed)
[facility] Discharge Instructions for Patients Receiving Chemotherapy  Today you received the following chemotherapy agents Tecentriq  To help prevent nausea and vomiting after your treatment, we encourage you to take your nausea medication   If you develop nausea and vomiting that is not controlled by your nausea medication, call the clinic.   BELOW ARE SYMPTOMS THAT SHOULD BE REPORTED IMMEDIATELY:  *FEVER GREATER THAN 100.5 F  *CHILLS WITH OR WITHOUT FEVER  NAUSEA AND VOMITING THAT IS NOT CONTROLLED WITH YOUR NAUSEA MEDICATION  *UNUSUAL SHORTNESS OF BREATH  *UNUSUAL BRUISING OR BLEEDING  TENDERNESS IN MOUTH AND THROAT WITH OR WITHOUT PRESENCE OF ULCERS  *URINARY PROBLEMS  *BOWEL PROBLEMS  UNUSUAL RASH Items with * indicate a potential emergency and should be followed up as soon as possible.  Feel free to call the clinic you have any questions or concerns. The clinic phone number is [phone].  Please show the CHEMO ALERT CARD at check-in to the Emergency Department and triage nurse.   

## 2015-01-29 LAB — TSH: TSH: 0.864 m(IU)/L (ref 0.308–3.960)

## 2015-01-29 NOTE — Telephone Encounter (Signed)
na

## 2015-01-30 ENCOUNTER — Telehealth: Payer: Self-pay | Admitting: Emergency Medicine

## 2015-01-30 ENCOUNTER — Encounter: Payer: Self-pay | Admitting: Hematology & Oncology

## 2015-01-30 ENCOUNTER — Telehealth: Payer: Self-pay | Admitting: *Deleted

## 2015-01-30 NOTE — Telephone Encounter (Signed)
Patient reporting increased nausea and vomiting. She has tried compazine and zofran. She is currently being treated for flu and chest cold with tamiflu and an antibiotic. Patient has ativan at home. She was instructed to use ativan for her nausea and to place the medication under her tongue.   Reviewed everything with Dr Marin Olp. He feels patient has appropriate medication coverage at home. He wants her to continue using the ativan SL and to increase fluid intake with water, ice or frozen liquids. She understood instruction.  She was also instructed to call the on-call service if symptoms worsened over the weekend, or be seen at an urgent care or ED. She understood.

## 2015-01-30 NOTE — Telephone Encounter (Signed)
Patient complaining of "upper chest pain" states all the way across her chest and on the right side of chest. States worse when cold. States increased pain with breathing. Difficult to take a deep breath due to pain. States saw PCP and given abx, tamiflu and cough med. Discussed with Dr Marin Olp and he advised that she can take a Dilaudid or Oxycodone for the pain; as she has only taken Ibuprofen for the pain. Patient verbalized understanding.

## 2015-02-10 ENCOUNTER — Encounter: Payer: Self-pay | Admitting: Family

## 2015-02-10 ENCOUNTER — Other Ambulatory Visit (HOSPITAL_BASED_OUTPATIENT_CLINIC_OR_DEPARTMENT_OTHER): Payer: PPO

## 2015-02-10 ENCOUNTER — Ambulatory Visit (HOSPITAL_BASED_OUTPATIENT_CLINIC_OR_DEPARTMENT_OTHER)
Admission: RE | Admit: 2015-02-10 | Discharge: 2015-02-10 | Disposition: A | Discharge status: Home / Self Care | Payer: PPO | Source: Ambulatory Visit | Attending: Hematology & Oncology | Admitting: Hematology & Oncology

## 2015-02-10 ENCOUNTER — Other Ambulatory Visit: Payer: Self-pay | Admitting: *Deleted

## 2015-02-10 ENCOUNTER — Telehealth: Payer: Self-pay | Admitting: *Deleted

## 2015-02-10 ENCOUNTER — Ambulatory Visit (HOSPITAL_BASED_OUTPATIENT_CLINIC_OR_DEPARTMENT_OTHER): Payer: PPO | Admitting: Family

## 2015-02-10 VITALS — BP 97/70 | HR 103 | Temp 98.2°F | Resp 18

## 2015-02-10 DIAGNOSIS — R059 Cough, unspecified: Secondary | ICD-10-CM

## 2015-02-10 DIAGNOSIS — R05 Cough: Secondary | ICD-10-CM

## 2015-02-10 DIAGNOSIS — Z9221 Personal history of antineoplastic chemotherapy: Secondary | ICD-10-CM | POA: Insufficient documentation

## 2015-02-10 DIAGNOSIS — M8448XA Pathological fracture, other site, initial encounter for fracture: Secondary | ICD-10-CM | POA: Diagnosis not present

## 2015-02-10 DIAGNOSIS — R0789 Other chest pain: Secondary | ICD-10-CM | POA: Diagnosis not present

## 2015-02-10 DIAGNOSIS — C3490 Malignant neoplasm of unspecified part of unspecified bronchus or lung: Secondary | ICD-10-CM

## 2015-02-10 DIAGNOSIS — R0781 Pleurodynia: Secondary | ICD-10-CM | POA: Diagnosis not present

## 2015-02-10 DIAGNOSIS — C3411 Malignant neoplasm of upper lobe, right bronchus or lung: Secondary | ICD-10-CM | POA: Diagnosis not present

## 2015-02-10 DIAGNOSIS — R0602 Shortness of breath: Secondary | ICD-10-CM | POA: Insufficient documentation

## 2015-02-10 DIAGNOSIS — J0110 Acute frontal sinusitis, unspecified: Secondary | ICD-10-CM

## 2015-02-10 DIAGNOSIS — C3491 Malignant neoplasm of unspecified part of right bronchus or lung: Secondary | ICD-10-CM

## 2015-02-10 LAB — CMP (CANCER CENTER ONLY)
ALT(SGPT): 12 U/L (ref 10–47)
AST: 18 U/L (ref 11–38)
Albumin: 3.1 g/dL — ABNORMAL LOW (ref 3.3–5.5)
Alkaline Phosphatase: 67 U/L (ref 26–84)
BILIRUBIN TOTAL: 0.6 mg/dL (ref 0.20–1.60)
BUN: 9 mg/dL (ref 7–22)
CO2: 27 meq/L (ref 18–33)
CREATININE: 0.7 mg/dL (ref 0.6–1.2)
Calcium: 8.7 mg/dL (ref 8.0–10.3)
Chloride: 101 mEq/L (ref 98–108)
GLUCOSE: 99 mg/dL (ref 73–118)
Potassium: 4.1 mEq/L (ref 3.3–4.7)
SODIUM: 136 meq/L (ref 128–145)
Total Protein: 7.8 g/dL (ref 6.4–8.1)

## 2015-02-10 LAB — CBC WITH DIFFERENTIAL (CANCER CENTER ONLY)
BASO#: 0.1 10*3/uL (ref 0.0–0.2)
BASO%: 1.4 % (ref 0.0–2.0)
EOS%: 1.2 % (ref 0.0–7.0)
Eosinophils Absolute: 0.1 10*3/uL (ref 0.0–0.5)
HCT: 34.6 % — ABNORMAL LOW (ref 34.8–46.6)
HGB: 11 g/dL — ABNORMAL LOW (ref 11.6–15.9)
LYMPH#: 1.6 10*3/uL (ref 0.9–3.3)
LYMPH%: 19 % (ref 14.0–48.0)
MCH: 27.3 pg (ref 26.0–34.0)
MCHC: 31.8 g/dL — AB (ref 32.0–36.0)
MCV: 86 fL (ref 81–101)
MONO#: 0.9 10*3/uL (ref 0.1–0.9)
MONO%: 11 % (ref 0.0–13.0)
NEUT%: 67.4 % (ref 39.6–80.0)
NEUTROS ABS: 5.7 10*3/uL (ref 1.5–6.5)
Platelets: 394 10*3/uL (ref 145–400)
RBC: 4.03 10*6/uL (ref 3.70–5.32)
RDW: 17.1 % — AB (ref 11.1–15.7)
WBC: 8.5 10*3/uL (ref 3.9–10.0)

## 2015-02-10 MED ORDER — AZITHROMYCIN 250 MG PO TABS: ORAL_TABLET | ORAL | Status: DC

## 2015-02-10 MED ORDER — HYDROCODONE-HOMATROPINE 5-1.5 MG/5ML PO SYRP
5.0000 mL | ORAL_SOLUTION | ORAL | Status: DC | PRN
Start: 2015-02-10 — End: 2015-04-01

## 2015-02-10 NOTE — Telephone Encounter (Signed)
Patient called stating she is having shortness of breath and pain in right rib area.  Kara Peace NP to see patient today per dr. Marin Olp and CXR

## 2015-02-10 NOTE — Progress Notes (Signed)
Hematology and Oncology Follow Up Visit  [name] [id number] [birth year] 65 y.o. 02/10/2015   Principle Diagnosis:  Metastatic adenocarcinoma the lung  Current Therapy:   Tecentriq s/p cycle 1  Xgeva 120 mg subcutaneous every month    Interim History:  [name] is here today with complaint of pain across the chest wall and around her right side in her ribs. Chest xray showed a chronic right lateral seventh rib fracture. No pneumonia or pleural effusion noted.  She has had a dry cough and sinus drainage for a week or so. She was taking cough syrup at home but this has not given her any relief.  She has some SOB with any exertion which resolves with taking a break to rest.  No fever, chills, n/v, rash, dizziness, palpitations, abdominal pain or changes in bowel or bladder habits.  No lymphadenopathy found on exam. No episodes of bleeding or bruising.  Her appetite has been "ok" and she is staying hydrated. No swelling, tenderness, numbness or tingling in her extremities.  Medications:    Medication List       This list is accurate as of: 02/10/15 10:46 AM.  Always use your most recent med list.               aspirin 81 MG tablet  Take 81 mg by mouth daily.     calcium-vitamin D 250-125 MG-UNIT tablet  Commonly known as:  OSCAL  Take 1 tablet by mouth daily.     citalopram 20 MG tablet  Commonly known as:  CELEXA  Take 1 tablet (20 mg total) by mouth daily.     dexamethasone 4 MG tablet  Commonly known as:  DECADRON  Take 1 tab two times a day the day before Alimta chemo. Take 2 tabs two times a day starting the day after chemo for 3 days.     fish oil-omega-3 fatty acids 1000 MG capsule  Take 2 g by mouth daily.     folic acid 1 MG tablet  Commonly known as:  FOLVITE  Take 1 tablet (1 mg total) by mouth daily. Start 5-7 days before Alimta chemotherapy. Continue until 21 days after Alimta completed.     gabapentin 300 MG capsule  Commonly known as:  NEURONTIN    Take 1 capsule (300 mg total) by mouth 3 (three) times daily.     HYDROmorphone 2 MG tablet  Commonly known as:  DILAUDID  Take 0.5 tablets (1 mg total) by mouth every 4 (four) hours as needed for severe pain.     KLOR-CON M20 20 MEQ tablet  Generic drug:  potassium chloride SA  TAKE 1 TABLET BY MOUTH DAILY     lactulose 10 GM/15ML solution  Commonly known as:  CHRONULAC  Take 2 tablespoons 3x a day.     lidocaine-prilocaine cream  Commonly known as:  EMLA  Apply to affected area once     LORazepam 0.5 MG tablet  Commonly known as:  ATIVAN  Take 1 tablet (0.5 mg total) by mouth every 6 (six) hours as needed (Nausea or vomiting).     megestrol 400 MG/10ML suspension  Commonly known as:  MEGACE  Take 20 mLs (800 mg total) by mouth daily.     omeprazole 40 MG capsule  Commonly known as:  PRILOSEC  Take 40 mg by mouth 2 (two) times daily.     ondansetron 8 MG tablet  Commonly known as:  ZOFRAN  Take 1 tablet (8 mg  total) by mouth 2 (two) times daily. Start the day after chemo for 3 days. Then take as needed for nausea or vomiting.     oxyCODONE 5 MG immediate release tablet  Commonly known as:  Oxy IR/ROXICODONE  Take 1-2 pills, if needed, every 4 hours for pain secondary to cancer.     prochlorperazine 10 MG tablet  Commonly known as:  COMPAZINE  Take 1 tablet (10 mg total) by mouth every 6 (six) hours as needed (Nausea or vomiting).     PROVENTIL HFA 108 (90 Base) MCG/ACT inhaler  Generic drug:  albuterol  as needed.     PROAIR RESPICLICK 676 (90 Base) MCG/ACT Aepb  Generic drug:  Albuterol Sulfate  USE 2 PUFFS EVERY 4-6 HOURS     SUMAtriptan 100 MG tablet  Commonly known as:  IMITREX  Take 100 mg by mouth as needed. For H/A     Vitamin D 2000 units tablet  Take 2,000 Units by mouth daily.        Allergies:  Allergies  Allergen Reactions  . Demerol Other (See Comments)    Syncope  . Mefoxin [Cefoxitin Sodium In Dextrose]     rash  . Topamax Other  (See Comments)    Had trouble walking  . Avelox [Moxifloxacin Hcl In Nacl]     Hypotension.    Past Medical History, Surgical history, Social history, and Family History were reviewed and updated.  Review of Systems: All other 10 point review of systems is negative.   Physical Exam:  vitals were not taken for this visit.  Wt Readings from Last 3 Encounters:  01/12/15 126 lb (57.153 kg)  12/31/14 126 lb (57.153 kg)  12/10/14 128 lb (58.06 kg)    Ocular: Sclerae unicteric, pupils equal, round and reactive to light Ear-nose-throat: Oropharynx clear, dentition fair Lymphatic: No cervical supraclavicular or axillary adenopathy Lungs no rales or rhonchi, good excursion bilaterally Heart regular rate and rhythm, no murmur appreciated Abd soft, nontender, positive bowel sounds, no liver or spleen tip palpated on exam MSK no focal spinal tenderness, no joint edema Neuro: non-focal, well-oriented, appropriate affect Breasts: Deferred  Lab Results  Component Value Date   WBC 12.2* 01/28/2015   HGB 11.2* 01/28/2015   HCT 35.2 01/28/2015   MCV 88 01/28/2015   PLT 510* 01/28/2015   Lab Results  Component Value Date   FERRITIN 47 10/29/2013   IRON 64 10/29/2013   TIBC 233* 10/29/2013   UIBC 169 10/29/2013   IRONPCTSAT 27 10/29/2013   Lab Results  Component Value Date   RETICCTPCT 1.4 08/13/2008   RBC 3.98 01/28/2015   RETICCTABS 61.6 08/13/2008   No results found for: KPAFRELGTCHN, LAMBDASER, KAPLAMBRATIO No results found for: IGGSERUM, IGA, IGMSERUM No results found for: [place]det[place] Pink, SPEI   Chemistry      Component Value Date/Time   NA 137 01/28/2015 1121   NA 134* 01/11/2015 0944   K 3.4 01/28/2015 1121   K 3.7 01/11/2015 0944   CL 100 01/28/2015 1121   CL 100* 01/11/2015 0944   CO2 27 01/28/2015 1121   CO2 25 01/11/2015 0944   BUN 14 01/28/2015 1121   BUN 11 01/11/2015 0944   CREATININE 0.7 01/28/2015 1121    CREATININE 0.53 01/11/2015 0944      Component Value Date/Time   CALCIUM 8.6 01/28/2015 1121   CALCIUM 8.8* 01/11/2015 0944   ALKPHOS 71 01/28/2015 1121   ALKPHOS 75 01/11/2015 0944  AST 15 01/28/2015 1121   AST 18 01/11/2015 0944   ALT 15 01/28/2015 1121   ALT 10* 01/11/2015 0944   BILITOT 0.60 01/28/2015 1121   BILITOT 0.9 01/11/2015 0944     Impression and Plan: Ms. [name] is a pleasant 65 yo white female with metastatic adenocarcinoma of the lung. She is here today with c/o pain across the chest wall and into the right side and ribcage. We got an xray today whih showed a chronic right lateral seventh rib fracture. Pain from this could certainly have been exacerbated by her dry cough.  We will have her take hycodan for her cough. This will also help some with pain relief.  We will also have her start a z-pack for her sinusitis. She will call us on Thursday and let us know how she is feeling.  Her CBC and CMP today look good.  She has an appointment next week for follow-up and cycle 2 of treatment with Tecentriq we will keep this appointment.  She will contact us with any other questions or concerns. We can certainly see her sooner if need be.   Eliezer Bottom, NP 1/3/201710:46 AM

## 2015-02-11 ENCOUNTER — Ambulatory Visit: Payer: PPO | Admitting: Hematology & Oncology

## 2015-02-11 ENCOUNTER — Ambulatory Visit: Payer: PPO

## 2015-02-11 ENCOUNTER — Other Ambulatory Visit: Payer: PPO

## 2015-02-13 ENCOUNTER — Encounter: Payer: Self-pay | Admitting: Hematology & Oncology

## 2015-02-18 ENCOUNTER — Ambulatory Visit: Payer: PPO

## 2015-02-18 ENCOUNTER — Other Ambulatory Visit (HOSPITAL_BASED_OUTPATIENT_CLINIC_OR_DEPARTMENT_OTHER): Payer: PPO

## 2015-02-18 ENCOUNTER — Ambulatory Visit (HOSPITAL_BASED_OUTPATIENT_CLINIC_OR_DEPARTMENT_OTHER): Payer: PPO

## 2015-02-18 ENCOUNTER — Other Ambulatory Visit: Payer: PPO

## 2015-02-18 ENCOUNTER — Ambulatory Visit (HOSPITAL_BASED_OUTPATIENT_CLINIC_OR_DEPARTMENT_OTHER): Payer: PPO | Admitting: Family

## 2015-02-18 ENCOUNTER — Other Ambulatory Visit: Payer: Self-pay | Admitting: Hematology & Oncology

## 2015-02-18 ENCOUNTER — Encounter: Payer: Self-pay | Admitting: Family

## 2015-02-18 VITALS — BP 109/57 | HR 102 | Temp 98.8°F | Resp 18 | Ht 61.0 in | Wt 125.0 lb

## 2015-02-18 DIAGNOSIS — M8448XA Pathological fracture, other site, initial encounter for fracture: Secondary | ICD-10-CM | POA: Diagnosis not present

## 2015-02-18 DIAGNOSIS — C3490 Malignant neoplasm of unspecified part of unspecified bronchus or lung: Secondary | ICD-10-CM | POA: Diagnosis not present

## 2015-02-18 DIAGNOSIS — Z5112 Encounter for antineoplastic immunotherapy: Secondary | ICD-10-CM

## 2015-02-18 DIAGNOSIS — C3411 Malignant neoplasm of upper lobe, right bronchus or lung: Secondary | ICD-10-CM

## 2015-02-18 DIAGNOSIS — C3491 Malignant neoplasm of unspecified part of right bronchus or lung: Secondary | ICD-10-CM

## 2015-02-18 DIAGNOSIS — C3430 Malignant neoplasm of lower lobe, unspecified bronchus or lung: Secondary | ICD-10-CM | POA: Diagnosis not present

## 2015-02-18 DIAGNOSIS — J0110 Acute frontal sinusitis, unspecified: Secondary | ICD-10-CM

## 2015-02-18 DIAGNOSIS — C342 Malignant neoplasm of middle lobe, bronchus or lung: Secondary | ICD-10-CM

## 2015-02-18 LAB — CBC WITH DIFFERENTIAL (CANCER CENTER ONLY)
BASO#: 0.1 10*3/uL (ref 0.0–0.2)
BASO%: 1 % (ref 0.0–2.0)
EOS%: 1.5 % (ref 0.0–7.0)
Eosinophils Absolute: 0.1 10*3/uL (ref 0.0–0.5)
HCT: 30.3 % — ABNORMAL LOW (ref 34.8–46.6)
HGB: 9.6 g/dL — ABNORMAL LOW (ref 11.6–15.9)
LYMPH#: 1.6 10*3/uL (ref 0.9–3.3)
LYMPH%: 26.7 % (ref 14.0–48.0)
MCH: 26.8 pg (ref 26.0–34.0)
MCHC: 31.7 g/dL — ABNORMAL LOW (ref 32.0–36.0)
MCV: 85 fL (ref 81–101)
MONO#: 0.7 10*3/uL (ref 0.1–0.9)
MONO%: 11.2 % (ref 0.0–13.0)
NEUT#: 3.7 10*3/uL (ref 1.5–6.5)
NEUT%: 59.6 % (ref 39.6–80.0)
PLATELETS: 359 10*3/uL (ref 145–400)
RBC: 3.58 10*6/uL — AB (ref 3.70–5.32)
RDW: 16.8 % — AB (ref 11.1–15.7)
WBC: 6.2 10*3/uL (ref 3.9–10.0)

## 2015-02-18 LAB — CMP (CANCER CENTER ONLY)
ALK PHOS: 61 U/L (ref 26–84)
ALT: 10 U/L (ref 10–47)
AST: 17 U/L (ref 11–38)
Albumin: 2.9 g/dL — ABNORMAL LOW (ref 3.3–5.5)
BUN: 8 mg/dL (ref 7–22)
CO2: 24 mEq/L (ref 18–33)
CREATININE: 0.6 mg/dL (ref 0.6–1.2)
Calcium: 8.3 mg/dL (ref 8.0–10.3)
Chloride: 101 mEq/L (ref 98–108)
GLUCOSE: 121 mg/dL — AB (ref 73–118)
POTASSIUM: 3.6 meq/L (ref 3.3–4.7)
SODIUM: 132 meq/L (ref 128–145)
Total Bilirubin: 0.6 mg/dl (ref 0.20–1.60)
Total Protein: 6.7 g/dL (ref 6.4–8.1)

## 2015-02-18 LAB — LACTATE DEHYDROGENASE: LDH: 190 U/L (ref 125–245)

## 2015-02-18 MED ORDER — DENOSUMAB 120 MG/1.7ML ~~LOC~~ SOLN
120.0000 mg | Freq: Once | SUBCUTANEOUS | Status: AC
  Administered 2015-02-18: 120 mg via SUBCUTANEOUS
  Filled 2015-02-18: qty 1.7

## 2015-02-18 MED ORDER — ATEZOLIZUMAB CHEMO INJECTION 1200 MG/20ML
1200.0000 mg | Freq: Once | INTRAVENOUS | Status: AC
  Administered 2015-02-18: 1200 mg via INTRAVENOUS
  Filled 2015-02-18: qty 20

## 2015-02-18 MED ORDER — SODIUM CHLORIDE 0.9 % IV SOLN
Freq: Once | INTRAVENOUS | Status: AC
  Administered 2015-02-18: 13:00:00 via INTRAVENOUS

## 2015-02-18 MED ORDER — SODIUM CHLORIDE 0.9 % IJ SOLN
10.0000 mL | INTRAMUSCULAR | Status: DC | PRN
  Administered 2015-02-18: 10 mL
  Filled 2015-02-18: qty 10

## 2015-02-18 MED ORDER — HEPARIN SOD (PORK) LOCK FLUSH 100 UNIT/ML IV SOLN
500.0000 [IU] | Freq: Once | INTRAVENOUS | Status: AC | PRN
  Administered 2015-02-18: 500 [IU]
  Filled 2015-02-18: qty 5

## 2015-02-18 MED ORDER — AMOXICILLIN-POT CLAVULANATE 875-125 MG PO TABS: 1.0000 | ORAL_TABLET | Freq: Two times a day (BID) | ORAL | Status: DC

## 2015-02-18 NOTE — Patient Instructions (Signed)
[facility] Discharge Instructions for Patients Receiving Chemotherapy  Today you received the following chemotherapy agents Tecentriq  To help prevent nausea and vomiting after your treatment, we encourage you to take your nausea medication   If you develop nausea and vomiting that is not controlled by your nausea medication, call the clinic.   BELOW ARE SYMPTOMS THAT SHOULD BE REPORTED IMMEDIATELY:  *FEVER GREATER THAN 100.5 F  *CHILLS WITH OR WITHOUT FEVER  NAUSEA AND VOMITING THAT IS NOT CONTROLLED WITH YOUR NAUSEA MEDICATION  *UNUSUAL SHORTNESS OF BREATH  *UNUSUAL BRUISING OR BLEEDING  TENDERNESS IN MOUTH AND THROAT WITH OR WITHOUT PRESENCE OF ULCERS  *URINARY PROBLEMS  *BOWEL PROBLEMS  UNUSUAL RASH Items with * indicate a potential emergency and should be followed up as soon as possible.  Feel free to call the clinic you have any questions or concerns. The clinic phone number is [phone].  Please show the CHEMO ALERT CARD at check-in to the Emergency Department and triage nurse.   

## 2015-02-18 NOTE — Progress Notes (Signed)
Hematology and Oncology Follow Up Visit  [name] [id number] 07-Dec-1950 65 y.o. 02/18/2015   Principle Diagnosis:  Metastatic adenocarcinoma the lung  Current Therapy:   Tecentriq s/p cycle 1  Xgeva 120 mg subcutaneous every month    Interim History:  [name] is here today with her husband for follow-up and cycle 2 of Tecentriq. Her cough is a little better but she is still coughing and having thick clear sputum at times. She completed the z-pack last week. She is still a little tender from coughing but states that the Hycodan has really helped with this. She has been able to rest at night.  Her Hgb is 8.6 today. No episodes of bleeding or bruising.   No fever, chills, n/v, rash, dizziness, palpitations, abdominal pain or changes in bowel or bladder habits. Still some mild SOB with exertion at times.  No lymphadenopathy found on exam.  No swelling, tenderness, numbness or tingling in her extremities. Her appetite has improved and she has been eating. She is staying hydrated and her weight is stable at this time.   Medications:    Medication List       This list is accurate as of: 02/18/15 11:25 AM.  Always use your most recent med list.               aspirin 81 MG tablet  Take 81 mg by mouth daily.     azithromycin 250 MG tablet  Commonly known as:  ZITHROMAX Z-PAK  Take as directed on package     calcium-vitamin D 250-125 MG-UNIT tablet  Commonly known as:  OSCAL  Take 1 tablet by mouth daily.     cefdinir 300 MG capsule  Commonly known as:  OMNICEF  Take 300 mg by mouth 2 (two) times daily.     CHERATUSSIN AC 100-10 MG/5ML syrup  Generic drug:  guaiFENesin-codeine  TAKE 5 MLS BY MOUTH EVERY 8 HOURS AS NEEDED     citalopram 20 MG tablet  Commonly known as:  CELEXA  Take 1 tablet (20 mg total) by mouth daily.     fish oil-omega-3 fatty acids 1000 MG capsule  Take 2 g by mouth daily.     folic acid 1 MG tablet  Commonly known as:  FOLVITE  Take 1  tablet (1 mg total) by mouth daily. Start 5-7 days before Alimta chemotherapy. Continue until 21 days after Alimta completed.     gabapentin 300 MG capsule  Commonly known as:  NEURONTIN  Take 1 capsule (300 mg total) by mouth 3 (three) times daily.     HYDROcodone-homatropine 5-1.5 MG/5ML syrup  Commonly known as:  HYCODAN  Take 5 mLs by mouth every 4 (four) hours as needed for cough.     HYDROmorphone 2 MG tablet  Commonly known as:  DILAUDID  Take 0.5 tablets (1 mg total) by mouth every 4 (four) hours as needed for severe pain.     KLOR-CON M20 20 MEQ tablet  Generic drug:  potassium chloride SA  TAKE 1 TABLET BY MOUTH DAILY     lactulose 10 GM/15ML solution  Commonly known as:  CHRONULAC  Take 2 tablespoons 3x a day.     lidocaine-prilocaine cream  Commonly known as:  EMLA  Apply to affected area once     LORazepam 0.5 MG tablet  Commonly known as:  ATIVAN  Take 1 tablet (0.5 mg total) by mouth every 6 (six) hours as needed (Nausea or vomiting).  megestrol 400 MG/10ML suspension  Commonly known as:  MEGACE  Take 20 mLs (800 mg total) by mouth daily.     omeprazole 40 MG capsule  Commonly known as:  PRILOSEC  Take 40 mg by mouth 2 (two) times daily.     ondansetron 8 MG tablet  Commonly known as:  ZOFRAN  Take 1 tablet (8 mg total) by mouth 2 (two) times daily. Start the day after chemo for 3 days. Then take as needed for nausea or vomiting.     oxyCODONE 5 MG immediate release tablet  Commonly known as:  Oxy IR/ROXICODONE  Take 1-2 pills, if needed, every 4 hours for pain secondary to cancer.     prochlorperazine 10 MG tablet  Commonly known as:  COMPAZINE  Take 1 tablet (10 mg total) by mouth every 6 (six) hours as needed (Nausea or vomiting).     PROVENTIL HFA 108 (90 Base) MCG/ACT inhaler  Generic drug:  albuterol  as needed.     PROAIR RESPICLICK 875 (90 Base) MCG/ACT Aepb  Generic drug:  Albuterol Sulfate  USE 2 PUFFS EVERY 4-6 HOURS      SUMAtriptan 100 MG tablet  Commonly known as:  IMITREX  Take 100 mg by mouth as needed. Reported on 02/10/2015     TAMIFLU 75 MG capsule  Generic drug:  oseltamivir  Take 75 mg by mouth 2 (two) times daily.     Vitamin D 2000 units tablet  Take 2,000 Units by mouth daily.        Allergies:  Allergies  Allergen Reactions  . Demerol Other (See Comments)    Syncope  . Mefoxin [Cefoxitin Sodium In Dextrose]     rash  . Topamax Other (See Comments)    Had trouble walking  . Avelox [Moxifloxacin Hcl In Nacl]     Hypotension.    Past Medical History, Surgical history, Social history, and Family History were reviewed and updated.  Review of Systems: All other 10 point review of systems is negative.   Physical Exam:  height is '5\' 1"'$  (1.549 m) and weight is 125 lb (56.7 kg). Her oral temperature is 98.8 F (37.1 C). Her blood pressure is 109/57 and her pulse is 102. Her respiration is 18.   Wt Readings from Last 3 Encounters:  02/18/15 125 lb (56.7 kg)  01/12/15 126 lb (57.153 kg)  12/31/14 126 lb (57.153 kg)    Ocular: Sclerae unicteric, pupils equal, round and reactive to light Ear-nose-throat: Oropharynx clear, dentition fair Lymphatic: No cervical supraclavicular or axillary adenopathy Lungs no rales or rhonchi, good excursion bilaterally Heart regular rate and rhythm, no murmur appreciated Abd soft, nontender, positive bowel sounds, no liver or spleen tip palpated on exam MSK no focal spinal tenderness, no joint edema Neuro: non-focal, well-oriented, appropriate affect Breasts: Deferred  Lab Results  Component Value Date   WBC 6.2 02/18/2015   HGB 9.6* 02/18/2015   HCT 30.3* 02/18/2015   MCV 85 02/18/2015   PLT 359 02/18/2015   Lab Results  Component Value Date   FERRITIN 47 10/29/2013   IRON 64 10/29/2013   TIBC 233* 10/29/2013   UIBC 169 10/29/2013   IRONPCTSAT 27 10/29/2013   Lab Results  Component Value Date   RETICCTPCT 1.4 08/13/2008   RBC 3.58*  02/18/2015   RETICCTABS 61.6 08/13/2008   No results found for: KPAFRELGTCHN, LAMBDASER, KAPLAMBRATIO No results found for: IGGSERUM, IGA, IGMSERUM No results found for: TOTALPROTELP, ALBUMINELP, A1GS, A2GS, BETS, [place], [place], MSPIKE,  SPEI   Chemistry      Component Value Date/Time   NA 136 02/10/2015 1202   NA 134* 01/11/2015 0944   K 4.1 02/10/2015 1202   K 3.7 01/11/2015 0944   CL 101 02/10/2015 1202   CL 100* 01/11/2015 0944   CO2 27 02/10/2015 1202   CO2 25 01/11/2015 0944   BUN 9 02/10/2015 1202   BUN 11 01/11/2015 0944   CREATININE 0.7 02/10/2015 1202   CREATININE 0.53 01/11/2015 0944      Component Value Date/Time   CALCIUM 8.7 02/10/2015 1202   CALCIUM 8.8* 01/11/2015 0944   ALKPHOS 67 02/10/2015 1202   ALKPHOS 75 01/11/2015 0944   AST 18 02/10/2015 1202   AST 18 01/11/2015 0944   ALT 12 02/10/2015 1202   ALT 10* 01/11/2015 0944   BILITOT 0.60 02/10/2015 1202   BILITOT 0.9 01/11/2015 0944     Impression and Plan: Ms. Scronce is a pleasant 65 yo white female with metastatic adenocarcinoma of the lung. She is feeling a little better but still has a productive cough despite finishing her z-pack last week.  We will start her on Augmentin 875 mg BID for 7 days. She will contact us if her symptoms worse.  We will proceed with cycle 2 of Tecentriq today as planned.  We will plan to repeat her scans in 3 weeks and then see her back for labs, follow-up and treatment in 4 weeks.  She will contact us with any other questions or concerns. We can certainly see her sooner if need be.   Eliezer Bottom, NP 1/11/201711:25 AM

## 2015-02-19 LAB — PREALBUMIN: PREALBUMIN: 8 mg/dL — AB (ref 10–36)

## 2015-02-20 ENCOUNTER — Telehealth: Payer: Self-pay | Admitting: *Deleted

## 2015-02-20 DIAGNOSIS — J440 Chronic obstructive pulmonary disease with acute lower respiratory infection: Secondary | ICD-10-CM

## 2015-02-20 DIAGNOSIS — R0600 Dyspnea, unspecified: Secondary | ICD-10-CM

## 2015-02-20 MED ORDER — IPRATROPIUM-ALBUTEROL 20-100 MCG/ACT IN AERS: 1.0000 | INHALATION_SPRAY | Freq: Four times a day (QID) | RESPIRATORY_TRACT | Status: DC | PRN

## 2015-02-20 NOTE — Telephone Encounter (Signed)
Patient c/o increased SOB today. She is using her oxygen as prescribed at 1.5L. She denies any other symptoms. Spoke to Dr Marin Olp who wants patient to begin using a combivent inhaler.

## 2015-02-25 DIAGNOSIS — J40 Bronchitis, not specified as acute or chronic: Secondary | ICD-10-CM | POA: Diagnosis not present

## 2015-03-03 ENCOUNTER — Ambulatory Visit: Payer: PPO | Admitting: Hematology & Oncology

## 2015-03-03 ENCOUNTER — Other Ambulatory Visit: Payer: PPO

## 2015-03-03 ENCOUNTER — Ambulatory Visit: Payer: PPO

## 2015-03-09 ENCOUNTER — Ambulatory Visit (HOSPITAL_COMMUNITY)
Admission: RE | Admit: 2015-03-09 | Discharge: 2015-03-09 | Disposition: A | Discharge status: Home / Self Care | Payer: PPO | Source: Ambulatory Visit | Attending: Family | Admitting: Family

## 2015-03-09 DIAGNOSIS — J90 Pleural effusion, not elsewhere classified: Secondary | ICD-10-CM | POA: Diagnosis not present

## 2015-03-09 DIAGNOSIS — C3411 Malignant neoplasm of upper lobe, right bronchus or lung: Secondary | ICD-10-CM | POA: Insufficient documentation

## 2015-03-09 DIAGNOSIS — C3490 Malignant neoplasm of unspecified part of unspecified bronchus or lung: Secondary | ICD-10-CM | POA: Diagnosis not present

## 2015-03-09 LAB — GLUCOSE, CAPILLARY: Glucose-Capillary: 81 mg/dL (ref 65–99)

## 2015-03-09 MED ORDER — FLUDEOXYGLUCOSE F - 18 (FDG) INJECTION
6.2500 | Freq: Once | INTRAVENOUS | Status: AC | PRN
  Administered 2015-03-09: 6.25 via INTRAVENOUS

## 2015-03-10 ENCOUNTER — Telehealth: Payer: Self-pay | Admitting: Family

## 2015-03-10 NOTE — Telephone Encounter (Signed)
I spoke with [name] over the phone and discussed her PET scan results. All questions were answered and she will see Dr. Marin Olp tomorrow for her scheduled follow-up and treatment.

## 2015-03-11 ENCOUNTER — Ambulatory Visit (HOSPITAL_BASED_OUTPATIENT_CLINIC_OR_DEPARTMENT_OTHER): Payer: PPO | Admitting: Hematology & Oncology

## 2015-03-11 ENCOUNTER — Encounter: Payer: Self-pay | Admitting: Hematology & Oncology

## 2015-03-11 ENCOUNTER — Other Ambulatory Visit (HOSPITAL_BASED_OUTPATIENT_CLINIC_OR_DEPARTMENT_OTHER): Payer: PPO

## 2015-03-11 ENCOUNTER — Ambulatory Visit (HOSPITAL_BASED_OUTPATIENT_CLINIC_OR_DEPARTMENT_OTHER): Payer: PPO

## 2015-03-11 VITALS — BP 128/86 | HR 94 | Temp 98.5°F | Resp 20

## 2015-03-11 VITALS — BP 128/86 | HR 94 | Temp 98.5°F | Resp 20 | Ht 61.0 in | Wt 124.0 lb

## 2015-03-11 DIAGNOSIS — Z5112 Encounter for antineoplastic immunotherapy: Secondary | ICD-10-CM | POA: Diagnosis not present

## 2015-03-11 DIAGNOSIS — C3491 Malignant neoplasm of unspecified part of right bronchus or lung: Secondary | ICD-10-CM

## 2015-03-11 DIAGNOSIS — C3411 Malignant neoplasm of upper lobe, right bronchus or lung: Secondary | ICD-10-CM

## 2015-03-11 DIAGNOSIS — C3490 Malignant neoplasm of unspecified part of unspecified bronchus or lung: Secondary | ICD-10-CM

## 2015-03-11 LAB — CMP (CANCER CENTER ONLY)
ALBUMIN: 3.1 g/dL — AB (ref 3.3–5.5)
ALT(SGPT): 15 U/L (ref 10–47)
AST: 18 U/L (ref 11–38)
Alkaline Phosphatase: 77 U/L (ref 26–84)
BUN: 17 mg/dL (ref 7–22)
CHLORIDE: 103 meq/L (ref 98–108)
CO2: 27 mEq/L (ref 18–33)
Calcium: 9.4 mg/dL (ref 8.0–10.3)
Creat: 0.6 mg/dl (ref 0.6–1.2)
Glucose, Bld: 102 mg/dL (ref 73–118)
POTASSIUM: 3.4 meq/L (ref 3.3–4.7)
Sodium: 138 mEq/L (ref 128–145)
TOTAL PROTEIN: 7.4 g/dL (ref 6.4–8.1)
Total Bilirubin: 0.6 mg/dl (ref 0.20–1.60)

## 2015-03-11 LAB — LACTATE DEHYDROGENASE: LDH: 208 U/L (ref 125–245)

## 2015-03-11 LAB — CBC WITH DIFFERENTIAL (CANCER CENTER ONLY)
BASO#: 0.1 10*3/uL (ref 0.0–0.2)
BASO%: 0.6 % (ref 0.0–2.0)
EOS ABS: 0.1 10*3/uL (ref 0.0–0.5)
EOS%: 1.3 % (ref 0.0–7.0)
HCT: 35.5 % (ref 34.8–46.6)
HGB: 11.3 g/dL — ABNORMAL LOW (ref 11.6–15.9)
LYMPH#: 1.9 10*3/uL (ref 0.9–3.3)
LYMPH%: 17.4 % (ref 14.0–48.0)
MCH: 26.8 pg (ref 26.0–34.0)
MCHC: 31.8 g/dL — AB (ref 32.0–36.0)
MCV: 84 fL (ref 81–101)
MONO#: 1 10*3/uL — AB (ref 0.1–0.9)
MONO%: 8.9 % (ref 0.0–13.0)
NEUT#: 8 10*3/uL — ABNORMAL HIGH (ref 1.5–6.5)
NEUT%: 71.8 % (ref 39.6–80.0)
PLATELETS: 281 10*3/uL (ref 145–400)
RBC: 4.21 10*6/uL (ref 3.70–5.32)
RDW: 19.2 % — ABNORMAL HIGH (ref 11.1–15.7)
WBC: 11.1 10*3/uL — AB (ref 3.9–10.0)

## 2015-03-11 IMAGING — MR MR LUMBAR SPINE WO/W CM
4 of 7 series · 20 of 48 positions shown · IV contrast (16ml Multihance)
Comparison: CT 01/20/2012.  MRI 02/13/2004.

CLINICAL DATA: Low back pain. Left leg pain. Left foot pain.
Recurrent lung cancer.

EXAM:
MRI LUMBAR SPINE WITHOUT AND WITH CONTRAST
TECHNIQUE: Multiplanar and multiecho pulse sequences of the lumbar spine were
obtained without and with intravenous contrast.
CONTRAST:  16mL MULTIHANCE GADOBENATE DIMEGLUMINE 529 MG/ML IV SOLN

[Series 3: T1 · sagittal · 4.0mm · 0.84mm/px · 5 of 14 slices shown (1 of 2)]
[im 1/14]
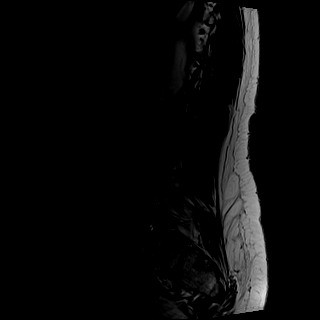
[im 4/14]
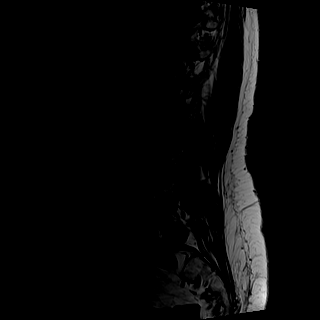
[im 7/14]
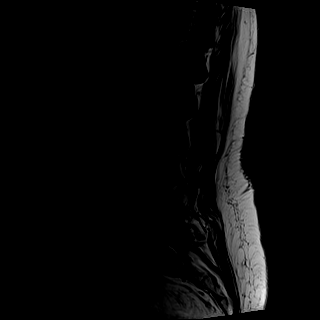
[im 10/14]
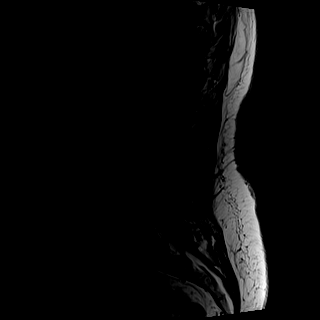
[im 14/14]
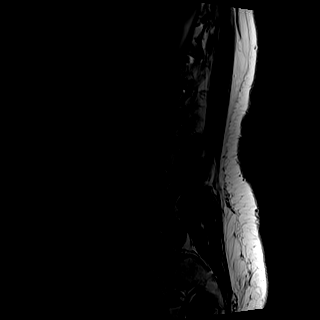

[Series 5: T2 · axial · 4.0mm · 0.37mm/px · z∈[-50,+179]mm · 8 of 33 slices shown (1 of 2)]
[im 1/33]
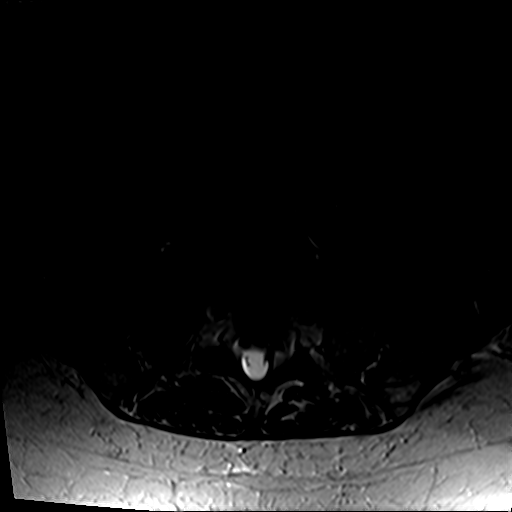
[im 4/33]
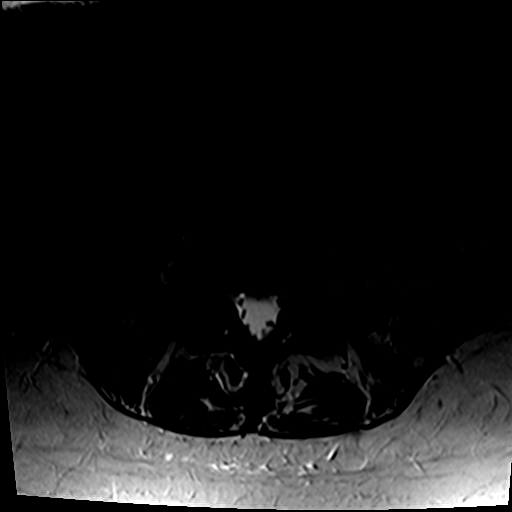
[im 11/33]
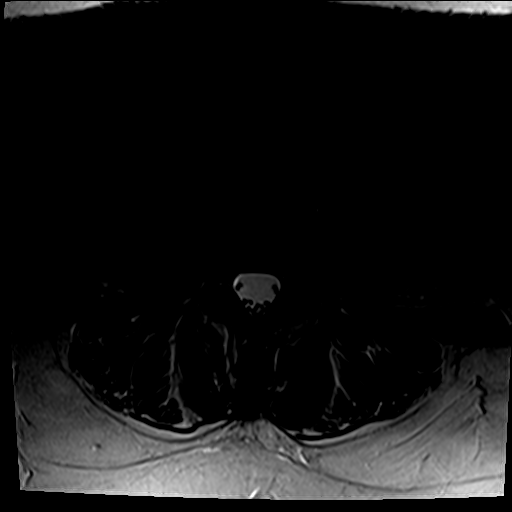
[im 15/33]
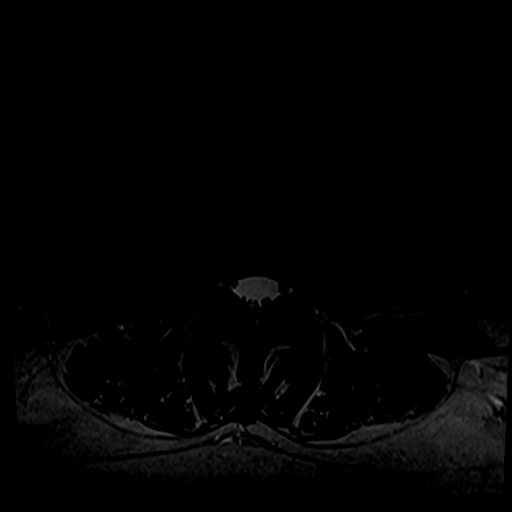
[im 18/33]
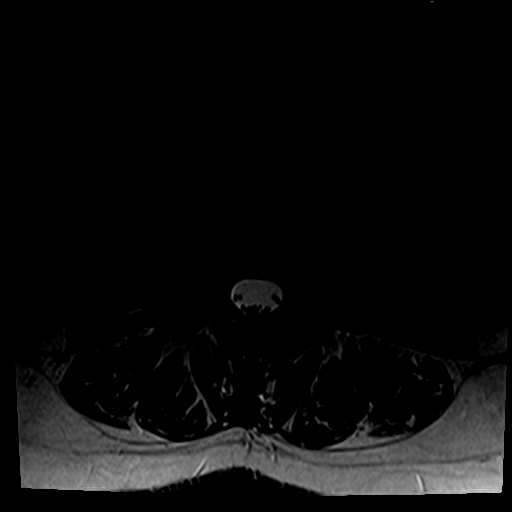
[im 22/33]
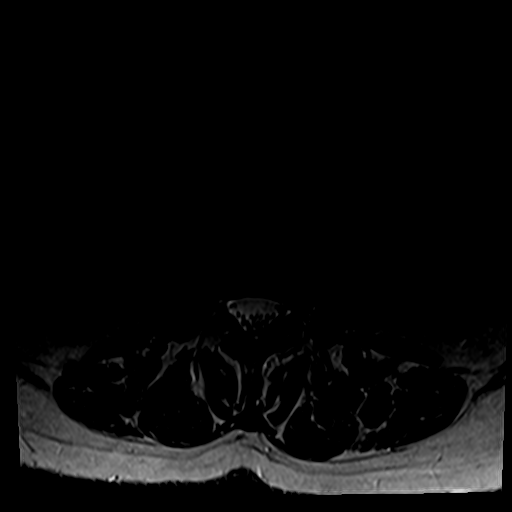
[im 29/33]
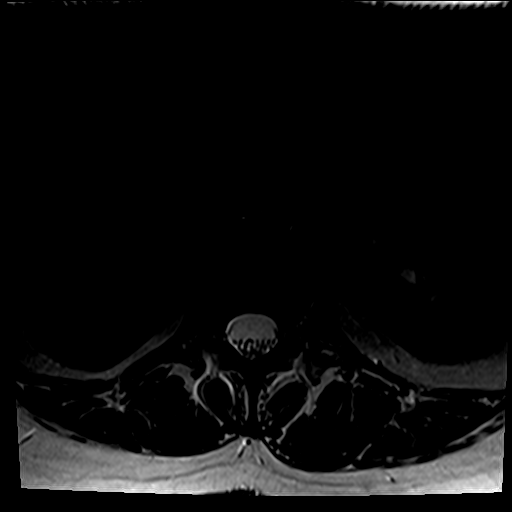
[im 33/33]
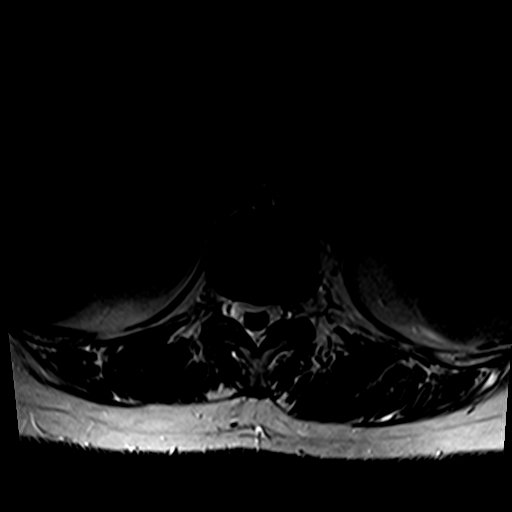

[Series 6: T1 · axial · 4.0mm · 0.37mm/px · z∈[-34,+107]mm · 3 of 33 slices shown (2 of 2)]
[im 4/33]
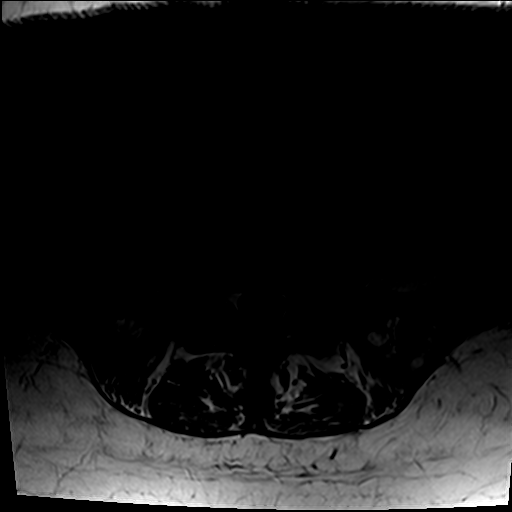
[im 18/33]
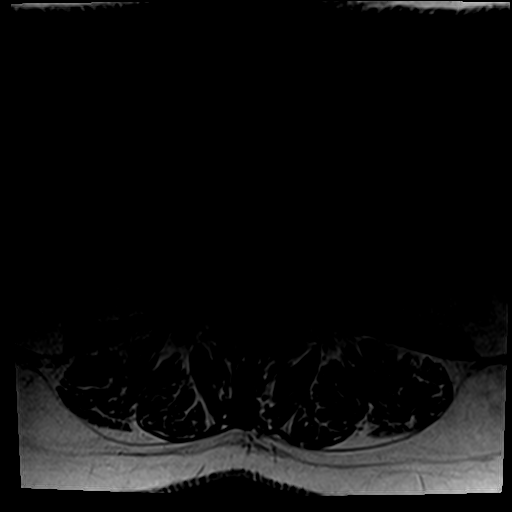
[im 29/33]
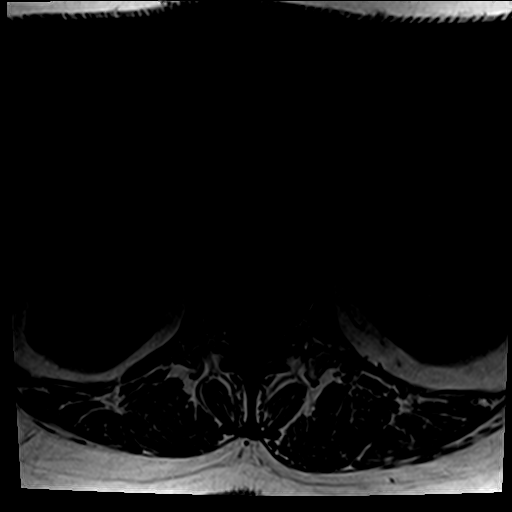

[Series 7: T2 · sagittal · 4.0mm · 0.42mm/px · 4 of 14 slices shown (2 of 2)]
[im 1/14]
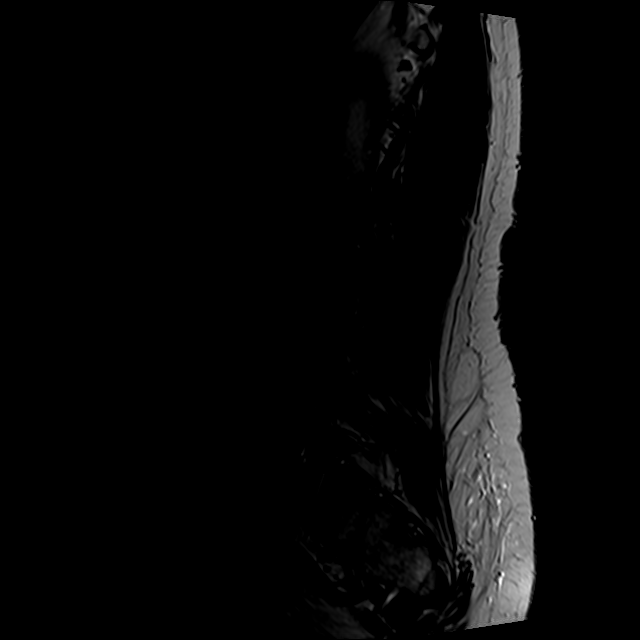
[im 5/14]
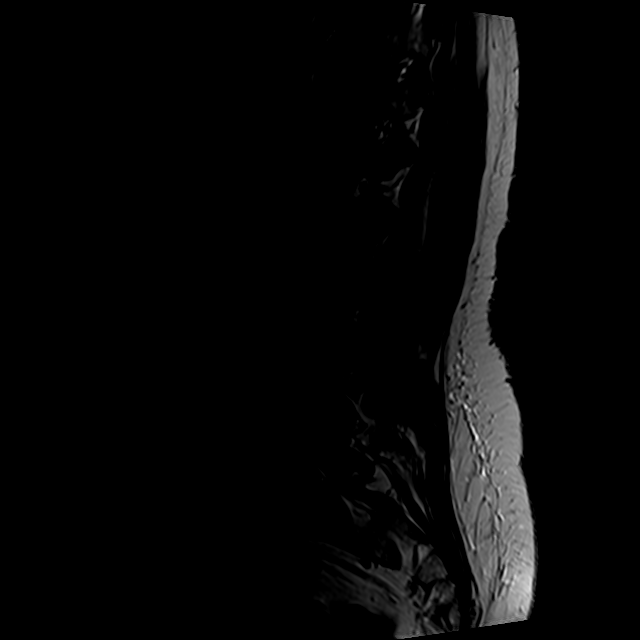
[im 9/14]
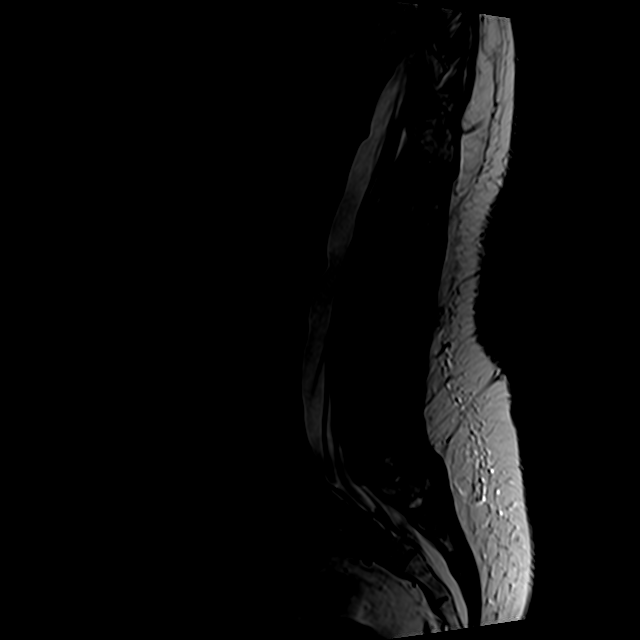
[im 14/14]
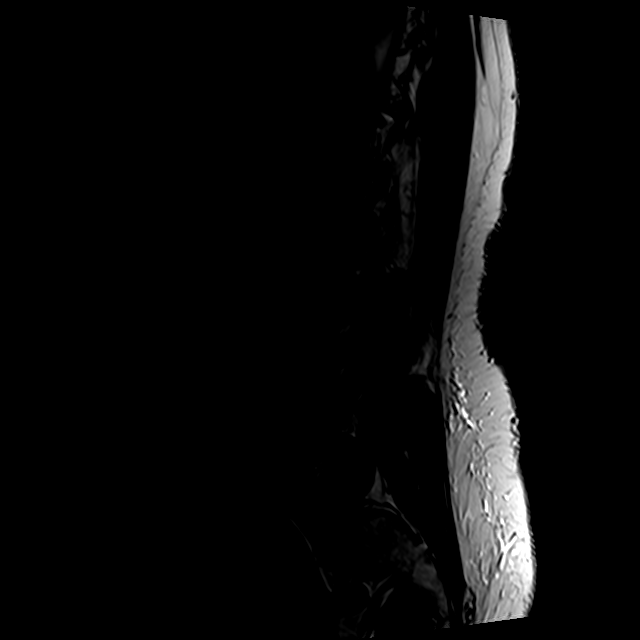

[20 of 48 positions shown; findings below may reference images not displayed]

FINDINGS: There is no evidence of osseous metastatic disease in the region. No
evidence of metastatic disease to the neural structures.

The T11-12 and T12-L1 discs show shallow protrusions with there is
no neural compression. The distal cord and conus are normal with
conus tip at L1.

L1-2:  Normal interspace.

L2-3:  Bulging of the disc.  No stenosis.

L3-4, L4-5 and L5-S1:  Normal interspace is.

No significant facet arthropathy. Paravertebral soft tissues appear
unremarkable.

Since the study of 8775, there has been little change and no
significant change.
IMPRESSION: No evidence of regional metastatic disease.

No cause of the left leg or foot symptoms is identified. No stenosis
or neural compression.

## 2015-03-11 MED ORDER — SODIUM CHLORIDE 0.9 % IV SOLN
Freq: Once | INTRAVENOUS | Status: AC
  Administered 2015-03-11: 13:00:00 via INTRAVENOUS

## 2015-03-11 MED ORDER — SODIUM CHLORIDE 0.9 % IV SOLN
1200.0000 mg | Freq: Once | INTRAVENOUS | Status: AC
  Administered 2015-03-11: 1200 mg via INTRAVENOUS
  Filled 2015-03-11: qty 20

## 2015-03-11 MED ORDER — SODIUM CHLORIDE 0.9 % IJ SOLN
10.0000 mL | INTRAMUSCULAR | Status: DC | PRN
  Administered 2015-03-11: 10 mL
  Filled 2015-03-11: qty 10

## 2015-03-11 MED ORDER — HEPARIN SOD (PORK) LOCK FLUSH 100 UNIT/ML IV SOLN
500.0000 [IU] | Freq: Once | INTRAVENOUS | Status: AC | PRN
  Administered 2015-03-11: 500 [IU]
  Filled 2015-03-11: qty 5

## 2015-03-11 NOTE — Patient Instructions (Signed)
[facility] Discharge Instructions for Patients Receiving Chemotherapy  Today you received the following chemotherapy agents Tecentriq  To help prevent nausea and vomiting after your treatment, we encourage you to take your nausea medication   If you develop nausea and vomiting that is not controlled by your nausea medication, call the clinic.   BELOW ARE SYMPTOMS THAT SHOULD BE REPORTED IMMEDIATELY:  *FEVER GREATER THAN 100.5 F  *CHILLS WITH OR WITHOUT FEVER  NAUSEA AND VOMITING THAT IS NOT CONTROLLED WITH YOUR NAUSEA MEDICATION  *UNUSUAL SHORTNESS OF BREATH  *UNUSUAL BRUISING OR BLEEDING  TENDERNESS IN MOUTH AND THROAT WITH OR WITHOUT PRESENCE OF ULCERS  *URINARY PROBLEMS  *BOWEL PROBLEMS  UNUSUAL RASH Items with * indicate a potential emergency and should be followed up as soon as possible.  Feel free to call the clinic you have any questions or concerns. The clinic phone number is [phone].  Please show the CHEMO ALERT CARD at check-in to the Emergency Department and triage nurse.   

## 2015-03-11 NOTE — Progress Notes (Signed)
Hematology and Oncology Follow Up Visit  [name] [id number] October 25, 1950 65 y.o. 03/11/2015   Principle Diagnosis:  Metastatic adenocarcinoma the lung  Current Therapy:   Tecentriq s/p cycle 2  Xgeva 120 mg subcutaneous every month    Interim History:  [name] is here today with her husband for follow-up.  She is doing okay. There is still a lot of stress with things at home.  Thankfully, her PET scan that we did showed stable disease. There is nothing new that they could be seen. Her left pleural effusion has decreased in size. I think that this is a very positive response to treatment.  Even better, is the fact that she really has not gone sick with the new chemotherapy. We are using Tecentriq, this really has very few side effects. She really has enjoyed this. He has given her a much better quality of life.  Her appetite is a little better. She's had no nausea vomiting.  She's still having some pain in the left posterior shoulder. This is a little bit better.  She has a hiatal hernia. This does cause her problems on occasion.   She's had no problems with leg swelling. She's had no rashes.   Overall, her performance status is ECOG 1-2.    Medications:    Medication List       This list is accurate as of: 03/11/15  5:51 PM.  Always use your most recent med list.               amoxicillin-clavulanate 875-125 MG tablet  Commonly known as:  AUGMENTIN  Take 1 tablet by mouth 2 (two) times daily.     aspirin 81 MG tablet  Take 81 mg by mouth daily.     azithromycin 250 MG tablet  Commonly known as:  ZITHROMAX Z-PAK  Take as directed on package     calcium-vitamin D 250-125 MG-UNIT tablet  Commonly known as:  OSCAL  Take 1 tablet by mouth daily.     cefdinir 300 MG capsule  Commonly known as:  OMNICEF  Take 300 mg by mouth 2 (two) times daily.     CHERATUSSIN AC 100-10 MG/5ML syrup  Generic drug:  guaiFENesin-codeine  TAKE 5 MLS BY MOUTH EVERY 8 HOURS AS  NEEDED     citalopram 20 MG tablet  Commonly known as:  CELEXA  Take 1 tablet (20 mg total) by mouth daily.     fish oil-omega-3 fatty acids 1000 MG capsule  Take 2 g by mouth daily.     gabapentin 300 MG capsule  Commonly known as:  NEURONTIN  Take 1 capsule (300 mg total) by mouth 3 (three) times daily.     HYDROcodone-homatropine 5-1.5 MG/5ML syrup  Commonly known as:  HYCODAN  Take 5 mLs by mouth every 4 (four) hours as needed for cough.     HYDROmorphone 2 MG tablet  Commonly known as:  DILAUDID  Take 0.5 tablets (1 mg total) by mouth every 4 (four) hours as needed for severe pain.     Ipratropium-Albuterol 20-100 MCG/ACT Aers respimat  Commonly known as:  COMBIVENT  Inhale 1 puff into the lungs every 6 (six) hours as needed for wheezing.     KLOR-CON M20 20 MEQ tablet  Generic drug:  potassium chloride SA  TAKE 1 TABLET BY MOUTH DAILY     lactulose 10 GM/15ML solution  Commonly known as:  CHRONULAC  Take 2 tablespoons 3x a day.  megestrol 400 MG/10ML suspension  Commonly known as:  MEGACE  Take 20 mLs (800 mg total) by mouth daily.     omeprazole 40 MG capsule  Commonly known as:  PRILOSEC  Take 40 mg by mouth 2 (two) times daily.     oxyCODONE 5 MG immediate release tablet  Commonly known as:  Oxy IR/ROXICODONE  Take 1-2 pills, if needed, every 4 hours for pain secondary to cancer.     PROVENTIL HFA 108 (90 Base) MCG/ACT inhaler  Generic drug:  albuterol  as needed.     PROAIR RESPICLICK 161 (90 Base) MCG/ACT Aepb  Generic drug:  Albuterol Sulfate  USE 2 PUFFS EVERY 4-6 HOURS     SUMAtriptan 100 MG tablet  Commonly known as:  IMITREX  Take 100 mg by mouth as needed. Reported on 02/10/2015     TAMIFLU 75 MG capsule  Generic drug:  oseltamivir  Take 75 mg by mouth 2 (two) times daily.     Vitamin D 2000 units tablet  Take 2,000 Units by mouth daily.        Allergies:  Allergies  Allergen Reactions  . Demerol Other (See Comments)    Syncope    . Mefoxin [Cefoxitin Sodium In Dextrose]     rash  . Topamax Other (See Comments)    Had trouble walking  . Avelox [Moxifloxacin Hcl In Nacl]     Hypotension.    Past Medical History, Surgical history, Social history, and Family History were reviewed and updated.  Review of Systems: All other 10 point review of systems is negative.   Physical Exam:  height is '5\' 1"'$  (1.549 m) and weight is 124 lb (56.246 kg). Her oral temperature is 98.5 F (36.9 C). Her blood pressure is 128/86 and her pulse is 94. Her respiration is 20.   Wt Readings from Last 3 Encounters:  03/11/15 124 lb (56.246 kg)  02/18/15 125 lb (56.7 kg)  01/12/15 126 lb (57.153 kg)    Well-developed and well-nourished white female in no obvious distress. Head and neck exam shows no ocular or oral lesions. There are no palpable cervical or supraclavicular lymph nodes. Lungs are clear on the right side. There is still some slight decrease breath sounds on the left side. Cardiac exam regular rate and rhythm with no murmurs, rubs or bruits. Abdomen is soft. She is good bowel sounds. There is no fluid wave. There is no palpable liver or spleen tip. Back exam shows no tenderness over the spine, ribs or hips. Extremities shows no clubbing, cyanosis or edema. Skin exam shows no rashes, ecchymoses or petechia. Neurological exam shows no focal neurological deficits.   Lab Results  Component Value Date   WBC 11.1* 03/11/2015   HGB 11.3* 03/11/2015   HCT 35.5 03/11/2015   MCV 84 03/11/2015   PLT 281 03/11/2015   Lab Results  Component Value Date   FERRITIN 47 10/29/2013   IRON 64 10/29/2013   TIBC 233* 10/29/2013   UIBC 169 10/29/2013   IRONPCTSAT 27 10/29/2013   Lab Results  Component Value Date   RETICCTPCT 1.4 08/13/2008   RBC 4.21 03/11/2015   RETICCTABS 61.6 08/13/2008   No results found for: KPAFRELGTCHN, LAMBDASER, KAPLAMBRATIO No results found for: IGGSERUM, IGA, IGMSERUM No results found for: Ronnald Ramp, A1GS, A2GS, Violet Baldy, MSPIKE, SPEI   Chemistry      Component Value Date/Time   NA 138 03/11/2015 1027   NA 134* 01/11/2015 0944   K  3.4 03/11/2015 1027   K 3.7 01/11/2015 0944   CL 103 03/11/2015 1027   CL 100* 01/11/2015 0944   CO2 27 03/11/2015 1027   CO2 25 01/11/2015 0944   BUN 17 03/11/2015 1027   BUN 11 01/11/2015 0944   CREATININE 0.6 03/11/2015 1027   CREATININE 0.53 01/11/2015 0944      Component Value Date/Time   CALCIUM 9.4 03/11/2015 1027   CALCIUM 8.8* 01/11/2015 0944   ALKPHOS 77 03/11/2015 1027   ALKPHOS 75 01/11/2015 0944   AST 18 03/11/2015 1027   AST 18 01/11/2015 0944   ALT 15 03/11/2015 1027   ALT 10* 01/11/2015 0944   BILITOT 0.60 03/11/2015 1027   BILITOT 0.9 01/11/2015 0944     Impression and Plan: Ms. Bechtel is a pleasant 65 yo white female with metastatic adenocarcinoma of the lung. She has no mutations that we can target.  We tried her on systemic chemotherapy which she tolerated very poorly.  She is doing quite well with Tecentriq and I'm glad for this. Her quality of life is better.   I will give 3 more cycles and then we will reevaluate with another PET scan.   It is important to remember that her granddaughter is having surgery in early April. She really wants to be there to help out with the family. I'm sure that we will be able to accommodate and change her schedule so that she will be able to be with her family during this tough time.   I'm just glad that she is feeling somewhat better.  We will see her back in 3 weeks.   I spent about 25-30 minutes with them today.  Volanda Napoleon, MD 2/1/20175:51 PM

## 2015-03-12 LAB — PREALBUMIN: PREALBUMIN: 25 mg/dL (ref 10–36)

## 2015-04-01 ENCOUNTER — Encounter: Payer: Self-pay | Admitting: Hematology & Oncology

## 2015-04-01 ENCOUNTER — Other Ambulatory Visit: Payer: Self-pay | Admitting: Family

## 2015-04-01 ENCOUNTER — Ambulatory Visit (HOSPITAL_BASED_OUTPATIENT_CLINIC_OR_DEPARTMENT_OTHER): Payer: PPO

## 2015-04-01 ENCOUNTER — Other Ambulatory Visit (HOSPITAL_BASED_OUTPATIENT_CLINIC_OR_DEPARTMENT_OTHER): Payer: PPO

## 2015-04-01 ENCOUNTER — Ambulatory Visit (HOSPITAL_BASED_OUTPATIENT_CLINIC_OR_DEPARTMENT_OTHER): Payer: PPO | Admitting: Hematology & Oncology

## 2015-04-01 VITALS — BP 140/75 | HR 101 | Temp 98.3°F | Resp 20 | Wt 123.0 lb

## 2015-04-01 DIAGNOSIS — C7951 Secondary malignant neoplasm of bone: Secondary | ICD-10-CM

## 2015-04-01 DIAGNOSIS — C3411 Malignant neoplasm of upper lobe, right bronchus or lung: Secondary | ICD-10-CM

## 2015-04-01 DIAGNOSIS — C3490 Malignant neoplasm of unspecified part of unspecified bronchus or lung: Secondary | ICD-10-CM

## 2015-04-01 DIAGNOSIS — C3491 Malignant neoplasm of unspecified part of right bronchus or lung: Secondary | ICD-10-CM

## 2015-04-01 DIAGNOSIS — C342 Malignant neoplasm of middle lobe, bronchus or lung: Secondary | ICD-10-CM

## 2015-04-01 DIAGNOSIS — C3430 Malignant neoplasm of lower lobe, unspecified bronchus or lung: Secondary | ICD-10-CM | POA: Diagnosis not present

## 2015-04-01 DIAGNOSIS — Z5112 Encounter for antineoplastic immunotherapy: Secondary | ICD-10-CM

## 2015-04-01 DIAGNOSIS — R05 Cough: Secondary | ICD-10-CM

## 2015-04-01 DIAGNOSIS — J910 Malignant pleural effusion: Secondary | ICD-10-CM

## 2015-04-01 DIAGNOSIS — R059 Cough, unspecified: Secondary | ICD-10-CM

## 2015-04-01 DIAGNOSIS — J0110 Acute frontal sinusitis, unspecified: Secondary | ICD-10-CM

## 2015-04-01 HISTORY — DX: Malignant pleural effusion: J91.0

## 2015-04-01 HISTORY — DX: Malignant neoplasm of unspecified part of unspecified bronchus or lung: C34.90

## 2015-04-01 LAB — CBC WITH DIFFERENTIAL (CANCER CENTER ONLY)
BASO#: 0.1 10*3/uL (ref 0.0–0.2)
BASO%: 1 % (ref 0.0–2.0)
EOS%: 2.4 % (ref 0.0–7.0)
Eosinophils Absolute: 0.2 10*3/uL (ref 0.0–0.5)
HEMATOCRIT: 36.9 % (ref 34.8–46.6)
HEMOGLOBIN: 11.7 g/dL (ref 11.6–15.9)
LYMPH#: 1.6 10*3/uL (ref 0.9–3.3)
LYMPH%: 19.5 % (ref 14.0–48.0)
MCH: 27 pg (ref 26.0–34.0)
MCHC: 31.7 g/dL — AB (ref 32.0–36.0)
MCV: 85 fL (ref 81–101)
MONO#: 0.7 10*3/uL (ref 0.1–0.9)
MONO%: 8.1 % (ref 0.0–13.0)
NEUT%: 69 % (ref 39.6–80.0)
NEUTROS ABS: 5.7 10*3/uL (ref 1.5–6.5)
Platelets: 277 10*3/uL (ref 145–400)
RBC: 4.34 10*6/uL (ref 3.70–5.32)
RDW: 18.2 % — ABNORMAL HIGH (ref 11.1–15.7)
WBC: 8.3 10*3/uL (ref 3.9–10.0)

## 2015-04-01 LAB — CMP (CANCER CENTER ONLY)
ALBUMIN: 3.6 g/dL (ref 3.3–5.5)
ALT: 14 U/L (ref 10–47)
AST: 22 U/L (ref 11–38)
Alkaline Phosphatase: 117 U/L — ABNORMAL HIGH (ref 26–84)
BUN, Bld: 12 mg/dL (ref 7–22)
CALCIUM: 8.9 mg/dL (ref 8.0–10.3)
CO2: 25 mEq/L (ref 18–33)
CREATININE: 0.8 mg/dL (ref 0.6–1.2)
Chloride: 104 mEq/L (ref 98–108)
Glucose, Bld: 134 mg/dL — ABNORMAL HIGH (ref 73–118)
Potassium: 3.4 mEq/L (ref 3.3–4.7)
SODIUM: 137 meq/L (ref 128–145)
TOTAL PROTEIN: 7.4 g/dL (ref 6.4–8.1)
Total Bilirubin: 0.5 mg/dl (ref 0.20–1.60)

## 2015-04-01 LAB — LACTATE DEHYDROGENASE: LDH: 274 U/L — AB (ref 125–245)

## 2015-04-01 MED ORDER — SODIUM CHLORIDE 0.9 % IV SOLN
Freq: Once | INTRAVENOUS | Status: AC
  Administered 2015-04-01: 10:00:00 via INTRAVENOUS

## 2015-04-01 MED ORDER — SODIUM CHLORIDE 0.9 % IV SOLN
1200.0000 mg | Freq: Once | INTRAVENOUS | Status: AC
  Administered 2015-04-01: 1200 mg via INTRAVENOUS
  Filled 2015-04-01: qty 20

## 2015-04-01 MED ORDER — DENOSUMAB 120 MG/1.7ML ~~LOC~~ SOLN
120.0000 mg | Freq: Once | SUBCUTANEOUS | Status: AC
  Administered 2015-04-01: 120 mg via SUBCUTANEOUS
  Filled 2015-04-01: qty 1.7

## 2015-04-01 MED ORDER — HEPARIN SOD (PORK) LOCK FLUSH 100 UNIT/ML IV SOLN
500.0000 [IU] | Freq: Once | INTRAVENOUS | Status: AC | PRN
  Administered 2015-04-01: 500 [IU]
  Filled 2015-04-01: qty 5

## 2015-04-01 MED ORDER — HYDROCODONE-HOMATROPINE 5-1.5 MG/5ML PO SYRP: 5.0000 mL | ORAL_SOLUTION | ORAL | Status: DC | PRN

## 2015-04-01 MED ORDER — SODIUM CHLORIDE 0.9 % IJ SOLN
10.0000 mL | INTRAMUSCULAR | Status: DC | PRN
  Administered 2015-04-01: 10 mL
  Filled 2015-04-01: qty 10

## 2015-04-01 NOTE — Patient Instructions (Signed)
[facility] Discharge Instructions for Patients Receiving Chemotherapy  Today you received the following chemotherapy agents Tecentriq and Xgeva.  To help prevent nausea and vomiting after your treatment, we encourage you to take your nausea medication.   If you develop nausea and vomiting that is not controlled by your nausea medication, call the clinic.   BELOW ARE SYMPTOMS THAT SHOULD BE REPORTED IMMEDIATELY:  *FEVER GREATER THAN 100.5 F  *CHILLS WITH OR WITHOUT FEVER  NAUSEA AND VOMITING THAT IS NOT CONTROLLED WITH YOUR NAUSEA MEDICATION  *UNUSUAL SHORTNESS OF BREATH  *UNUSUAL BRUISING OR BLEEDING  TENDERNESS IN MOUTH AND THROAT WITH OR WITHOUT PRESENCE OF ULCERS  *URINARY PROBLEMS  *BOWEL PROBLEMS  UNUSUAL RASH Items with * indicate a potential emergency and should be followed up as soon as possible.  Feel free to call the clinic you have any questions or concerns. The clinic phone number is [phone].  Please show the [facility] at check-in to the Emergency Department and triage nurse.

## 2015-04-01 NOTE — Progress Notes (Signed)
Hematology and Oncology Follow Up Visit  [name] [id number] 30-Dec-1950 65 y.o. 04/01/2015   Principle Diagnosis:  Metastatic adenocarcinoma the lung - bone metastases, lymph node metastasis and intrapulmonary metastasis Malignant left pleural effusion  Current Therapy:   Tecentriq s/p cycle 3 Xgeva 120 mg subcutaneous every month    Interim History:  Ms. [name] is here today with her husband for follow-up.  She looks so much better. The Tecentriq has really helped her out. Hopefully, it is working. It is definitely not causing side effects for her. She is able to help her family. She is under a lot of stress right now with her family. There are a lot of issues that are intermingling.  She still has some left shoulder pain. There's some slight midthoracic pain.  She's had no bleeding. She's had no nausea or vomiting. She is had no change in bowel or bladder habits.   There's not been any leg swelling. She's had no rashes.   She has had little cough. This is nonproductive.   She recently had a sinus infection. Both she and her husband had the same problem.   Overall, her performance status is ECOG 1.    Medications:    Medication List       This list is accurate as of: 04/01/15 12:29 PM.  Always use your most recent med list.               aspirin 81 MG tablet  Take 81 mg by mouth daily.     calcium-vitamin D 250-125 MG-UNIT tablet  Commonly known as:  OSCAL  Take 1 tablet by mouth daily.     citalopram 20 MG tablet  Commonly known as:  CELEXA  Take 1 tablet (20 mg total) by mouth daily.     ECHINACEA C COMPLETE PO  Take by mouth.     fish oil-omega-3 fatty acids 1000 MG capsule  Take 2 g by mouth daily.     gabapentin 300 MG capsule  Commonly known as:  NEURONTIN  Take 1 capsule (300 mg total) by mouth 3 (three) times daily.     HYDROcodone-homatropine 5-1.5 MG/5ML syrup  Commonly known as:  HYCODAN  Take 5 mLs by mouth every 4 (four) hours as  needed for cough.     HYDROmorphone 2 MG tablet  Commonly known as:  DILAUDID  Take 0.5 tablets (1 mg total) by mouth every 4 (four) hours as needed for severe pain.     Ipratropium-Albuterol 20-100 MCG/ACT Aers respimat  Commonly known as:  COMBIVENT  Inhale 1 puff into the lungs every 6 (six) hours as needed for wheezing.     KLOR-CON M20 20 MEQ tablet  Generic drug:  potassium chloride SA  TAKE 1 TABLET BY MOUTH DAILY     lactulose 10 GM/15ML solution  Commonly known as:  CHRONULAC  Take 2 tablespoons 3x a day.     omeprazole 40 MG capsule  Commonly known as:  PRILOSEC  Take 40 mg by mouth 2 (two) times daily.     oxyCODONE 5 MG immediate release tablet  Commonly known as:  Oxy IR/ROXICODONE  Take 1-2 pills, if needed, every 4 hours for pain secondary to cancer.     prochlorperazine 10 MG tablet  Commonly known as:  COMPAZINE  10 mg.     PROVENTIL HFA 108 (90 Base) MCG/ACT inhaler  Generic drug:  albuterol  as needed.     PROAIR RESPICLICK 053 (90 Base) MCG/ACT  Aepb  Generic drug:  Albuterol Sulfate  USE 2 PUFFS EVERY 4-6 HOURS     SUMAtriptan 100 MG tablet  Commonly known as:  IMITREX  Take 100 mg by mouth as needed. Reported on 04/01/2015     Vitamin D 2000 units tablet  Take 2,000 Units by mouth daily.        Allergies:  Allergies  Allergen Reactions  . Demerol Other (See Comments)    Syncope  . Mefoxin [Cefoxitin Sodium In Dextrose]     rash  . Topamax Other (See Comments)    Had trouble walking  . Avelox [Moxifloxacin Hcl In Nacl]     Hypotension.    Past Medical History, Surgical history, Social history, and Family History were reviewed and updated.  Review of Systems: All other 10 point review of systems is negative.   Physical Exam:  vitals were not taken for this visit.  Wt Readings from Last 3 Encounters:  04/01/15 123 lb (55.792 kg)  03/11/15 124 lb (56.246 kg)  02/18/15 125 lb (56.7 kg)    Well-developed and well-nourished white  female in no obvious distress. Head and neck exam shows no ocular or oral lesions. There are no palpable cervical or supraclavicular lymph nodes. Lungs are clear on the right side. There is still some slight decrease breath sounds on the left side. Cardiac exam regular rate and rhythm with no murmurs, rubs or bruits. Abdomen is soft. She is good bowel sounds. There is no fluid wave. There is no palpable liver or spleen tip. Back exam shows no tenderness over the spine, ribs or hips. Extremities shows no clubbing, cyanosis or edema. Skin exam shows no rashes, ecchymoses or petechia. Neurological exam shows no focal neurological deficits.   Lab Results  Component Value Date   WBC 8.3 04/01/2015   HGB 11.7 04/01/2015   HCT 36.9 04/01/2015   MCV 85 04/01/2015   PLT 277 04/01/2015   Lab Results  Component Value Date   FERRITIN 47 10/29/2013   IRON 64 10/29/2013   TIBC 233* 10/29/2013   UIBC 169 10/29/2013   IRONPCTSAT 27 10/29/2013   Lab Results  Component Value Date   RETICCTPCT 1.4 08/13/2008   RBC 4.34 04/01/2015   RETICCTABS 61.6 08/13/2008   No results found for: KPAFRELGTCHN, LAMBDASER, KAPLAMBRATIO No results found for: IGGSERUM, IGA, IGMSERUM No results found for: Odetta Pink, SPEI   Chemistry      Component Value Date/Time   NA 137 04/01/2015 1024   NA 134* 01/11/2015 0944   K 3.4 04/01/2015 1024   K 3.7 01/11/2015 0944   CL 104 04/01/2015 1024   CL 100* 01/11/2015 0944   CO2 25 04/01/2015 1024   CO2 25 01/11/2015 0944   BUN 12 04/01/2015 1024   BUN 11 01/11/2015 0944   CREATININE 0.8 04/01/2015 1024   CREATININE 0.53 01/11/2015 0944      Component Value Date/Time   CALCIUM 8.9 04/01/2015 1024   CALCIUM 8.8* 01/11/2015 0944   ALKPHOS 117* 04/01/2015 1024   ALKPHOS 75 01/11/2015 0944   AST 22 04/01/2015 1024   AST 18 01/11/2015 0944   ALT 14 04/01/2015 1024   ALT 10* 01/11/2015 0944   BILITOT 0.50 04/01/2015  1024   BILITOT 0.9 01/11/2015 0944     Impression and Plan: Ms. [name] is a pleasant 65 yo white female with metastatic adenocarcinoma of the lung. She has no mutations that we can target.  We  tried her on systemic chemotherapy which she tolerated very poorly.  She is doing quite well with Tecentriq and I'm glad for this. Her quality of life is better.   I will give 3 more cycles and then we will reevaluate with another PET scan.   It is important to remember that her granddaughter is having surgery in early April. She really wants to be there to help out with the family. I'm sure that we will be able to accommodate and change her schedule so that she will be able to be with her family during this tough time.   I'm just glad that she is feeling somewhat better.  We will see her back in 3 weeks.   I spent about 25-30 minutes with them today.  Volanda Napoleon, MD 2/22/201712:29 PM

## 2015-04-02 LAB — PREALBUMIN: PREALBUMIN: 19 mg/dL (ref 10–36)

## 2015-04-04 ENCOUNTER — Emergency Department (HOSPITAL_COMMUNITY): Payer: PPO

## 2015-04-04 ENCOUNTER — Other Ambulatory Visit: Payer: Self-pay

## 2015-04-04 ENCOUNTER — Encounter (HOSPITAL_COMMUNITY): Payer: Self-pay | Admitting: Emergency Medicine

## 2015-04-04 ENCOUNTER — Emergency Department (HOSPITAL_COMMUNITY)
Admission: EM | Admit: 2015-04-04 | Discharge: 2015-04-04 | Disposition: A | Discharge status: Home / Self Care | Payer: PPO | Attending: Emergency Medicine | Admitting: Emergency Medicine

## 2015-04-04 DIAGNOSIS — R64 Cachexia: Secondary | ICD-10-CM

## 2015-04-04 DIAGNOSIS — Z8709 Personal history of other diseases of the respiratory system: Secondary | ICD-10-CM | POA: Insufficient documentation

## 2015-04-04 DIAGNOSIS — R05 Cough: Secondary | ICD-10-CM

## 2015-04-04 DIAGNOSIS — M899 Disorder of bone, unspecified: Secondary | ICD-10-CM | POA: Diagnosis not present

## 2015-04-04 DIAGNOSIS — K219 Gastro-esophageal reflux disease without esophagitis: Secondary | ICD-10-CM | POA: Diagnosis not present

## 2015-04-04 DIAGNOSIS — Z85118 Personal history of other malignant neoplasm of bronchus and lung: Secondary | ICD-10-CM | POA: Diagnosis not present

## 2015-04-04 DIAGNOSIS — R053 Chronic cough: Secondary | ICD-10-CM

## 2015-04-04 DIAGNOSIS — C7951 Secondary malignant neoplasm of bone: Secondary | ICD-10-CM | POA: Diagnosis not present

## 2015-04-04 DIAGNOSIS — R0789 Other chest pain: Secondary | ICD-10-CM | POA: Diagnosis not present

## 2015-04-04 DIAGNOSIS — Z79899 Other long term (current) drug therapy: Secondary | ICD-10-CM | POA: Insufficient documentation

## 2015-04-04 DIAGNOSIS — C3490 Malignant neoplasm of unspecified part of unspecified bronchus or lung: Secondary | ICD-10-CM | POA: Diagnosis not present

## 2015-04-04 DIAGNOSIS — R0682 Tachypnea, not elsewhere classified: Secondary | ICD-10-CM | POA: Diagnosis not present

## 2015-04-04 DIAGNOSIS — Z87891 Personal history of nicotine dependence: Secondary | ICD-10-CM | POA: Insufficient documentation

## 2015-04-04 DIAGNOSIS — E559 Vitamin D deficiency, unspecified: Secondary | ICD-10-CM | POA: Insufficient documentation

## 2015-04-04 DIAGNOSIS — R000 Tachycardia, unspecified: Secondary | ICD-10-CM | POA: Diagnosis not present

## 2015-04-04 DIAGNOSIS — I10 Essential (primary) hypertension: Secondary | ICD-10-CM | POA: Diagnosis not present

## 2015-04-04 DIAGNOSIS — Z7982 Long term (current) use of aspirin: Secondary | ICD-10-CM | POA: Insufficient documentation

## 2015-04-04 DIAGNOSIS — C3430 Malignant neoplasm of lower lobe, unspecified bronchus or lung: Secondary | ICD-10-CM

## 2015-04-04 DIAGNOSIS — R079 Chest pain, unspecified: Secondary | ICD-10-CM | POA: Insufficient documentation

## 2015-04-04 DIAGNOSIS — Z8673 Personal history of transient ischemic attack (TIA), and cerebral infarction without residual deficits: Secondary | ICD-10-CM | POA: Insufficient documentation

## 2015-04-04 DIAGNOSIS — M898X9 Other specified disorders of bone, unspecified site: Secondary | ICD-10-CM

## 2015-04-04 LAB — CBC
HEMATOCRIT: 37.5 % (ref 36.0–46.0)
HEMOGLOBIN: 12 g/dL (ref 12.0–15.0)
MCH: 27.1 pg (ref 26.0–34.0)
MCHC: 32 g/dL (ref 30.0–36.0)
MCV: 84.8 fL (ref 78.0–100.0)
Platelets: 280 10*3/uL (ref 150–400)
RBC: 4.42 MIL/uL (ref 3.87–5.11)
RDW: 17.8 % — ABNORMAL HIGH (ref 11.5–15.5)
WBC: 8.7 10*3/uL (ref 4.0–10.5)

## 2015-04-04 LAB — BASIC METABOLIC PANEL
ANION GAP: 10 (ref 5–15)
BUN: 11 mg/dL (ref 6–20)
CALCIUM: 9 mg/dL (ref 8.9–10.3)
CHLORIDE: 105 mmol/L (ref 101–111)
CO2: 25 mmol/L (ref 22–32)
Creatinine, Ser: 0.5 mg/dL (ref 0.44–1.00)
GLUCOSE: 109 mg/dL — AB (ref 65–99)
Potassium: 4.2 mmol/L (ref 3.5–5.1)
SODIUM: 140 mmol/L (ref 135–145)

## 2015-04-04 LAB — I-STAT TROPONIN, ED: Troponin i, poc: 0.05 ng/mL (ref 0.00–0.08)

## 2015-04-04 LAB — TROPONIN I

## 2015-04-04 LAB — PROTIME-INR
INR: 1.07 (ref 0.00–1.49)
PROTHROMBIN TIME: 13.7 s (ref 11.6–15.2)

## 2015-04-04 MED ORDER — DEXAMETHASONE SODIUM PHOSPHATE 10 MG/ML IJ SOLN
10.0000 mg | Freq: Once | INTRAMUSCULAR | Status: AC
  Administered 2015-04-04: 10 mg via INTRAVENOUS
  Filled 2015-04-04: qty 1

## 2015-04-04 MED ORDER — FENTANYL CITRATE (PF) 100 MCG/2ML IJ SOLN
50.0000 ug | Freq: Once | INTRAMUSCULAR | Status: AC
  Administered 2015-04-04: 50 ug via INTRAVENOUS
  Filled 2015-04-04: qty 2

## 2015-04-04 MED ORDER — HYDROMORPHONE HCL 1 MG/ML IJ SOLN
1.0000 mg | Freq: Once | INTRAMUSCULAR | Status: DC
  Filled 2015-04-04: qty 1

## 2015-04-04 MED ORDER — SODIUM CHLORIDE 0.9 % IV BOLUS (SEPSIS)
1000.0000 mL | Freq: Once | INTRAVENOUS | Status: AC
  Administered 2015-04-04: 1000 mL via INTRAVENOUS

## 2015-04-04 MED ORDER — OXYCODONE HCL 5 MG PO TABS: ORAL_TABLET | ORAL | Status: DC

## 2015-04-04 MED ORDER — OXYCODONE HCL 5 MG PO TABS
10.0000 mg | ORAL_TABLET | Freq: Once | ORAL | Status: AC
  Administered 2015-04-04: 10 mg via ORAL
  Filled 2015-04-04: qty 2

## 2015-04-04 MED ORDER — IOHEXOL 350 MG/ML SOLN
100.0000 mL | Freq: Once | INTRAVENOUS | Status: AC | PRN
  Administered 2015-04-04: 100 mL via INTRAVENOUS

## 2015-04-04 MED ORDER — PROMETHAZINE HCL 25 MG/ML IJ SOLN
12.5000 mg | Freq: Once | INTRAMUSCULAR | Status: AC
  Administered 2015-04-04: 12.5 mg via INTRAVENOUS
  Filled 2015-04-04: qty 1

## 2015-04-04 MED ORDER — KETOROLAC TROMETHAMINE 30 MG/ML IJ SOLN
15.0000 mg | Freq: Once | INTRAMUSCULAR | Status: AC
  Administered 2015-04-04: 15 mg via INTRAVENOUS
  Filled 2015-04-04: qty 1

## 2015-04-04 MED ORDER — DIPHENHYDRAMINE HCL 50 MG/ML IJ SOLN
25.0000 mg | Freq: Once | INTRAMUSCULAR | Status: AC
  Administered 2015-04-04: 25 mg via INTRAVENOUS
  Filled 2015-04-04: qty 1

## 2015-04-04 MED ORDER — HYDROMORPHONE HCL 1 MG/ML IJ SOLN
1.0000 mg | Freq: Once | INTRAMUSCULAR | Status: AC
  Administered 2015-04-04: 1 mg via INTRAVENOUS
  Filled 2015-04-04: qty 1

## 2015-04-04 NOTE — ED Notes (Signed)
Pt requesting pain medication.  

## 2015-04-04 NOTE — ED Provider Notes (Signed)
CSN: [id number]     Arrival date & time 04/04/15  1512 History   First MD Initiated Contact with Patient 04/04/15 1536     Chief Complaint  Patient presents with  . Chest Pain     (Consider location/radiation/quality/duration/timing/severity/associated sxs/prior Treatment) Patient is a 65 y.o. female presenting with chest pain.  Chest Pain Pain location:  L chest, substernal area and L lateral chest Pain quality: aching and sharp   Pain radiates to:  Does not radiate Pain radiates to the back: no   Pain severity:  Mild Onset quality:  Gradual Duration:  12 hours Timing:  Constant Progression:  Worsening Chronicity:  Recurrent Context: breathing and movement   Relieved by:  None tried Worsened by:  Nothing tried Associated symptoms: no abdominal pain, no altered mental status, no dizziness, no fatigue and no weakness     Past Medical History  Diagnosis Date  . Cancer [facility]) 2005 & 2008    Right Upper Lobe (2005) & Left Upper Lobe (2008). Subsequent Nodules treated empirically in 2010 & 2011 w/ XRT & chemo.  Marland Kitchen Hypertension   . Stroke ([place])   . GERD (gastroesophageal reflux disease)   . B12 deficiency   . Vitamin D deficiency   . Lung cancer, primary, with metastasis from lung to other site [facility]) 04/01/2015  . Pleural effusion, malignant 04/01/2015   Past Surgical History  Procedure Laterality Date  . Vaginal hysterectomy  1983  . Cyst removed from ovary  1968  . Lung removal, partial  2005, 2008    R upper lobe and left upper lobe wedge  . Bronchoscopy    . Mediastinoscopy    . Cholecystectomy    . Appendectomy    . Hiatal hernia repair  2010   Family History  Problem Relation Age of Onset  . Heart attack Father   . Hypertension Mother   . Diabetes Mother   . Congestive Heart Failure Mother   . Breast cancer Sister   . Lung disease Neg Hx    Social History  Substance Use Topics  . Smoking status: Former Smoker -- 2.00 packs/day for 33 years    Types:  Cigarettes    Start date: 07/19/1967    Quit date: 01/23/2003  . Smokeless tobacco: Never Used  . Alcohol Use: No   OB History    No data available     Review of Systems  Constitutional: Negative for chills and fatigue.  Eyes: Negative for pain.  Cardiovascular: Positive for chest pain.  Gastrointestinal: Negative for abdominal pain.  Endocrine: Negative for polydipsia and polyuria.  Neurological: Negative for dizziness, syncope and weakness.  All other systems reviewed and are negative.     Allergies  Demerol; Mefoxin; Topamax; and Avelox  Home Medications   Prior to Admission medications   Medication Sig Start Date End Date Taking? Authorizing Provider  aspirin 81 MG tablet Take 81 mg by mouth daily.     Yes Historical Provider, MD  calcium-vitamin D (OSCAL) 250-125 MG-UNIT per tablet Take 1 tablet by mouth daily.     Yes Historical Provider, MD  Cholecalciferol (VITAMIN D) 2000 UNITS tablet Take 2,000 Units by mouth daily.   Yes Historical Provider, MD  fish oil-omega-3 fatty acids 1000 MG capsule Take 2 g by mouth daily.     Yes Historical Provider, MD  folic acid (FOLVITE) 1 MG tablet Take 1 mg by mouth daily.   Yes Historical Provider, MD  Ipratropium-Albuterol (COMBIVENT) 20-100 MCG/ACT AERS respimat  Inhale 1 puff into the lungs every 6 (six) hours as needed for wheezing. 02/20/15  Yes Volanda Napoleon, MD  KLOR-CON M20 20 MEQ tablet TAKE 1 TABLET BY MOUTH DAILY 12/10/14  Yes Volanda Napoleon, MD  omeprazole (PRILOSEC) 40 MG capsule Take 40 mg by mouth 2 (two) times daily.  12/05/13  Yes Historical Provider, MD  PROAIR RESPICLICK 416 (90 BASE) MCG/ACT AEPB USE 2 PUFFS EVERY 4-6 HOURS 10/11/14  Yes Historical Provider, MD  prochlorperazine (COMPAZINE) 10 MG tablet Take 10 mg by mouth every 6 (six) hours as needed for nausea or vomiting.  12/26/14  Yes Historical Provider, MD  Specialty Vitamins Products (ECHINACEA C COMPLETE PO) Take 1 tablet by mouth daily.    Yes Historical  Provider, MD  SUMAtriptan (IMITREX) 100 MG tablet Take 100 mg by mouth as needed for migraine or headache. Reported on 04/01/2015   Yes Historical Provider, MD  oxyCODONE (OXY IR/ROXICODONE) 5 MG immediate release tablet Take 1-2 pills, if needed, every 4 hours for pain secondary to cancer. 04/04/15   Merrily Pew, MD   BP 123/92 mmHg  Pulse 100  Temp(Src) 98.3 F (36.8 C) (Oral)  Resp 18  SpO2 93% Physical Exam  Constitutional: She appears well-developed and well-nourished.  HENT:  Head: Normocephalic and atraumatic.  Neck: Normal range of motion.  Cardiovascular: Regular rhythm.  Tachycardia present.   Pulmonary/Chest: Effort normal. No stridor. Tachypnea noted. No respiratory distress. She has decreased breath sounds in the left middle field and the left lower field. She has no wheezes. She has no rales. She exhibits no tenderness.  Abdominal: She exhibits no distension.  Neurological: She is alert.  Skin: Skin is warm and dry. No rash noted. No erythema.  Nursing note and vitals reviewed.   ED Course  Procedures (including critical care time) Labs Review Labs Reviewed  BASIC METABOLIC PANEL - Abnormal; Notable for the following:    Glucose, Bld 109 (*)    All other components within normal limits  CBC - Abnormal; Notable for the following:    RDW 17.8 (*)    All other components within normal limits  PROTIME-INR  TROPONIN I  I-STAT TROPOININ, ED    Imaging Review Ct Angio Chest Pe W/cm &/or Wo Cm  04/04/2015  CLINICAL DATA:  Sternal chest pain, left shoulder pain. Lung cancer. Last chemotherapy on Wednesday. EXAM: CT ANGIOGRAPHY CHEST WITH CONTRAST TECHNIQUE: Multidetector CT imaging of the chest was performed using the standard protocol during bolus administration of intravenous contrast. Multiplanar CT image reconstructions and MIPs were obtained to evaluate the vascular anatomy. CONTRAST:  178m OMNIPAQUE IOHEXOL 350 MG/ML SOLN COMPARISON:  PET CT 03/09/2015 FINDINGS: No  filling defects in the pulmonary arteries to suggest pulmonary emboli. Heart is upper limits normal in size. Small pericardial. Small left pleural effusion with atelectasis in the left lower lobe. Rounded atelectasis posteriorly in the left lower lobe. Pleural nodularity noted posteriorly in the left hemithorax, stable. Scarring noted in the lingula. Area of architectural distortion within the superior segment of the right lower lobe. Multiple pulmonary nodules in the right lower lobe are stable. Chest wall soft tissues are unremarkable. Imaging into the upper abdomen shows no acute findings. Numerous widespread sclerotic bony metastases noted in the ribs, spine and sternum, unchanged. Review of the MIP images confirms the above findings. IMPRESSION: No evidence of pulmonary embolus Small left pleural effusion with left lower lobe atelectasis. Pleural nodularity on the left and pulmonary nodules on the right are  essentially unchanged. Area of architectural distortion in the superior segment right lower lobe is unchanged. Postoperative changes on the right. Small pericardial effusion. Diffuse bony metastatic disease. Electronically Signed   By: Rolm Baptise M.D.   On: 04/04/2015 18:02   Dg Chest Portable 1 View  04/04/2015  CLINICAL DATA:  Chest pain, history of lung carcinoma EXAM: PORTABLE CHEST 1 VIEW COMPARISON:  02/10/2015 FINDINGS: Cardiac shadow is stable. A right chest wall port is again seen and stable. Chronic elevation of left hemidiaphragm is seen. Stable density in the right mid lung is again seen no new focal infiltrate or sizable effusion is noted. No acute bony abnormality is noted. IMPRESSION: No acute abnormality seen. Electronically Signed   By: Inez Catalina M.D.   On: 04/04/2015 16:04   I have personally reviewed and evaluated these images and lab results as part of my medical decision-making.   EKG Interpretation None      MDM   Final diagnoses:  Chest pain, unspecified chest pain  type  Bone pain   Likely L pleural effusion as symptoms are similar to that and patient has decreased BS on that side as well. If cxr negative, will consider D dimer/troponin/etc. CXR unremarkable. PE study negative. Delta troponins negative.  Pain likely 2/2 metastartic disease as she has significant bone mets and pain is pleuritic in nature. Pain controlled at time of discharge will discuss palliative options and long term pain meds with oncologist.     Merrily Pew, MD 04/04/15 [phone]

## 2015-04-04 NOTE — ED Notes (Signed)
Pt reports sternal CP, L shoulder pain and L shoulder blade pain that began at 0500 this am. No dizziness/lightheadedness. Pain with deep breath. Last chemo for lung ca on Wednesday.

## 2015-04-07 ENCOUNTER — Telehealth: Payer: Self-pay | Admitting: *Deleted

## 2015-04-07 NOTE — Telephone Encounter (Signed)
Patient called stating she was in the ED on Saturday night with increased pain.  They did CXR and CT scan.  Still having more pain .  Dr. Marin Olp increased pain medicine to Oxycodone 2 tablets every 4 hours as needed for pain. Instructed patient to take Advil 1-2 every 6 hours in addition for additional relief

## 2015-04-22 ENCOUNTER — Ambulatory Visit (HOSPITAL_BASED_OUTPATIENT_CLINIC_OR_DEPARTMENT_OTHER): Payer: PPO

## 2015-04-22 ENCOUNTER — Ambulatory Visit (HOSPITAL_BASED_OUTPATIENT_CLINIC_OR_DEPARTMENT_OTHER)
Admission: RE | Admit: 2015-04-22 | Discharge: 2015-04-22 | Disposition: A | Discharge status: Home / Self Care | Payer: PPO | Source: Ambulatory Visit | Attending: Hematology & Oncology | Admitting: Hematology & Oncology

## 2015-04-22 ENCOUNTER — Other Ambulatory Visit (HOSPITAL_BASED_OUTPATIENT_CLINIC_OR_DEPARTMENT_OTHER): Payer: PPO

## 2015-04-22 ENCOUNTER — Ambulatory Visit (HOSPITAL_BASED_OUTPATIENT_CLINIC_OR_DEPARTMENT_OTHER): Payer: PPO | Admitting: Hematology & Oncology

## 2015-04-22 ENCOUNTER — Encounter: Payer: Self-pay | Admitting: Hematology & Oncology

## 2015-04-22 VITALS — BP 127/90 | HR 96 | Temp 98.1°F | Resp 18 | Wt 121.8 lb

## 2015-04-22 DIAGNOSIS — J0110 Acute frontal sinusitis, unspecified: Secondary | ICD-10-CM | POA: Insufficient documentation

## 2015-04-22 DIAGNOSIS — C3490 Malignant neoplasm of unspecified part of unspecified bronchus or lung: Secondary | ICD-10-CM | POA: Diagnosis not present

## 2015-04-22 DIAGNOSIS — C3430 Malignant neoplasm of lower lobe, unspecified bronchus or lung: Secondary | ICD-10-CM | POA: Diagnosis not present

## 2015-04-22 DIAGNOSIS — C3491 Malignant neoplasm of unspecified part of right bronchus or lung: Secondary | ICD-10-CM | POA: Diagnosis not present

## 2015-04-22 DIAGNOSIS — I700 Atherosclerosis of aorta: Secondary | ICD-10-CM | POA: Diagnosis not present

## 2015-04-22 DIAGNOSIS — C7951 Secondary malignant neoplasm of bone: Secondary | ICD-10-CM | POA: Diagnosis not present

## 2015-04-22 DIAGNOSIS — C7989 Secondary malignant neoplasm of other specified sites: Secondary | ICD-10-CM

## 2015-04-22 DIAGNOSIS — R05 Cough: Secondary | ICD-10-CM | POA: Diagnosis not present

## 2015-04-22 DIAGNOSIS — Z79899 Other long term (current) drug therapy: Secondary | ICD-10-CM

## 2015-04-22 DIAGNOSIS — J910 Malignant pleural effusion: Secondary | ICD-10-CM | POA: Diagnosis not present

## 2015-04-22 DIAGNOSIS — R059 Cough, unspecified: Secondary | ICD-10-CM

## 2015-04-22 DIAGNOSIS — C779 Secondary and unspecified malignant neoplasm of lymph node, unspecified: Secondary | ICD-10-CM | POA: Diagnosis not present

## 2015-04-22 DIAGNOSIS — R918 Other nonspecific abnormal finding of lung field: Secondary | ICD-10-CM | POA: Insufficient documentation

## 2015-04-22 DIAGNOSIS — R0600 Dyspnea, unspecified: Secondary | ICD-10-CM

## 2015-04-22 LAB — CMP (CANCER CENTER ONLY)
ALK PHOS: 143 U/L — AB (ref 26–84)
ALT: 17 U/L (ref 10–47)
AST: 19 U/L (ref 11–38)
Albumin: 4 g/dL (ref 3.3–5.5)
BILIRUBIN TOTAL: 0.7 mg/dL (ref 0.20–1.60)
BUN: 15 mg/dL (ref 7–22)
CO2: 25 mEq/L (ref 18–33)
CREATININE: 0.8 mg/dL (ref 0.6–1.2)
Calcium: 8.6 mg/dL (ref 8.0–10.3)
Chloride: 102 mEq/L (ref 98–108)
Glucose, Bld: 91 mg/dL (ref 73–118)
POTASSIUM: 3.3 meq/L (ref 3.3–4.7)
Sodium: 140 mEq/L (ref 128–145)
TOTAL PROTEIN: 7.2 g/dL (ref 6.4–8.1)

## 2015-04-22 LAB — CBC WITH DIFFERENTIAL (CANCER CENTER ONLY)
BASO#: 0 10*3/uL (ref 0.0–0.2)
BASO%: 0.5 % (ref 0.0–2.0)
EOS%: 2.1 % (ref 0.0–7.0)
Eosinophils Absolute: 0.2 10*3/uL (ref 0.0–0.5)
HCT: 35.9 % (ref 34.8–46.6)
HGB: 11.7 g/dL (ref 11.6–15.9)
LYMPH#: 1.9 10*3/uL (ref 0.9–3.3)
LYMPH%: 22.4 % (ref 14.0–48.0)
MCH: 27.5 pg (ref 26.0–34.0)
MCHC: 32.6 g/dL (ref 32.0–36.0)
MCV: 84 fL (ref 81–101)
MONO#: 0.8 10*3/uL (ref 0.1–0.9)
MONO%: 9.1 % (ref 0.0–13.0)
NEUT#: 5.5 10*3/uL (ref 1.5–6.5)
NEUT%: 65.9 % (ref 39.6–80.0)
PLATELETS: 295 10*3/uL (ref 145–400)
RBC: 4.26 10*6/uL (ref 3.70–5.32)
RDW: 18.1 % — ABNORMAL HIGH (ref 11.1–15.7)
WBC: 8.4 10*3/uL (ref 3.9–10.0)

## 2015-04-22 LAB — LACTATE DEHYDROGENASE: LDH: 231 U/L (ref 125–245)

## 2015-04-22 LAB — TSH: TSH: 1.082 m[IU]/L (ref 0.308–3.960)

## 2015-04-22 MED ORDER — HEPARIN SOD (PORK) LOCK FLUSH 100 UNIT/ML IV SOLN
500.0000 [IU] | Freq: Once | INTRAVENOUS | Status: AC | PRN
  Administered 2015-04-22: 500 [IU]
  Filled 2015-04-22: qty 5

## 2015-04-22 MED ORDER — SODIUM CHLORIDE 0.9 % IV SOLN
1200.0000 mg | Freq: Once | INTRAVENOUS | Status: AC
  Administered 2015-04-22: 1200 mg via INTRAVENOUS
  Filled 2015-04-22: qty 20

## 2015-04-22 MED ORDER — HYDROMORPHONE HCL 1 MG/ML IJ SOLN
INTRAMUSCULAR | Status: AC
  Filled 2015-04-22: qty 1

## 2015-04-22 MED ORDER — SODIUM CHLORIDE 0.9 % IV SOLN
Freq: Once | INTRAVENOUS | Status: AC
  Administered 2015-04-22: 12:00:00 via INTRAVENOUS

## 2015-04-22 MED ORDER — HYDROMORPHONE HCL 1 MG/ML IJ SOLN
1.0000 mg | INTRAMUSCULAR | Status: DC | PRN
Start: 2015-04-22 — End: 2015-04-22
  Administered 2015-04-22: 1 mg via INTRAVENOUS

## 2015-04-22 MED ORDER — SODIUM CHLORIDE 0.9 % IJ SOLN
10.0000 mL | INTRAMUSCULAR | Status: DC | PRN
  Administered 2015-04-22: 10 mL
  Filled 2015-04-22: qty 10

## 2015-04-22 MED ORDER — KETOROLAC TROMETHAMINE 15 MG/ML IJ SOLN
INTRAMUSCULAR | Status: AC
  Filled 2015-04-22: qty 2

## 2015-04-22 MED ORDER — KETOROLAC TROMETHAMINE 30 MG/ML IJ SOLN
30.0000 mg | Freq: Once | INTRAMUSCULAR | Status: AC
  Administered 2015-04-22: 30 mg via INTRAVENOUS
  Filled 2015-04-22: qty 1

## 2015-04-22 NOTE — Patient Instructions (Addendum)
[facility] Discharge Instructions for Patients Receiving Chemotherapy  Today you received the following chemotherapy agents Tecentriq  To help prevent nausea and vomiting after your treatment, we encourage you to take your nausea medication   If you develop nausea and vomiting that is not controlled by your nausea medication, call the clinic.   BELOW ARE SYMPTOMS THAT SHOULD BE REPORTED IMMEDIATELY:  *FEVER GREATER THAN 100.5 F  *CHILLS WITH OR WITHOUT FEVER  NAUSEA AND VOMITING THAT IS NOT CONTROLLED WITH YOUR NAUSEA MEDICATION  *UNUSUAL SHORTNESS OF BREATH  *UNUSUAL BRUISING OR BLEEDING  TENDERNESS IN MOUTH AND THROAT WITH OR WITHOUT PRESENCE OF ULCERS  *URINARY PROBLEMS  *BOWEL PROBLEMS  UNUSUAL RASH Items with * indicate a potential emergency and should be followed up as soon as possible.  Feel free to call the clinic you have any questions or concerns. The clinic phone number is [phone].  Please show the CHEMO ALERT CARD at check-in to the Emergency Department and triage nurse.   

## 2015-04-22 NOTE — Progress Notes (Signed)
Hematology and Oncology Follow Up Visit  PERRIS TRIPATHI [id number] [birth year] 65 y.o. 04/22/2015   Principle Diagnosis:  Metastatic adenocarcinoma the lung - bone metastases, lymph node metastasis and intrapulmonary metastasis Malignant left pleural effusion  Current Therapy:   Tecentriq s/p cycle 4 Xgeva 120 mg subcutaneous every month    Interim History:  Ms. Balsley is here today with her husband for follow-up.  She looks so much better.   She's having more back pain. I worried that this might indicate progressive disease. We will have to repeat the scans on her.  Her appetite is doing better. She is eating better. She is worried about losing some weight.  She's had some abdominal discomfort. She does have a hiatal hernia.  She's had no problems with diarrhea or constipation.  She recently had a sinus infection. She currently is on clarithromycin for this.  She's had no headache. She's had some sinus congestion from the infection.  She's had no leg swelling.  She's had no rashes.   Overall, her performance status is ECOG 1.    Medications:    Medication List       This list is accurate as of: 04/22/15 12:08 PM.  Always use your most recent med list.               aspirin 81 MG tablet  Take 81 mg by mouth daily.     calcium-vitamin D 250-125 MG-UNIT tablet  Commonly known as:  OSCAL  Take 1 tablet by mouth daily.     clarithromycin 500 MG tablet  Commonly known as:  BIAXIN  Take 500 mg by mouth 2 (two) times daily.     ECHINACEA C COMPLETE PO  Take 1 tablet by mouth daily.     fish oil-omega-3 fatty acids 1000 MG capsule  Take 2 g by mouth daily.     folic acid 1 MG tablet  Commonly known as:  FOLVITE  Take 1 mg by mouth daily.     Ipratropium-Albuterol 20-100 MCG/ACT Aers respimat  Commonly known as:  COMBIVENT  Inhale 1 puff into the lungs every 6 (six) hours as needed for wheezing.     KLOR-CON M20 20 MEQ tablet  Generic drug:  potassium  chloride SA  TAKE 1 TABLET BY MOUTH DAILY     omeprazole 40 MG capsule  Commonly known as:  PRILOSEC  Take 40 mg by mouth 2 (two) times daily.     oxyCODONE 5 MG immediate release tablet  Commonly known as:  Oxy IR/ROXICODONE  Take 1-2 pills, if needed, every 4 hours for pain secondary to cancer.     PROAIR RESPICLICK 301 (90 Base) MCG/ACT Aepb  Generic drug:  Albuterol Sulfate  USE 2 PUFFS EVERY 4-6 HOURS     prochlorperazine 10 MG tablet  Commonly known as:  COMPAZINE  Take 10 mg by mouth every 6 (six) hours as needed for nausea or vomiting.     SUMAtriptan 100 MG tablet  Commonly known as:  IMITREX  Take 100 mg by mouth as needed for migraine or headache. Reported on 04/01/2015     Vitamin D 2000 units tablet  Take 2,000 Units by mouth daily.        Allergies:  Allergies  Allergen Reactions  . Demerol Other (See Comments)    Syncope  . Mefoxin [Cefoxitin Sodium In Dextrose]     rash  . Topamax Other (See Comments)    Had trouble walking  . Avelox [Moxifloxacin  Hcl In Nacl]     Hypotension.    Past Medical History, Surgical history, Social history, and Family History were reviewed and updated.  Review of Systems: All other 10 point review of systems is negative.   Physical Exam:  weight is 121 lb 12 oz (55.225 kg). Her oral temperature is 98.1 F (36.7 C). Her blood pressure is 127/90 and her pulse is 96. Her respiration is 18.   Wt Readings from Last 3 Encounters:  04/22/15 121 lb 12 oz (55.225 kg)  04/01/15 123 lb (55.792 kg)  03/11/15 124 lb (56.246 kg)    Well-developed and well-nourished white female in no obvious distress. Head and neck exam shows no ocular or oral lesions. There are no palpable cervical or supraclavicular lymph nodes. Lungs are clear on the right side. There is still some slight decrease breath sounds on the left side. Cardiac exam regular rate and rhythm with no murmurs, rubs or bruits. Abdomen is soft. She is good bowel sounds. There  is no fluid wave. There is no palpable liver or spleen tip. Back exam shows no tenderness over the spine, ribs or hips. Extremities shows no clubbing, cyanosis or edema. Skin exam shows no rashes, ecchymoses or petechia. Neurological exam shows no focal neurological deficits.   Lab Results  Component Value Date   WBC 8.4 04/22/2015   HGB 11.7 04/22/2015   HCT 35.9 04/22/2015   MCV 84 04/22/2015   PLT 295 04/22/2015   Lab Results  Component Value Date   FERRITIN 47 10/29/2013   IRON 64 10/29/2013   TIBC 233* 10/29/2013   UIBC 169 10/29/2013   IRONPCTSAT 27 10/29/2013   Lab Results  Component Value Date   RETICCTPCT 1.4 08/13/2008   RBC 4.26 04/22/2015   RETICCTABS 61.6 08/13/2008   No results found for: KPAFRELGTCHN, LAMBDASER, KAPLAMBRATIO No results found for: IGGSERUM, IGA, IGMSERUM No results found for: Odetta Pink, SPEI   Chemistry      Component Value Date/Time   NA 140 04/22/2015 1030   NA 140 04/04/2015 1548   K 3.3 04/22/2015 1030   K 4.2 04/04/2015 1548   CL 102 04/22/2015 1030   CL 105 04/04/2015 1548   CO2 25 04/22/2015 1030   CO2 25 04/04/2015 1548   BUN 15 04/22/2015 1030   BUN 11 04/04/2015 1548   CREATININE 0.8 04/22/2015 1030   CREATININE 0.50 04/04/2015 1548      Component Value Date/Time   CALCIUM 8.6 04/22/2015 1030   CALCIUM 9.0 04/04/2015 1548   ALKPHOS 143* 04/22/2015 1030   ALKPHOS 75 01/11/2015 0944   AST 19 04/22/2015 1030   AST 18 01/11/2015 0944   ALT 17 04/22/2015 1030   ALT 10* 01/11/2015 0944   BILITOT 0.70 04/22/2015 1030   BILITOT 0.9 01/11/2015 0944     Impression and Plan: Ms. Skorupski is a pleasant 65 yo white female with metastatic adenocarcinoma of the lung. She has no mutations that we can target.  We tried her on systemic chemotherapy which she tolerated very poorly.  She is doing quite well with Tecentriq and I'm glad for this. Her quality of life is better.   I  worry that she is having more convocation of her malignancy. I think the back certainly could  indicate more disease.  I think we will have to set her up with another PET scan. I think this will help [place] to look at the back and see if  she has more problems.I M just grateful that she is tolerating treatment so well and that her quality of life is much better.  I spent about 25-30 minutes with them today.  Volanda Napoleon, MD 3/15/201712:08 PM

## 2015-04-23 LAB — PREALBUMIN: PREALBUMIN: 24 mg/dL (ref 10–36)

## 2015-04-24 ENCOUNTER — Other Ambulatory Visit: Payer: Self-pay | Admitting: *Deleted

## 2015-04-24 ENCOUNTER — Telehealth: Payer: Self-pay | Admitting: *Deleted

## 2015-04-24 DIAGNOSIS — G893 Neoplasm related pain (acute) (chronic): Secondary | ICD-10-CM

## 2015-04-24 MED ORDER — MELOXICAM 15 MG PO TABS: 15.0000 mg | ORAL_TABLET | Freq: Every day | ORAL | Status: DC

## 2015-04-24 NOTE — Telephone Encounter (Signed)
Patient had a new chemo treatment on Wednesday, TECENTRIQ, and she has been experiencing pain in her joints and muscles for the last 24h. She is uncomfortable and her normal pain medicine is ineffective.   Spoke with the pharmacist who states that this medication can cause arthralgias in 22% of patients with lung cancer.  Relayed information to Dr Marin Olp. He wants patient to take Claritin '10mg'$  and to start mobic daily. Prescription sent. Patient aware of instruction and new prescription.

## 2015-05-08 ENCOUNTER — Ambulatory Visit (HOSPITAL_COMMUNITY)
Admission: RE | Admit: 2015-05-08 | Discharge: 2015-05-08 | Disposition: A | Discharge status: Home / Self Care | Payer: PPO | Source: Ambulatory Visit | Attending: Hematology & Oncology | Admitting: Hematology & Oncology

## 2015-05-08 DIAGNOSIS — C3490 Malignant neoplasm of unspecified part of unspecified bronchus or lung: Secondary | ICD-10-CM | POA: Diagnosis not present

## 2015-05-08 DIAGNOSIS — Z9221 Personal history of antineoplastic chemotherapy: Secondary | ICD-10-CM | POA: Diagnosis not present

## 2015-05-08 DIAGNOSIS — C771 Secondary and unspecified malignant neoplasm of intrathoracic lymph nodes: Secondary | ICD-10-CM | POA: Diagnosis not present

## 2015-05-08 DIAGNOSIS — C7951 Secondary malignant neoplasm of bone: Secondary | ICD-10-CM | POA: Diagnosis not present

## 2015-05-08 DIAGNOSIS — R918 Other nonspecific abnormal finding of lung field: Secondary | ICD-10-CM | POA: Diagnosis not present

## 2015-05-08 LAB — GLUCOSE, CAPILLARY: GLUCOSE-CAPILLARY: 83 mg/dL (ref 65–99)

## 2015-05-08 MED ORDER — FLUDEOXYGLUCOSE F - 18 (FDG) INJECTION
5.8900 | Freq: Once | INTRAVENOUS | Status: AC | PRN
  Administered 2015-05-08: 5.89 via INTRAVENOUS

## 2015-05-12 ENCOUNTER — Ambulatory Visit (HOSPITAL_BASED_OUTPATIENT_CLINIC_OR_DEPARTMENT_OTHER): Payer: PPO | Admitting: Hematology & Oncology

## 2015-05-12 ENCOUNTER — Other Ambulatory Visit (HOSPITAL_BASED_OUTPATIENT_CLINIC_OR_DEPARTMENT_OTHER): Payer: PPO

## 2015-05-12 ENCOUNTER — Ambulatory Visit (HOSPITAL_BASED_OUTPATIENT_CLINIC_OR_DEPARTMENT_OTHER): Payer: PPO

## 2015-05-12 DIAGNOSIS — Z452 Encounter for adjustment and management of vascular access device: Secondary | ICD-10-CM

## 2015-05-12 DIAGNOSIS — C3491 Malignant neoplasm of unspecified part of right bronchus or lung: Secondary | ICD-10-CM | POA: Diagnosis not present

## 2015-05-12 DIAGNOSIS — C7800 Secondary malignant neoplasm of unspecified lung: Secondary | ICD-10-CM

## 2015-05-12 DIAGNOSIS — C7951 Secondary malignant neoplasm of bone: Secondary | ICD-10-CM

## 2015-05-12 DIAGNOSIS — C3490 Malignant neoplasm of unspecified part of unspecified bronchus or lung: Secondary | ICD-10-CM | POA: Diagnosis not present

## 2015-05-12 DIAGNOSIS — C801 Malignant (primary) neoplasm, unspecified: Secondary | ICD-10-CM | POA: Diagnosis not present

## 2015-05-12 DIAGNOSIS — C779 Secondary and unspecified malignant neoplasm of lymph node, unspecified: Secondary | ICD-10-CM

## 2015-05-12 DIAGNOSIS — J90 Pleural effusion, not elsewhere classified: Secondary | ICD-10-CM

## 2015-05-12 LAB — CBC WITH DIFFERENTIAL (CANCER CENTER ONLY)
BASO#: 0.1 10*3/uL (ref 0.0–0.2)
BASO%: 1 % (ref 0.0–2.0)
EOS%: 4 % (ref 0.0–7.0)
Eosinophils Absolute: 0.3 10*3/uL (ref 0.0–0.5)
HEMATOCRIT: 37.5 % (ref 34.8–46.6)
HGB: 12.6 g/dL (ref 11.6–15.9)
LYMPH#: 1.7 10*3/uL (ref 0.9–3.3)
LYMPH%: 20 % (ref 14.0–48.0)
MCH: 28.3 pg (ref 26.0–34.0)
MCHC: 33.6 g/dL (ref 32.0–36.0)
MCV: 84 fL (ref 81–101)
MONO#: 0.7 10*3/uL (ref 0.1–0.9)
MONO%: 8.5 % (ref 0.0–13.0)
NEUT#: 5.6 10*3/uL (ref 1.5–6.5)
NEUT%: 66.5 % (ref 39.6–80.0)
Platelets: 246 10*3/uL (ref 145–400)
RBC: 4.46 10*6/uL (ref 3.70–5.32)
RDW: 17.2 % — AB (ref 11.1–15.7)
WBC: 8.4 10*3/uL (ref 3.9–10.0)

## 2015-05-12 LAB — CMP (CANCER CENTER ONLY)
ALT(SGPT): 16 U/L (ref 10–47)
AST: 20 U/L (ref 11–38)
Albumin: 4 g/dL (ref 3.3–5.5)
Alkaline Phosphatase: 148 U/L — ABNORMAL HIGH (ref 26–84)
BUN, Bld: 10 mg/dL (ref 7–22)
CALCIUM: 8.7 mg/dL (ref 8.0–10.3)
CO2: 25 meq/L (ref 18–33)
Chloride: 104 mEq/L (ref 98–108)
Creat: 0.5 mg/dl — ABNORMAL LOW (ref 0.6–1.2)
GLUCOSE: 91 mg/dL (ref 73–118)
Potassium: 3.6 mEq/L (ref 3.3–4.7)
SODIUM: 138 meq/L (ref 128–145)
Total Bilirubin: 0.7 mg/dl (ref 0.20–1.60)
Total Protein: 7.5 g/dL (ref 6.4–8.1)

## 2015-05-12 LAB — LACTATE DEHYDROGENASE: LDH: 314 U/L — ABNORMAL HIGH (ref 125–245)

## 2015-05-12 MED ORDER — SODIUM CHLORIDE 0.9 % IJ SOLN
10.0000 mL | INTRAMUSCULAR | Status: DC | PRN
  Administered 2015-05-12: 10 mL via INTRAVENOUS
  Filled 2015-05-12: qty 10

## 2015-05-12 MED ORDER — HEPARIN SOD (PORK) LOCK FLUSH 100 UNIT/ML IV SOLN
500.0000 [IU] | Freq: Once | INTRAVENOUS | Status: AC | PRN
  Administered 2015-05-12: 500 [IU] via INTRAVENOUS
  Filled 2015-05-12: qty 5

## 2015-05-12 NOTE — Progress Notes (Signed)
No chemo today per dr. Marin Olp.  Port flushed

## 2015-05-12 NOTE — Patient Instructions (Signed)
Implanted Port Insertion, Care After °Refer to this sheet in the next few weeks. These instructions provide you with information on caring for yourself after your procedure. Your health care provider may also give you more specific instructions. Your treatment has been planned according to current medical practices, but problems sometimes occur. Call your health care provider if you have any problems or questions after your procedure. °WHAT TO EXPECT AFTER THE PROCEDURE °After your procedure, it is typical to have the following:  °· Discomfort at the port insertion site. Ice packs to the area will help. °· Bruising on the skin over the port. This will subside in 3-4 days. °HOME CARE INSTRUCTIONS °· After your port is placed, you will get a manufacturer's information card. The card has information about your port. Keep this card with you at all times.   °· Know what kind of port you have. There are many types of ports available.   °· Wear a medical alert bracelet in case of an emergency. This can help alert health care workers that you have a port.   °· The port can stay in for as long as your health care provider believes it is necessary.   °· A home health care nurse may give medicines and take care of the port.   °· You or a family member can get special training and directions for giving medicine and taking care of the port at home.   °SEEK MEDICAL CARE IF:  °· Your port does not flush or you are unable to get a blood return.   °· You have a fever or chills. °SEEK IMMEDIATE MEDICAL CARE IF: °· You have new fluid or pus coming from your incision.   °· You notice a bad smell coming from your incision site.   °· You have swelling, pain, or more redness at the incision or port site.   °· You have chest pain or shortness of breath. °  °This information is not intended to replace advice given to you by your health care provider. Make sure you discuss any questions you have with your health care provider. °  °Document  Released: 11/14/2012 Document Revised: 01/29/2013 Document Reviewed: 11/14/2012 °Elsevier Interactive Patient Education ©2016 [employer] ° °

## 2015-05-13 ENCOUNTER — Other Ambulatory Visit: Payer: PPO

## 2015-05-13 ENCOUNTER — Ambulatory Visit: Payer: PPO | Admitting: Family

## 2015-05-13 ENCOUNTER — Ambulatory Visit: Payer: PPO

## 2015-05-13 LAB — PREALBUMIN: PREALBUMIN: 19 mg/dL (ref 10–36)

## 2015-05-13 NOTE — Progress Notes (Signed)
Hematology and Oncology Follow Up Visit  [name] [id number] [birth year] 65 y.o. 05/13/2015   Principle Diagnosis:  Metastatic adenocarcinoma the lung - bone metastases, lymph node metastasis and intrapulmonary metastasis Malignant left pleural effusion  Current Therapy:  Navelbine q 2wk dosing - start 4/12  Tecentriq s/p cycle 4 Xgeva 120 mg subcutaneous every month    Interim History:  Ms. Burtis is here today with her husband and family for follow-up.    Unfortunately, her last PET scan showed she was progressing. She had more in the way of bony metastasis. She also had increase in lymph node metastasis. Her left pleural effusion seems to be holding pretty stable.  I'm just very disappointed that she is progressing. She tolerated Tecentriq very well. This was a really good treatment for her as her quality of life was doing better.  She is having a lot of pain issues with her bones. She's having pain in the lower back. I'm not sure if this is from the metastases.  Her performance status is probably ECOG 1-2.  Her appetite is holding steady. She's had no nausea or vomiting.  Is no issues with bowels or bladder.  She's had no bleeding. She's had no fever.  Medications:    Medication List       This list is accurate as of: 05/12/15 11:59 PM.  Always use your most recent med list.               aspirin 81 MG tablet  Take 81 mg by mouth daily.     calcium-vitamin D 250-125 MG-UNIT tablet  Commonly known as:  OSCAL  Take 1 tablet by mouth daily.     clarithromycin 500 MG tablet  Commonly known as:  BIAXIN  Take 500 mg by mouth 2 (two) times daily.     ECHINACEA C COMPLETE PO  Take 1 tablet by mouth daily.     fish oil-omega-3 fatty acids 1000 MG capsule  Take 2 g by mouth daily.     folic acid 1 MG tablet  Commonly known as:  FOLVITE  Take 1 mg by mouth daily.     Ipratropium-Albuterol 20-100 MCG/ACT Aers respimat  Commonly known as:  COMBIVENT  Inhale  1 puff into the lungs every 6 (six) hours as needed for wheezing.     KLOR-CON M20 20 MEQ tablet  Generic drug:  potassium chloride SA  TAKE 1 TABLET BY MOUTH DAILY     omeprazole 40 MG capsule  Commonly known as:  PRILOSEC  Take 40 mg by mouth 2 (two) times daily.     oxyCODONE 5 MG immediate release tablet  Commonly known as:  Oxy IR/ROXICODONE  Take 1-2 pills, if needed, every 4 hours for pain secondary to cancer.     PROAIR RESPICLICK 245 (90 Base) MCG/ACT Aepb  Generic drug:  Albuterol Sulfate  USE 2 PUFFS EVERY 4-6 HOURS     prochlorperazine 10 MG tablet  Commonly known as:  COMPAZINE  Take 10 mg by mouth every 6 (six) hours as needed for nausea or vomiting.     SUMAtriptan 100 MG tablet  Commonly known as:  IMITREX  Take 100 mg by mouth as needed for migraine or headache. Reported on 04/01/2015     Vitamin D 2000 units tablet  Take 2,000 Units by mouth daily.        Allergies:  Allergies  Allergen Reactions  . Demerol Other (See Comments)    Syncope  .  Mefoxin [Cefoxitin Sodium In Dextrose]     rash  . Topamax Other (See Comments)    Had trouble walking  . Avelox [Moxifloxacin Hcl In Nacl]     Hypotension.    Past Medical History, Surgical history, Social history, and Family History were reviewed and updated.  Review of Systems: All other 10 point review of systems is negative.   Physical Exam:  vitals were not taken for this visit.  Wt Readings from Last 3 Encounters:  04/22/15 121 lb 12 oz (55.225 kg)  04/01/15 123 lb (55.792 kg)  03/11/15 124 lb (56.246 kg)    Well-developed and well-nourished white female in no obvious distress. Head and neck exam shows no ocular or oral lesions. There are no palpable cervical or supraclavicular lymph nodes. Lungs are clear on the right side. There is still some slight decrease breath sounds on the left side. Cardiac exam regular rate and rhythm with no murmurs, rubs or bruits. Abdomen is soft. She is good bowel  sounds. There is no fluid wave. There is no palpable liver or spleen tip. Back exam shows no tenderness over the spine, ribs or hips. Extremities shows no clubbing, cyanosis or edema. Skin exam shows no rashes, ecchymoses or petechia. Neurological exam shows no focal neurological deficits.   Lab Results  Component Value Date   WBC 8.4 05/12/2015   HGB 12.6 05/12/2015   HCT 37.5 05/12/2015   MCV 84 05/12/2015   PLT 246 05/12/2015   Lab Results  Component Value Date   FERRITIN 47 10/29/2013   IRON 64 10/29/2013   TIBC 233* 10/29/2013   UIBC 169 10/29/2013   IRONPCTSAT 27 10/29/2013   Lab Results  Component Value Date   RETICCTPCT 1.4 08/13/2008   RBC 4.46 05/12/2015   RETICCTABS 61.6 08/13/2008   No results found for: KPAFRELGTCHN, LAMBDASER, KAPLAMBRATIO No results found for: IGGSERUM, IGA, IGMSERUM No results found for: Odetta Pink, SPEI   Chemistry      Component Value Date/Time   NA 138 05/12/2015 0942   NA 140 04/04/2015 1548   K 3.6 05/12/2015 0942   K 4.2 04/04/2015 1548   CL 104 05/12/2015 0942   CL 105 04/04/2015 1548   CO2 25 05/12/2015 0942   CO2 25 04/04/2015 1548   BUN 10 05/12/2015 0942   BUN 11 04/04/2015 1548   CREATININE 0.5* 05/12/2015 0942   CREATININE 0.50 04/04/2015 1548      Component Value Date/Time   CALCIUM 8.7 05/12/2015 0942   CALCIUM 9.0 04/04/2015 1548   ALKPHOS 148* 05/12/2015 0942   ALKPHOS 75 01/11/2015 0944   AST 20 05/12/2015 0942   AST 18 01/11/2015 0944   ALT 16 05/12/2015 0942   ALT 10* 01/11/2015 0944   BILITOT 0.70 05/12/2015 0942   BILITOT 0.9 01/11/2015 0944     Impression and Plan: Ms. Gotschall is a pleasant 65 yo white female with metastatic adenocarcinoma of the lung. She has no mutations that we can target.  Unfortunately, she is progressing again.  She really had a tough time with systemic chemotherapy. I just don't think she'll tolerate combination  therapy.  I think single agent Navelbine would be reasonable. I suppose we could add Avastin to this. She has had Avastin in the past without any problems.  I spent about 45 minutes talking with she and her family. I have known Ms. Mccain for about 12 years. I just feel bad that she is progressing.  She has a lot of family issues that she needs to take care of. I told her that the chance of responding to treatment probably would be no more than 20%.  We'll go and try to get started next week.  We will certainly pray hard for her.  Dierdre Searles, MD 4/5/20177:16 AM

## 2015-05-14 ENCOUNTER — Other Ambulatory Visit: Payer: Self-pay | Admitting: *Deleted

## 2015-05-14 DIAGNOSIS — C3430 Malignant neoplasm of lower lobe, unspecified bronchus or lung: Secondary | ICD-10-CM

## 2015-05-14 DIAGNOSIS — R64 Cachexia: Secondary | ICD-10-CM

## 2015-05-14 DIAGNOSIS — R053 Chronic cough: Secondary | ICD-10-CM

## 2015-05-14 DIAGNOSIS — R05 Cough: Secondary | ICD-10-CM

## 2015-05-14 MED ORDER — OXYCODONE HCL 5 MG PO TABS: ORAL_TABLET | ORAL | Status: DC

## 2015-05-18 ENCOUNTER — Other Ambulatory Visit: Payer: Self-pay | Admitting: Nurse Practitioner

## 2015-05-20 ENCOUNTER — Other Ambulatory Visit (HOSPITAL_BASED_OUTPATIENT_CLINIC_OR_DEPARTMENT_OTHER): Payer: PPO

## 2015-05-20 ENCOUNTER — Other Ambulatory Visit: Payer: Self-pay

## 2015-05-20 ENCOUNTER — Other Ambulatory Visit: Payer: Self-pay | Admitting: Hematology & Oncology

## 2015-05-20 ENCOUNTER — Other Ambulatory Visit: Payer: PPO

## 2015-05-20 ENCOUNTER — Other Ambulatory Visit: Payer: Self-pay | Admitting: *Deleted

## 2015-05-20 ENCOUNTER — Other Ambulatory Visit: Payer: Self-pay | Admitting: Emergency Medicine

## 2015-05-20 ENCOUNTER — Ambulatory Visit: Payer: PPO

## 2015-05-20 ENCOUNTER — Ambulatory Visit (HOSPITAL_BASED_OUTPATIENT_CLINIC_OR_DEPARTMENT_OTHER): Payer: PPO

## 2015-05-20 VITALS — BP 117/76 | HR 92 | Temp 98.9°F | Resp 20 | Wt 125.0 lb

## 2015-05-20 DIAGNOSIS — Z5111 Encounter for antineoplastic chemotherapy: Secondary | ICD-10-CM | POA: Diagnosis not present

## 2015-05-20 DIAGNOSIS — C3491 Malignant neoplasm of unspecified part of right bronchus or lung: Secondary | ICD-10-CM

## 2015-05-20 DIAGNOSIS — C7951 Secondary malignant neoplasm of bone: Secondary | ICD-10-CM

## 2015-05-20 DIAGNOSIS — J910 Malignant pleural effusion: Secondary | ICD-10-CM

## 2015-05-20 DIAGNOSIS — Z5112 Encounter for antineoplastic immunotherapy: Secondary | ICD-10-CM | POA: Diagnosis not present

## 2015-05-20 DIAGNOSIS — C342 Malignant neoplasm of middle lobe, bronchus or lung: Secondary | ICD-10-CM

## 2015-05-20 DIAGNOSIS — C3400 Malignant neoplasm of unspecified main bronchus: Secondary | ICD-10-CM

## 2015-05-20 LAB — CBC WITH DIFFERENTIAL (CANCER CENTER ONLY)
BASO#: 0.1 10*3/uL (ref 0.0–0.2)
BASO%: 0.8 % (ref 0.0–2.0)
EOS%: 3.9 % (ref 0.0–7.0)
Eosinophils Absolute: 0.3 10*3/uL (ref 0.0–0.5)
HCT: 34.3 % — ABNORMAL LOW (ref 34.8–46.6)
HGB: 11.4 g/dL — ABNORMAL LOW (ref 11.6–15.9)
LYMPH#: 1.4 10*3/uL (ref 0.9–3.3)
LYMPH%: 18.3 % (ref 14.0–48.0)
MCH: 28.2 pg (ref 26.0–34.0)
MCHC: 33.2 g/dL (ref 32.0–36.0)
MCV: 85 fL (ref 81–101)
MONO#: 0.7 10*3/uL (ref 0.1–0.9)
MONO%: 8.7 % (ref 0.0–13.0)
NEUT%: 68.3 % (ref 39.6–80.0)
NEUTROS ABS: 5.1 10*3/uL (ref 1.5–6.5)
PLATELETS: 244 10*3/uL (ref 145–400)
RBC: 4.04 10*6/uL (ref 3.70–5.32)
RDW: 16.5 % — AB (ref 11.1–15.7)
WBC: 7.5 10*3/uL (ref 3.9–10.0)

## 2015-05-20 LAB — CMP (CANCER CENTER ONLY)
ALT(SGPT): 18 U/L (ref 10–47)
AST: 22 U/L (ref 11–38)
Albumin: 3.5 g/dL (ref 3.3–5.5)
Alkaline Phosphatase: 149 U/L — ABNORMAL HIGH (ref 26–84)
BUN, Bld: 11 mg/dL (ref 7–22)
CO2: 26 meq/L (ref 18–33)
Calcium: 8.6 mg/dL (ref 8.0–10.3)
Chloride: 103 mEq/L (ref 98–108)
Creat: 0.5 mg/dl — ABNORMAL LOW (ref 0.6–1.2)
GLUCOSE: 94 mg/dL (ref 73–118)
Potassium: 3.8 mEq/L (ref 3.3–4.7)
SODIUM: 137 meq/L (ref 128–145)
Total Bilirubin: 0.6 mg/dl (ref 0.20–1.60)
Total Protein: 6.9 g/dL (ref 6.4–8.1)

## 2015-05-20 LAB — URINALYSIS, MICROSCOPIC (CHCC SATELLITE)
BLOOD: NEGATIVE
Bilirubin (Urine): NEGATIVE
GLUCOSE UR: NEGATIVE mg/dL
Ketones: NEGATIVE mg/dL
NITRITE: POSITIVE
PROTEIN: NEGATIVE mg/dL
SPECIFIC GRAVITY, URINE: 1.005 (ref 1.003–1.035)
UROBILINOGEN UR: 0.2 mg/dL (ref 0.2–1)
pH: 6.5 (ref 4.60–8.00)

## 2015-05-20 LAB — UA PROTEIN, DIPSTICK - CHCC SATELLITE: Protein, Urine: NEGATIVE mg/dL

## 2015-05-20 MED ORDER — DENOSUMAB 120 MG/1.7ML ~~LOC~~ SOLN
120.0000 mg | Freq: Once | SUBCUTANEOUS | Status: AC
  Administered 2015-05-20: 120 mg via SUBCUTANEOUS
  Filled 2015-05-20: qty 1.7

## 2015-05-20 MED ORDER — SULFAMETHOXAZOLE-TRIMETHOPRIM 800-160 MG PO TABS: 1.0000 | ORAL_TABLET | Freq: Two times a day (BID) | ORAL | Status: DC

## 2015-05-20 MED ORDER — PROCHLORPERAZINE MALEATE 10 MG PO TABS
10.0000 mg | ORAL_TABLET | Freq: Once | ORAL | Status: AC
  Administered 2015-05-20: 10 mg via ORAL

## 2015-05-20 MED ORDER — SODIUM CHLORIDE 0.9% FLUSH
10.0000 mL | INTRAVENOUS | Status: DC | PRN
  Administered 2015-05-20: 10 mL
  Filled 2015-05-20: qty 10

## 2015-05-20 MED ORDER — PROCHLORPERAZINE MALEATE 10 MG PO TABS
ORAL_TABLET | ORAL | Status: AC
  Filled 2015-05-20: qty 1

## 2015-05-20 MED ORDER — HEPARIN SOD (PORK) LOCK FLUSH 100 UNIT/ML IV SOLN
500.0000 [IU] | Freq: Once | INTRAVENOUS | Status: AC | PRN
  Administered 2015-05-20: 500 [IU]
  Filled 2015-05-20: qty 5

## 2015-05-20 MED ORDER — SODIUM CHLORIDE 0.9 % IV SOLN
Freq: Once | INTRAVENOUS | Status: AC
  Administered 2015-05-20: 10:00:00 via INTRAVENOUS

## 2015-05-20 MED ORDER — VINORELBINE TARTRATE CHEMO INJECTION 50 MG/5ML
26.0000 mg/m2 | Freq: Once | INTRAVENOUS | Status: AC
  Administered 2015-05-20: 40 mg via INTRAVENOUS
  Filled 2015-05-20: qty 4

## 2015-05-20 MED ORDER — SODIUM CHLORIDE 0.9 % IV SOLN
10.5000 mg/kg | Freq: Once | INTRAVENOUS | Status: AC
  Administered 2015-05-20: 575 mg via INTRAVENOUS
  Filled 2015-05-20: qty 16

## 2015-05-20 NOTE — Patient Instructions (Addendum)
Vinorelbine injection What is this medicine? VINORELBINE (vi NOR el been) is a chemotherapy drug. It targets fast dividing cells, like cancer cells, and causes these cells to die. This medicine is used to treat cancer, like lung cancer. This medicine may be used for other purposes; ask your health care provider or pharmacist if you have questions. What should I tell my health care provider before I take this medicine? They need to know if you have any of these conditions: -blood disorders -infection (especially chickenpox and herpes) -liver disease -lung disease -nervous system disease -previous or current radiation therapy -an unusual or allergic reaction to vinorelbine, other chemotherapy agents, other medicines, foods, dyes, or preservatives -pregnant or trying to get pregnant -breast-feeding How should I use this medicine? This drug is given as an infusion into a vein. It is administered in a hospital or clinic by a specially trained health care professional. If you have pain, swelling, burning or any unusual feeling around the site of your injection, tell your health care professional right away. Talk to your pediatrician regarding the use of this medicine in children. Special care may be needed. Overdosage: If you think you have taken too much of this medicine contact a poison control center or emergency room at once. NOTE: This medicine is only for you. Do not share this medicine with others. What if I miss a dose? It is important not to miss your dose. Call your doctor or health care professional if you are unable to keep an appointment. What may interact with this medicine? Do not take this medicine with any of the following medications: -itraconazole -voriconazole This medicine may also interact with the following medications: -cyclosporine -erythromycin -fluconazole -ketoconazole -medicines for HIV like delavirdine, efavirenz, nevirapine -medicines for seizures like  ethotoin, fosphenotoin, phenytoin -medicines to increase blood counts like filgrastim, pegfilgrastim, sargramostim -other chemotherapy drugs like cisplatin, mitomycin, paclitaxel -vaccines Talk to your doctor or health care professional before taking any of these medicines: -acetaminophen -aspirin -ibuprofen -ketoprofen -naproxen This list may not describe all possible interactions. Give your health care provider a list of all the medicines, herbs, non-prescription drugs, or dietary supplements you use. Also tell them if you smoke, drink alcohol, or use illegal drugs. Some items may interact with your medicine. What should I watch for while using this medicine? Your condition will be monitored carefully while you are receiving this medicine. You will need important blood work done while you are taking this medicine. This drug may make you feel generally unwell. This is not uncommon, as chemotherapy can affect healthy cells as well as cancer cells. Report any side effects. Continue your course of treatment even though you feel ill unless your doctor tells you to stop. In some cases, you may be given additional medicines to help with side effects. Follow all directions for their use. Call your doctor or health care professional for advice if you get a fever, chills or sore throat, or other symptoms of a cold or flu. Do not treat yourself. This drug decreases your body's ability to fight infections. Try to avoid being around people who are sick. This medicine may increase your risk to bruise or bleed. Call your doctor or health care professional if you notice any unusual bleeding. Be careful brushing and flossing your teeth or using a toothpick because you may get an infection or bleed more easily. If you have any dental work done, tell your dentist you are receiving this medicine. Avoid taking products that contain aspirin,  acetaminophen, ibuprofen, naproxen, or ketoprofen unless instructed by your  doctor. These medicines may hide a fever. Do not become pregnant while taking this medicine. Women should inform their doctor if they wish to become pregnant or think they might be pregnant. There is a potential for serious side effects to an unborn child. Talk to your health care professional or pharmacist for more information. Do not breast-feed an infant while taking this medicine. Men must use a latex condom during sexual contact with a woman while taking this medicine and for 3 months after you stop taking this medicine. A latex condom is needed even if you have had a vasectomy. Contact your doctor right away if your partner becomes pregnant. Do not donate sperm while taking this medicine and for 3 months after you stop taking this medicine. Men should inform their doctors if they wish to father a child. This medicine may lower sperm counts. What side effects may I notice from receiving this medicine? Side effects that you should report to your doctor or health care professional as soon as possible: -allergic reactions like skin rash, itching or hives, swelling of the face, lips, or tongue -low blood counts - This drug may decrease the number of white blood cells, red blood cells and platelets. You may be at increased risk for infections and bleeding. -signs of infection - fever or chills, cough, sore throat, pain or difficulty passing urine -signs of decreased platelets or bleeding - bruising, pinpoint red spots on the skin, black, tarry stools, nosebleeds -signs of decreased red blood cells - unusually weak or tired, fainting spells, lightheadedness -breathing problems -chest pain -constipation -cough -mouth sores -nausea and vomiting -pain, swelling, redness or irritation at the injection site -pain, tingling, numbness in the hands or feet -stomach pain -trouble passing urine or change in the amount of urine Side effects that usually do not require medical attention (report to your doctor  or health care professional if they continue or are bothersome): -diarrhea -hair loss -jaw pain -loss of appetite This list may not describe all possible side effects. Call your doctor for medical advice about side effects. You may report side effects to FDA at 1-800-FDA-1088. Where should I keep my medicine? This drug is given in a hospital or clinic and will not be stored at home. NOTE: This sheet is a summary. It may not cover all possible information. If you have questions about this medicine, talk to your doctor, pharmacist, or health care provider.    2016, Elsevier/Gold Standard. (2013-07-24 11:37:59) Bevacizumab injection What is this medicine? BEVACIZUMAB (be va SIZ yoo mab) is a monoclonal antibody. It is used to treat cervical cancer, colorectal cancer, glioblastoma multiforme, non-small cell lung cancer (NSCLC), ovarian cancer, and renal cell cancer. This medicine may be used for other purposes; ask your health care provider or pharmacist if you have questions. What should I tell my health care provider before I take this medicine? They need to know if you have any of these conditions: -blood clots -heart disease, including heart failure, heart attack, or chest pain (angina) -high blood pressure -infection (especially a virus infection such as chickenpox, cold sores, or herpes) -kidney disease -lung disease -prior chemotherapy with doxorubicin, daunorubicin, epirubicin, or other anthracycline type chemotherapy agents -recent or ongoing radiation therapy -recent surgery -stroke -an unusual or allergic reaction to bevacizumab, hamster proteins, mouse proteins, other medicines, foods, dyes, or preservatives -pregnant or trying to get pregnant -breast-feeding How should I use this medicine? This medicine is  for infusion into a vein. It is given by a health care professional in a hospital or clinic setting. Talk to your pediatrician regarding the use of this medicine in  children. Special care may be needed. Overdosage: If you think you have taken too much of this medicine contact a poison control center or emergency room at once. NOTE: This medicine is only for you. Do not share this medicine with others. What if I miss a dose? It is important not to miss your dose. Call your doctor or health care professional if you are unable to keep an appointment. What may interact with this medicine? Interactions are not expected. This list may not describe all possible interactions. Give your health care provider a list of all the medicines, herbs, non-prescription drugs, or dietary supplements you use. Also tell them if you smoke, drink alcohol, or use illegal drugs. Some items may interact with your medicine. What should I watch for while using this medicine? Your condition will be monitored carefully while you are receiving this medicine. You will need important blood work and urine testing done while you are taking this medicine. During your treatment, let your health care professional know if you have any unusual symptoms, such as difficulty breathing. This medicine may rarely cause 'gastrointestinal perforation' (holes in the stomach, intestines or colon), a serious side effect requiring surgery to repair. This medicine should be started at least 28 days following major surgery and the site of the surgery should be totally healed. Check with your doctor before scheduling dental work or surgery while you are receiving this treatment. Talk to your doctor if you have recently had surgery or if you have a wound that has not healed. Do not become pregnant while taking this medicine or for 6 months after stopping it. Women should inform their doctor if they wish to become pregnant or think they might be pregnant. There is a potential for serious side effects to an unborn child. Talk to your health care professional or pharmacist for more information. Do not breast-feed an infant  while taking this medicine. This medicine has caused ovarian failure in some women. This medicine may interfere with the ability to have a child. You should talk to your doctor or health care professional if you are concerned about your fertility. What side effects may I notice from receiving this medicine? Side effects that you should report to your doctor or health care professional as soon as possible: -allergic reactions like skin rash, itching or hives, swelling of the face, lips, or tongue -signs of infection - fever or chills, cough, sore throat, pain or trouble passing urine -signs of decreased platelets or bleeding - bruising, pinpoint red spots on the skin, black, tarry stools, nosebleeds, blood in the urine -breathing problems -changes in vision -chest pain -confusion -jaw pain, especially after dental work -mouth sores -seizures -severe abdominal pain -severe headache -sudden numbness or weakness of the face, arm or leg -swelling of legs or ankles -symptoms of a stroke: change in mental awareness, inability to talk or move one side of the body (especially in patients with lung cancer) -trouble passing urine or change in the amount of urine -trouble speaking or understanding -trouble walking, dizziness, loss of balance or coordination Side effects that usually do not require medical attention (report to your doctor or health care professional if they continue or are bothersome): -constipation -diarrhea -dry skin -headache -loss of appetite -nausea, vomiting This list may not describe all possible side effects.  Call your doctor for medical advice about side effects. You may report side effects to FDA at 1-800-FDA-1088. Where should I keep my medicine? This drug is given in a hospital or clinic and will not be stored at home. NOTE: This sheet is a summary. It may not cover all possible information. If you have questions about this medicine, talk to your doctor, pharmacist, or  health care provider.    2016, Elsevier/Gold Standard. (2014-03-25 16:58:44) Denosumab injection What is this medicine? DENOSUMAB (den oh sue mab) slows bone breakdown. Prolia is used to treat osteoporosis in women after menopause and in men. Delton See is used to prevent bone fractures and other bone problems caused by cancer bone metastases. Delton See is also used to treat giant cell tumor of the bone. This medicine may be used for other purposes; ask your health care provider or pharmacist if you have questions. What should I tell my health care provider before I take this medicine? They need to know if you have any of these conditions: -dental disease -eczema -infection or history of infections -kidney disease or on dialysis -low blood calcium or vitamin D -malabsorption syndrome -scheduled to have surgery or tooth extraction -taking medicine that contains denosumab -thyroid or parathyroid disease -an unusual reaction to denosumab, other medicines, foods, dyes, or preservatives -pregnant or trying to get pregnant -breast-feeding How should I use this medicine? This medicine is for injection under the skin. It is given by a health care professional in a hospital or clinic setting. If you are getting Prolia, a special MedGuide will be given to you by the pharmacist with each prescription and refill. Be sure to read this information carefully each time. For Prolia, talk to your pediatrician regarding the use of this medicine in children. Special care may be needed. For Delton See, talk to your pediatrician regarding the use of this medicine in children. While this drug may be prescribed for children as young as 13 years for selected conditions, precautions do apply. Overdosage: If you think you have taken too much of this medicine contact a poison control center or emergency room at once. NOTE: This medicine is only for you. Do not share this medicine with others. What if I miss a dose? It is  important not to miss your dose. Call your doctor or health care professional if you are unable to keep an appointment. What may interact with this medicine? Do not take this medicine with any of the following medications: -other medicines containing denosumab This medicine may also interact with the following medications: -medicines that suppress the immune system -medicines that treat cancer -steroid medicines like prednisone or cortisone This list may not describe all possible interactions. Give your health care provider a list of all the medicines, herbs, non-prescription drugs, or dietary supplements you use. Also tell them if you smoke, drink alcohol, or use illegal drugs. Some items may interact with your medicine. What should I watch for while using this medicine? Visit your doctor or health care professional for regular checks on your progress. Your doctor or health care professional may order blood tests and other tests to see how you are doing. Call your doctor or health care professional if you get a cold or other infection while receiving this medicine. Do not treat yourself. This medicine may decrease your body's ability to fight infection. You should make sure you get enough calcium and vitamin D while you are taking this medicine, unless your doctor tells you not to. Discuss the foods  you eat and the vitamins you take with your health care professional. See your dentist regularly. Brush and floss your teeth as directed. Before you have any dental work done, tell your dentist you are receiving this medicine. Do not become pregnant while taking this medicine or for 5 months after stopping it. Women should inform their doctor if they wish to become pregnant or think they might be pregnant. There is a potential for serious side effects to an unborn child. Talk to your health care professional or pharmacist for more information. What side effects may I notice from receiving this  medicine? Side effects that you should report to your doctor or health care professional as soon as possible: -allergic reactions like skin rash, itching or hives, swelling of the face, lips, or tongue -breathing problems -chest pain -fast, irregular heartbeat -feeling faint or lightheaded, falls -fever, chills, or any other sign of infection -muscle spasms, tightening, or twitches -numbness or tingling -skin blisters or bumps, or is dry, peels, or red -slow healing or unexplained pain in the mouth or jaw -unusual bleeding or bruising Side effects that usually do not require medical attention (Report these to your doctor or health care professional if they continue or are bothersome.): -muscle pain -stomach upset, gas This list may not describe all possible side effects. Call your doctor for medical advice about side effects. You may report side effects to FDA at 1-800-FDA-1088. Where should I keep my medicine? This medicine is only given in a clinic, doctor's office, or other health care setting and will not be stored at home. NOTE: This sheet is a summary. It may not cover all possible information. If you have questions about this medicine, talk to your doctor, pharmacist, or health care provider.    2016, Elsevier/Gold Standard. (2011-07-25 12:37:47)

## 2015-05-22 LAB — URINE CULTURE

## 2015-05-26 ENCOUNTER — Encounter: Payer: Self-pay | Admitting: Hematology & Oncology

## 2015-06-03 ENCOUNTER — Ambulatory Visit (HOSPITAL_BASED_OUTPATIENT_CLINIC_OR_DEPARTMENT_OTHER): Payer: PPO | Admitting: Hematology & Oncology

## 2015-06-03 ENCOUNTER — Other Ambulatory Visit (HOSPITAL_BASED_OUTPATIENT_CLINIC_OR_DEPARTMENT_OTHER): Payer: PPO

## 2015-06-03 ENCOUNTER — Ambulatory Visit (HOSPITAL_BASED_OUTPATIENT_CLINIC_OR_DEPARTMENT_OTHER): Payer: PPO

## 2015-06-03 VITALS — BP 117/86 | HR 79 | Temp 98.0°F | Resp 20 | Wt 127.0 lb

## 2015-06-03 DIAGNOSIS — C3490 Malignant neoplasm of unspecified part of unspecified bronchus or lung: Secondary | ICD-10-CM

## 2015-06-03 DIAGNOSIS — Z5112 Encounter for antineoplastic immunotherapy: Secondary | ICD-10-CM | POA: Diagnosis not present

## 2015-06-03 DIAGNOSIS — Z79899 Other long term (current) drug therapy: Secondary | ICD-10-CM

## 2015-06-03 DIAGNOSIS — Z5111 Encounter for antineoplastic chemotherapy: Secondary | ICD-10-CM

## 2015-06-03 DIAGNOSIS — C7951 Secondary malignant neoplasm of bone: Secondary | ICD-10-CM | POA: Diagnosis not present

## 2015-06-03 DIAGNOSIS — M792 Neuralgia and neuritis, unspecified: Secondary | ICD-10-CM

## 2015-06-03 DIAGNOSIS — C3491 Malignant neoplasm of unspecified part of right bronchus or lung: Secondary | ICD-10-CM

## 2015-06-03 DIAGNOSIS — R0600 Dyspnea, unspecified: Secondary | ICD-10-CM

## 2015-06-03 DIAGNOSIS — J910 Malignant pleural effusion: Secondary | ICD-10-CM

## 2015-06-03 DIAGNOSIS — C779 Secondary and unspecified malignant neoplasm of lymph node, unspecified: Secondary | ICD-10-CM | POA: Diagnosis not present

## 2015-06-03 DIAGNOSIS — C3492 Malignant neoplasm of unspecified part of left bronchus or lung: Secondary | ICD-10-CM

## 2015-06-03 DIAGNOSIS — R300 Dysuria: Secondary | ICD-10-CM

## 2015-06-03 DIAGNOSIS — C3430 Malignant neoplasm of lower lobe, unspecified bronchus or lung: Secondary | ICD-10-CM | POA: Diagnosis not present

## 2015-06-03 LAB — CBC WITH DIFFERENTIAL (CANCER CENTER ONLY)
BASO#: 0.1 10*3/uL (ref 0.0–0.2)
BASO%: 1 % (ref 0.0–2.0)
EOS ABS: 0.2 10*3/uL (ref 0.0–0.5)
EOS%: 3.2 % (ref 0.0–7.0)
HCT: 35.5 % (ref 34.8–46.6)
HGB: 11.7 g/dL (ref 11.6–15.9)
LYMPH#: 2 10*3/uL (ref 0.9–3.3)
LYMPH%: 31.6 % (ref 14.0–48.0)
MCH: 27.9 pg (ref 26.0–34.0)
MCHC: 33 g/dL (ref 32.0–36.0)
MCV: 85 fL (ref 81–101)
MONO#: 0.8 10*3/uL (ref 0.1–0.9)
MONO%: 12.4 % (ref 0.0–13.0)
NEUT#: 3.2 10*3/uL (ref 1.5–6.5)
NEUT%: 51.8 % (ref 39.6–80.0)
PLATELETS: 286 10*3/uL (ref 145–400)
RBC: 4.19 10*6/uL (ref 3.70–5.32)
RDW: 16.1 % — AB (ref 11.1–15.7)
WBC: 6.2 10*3/uL (ref 3.9–10.0)

## 2015-06-03 LAB — URINALYSIS, MICROSCOPIC (CHCC SATELLITE)
BLOOD: NEGATIVE
Bilirubin (Urine): NEGATIVE
GLUCOSE UR: NEGATIVE mg/dL
Ketones: NEGATIVE mg/dL
LEUKOCYTE ESTERASE: NEGATIVE
NITRITE: NEGATIVE
PH: 6 (ref 4.60–8.00)
Protein: NEGATIVE mg/dL
SPECIFIC GRAVITY, URINE: 1.02 (ref 1.003–1.035)
UROBILINOGEN UR: 0.2 mg/dL (ref 0.2–1)

## 2015-06-03 LAB — CMP (CANCER CENTER ONLY)
ALT(SGPT): 17 U/L (ref 10–47)
AST: 21 U/L (ref 11–38)
Albumin: 3.7 g/dL (ref 3.3–5.5)
Alkaline Phosphatase: 142 U/L — ABNORMAL HIGH (ref 26–84)
BUN: 12 mg/dL (ref 7–22)
CHLORIDE: 104 meq/L (ref 98–108)
CO2: 25 meq/L (ref 18–33)
CREATININE: 0.7 mg/dL (ref 0.6–1.2)
Calcium: 8.2 mg/dL (ref 8.0–10.3)
GLUCOSE: 100 mg/dL (ref 73–118)
POTASSIUM: 3.7 meq/L (ref 3.3–4.7)
SODIUM: 138 meq/L (ref 128–145)
TOTAL PROTEIN: 6.9 g/dL (ref 6.4–8.1)
Total Bilirubin: 0.6 mg/dl (ref 0.20–1.60)

## 2015-06-03 MED ORDER — HEPARIN SOD (PORK) LOCK FLUSH 100 UNIT/ML IV SOLN
500.0000 [IU] | Freq: Once | INTRAVENOUS | Status: AC | PRN
  Administered 2015-06-03: 500 [IU]
  Filled 2015-06-03: qty 5

## 2015-06-03 MED ORDER — PROCHLORPERAZINE MALEATE 10 MG PO TABS
ORAL_TABLET | ORAL | Status: AC
  Filled 2015-06-03: qty 1

## 2015-06-03 MED ORDER — SODIUM CHLORIDE 0.9 % IV SOLN
10.0000 mg/kg | Freq: Once | INTRAVENOUS | Status: AC
  Administered 2015-06-03: 575 mg via INTRAVENOUS
  Filled 2015-06-03: qty 16

## 2015-06-03 MED ORDER — VINORELBINE TARTRATE CHEMO INJECTION 50 MG/5ML
26.0000 mg/m2 | Freq: Once | INTRAVENOUS | Status: AC
  Administered 2015-06-03: 40 mg via INTRAVENOUS
  Filled 2015-06-03: qty 4

## 2015-06-03 MED ORDER — PROCHLORPERAZINE MALEATE 10 MG PO TABS
10.0000 mg | ORAL_TABLET | Freq: Once | ORAL | Status: AC
  Administered 2015-06-03: 10 mg via ORAL

## 2015-06-03 MED ORDER — SODIUM CHLORIDE 0.9 % IV SOLN
Freq: Once | INTRAVENOUS | Status: AC
  Administered 2015-06-03: 15:00:00 via INTRAVENOUS

## 2015-06-03 MED ORDER — SODIUM CHLORIDE 0.9% FLUSH
10.0000 mL | INTRAVENOUS | Status: DC | PRN
  Administered 2015-06-03: 10 mL
  Filled 2015-06-03: qty 10

## 2015-06-03 NOTE — Patient Instructions (Addendum)
[facility] Discharge Instructions for Patients Receiving Chemotherapy  Today you received the following chemotherapy agents Navelbine and Avastin.  To help prevent nausea and vomiting after your treatment, we encourage you to take your nausea medication    If you develop nausea and vomiting that is not controlled by your nausea medication, call the clinic.   BELOW ARE SYMPTOMS THAT SHOULD BE REPORTED IMMEDIATELY:  *FEVER GREATER THAN 100.5 F  *CHILLS WITH OR WITHOUT FEVER  NAUSEA AND VOMITING THAT IS NOT CONTROLLED WITH YOUR NAUSEA MEDICATION  *UNUSUAL SHORTNESS OF BREATH  *UNUSUAL BRUISING OR BLEEDING  TENDERNESS IN MOUTH AND THROAT WITH OR WITHOUT PRESENCE OF ULCERS  *URINARY PROBLEMS  *BOWEL PROBLEMS  UNUSUAL RASH Items with * indicate a potential emergency and should be followed up as soon as possible.  Feel free to call the clinic you have any questions or concerns. The clinic phone number is [phone].  Please show the [facility] at check-in to the Emergency Department and triage nurse.

## 2015-06-03 NOTE — Progress Notes (Signed)
Hematology and Oncology Follow Up Visit  [name] [id number] 28-Apr-1950 65 y.o. 06/03/2015   Principle Diagnosis:  Metastatic adenocarcinoma the lung - bone metastases, lymph node metastasis and intrapulmonary metastasis Malignant left pleural effusion  Current Therapy:  Navelbine/Avastin q 2wk dosing - s/p c#1 Tecentriq s/p cycle 4 Xgeva 120 mg subcutaneous every month    Interim History:  [name] is here today with her husband and family for follow-up.    Unfortunately, she is having some radicular pain down her left arm. It sounds like this might be coming from her neck. She does have some bulging disks in her neck. I think we will have to definitely consider an MRI to see if there is any tumor that might be causing this. If so, then I would try to get radiation for her if possible.   Thankfully, she's tolerated chemotherapy quite well. She really has had very little side effects from the novel bean.   She's had no cough. There's no shortness of breath. I don't think her pleural effusion has come back.  She's had no nausea or vomiting. She's gained a couple pounds.  His been no leg swelling. She's had no joint issues.  Overall, I said that her performance status is ECOG 1.  Medications:    Medication List       This list is accurate as of: 06/03/15  3:29 PM.  Always use your most recent med list.               aspirin 81 MG tablet  Take 81 mg by mouth daily.     calcium-vitamin D 250-125 MG-UNIT tablet  Commonly known as:  OSCAL  Take 1 tablet by mouth daily.     ECHINACEA C COMPLETE PO  Take 1 tablet by mouth daily.     fish oil-omega-3 fatty acids 1000 MG capsule  Take 2 g by mouth daily.     folic acid 1 MG tablet  Commonly known as:  FOLVITE  Take 1 mg by mouth daily.     Ipratropium-Albuterol 20-100 MCG/ACT Aers respimat  Commonly known as:  COMBIVENT  Inhale 1 puff into the lungs every 6 (six) hours as needed for wheezing.     KLOR-CON M20  20 MEQ tablet  Generic drug:  potassium chloride SA  TAKE 1 TABLET BY MOUTH DAILY     meloxicam 15 MG tablet  Commonly known as:  MOBIC  TAKE 1 TABLET (15 MG TOTAL) BY MOUTH DAILY.     omeprazole 40 MG capsule  Commonly known as:  PRILOSEC  Take 40 mg by mouth 2 (two) times daily.     oxyCODONE 5 MG immediate release tablet  Commonly known as:  Oxy IR/ROXICODONE  Take 1-2 pills, if needed, every 4 hours for pain secondary to cancer.     PROAIR RESPICLICK 242 (90 Base) MCG/ACT Aepb  Generic drug:  Albuterol Sulfate  USE 2 PUFFS EVERY 4-6 HOURS     prochlorperazine 10 MG tablet  Commonly known as:  COMPAZINE  Take 10 mg by mouth every 6 (six) hours as needed for nausea or vomiting.     SUMAtriptan 100 MG tablet  Commonly known as:  IMITREX  Take 100 mg by mouth as needed for migraine or headache. Reported on 04/01/2015     Vitamin D 2000 units tablet  Take 2,000 Units by mouth daily.        Allergies:  Allergies  Allergen Reactions  . Demerol Other (  See Comments)    Syncope  . Mefoxin [Cefoxitin Sodium In Dextrose]     rash  . Topamax Other (See Comments)    Had trouble walking  . Avelox [Moxifloxacin Hcl In Nacl]     Hypotension.    Past Medical History, Surgical history, Social history, and Family History were reviewed and updated.  Review of Systems: All other 10 point review of systems is negative.   Physical Exam:  weight is 127 lb (57.607 kg). Her oral temperature is 98 F (36.7 C). Her blood pressure is 117/86 and her pulse is 79. Her respiration is 20.   Wt Readings from Last 3 Encounters:  06/03/15 127 lb (57.607 kg)  05/20/15 125 lb (56.7 kg)  04/22/15 121 lb 12 oz (55.225 kg)    Well-developed and well-nourished white female in no obvious distress. Head and neck exam shows no ocular or oral lesions. There are no palpable cervical or supraclavicular lymph nodes. Lungs are clear on the right side. There is still some slight decrease breath sounds on  the left side. Cardiac exam regular rate and rhythm with no murmurs, rubs or bruits. Abdomen is soft. She is good bowel sounds. There is no fluid wave. There is no palpable liver or spleen tip. Back exam shows no tenderness over the spine, ribs or hips. Extremities shows no clubbing, cyanosis or edema. Skin exam shows no rashes, ecchymoses or petechia. Neurological exam shows no focal neurological deficits.   Lab Results  Component Value Date   WBC 6.2 06/03/2015   HGB 11.7 06/03/2015   HCT 35.5 06/03/2015   MCV 85 06/03/2015   PLT 286 06/03/2015   Lab Results  Component Value Date   FERRITIN 47 10/29/2013   IRON 64 10/29/2013   TIBC 233* 10/29/2013   UIBC 169 10/29/2013   IRONPCTSAT 27 10/29/2013   Lab Results  Component Value Date   RETICCTPCT 1.4 08/13/2008   RBC 4.19 06/03/2015   RETICCTABS 61.6 08/13/2008   No results found for: KPAFRELGTCHN, LAMBDASER, KAPLAMBRATIO No results found for: IGGSERUM, IGA, IGMSERUM No results found for: Odetta Pink, SPEI   Chemistry      Component Value Date/Time   NA 138 06/03/2015 1303   NA 140 04/04/2015 1548   K 3.7 06/03/2015 1303   K 4.2 04/04/2015 1548   CL 104 06/03/2015 1303   CL 105 04/04/2015 1548   CO2 25 06/03/2015 1303   CO2 25 04/04/2015 1548   BUN 12 06/03/2015 1303   BUN 11 04/04/2015 1548   CREATININE 0.7 06/03/2015 1303   CREATININE 0.50 04/04/2015 1548      Component Value Date/Time   CALCIUM 8.2 06/03/2015 1303   CALCIUM 9.0 04/04/2015 1548   ALKPHOS 142* 06/03/2015 1303   ALKPHOS 75 01/11/2015 0944   AST 21 06/03/2015 1303   AST 18 01/11/2015 0944   ALT 17 06/03/2015 1303   ALT 10* 01/11/2015 0944   BILITOT 0.60 06/03/2015 1303   BILITOT 0.9 01/11/2015 0944     Impression and Plan: [name] is a pleasant 65 yo white female with metastatic adenocarcinoma of the lung. She has no mutations that we can target.  We will go ahead and get her set up with  an MRI of her cervical spine. I cannot find any weakness in the left arm. There may be some slight weakness in the shoulder. Again, if she has metastatic disease, then I would consider radiation therapy.   I'm  just glad that her quality of life is doing well right now.   We will plan to get her back in another 2 weeks. I will call her with the MRI results.   We will certainly pray hard for her.   Volanda Napoleon, MD 4/26/20173:29 PM

## 2015-06-04 LAB — PREALBUMIN: PREALBUMIN: 19 mg/dL (ref 10–36)

## 2015-06-04 LAB — LACTATE DEHYDROGENASE: LDH: 207 U/L (ref 125–245)

## 2015-06-06 ENCOUNTER — Ambulatory Visit (HOSPITAL_BASED_OUTPATIENT_CLINIC_OR_DEPARTMENT_OTHER)
Admission: RE | Admit: 2015-06-06 | Discharge: 2015-06-06 | Disposition: A | Discharge status: Home / Self Care | Payer: PPO | Source: Ambulatory Visit | Attending: Hematology & Oncology | Admitting: Hematology & Oncology

## 2015-06-06 DIAGNOSIS — M4802 Spinal stenosis, cervical region: Secondary | ICD-10-CM | POA: Diagnosis not present

## 2015-06-06 DIAGNOSIS — C7951 Secondary malignant neoplasm of bone: Secondary | ICD-10-CM | POA: Insufficient documentation

## 2015-06-06 DIAGNOSIS — M792 Neuralgia and neuritis, unspecified: Secondary | ICD-10-CM | POA: Diagnosis not present

## 2015-06-06 DIAGNOSIS — C3492 Malignant neoplasm of unspecified part of left bronchus or lung: Secondary | ICD-10-CM | POA: Diagnosis not present

## 2015-06-06 DIAGNOSIS — M5031 Other cervical disc degeneration,  high cervical region: Secondary | ICD-10-CM | POA: Insufficient documentation

## 2015-06-06 DIAGNOSIS — R300 Dysuria: Secondary | ICD-10-CM | POA: Insufficient documentation

## 2015-06-06 MED ORDER — GADOBENATE DIMEGLUMINE 529 MG/ML IV SOLN: 10.0000 mL | Freq: Once | INTRAVENOUS | Status: DC | PRN

## 2015-06-15 DIAGNOSIS — J329 Chronic sinusitis, unspecified: Secondary | ICD-10-CM | POA: Diagnosis not present

## 2015-06-15 DIAGNOSIS — B9689 Other specified bacterial agents as the cause of diseases classified elsewhere: Secondary | ICD-10-CM | POA: Diagnosis not present

## 2015-06-15 DIAGNOSIS — H6123 Impacted cerumen, bilateral: Secondary | ICD-10-CM | POA: Diagnosis not present

## 2015-06-16 ENCOUNTER — Telehealth: Payer: Self-pay | Admitting: *Deleted

## 2015-06-16 NOTE — Telephone Encounter (Signed)
Patient is scheduled for treatment tomorrow. She saw her PCP yesterday and was diagnosed with a sinus infection. She has no fever. She has been started on antibiotics. She wants to make sure she should still come in tomorrow for treatment. Spoke with Dr Marin Olp who wants patient to keep her scheduled appointment tomorrow. Patient understands and will be here.

## 2015-06-17 ENCOUNTER — Ambulatory Visit (HOSPITAL_BASED_OUTPATIENT_CLINIC_OR_DEPARTMENT_OTHER): Payer: PPO

## 2015-06-17 ENCOUNTER — Other Ambulatory Visit (HOSPITAL_BASED_OUTPATIENT_CLINIC_OR_DEPARTMENT_OTHER): Payer: PPO

## 2015-06-17 VITALS — BP 129/87 | HR 87 | Temp 98.5°F | Resp 20 | Wt 122.1 lb

## 2015-06-17 DIAGNOSIS — C3490 Malignant neoplasm of unspecified part of unspecified bronchus or lung: Secondary | ICD-10-CM

## 2015-06-17 DIAGNOSIS — M792 Neuralgia and neuritis, unspecified: Secondary | ICD-10-CM

## 2015-06-17 DIAGNOSIS — Z5112 Encounter for antineoplastic immunotherapy: Secondary | ICD-10-CM

## 2015-06-17 DIAGNOSIS — C3492 Malignant neoplasm of unspecified part of left bronchus or lung: Secondary | ICD-10-CM

## 2015-06-17 DIAGNOSIS — J910 Malignant pleural effusion: Secondary | ICD-10-CM

## 2015-06-17 DIAGNOSIS — Z5111 Encounter for antineoplastic chemotherapy: Secondary | ICD-10-CM | POA: Diagnosis not present

## 2015-06-17 DIAGNOSIS — C7951 Secondary malignant neoplasm of bone: Secondary | ICD-10-CM

## 2015-06-17 DIAGNOSIS — C3491 Malignant neoplasm of unspecified part of right bronchus or lung: Secondary | ICD-10-CM

## 2015-06-17 DIAGNOSIS — C342 Malignant neoplasm of middle lobe, bronchus or lung: Secondary | ICD-10-CM

## 2015-06-17 DIAGNOSIS — R300 Dysuria: Secondary | ICD-10-CM

## 2015-06-17 LAB — CMP (CANCER CENTER ONLY)
ALBUMIN: 3.5 g/dL (ref 3.3–5.5)
ALT(SGPT): 14 U/L (ref 10–47)
AST: 19 U/L (ref 11–38)
Alkaline Phosphatase: 133 U/L — ABNORMAL HIGH (ref 26–84)
BILIRUBIN TOTAL: 0.7 mg/dL (ref 0.20–1.60)
BUN, Bld: 12 mg/dL (ref 7–22)
CO2: 28 meq/L (ref 18–33)
Calcium: 8.3 mg/dL (ref 8.0–10.3)
Chloride: 104 mEq/L (ref 98–108)
Creat: 0.8 mg/dl (ref 0.6–1.2)
Glucose, Bld: 93 mg/dL (ref 73–118)
Potassium: 3.8 mEq/L (ref 3.3–4.7)
SODIUM: 138 meq/L (ref 128–145)
Total Protein: 7 g/dL (ref 6.4–8.1)

## 2015-06-17 LAB — CBC WITH DIFFERENTIAL (CANCER CENTER ONLY)
BASO#: 0.1 10*3/uL (ref 0.0–0.2)
BASO%: 1.5 % (ref 0.0–2.0)
EOS%: 2.7 % (ref 0.0–7.0)
Eosinophils Absolute: 0.2 10*3/uL (ref 0.0–0.5)
HCT: 36.7 % (ref 34.8–46.6)
HEMOGLOBIN: 12 g/dL (ref 11.6–15.9)
LYMPH#: 2.1 10*3/uL (ref 0.9–3.3)
LYMPH%: 31.2 % (ref 14.0–48.0)
MCH: 27.9 pg (ref 26.0–34.0)
MCHC: 32.7 g/dL (ref 32.0–36.0)
MCV: 85 fL (ref 81–101)
MONO#: 0.8 10*3/uL (ref 0.1–0.9)
MONO%: 11.5 % (ref 0.0–13.0)
NEUT%: 53.1 % (ref 39.6–80.0)
NEUTROS ABS: 3.5 10*3/uL (ref 1.5–6.5)
PLATELETS: 260 10*3/uL (ref 145–400)
RBC: 4.3 10*6/uL (ref 3.70–5.32)
RDW: 16.2 % — ABNORMAL HIGH (ref 11.1–15.7)
WBC: 6.7 10*3/uL (ref 3.9–10.0)

## 2015-06-17 MED ORDER — SODIUM CHLORIDE 0.9 % IV SOLN: Freq: Once | INTRAVENOUS | Status: DC

## 2015-06-17 MED ORDER — PROCHLORPERAZINE MALEATE 10 MG PO TABS
10.0000 mg | ORAL_TABLET | Freq: Once | ORAL | Status: AC
  Administered 2015-06-17: 10 mg via ORAL

## 2015-06-17 MED ORDER — SODIUM CHLORIDE 0.9 % IV SOLN
Freq: Once | INTRAVENOUS | Status: AC
  Administered 2015-06-17: 13:00:00 via INTRAVENOUS

## 2015-06-17 MED ORDER — SODIUM CHLORIDE 0.9% FLUSH
10.0000 mL | INTRAVENOUS | Status: DC | PRN
  Filled 2015-06-17: qty 10

## 2015-06-17 MED ORDER — VINORELBINE TARTRATE CHEMO INJECTION 50 MG/5ML
26.0000 mg/m2 | Freq: Once | INTRAVENOUS | Status: AC
  Administered 2015-06-17: 40 mg via INTRAVENOUS
  Filled 2015-06-17: qty 4

## 2015-06-17 MED ORDER — PROCHLORPERAZINE MALEATE 10 MG PO TABS
ORAL_TABLET | ORAL | Status: AC
  Filled 2015-06-17: qty 1

## 2015-06-17 MED ORDER — SODIUM CHLORIDE 0.9% FLUSH
10.0000 mL | INTRAVENOUS | Status: DC | PRN
  Administered 2015-06-17: 10 mL
  Filled 2015-06-17: qty 10

## 2015-06-17 MED ORDER — BEVACIZUMAB CHEMO INJECTION 400 MG/16ML
10.0000 mg/kg | Freq: Once | INTRAVENOUS | Status: AC
  Administered 2015-06-17: 575 mg via INTRAVENOUS
  Filled 2015-06-17: qty 19

## 2015-06-17 MED ORDER — DENOSUMAB 120 MG/1.7ML ~~LOC~~ SOLN
120.0000 mg | Freq: Once | SUBCUTANEOUS | Status: AC
  Administered 2015-06-17: 120 mg via SUBCUTANEOUS
  Filled 2015-06-17: qty 1.7

## 2015-06-17 MED ORDER — HEPARIN SOD (PORK) LOCK FLUSH 100 UNIT/ML IV SOLN
500.0000 [IU] | Freq: Once | INTRAVENOUS | Status: AC | PRN
  Administered 2015-06-17: 500 [IU]
  Filled 2015-06-17: qty 5

## 2015-06-17 MED ORDER — HEPARIN SOD (PORK) LOCK FLUSH 100 UNIT/ML IV SOLN
500.0000 [IU] | Freq: Once | INTRAVENOUS | Status: DC | PRN
  Filled 2015-06-17: qty 5

## 2015-06-17 NOTE — Patient Instructions (Signed)
[facility] Discharge Instructions for Patients Receiving Chemotherapy  Today you received the following chemotherapy agents Navelbine and Avastin and Xgeva.   To help prevent nausea and vomiting after your treatment, we encourage you to take your nausea medication    If you develop nausea and vomiting that is not controlled by your nausea medication, call the clinic.   BELOW ARE SYMPTOMS THAT SHOULD BE REPORTED IMMEDIATELY:  *FEVER GREATER THAN 100.5 F  *CHILLS WITH OR WITHOUT FEVER  NAUSEA AND VOMITING THAT IS NOT CONTROLLED WITH YOUR NAUSEA MEDICATION  *UNUSUAL SHORTNESS OF BREATH  *UNUSUAL BRUISING OR BLEEDING  TENDERNESS IN MOUTH AND THROAT WITH OR WITHOUT PRESENCE OF ULCERS  *URINARY PROBLEMS  *BOWEL PROBLEMS  UNUSUAL RASH Items with * indicate a potential emergency and should be followed up as soon as possible.  Feel free to call the clinic you have any questions or concerns. The clinic phone number is [phone].  Please show the [facility] at check-in to the Emergency Department and triage nurse.

## 2015-06-23 ENCOUNTER — Other Ambulatory Visit: Payer: Self-pay | Admitting: *Deleted

## 2015-06-23 ENCOUNTER — Other Ambulatory Visit: Payer: Self-pay | Admitting: Hematology & Oncology

## 2015-06-23 ENCOUNTER — Telehealth: Payer: Self-pay | Admitting: *Deleted

## 2015-06-23 DIAGNOSIS — C3492 Malignant neoplasm of unspecified part of left bronchus or lung: Secondary | ICD-10-CM

## 2015-06-23 DIAGNOSIS — C3491 Malignant neoplasm of unspecified part of right bronchus or lung: Secondary | ICD-10-CM

## 2015-06-23 MED ORDER — OXYCODONE HCL ER 10 MG PO T12A: 10.0000 mg | EXTENDED_RELEASE_TABLET | Freq: Two times a day (BID) | ORAL | Status: DC

## 2015-06-23 NOTE — Telephone Encounter (Signed)
Patient calling the office c/o increased pain in her neck. She is taking oxycodone 2 tablets every 4 hours, but the pain continues. She also states that Dr Marin Olp had mentioned to her, referring her to Dr Sondra Come, but she hasn't heard anything yet.  Spoke to Dr Marin Olp and he will prescribe a long acting pain medication. He also states he spoke to Dr Sondra Come directly regarding this patient. A referral order to radiation oncology was placed today.

## 2015-06-24 NOTE — Progress Notes (Signed)
Histology and Location of Primary Cancer: Metastatic adenocarcinoma of the lung   Location(s) of Symptomatic Metastases: The MRI from 06/06/15 showed "widespread osseous metastases throughout the cervical and visualized upper thoracic spine as well as skullbase."  Past/Anticipated chemotherapy by medical oncology, if any: Navelbine/Avastin q 2wk dosing - s/p c#1, Tecentriq s/p cycle 4, Xgeva 120 mg subcutaneous every month  Pain on a scale of 0-10 is: 5 in her left shoulder, upper back and stomach.  She is taking oxycodone 10 mg q 12 hours.  If Spine Met(s), symptoms, if any, include:  Bowel/Bladder retention or incontinence (please describe): She denies having bladder issues, she was having constipation but took magnesium citrate yesterday and is now having diarrhea.  Numbness or weakness in extremities (please describe): no  Current Decadron regimen, if applicable: no  Ambulatory status? Walker? Wheelchair?: ambulatory but is using a wheelchair today due to feeling dizzy.  SAFETY ISSUES:  Prior radiation? Yes - 2008 and 2010 To right lung and near her esophagus at [facility]  Pacemaker/ICD? no  Possible current pregnancy? no  Is the patient on methotrexate? no  Current Complaints / other details:  Patient reports that she has 2 bulging discs in her neck.  The pain worsened about 3-4 weeks ago in her neck and left shoulder.  Patient is here with her husband.  BP 120/76 mmHg  Pulse 113  Temp(Src) 98.3 F (36.8 C) (Oral)  Ht _0  (1.549 m)  Wt 124 lb 9.6 oz (56.518 kg)  BMI 23.56 kg/m2  SpO2 99%   Wt Readings from Last 3 Encounters:  06/25/15 124 lb 9.6 oz (56.518 kg)  06/17/15 122 lb 1.3 oz (55.375 kg)  06/03/15 127 lb (57.607 kg)

## 2015-06-25 ENCOUNTER — Telehealth: Payer: Self-pay | Admitting: *Deleted

## 2015-06-25 ENCOUNTER — Ambulatory Visit
Admission: RE | Admit: 2015-06-25 | Discharge: 2015-06-25 | Disposition: A | Discharge status: Home / Self Care | Payer: PPO | Source: Ambulatory Visit | Attending: Radiation Oncology | Admitting: Radiation Oncology

## 2015-06-25 ENCOUNTER — Encounter: Payer: Self-pay | Admitting: Oncology

## 2015-06-25 ENCOUNTER — Encounter: Payer: Self-pay | Admitting: Radiation Oncology

## 2015-06-25 VITALS — BP 120/76 | HR 113 | Temp 98.3°F | Ht 61.0 in | Wt 124.6 lb

## 2015-06-25 DIAGNOSIS — C3411 Malignant neoplasm of upper lobe, right bronchus or lung: Secondary | ICD-10-CM | POA: Insufficient documentation

## 2015-06-25 DIAGNOSIS — C7951 Secondary malignant neoplasm of bone: Secondary | ICD-10-CM | POA: Diagnosis not present

## 2015-06-25 DIAGNOSIS — Z87891 Personal history of nicotine dependence: Secondary | ICD-10-CM | POA: Insufficient documentation

## 2015-06-25 DIAGNOSIS — Z510 Encounter for antineoplastic radiation therapy: Secondary | ICD-10-CM | POA: Insufficient documentation

## 2015-06-25 DIAGNOSIS — Z902 Acquired absence of lung [part of]: Secondary | ICD-10-CM | POA: Diagnosis not present

## 2015-06-25 DIAGNOSIS — Z90710 Acquired absence of both cervix and uterus: Secondary | ICD-10-CM | POA: Insufficient documentation

## 2015-06-25 NOTE — Progress Notes (Signed)
  Radiation Oncology         [phone] ________________________________  Name: [name] MRN: [id number]  Date: 06/25/2015  DOB: 1950/12/22  SIMULATION AND TREATMENT PLANNING NOTE    ICD-9-CM ICD-10-CM   1. Bone metastasis (HCC) 198.5 C79.51     DIAGNOSIS:  Metastatic adenocarcinoma of the lung  NARRATIVE:  The patient was brought to the [facility].  Identity was confirmed.  All relevant records and images related to the planned course of therapy were reviewed.  The patient freely provided informed written consent to proceed with treatment after reviewing the details related to the planned course of therapy. The consent form was witnessed and verified by the simulation staff.  Then, the patient was set-up in a stable reproducible  supine position for radiation therapy.  CT images were obtained.  Surface markings were placed.  The CT images were loaded into the planning software.  Then the target and avoidance structures were contoured.  Treatment planning then occurred.  The radiation prescription was entered and confirmed.  Then, I designed and supervised the construction of a total of 3 medically necessary complex treatment devices.  I have requested : 3D Simulation  I have requested a DVH of the following structures: larynx, spinal cord CTV.  I have ordered:dose calc.  PLAN:  The patient will receive 30 Gy in 10 fractions.  ________________________________  -----------------------------------  Blair Promise, PhD, MD

## 2015-06-25 NOTE — Progress Notes (Signed)
Radiation Oncology         [phone] ________________________________  Initial Outpatient Consultation  Name: [name] MRN: [id number]  Date: 06/25/2015  DOB: 28-Jan-1951  WU:[id number],NWGNFA A, MD  Myrlene Broker, MD   REFERRING PHYSICIAN: Myrlene Broker, MD  DIAGNOSIS: The encounter diagnosis was Bone metastasis ([place]).  Metastatic adenocarcinoma of the lung  HISTORY OF PRESENT ILLNESS::[name] is a 65 y.o. female who was initially diagnosed with Stage 1 NSCLC of the right lung in October 2005. At that time, she was treated with a right upper lobe lobectomy followed by adjuvant Carboplatin and Gemcitabine. She was later diagnosed with Stage 1  NSCLC of the left lung in December 2008 and was treated with a left total lobe resection followed by adjuvant Taxotere times 4 cycles. In April of 2010, she was diagnosed with right lower lobe Stage 1 NSCLC and was treated with radiation in the form of 50 Gy in 5 fractions using SBRT to the RLL. 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 She also underwent radiation to a left lower lobe lesion in 2013 in the form of 50 Gy in 5 fractions. A recent PET scan from 05/08/2015 revealed interval progression of multifocal hypermetabolic bone metastases as well as interval progression of metastatic adenopathy within the left hilum and left side. A soft tissue attenuating mass in the left lower lobe in the area of architectural distortion and scarring was again noted. This appeared to be slightly increased in size and degree of FDG uptake when compared with the previous exam from 03/09/2015. She has been undergoing chemotherapy treatment under the care of Dr. Marin Olp. Additionally, she reports a past medical history of a brain aneurism in 2002. She is here today to discuss the role of radiation in  the palliative management of her disease as she is experiencing a great amount of pain. She is accompanied by her husband today.   PREVIOUS RADIATION THERAPY: As detailed above  PAST MEDICAL HISTORY:  has a past medical history of Cancer [facility]) (2005 & 2008); Hypertension; Stroke [facility]); GERD (gastroesophageal reflux disease); B12 deficiency; Vitamin D deficiency; Lung cancer, primary, with metastasis from lung to other site [facility]) (04/01/2015); and Pleural effusion, malignant (04/01/2015).    PAST SURGICAL HISTORY: Past Surgical History  Procedure Laterality Date  . Vaginal hysterectomy  1983  . Cyst removed from ovary  1968  . Lung removal, partial  2005, 2008    R upper lobe and left upper lobe wedge  . Bronchoscopy    . Mediastinoscopy    . Cholecystectomy    . Appendectomy    . Hiatal hernia repair  2010    FAMILY HISTORY: family history includes Breast cancer in her sister; Congestive Heart Failure in her mother; Diabetes in her mother; Heart attack in her father; Hypertension in her mother. There is no history of Lung disease.  SOCIAL HISTORY:  reports that she quit smoking about 12 years ago. Her smoking use included Cigarettes. She started smoking about 47 years ago. She has a 66 pack-year smoking history. She has never used smokeless tobacco. She reports that she does not drink alcohol or use illicit drugs.  ALLERGIES: Demerol; Mefoxin; Topamax; and Avelox  MEDICATIONS:  Current Outpatient Prescriptions  Medication Sig Dispense Refill  . aspirin 81 MG tablet Take 81 mg by mouth daily.      Marland Kitchen  budesonide-formoterol (SYMBICORT) 160-4.5 MCG/ACT inhaler Inhale 2 puffs into the lungs 2 (two) times daily.    . calcium-vitamin D (OSCAL) 250-125 MG-UNIT per tablet Take 1 tablet by mouth daily.      . Cholecalciferol (VITAMIN D) 2000 UNITS tablet Take 2,000 Units by mouth daily.    . fish oil-omega-3 fatty acids 1000 MG capsule Take 2 g by mouth daily.      . folic acid (FOLVITE) 1 MG  tablet Take 1 mg by mouth daily.    . Ipratropium-Albuterol (COMBIVENT) 20-100 MCG/ACT AERS respimat Inhale 1 puff into the lungs every 6 (six) hours as needed for wheezing. 4 g 1  . KLOR-CON M20 20 MEQ tablet TAKE 1 TABLET BY MOUTH DAILY 30 tablet 3  . meloxicam (MOBIC) 15 MG tablet TAKE 1 TABLET (15 MG TOTAL) BY MOUTH DAILY.  3  . omeprazole (PRILOSEC) 40 MG capsule Take 40 mg by mouth 2 (two) times daily.   3  . oxyCODONE (OXYCONTIN) 10 mg 12 hr tablet Take 1 tablet (10 mg total) by mouth every 12 (twelve) hours. 60 tablet 0  . prochlorperazine (COMPAZINE) 10 MG tablet Take 10 mg by mouth every 6 (six) hours as needed for nausea or vomiting.     Marland Kitchen Specialty Vitamins Products (ECHINACEA C COMPLETE PO) Take 1 tablet by mouth daily.     . SUMAtriptan (IMITREX) 100 MG tablet Take 100 mg by mouth as needed for migraine or headache. Reported on 04/01/2015    . clarithromycin (BIAXIN) 500 MG tablet Take 500 mg by mouth 2 (two) times daily. Reported on 06/25/2015  0  . oxyCODONE (OXY IR/ROXICODONE) 5 MG immediate release tablet Take 1-2 pills, if needed, every 4 hours for pain secondary to cancer. (Patient not taking: Reported on 06/25/2015) 120 tablet 0  . PROAIR RESPICLICK 875 (90 BASE) MCG/ACT AEPB Reported on 06/25/2015  0  . [DISCONTINUED] citalopram (CELEXA) 20 MG tablet Take 1 tablet (20 mg total) by mouth daily. (Patient not taking: Reported on 04/01/2015) 30 tablet 6   No current facility-administered medications for this encounter.   Facility-Administered Medications Ordered in Other Encounters  Medication Dose Route Frequency Provider Last Rate Last Dose  . sodium chloride 0.9 % injection 10 mL  10 mL Intravenous PRN Golden Pop, FNP   10 mL at 12/06/13 1149    REVIEW OF SYSTEMS:  On review of systems, the patient reports that she has pain in her neck,  left shoulder, upper back, and stomach at a pain level of 5/10. She is taking oxycodone 10 mg q 12 hours for this. She also reports that  she has a history of 2 bulging discs in her neck. She states that pain worsened about 3-4 weeks ago in her neck and left shoulder. She states that her left arm is also in pain. She states that her lower back hurts as well. She reports that her neck pain usually progresses to a migraine. She believes that her new medication seems to be helping with this. She reports pain in her upper abdomen. She denies having any bladder issues. She does report that she was having constipation but took magnesium citrate yesterday and is now having diarrhea. She denies any numbness or weakness in her extremities. She denies any blurred vision. She denies any difficulty with swallowing as well as pain with swallowing. She denies any bloody sputum. She states that chemotherapy treatment has not helped to alleviate her pain. Reports a good appetite. She reports that  she is usually ambulatory, but is using a wheelchair today due to feeling dizzy.     PHYSICAL EXAM:  height is '5\' 1"'$  (1.549 m) and weight is 124 lb 9.6 oz (56.518 kg). Her oral temperature is 98.3 F (36.8 C). Her blood pressure is 120/76 and her pulse is 113. Her oxygen saturation is 99%.    General: Alert and oriented, in no acute distress HEENT: Head is normocephalic. Extraocular movements are intact. Oropharynx is clear. Neck: Neck is supple, no palpable cervical or supraclavicular lymphadenopathy. Heart: Regular in rate and rhythm with no murmurs, rubs, or gallops. Chest: Clear to auscultation bilaterally, with no rhonchi, wheezes, or rales. Abdomen: Soft, nontender, nondistended, with no rigidity or guarding. Extremities: No cyanosis or edema. Lymphatics: see Neck Exam Skin: No concerning lesions. Musculoskeletal: symmetric strength and muscle tone throughout. Neurologic: Cranial nerves II through XII are grossly intact. No obvious focalities. Speech is fluent. Coordination is intact. Psychiatric: Judgment and insight are intact. Affect is  appropriate.  ECOG = 2  LABORATORY DATA:  Lab Results  Component Value Date   WBC 6.7 06/17/2015   HGB 12.0 06/17/2015   HCT 36.7 06/17/2015   MCV 85 06/17/2015   PLT 260 06/17/2015   NEUTROABS 3.5 06/17/2015   Lab Results  Component Value Date   NA 138 06/17/2015   K 3.8 06/17/2015   CL 104 06/17/2015   CO2 28 06/17/2015   GLUCOSE 93 06/17/2015   CREATININE 0.8 06/17/2015   CALCIUM 8.3 06/17/2015      RADIOGRAPHY: Mr Cervical Spine W Wo Contrast  06/07/2015  CLINICAL DATA:  Neck, left shoulder, scapula, and left arm pain for 7 months. History of metastatic lung cancer. EXAM: MRI CERVICAL SPINE WITHOUT AND WITH CONTRAST TECHNIQUE: Multiplanar and multiecho pulse sequences of the cervical spine, to include the craniocervical junction and cervicothoracic junction, were obtained according to standard protocol without and with intravenous contrast. CONTRAST:  10 mL MultiHance COMPARISON:  PET-CT 05/08/2015. Brain MRI 11/18/2014. Soft tissue neck CT 11/14/2007. Cervical spine MRI 02/27/2003. FINDINGS: There is unchanged straightening of the cervical lordosis with slight retrolisthesis of C4 on C5. Vertebral body heights are preserved. As seen on recent PET-CT there are extensive sclerotic metastases throughout the cervical and visualized upper thoracic spine. The C4 vertebral body is essentially completely involved. There is involvement of the right greater than left lateral masses and posterior arch of C1. All cervical vertebral bodies are involved, and most levels demonstrate posterior element involvement. The visualized portion of the upper thoracic spine demonstrates vertebral body and posterior element involvement from T1-T3. There are also lesions involving the skullbase including the clivus which are new from the prior brain MRI. No definite or epidural tumor is identified in the cervical spine. Multilevel cervical disc space narrowing is present, moderate at C4-5 and C5-6. The cervical  spinal cord is normal in caliber and signal. A 1.4 cm short axis right level II lymph node is similar in size to the prior PET-CT. C2-3: Slight right facet arthrosis. No disc herniation or stenosis. C3-4: Broad-based posterior disc osteophyte complex has progressed from the 2005 MRI but does not result in significant stenosis. C4-5: Broad-based posterior disc osteophyte complex has progressed and results in moderate bilateral neural foraminal stenosis and borderline spinal stenosis. C5-6: Broad-based posterior disc osteophyte complex eccentric to the left has mildly progressed and results in moderate left neural foraminal stenosis and borderline spinal stenosis. C6-7: Broad central disc protrusion appears slightly larger than on the prior MRI  without spinal stenosis. There is minimal left neural foraminal narrowing. C7-T1:  New tiny central disc protrusion without stenosis. IMPRESSION: 1. Widespread osseous metastases throughout the cervical and visualized upper thoracic spine as well as skullbase. No epidural tumor or spinal cord compression. 2. Progressive cervical disc degeneration from 2005 with moderate neural foraminal stenosis bilaterally at C4-5 and on the left at C5-6. Electronically Signed   By: Logan Bores M.D.   On: 06/07/2015 10:31      IMPRESSION: Ms. Philbrick is a 65 y.o female with metastatic adenocarcinoma of the lung.  PLAN: I will review her prior radiation documents from [facility]. The patient has signed a release for this information. I spoke to the patient today regarding her diagnosis and options for radiation treatment. We discussed the potential risks, benefits, and side effects of treatment. She has signed informed consent and is prepared to proceed with radiation. She will undergo CT simulation this afternoon at 1 PM. Patient's most significant pain at this time appears to be in the neck in May possibly be explaining some of her pain radiation to the left shoulder region from involvement  in the cervical spine/neck region.     ------------------------------------------------  Blair Promise, PhD, MD  This document serves as a record of services personally performed by Gery Pray, MD. It was created on his behalf by Jenell Milliner, a trained medical scribe. The creation of this record is based on the scribe's personal observations and the provider's statements to them. This document has been checked and approved by the attending provider.

## 2015-06-25 NOTE — Progress Notes (Signed)
Please see the Nurse Progress Note in the MD Initial Consult Encounter for this patient. 

## 2015-06-27 ENCOUNTER — Emergency Department (HOSPITAL_COMMUNITY)
Admission: EM | Admit: 2015-06-27 | Discharge: 2015-06-28 | Disposition: A | Discharge status: Home / Self Care | Payer: PPO | Attending: Emergency Medicine | Admitting: Emergency Medicine

## 2015-06-27 ENCOUNTER — Encounter (HOSPITAL_COMMUNITY): Payer: Self-pay | Admitting: Emergency Medicine

## 2015-06-27 ENCOUNTER — Emergency Department (HOSPITAL_COMMUNITY): Payer: PPO

## 2015-06-27 DIAGNOSIS — K5900 Constipation, unspecified: Secondary | ICD-10-CM

## 2015-06-27 DIAGNOSIS — Z87891 Personal history of nicotine dependence: Secondary | ICD-10-CM | POA: Insufficient documentation

## 2015-06-27 DIAGNOSIS — Z85118 Personal history of other malignant neoplasm of bronchus and lung: Secondary | ICD-10-CM | POA: Diagnosis not present

## 2015-06-27 DIAGNOSIS — R109 Unspecified abdominal pain: Secondary | ICD-10-CM | POA: Diagnosis not present

## 2015-06-27 DIAGNOSIS — Z79899 Other long term (current) drug therapy: Secondary | ICD-10-CM | POA: Insufficient documentation

## 2015-06-27 DIAGNOSIS — C7951 Secondary malignant neoplasm of bone: Secondary | ICD-10-CM | POA: Diagnosis not present

## 2015-06-27 DIAGNOSIS — C3490 Malignant neoplasm of unspecified part of unspecified bronchus or lung: Secondary | ICD-10-CM | POA: Diagnosis not present

## 2015-06-27 DIAGNOSIS — R1084 Generalized abdominal pain: Secondary | ICD-10-CM

## 2015-06-27 DIAGNOSIS — Z7982 Long term (current) use of aspirin: Secondary | ICD-10-CM | POA: Diagnosis not present

## 2015-06-27 DIAGNOSIS — Z8673 Personal history of transient ischemic attack (TIA), and cerebral infarction without residual deficits: Secondary | ICD-10-CM | POA: Diagnosis not present

## 2015-06-27 LAB — COMPREHENSIVE METABOLIC PANEL
ALBUMIN: 4.2 g/dL (ref 3.5–5.0)
ALT: 17 U/L (ref 14–54)
ANION GAP: 7 (ref 5–15)
AST: 20 U/L (ref 15–41)
Alkaline Phosphatase: 119 U/L (ref 38–126)
BILIRUBIN TOTAL: 0.7 mg/dL (ref 0.3–1.2)
BUN: 13 mg/dL (ref 6–20)
CHLORIDE: 103 mmol/L (ref 101–111)
CO2: 24 mmol/L (ref 22–32)
Calcium: 8.9 mg/dL (ref 8.9–10.3)
Creatinine, Ser: 0.49 mg/dL (ref 0.44–1.00)
GFR calc Af Amer: >60 mL/min (ref >60)
GFR calc non Af Amer: >60 mL/min (ref >60)
GLUCOSE: 99 mg/dL (ref 65–99)
POTASSIUM: 4.3 mmol/L (ref 3.5–5.1)
SODIUM: 134 mmol/L — AB (ref 135–145)
TOTAL PROTEIN: 7.1 g/dL (ref 6.5–8.1)

## 2015-06-27 LAB — DIFFERENTIAL
BASOS ABS: 0.1 10*3/uL (ref 0.0–0.1)
BASOS PCT: 1 %
EOS ABS: 0.2 10*3/uL (ref 0.0–0.7)
EOS PCT: 2 %
LYMPHS ABS: 2.4 10*3/uL (ref 0.7–4.0)
Lymphocytes Relative: 39 %
Monocytes Absolute: 0.3 10*3/uL (ref 0.1–1.0)
Monocytes Relative: 5 %
NEUTROS PCT: 53 %
Neutro Abs: 3.3 10*3/uL (ref 1.7–7.7)

## 2015-06-27 LAB — URINALYSIS, ROUTINE W REFLEX MICROSCOPIC
Bilirubin Urine: NEGATIVE
Glucose, UA: NEGATIVE mg/dL
Hgb urine dipstick: NEGATIVE
Ketones, ur: NEGATIVE mg/dL
Nitrite: NEGATIVE
Protein, ur: NEGATIVE mg/dL
Specific Gravity, Urine: 1.012 (ref 1.005–1.030)
pH: 6.5 (ref 5.0–8.0)

## 2015-06-27 LAB — CBC
HEMATOCRIT: 38.2 % (ref 36.0–46.0)
HEMOGLOBIN: 12.5 g/dL (ref 12.0–15.0)
MCH: 27.4 pg (ref 26.0–34.0)
MCHC: 32.7 g/dL (ref 30.0–36.0)
MCV: 83.8 fL (ref 78.0–100.0)
Platelets: 336 10*3/uL (ref 150–400)
RBC: 4.56 MIL/uL (ref 3.87–5.11)
RDW: 16 % — ABNORMAL HIGH (ref 11.5–15.5)
WBC: 5.9 10*3/uL (ref 4.0–10.5)

## 2015-06-27 LAB — I-STAT CG4 LACTIC ACID, ED
Lactic Acid, Venous: 1.3 mmol/L (ref 0.5–2.0)
Lactic Acid, Venous: 1.31 mmol/L (ref 0.5–2.0)

## 2015-06-27 LAB — LIPASE, BLOOD: LIPASE: 25 U/L (ref 11–51)

## 2015-06-27 LAB — URINE MICROSCOPIC-ADD ON
Bacteria, UA: NONE SEEN
RBC / HPF: NONE SEEN RBC/hpf (ref 0–5)

## 2015-06-27 MED ORDER — ONDANSETRON HCL 4 MG/2ML IJ SOLN
4.0000 mg | Freq: Once | INTRAMUSCULAR | Status: AC
  Administered 2015-06-27: 4 mg via INTRAVENOUS
  Filled 2015-06-27: qty 2

## 2015-06-27 MED ORDER — SENNOSIDES-DOCUSATE SODIUM 8.6-50 MG PO TABS: 1.0000 | ORAL_TABLET | Freq: Every day | ORAL | Status: AC

## 2015-06-27 MED ORDER — IOPAMIDOL (ISOVUE-300) INJECTION 61%
100.0000 mL | Freq: Once | INTRAVENOUS | Status: AC | PRN
  Administered 2015-06-27: 100 mL via INTRAVENOUS

## 2015-06-27 MED ORDER — MORPHINE SULFATE (PF) 4 MG/ML IV SOLN: 4.0000 mg | Freq: Once | INTRAVENOUS | Status: DC

## 2015-06-27 MED ORDER — MILK AND MOLASSES ENEMA
1.0000 | Freq: Once | RECTAL | Status: AC
  Administered 2015-06-27: 250 mL via RECTAL
  Filled 2015-06-27: qty 250

## 2015-06-27 MED ORDER — MORPHINE SULFATE (PF) 4 MG/ML IV SOLN
4.0000 mg | INTRAVENOUS | Status: DC | PRN
  Administered 2015-06-27: 4 mg via INTRAVENOUS
  Filled 2015-06-27: qty 1

## 2015-06-27 MED ORDER — POLYETHYLENE GLYCOL 3350 17 GM/SCOOP PO POWD: 17.0000 g | Freq: Once | ORAL | Status: AC

## 2015-06-27 MED ORDER — SODIUM CHLORIDE 0.9 % IV BOLUS (SEPSIS)
1000.0000 mL | Freq: Once | INTRAVENOUS | Status: AC
  Administered 2015-06-27: 1000 mL via INTRAVENOUS

## 2015-06-27 NOTE — ED Notes (Signed)
Pt ambulated to bathroom.  Was only able to obtain a scant amount of urine.  Discussed that in the future a bedside commode or bedpan is in order due to unsteady gait.  Will attempt to obtain more urine shortly

## 2015-06-27 NOTE — ED Provider Notes (Signed)
CSN: [id number]     Arrival date & time 06/27/15  1747 History   First MD Initiated Contact with Patient 06/27/15 1857     Chief Complaint  Patient presents with  . Abdominal Pain     (Consider location/radiation/quality/duration/timing/severity/associated sxs/prior Treatment) HPI 65 year old female who presents with abdominal pain. History of metastatic adenocarcinoma of the lung, hypertension, and history of hysterectomy, cholecystectomy, appendectomy, and hernia repair. States that she has been dealing with generalized abdominal pain for several weeks now, but this is acutely worsened over the past week. Not associated with eating. Has been constipated in the setting of taking oxycodone, and last bowel movement 3 days ago. No melena or hematochezia, no nausea or vomiting, no fevers or chills, and no urinary complaints. Past Medical History  Diagnosis Date  . Cancer [facility]) 2005 & 2008    Right Upper Lobe (2005) & Left Upper Lobe (2008). Subsequent Nodules treated empirically in 2010 & 2011 w/ XRT & chemo.  Marland Kitchen Hypertension   . Stroke ([employer])   . GERD (gastroesophageal reflux disease)   . B12 deficiency   . Vitamin D deficiency   . Lung cancer, primary, with metastasis from lung to other site [facility]) 04/01/2015  . Pleural effusion, malignant 04/01/2015   Past Surgical History  Procedure Laterality Date  . Vaginal hysterectomy  1983  . Cyst removed from ovary  1968  . Lung removal, partial  2005, 2008    R upper lobe and left upper lobe wedge  . Bronchoscopy    . Mediastinoscopy    . Cholecystectomy    . Appendectomy    . Hiatal hernia repair  2010   Family History  Problem Relation Age of Onset  . Heart attack Father   . Hypertension Mother   . Diabetes Mother   . Congestive Heart Failure Mother   . Breast cancer Sister   . Lung disease Neg Hx    Social History  Substance Use Topics  . Smoking status: Former Smoker -- 2.00 packs/day for 33 years    Types: Cigarettes    Start  date: 07/19/1967    Quit date: 01/23/2003  . Smokeless tobacco: Never Used  . Alcohol Use: No   OB History    No data available     Review of Systems 10/14 systems reviewed and are negative other than those stated in the HPI   Allergies  Demerol; Mefoxin; Topamax; and Avelox  Home Medications   Prior to Admission medications   Medication Sig Start Date End Date Taking? Authorizing Provider  aspirin 81 MG tablet Take 81 mg by mouth daily.     Yes Historical Provider, MD  budesonide-formoterol (SYMBICORT) 160-4.5 MCG/ACT inhaler Inhale 2 puffs into the lungs 2 (two) times daily.   Yes Historical Provider, MD  calcium-vitamin D (OSCAL) 250-125 MG-UNIT per tablet Take 1 tablet by mouth daily.     Yes Historical Provider, MD  Cholecalciferol (VITAMIN D) 2000 UNITS tablet Take 2,000 Units by mouth daily.   Yes Historical Provider, MD  clarithromycin (BIAXIN) 500 MG tablet Take 500 mg by mouth 2 (two) times daily. Reported on 06/25/2015 04/14/15  Yes Historical Provider, MD  Echinacea 400 MG CAPS Take 400 mg by mouth daily.   Yes Historical Provider, MD  fish oil-omega-3 fatty acids 1000 MG capsule Take 2 g by mouth daily.     Yes Historical Provider, MD  folic acid (FOLVITE) 1 MG tablet Take 1 mg by mouth daily.   Yes Historical Provider,  MD  Ipratropium-Albuterol (COMBIVENT) 20-100 MCG/ACT AERS respimat Inhale 1 puff into the lungs every 6 (six) hours as needed for wheezing. 02/20/15  Yes Volanda Napoleon, MD  KLOR-CON M20 20 MEQ tablet TAKE 1 TABLET BY MOUTH DAILY Patient taking differently: TAKE 20 MEQ BY MOUTH DAILY 12/10/14  Yes Volanda Napoleon, MD  omeprazole (PRILOSEC) 40 MG capsule Take 40 mg by mouth 2 (two) times daily.  12/05/13  Yes Historical Provider, MD  oxyCODONE (OXY IR/ROXICODONE) 5 MG immediate release tablet Take 1-2 pills, if needed, every 4 hours for pain secondary to cancer. 05/14/15  Yes Volanda Napoleon, MD  oxyCODONE (OXYCONTIN) 10 mg 12 hr tablet Take 1 tablet (10 mg  total) by mouth every 12 (twelve) hours. 06/23/15  Yes Volanda Napoleon, MD  PROAIR RESPICLICK 782 (90 BASE) MCG/ACT AEPB Inhale 2 puffs into the lungs twice daily as needed for shortness of breath or wheezing 10/11/14  Yes Historical Provider, MD  prochlorperazine (COMPAZINE) 10 MG tablet Take 10 mg by mouth every 6 (six) hours as needed for nausea or vomiting.  12/26/14  Yes Historical Provider, MD  SUMAtriptan (IMITREX) 100 MG tablet Take 100 mg by mouth as needed for migraine or headache. Reported on 04/01/2015   Yes Historical Provider, MD  polyethylene glycol powder (GLYCOLAX/MIRALAX) powder Take 17 g by mouth once. Dissolve 1 capful of powder into any liquid and take once daily. Can increase to 2 times daily if no effect after 3-4 days. 06/27/15   Forde Dandy, MD  senna-docusate (SENOKOT-S) 8.6-50 MG tablet Take 1 tablet by mouth daily. 06/27/15   Forde Dandy, MD   BP 138/89 mmHg  Pulse 83  Temp(Src) 97.8 F (36.6 C) (Oral)  Resp 18  SpO2 95% Physical Exam Physical Exam  Nursing note and vitals reviewed. Constitutional: Well developed, well nourished, non-toxic, and in no acute distress Head: Normocephalic and atraumatic.  Mouth/Throat: Oropharynx is clear and moist.  Neck: Normal range of motion. Neck supple.  Cardiovascular: Normal rate and regular rhythm.   Pulmonary/Chest: Effort normal and breath sounds normal.  Abdominal: Soft. There is diffuse abdominal tenderness. There is no rebound and no guarding. no CVA tenderness. Musculoskeletal: Normal range of motion.  Neurological: Alert, no facial droop, fluent speech, moves all extremities symmetrically Skin: Skin is warm and dry.  Psychiatric: Cooperative  ED Course  Procedures (including critical care time) Labs Review Labs Reviewed  COMPREHENSIVE METABOLIC PANEL - Abnormal; Notable for the following:    Sodium 134 (*)    All other components within normal limits  CBC - Abnormal; Notable for the following:    RDW 16.0 (*)     All other components within normal limits  URINALYSIS, ROUTINE W REFLEX MICROSCOPIC (NOT AT [facility]) - Abnormal; Notable for the following:    APPearance CLOUDY (*)    Leukocytes, UA TRACE (*)    All other components within normal limits  URINE MICROSCOPIC-ADD ON - Abnormal; Notable for the following:    Squamous Epithelial / LPF 0-5 (*)    All other components within normal limits  LIPASE, BLOOD  DIFFERENTIAL  I-STAT CG4 LACTIC ACID, ED  I-STAT CG4 LACTIC ACID, ED    Imaging Review Ct Abdomen Pelvis W Contrast  06/27/2015  CLINICAL DATA:  Abdominal pain. The patient is being treated for lung cancer. EXAM: CT ABDOMEN AND PELVIS WITH CONTRAST TECHNIQUE: Multidetector CT imaging of the abdomen and pelvis was performed using the standard protocol following bolus administration of intravenous  contrast. CONTRAST:  178m ISOVUE-300 IOPAMIDOL (ISOVUE-300) INJECTION 61% COMPARISON:  Chest CT January 11, 2015 and PET-CT May 08, 2014 FINDINGS: Chronic changes consistent with the patient's history are seen in the lingula, medial left lower lobe with masslike opacities. There is also right infrahilar opacity and a peripheral nodule on series 4, images 2 and 5. These findings are also stable. There is volume loss on the left. There is a hiatal hernia. No other abnormalities are seen within the chest. No free air or free fluid. There is elevation of the left hemidiaphragm. The patient is status post cholecystectomy. There is resulting prominence of the common bile duct which is within normal limits. The liver all, spleen, adrenal glands, pancreas, and kidneys are normal. Atherosclerotic changes are seen in the non aneurysmal aorta. No adenopathy is seen within the abdomen. Other than the hiatal hernia, the stomach and small bowel are normal. The colon demonstrates fecal loading but no evidence of colitis or inflammation. Fecal loading is most prominent in the cecum which extends into the right side of the  pelvis. The patient is status post appendectomy. The pelvis demonstrates a mildly prominent cecum filled with stool. No adenopathy. The patient is status post hysterectomy. The bladder is normal. Widespread bony metastatic disease is seen throughout all visualized bones. No filling defects in the upper renal collecting system. IMPRESSION: 1. Chronic changes consistent with the history of lung cancer in the lung bases. 2. Moderate fecal loading throughout the colon, particularly in the cecum which extends into the pelvis. 3. No acute abnormality in the abdomen. 4. Widespread bony metastatic disease. Electronically Signed   By: DDorise BullionIII M.D   On: 06/27/2015 21:49   I have personally reviewed and evaluated these images and lab results as part of my medical decision-making.   EKG Interpretation None      MDM   Final diagnoses:  Generalized abdominal pain  Constipation, unspecified constipation type    65year old female who presents with abdominal pain worsened over the past week.She is afebrile and hemodynamically stable. She has a soft and non-peritoneal abdomen, but diffusely tender. Question potential bowel obstruction in the setting of multiple abdominal surgeries, given that her last bowel movement was 3 days ago. She is overall unremarkable CBC, comp and metabolic panel, lipase, and urinalysis. CT was performed and shows no serious or emergent intra-abdominal process. She does have some moderate fecal loading, which I suspect may be causing her discomfort. She has been taking narcotic medications in the setting of her lung cancer without a bowel regimen. She is started on MiraLAX, senna and Colace. She will be given a enema here in the ED, and if feels improved will be discharged home with her bowel regimen.    DForde Dandy MD 06/29/15 [phone]

## 2015-06-27 NOTE — ED Notes (Signed)
Pt reports bilateral mid abd pain for the past 2-3 weeks. Hx of umbilical hernia. No v/d. Hx of lung ca, last chemo 2 weeks ago.

## 2015-06-27 NOTE — Discharge Instructions (Signed)
Your CT scan does not show anything serious but there is a significant amount of stool. Please start on stool regimen as prescribed. You can take miralax first and if not as effective, add on the senna and colace after 2-3 days. Return for worsening symptoms, including fever, worsening pain, vomiting and unable to keep down food/fluids, or any other symptoms concerning to you.  Abdominal Pain, Adult Many things can cause belly (abdominal) pain. Most times, the belly pain is not dangerous. Many cases of belly pain can be watched and treated at home. HOME CARE   Do not take medicines that help you go poop (laxatives) unless told to by your doctor.  Only take medicine as told by your doctor.  Eat or drink as told by your doctor. Your doctor will tell you if you should be on a special diet. GET HELP IF:  You do not know what is causing your belly pain.  You have belly pain while you are sick to your stomach (nauseous) or have runny poop (diarrhea).  You have pain while you pee or poop.  Your belly pain wakes you up at night.  You have belly pain that gets worse or better when you eat.  You have belly pain that gets worse when you eat fatty foods.  You have a fever. GET HELP RIGHT AWAY IF:   The pain does not go away within 2 hours.  You keep throwing up (vomiting).  The pain changes and is only in the right or left part of the belly.  You have bloody or tarry looking poop. MAKE SURE YOU:   Understand these instructions.  Will watch your condition.  Will get help right away if you are not doing well or get worse.   This information is not intended to replace advice given to you by your health care provider. Make sure you discuss any questions you have with your health care provider.   Document Released: 07/13/2007 Document Revised: 02/14/2014 Document Reviewed: 10/03/2012 Elsevier Interactive Patient Education [employer].

## 2015-06-28 NOTE — ED Provider Notes (Signed)
Plan at signout was for enema and pt to have BM Pt had BM and felt improved and discharged home   Ripley Fraise, MD 06/28/15 [phone]

## 2015-06-29 ENCOUNTER — Telehealth: Payer: Self-pay | Admitting: *Deleted

## 2015-06-29 NOTE — Telephone Encounter (Signed)
Patient went to the ED over the weekend for abdominal pain. She was discharged after an enema and bowel movement resulting in decreasing discomfort.   Dr Marin Olp reviewed chart and wants patient to take 15cc TID. Spoke to patient and instructed her to start the lactulose today. She already has an active prescription. Reviewed with patient to take the prn medication until she has a BM and then to take something daily. She understood.   She will call the office if she has any further issues.

## 2015-06-30 DIAGNOSIS — C7951 Secondary malignant neoplasm of bone: Secondary | ICD-10-CM | POA: Diagnosis not present

## 2015-06-30 DIAGNOSIS — Z510 Encounter for antineoplastic radiation therapy: Secondary | ICD-10-CM | POA: Diagnosis not present

## 2015-07-01 ENCOUNTER — Ambulatory Visit: Payer: PPO

## 2015-07-01 ENCOUNTER — Other Ambulatory Visit: Payer: PPO

## 2015-07-01 ENCOUNTER — Institutional Professional Consult (permissible substitution): Payer: PPO | Admitting: Radiation Oncology

## 2015-07-01 ENCOUNTER — Ambulatory Visit: Payer: PPO | Admitting: Hematology & Oncology

## 2015-07-02 ENCOUNTER — Ambulatory Visit
Admission: RE | Admit: 2015-07-02 | Discharge: 2015-07-02 | Disposition: A | Discharge status: Home / Self Care | Payer: PPO | Source: Ambulatory Visit | Attending: Radiation Oncology | Admitting: Radiation Oncology

## 2015-07-02 DIAGNOSIS — Z510 Encounter for antineoplastic radiation therapy: Secondary | ICD-10-CM | POA: Diagnosis not present

## 2015-07-02 DIAGNOSIS — C7951 Secondary malignant neoplasm of bone: Secondary | ICD-10-CM | POA: Diagnosis not present

## 2015-07-02 NOTE — Progress Notes (Signed)
  Radiation Oncology         [phone] ________________________________  Name: [name] MRN: [id number]  Date: 07/02/2015  DOB: November 21, 1950  Simulation Verification Note    ICD-9-CM ICD-10-CM   1. Bone metastasis (HCC) 198.5 C79.51     Status: outpatient  NARRATIVE: The patient was brought to the treatment unit and placed in the planned treatment position. The clinical setup was verified. Then port films were obtained and uploaded to the radiation oncology medical record software.  The treatment beams were carefully compared against the planned radiation fields. The position location and shape of the radiation fields was reviewed. They targeted volume of tissue appears to be appropriately covered by the radiation beams. Organs at risk appear to be excluded as planned.  Based on my personal review, I approved the simulation verification. The patient's treatment will proceed as planned.  -----------------------------------  Blair Promise, PhD, MD

## 2015-07-03 ENCOUNTER — Ambulatory Visit (HOSPITAL_BASED_OUTPATIENT_CLINIC_OR_DEPARTMENT_OTHER): Payer: PPO | Admitting: Hematology & Oncology

## 2015-07-03 ENCOUNTER — Other Ambulatory Visit (HOSPITAL_BASED_OUTPATIENT_CLINIC_OR_DEPARTMENT_OTHER): Payer: PPO

## 2015-07-03 ENCOUNTER — Ambulatory Visit (HOSPITAL_BASED_OUTPATIENT_CLINIC_OR_DEPARTMENT_OTHER): Payer: PPO

## 2015-07-03 ENCOUNTER — Encounter: Payer: Self-pay | Admitting: Hematology & Oncology

## 2015-07-03 ENCOUNTER — Ambulatory Visit
Admission: RE | Admit: 2015-07-03 | Discharge: 2015-07-03 | Disposition: A | Discharge status: Home / Self Care | Payer: PPO | Source: Ambulatory Visit | Attending: Radiation Oncology | Admitting: Radiation Oncology

## 2015-07-03 VITALS — BP 145/93 | HR 79 | Temp 98.4°F | Resp 18 | Ht 61.0 in | Wt 126.8 lb

## 2015-07-03 VITALS — BP 136/90 | HR 79 | Resp 20

## 2015-07-03 DIAGNOSIS — C3492 Malignant neoplasm of unspecified part of left bronchus or lung: Secondary | ICD-10-CM

## 2015-07-03 DIAGNOSIS — C779 Secondary and unspecified malignant neoplasm of lymph node, unspecified: Secondary | ICD-10-CM | POA: Diagnosis not present

## 2015-07-03 DIAGNOSIS — C7800 Secondary malignant neoplasm of unspecified lung: Secondary | ICD-10-CM

## 2015-07-03 DIAGNOSIS — Z510 Encounter for antineoplastic radiation therapy: Secondary | ICD-10-CM | POA: Diagnosis not present

## 2015-07-03 DIAGNOSIS — J910 Malignant pleural effusion: Secondary | ICD-10-CM

## 2015-07-03 DIAGNOSIS — Z5112 Encounter for antineoplastic immunotherapy: Secondary | ICD-10-CM | POA: Diagnosis not present

## 2015-07-03 DIAGNOSIS — C3490 Malignant neoplasm of unspecified part of unspecified bronchus or lung: Secondary | ICD-10-CM

## 2015-07-03 DIAGNOSIS — Z5111 Encounter for antineoplastic chemotherapy: Secondary | ICD-10-CM | POA: Diagnosis not present

## 2015-07-03 DIAGNOSIS — C7951 Secondary malignant neoplasm of bone: Secondary | ICD-10-CM

## 2015-07-03 DIAGNOSIS — C3491 Malignant neoplasm of unspecified part of right bronchus or lung: Secondary | ICD-10-CM

## 2015-07-03 DIAGNOSIS — C3432 Malignant neoplasm of lower lobe, left bronchus or lung: Secondary | ICD-10-CM

## 2015-07-03 DIAGNOSIS — K5903 Drug induced constipation: Secondary | ICD-10-CM | POA: Insufficient documentation

## 2015-07-03 LAB — CBC WITH DIFFERENTIAL (CANCER CENTER ONLY)
BASO#: 0.1 10*3/uL (ref 0.0–0.2)
BASO%: 2.5 % — AB (ref 0.0–2.0)
EOS ABS: 0.3 10*3/uL (ref 0.0–0.5)
EOS%: 5.3 % (ref 0.0–7.0)
HCT: 36.5 % (ref 34.8–46.6)
HGB: 12 g/dL (ref 11.6–15.9)
LYMPH#: 1.6 10*3/uL (ref 0.9–3.3)
LYMPH%: 32.5 % (ref 14.0–48.0)
MCH: 28 pg (ref 26.0–34.0)
MCHC: 32.9 g/dL (ref 32.0–36.0)
MCV: 85 fL (ref 81–101)
MONO#: 0.7 10*3/uL (ref 0.1–0.9)
MONO%: 14.8 % — ABNORMAL HIGH (ref 0.0–13.0)
NEUT#: 2.2 10*3/uL (ref 1.5–6.5)
NEUT%: 44.9 % (ref 39.6–80.0)
Platelets: 274 10*3/uL (ref 145–400)
RBC: 4.28 10*6/uL (ref 3.70–5.32)
RDW: 16.6 % — ABNORMAL HIGH (ref 11.1–15.7)
WBC: 4.9 10*3/uL (ref 3.9–10.0)

## 2015-07-03 LAB — CMP (CANCER CENTER ONLY)
ALBUMIN: 3.4 g/dL (ref 3.3–5.5)
ALK PHOS: 96 U/L — AB (ref 26–84)
ALT(SGPT): 21 U/L (ref 10–47)
AST: 23 U/L (ref 11–38)
BUN: 11 mg/dL (ref 7–22)
CHLORIDE: 105 meq/L (ref 98–108)
CO2: 26 mEq/L (ref 18–33)
Calcium: 9 mg/dL (ref 8.0–10.3)
Creat: 0.8 mg/dl (ref 0.6–1.2)
GLUCOSE: 87 mg/dL (ref 73–118)
POTASSIUM: 4.1 meq/L (ref 3.3–4.7)
Sodium: 139 mEq/L (ref 128–145)
TOTAL PROTEIN: 6.7 g/dL (ref 6.4–8.1)
Total Bilirubin: 0.5 mg/dl (ref 0.20–1.60)

## 2015-07-03 LAB — LACTATE DEHYDROGENASE: LDH: 166 U/L (ref 125–245)

## 2015-07-03 MED ORDER — SODIUM CHLORIDE 0.9 % IV SOLN
10.0000 mg/kg | Freq: Once | INTRAVENOUS | Status: AC
  Administered 2015-07-03: 575 mg via INTRAVENOUS
  Filled 2015-07-03: qty 16

## 2015-07-03 MED ORDER — PROCHLORPERAZINE MALEATE 10 MG PO TABS
10.0000 mg | ORAL_TABLET | Freq: Once | ORAL | Status: AC
  Administered 2015-07-03: 10 mg via ORAL

## 2015-07-03 MED ORDER — HEPARIN SOD (PORK) LOCK FLUSH 100 UNIT/ML IV SOLN
500.0000 [IU] | Freq: Once | INTRAVENOUS | Status: AC | PRN
  Administered 2015-07-03: 500 [IU]
  Filled 2015-07-03: qty 5

## 2015-07-03 MED ORDER — PROCHLORPERAZINE MALEATE 10 MG PO TABS
ORAL_TABLET | ORAL | Status: AC
  Filled 2015-07-03: qty 1

## 2015-07-03 MED ORDER — SODIUM CHLORIDE 0.9% FLUSH
10.0000 mL | INTRAVENOUS | Status: DC | PRN
  Administered 2015-07-03: 10 mL
  Filled 2015-07-03: qty 10

## 2015-07-03 MED ORDER — SODIUM CHLORIDE 0.9 % IV SOLN
Freq: Once | INTRAVENOUS | Status: AC
  Administered 2015-07-03: 11:00:00 via INTRAVENOUS

## 2015-07-03 MED ORDER — VINORELBINE TARTRATE CHEMO INJECTION 50 MG/5ML
25.7000 mg/m2 | Freq: Once | INTRAVENOUS | Status: AC
  Administered 2015-07-03: 40 mg via INTRAVENOUS
  Filled 2015-07-03: qty 4

## 2015-07-03 NOTE — Patient Instructions (Signed)
Vinorelbine injection What is this medicine? VINORELBINE (vi NOR el been) is a chemotherapy drug. It targets fast dividing cells, like cancer cells, and causes these cells to die. This medicine is used to treat cancer, like lung cancer. This medicine may be used for other purposes; ask your health care provider or pharmacist if you have questions. What should I tell my health care provider before I take this medicine? They need to know if you have any of these conditions: -blood disorders -infection (especially chickenpox and herpes) -liver disease -lung disease -nervous system disease -previous or current radiation therapy -an unusual or allergic reaction to vinorelbine, other chemotherapy agents, other medicines, foods, dyes, or preservatives -pregnant or trying to get pregnant -breast-feeding How should I use this medicine? This drug is given as an infusion into a vein. It is administered in a hospital or clinic by a specially trained health care professional. If you have pain, swelling, burning or any unusual feeling around the site of your injection, tell your health care professional right away. Talk to your pediatrician regarding the use of this medicine in children. Special care may be needed. Overdosage: If you think you have taken too much of this medicine contact a poison control center or emergency room at once. NOTE: This medicine is only for you. Do not share this medicine with others. What if I miss a dose? It is important not to miss your dose. Call your doctor or health care professional if you are unable to keep an appointment. What may interact with this medicine? Do not take this medicine with any of the following medications: -itraconazole -voriconazole This medicine may also interact with the following medications: -cyclosporine -erythromycin -fluconazole -ketoconazole -medicines for HIV like delavirdine, efavirenz, nevirapine -medicines for seizures like  ethotoin, fosphenotoin, phenytoin -medicines to increase blood counts like filgrastim, pegfilgrastim, sargramostim -other chemotherapy drugs like cisplatin, mitomycin, paclitaxel -vaccines Talk to your doctor or health care professional before taking any of these medicines: -acetaminophen -aspirin -ibuprofen -ketoprofen -naproxen This list may not describe all possible interactions. Give your health care provider a list of all the medicines, herbs, non-prescription drugs, or dietary supplements you use. Also tell them if you smoke, drink alcohol, or use illegal drugs. Some items may interact with your medicine. What should I watch for while using this medicine? Your condition will be monitored carefully while you are receiving this medicine. You will need important blood work done while you are taking this medicine. This drug may make you feel generally unwell. This is not uncommon, as chemotherapy can affect healthy cells as well as cancer cells. Report any side effects. Continue your course of treatment even though you feel ill unless your doctor tells you to stop. In some cases, you may be given additional medicines to help with side effects. Follow all directions for their use. Call your doctor or health care professional for advice if you get a fever, chills or sore throat, or other symptoms of a cold or flu. Do not treat yourself. This drug decreases your body's ability to fight infections. Try to avoid being around people who are sick. This medicine may increase your risk to bruise or bleed. Call your doctor or health care professional if you notice any unusual bleeding. Be careful brushing and flossing your teeth or using a toothpick because you may get an infection or bleed more easily. If you have any dental work done, tell your dentist you are receiving this medicine. Avoid taking products that contain aspirin,  acetaminophen, ibuprofen, naproxen, or ketoprofen unless instructed by your  doctor. These medicines may hide a fever. Do not become pregnant while taking this medicine. Women should inform their doctor if they wish to become pregnant or think they might be pregnant. There is a potential for serious side effects to an unborn child. Talk to your health care professional or pharmacist for more information. Do not breast-feed an infant while taking this medicine. Men must use a latex condom during sexual contact with a woman while taking this medicine and for 3 months after you stop taking this medicine. A latex condom is needed even if you have had a vasectomy. Contact your doctor right away if your partner becomes pregnant. Do not donate sperm while taking this medicine and for 3 months after you stop taking this medicine. Men should inform their doctors if they wish to father a child. This medicine may lower sperm counts. What side effects may I notice from receiving this medicine? Side effects that you should report to your doctor or health care professional as soon as possible: -allergic reactions like skin rash, itching or hives, swelling of the face, lips, or tongue -low blood counts - This drug may decrease the number of white blood cells, red blood cells and platelets. You may be at increased risk for infections and bleeding. -signs of infection - fever or chills, cough, sore throat, pain or difficulty passing urine -signs of decreased platelets or bleeding - bruising, pinpoint red spots on the skin, black, tarry stools, nosebleeds -signs of decreased red blood cells - unusually weak or tired, fainting spells, lightheadedness -breathing problems -chest pain -constipation -cough -mouth sores -nausea and vomiting -pain, swelling, redness or irritation at the injection site -pain, tingling, numbness in the hands or feet -stomach pain -trouble passing urine or change in the amount of urine Side effects that usually do not require medical attention (report to your doctor  or health care professional if they continue or are bothersome): -diarrhea -hair loss -jaw pain -loss of appetite This list may not describe all possible side effects. Call your doctor for medical advice about side effects. You may report side effects to FDA at 1-800-FDA-1088. Where should I keep my medicine? This drug is given in a hospital or clinic and will not be stored at home. NOTE: This sheet is a summary. It may not cover all possible information. If you have questions about this medicine, talk to your doctor, pharmacist, or health care provider.    2016, Elsevier/Gold Standard. (2013-07-24 11:37:59) Bevacizumab injection What is this medicine? BEVACIZUMAB (be va SIZ yoo mab) is a monoclonal antibody. It is used to treat cervical cancer, colorectal cancer, glioblastoma multiforme, non-small cell lung cancer (NSCLC), ovarian cancer, and renal cell cancer. This medicine may be used for other purposes; ask your health care provider or pharmacist if you have questions. What should I tell my health care provider before I take this medicine? They need to know if you have any of these conditions: -blood clots -heart disease, including heart failure, heart attack, or chest pain (angina) -high blood pressure -infection (especially a virus infection such as chickenpox, cold sores, or herpes) -kidney disease -lung disease -prior chemotherapy with doxorubicin, daunorubicin, epirubicin, or other anthracycline type chemotherapy agents -recent or ongoing radiation therapy -recent surgery -stroke -an unusual or allergic reaction to bevacizumab, hamster proteins, mouse proteins, other medicines, foods, dyes, or preservatives -pregnant or trying to get pregnant -breast-feeding How should I use this medicine? This medicine is  for infusion into a vein. It is given by a health care professional in a hospital or clinic setting. Talk to your pediatrician regarding the use of this medicine in  children. Special care may be needed. Overdosage: If you think you have taken too much of this medicine contact a poison control center or emergency room at once. NOTE: This medicine is only for you. Do not share this medicine with others. What if I miss a dose? It is important not to miss your dose. Call your doctor or health care professional if you are unable to keep an appointment. What may interact with this medicine? Interactions are not expected. This list may not describe all possible interactions. Give your health care provider a list of all the medicines, herbs, non-prescription drugs, or dietary supplements you use. Also tell them if you smoke, drink alcohol, or use illegal drugs. Some items may interact with your medicine. What should I watch for while using this medicine? Your condition will be monitored carefully while you are receiving this medicine. You will need important blood work and urine testing done while you are taking this medicine. During your treatment, let your health care professional know if you have any unusual symptoms, such as difficulty breathing. This medicine may rarely cause 'gastrointestinal perforation' (holes in the stomach, intestines or colon), a serious side effect requiring surgery to repair. This medicine should be started at least 28 days following major surgery and the site of the surgery should be totally healed. Check with your doctor before scheduling dental work or surgery while you are receiving this treatment. Talk to your doctor if you have recently had surgery or if you have a wound that has not healed. Do not become pregnant while taking this medicine or for 6 months after stopping it. Women should inform their doctor if they wish to become pregnant or think they might be pregnant. There is a potential for serious side effects to an unborn child. Talk to your health care professional or pharmacist for more information. Do not breast-feed an infant  while taking this medicine. This medicine has caused ovarian failure in some women. This medicine may interfere with the ability to have a child. You should talk to your doctor or health care professional if you are concerned about your fertility. What side effects may I notice from receiving this medicine? Side effects that you should report to your doctor or health care professional as soon as possible: -allergic reactions like skin rash, itching or hives, swelling of the face, lips, or tongue -signs of infection - fever or chills, cough, sore throat, pain or trouble passing urine -signs of decreased platelets or bleeding - bruising, pinpoint red spots on the skin, black, tarry stools, nosebleeds, blood in the urine -breathing problems -changes in vision -chest pain -confusion -jaw pain, especially after dental work -mouth sores -seizures -severe abdominal pain -severe headache -sudden numbness or weakness of the face, arm or leg -swelling of legs or ankles -symptoms of a stroke: change in mental awareness, inability to talk or move one side of the body (especially in patients with lung cancer) -trouble passing urine or change in the amount of urine -trouble speaking or understanding -trouble walking, dizziness, loss of balance or coordination Side effects that usually do not require medical attention (report to your doctor or health care professional if they continue or are bothersome): -constipation -diarrhea -dry skin -headache -loss of appetite -nausea, vomiting This list may not describe all possible side effects.  Call your doctor for medical advice about side effects. You may report side effects to FDA at 1-800-FDA-1088. Where should I keep my medicine? This drug is given in a hospital or clinic and will not be stored at home. NOTE: This sheet is a summary. It may not cover all possible information. If you have questions about this medicine, talk to your doctor, pharmacist, or  health care provider.    2016, Elsevier/Gold Standard. (2014-03-25 16:58:44)

## 2015-07-03 NOTE — Progress Notes (Signed)
Hematology and Oncology Follow Up Visit  Kara LAGUE [id number] 06-15-1950 65 y.o. 07/03/2015   Principle Diagnosis:  Metastatic adenocarcinoma the lung - bone metastases, lymph node metastasis and intrapulmonary metastasis Malignant left pleural effusion  Current Therapy:  Navelbine/Avastin q 2wk dosing - s/p c#3 Tecentriq s/p cycle 4 Xgeva 120 mg subcutaneous every month Palliative radiation therapy to the neck.    Interim History:  Ms. [name] is here today with her husband. Actually, she looks pretty good. She's not complaining much in the way of pain down her left arm. She started radiation therapy earlier this week. She has a total of 14 treatments.  She is tolerated chemotherapy well so far. She really has not had a lot of side effects. So far, I don't think the malignant pleural effusion has built up.  She's had no vomiting. She's had little bit of nausea. She's on medicine now that is not causing constipation. She's had to having good bowel movements.  As always, there is issues with the family. Thank you, everything looks to be very stable right now. Her family has to go to court on May 31 to get guardianship of a granddaughter. The granddaughter's mother is a prostitute and the father is in jail.  Overall, I said that her performance status is ECOG 1.  Medications:    Medication List       This list is accurate as of: 07/03/15 10:08 AM.  Always use your most recent med list.               aspirin 81 MG tablet  Take 81 mg by mouth daily.     budesonide-formoterol 160-4.5 MCG/ACT inhaler  Commonly known as:  SYMBICORT  Inhale 2 puffs into the lungs 2 (two) times daily.     calcium-vitamin D 250-125 MG-UNIT tablet  Commonly known as:  OSCAL  Take 1 tablet by mouth daily.     clarithromycin 500 MG tablet  Commonly known as:  BIAXIN  Take 500 mg by mouth 2 (two) times daily. Reported on 06/25/2015     Echinacea 400 MG Caps  Take 400 mg by mouth daily.     fish oil-omega-3 fatty acids 1000 MG capsule  Take 2 g by mouth daily.     folic acid 1 MG tablet  Commonly known as:  FOLVITE  Take 1 mg by mouth daily.     KLOR-CON M20 20 MEQ tablet  Generic drug:  potassium chloride SA  TAKE 1 TABLET BY MOUTH DAILY     lactulose 10 GM/15ML solution  Commonly known as:  CHRONULAC  Take 10 g by mouth 3 (three) times daily.     omeprazole 40 MG capsule  Commonly known as:  PRILOSEC  Take 40 mg by mouth 2 (two) times daily.     oxyCODONE 5 MG immediate release tablet  Commonly known as:  Oxy IR/ROXICODONE  Take 1-2 pills, if needed, every 4 hours for pain secondary to cancer.     oxyCODONE 10 mg 12 hr tablet  Commonly known as:  OXYCONTIN  Take 1 tablet (10 mg total) by mouth every 12 (twelve) hours.     polyethylene glycol powder powder  Commonly known as:  GLYCOLAX/MIRALAX  Take 17 g by mouth once. Dissolve 1 capful of powder into any liquid and take once daily. Can increase to 2 times daily if no effect after 3-4 days.     PROAIR RESPICLICK 759 (90 Base) MCG/ACT Aepb  Generic drug:  Albuterol Sulfate  Inhale 2 puffs into the lungs twice daily as needed for shortness of breath or wheezing     prochlorperazine 10 MG tablet  Commonly known as:  COMPAZINE  Take 10 mg by mouth every 6 (six) hours as needed for nausea or vomiting.     senna-docusate 8.6-50 MG tablet  Commonly known as:  Senokot-S  Take 1 tablet by mouth daily.     SUMAtriptan 100 MG tablet  Commonly known as:  IMITREX  Take 100 mg by mouth as needed for migraine or headache. Reported on 04/01/2015     Vitamin D 2000 units tablet  Take 2,000 Units by mouth daily.        Allergies:  Allergies  Allergen Reactions  . Demerol Other (See Comments)    Syncope  . Mefoxin [Cefoxitin Sodium In Dextrose]     rash  . Topamax Other (See Comments)    Had trouble walking  . Avelox [Moxifloxacin Hcl In Nacl]     Hypotension.    Past Medical History, Surgical history,  Social history, and Family History were reviewed and updated.  Review of Systems: All other 10 point review of systems is negative.   Physical Exam:  height is '5\' 1"'$  (1.549 m) and weight is 126 lb 12.8 oz (57.516 kg). Her oral temperature is 98.4 F (36.9 C). Her blood pressure is 145/93 and her pulse is 79. Her respiration is 18.   Wt Readings from Last 3 Encounters:  07/03/15 126 lb 12.8 oz (57.516 kg)  06/25/15 124 lb 9.6 oz (56.518 kg)  06/17/15 122 lb 1.3 oz (55.375 kg)    Well-developed and well-nourished white female in no obvious distress. Head and neck exam shows no ocular or oral lesions. There are no palpable cervical or supraclavicular lymph nodes. Lungs are clear on the right side. There is still some slight decrease breath sounds on the left side. Cardiac exam regular rate and rhythm with no murmurs, rubs or bruits. Abdomen is soft. She is good bowel sounds. There is no fluid wave. There is no palpable liver or spleen tip. Back exam shows no tenderness over the spine, ribs or hips. Extremities shows no clubbing, cyanosis or edema. Skin exam shows no rashes, ecchymoses or petechia. Neurological exam shows no focal neurological deficits.   Lab Results  Component Value Date   WBC 4.9 07/03/2015   HGB 12.0 07/03/2015   HCT 36.5 07/03/2015   MCV 85 07/03/2015   PLT 274 07/03/2015   Lab Results  Component Value Date   FERRITIN 47 10/29/2013   IRON 64 10/29/2013   TIBC 233* 10/29/2013   UIBC 169 10/29/2013   IRONPCTSAT 27 10/29/2013   Lab Results  Component Value Date   RETICCTPCT 1.4 08/13/2008   RBC 4.28 07/03/2015   RETICCTABS 61.6 08/13/2008   No results found for: KPAFRELGTCHN, LAMBDASER, KAPLAMBRATIO No results found for: IGGSERUM, IGA, IGMSERUM No results found for: Odetta Pink, SPEI   Chemistry      Component Value Date/Time   NA 139 07/03/2015 0851   NA 134* 06/27/2015 1812   K 4.1 07/03/2015 0851    K 4.3 06/27/2015 1812   CL 105 07/03/2015 0851   CL 103 06/27/2015 1812   CO2 26 07/03/2015 0851   CO2 24 06/27/2015 1812   BUN 11 07/03/2015 0851   BUN 13 06/27/2015 1812   CREATININE 0.8 07/03/2015 0851   CREATININE 0.49 06/27/2015 1812  Component Value Date/Time   CALCIUM 9.0 07/03/2015 0851   CALCIUM 8.9 06/27/2015 1812   ALKPHOS 96* 07/03/2015 0851   ALKPHOS 119 06/27/2015 1812   AST 23 07/03/2015 0851   AST 20 06/27/2015 1812   ALT 21 07/03/2015 0851   ALT 17 06/27/2015 1812   BILITOT 0.50 07/03/2015 0851   BILITOT 0.7 06/27/2015 1812     Impression and Plan: Ms. Ehrsam is a pleasant 65 yo white female with metastatic adenocarcinoma of the lung. She has no mutations that we can target.  I would have to think that by her parents, that treatment is working.  We will go ahead and get her scan after this fourth cycle. I will give her next every week off so that she can recover just a little bit.  She will continue her radiation .  I will plan to see her back in 3 weeks.  I spent about 30 minutes with she and her husband. As always, we had excellent fellowship.    Volanda Napoleon, MD 5/26/201710:08 AM

## 2015-07-07 ENCOUNTER — Ambulatory Visit
Admission: RE | Admit: 2015-07-07 | Discharge: 2015-07-07 | Disposition: A | Discharge status: Home / Self Care | Payer: PPO | Source: Ambulatory Visit | Attending: Radiation Oncology | Admitting: Radiation Oncology

## 2015-07-07 DIAGNOSIS — C7951 Secondary malignant neoplasm of bone: Secondary | ICD-10-CM | POA: Diagnosis not present

## 2015-07-07 DIAGNOSIS — Z510 Encounter for antineoplastic radiation therapy: Secondary | ICD-10-CM | POA: Diagnosis not present

## 2015-07-08 ENCOUNTER — Ambulatory Visit
Admission: RE | Admit: 2015-07-08 | Discharge: 2015-07-08 | Disposition: A | Discharge status: Home / Self Care | Payer: PPO | Source: Ambulatory Visit | Attending: Radiation Oncology | Admitting: Radiation Oncology

## 2015-07-08 ENCOUNTER — Encounter: Payer: Self-pay | Admitting: Radiation Oncology

## 2015-07-08 VITALS — BP 124/84 | HR 92 | Temp 98.3°F | Ht 61.0 in | Wt 126.9 lb

## 2015-07-08 DIAGNOSIS — C7951 Secondary malignant neoplasm of bone: Secondary | ICD-10-CM | POA: Diagnosis not present

## 2015-07-08 DIAGNOSIS — Z510 Encounter for antineoplastic radiation therapy: Secondary | ICD-10-CM | POA: Diagnosis not present

## 2015-07-08 MED ORDER — SONAFINE EX EMUL
1.0000 "application " | Freq: Once | CUTANEOUS | Status: AC
  Administered 2015-07-08: 1 via TOPICAL

## 2015-07-08 NOTE — Progress Notes (Signed)
Pt here for patient teaching.  Pt given Radiation and You booklet and Sonafine. Reviewed areas of pertinence such as fatigue, mouth changes and skin changes . Pt able to give teach back of to pat skin, use unscented/gentle soap and drink plenty of water,apply Sonafine bid and avoid applying anything to skin within 4 hours of treatment. Pt demonstrated understanding and verbalizes understanding of information given and will contact nursing with any questions or concerns.

## 2015-07-08 NOTE — Progress Notes (Signed)
[name] has completed 4 fractions to her C Spine.  She denies pain today except for tightness around her neck.  She reports having pain in her neck and left shoulder.  She takes oxycontin 10 mg as needed.  She had chemotherapy on Friday.  She reports having itching on her arms, legs and back that started 5 days ago and is worse now.  She is wondering if it is from her pain medication.  She has noticed a change in her voice.  She said this started before radiation.  BP 124/84 mmHg  Pulse 92  Temp(Src) 98.3 F (36.8 C) (Oral)  Ht '5\' 1"'$  (1.549 m)  Wt 126 lb 14.4 oz (57.561 kg)  BMI 23.99 kg/m2  SpO2 99%   Wt Readings from Last 3 Encounters:  07/08/15 126 lb 14.4 oz (57.561 kg)  07/03/15 126 lb 12.8 oz (57.516 kg)  06/25/15 124 lb 9.6 oz (56.518 kg)

## 2015-07-08 NOTE — Progress Notes (Signed)
  Radiation Oncology         [phone] ________________________________  Name: [name] MRN: [id number]  Date: 07/08/2015  DOB: 06-05-1950  Weekly Radiation Therapy Management   ICD-9-CM ICD-10-CM   1. Bone metastasis (HCC) 198.5 C79.51     Current Dose: 12 Gy     Planned Dose:  30 Gy  Narrative . . . . . . . . The patient presents for routine under treatment assessment.                                   [name] has completed 4 fractions to her Cervical Spine. She reports having pain and tightness in her neck and pain in her left shoulder. She takes oxycontin 10 mg as needed. She had chemotherapy on Friday. She reports having pruritus of her arms, legs, and back that started 5 days ago and is worse now. She is wondering if it is from her pain medication. She has noticed a change in her voice. She said this started before radiation.                                 Set-up films were reviewed.                                 The chart was checked. Physical Findings. . .  height is '5\' 1"'$  (1.549 m) and weight is 126 lb 14.4 oz (57.561 kg). Her oral temperature is 98.3 F (36.8 C). Her blood pressure is 124/84 and her pulse is 92. Her oxygen saturation is 99%. . Weight essentially stable. Lungs are clear to auscultation bilaterally. Heart has regular rate and rhythm. Oral cavity is clear without a secondary infection. No significant skin reaction in the neck. Impression . . . . . . . The patient is tolerating radiation. Plan . . . . . . . . . . . . Continue treatment as planned. I advised the patient to take oral Benadryl for the pruritus. Overall her pain is already improved in the neck and shoulder region.  ________________________________   Blair Promise, PhD, MD  This document serves as a record of services personally performed by Gery Pray, MD. It was created on his behalf by Darcus Austin, a trained medical scribe. The creation of this record is based on the scribe's  personal observations and the provider's statements to them. This document has been checked and approved by the attending provider.

## 2015-07-09 ENCOUNTER — Ambulatory Visit
Admission: RE | Admit: 2015-07-09 | Discharge: 2015-07-09 | Disposition: A | Discharge status: Home / Self Care | Payer: PPO | Source: Ambulatory Visit | Attending: Radiation Oncology | Admitting: Radiation Oncology

## 2015-07-09 ENCOUNTER — Encounter: Payer: Self-pay | Admitting: Radiation Oncology

## 2015-07-09 VITALS — BP 126/91 | HR 88 | Temp 98.3°F | Ht 61.0 in | Wt 128.9 lb

## 2015-07-09 DIAGNOSIS — Z510 Encounter for antineoplastic radiation therapy: Secondary | ICD-10-CM | POA: Diagnosis not present

## 2015-07-09 DIAGNOSIS — C7951 Secondary malignant neoplasm of bone: Secondary | ICD-10-CM

## 2015-07-09 MED ORDER — GABAPENTIN 300 MG PO CAPS: 300.0000 mg | ORAL_CAPSULE | Freq: Three times a day (TID) | ORAL | Status: DC

## 2015-07-09 NOTE — Progress Notes (Signed)
Taylynn Tse has completed 5 fractions to her C Spine.  She reports feeling weak and has pain in her right shoulder that radiates down her arm.  She is rating the pain at an 8/10 when still and worse with movement.  She has had the pain before and is trying not to take her pain medication (oxycontin 10 mg and oxycodone 5 mg) because it makes her "crazy headed."  She reports almost falling last night when going in her house and scrapped her right wrist.  Orthostatic vitals taken:  bp sitting 126/91, hr 88, bp standing 126/98, hr 92.  She reports she is eating and drinking.  BP 126/91 mmHg  Pulse 88  Temp(Src) 98.3 F (36.8 C) (Oral)  Ht '5\' 1"'$  (1.549 m)  Wt 128 lb 14.4 oz (58.469 kg)  BMI 24.37 kg/m2  SpO2 99%   Wt Readings from Last 3 Encounters:  07/09/15 128 lb 14.4 oz (58.469 kg)  07/08/15 126 lb 14.4 oz (57.561 kg)  07/03/15 126 lb 12.8 oz (57.516 kg)

## 2015-07-09 NOTE — Progress Notes (Signed)
  Radiation Oncology         [phone] ________________________________  Name: [name] MRN: [id number]  Date: 07/09/2015  DOB: 03-11-1950  Weekly Radiation Therapy Management    ICD-9-CM ICD-10-CM   1. Bone metastasis (HCC) 198.5 C79.51     Current Dose: 15 Gy     Planned Dose:  30 Gy  Narrative . . . . . . . . The patient presents for routine under treatment assessment.                                   [name] has completed 5 fractions to her C Spine. She reports feeling weak and has pain in her left shoulder that radiates down her arm. She is rating the pain at an 8/10 when still and worse with movement. She has had the pain before and is trying not to take her pain medication (oxycontin 10 mg and oxycodone 5 mg) because it makes her "crazy headed." She reports almost falling last night when going in her house and scrapped her right wrist. Orthostatic vitals taken: bp sitting 126/91, hr 88, bp standing 126/98, hr 92. She reports she is eating and drinking.                                 Set-up films were reviewed.                                 The chart was checked. Physical Findings. . .  height is '5\' 1"'$  (1.549 m) and weight is 128 lb 14.4 oz (58.469 kg). Her oral temperature is 98.3 F (36.8 C). Her blood pressure is 126/91 and her pulse is 88. Her oxygen saturation is 99%. . Weight essentially stable.  No significant changes. Exam is performed in the patient appears to have radicular pain extending from the neck into the left shoulder and arm region Impression . . . . . . . The patient is tolerating radiation. Plan . . . . . . . . . . . . Continue treatment as planned.  Will add Neurontin to her pain medication regimen to see if this will help.  ________________________________   Blair Promise, PhD, MD

## 2015-07-10 ENCOUNTER — Ambulatory Visit
Admission: RE | Admit: 2015-07-10 | Discharge: 2015-07-10 | Disposition: A | Discharge status: Home / Self Care | Payer: PPO | Source: Ambulatory Visit | Attending: Radiation Oncology | Admitting: Radiation Oncology

## 2015-07-10 DIAGNOSIS — Z510 Encounter for antineoplastic radiation therapy: Secondary | ICD-10-CM | POA: Diagnosis not present

## 2015-07-10 DIAGNOSIS — C7951 Secondary malignant neoplasm of bone: Secondary | ICD-10-CM | POA: Diagnosis not present

## 2015-07-12 DIAGNOSIS — R05 Cough: Secondary | ICD-10-CM | POA: Diagnosis not present

## 2015-07-12 DIAGNOSIS — J0100 Acute maxillary sinusitis, unspecified: Secondary | ICD-10-CM | POA: Diagnosis not present

## 2015-07-13 ENCOUNTER — Ambulatory Visit
Admission: RE | Admit: 2015-07-13 | Discharge: 2015-07-13 | Disposition: A | Discharge status: Home / Self Care | Payer: PPO | Source: Ambulatory Visit | Attending: Radiation Oncology | Admitting: Radiation Oncology

## 2015-07-13 DIAGNOSIS — C7951 Secondary malignant neoplasm of bone: Secondary | ICD-10-CM | POA: Diagnosis not present

## 2015-07-13 DIAGNOSIS — Z510 Encounter for antineoplastic radiation therapy: Secondary | ICD-10-CM | POA: Diagnosis not present

## 2015-07-14 ENCOUNTER — Ambulatory Visit
Admission: RE | Admit: 2015-07-14 | Discharge: 2015-07-14 | Disposition: A | Discharge status: Home / Self Care | Payer: PPO | Source: Ambulatory Visit | Attending: Radiation Oncology | Admitting: Radiation Oncology

## 2015-07-14 ENCOUNTER — Encounter: Payer: Self-pay | Admitting: Radiation Oncology

## 2015-07-14 VITALS — BP 112/86 | HR 100 | Temp 98.5°F | Ht 61.0 in | Wt 126.3 lb

## 2015-07-14 DIAGNOSIS — C7951 Secondary malignant neoplasm of bone: Secondary | ICD-10-CM | POA: Diagnosis not present

## 2015-07-14 DIAGNOSIS — C3490 Malignant neoplasm of unspecified part of unspecified bronchus or lung: Secondary | ICD-10-CM

## 2015-07-14 DIAGNOSIS — Z510 Encounter for antineoplastic radiation therapy: Secondary | ICD-10-CM | POA: Diagnosis not present

## 2015-07-14 MED ORDER — LIDOCAINE VISCOUS 2 % MT SOLN: 15.0000 mL | OROMUCOSAL | Status: DC | PRN

## 2015-07-14 NOTE — Progress Notes (Signed)
[name] is here for her 8th fraction of radiation to her C-Spine. She reports pain a 6/10 in her throat only when she swallows. She states when she takes her medicine, it does not go all the way down, and she starts to cough and gag. She started an antibiotic (Levaquin) this past Sunday for a sinus infection. She reports she feels very tired and "shakey" at times during the day, especially when she is in a hurry. The skin to her radiation site is normal appearing per her report, and she is using the sonafine cream twice daily. She has a good appetite, but reports constipation because of her pain medicines and requires a laxative to help her have a bowel movement. She is not taking her oxycontin every 12 hours and I have encouraged her to take it every 12 hours as it is an extended release medicine. She also takes 1 or 2 oxycodone daily for breakthrough pain.   BP 112/86 mmHg  Pulse 100  Temp(Src) 98.5 F (36.9 C)  Ht '5\' 1"'$  (1.549 m)  Wt 126 lb 4.8 oz (57.289 kg)  BMI 23.88 kg/m2  SpO2 100%   Wt Readings from Last 3 Encounters:  07/14/15 126 lb 4.8 oz (57.289 kg)  07/09/15 128 lb 14.4 oz (58.469 kg)  07/08/15 126 lb 14.4 oz (57.561 kg)

## 2015-07-14 NOTE — Progress Notes (Signed)
  Radiation Oncology         [phone] ________________________________  Name: [name] MRN: [id number]  Date: 07/14/2015  DOB: 1950-11-27  Weekly Radiation Therapy Management    ICD-9-CM ICD-10-CM   1. Bone metastasis (HCC) 198.5 C79.51     Current Dose: 24 Gy     Planned Dose:  30 Gy  Narrative . . . . . . . . The patient presents for routine under treatment assessment.                                   [name] is here for her 8th fraction of radiation to her C-Spine. She reports pain a 6/10 in her throat only when she swallows. She states when she takes her medicine, it does not go all the way down, and she starts to cough and gag. She started an antibiotic (Levaquin) this past Sunday for a sinus infection. She reports she feels very tired and "shakey" at times during the day, especially when she is in a hurry. The skin to her radiation site is normal appearing per her report, and she is using the sonafine cream twice daily. She has a good appetite, but reports constipation because of her pain medicines and requires a laxative to help her have a bowel movement. She is not taking her oxycontin every 12 hours and I have encouraged her to take it every 12 hours as it is an extended release medicine. She also takes 1 or 2 oxycodone daily for breakthrough pain.                                  Set-up films were reviewed.                                 The chart was checked. Physical Findings. . .  height is '5\' 1"'$  (1.549 m) and weight is 126 lb 4.8 oz (57.289 kg). Her temperature is 98.5 F (36.9 C). Her blood pressure is 112/86 and her pulse is 100. Her oxygen saturation is 100%. . No significant skin reaction along the neck area. Impression . . . . . . . The patient is tolerating radiation. Plan . . . . . . . . . . . . Continue treatment as planned. I gave her a prescription for xylocaine.  ________________________________   Blair Promise, PhD, MD    This document serves as  a record of services personally performed by Gery Pray, MD. It was created on his behalf by Lendon Collar, a trained medical scribe. The creation of this record is based on the scribe's personal observations and the provider's statements to them. This document has been checked and approved by the attending provider.

## 2015-07-15 ENCOUNTER — Ambulatory Visit
Admission: RE | Admit: 2015-07-15 | Discharge: 2015-07-15 | Disposition: A | Discharge status: Home / Self Care | Payer: PPO | Source: Ambulatory Visit | Attending: Radiation Oncology | Admitting: Radiation Oncology

## 2015-07-15 DIAGNOSIS — Z510 Encounter for antineoplastic radiation therapy: Secondary | ICD-10-CM | POA: Diagnosis not present

## 2015-07-15 DIAGNOSIS — C7951 Secondary malignant neoplasm of bone: Secondary | ICD-10-CM | POA: Diagnosis not present

## 2015-07-16 ENCOUNTER — Encounter: Payer: Self-pay | Admitting: Radiation Oncology

## 2015-07-16 ENCOUNTER — Ambulatory Visit
Admission: RE | Admit: 2015-07-16 | Discharge: 2015-07-16 | Disposition: A | Discharge status: Home / Self Care | Payer: PPO | Source: Ambulatory Visit | Attending: Radiation Oncology | Admitting: Radiation Oncology

## 2015-07-16 DIAGNOSIS — Z510 Encounter for antineoplastic radiation therapy: Secondary | ICD-10-CM | POA: Diagnosis not present

## 2015-07-16 DIAGNOSIS — C7951 Secondary malignant neoplasm of bone: Secondary | ICD-10-CM | POA: Diagnosis not present

## 2015-07-17 ENCOUNTER — Ambulatory Visit: Payer: PPO

## 2015-07-20 ENCOUNTER — Ambulatory Visit: Payer: PPO

## 2015-07-20 DIAGNOSIS — C7951 Secondary malignant neoplasm of bone: Secondary | ICD-10-CM | POA: Insufficient documentation

## 2015-07-20 DIAGNOSIS — C782 Secondary malignant neoplasm of pleura: Secondary | ICD-10-CM | POA: Diagnosis not present

## 2015-07-20 DIAGNOSIS — C3492 Malignant neoplasm of unspecified part of left bronchus or lung: Secondary | ICD-10-CM | POA: Diagnosis not present

## 2015-07-20 DIAGNOSIS — C3490 Malignant neoplasm of unspecified part of unspecified bronchus or lung: Secondary | ICD-10-CM | POA: Diagnosis not present

## 2015-07-20 DIAGNOSIS — M899 Disorder of bone, unspecified: Secondary | ICD-10-CM | POA: Diagnosis not present

## 2015-07-20 LAB — GLUCOSE, CAPILLARY: Glucose-Capillary: 89 mg/dL (ref 65–99)

## 2015-07-20 MED ORDER — FLUDEOXYGLUCOSE F - 18 (FDG) INJECTION
6.2300 | Freq: Once | INTRAVENOUS | Status: AC | PRN
  Administered 2015-07-20: 6.23 via INTRAVENOUS

## 2015-07-21 ENCOUNTER — Ambulatory Visit: Payer: PPO

## 2015-07-21 ENCOUNTER — Telehealth: Payer: Self-pay | Admitting: Oncology

## 2015-07-21 NOTE — Telephone Encounter (Signed)
Left a message for [facility] regarding her call to triage for a sore throat.  Requested a return call.

## 2015-07-22 ENCOUNTER — Other Ambulatory Visit: Payer: Self-pay | Admitting: Hematology & Oncology

## 2015-07-22 ENCOUNTER — Ambulatory Visit
Admission: RE | Admit: 2015-07-22 | Discharge: 2015-07-22 | Disposition: A | Discharge status: Home / Self Care | Payer: PPO | Source: Ambulatory Visit | Attending: Radiation Oncology | Admitting: Radiation Oncology

## 2015-07-22 ENCOUNTER — Encounter: Payer: Self-pay | Admitting: Radiation Oncology

## 2015-07-22 ENCOUNTER — Telehealth: Payer: Self-pay | Admitting: Oncology

## 2015-07-22 ENCOUNTER — Ambulatory Visit (HOSPITAL_BASED_OUTPATIENT_CLINIC_OR_DEPARTMENT_OTHER): Payer: PPO

## 2015-07-22 ENCOUNTER — Ambulatory Visit: Payer: PPO

## 2015-07-22 ENCOUNTER — Other Ambulatory Visit: Payer: Self-pay | Admitting: Oncology

## 2015-07-22 ENCOUNTER — Ambulatory Visit
Admission: RE | Admit: 2015-07-22 | Discharge: 2015-07-22 | Disposition: A | Discharge status: Home / Self Care | Payer: PPO | Source: Ambulatory Visit | Attending: Hematology & Oncology | Admitting: Hematology & Oncology

## 2015-07-22 VITALS — BP 147/98 | HR 86 | Temp 98.5°F | Resp 18

## 2015-07-22 VITALS — BP 142/88 | HR 82 | Temp 98.4°F | Ht 61.0 in | Wt 128.5 lb

## 2015-07-22 DIAGNOSIS — M542 Cervicalgia: Secondary | ICD-10-CM

## 2015-07-22 DIAGNOSIS — C7951 Secondary malignant neoplasm of bone: Secondary | ICD-10-CM

## 2015-07-22 DIAGNOSIS — C3490 Malignant neoplasm of unspecified part of unspecified bronchus or lung: Secondary | ICD-10-CM | POA: Diagnosis not present

## 2015-07-22 DIAGNOSIS — C3492 Malignant neoplasm of unspecified part of left bronchus or lung: Secondary | ICD-10-CM | POA: Diagnosis not present

## 2015-07-22 DIAGNOSIS — Z452 Encounter for adjustment and management of vascular access device: Secondary | ICD-10-CM | POA: Diagnosis not present

## 2015-07-22 DIAGNOSIS — G43911 Migraine, unspecified, intractable, with status migrainosus: Secondary | ICD-10-CM

## 2015-07-22 LAB — CBC WITH DIFFERENTIAL/PLATELET
BASO%: 1.4 % (ref 0.0–2.0)
BASOS ABS: 0.1 10*3/uL (ref 0.0–0.1)
EOS ABS: 0.1 10*3/uL (ref 0.0–0.5)
EOS%: 1.9 % (ref 0.0–7.0)
HCT: 37.5 % (ref 34.8–46.6)
HEMOGLOBIN: 12.2 g/dL (ref 11.6–15.9)
LYMPH%: 25.4 % (ref 14.0–49.7)
MCH: 27.4 pg (ref 25.1–34.0)
MCHC: 32.5 g/dL (ref 31.5–36.0)
MCV: 84.1 fL (ref 79.5–101.0)
MONO#: 0.7 10*3/uL (ref 0.1–0.9)
MONO%: 10 % (ref 0.0–14.0)
NEUT#: 4.5 10*3/uL (ref 1.5–6.5)
NEUT%: 61.3 % (ref 38.4–76.8)
Platelets: 280 10*3/uL (ref 145–400)
RBC: 4.46 10*6/uL (ref 3.70–5.45)
RDW: 16.9 % — ABNORMAL HIGH (ref 11.2–14.5)
WBC: 7.4 10*3/uL (ref 3.9–10.3)
lymph#: 1.9 10*3/uL (ref 0.9–3.3)

## 2015-07-22 LAB — BASIC METABOLIC PANEL
ANION GAP: 7 meq/L (ref 3–11)
BUN: 9.8 mg/dL (ref 7.0–26.0)
CALCIUM: 9.1 mg/dL (ref 8.4–10.4)
CO2: 27 meq/L (ref 22–29)
Chloride: 106 mEq/L (ref 98–109)
Creatinine: 0.6 mg/dL (ref 0.6–1.1)
Glucose: 84 mg/dl (ref 70–140)
Potassium: 4 mEq/L (ref 3.5–5.1)
SODIUM: 140 meq/L (ref 136–145)

## 2015-07-22 MED ORDER — OXYCODONE HCL 20 MG/ML PO CONC: 6.0000 mg | ORAL | Status: DC | PRN

## 2015-07-22 MED ORDER — HEPARIN SOD (PORK) LOCK FLUSH 100 UNIT/ML IV SOLN
500.0000 [IU] | Freq: Once | INTRAVENOUS | Status: AC | PRN
  Administered 2015-07-22: 500 [IU] via INTRAVENOUS
  Filled 2015-07-22: qty 5

## 2015-07-22 MED ORDER — MORPHINE SULFATE (PF) 4 MG/ML IV SOLN
INTRAVENOUS | Status: AC
  Filled 2015-07-22: qty 1

## 2015-07-22 MED ORDER — MORPHINE SULFATE 4 MG/ML IJ SOLN
2.0000 mg | INTRAMUSCULAR | Status: AC | PRN
Start: 2015-07-22 — End: ?
  Administered 2015-07-22: 2 mg via INTRAVENOUS
  Filled 2015-07-22: qty 1

## 2015-07-22 MED ORDER — SODIUM CHLORIDE 0.9 % IV SOLN
1000.0000 mL | Freq: Once | INTRAVENOUS | Status: AC
  Administered 2015-07-22: 1000 mL via INTRAVENOUS

## 2015-07-22 MED ORDER — SODIUM CHLORIDE 0.9 % IJ SOLN
10.0000 mL | INTRAMUSCULAR | Status: DC | PRN
  Administered 2015-07-22: 10 mL via INTRAVENOUS
  Filled 2015-07-22: qty 10

## 2015-07-22 NOTE — Progress Notes (Signed)
[name] here for follow up.  She reports having pain today due to a headache that she is rating at a 7/10.  She had a dose of morphine in infusion today that did not help.  She also took Imitrex.  She reports having burning pain in her throat with swallowing that started on Friday.  She reports everything she drinks gets stuck in her throat.  She has not been able to swallow any of her pills including her pain medication.  She has been taking lidocaine 3-4 times daily along with chloroseptic spray.  She reports it gages her.  She continues to have a dry cough and recently had a sinus infection.  She was put on clarithromycin and has not been able to finish it because she can't swallow the pills.  She does not have any skin irritation in the treatment area.  She reports feeling fatigued.  BP 142/88 mmHg  Pulse 82  Temp(Src) 98.4 F (36.9 C) (Oral)  Ht '5\' 1"'$  (1.549 m)  Wt 128 lb 8 oz (58.287 kg)  BMI 24.29 kg/m2  SpO2 97%   Wt Readings from Last 3 Encounters:  07/22/15 128 lb 8 oz (58.287 kg)  07/14/15 126 lb 4.8 oz (57.289 kg)  07/09/15 128 lb 14.4 oz (58.469 kg)

## 2015-07-22 NOTE — Progress Notes (Signed)
Radiation Oncology         [phone] ________________________________  Name: [name] MRN: [id number]  Date: 07/22/2015  DOB: [birth year]  Follow-Up Visit Note  CC: Myrlene Broker, MD  Volanda Napoleon, MD    ICD-9-CM ICD-10-CM   1. Lung cancer, primary, with metastasis from lung to other site, unspecified laterality ([place]) 162.9 C34.90     Diagnosis:  Metastatic adenocarcinoma of the lung   Interval Since Last Radiation:  5  days  Narrative:  The patient Requested to be seen today. She is having significant difficulties with swallowing. She complains of not being able to swallow her pills and soft or solid foods. She can get some liquids down although slowly.  Patient earlier today underwent IV fluid supplementation. CBC and bmet were obtained showing no significant abnormalities.                           ALLERGIES:  is allergic to demerol; mefoxin; topamax; and avelox.  Meds: Current Outpatient Prescriptions  Medication Sig Dispense Refill  . aspirin 81 MG tablet Take 81 mg by mouth daily.      . budesonide-formoterol (SYMBICORT) 160-4.5 MCG/ACT inhaler Inhale 2 puffs into the lungs 2 (two) times daily.    . calcium-vitamin D (OSCAL) 250-125 MG-UNIT per tablet Take 1 tablet by mouth daily.      . Cholecalciferol (VITAMIN D) 2000 UNITS tablet Take 2,000 Units by mouth daily.    . clarithromycin (BIAXIN) 500 MG tablet Take 500 mg by mouth 2 (two) times daily. Reported on 07/08/2015  0  . Echinacea 400 MG CAPS Take 400 mg by mouth daily.    . fish oil-omega-3 fatty acids 1000 MG capsule Take 2 g by mouth daily.      . folic acid (FOLVITE) 1 MG tablet Take 1 mg by mouth daily.    Marland Kitchen KLOR-CON M20 20 MEQ tablet TAKE 1 TABLET BY MOUTH DAILY (Patient taking differently: TAKE 20 MEQ BY MOUTH DAILY) 30 tablet 3  . lidocaine (XYLOCAINE) 2 % solution Use as directed 15 mLs in the mouth or throat as needed for mouth pain. 100 mL 0  . omeprazole (PRILOSEC) 40 MG capsule Take 40 mg  by mouth 2 (two) times daily.   3  . oxyCODONE (OXYCONTIN) 10 mg 12 hr tablet Take 1 tablet (10 mg total) by mouth every 12 (twelve) hours. 60 tablet 0  . polyethylene glycol powder (GLYCOLAX/MIRALAX) powder Take 17 g by mouth once. Dissolve 1 capful of powder into any liquid and take once daily. Can increase to 2 times daily if no effect after 3-4 days. (Patient taking differently: Take 17 g by mouth daily. Dissolve 1 capful of powder into any liquid and take once daily. Can increase to 2 times daily if no effect after 3-4 days.) 500 g 0  . PROAIR RESPICLICK 756 (90 BASE) MCG/ACT AEPB Inhale 2 puffs into the lungs twice daily as needed for shortness of breath or wheezing  0  . SUMAtriptan (IMITREX) 100 MG tablet Take 100 mg by mouth as needed for migraine or headache. Reported on 04/01/2015    . Wound Dressings (SONAFINE EX) Apply topically.    . gabapentin (NEURONTIN) 300 MG capsule Take 1 capsule (300 mg total) by mouth 3 (three) times daily. (Patient not taking: Reported on 07/22/2015) 30 capsule 1  . lactulose (CHRONULAC) 10 GM/15ML solution Take 10 g by mouth 3 (three) times daily. Reported on  07/22/2015    . oxyCODONE (OXY IR/ROXICODONE) 5 MG immediate release tablet Take 1-2 pills, if needed, every 4 hours for pain secondary to cancer. (Patient not taking: Reported on 07/22/2015) 120 tablet 0  . oxyCODONE (ROXICODONE INTENSOL) 20 MG/ML concentrated solution Take 0.3 mLs (6 mg total) by mouth every 4 (four) hours as needed for severe pain. 15 mL 0  . prochlorperazine (COMPAZINE) 10 MG tablet Take 10 mg by mouth every 6 (six) hours as needed for nausea or vomiting.     . senna-docusate (SENOKOT-S) 8.6-50 MG tablet Take 1 tablet by mouth daily. (Patient not taking: Reported on 07/22/2015) 30 tablet 0  . [DISCONTINUED] citalopram (CELEXA) 20 MG tablet Take 1 tablet (20 mg total) by mouth daily. (Patient not taking: Reported on 04/01/2015) 30 tablet 6   No current facility-administered medications for  this encounter.   Facility-Administered Medications Ordered in Other Encounters  Medication Dose Route Frequency Provider Last Rate Last Dose  . morphine 4 MG/ML injection 2 mg  2 mg Intravenous Q2H PRN Volanda Napoleon, MD   2 mg at 07/22/15 1418  . sodium chloride 0.9 % injection 10 mL  10 mL Intravenous PRN Golden Pop, FNP   10 mL at 12/06/13 1149  . sodium chloride 0.9 % injection 10 mL  10 mL Intravenous PRN Golden Pop, FNP   10 mL at 07/22/15 1545    Physical Findings: The patient is in no acute distress. Patient is alert and oriented.  height is '5\' 1"'$  (1.549 m) and weight is 128 lb 8 oz (58.287 kg). Her oral temperature is 98.4 F (36.9 C). Her blood pressure is 142/88 and her pulse is 82. Her oxygen saturation is 97%. . The oral cavity is moist without secondary infection. The patient has some hyperpigmentation changes and erythema in the mid neck from her radiation therapy. No skin breakdown. The lungs are clear. The heart has a regular rhythm and rate. The abdomen is soft and nontender with normal bowel sounds.  Lab Findings: Lab Results  Component Value Date   WBC 7.4 07/22/2015   HGB 12.2 07/22/2015   HCT 37.5 07/22/2015   MCV 84.1 07/22/2015   PLT 280 07/22/2015    Radiographic Findings: Ct Abdomen Pelvis W Contrast  06/27/2015  CLINICAL DATA:  Abdominal pain. The patient is being treated for lung cancer. EXAM: CT ABDOMEN AND PELVIS WITH CONTRAST TECHNIQUE: Multidetector CT imaging of the abdomen and pelvis was performed using the standard protocol following bolus administration of intravenous contrast. CONTRAST:  170m ISOVUE-300 IOPAMIDOL (ISOVUE-300) INJECTION 61% COMPARISON:  Chest CT January 11, 2015 and PET-CT May 08, 2014 FINDINGS: Chronic changes consistent with the patient's history are seen in the lingula, medial left lower lobe with masslike opacities. There is also right infrahilar opacity and a peripheral nodule on series 4, images 2 and 5. These  findings are also stable. There is volume loss on the left. There is a hiatal hernia. No other abnormalities are seen within the chest. No free air or free fluid. There is elevation of the left hemidiaphragm. The patient is status post cholecystectomy. There is resulting prominence of the common bile duct which is within normal limits. The liver all, spleen, adrenal glands, pancreas, and kidneys are normal. Atherosclerotic changes are seen in the non aneurysmal aorta. No adenopathy is seen within the abdomen. Other than the hiatal hernia, the stomach and small bowel are normal. The colon demonstrates fecal loading but no evidence of colitis or  inflammation. Fecal loading is most prominent in the cecum which extends into the right side of the pelvis. The patient is status post appendectomy. The pelvis demonstrates a mildly prominent cecum filled with stool. No adenopathy. The patient is status post hysterectomy. The bladder is normal. Widespread bony metastatic disease is seen throughout all visualized bones. No filling defects in the upper renal collecting system. IMPRESSION: 1. Chronic changes consistent with the history of lung cancer in the lung bases. 2. Moderate fecal loading throughout the colon, particularly in the cecum which extends into the pelvis. 3. No acute abnormality in the abdomen. 4. Widespread bony metastatic disease. Electronically Signed   By: Dorise Bullion III M.D   On: 06/27/2015 21:49   Nm Pet Image Restag (ps) Skull Base To Thigh  07/20/2015  CLINICAL DATA:  Subsequent treatment strategy for metastatic lung cancer. On chemotherapy. EXAM: NUCLEAR MEDICINE PET SKULL BASE TO THIGH TECHNIQUE: 6.21 mCi F-18 FDG was injected intravenously. Full-ring PET imaging was performed from the skull base to thigh after the radiotracer. CT data was obtained and used for attenuation correction and anatomic localization. FASTING BLOOD GLUCOSE:  Value: 89 mg/dl COMPARISON:  PET-CT 05/08/2015. CT the abdomen  and pelvis 06/27/2015. CT of the chest 04/04/2015. Multiple other priors. FINDINGS: NECK 1 cm right-sided level 2 lymph node (image 21 of series 4) is hypermetabolic (SUVmax = 5.7), similar to the prior study. CHEST There again multiple pulmonary nodules and masses scattered throughout the lungs bilaterally, the largest of which measure up to 3.6 x 2.4 cm (image 38 of series 8) in the inferior segment of the lingula, and is hypermetabolic (SUVmax = 3.6). There is also some pleural soft tissue in the medial aspect of the lower left hemithorax which measures up to 4.0 x 4.1 cm (image 75 of series 4) and is also hypermetabolic (SUVmax = 3.7-9.0). Multiple other areas of septal thickening and nodularity are noted throughout the lungs bilaterally, with varying degrees of hypermetabolism, compatible with additional areas of metastatic disease, some of which appears to represent developing lymphangitic spread. Left hilar hypermetabolism (SUVmax = 7.8), and subcarinal hypermetabolism (SUVmax = 4.8), similar to the prior study. 11 mm hypermetabolic (SUVmax = 6.9) prevascular lymph node (image 54 of series 4), slightly smaller and less hypermetabolic than the prior study. Right internal jugular single-lumen porta cath with tip terminating at the superior cavoatrial junction. Heart size is normal. There is no significant pericardial fluid, thickening or pericardial calcification. There is atherosclerosis of the thoracic aorta, the great vessels of the mediastinum and the coronary arteries, including calcified atherosclerotic plaque in the left main, left anterior descending and left circumflex coronary arteries. Moderate sized hiatal hernia. ABDOMEN/PELVIS No abnormal hypermetabolic activity within the liver, pancreas, adrenal glands, or spleen. No hypermetabolic lymph nodes in the abdomen or pelvis. Status post cholecystectomy. Atherosclerosis throughout the abdominal and pelvic vasculature, without definite aneurysm. No  significant volume of ascites. No pneumoperitoneum. No pathologic dilatation of small bowel or colon. A few scattered colonic diverticulae are noted, without surrounding inflammatory changes to suggest an acute diverticulitis at this time. SKELETON Widespread mixed lytic and sclerotic lesions are noted throughout the visualized axial and appendicular skeleton, most of which demonstrate hypermetabolism. Overall, compared to the prior study, the extensive metastatic disease appears increased. This is particularly evident in the anatomic pelvis where there are an increased number and size of numerous lesions, and increasing hypermetabolism throughout the pelvis bilaterally. Additionally, there are multiple new rib lesions bilaterally. IMPRESSION: 1. Overall, today's  study demonstrates progression of disease, predominantly based upon increased number and size of numerous hypermetabolic mixed lytic and sclerotic osseous lesions throughout the axial and appendicular skeleton. Previously noted multifocal pulmonary and pleural metastases are generally very similar to the prior study, as above. Lymphatic involvement appears generally stable, with exception of slight regression of one of the previously noted prevascular lymph nodes. 2. Additional incidental findings, as above. Electronically Signed   By: Vinnie Langton M.D.   On: 07/20/2015 12:00    Impression:  Metastatic adenocarcinoma to the cervical spine. Patient has recently completed palliative radiation therapy to this area. She has significant pharyngitis from her radiation therapy. Patient has been given a prescription for Roxicodone in liquid form. As above the patient underwent IV fluid supplementation earlier today.  Plan:  Patient will meet with Dr. Marin Olp in 2 days for further evaluation and discussion of her recent PET scan.  She is to call me if she feels she may need additional IV fluids over the next several days. I anticipate that her pain will  improve the near future.  She knows to take nutritional supplements to help with her nutritional status while she is unable to solid foods.  ____________________________________ Gery Pray, MD

## 2015-07-22 NOTE — Patient Instructions (Signed)

## 2015-07-22 NOTE — Telephone Encounter (Signed)
Called [name] to see how she is doing.  She said her throat is still sore and the lidocaine does not help.  She said it feels like the burning is farther down her throat and the lidocaine does not touch it.  She is not able to swallow her pain pills and said she is not eating or drinking very much and is concerned about dehydration.  She said her legs have been aching.  She has been eating soup and chocolate pudding.  She drank 1/2 a cup of coffee this morning and 1/2 Dr. Malachi Bonds and a bottle of water yesterday.  Advised her to push fluids today but to avoid drinks with caffeine.  She said she has Gatorade at home and will drink that today.  Also advised her that the lidocaine could be diluted to one part lidocaine (5 ml) and one part water (5 ml) to make it easier to swallow.  She said she had been doing this with liquid benadryl.  Advised her that Dr. Sondra Come will be notified of her continued throat pain and that we will call her back.

## 2015-07-22 NOTE — Progress Notes (Signed)
1335-Upon arrive to infusion center pt reports neck and head pain 6/10 described as an ache. " I think I am getting a migraine". Per Dr. Marin Olp Morphine given .  An hour later pain still "6 " and pt reports "it is a migraine.". She took her sumatriptan 1/2 tablet ( 50 mg). Curtain drawn to reduce light exposure.

## 2015-07-22 NOTE — Telephone Encounter (Signed)
Called Lauren, RN in infusion to see if [name] could receive IV fluids today.  Lauren said they can get her in anytime this afternoon. Called [name] back and asked if she could come in for labs and IV fluids today and then to see Dr. Sondra Come afterwards.  [name] said she could come in at 1:00.  Advised her that appointments will be made at 1:00 for labs, 1:30 for fluids and 3:30 to see Dr. Sondra Come.  [name] verbalized agreement and understanding.  Called Lauren back and advised her that [name] will come in at 1:30 for fluids.

## 2015-07-23 NOTE — Progress Notes (Incomplete)
°  Radiation Oncology         [phone] ________________________________  Name: [name] MRN: [id number]  Date: 07/16/2015  DOB: [birth year]  End of Treatment Note  Diagnosis: Metastatic adenocarcinoma of the lung      Indication for treatment:  Palliative        Radiation treatment dates:   07/03/15-07/16/15  Site/dose:   C3 Spine treated to 30 Gy in 10 fractions   Beams/energy:   3D / 6X   Narrative: The patient tolerated radiation treatment relatively well. The patient had significant pharyngitis from her radiation therapy. She was given Roxicodone and IV fluid supplementation.   Plan: The patient has completed radiation treatment. The patient will return to radiation oncology clinic for routine followup in one month. I advised them to call or return sooner if they have any questions or concerns related to their recovery or treatment.  -----------------------------------  Blair Promise, PhD, MD   This document serves as a record of services personally performed by Kara Pray, MD. It was created on his behalf by Derek Mound, a trained medical scribe. The creation of this record is based on the scribe's personal observations and the provider's statements to them. This document has been checked and approved by the attending provider.

## 2015-07-24 ENCOUNTER — Ambulatory Visit (HOSPITAL_BASED_OUTPATIENT_CLINIC_OR_DEPARTMENT_OTHER): Payer: PPO

## 2015-07-24 ENCOUNTER — Other Ambulatory Visit (HOSPITAL_BASED_OUTPATIENT_CLINIC_OR_DEPARTMENT_OTHER): Payer: PPO

## 2015-07-24 ENCOUNTER — Encounter: Payer: Self-pay | Admitting: Hematology & Oncology

## 2015-07-24 ENCOUNTER — Ambulatory Visit (HOSPITAL_BASED_OUTPATIENT_CLINIC_OR_DEPARTMENT_OTHER): Payer: PPO | Admitting: Hematology & Oncology

## 2015-07-24 VITALS — BP 152/97 | HR 84 | Temp 98.0°F | Resp 16 | Ht 61.0 in | Wt 127.0 lb

## 2015-07-24 DIAGNOSIS — J910 Malignant pleural effusion: Secondary | ICD-10-CM

## 2015-07-24 DIAGNOSIS — C3490 Malignant neoplasm of unspecified part of unspecified bronchus or lung: Secondary | ICD-10-CM

## 2015-07-24 DIAGNOSIS — C3492 Malignant neoplasm of unspecified part of left bronchus or lung: Secondary | ICD-10-CM | POA: Diagnosis not present

## 2015-07-24 DIAGNOSIS — C7951 Secondary malignant neoplasm of bone: Secondary | ICD-10-CM | POA: Diagnosis not present

## 2015-07-24 LAB — CMP (CANCER CENTER ONLY)
ALK PHOS: 98 U/L — AB (ref 26–84)
ALT: 17 U/L (ref 10–47)
AST: 21 U/L (ref 11–38)
Albumin: 3.5 g/dL (ref 3.3–5.5)
BUN, Bld: 7 mg/dL (ref 7–22)
CALCIUM: 8.3 mg/dL (ref 8.0–10.3)
CHLORIDE: 103 meq/L (ref 98–108)
CO2: 26 mEq/L (ref 18–33)
Creat: 0.5 mg/dl — ABNORMAL LOW (ref 0.6–1.2)
GLUCOSE: 95 mg/dL (ref 73–118)
POTASSIUM: 3.9 meq/L (ref 3.3–4.7)
Sodium: 136 mEq/L (ref 128–145)
Total Bilirubin: 0.6 mg/dl (ref 0.20–1.60)
Total Protein: 7.1 g/dL (ref 6.4–8.1)

## 2015-07-24 LAB — CBC WITH DIFFERENTIAL (CANCER CENTER ONLY)
BASO#: 0.1 10*3/uL (ref 0.0–0.2)
BASO%: 1.6 % (ref 0.0–2.0)
EOS ABS: 0.1 10*3/uL (ref 0.0–0.5)
EOS%: 2.1 % (ref 0.0–7.0)
HEMATOCRIT: 37.6 % (ref 34.8–46.6)
HGB: 12.4 g/dL (ref 11.6–15.9)
LYMPH#: 1.6 10*3/uL (ref 0.9–3.3)
LYMPH%: 24.3 % (ref 14.0–48.0)
MCH: 27.9 pg (ref 26.0–34.0)
MCHC: 33 g/dL (ref 32.0–36.0)
MCV: 85 fL (ref 81–101)
MONO#: 0.8 10*3/uL (ref 0.1–0.9)
MONO%: 11.2 % (ref 0.0–13.0)
NEUT#: 4.1 10*3/uL (ref 1.5–6.5)
NEUT%: 60.8 % (ref 39.6–80.0)
PLATELETS: 269 10*3/uL (ref 145–400)
RBC: 4.45 10*6/uL (ref 3.70–5.32)
RDW: 16.9 % — AB (ref 11.1–15.7)
WBC: 6.7 10*3/uL (ref 3.9–10.0)

## 2015-07-24 LAB — LACTATE DEHYDROGENASE: LDH: 177 U/L (ref 125–245)

## 2015-07-24 MED ORDER — HEPARIN SOD (PORK) LOCK FLUSH 100 UNIT/ML IV SOLN
500.0000 [IU] | Freq: Once | INTRAVENOUS | Status: AC
  Administered 2015-07-24: 500 [IU] via INTRAVENOUS
  Filled 2015-07-24: qty 5

## 2015-07-24 MED ORDER — SODIUM CHLORIDE 0.9% FLUSH
10.0000 mL | INTRAVENOUS | Status: DC | PRN
  Administered 2015-07-24: 10 mL via INTRAVENOUS
  Filled 2015-07-24: qty 10

## 2015-07-24 MED ORDER — SODIUM CHLORIDE 0.9 % IV SOLN
1000.0000 mL | Freq: Once | INTRAVENOUS | Status: AC
  Administered 2015-07-24: 1000 mL via INTRAVENOUS

## 2015-07-24 MED ORDER — OXYCODONE HCL 20 MG/ML PO CONC: 5.0000 mg | ORAL | Status: DC | PRN

## 2015-07-24 NOTE — Progress Notes (Signed)
Hematology and Oncology Follow Up Visit  [name] [id number] 15-Jan-1951 65 y.o. 07/24/2015   Principle Diagnosis:  Metastatic adenocarcinoma the lung - bone metastases, lymph node metastasis and intrapulmonary metastasis Malignant left pleural effusion  Current Therapy:  AbraxaneAvastin - start 6/29 Navelbine/Avastin q 2wk dosing - s/p c#4 - progression or a normal allergies he is suggestive one [place] [facility] will Tecentriq s/p cycle 4 Xgeva 120 mg subcutaneous every month Palliative radiation therapy to the neck.    Interim History:  Ms. Metzinger is here today with her husband. She has had total time of things. She finished radiation to the neck/upper thoracic region. This really helped with pain in her left shoulder. However, she has radiation esophagitis. This has been a real problem for her. She is on OxyContin and OxyFast elixir. This is helpful. She is not able to eat anything solid right now. I told her that it might take 2 weeks before she started to be able to swallow better.  We did go ahead and do a PET scan on her. The PET scan unfortunately showed that there was progression. The progression seen be mostly with her bones. Her pulmonary disease appeared fairly stable. There is no recurrence of the pleural effusion. I think the Avastin had really helped with that.   She has not had constipation. She's had no diarrhea.   She is still dealing with the ablation would try to get custody of her great granddaughter. Actually, her daughter is trying to get custody. They go to court on June 28.  She has not had any cough. There's not been any increased shortness of breath. She has had no bleeding  There's been no leg swelling. She has had no rashes.  Currently, her performance status is ECOG 2..   Medications:    Medication List       This list is accurate as of: 07/24/15 12:59 PM.  Always use your most recent med list.               aspirin 81 MG tablet  Take 81 mg  by mouth daily.     budesonide-formoterol 160-4.5 MCG/ACT inhaler  Commonly known as:  SYMBICORT  Inhale 2 puffs into the lungs 2 (two) times daily.     calcium-vitamin D 250-125 MG-UNIT tablet  Commonly known as:  OSCAL  Take 1 tablet by mouth daily.     clarithromycin 500 MG tablet  Commonly known as:  BIAXIN  Take 500 mg by mouth 2 (two) times daily. Reported on 07/08/2015     Echinacea 400 MG Caps  Take 400 mg by mouth daily.     fish oil-omega-3 fatty acids 1000 MG capsule  Take 2 g by mouth daily.     folic acid 1 MG tablet  Commonly known as:  FOLVITE  Take 1 mg by mouth daily.     gabapentin 300 MG capsule  Commonly known as:  NEURONTIN  Take 1 capsule (300 mg total) by mouth 3 (three) times daily.     KLOR-CON M20 20 MEQ tablet  Generic drug:  potassium chloride SA  TAKE 1 TABLET BY MOUTH DAILY     lactulose 10 GM/15ML solution  Commonly known as:  CHRONULAC  Take 10 g by mouth 3 (three) times daily. Reported on 07/22/2015     lidocaine 2 % solution  Commonly known as:  XYLOCAINE  Use as directed 15 mLs in the mouth or throat as needed for mouth pain.  omeprazole 40 MG capsule  Commonly known as:  PRILOSEC  Take 40 mg by mouth 2 (two) times daily.     oxyCODONE 10 mg 12 hr tablet  Commonly known as:  OXYCONTIN  Take 1 tablet (10 mg total) by mouth every 12 (twelve) hours.     oxyCODONE 20 MG/ML concentrated solution  Commonly known as:  ROXICODONE INTENSOL  Take 0.3 mLs (6 mg total) by mouth every 2 (two) hours as needed for severe pain.     polyethylene glycol powder powder  Commonly known as:  GLYCOLAX/MIRALAX  Take 17 g by mouth once. Dissolve 1 capful of powder into any liquid and take once daily. Can increase to 2 times daily if no effect after 3-4 days.     PROAIR RESPICLICK 062 (90 Base) MCG/ACT Aepb  Generic drug:  Albuterol Sulfate  Inhale 2 puffs into the lungs twice daily as needed for shortness of breath or wheezing      prochlorperazine 10 MG tablet  Commonly known as:  COMPAZINE  Take 10 mg by mouth every 6 (six) hours as needed for nausea or vomiting.     senna-docusate 8.6-50 MG tablet  Commonly known as:  Senokot-S  Take 1 tablet by mouth daily.     SONAFINE EX  Apply topically.     SUMAtriptan 100 MG tablet  Commonly known as:  IMITREX  Take 100 mg by mouth as needed for migraine or headache. Reported on 04/01/2015     Vitamin D 2000 units tablet  Take 2,000 Units by mouth daily.        Allergies:  Allergies  Allergen Reactions  . Demerol Other (See Comments)    Syncope  . Mefoxin [Cefoxitin Sodium In Dextrose]     rash  . Topamax Other (See Comments)    Had trouble walking  . Avelox [Moxifloxacin Hcl In Nacl]     Hypotension.    Past Medical History, Surgical history, Social history, and Family History were reviewed and updated.  Review of Systems: All other 10 point review of systems is negative.   Physical Exam:  height is '5\' 1"'$  (1.549 m) and weight is 127 lb (57.607 kg). Her oral temperature is 98 F (36.7 C). Her blood pressure is 152/97 and her pulse is 84. Her respiration is 16.   Wt Readings from Last 3 Encounters:  07/24/15 127 lb (57.607 kg)  07/22/15 128 lb 8 oz (58.287 kg)  07/14/15 126 lb 4.8 oz (57.289 kg)    Well-developed and well-nourished white female in no obvious distress. Head and neck exam shows no ocular or oral lesions. There are no palpable cervical or supraclavicular lymph nodes. Lungs are clear on the right side. There is still some slight decrease breath sounds on the left side. Cardiac exam regular rate and rhythm with no murmurs, rubs or bruits. Abdomen is soft. She is good bowel sounds. There is no fluid wave. There is no palpable liver or spleen tip. Back exam shows no tenderness over the spine, ribs or hips. Extremities shows no clubbing, cyanosis or edema. Skin exam shows no rashes, ecchymoses or petechia. Neurological exam shows no focal  neurological deficits.   Lab Results  Component Value Date   WBC 6.7 07/24/2015   HGB 12.4 07/24/2015   HCT 37.6 07/24/2015   MCV 85 07/24/2015   PLT 269 07/24/2015   Lab Results  Component Value Date   FERRITIN 47 10/29/2013   IRON 64 10/29/2013   TIBC 233* 10/29/2013  UIBC 169 10/29/2013   IRONPCTSAT 27 10/29/2013   Lab Results  Component Value Date   RETICCTPCT 1.4 08/13/2008   RBC 4.45 07/24/2015   RETICCTABS 61.6 08/13/2008   No results found for: KPAFRELGTCHN, LAMBDASER, KAPLAMBRATIO No results found for: IGGSERUM, IGA, IGMSERUM No results found for: Odetta Pink, SPEI   Chemistry      Component Value Date/Time   NA 136 07/24/2015 1106   NA 140 07/22/2015 1255   NA 134* 06/27/2015 1812   K 3.9 07/24/2015 1106   K 4.0 07/22/2015 1255   K 4.3 06/27/2015 1812   CL 103 07/24/2015 1106   CL 103 06/27/2015 1812   CO2 26 07/24/2015 1106   CO2 27 07/22/2015 1255   CO2 24 06/27/2015 1812   BUN 7 07/24/2015 1106   BUN 9.8 07/22/2015 1255   BUN 13 06/27/2015 1812   CREATININE 0.5* 07/24/2015 1106   CREATININE 0.6 07/22/2015 1255   CREATININE 0.49 06/27/2015 1812      Component Value Date/Time   CALCIUM 8.3 07/24/2015 1106   CALCIUM 9.1 07/22/2015 1255   CALCIUM 8.9 06/27/2015 1812   ALKPHOS 98* 07/24/2015 1106   ALKPHOS 119 06/27/2015 1812   AST 21 07/24/2015 1106   AST 20 06/27/2015 1812   ALT 17 07/24/2015 1106   ALT 17 06/27/2015 1812   BILITOT 0.60 07/24/2015 1106   BILITOT 0.7 06/27/2015 1812     Impression and Plan: Ms. Reckner is a pleasant 65 yo white female with metastatic adenocarcinoma of the lung. She has no "Driver" mutations that we can target.  I am glad that her back pain is doing better. I know that her esophagitis will improve.   I think that her performance status will improve and that she will go to take chemotherapy area and  I think that Abraxane would be reasonable. I think  Abraxane with Avastin would be logical. Again she has not had Abraxane.  I talked her about the side effects. I taught her about the possibility of losing her hair. I don't think that her blood counts be all that affected. She may have some fatigue. She may have some diarrhea. She may have some nausea. I think that overall, she should be able to handled this pretty well.   We will go he is starting a couple weeks. I will like to see her get a little better with her performance status before we try anything. I want to see a she is eating a little bit better.   I spent 45 mins with she and her husband. She is getting IV fluids. She is dehydrated. Again the esophagitis has been an issue but she and her husband are really doing great job and trying to manage this is much as possible.  I will see her back in 2 weeks to make sure that she is okay before she starts treatment.    Volanda Napoleon, MD 6/16/201712:59 PM

## 2015-07-24 NOTE — Patient Instructions (Signed)

## 2015-07-26 LAB — PREALBUMIN: PREALBUMIN: 17 mg/dL (ref 10–36)

## 2015-07-28 ENCOUNTER — Other Ambulatory Visit: Payer: Self-pay | Admitting: *Deleted

## 2015-07-28 IMAGING — CT CT CHEST W/ CM
2 of 3 series · 15 of 36 positions shown, 18 images · IV contrast (APPLIED)
Comparison: CT 02/04/2013

CLINICAL DATA: Recurrent lung cancer.

EXAM:
CT CHEST WITH CONTRAST
TECHNIQUE: Multidetector CT imaging of the chest was performed during
intravenous contrast administration.
CONTRAST:  80mL OMNIPAQUE IOHEXOL 300 MG/ML  SOLN

[Series 2: chest 5.0 b31f · axial · 0.65mm/px · z∈[-142,+92]mm · 12 of 57 slices shown, 15 images]
[im 5/57  mediastinal]
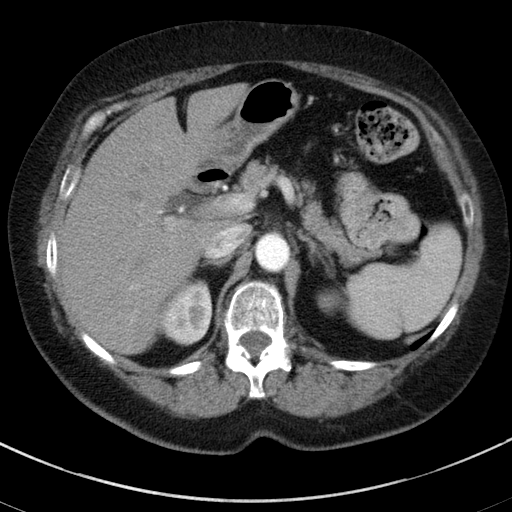
[im 5/57  lung]
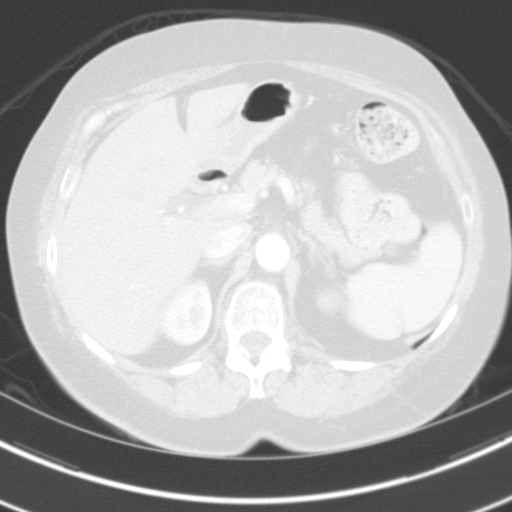
[im 9/57  lung]
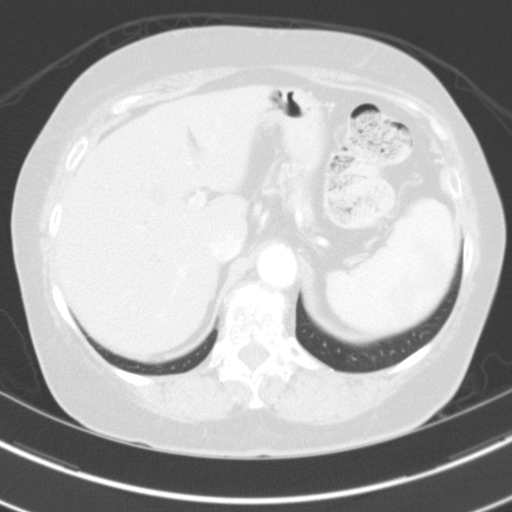
[im 13/57  lung]
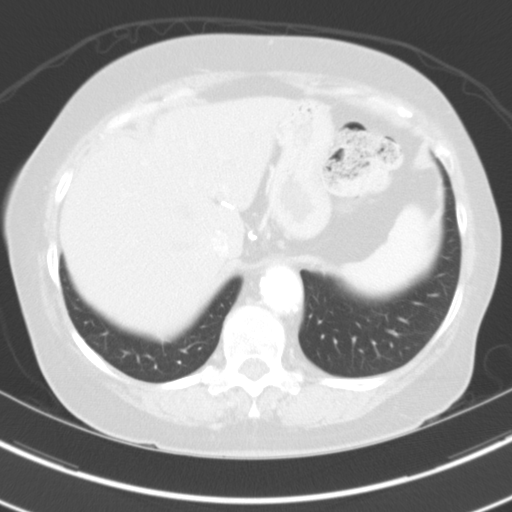
[im 17/57  lung]
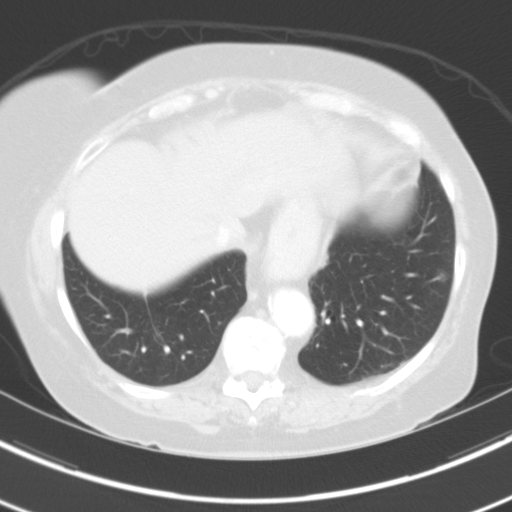
[im 21/57  mediastinal]
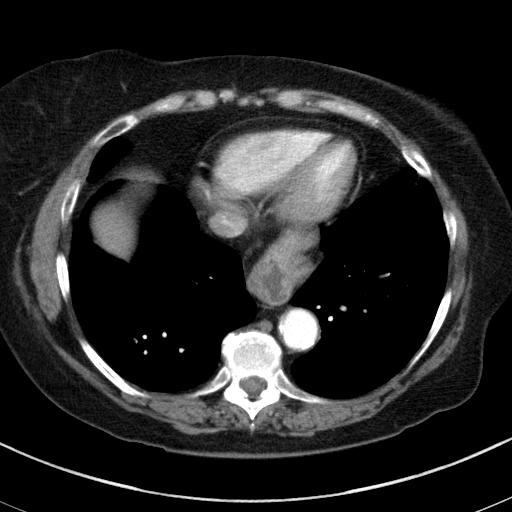
[im 21/57  lung]
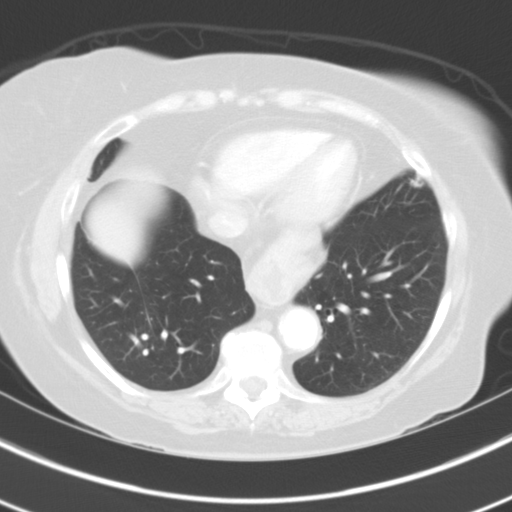
[im 25/57  lung]
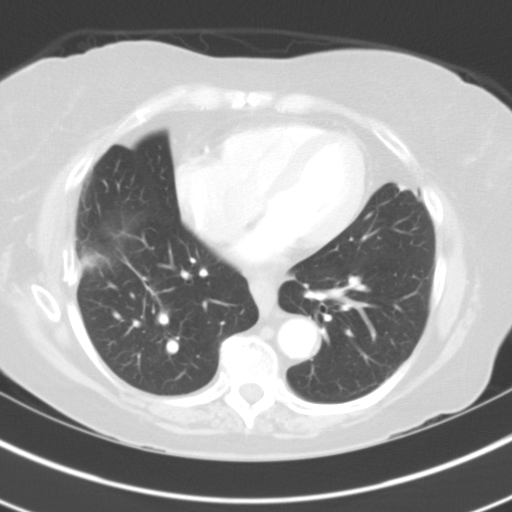
[im 32/57  lung]
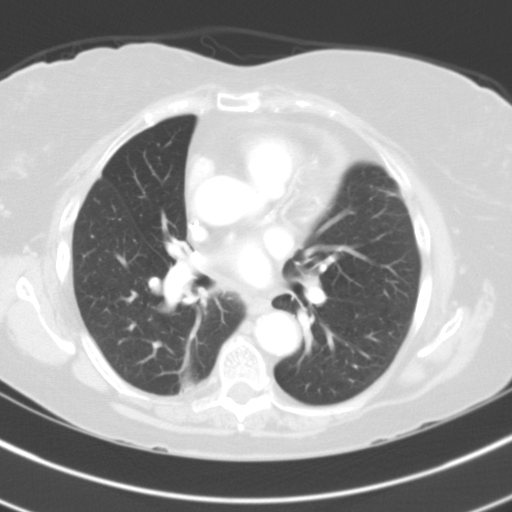
[im 36/57  lung]
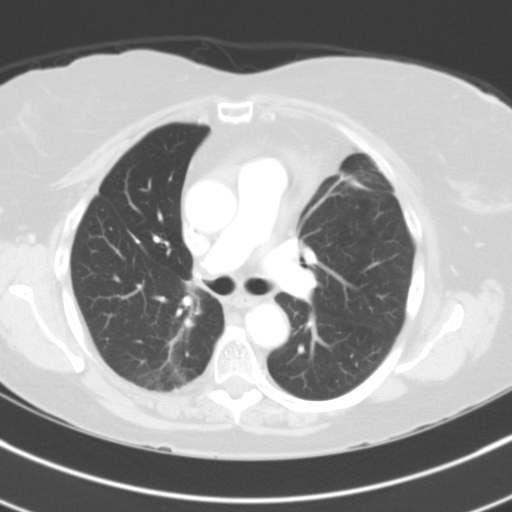
[im 40/57  mediastinal]
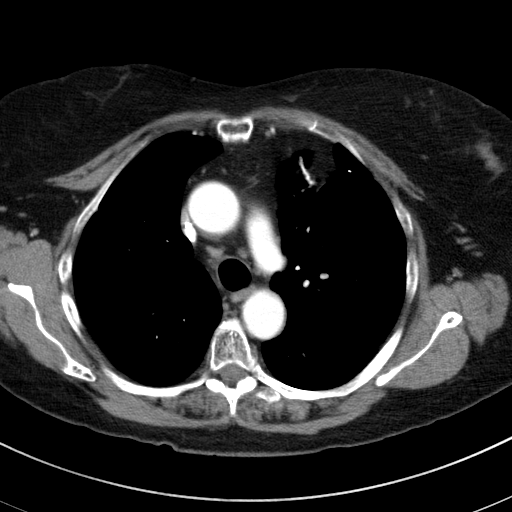
[im 40/57  lung]
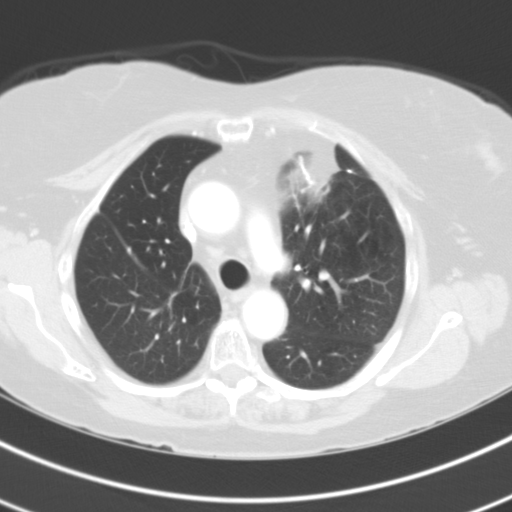
[im 44/57  lung]
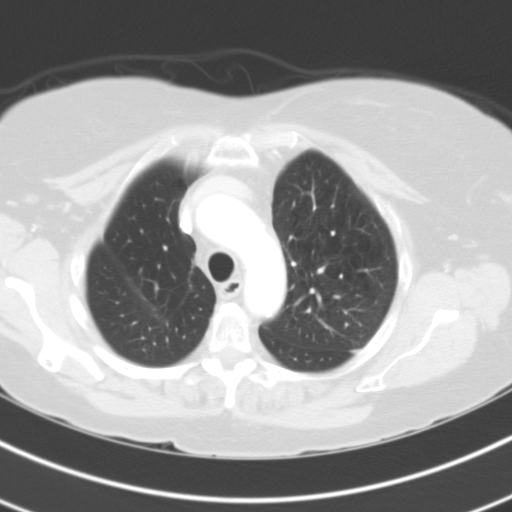
[im 48/57  lung]
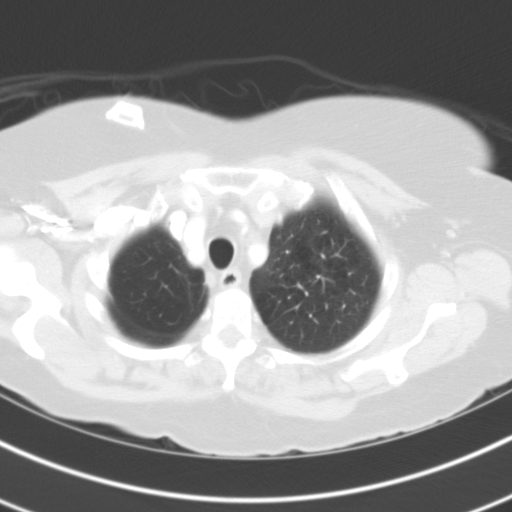
[im 52/57  lung]
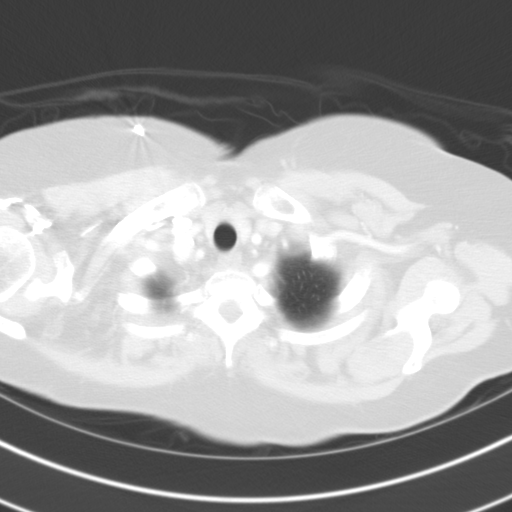

[Series 6: chest 3.0 coronal · coronal · 0.60mm/px · 3 of 94 slices shown]
[im 19/94  lung]
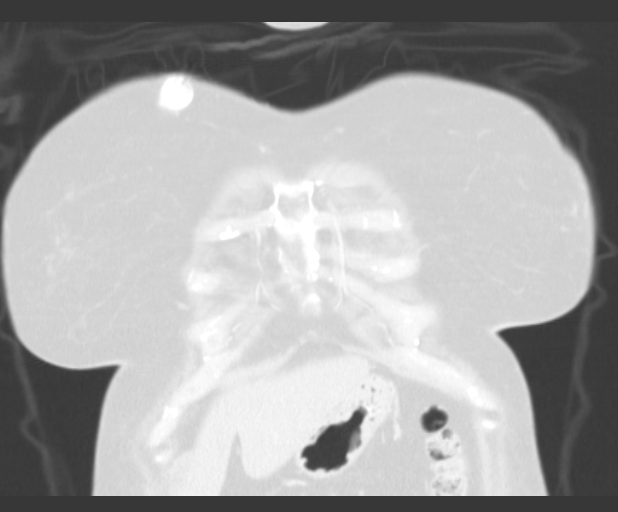
[im 38/94  lung]
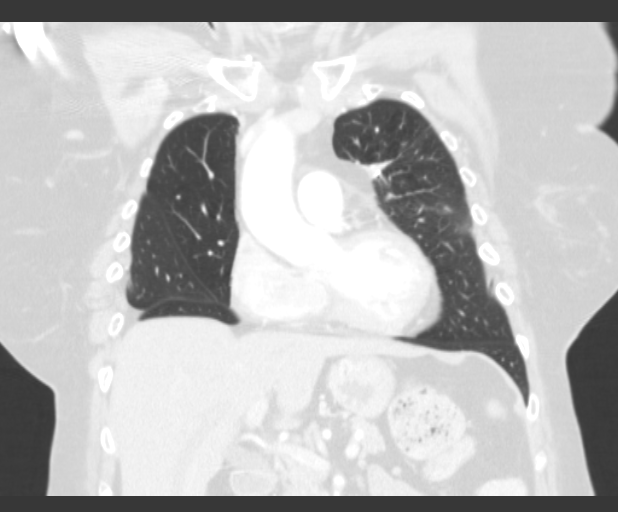
[im 56/94  lung]
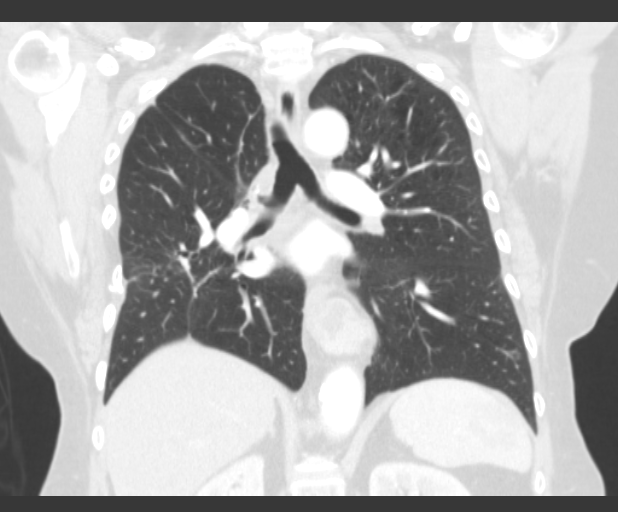

[15 of 36 positions shown; findings below may reference images not displayed]

FINDINGS: There is a port in the right anterior chest wall. No axillary or
supraclavicular lymphadenopathy. No mediastinal or hilar
lymphadenopathy. No pericardial fluid.

Review of the lung parenchyma demonstrates postsurgical change in
the left upper lobe. No nodularity. In the right lower lobe, there
is perihilar focus of consolidation measuring 31 x 19 mm which
compares to 36 x 24 mm on CT of 09/03/2012. No new nodularity
present.

Limited view of the upper abdomen demonstrates a geographic pattern
of enhancement within the spleen which is likely a vascular
phenomena. Adrenal glands are normal. Limited view of the skeleton
is unremarkable.
IMPRESSION: 1. Stable exam of the chest.
2. Right perihilar consolidation is not changed from prior.
Recommend attention on follow-up.

## 2015-07-28 MED ORDER — HYDROCODONE-HOMATROPINE 5-1.5 MG/5ML PO SYRP: 5.0000 mL | ORAL_SOLUTION | Freq: Four times a day (QID) | ORAL | Status: DC | PRN

## 2015-07-29 MED FILL — OXYCODONE HCL 100 MG/5 ML S: 100 | 9 days supply | Qty: 30 | Fill #0

## 2015-07-29 MED FILL — HYDROCODONE-HOMATROPINE SYR: 5-1.5 | 6 days supply | Qty: 120 | Fill #0

## 2015-08-04 DIAGNOSIS — J40 Bronchitis, not specified as acute or chronic: Secondary | ICD-10-CM | POA: Diagnosis not present

## 2015-08-04 DIAGNOSIS — J0100 Acute maxillary sinusitis, unspecified: Secondary | ICD-10-CM | POA: Diagnosis not present

## 2015-08-04 DIAGNOSIS — Z0000 Encounter for general adult medical examination without abnormal findings: Secondary | ICD-10-CM | POA: Diagnosis not present

## 2015-08-04 DIAGNOSIS — R300 Dysuria: Secondary | ICD-10-CM | POA: Diagnosis not present

## 2015-08-07 ENCOUNTER — Ambulatory Visit (HOSPITAL_BASED_OUTPATIENT_CLINIC_OR_DEPARTMENT_OTHER): Payer: PPO

## 2015-08-07 ENCOUNTER — Other Ambulatory Visit (HOSPITAL_BASED_OUTPATIENT_CLINIC_OR_DEPARTMENT_OTHER): Payer: PPO

## 2015-08-07 ENCOUNTER — Ambulatory Visit (HOSPITAL_BASED_OUTPATIENT_CLINIC_OR_DEPARTMENT_OTHER): Payer: PPO | Admitting: Hematology & Oncology

## 2015-08-07 VITALS — BP 128/85 | HR 101 | Temp 98.0°F | Resp 18 | Ht 61.0 in | Wt 125.1 lb

## 2015-08-07 VITALS — BP 132/92 | HR 92 | Resp 16

## 2015-08-07 DIAGNOSIS — K209 Esophagitis, unspecified: Secondary | ICD-10-CM

## 2015-08-07 DIAGNOSIS — Z5111 Encounter for antineoplastic chemotherapy: Secondary | ICD-10-CM

## 2015-08-07 DIAGNOSIS — C3490 Malignant neoplasm of unspecified part of unspecified bronchus or lung: Secondary | ICD-10-CM

## 2015-08-07 DIAGNOSIS — C3491 Malignant neoplasm of unspecified part of right bronchus or lung: Secondary | ICD-10-CM

## 2015-08-07 DIAGNOSIS — Z5112 Encounter for antineoplastic immunotherapy: Secondary | ICD-10-CM

## 2015-08-07 DIAGNOSIS — C3492 Malignant neoplasm of unspecified part of left bronchus or lung: Secondary | ICD-10-CM

## 2015-08-07 DIAGNOSIS — C7951 Secondary malignant neoplasm of bone: Secondary | ICD-10-CM | POA: Diagnosis not present

## 2015-08-07 DIAGNOSIS — J910 Malignant pleural effusion: Secondary | ICD-10-CM

## 2015-08-07 DIAGNOSIS — C342 Malignant neoplasm of middle lobe, bronchus or lung: Secondary | ICD-10-CM

## 2015-08-07 LAB — CBC WITH DIFFERENTIAL (CANCER CENTER ONLY)
BASO#: 0.1 10*3/uL (ref 0.0–0.2)
BASO%: 0.5 % (ref 0.0–2.0)
EOS ABS: 0.2 10*3/uL (ref 0.0–0.5)
EOS%: 2.1 % (ref 0.0–7.0)
HEMATOCRIT: 38.6 % (ref 34.8–46.6)
HEMOGLOBIN: 12.7 g/dL (ref 11.6–15.9)
LYMPH#: 1.5 10*3/uL (ref 0.9–3.3)
LYMPH%: 16.3 % (ref 14.0–48.0)
MCH: 27.9 pg (ref 26.0–34.0)
MCHC: 32.9 g/dL (ref 32.0–36.0)
MCV: 85 fL (ref 81–101)
MONO#: 0.9 10*3/uL (ref 0.1–0.9)
MONO%: 9.6 % (ref 0.0–13.0)
NEUT%: 71.5 % (ref 39.6–80.0)
NEUTROS ABS: 6.6 10*3/uL — AB (ref 1.5–6.5)
Platelets: 259 10*3/uL (ref 145–400)
RBC: 4.56 10*6/uL (ref 3.70–5.32)
RDW: 17.1 % — AB (ref 11.1–15.7)
WBC: 9.2 10*3/uL (ref 3.9–10.0)

## 2015-08-07 LAB — CMP (CANCER CENTER ONLY)
ALBUMIN: 3.4 g/dL (ref 3.3–5.5)
ALT(SGPT): 14 U/L (ref 10–47)
AST: 18 U/L (ref 11–38)
Alkaline Phosphatase: 96 U/L — ABNORMAL HIGH (ref 26–84)
BUN, Bld: 13 mg/dL (ref 7–22)
CALCIUM: 9.2 mg/dL (ref 8.0–10.3)
CHLORIDE: 101 meq/L (ref 98–108)
CO2: 27 meq/L (ref 18–33)
Creat: 0.6 mg/dl (ref 0.6–1.2)
GLUCOSE: 107 mg/dL (ref 73–118)
Potassium: 3.8 mEq/L (ref 3.3–4.7)
Sodium: 134 mEq/L (ref 128–145)
Total Bilirubin: 0.5 mg/dl (ref 0.20–1.60)
Total Protein: 7.6 g/dL (ref 6.4–8.1)

## 2015-08-07 MED ORDER — SODIUM CHLORIDE 0.9 % IV SOLN
10.0000 mg/kg | Freq: Once | INTRAVENOUS | Status: AC
  Administered 2015-08-07: 575 mg via INTRAVENOUS
  Filled 2015-08-07: qty 16

## 2015-08-07 MED ORDER — SODIUM CHLORIDE 0.9 % IV SOLN
Freq: Once | INTRAVENOUS | Status: AC
  Administered 2015-08-07: 12:00:00 via INTRAVENOUS

## 2015-08-07 MED ORDER — HEPARIN SOD (PORK) LOCK FLUSH 100 UNIT/ML IV SOLN
500.0000 [IU] | Freq: Once | INTRAVENOUS | Status: AC | PRN
  Administered 2015-08-07: 500 [IU]
  Filled 2015-08-07: qty 5

## 2015-08-07 MED ORDER — PROCHLORPERAZINE MALEATE 10 MG PO TABS
10.0000 mg | ORAL_TABLET | Freq: Once | ORAL | Status: AC
  Administered 2015-08-07: 10 mg via ORAL

## 2015-08-07 MED ORDER — SODIUM CHLORIDE 0.9 % IV SOLN: Freq: Once | INTRAVENOUS | Status: DC

## 2015-08-07 MED ORDER — PACLITAXEL PROTEIN-BOUND CHEMO INJECTION 100 MG
81.0000 mg/m2 | Freq: Once | INTRAVENOUS | Status: AC
  Administered 2015-08-07: 125 mg via INTRAVENOUS
  Filled 2015-08-07: qty 25

## 2015-08-07 MED ORDER — ALTEPLASE 2 MG IJ SOLR
2.0000 mg | Freq: Once | INTRAMUSCULAR | Status: DC | PRN
  Filled 2015-08-07: qty 2

## 2015-08-07 MED ORDER — SODIUM CHLORIDE 0.9% FLUSH
3.0000 mL | INTRAVENOUS | Status: DC | PRN
  Filled 2015-08-07: qty 10

## 2015-08-07 MED ORDER — HEPARIN SOD (PORK) LOCK FLUSH 100 UNIT/ML IV SOLN
250.0000 [IU] | Freq: Once | INTRAVENOUS | Status: DC | PRN
  Filled 2015-08-07: qty 5

## 2015-08-07 MED ORDER — SODIUM CHLORIDE 0.9% FLUSH
10.0000 mL | INTRAVENOUS | Status: DC | PRN
  Administered 2015-08-07: 10 mL
  Filled 2015-08-07: qty 10

## 2015-08-07 MED ORDER — PROCHLORPERAZINE MALEATE 10 MG PO TABS
ORAL_TABLET | ORAL | Status: AC
  Filled 2015-08-07: qty 1

## 2015-08-07 MED ORDER — DENOSUMAB 120 MG/1.7ML ~~LOC~~ SOLN
120.0000 mg | Freq: Once | SUBCUTANEOUS | Status: AC
  Administered 2015-08-07: 120 mg via SUBCUTANEOUS
  Filled 2015-08-07: qty 1.7

## 2015-08-07 NOTE — Progress Notes (Signed)
Assumed care of patient @ 1330; report received from [facility] RN.

## 2015-08-07 NOTE — Patient Instructions (Addendum)
Nanoparticle Albumin-Bound Paclitaxel injection What is this medicine? NANOPARTICLE ALBUMIN-BOUND PACLITAXEL (Na no PAHR ti kuhl al BYOO muhn-bound PAK li TAX el) is a chemotherapy drug. It targets fast dividing cells, like cancer cells, and causes these cells to die. This medicine is used to treat advanced breast cancer and advanced lung cancer. This medicine may be used for other purposes; ask your health care provider or pharmacist if you have questions. What should I tell my health care provider before I take this medicine? They need to know if you have any of these conditions: -kidney disease -liver disease -low blood counts, like low platelets, red blood cells, or white blood cells -recent or ongoing radiation therapy -an unusual or allergic reaction to paclitaxel, albumin, other chemotherapy, other medicines, foods, dyes, or preservatives -pregnant or trying to get pregnant -breast-feeding How should I use this medicine? This drug is given as an infusion into a vein. It is administered in a hospital or clinic by a specially trained health care professional. Talk to your pediatrician regarding the use of this medicine in children. Special care may be needed. Overdosage: If you think you have taken too much of this medicine contact a poison control center or emergency room at once. NOTE: This medicine is only for you. Do not share this medicine with others. What if I miss a dose? It is important not to miss your dose. Call your doctor or health care professional if you are unable to keep an appointment. What may interact with this medicine? -cyclosporine -diazepam -ketoconazole -medicines to increase blood counts like filgrastim, pegfilgrastim, sargramostim -other chemotherapy drugs like cisplatin, doxorubicin, epirubicin, etoposide, teniposide, vincristine -quinidine -testosterone -vaccines -verapamil Talk to your doctor or health care professional before taking any of these  medicines: -acetaminophen -aspirin -ibuprofen -ketoprofen -naproxen This list may not describe all possible interactions. Give your health care provider a list of all the medicines, herbs, non-prescription drugs, or dietary supplements you use. Also tell them if you smoke, drink alcohol, or use illegal drugs. Some items may interact with your medicine. What should I watch for while using this medicine? Your condition will be monitored carefully while you are receiving this medicine. You will need important blood work done while you are taking this medicine. This drug may make you feel generally unwell. This is not uncommon, as chemotherapy can affect healthy cells as well as cancer cells. Report any side effects. Continue your course of treatment even though you feel ill unless your doctor tells you to stop. In some cases, you may be given additional medicines to help with side effects. Follow all directions for their use. Call your doctor or health care professional for advice if you get a fever, chills or sore throat, or other symptoms of a cold or flu. Do not treat yourself. This drug decreases your body's ability to fight infections. Try to avoid being around people who are sick. This medicine may increase your risk to bruise or bleed. Call your doctor or health care professional if you notice any unusual bleeding. Be careful brushing and flossing your teeth or using a toothpick because you may get an infection or bleed more easily. If you have any dental work done, tell your dentist you are receiving this medicine. Avoid taking products that contain aspirin, acetaminophen, ibuprofen, naproxen, or ketoprofen unless instructed by your doctor. These medicines may hide a fever. Do not become pregnant while taking this medicine. Women should inform their doctor if they wish to become pregnant or  think they might be pregnant. There is a potential for serious side effects to an unborn child. Talk to  your health care professional or pharmacist for more information. Do not breast-feed an infant while taking this medicine. Men are advised not to father a child while receiving this medicine. What side effects may I notice from receiving this medicine? Side effects that you should report to your doctor or health care professional as soon as possible: -allergic reactions like skin rash, itching or hives, swelling of the face, lips, or tongue -low blood counts - This drug may decrease the number of white blood cells, red blood cells and platelets. You may be at increased risk for infections and bleeding. -signs of infection - fever or chills, cough, sore throat, pain or difficulty passing urine -signs of decreased platelets or bleeding - bruising, pinpoint red spots on the skin, black, tarry stools, nosebleeds -signs of decreased red blood cells - unusually weak or tired, fainting spells, lightheadedness -breathing problems -changes in vision -chest pain -high or low blood pressure -mouth sores -nausea and vomiting -pain, swelling, redness or irritation at the injection site -pain, tingling, numbness in the hands or feet -slow or irregular heartbeat -swelling of the ankle, feet, hands Side effects that usually do not require medical attention (report to your doctor or health care professional if they continue or are bothersome): -aches, pains -changes in the color of fingernails -diarrhea -hair loss -loss of appetite This list may not describe all possible side effects. Call your doctor for medical advice about side effects. You may report side effects to FDA at 1-800-FDA-1088. Where should I keep my medicine? This drug is given in a hospital or clinic and will not be stored at home. NOTE: This sheet is a summary. It may not cover all possible information. If you have questions about this medicine, talk to your doctor, pharmacist, or health care provider.    2016, [employer].  (2012-03-19 16:48:50) Denosumab injection What is this medicine? DENOSUMAB (den oh sue mab) slows bone breakdown. Prolia is used to treat osteoporosis in women after menopause and in men. Delton See is used to prevent bone fractures and other bone problems caused by cancer bone metastases. Delton See is also used to treat giant cell tumor of the bone. This medicine may be used for other purposes; ask your health care provider or pharmacist if you have questions. What should I tell my health care provider before I take this medicine? They need to know if you have any of these conditions: -dental disease -eczema -infection or history of infections -kidney disease or on dialysis -low blood calcium or vitamin D -malabsorption syndrome -scheduled to have surgery or tooth extraction -taking medicine that contains denosumab -thyroid or parathyroid disease -an unusual reaction to denosumab, other medicines, foods, dyes, or preservatives -pregnant or trying to get pregnant -breast-feeding How should I use this medicine? This medicine is for injection under the skin. It is given by a health care professional in a hospital or clinic setting. If you are getting Prolia, a special MedGuide will be given to you by the pharmacist with each prescription and refill. Be sure to read this information carefully each time. For Prolia, talk to your pediatrician regarding the use of this medicine in children. Special care may be needed. For Delton See, talk to your pediatrician regarding the use of this medicine in children. While this drug may be prescribed for children as young as 13 years for selected conditions, precautions do apply. Overdosage:  If you think you have taken too much of this medicine contact a poison control center or emergency room at once. NOTE: This medicine is only for you. Do not share this medicine with others. What if I miss a dose? It is important not to miss your dose. Call your doctor or health care  professional if you are unable to keep an appointment. What may interact with this medicine? Do not take this medicine with any of the following medications: -other medicines containing denosumab This medicine may also interact with the following medications: -medicines that suppress the immune system -medicines that treat cancer -steroid medicines like prednisone or cortisone This list may not describe all possible interactions. Give your health care provider a list of all the medicines, herbs, non-prescription drugs, or dietary supplements you use. Also tell them if you smoke, drink alcohol, or use illegal drugs. Some items may interact with your medicine. What should I watch for while using this medicine? Visit your doctor or health care professional for regular checks on your progress. Your doctor or health care professional may order blood tests and other tests to see how you are doing. Call your doctor or health care professional if you get a cold or other infection while receiving this medicine. Do not treat yourself. This medicine may decrease your body's ability to fight infection. You should make sure you get enough calcium and vitamin D while you are taking this medicine, unless your doctor tells you not to. Discuss the foods you eat and the vitamins you take with your health care professional. See your dentist regularly. Brush and floss your teeth as directed. Before you have any dental work done, tell your dentist you are receiving this medicine. Do not become pregnant while taking this medicine or for 5 months after stopping it. Women should inform their doctor if they wish to become pregnant or think they might be pregnant. There is a potential for serious side effects to an unborn child. Talk to your health care professional or pharmacist for more information. What side effects may I notice from receiving this medicine? Side effects that you should report to your doctor or health care  professional as soon as possible: -allergic reactions like skin rash, itching or hives, swelling of the face, lips, or tongue -breathing problems -chest pain -fast, irregular heartbeat -feeling faint or lightheaded, falls -fever, chills, or any other sign of infection -muscle spasms, tightening, or twitches -numbness or tingling -skin blisters or bumps, or is dry, peels, or red -slow healing or unexplained pain in the mouth or jaw -unusual bleeding or bruising Side effects that usually do not require medical attention (Report these to your doctor or health care professional if they continue or are bothersome.): -muscle pain -stomach upset, gas This list may not describe all possible side effects. Call your doctor for medical advice about side effects. You may report side effects to FDA at 1-800-FDA-1088. Where should I keep my medicine? This medicine is only given in a clinic, doctor's office, or other health care setting and will not be stored at home. NOTE: This sheet is a summary. It may not cover all possible information. If you have questions about this medicine, talk to your doctor, pharmacist, or health care provider.    2016, [employer]. (2011-07-25 12:37:47) Bevacizumab injection What is this medicine? BEVACIZUMAB (be va SIZ yoo mab) is a monoclonal antibody. It is used to treat cervical cancer, colorectal cancer, glioblastoma multiforme, non-small cell lung cancer (NSCLC), ovarian  cancer, and renal cell cancer. This medicine may be used for other purposes; ask your health care provider or pharmacist if you have questions. What should I tell my health care provider before I take this medicine? They need to know if you have any of these conditions: -blood clots -heart disease, including heart failure, heart attack, or chest pain (angina) -high blood pressure -infection (especially a virus infection such as chickenpox, cold sores, or herpes) -kidney disease -lung  disease -prior chemotherapy with doxorubicin, daunorubicin, epirubicin, or other anthracycline type chemotherapy agents -recent or ongoing radiation therapy -recent surgery -stroke -an unusual or allergic reaction to bevacizumab, hamster proteins, mouse proteins, other medicines, foods, dyes, or preservatives -pregnant or trying to get pregnant -breast-feeding How should I use this medicine? This medicine is for infusion into a vein. It is given by a health care professional in a hospital or clinic setting. Talk to your pediatrician regarding the use of this medicine in children. Special care may be needed. Overdosage: If you think you have taken too much of this medicine contact a poison control center or emergency room at once. NOTE: This medicine is only for you. Do not share this medicine with others. What if I miss a dose? It is important not to miss your dose. Call your doctor or health care professional if you are unable to keep an appointment. What may interact with this medicine? Interactions are not expected. This list may not describe all possible interactions. Give your health care provider a list of all the medicines, herbs, non-prescription drugs, or dietary supplements you use. Also tell them if you smoke, drink alcohol, or use illegal drugs. Some items may interact with your medicine. What should I watch for while using this medicine? Your condition will be monitored carefully while you are receiving this medicine. You will need important blood work and urine testing done while you are taking this medicine. During your treatment, let your health care professional know if you have any unusual symptoms, such as difficulty breathing. This medicine may rarely cause 'gastrointestinal perforation' (holes in the stomach, intestines or colon), a serious side effect requiring surgery to repair. This medicine should be started at least 28 days following major surgery and the site of the  surgery should be totally healed. Check with your doctor before scheduling dental work or surgery while you are receiving this treatment. Talk to your doctor if you have recently had surgery or if you have a wound that has not healed. Do not become pregnant while taking this medicine or for 6 months after stopping it. Women should inform their doctor if they wish to become pregnant or think they might be pregnant. There is a potential for serious side effects to an unborn child. Talk to your health care professional or pharmacist for more information. Do not breast-feed an infant while taking this medicine. This medicine has caused ovarian failure in some women. This medicine may interfere with the ability to have a child. You should talk to your doctor or health care professional if you are concerned about your fertility. What side effects may I notice from receiving this medicine? Side effects that you should report to your doctor or health care professional as soon as possible: -allergic reactions like skin rash, itching or hives, swelling of the face, lips, or tongue -signs of infection - fever or chills, cough, sore throat, pain or trouble passing urine -signs of decreased platelets or bleeding - bruising, pinpoint red spots on the skin,  black, tarry stools, nosebleeds, blood in the urine -breathing problems -changes in vision -chest pain -confusion -jaw pain, especially after dental work -mouth sores -seizures -severe abdominal pain -severe headache -sudden numbness or weakness of the face, arm or leg -swelling of legs or ankles -symptoms of a stroke: change in mental awareness, inability to talk or move one side of the body (especially in patients with lung cancer) -trouble passing urine or change in the amount of urine -trouble speaking or understanding -trouble walking, dizziness, loss of balance or coordination Side effects that usually do not require medical attention (report to  your doctor or health care professional if they continue or are bothersome): -constipation -diarrhea -dry skin -headache -loss of appetite -nausea, vomiting This list may not describe all possible side effects. Call your doctor for medical advice about side effects. You may report side effects to FDA at 1-800-FDA-1088. Where should I keep my medicine? This drug is given in a hospital or clinic and will not be stored at home. NOTE: This sheet is a summary. It may not cover all possible information. If you have questions about this medicine, talk to your doctor, pharmacist, or health care provider.    2016, [employer]. (2014-03-25 16:58:44)

## 2015-08-07 NOTE — Progress Notes (Signed)
Hematology and Oncology Follow Up Visit  [name] [id number] 19-Dec-1950 65 y.o. 08/07/2015   Principle Diagnosis:  Metastatic adenocarcinoma the lung - bone metastases, lymph node metastasis and intrapulmonary metastasis Malignant left pleural effusion  Current Therapy:  AbraxaneAvastin - start 6/30 Navelbine/Avastin q 2wk dosing - s/p c#4 - progression or a normal allergies he is suggestive one [place] [facility] will Tecentriq s/p cycle 4 Xgeva 120 mg subcutaneous every month Palliative radiation therapy to the neck.    Interim History:  [name] is here today with her husband. She is feeling better. Her odynophagia is improving. She is eating much better. She is swallowing better. She is quite hoarse. This might be from the radiation. It also could be from adenopathy in her chest pressing on the recurrent laryngeal nerve.   Unfortunately, this still has not been a decision regarding her  Great-grandchild. This is a big issue with the family. They had a court date set for yesterday but unfortunately this is been moved to July now.   She does not feel as short of breath. She has some pain in the right upper chest wall.  She's had no process with bowels or bladder. There may be a little bit of constipation.   Her appetite is improving.   She's had no issues with bleeding or bruising. Has been no fever. She's had no rash. She's had no leg swelling.sed shortness of breath. She has had no bleeding    Currently, her performance status is ECOG 1..   Medications:    Medication List       This list is accurate as of: 08/07/15  3:27 PM.  Always use your most recent med list.               aspirin 81 MG tablet  Take 81 mg by mouth daily.     budesonide-formoterol 160-4.5 MCG/ACT inhaler  Commonly known as:  SYMBICORT  Inhale 2 puffs into the lungs 2 (two) times daily.     calcium-vitamin D 250-125 MG-UNIT tablet  Commonly known as:  OSCAL  Take 1 tablet by mouth daily.       cefUROXime 500 MG tablet  Commonly known as:  CEFTIN     Echinacea 400 MG Caps  Take 400 mg by mouth daily.     fish oil-omega-3 fatty acids 1000 MG capsule  Take 2 g by mouth daily.     folic acid 1 MG tablet  Commonly known as:  FOLVITE  Take 1 mg by mouth daily.     gabapentin 300 MG capsule  Commonly known as:  NEURONTIN  Take 1 capsule (300 mg total) by mouth 3 (three) times daily.     HYDROcodone-homatropine 5-1.5 MG/5ML syrup  Commonly known as:  HYCODAN  Take 5 mLs by mouth every 6 (six) hours as needed for cough.     KLOR-CON M20 20 MEQ tablet  Generic drug:  potassium chloride SA  TAKE 1 TABLET BY MOUTH DAILY     lactulose 10 GM/15ML solution  Commonly known as:  CHRONULAC  Take 10 g by mouth 3 (three) times daily. Reported on 07/22/2015     lidocaine 2 % solution  Commonly known as:  XYLOCAINE  Use as directed 15 mLs in the mouth or throat as needed for mouth pain.     lidocaine-prilocaine cream  Commonly known as:  EMLA  Apply topically.     omeprazole 40 MG capsule  Commonly known as:  PRILOSEC  Take 40 mg by mouth 2 (two) times daily.     oxyCODONE 10 mg 12 hr tablet  Commonly known as:  OXYCONTIN  Take 1 tablet (10 mg total) by mouth every 12 (twelve) hours.     oxyCODONE 20 MG/ML concentrated solution  Commonly known as:  ROXICODONE INTENSOL  Take 0.3 mLs (6 mg total) by mouth every 2 (two) hours as needed for severe pain.     polyethylene glycol powder powder  Commonly known as:  GLYCOLAX/MIRALAX  Take 17 g by mouth once. Dissolve 1 capful of powder into any liquid and take once daily. Can increase to 2 times daily if no effect after 3-4 days.     PROAIR RESPICLICK 948 (90 Base) MCG/ACT Aepb  Generic drug:  Albuterol Sulfate  Inhale 2 puffs into the lungs twice daily as needed for shortness of breath or wheezing     prochlorperazine 10 MG tablet  Commonly known as:  COMPAZINE  Take 10 mg by mouth every 6 (six) hours as needed for nausea  or vomiting.     senna-docusate 8.6-50 MG tablet  Commonly known as:  Senokot-S  Take 1 tablet by mouth daily.     SONAFINE EX  Apply topically.     SUMAtriptan 100 MG tablet  Commonly known as:  IMITREX  Take 100 mg by mouth as needed for migraine or headache. Reported on 04/01/2015     Vitamin D 2000 units tablet  Take 2,000 Units by mouth daily.        Allergies:  Allergies  Allergen Reactions  . Demerol Other (See Comments)    Syncope  . Mefoxin [Cefoxitin Sodium In Dextrose]     rash  . Topamax Other (See Comments)    Had trouble walking  . Avelox [Moxifloxacin Hcl In Nacl]     Hypotension.    Past Medical History, Surgical history, Social history, and Family History were reviewed and updated.  Review of Systems: All other 10 point review of systems is negative.   Physical Exam:  height is '5\' 1"'$  (1.549 m) and weight is 125 lb 1.9 oz (56.754 kg). Her oral temperature is 98 F (36.7 C). Her blood pressure is 128/85 and her pulse is 101. Her respiration is 18.   Wt Readings from Last 3 Encounters:  08/07/15 125 lb 1.9 oz (56.754 kg)  07/24/15 127 lb (57.607 kg)  07/22/15 128 lb 8 oz (58.287 kg)    Well-developed and well-nourished white female in no obvious distress. Head and neck exam shows no ocular or oral lesions. There are no palpable cervical or supraclavicular lymph nodes. Lungs are clear on the right side. There is still some slight decrease breath sounds on the left side. Cardiac exam regular rate and rhythm with no murmurs, rubs or bruits. Abdomen is soft. She is good bowel sounds. There is no fluid wave. There is no palpable liver or spleen tip. Back exam shows no tenderness over the spine, ribs or hips. Extremities shows no clubbing, cyanosis or edema. Skin exam shows no rashes, ecchymoses or petechia. Neurological exam shows no focal neurological deficits.   Lab Results  Component Value Date   WBC 9.2 08/07/2015   HGB 12.7 08/07/2015   HCT 38.6  08/07/2015   MCV 85 08/07/2015   PLT 259 08/07/2015   Lab Results  Component Value Date   FERRITIN 47 10/29/2013   IRON 64 10/29/2013   TIBC 233* 10/29/2013   UIBC 169 10/29/2013   IRONPCTSAT 27  10/29/2013   Lab Results  Component Value Date   RETICCTPCT 1.4 08/13/2008   RBC 4.56 08/07/2015   RETICCTABS 61.6 08/13/2008   No results found for: KPAFRELGTCHN, LAMBDASER, KAPLAMBRATIO No results found for: IGGSERUM, IGA, IGMSERUM No results found for: Odetta Pink, SPEI   Chemistry      Component Value Date/Time   NA 134 08/07/2015 1047   NA 140 07/22/2015 1255   NA 134* 06/27/2015 1812   K 3.8 08/07/2015 1047   K 4.0 07/22/2015 1255   K 4.3 06/27/2015 1812   CL 101 08/07/2015 1047   CL 103 06/27/2015 1812   CO2 27 08/07/2015 1047   CO2 27 07/22/2015 1255   CO2 24 06/27/2015 1812   BUN 13 08/07/2015 1047   BUN 9.8 07/22/2015 1255   BUN 13 06/27/2015 1812   CREATININE 0.6 08/07/2015 1047   CREATININE 0.6 07/22/2015 1255   CREATININE 0.49 06/27/2015 1812      Component Value Date/Time   CALCIUM 9.2 08/07/2015 1047   CALCIUM 9.1 07/22/2015 1255   CALCIUM 8.9 06/27/2015 1812   ALKPHOS 96* 08/07/2015 1047   ALKPHOS 119 06/27/2015 1812   AST 18 08/07/2015 1047   AST 20 06/27/2015 1812   ALT 14 08/07/2015 1047   ALT 17 06/27/2015 1812   BILITOT 0.50 08/07/2015 1047   BILITOT 0.7 06/27/2015 1812     Impression and Plan: Ms. Balsley is a pleasant 65 yo white female with metastatic adenocarcinoma of the lung. She has no "Driver" mutations that we can target.  I am glad that her back pain is doing better. I know that her esophagitis will improve.   I think that her performance status will improve and that she will go to take chemotherapy area and  I think that Abraxane would be reasonable. I think Abraxane with Avastin would be logical. Again she has not had Abraxane.  I talked her about the side effects. I taught  her about the possibility of losing her hair. I don't think that her blood counts be all that affected. She may have some fatigue. She may have some diarrhea. She may have some nausea. I think that overall, she should be able to handled this pretty well.   We will go  ahead and start today.  I think she is physically ready for. I'm going to reduce the dose of Abraxane by 20%. I think this would be very reasonable for her.   I told her that we would recheck her after 3 cycles of treatment area and if after 3 cycles, we do not see a response, then I would forego any further treatment on her. Her quality of life is what matters most to her and I want to preserve this.   I spent about 30 minutes with she and her husband today.   Volanda Napoleon, MD 6/30/20173:27 PM

## 2015-08-12 ENCOUNTER — Encounter: Payer: Self-pay | Admitting: Hematology & Oncology

## 2015-08-12 ENCOUNTER — Encounter (HOSPITAL_BASED_OUTPATIENT_CLINIC_OR_DEPARTMENT_OTHER): Payer: Self-pay

## 2015-08-12 ENCOUNTER — Emergency Department (HOSPITAL_BASED_OUTPATIENT_CLINIC_OR_DEPARTMENT_OTHER): Payer: PPO

## 2015-08-12 ENCOUNTER — Emergency Department (HOSPITAL_BASED_OUTPATIENT_CLINIC_OR_DEPARTMENT_OTHER)
Admission: EM | Admit: 2015-08-12 | Discharge: 2015-08-12 | Disposition: A | Discharge status: Home / Self Care | Payer: PPO | Attending: Emergency Medicine | Admitting: Emergency Medicine

## 2015-08-12 DIAGNOSIS — Z79899 Other long term (current) drug therapy: Secondary | ICD-10-CM | POA: Diagnosis not present

## 2015-08-12 DIAGNOSIS — I10 Essential (primary) hypertension: Secondary | ICD-10-CM | POA: Diagnosis not present

## 2015-08-12 DIAGNOSIS — Z7982 Long term (current) use of aspirin: Secondary | ICD-10-CM | POA: Insufficient documentation

## 2015-08-12 DIAGNOSIS — R509 Fever, unspecified: Secondary | ICD-10-CM | POA: Diagnosis not present

## 2015-08-12 DIAGNOSIS — Z8673 Personal history of transient ischemic attack (TIA), and cerebral infarction without residual deficits: Secondary | ICD-10-CM | POA: Insufficient documentation

## 2015-08-12 DIAGNOSIS — Z85118 Personal history of other malignant neoplasm of bronchus and lung: Secondary | ICD-10-CM | POA: Diagnosis not present

## 2015-08-12 DIAGNOSIS — Z87891 Personal history of nicotine dependence: Secondary | ICD-10-CM | POA: Diagnosis not present

## 2015-08-12 DIAGNOSIS — J0100 Acute maxillary sinusitis, unspecified: Secondary | ICD-10-CM | POA: Diagnosis not present

## 2015-08-12 LAB — I-STAT CG4 LACTIC ACID, ED: LACTIC ACID, VENOUS: 1.69 mmol/L (ref 0.5–1.9)

## 2015-08-12 LAB — CBC WITH DIFFERENTIAL/PLATELET
BASOS PCT: 1 %
Basophils Absolute: 0 10*3/uL (ref 0.0–0.1)
EOS ABS: 0.1 10*3/uL (ref 0.0–0.7)
EOS PCT: 1 %
HCT: 34.8 % — ABNORMAL LOW (ref 36.0–46.0)
Hemoglobin: 11.6 g/dL — ABNORMAL LOW (ref 12.0–15.0)
LYMPHS ABS: 1.1 10*3/uL (ref 0.7–4.0)
Lymphocytes Relative: 14 %
MCH: 28 pg (ref 26.0–34.0)
MCHC: 33.3 g/dL (ref 30.0–36.0)
MCV: 83.9 fL (ref 78.0–100.0)
MONOS PCT: 4 %
Monocytes Absolute: 0.3 10*3/uL (ref 0.1–1.0)
NEUTROS PCT: 80 %
Neutro Abs: 6.3 10*3/uL (ref 1.7–7.7)
PLATELETS: 272 10*3/uL (ref 150–400)
RBC: 4.15 MIL/uL (ref 3.87–5.11)
RDW: 16.3 % — AB (ref 11.5–15.5)
WBC: 7.8 10*3/uL (ref 4.0–10.5)

## 2015-08-12 LAB — URINALYSIS, ROUTINE W REFLEX MICROSCOPIC
Bilirubin Urine: NEGATIVE
Glucose, UA: NEGATIVE mg/dL
Hgb urine dipstick: NEGATIVE
Ketones, ur: NEGATIVE mg/dL
Leukocytes, UA: NEGATIVE
Nitrite: NEGATIVE
Protein, ur: NEGATIVE mg/dL
Specific Gravity, Urine: 1.012 (ref 1.005–1.030)
pH: 6.5 (ref 5.0–8.0)

## 2015-08-12 LAB — COMPREHENSIVE METABOLIC PANEL
ALT: 10 U/L — ABNORMAL LOW (ref 14–54)
AST: 19 U/L (ref 15–41)
Albumin: 3.5 g/dL (ref 3.5–5.0)
Alkaline Phosphatase: 86 U/L (ref 38–126)
Anion gap: 8 (ref 5–15)
BUN: 10 mg/dL (ref 6–20)
CO2: 23 mmol/L (ref 22–32)
Calcium: 8.2 mg/dL — ABNORMAL LOW (ref 8.9–10.3)
Chloride: 102 mmol/L (ref 101–111)
Creatinine, Ser: 0.46 mg/dL (ref 0.44–1.00)
GFR calc Af Amer: >60 mL/min (ref >60)
GFR calc non Af Amer: >60 mL/min (ref >60)
Glucose, Bld: 165 mg/dL — ABNORMAL HIGH (ref 65–99)
Potassium: 3.6 mmol/L (ref 3.5–5.1)
Sodium: 133 mmol/L — ABNORMAL LOW (ref 135–145)
Total Bilirubin: 0.4 mg/dL (ref 0.3–1.2)
Total Protein: 7.3 g/dL (ref 6.5–8.1)

## 2015-08-12 MED ORDER — SODIUM CHLORIDE 0.9 % IV BOLUS (SEPSIS)
1000.0000 mL | Freq: Once | INTRAVENOUS | Status: AC
  Administered 2015-08-12: 1000 mL via INTRAVENOUS

## 2015-08-12 NOTE — ED Provider Notes (Signed)
CSN: [id number]     Arrival date & time 08/12/15  1150 History   First MD Initiated Contact with Patient 08/12/15 1211     Chief Complaint  Patient presents with  . Fever    HPI   65 year old female with a history of metastatic adenocarcinoma of the lungs with bony metastases, lymph node metastases, and intra-pulmonary metastases presents today with fever and complaints of sinus infection. Patient reports that her last chemotherapy was 6 days ago receiving AbraxaneAvastin.  Patient reports that Approximately 11 days ago she started to develop sinus congestion and pressure. Patient reports that the symptoms are somewhat similar to previous sinus infections. The patient also notes mild bloody streaking in nasal discharge; no purulent discharge. Patient denies any cough, congestion, chest pain, diarrhea, neurological complaints. Patient reports she was seen by her primary care provider and started on Ceftin, she has been taking this for 9 days. Patient reports that last night she had a temperature of 101.2, and a temperature of 100 this morning. Patient reports she took Tylenol at approximately 920 this morning..   Past Medical History  Diagnosis Date  . Cancer [facility]) 2005 & 2008    Right Upper Lobe (2005) & Left Upper Lobe (2008). Subsequent Nodules treated empirically in 2010 & 2011 w/ XRT & chemo.  Marland Kitchen Hypertension   . Stroke ([place])   . GERD (gastroesophageal reflux disease)   . B12 deficiency   . Vitamin D deficiency   . Lung cancer, primary, with metastasis from lung to other site [facility]) 04/01/2015  . Pleural effusion, malignant 04/01/2015   Past Surgical History  Procedure Laterality Date  . Vaginal hysterectomy  1983  . Cyst removed from ovary  1968  . Lung removal, partial  2005, 2008    R upper lobe and left upper lobe wedge  . Bronchoscopy    . Mediastinoscopy    . Cholecystectomy    . Appendectomy    . Hiatal hernia repair  2010   Family History  Problem Relation Age of Onset   . Heart attack Father   . Hypertension Mother   . Diabetes Mother   . Congestive Heart Failure Mother   . Breast cancer Sister   . Lung disease Neg Hx    Social History  Substance Use Topics  . Smoking status: Former Smoker -- 2.00 packs/day for 33 years    Types: Cigarettes    Start date: 07/19/1967    Quit date: 01/23/2003  . Smokeless tobacco: Never Used  . Alcohol Use: No   OB History    No data available     Review of Systems  All other systems reviewed and are negative.     Allergies  Demerol; Mefoxin; Topamax; and Avelox  Home Medications   Prior to Admission medications   Medication Sig Start Date End Date Taking? Authorizing Provider  aspirin 81 MG tablet Take 81 mg by mouth daily.      Historical Provider, MD  budesonide-formoterol (SYMBICORT) 160-4.5 MCG/ACT inhaler Inhale 2 puffs into the lungs 2 (two) times daily.    Historical Provider, MD  calcium-vitamin D (OSCAL) 250-125 MG-UNIT per tablet Take 1 tablet by mouth daily.      Historical Provider, MD  cefUROXime (CEFTIN) 500 MG tablet  08/04/15   Historical Provider, MD  Cholecalciferol (VITAMIN D) 2000 UNITS tablet Take 2,000 Units by mouth daily.    Historical Provider, MD  Echinacea 400 MG CAPS Take 400 mg by mouth daily.  Historical Provider, MD  fish oil-omega-3 fatty acids 1000 MG capsule Take 2 g by mouth daily.      Historical Provider, MD  folic acid (FOLVITE) 1 MG tablet Take 1 mg by mouth daily.    Historical Provider, MD  gabapentin (NEURONTIN) 300 MG capsule Take 1 capsule (300 mg total) by mouth 3 (three) times daily. 07/09/15   Gery Pray, MD  HYDROcodone-homatropine [facility]) 5-1.5 MG/5ML syrup Take 5 mLs by mouth every 6 (six) hours as needed for cough. 07/28/15   Volanda Napoleon, MD  KLOR-CON M20 20 MEQ tablet TAKE 1 TABLET BY MOUTH DAILY Patient taking differently: TAKE 20 MEQ BY MOUTH DAILY 12/10/14   Volanda Napoleon, MD  lactulose (CHRONULAC) 10 GM/15ML solution Take 10 g by mouth 3  (three) times daily. Reported on 07/22/2015    Historical Provider, MD  lidocaine (XYLOCAINE) 2 % solution Use as directed 15 mLs in the mouth or throat as needed for mouth pain. 07/14/15   Gery Pray, MD  lidocaine-prilocaine (EMLA) cream Apply topically.    Historical Provider, MD  omeprazole (PRILOSEC) 40 MG capsule Take 40 mg by mouth 2 (two) times daily.  12/05/13   Historical Provider, MD  oxyCODONE (OXYCONTIN) 10 mg 12 hr tablet Take 1 tablet (10 mg total) by mouth every 12 (twelve) hours. 06/23/15   Volanda Napoleon, MD  oxyCODONE (ROXICODONE INTENSOL) 20 MG/ML concentrated solution Take 0.3 mLs (6 mg total) by mouth every 2 (two) hours as needed for severe pain. 07/24/15   Volanda Napoleon, MD  polyethylene glycol powder (GLYCOLAX/MIRALAX) powder Take 17 g by mouth once. Dissolve 1 capful of powder into any liquid and take once daily. Can increase to 2 times daily if no effect after 3-4 days. Patient taking differently: Take 17 g by mouth daily. Dissolve 1 capful of powder into any liquid and take once daily. Can increase to 2 times daily if no effect after 3-4 days. 06/27/15   Forde Dandy, MD  PROAIR RESPICLICK 109 [phone] BASE) MCG/ACT AEPB Inhale 2 puffs into the lungs twice daily as needed for shortness of breath or wheezing 10/11/14   Historical Provider, MD  prochlorperazine (COMPAZINE) 10 MG tablet Take 10 mg by mouth every 6 (six) hours as needed for nausea or vomiting.  12/26/14   Historical Provider, MD  senna-docusate (SENOKOT-S) 8.6-50 MG tablet Take 1 tablet by mouth daily. 06/27/15   Forde Dandy, MD  SUMAtriptan (IMITREX) 100 MG tablet Take 100 mg by mouth as needed for migraine or headache. Reported on 04/01/2015    Historical Provider, MD  Wound Dressings (SONAFINE EX) Apply topically.    Historical Provider, MD   BP 136/86 mmHg  Pulse 83  Temp(Src) 98.2 F (36.8 C) (Oral)  Resp 20  Ht '5\' 1"'$  (1.549 m)  Wt 56.7 kg  BMI 23.63 kg/m2  SpO2 98% Physical Exam  Constitutional: She is  oriented to person, place, and time. She appears well-developed and well-nourished.  HENT:  Head: Normocephalic and atraumatic.  Right Ear: Hearing and tympanic membrane normal.  Left Ear: Hearing and tympanic membrane normal.  Nose: No mucosal edema. Right sinus exhibits maxillary sinus tenderness and frontal sinus tenderness. Left sinus exhibits maxillary sinus tenderness and frontal sinus tenderness.  Mouth/Throat: Uvula is midline and oropharynx is clear and moist. No oropharyngeal exudate, posterior oropharyngeal edema or posterior oropharyngeal erythema.  Eyes: Conjunctivae are normal. Pupils are equal, round, and reactive to light. Right eye exhibits no discharge. Left  eye exhibits no discharge. No scleral icterus.  Neck: Normal range of motion. No JVD present. No tracheal deviation present.  Cardiovascular: Normal rate.   Pulmonary/Chest: Effort normal and breath sounds normal. No stridor. No respiratory distress. She has no wheezes. She has no rales. She exhibits no tenderness.  Neurological: She is alert and oriented to person, place, and time. Coordination normal.  Skin: Skin is warm and dry. No erythema. No pallor.  Psychiatric: She has a normal mood and affect. Her behavior is normal. Judgment and thought content normal.  Nursing note and vitals reviewed.   ED Course  Procedures (including critical care time) Labs Review Labs Reviewed  COMPREHENSIVE METABOLIC PANEL - Abnormal; Notable for the following:    Sodium 133 (*)    Glucose, Bld 165 (*)    Calcium 8.2 (*)    ALT 10 (*)    All other components within normal limits  CBC WITH DIFFERENTIAL/PLATELET - Abnormal; Notable for the following:    Hemoglobin 11.6 (*)    HCT 34.8 (*)    RDW 16.3 (*)    All other components within normal limits  CULTURE, BLOOD (ROUTINE X 2)  CULTURE, BLOOD (ROUTINE X 2)  URINE CULTURE  URINALYSIS, ROUTINE W REFLEX MICROSCOPIC (NOT AT [facility])  I-STAT CG4 LACTIC ACID, ED    Imaging  Review Dg Chest 2 View  08/12/2015  CLINICAL DATA:  Fever since yesterday. History of lung cancer. The patient started a new chemotherapy regimen 08/07/2015. EXAM: CHEST  2 VIEW COMPARISON:  PA and lateral chest 04/22/2015. Single view of the chest 04/04/2015. PET CT scan 07/20/2015. FINDINGS: Port-A-Cath remains in place. Right lower lobe airspace opacity is unchanged. Airspace disease in the left lower lobe is also stable in appearance. No new airspace opacity is identified. The lungs are emphysematous. Heart size is normal. IMPRESSION: No acute disease. No change in the appearance of multifocal lung carcinoma as seen on prior examinations. Electronically Signed   By: Inge Rise M.D.   On: 08/12/2015 12:55   I have personally reviewed and evaluated these images and lab results as part of my medical decision-making.   EKG Interpretation None      MDM   Final diagnoses:  Acute maxillary sinusitis, recurrence not specified    Labs:Urinalysis, i-STAT lactic acid, blood culture, CMP, CBC  Imaging: DG chest 2 view  Consults:  Therapeutics:  Discharge Meds:   Assessment/Plan:  65 year old female presents today with likely sinusitis. Patient reports fever last night, she is afebrile nontoxic in no acute distress here in the ED. She has no significant findings on her vital signs, laboratory analysis, or physical exam that would necessitate further evaluation or management here in the ED. Patient's oncologist was consulted, labs were discussed, instructed to have patient discontinue taking Ceftin, follow-up tomorrow for regularly scheduled chemotherapy. Patient has no other findings that would indicate infectious etiology other than the sinus related complaints. Patient be discharged home with symptomatic care instructions, strict return precautions. Patient verbalized understanding and agreement to today's plan had no further questions or concerns at the time  discharge        Okey Regal, PA-C 08/12/15 [address], PA-C 08/12/15 [place] Liu, MD 08/13/15 [phone]

## 2015-08-12 NOTE — ED Notes (Signed)
Patient transported to X-ray 

## 2015-08-12 NOTE — ED Notes (Addendum)
C/o fever started yesterday-was dx by PCP with sinus infection last week and started on abx-pt states she is concerned b/c she started a new chemo last week-last tylenol at 920am with fever 100.0 at that time-NAD-presents to triage in w/c

## 2015-08-12 NOTE — ED Notes (Signed)
Pt reports having 3 more doses left of abx for sinus infection. Pt states fever and symptoms persist. Pt reports dry cough and congestion. Endorses SOB today, worse with exertion. Pt had chemo last Friday and due for another treatment tomorrow. Denies N/V.

## 2015-08-12 NOTE — Discharge Instructions (Signed)
Please follow-up with your oncologist tomorrow for further evaluation and management. Please return immediately if any new or worsening signs or symptoms present.  Sinusitis, Adult Sinusitis is redness, soreness, and inflammation of the paranasal sinuses. Paranasal sinuses are air pockets within the bones of your face. They are located beneath your eyes, in the middle of your forehead, and above your eyes. In healthy paranasal sinuses, mucus is able to drain out, and air is able to circulate through them by way of your nose. However, when your paranasal sinuses are inflamed, mucus and air can become trapped. This can allow bacteria and other germs to grow and cause infection. Sinusitis can develop quickly and last only a short time (acute) or continue over a long period (chronic). Sinusitis that lasts for more than 12 weeks is considered chronic. CAUSES Causes of sinusitis include:  Allergies.  Structural abnormalities, such as displacement of the cartilage that separates your nostrils (deviated septum), which can decrease the air flow through your nose and sinuses and affect sinus drainage.  Functional abnormalities, such as when the small hairs (cilia) that line your sinuses and help remove mucus do not work properly or are not present. SIGNS AND SYMPTOMS Symptoms of acute and chronic sinusitis are the same. The primary symptoms are pain and pressure around the affected sinuses. Other symptoms include:  Upper toothache.  Earache.  Headache.  Bad breath.  Decreased sense of smell and taste.  A cough, which worsens when you are lying flat.  Fatigue.  Fever.  Thick drainage from your nose, which often is green and may contain pus (purulent).  Swelling and warmth over the affected sinuses. DIAGNOSIS Your health care provider will perform a physical exam. During your exam, your health care provider may perform any of the following to help determine if you have acute sinusitis or  chronic sinusitis:  Look in your nose for signs of abnormal growths in your nostrils (nasal polyps).  Tap over the affected sinus to check for signs of infection.  View the inside of your sinuses using an imaging device that has a light attached (endoscope). If your health care provider suspects that you have chronic sinusitis, one or more of the following tests may be recommended:  Allergy tests.  Nasal culture. A sample of mucus is taken from your nose, sent to a lab, and screened for bacteria.  Nasal cytology. A sample of mucus is taken from your nose and examined by your health care provider to determine if your sinusitis is related to an allergy. TREATMENT Most cases of acute sinusitis are related to a viral infection and will resolve on their own within 10 days. Sometimes, medicines are prescribed to help relieve symptoms of both acute and chronic sinusitis. These may include pain medicines, decongestants, nasal steroid sprays, or saline sprays. However, for sinusitis related to a bacterial infection, your health care provider will prescribe antibiotic medicines. These are medicines that will help kill the bacteria causing the infection. Rarely, sinusitis is caused by a fungal infection. In these cases, your health care provider will prescribe antifungal medicine. For some cases of chronic sinusitis, surgery is needed. Generally, these are cases in which sinusitis recurs more than 3 times per year, despite other treatments. HOME CARE INSTRUCTIONS  Drink plenty of water. Water helps thin the mucus so your sinuses can drain more easily.  Use a humidifier.  Inhale steam 3-4 times a day (for example, sit in the bathroom with the shower running).  Apply a warm,  moist washcloth to your face 3-4 times a day, or as directed by your health care provider.  Use saline nasal sprays to help moisten and clean your sinuses.  Take medicines only as directed by your health care provider.  If  you were prescribed either an antibiotic or antifungal medicine, finish it all even if you start to feel better. SEEK IMMEDIATE MEDICAL CARE IF:  You have increasing pain or severe headaches.  You have nausea, vomiting, or drowsiness.  You have swelling around your face.  You have vision problems.  You have a stiff neck.  You have difficulty breathing.   This information is not intended to replace advice given to you by your health care provider. Make sure you discuss any questions you have with your health care provider.   Document Released: 01/24/2005 Document Revised: 02/14/2014 Document Reviewed: 02/08/2011 Elsevier Interactive Patient Education [employer].

## 2015-08-13 ENCOUNTER — Other Ambulatory Visit: Payer: PPO

## 2015-08-13 ENCOUNTER — Ambulatory Visit: Payer: PPO | Admitting: Family

## 2015-08-13 ENCOUNTER — Ambulatory Visit (HOSPITAL_BASED_OUTPATIENT_CLINIC_OR_DEPARTMENT_OTHER): Payer: PPO

## 2015-08-13 ENCOUNTER — Other Ambulatory Visit: Payer: Self-pay | Admitting: Hematology & Oncology

## 2015-08-13 VITALS — BP 102/70 | HR 99 | Temp 98.0°F | Resp 18 | Wt 125.0 lb

## 2015-08-13 DIAGNOSIS — J322 Chronic ethmoidal sinusitis: Secondary | ICD-10-CM

## 2015-08-13 DIAGNOSIS — J018 Other acute sinusitis: Secondary | ICD-10-CM

## 2015-08-13 DIAGNOSIS — C7951 Secondary malignant neoplasm of bone: Secondary | ICD-10-CM

## 2015-08-13 MED ORDER — METHYLPREDNISOLONE 4 MG PO TBPK: ORAL_TABLET | ORAL | Status: DC

## 2015-08-13 MED ORDER — AMPICILLIN-SULBACTAM SODIUM 3 (2-1) G IJ SOLR
3.0000 g | Freq: Once | INTRAMUSCULAR | Status: AC
  Administered 2015-08-13: 3 g via INTRAVENOUS
  Filled 2015-08-13: qty 3

## 2015-08-13 MED ORDER — AMOXICILLIN-POT CLAVULANATE 875-125 MG PO TABS: 1.0000 | ORAL_TABLET | Freq: Two times a day (BID) | ORAL | Status: DC

## 2015-08-13 MED ORDER — METHYLPREDNISOLONE SODIUM SUCC 125 MG IJ SOLR
125.0000 mg | Freq: Once | INTRAMUSCULAR | Status: AC
  Administered 2015-08-13: 125 mg via INTRAVENOUS

## 2015-08-13 MED ORDER — METHYLPREDNISOLONE SODIUM SUCC 125 MG IJ SOLR
INTRAMUSCULAR | Status: AC
  Filled 2015-08-13: qty 2

## 2015-08-13 MED FILL — METHYLPREDNISOLONE 4 MG TAB: 4 | 6 days supply | Qty: 21 | Fill #0

## 2015-08-13 MED FILL — AMOX-CLAV 875-125 MG TABLET: 875-125 | 10 days supply | Qty: 20 | Fill #0

## 2015-08-13 NOTE — Patient Instructions (Signed)
Ampicillin; Sulbactam injection What is this medicine? AMPICILLIN; SULBACTAM (am pi SILL in; sul BAK tam) is a penicillin antibiotic. It is used to treat certain kinds of bacterial infections. It will not work for colds, flu, or other viral infections. This medicine may be used for other purposes; ask your health care provider or pharmacist if you have questions. What should I tell my health care provider before I take this medicine? They need to know if you have any of these conditions: -heart disease -kidney disease -mononucleosis -an unusual or allergic reaction to ampicillin, other penicillins or antibiotics, foods, dyes, or preservatives -pregnant or trying to get pregnant -breast-feeding How should I use this medicine? This medicine is infused into a vein or injected deep into a muscle. It is usually given by a health care professional in a hospital or clinic setting. If you get this medicine at home, you will be taught how to prepare and give this medicine. Use exactly as directed. Take your medicine at regular intervals. Do not take your medicine more often than directed. It is important that you put your used needles and syringes in a special sharps container. Do not put them in a trash can. If you do not have a sharps container, call your pharmacist or healthcare provider to get one. Talk to your pediatrician regarding the use of this medicine in children. Special care may be needed. Overdosage: If you think you have taken too much of this medicine contact a poison control center or emergency room at once. NOTE: This medicine is only for you. Do not share this medicine with others. What if I miss a dose? If you miss a dose, take it as soon as you can. If it is almost time for your next dose, take only that dose. Do not take double or extra doses. What may interact with this medicine? -allopurinol -female hormones, including contraceptive or birth control pills -probenecid -some  other antibiotics given by injection This list may not describe all possible interactions. Give your health care provider a list of all the medicines, herbs, non-prescription drugs, or dietary supplements you use. Also tell them if you smoke, drink alcohol, or use illegal drugs. Some items may interact with your medicine. What should I watch for while using this medicine? Tell your doctor or health care professional if your symptoms do not improve or if you get new symptoms. Do not treat diarrhea with over the counter products. Contact your doctor if you have diarrhea that lasts more than 2 days or if the diarrhea is severe and watery. This medicine can interfere with some urine glucose tests. If you use such tests, talk with your health care professional. Birth control pills may not work properly while you are taking this medicine. Talk to your doctor about using an extra method of birth control. What side effects may I notice from receiving this medicine? Side effects that you should report to your doctor or health care professional as soon as possible: -allergic reactions like skin rash or hives, swelling of the face, lips, or tongue -chest pain -difficulty breathing -fever, chills -pain or difficulty passing urine -redness, blistering, peeling or loosening of the skin, including inside the mouth -seizures -unusual bleeding, bruising -unusually weak or tired Side effects that usually do not require medical attention (report to your doctor or health care professional if they continue or are bothersome): -diarrhea -headache -heartburn -nausea, vomiting -pain, irritation at the site of injection -sore mouth, tongue -stomach gas This list  may not describe all possible side effects. Call your doctor for medical advice about side effects. You may report side effects to FDA at 1-800-FDA-1088. Where should I keep my medicine? Keep out of the reach of children. You will be instructed on how to  store this medicine. Throw away any unused medicine after the expiration date on the label. NOTE: This sheet is a summary. It may not cover all possible information. If you have questions about this medicine, talk to your doctor, pharmacist, or health care provider.    2016, Elsevier/Gold Standard. (2007-04-17 14:37:17)

## 2015-08-15 LAB — URINE CULTURE: Culture: >=100000 — AB

## 2015-08-16 ENCOUNTER — Telehealth (HOSPITAL_BASED_OUTPATIENT_CLINIC_OR_DEPARTMENT_OTHER): Payer: Self-pay

## 2015-08-17 LAB — CULTURE, BLOOD (ROUTINE X 2)
Culture: NO GROWTH
Culture: NO GROWTH

## 2015-08-19 ENCOUNTER — Ambulatory Visit (HOSPITAL_BASED_OUTPATIENT_CLINIC_OR_DEPARTMENT_OTHER): Payer: PPO | Admitting: Hematology & Oncology

## 2015-08-19 ENCOUNTER — Ambulatory Visit (HOSPITAL_BASED_OUTPATIENT_CLINIC_OR_DEPARTMENT_OTHER): Payer: PPO

## 2015-08-19 ENCOUNTER — Encounter: Payer: Self-pay | Admitting: Hematology & Oncology

## 2015-08-19 ENCOUNTER — Other Ambulatory Visit (HOSPITAL_BASED_OUTPATIENT_CLINIC_OR_DEPARTMENT_OTHER): Payer: PPO

## 2015-08-19 VITALS — BP 133/90 | HR 90 | Temp 98.0°F | Resp 16 | Ht 61.0 in | Wt 124.0 lb

## 2015-08-19 DIAGNOSIS — C7951 Secondary malignant neoplasm of bone: Secondary | ICD-10-CM

## 2015-08-19 DIAGNOSIS — J910 Malignant pleural effusion: Secondary | ICD-10-CM | POA: Diagnosis not present

## 2015-08-19 DIAGNOSIS — C3490 Malignant neoplasm of unspecified part of unspecified bronchus or lung: Secondary | ICD-10-CM | POA: Diagnosis not present

## 2015-08-19 DIAGNOSIS — Z5111 Encounter for antineoplastic chemotherapy: Secondary | ICD-10-CM | POA: Diagnosis not present

## 2015-08-19 DIAGNOSIS — Z5112 Encounter for antineoplastic immunotherapy: Secondary | ICD-10-CM

## 2015-08-19 DIAGNOSIS — C3491 Malignant neoplasm of unspecified part of right bronchus or lung: Secondary | ICD-10-CM

## 2015-08-19 DIAGNOSIS — C3492 Malignant neoplasm of unspecified part of left bronchus or lung: Secondary | ICD-10-CM

## 2015-08-19 LAB — CMP (CANCER CENTER ONLY)
ALT(SGPT): 17 U/L (ref 10–47)
AST: 18 U/L (ref 11–38)
Albumin: 3.4 g/dL (ref 3.3–5.5)
Alkaline Phosphatase: 109 U/L — ABNORMAL HIGH (ref 26–84)
BUN: 24 mg/dL — AB (ref 7–22)
CHLORIDE: 102 meq/L (ref 98–108)
CO2: 26 meq/L (ref 18–33)
CREATININE: 0.7 mg/dL (ref 0.6–1.2)
Calcium: 8.7 mg/dL (ref 8.0–10.3)
Glucose, Bld: 99 mg/dL (ref 73–118)
Potassium: 4.1 mEq/L (ref 3.3–4.7)
SODIUM: 137 meq/L (ref 128–145)
TOTAL PROTEIN: 7.3 g/dL (ref 6.4–8.1)
Total Bilirubin: 0.5 mg/dl (ref 0.20–1.60)

## 2015-08-19 LAB — CBC WITH DIFFERENTIAL (CANCER CENTER ONLY)
BASO#: 0 10*3/uL (ref 0.0–0.2)
BASO%: 0.4 % (ref 0.0–2.0)
EOS ABS: 0 10*3/uL (ref 0.0–0.5)
EOS%: 0.4 % (ref 0.0–7.0)
HEMATOCRIT: 37.5 % (ref 34.8–46.6)
HGB: 12.3 g/dL (ref 11.6–15.9)
LYMPH#: 1.7 10*3/uL (ref 0.9–3.3)
LYMPH%: 23.4 % (ref 14.0–48.0)
MCH: 27.9 pg (ref 26.0–34.0)
MCHC: 32.8 g/dL (ref 32.0–36.0)
MCV: 85 fL (ref 81–101)
MONO#: 0.7 10*3/uL (ref 0.1–0.9)
MONO%: 9.5 % (ref 0.0–13.0)
NEUT#: 4.8 10*3/uL (ref 1.5–6.5)
NEUT%: 66.3 % (ref 39.6–80.0)
PLATELETS: 424 10*3/uL — AB (ref 145–400)
RBC: 4.41 10*6/uL (ref 3.70–5.32)
RDW: 17.5 % — ABNORMAL HIGH (ref 11.1–15.7)
WBC: 7.3 10*3/uL (ref 3.9–10.0)

## 2015-08-19 LAB — TECHNOLOGIST REVIEW CHCC SATELLITE

## 2015-08-19 MED ORDER — SODIUM CHLORIDE 0.9 % IV SOLN
10.0000 mg/kg | Freq: Once | INTRAVENOUS | Status: AC
  Administered 2015-08-19: 575 mg via INTRAVENOUS
  Filled 2015-08-19: qty 19

## 2015-08-19 MED ORDER — PROCHLORPERAZINE MALEATE 10 MG PO TABS
10.0000 mg | ORAL_TABLET | Freq: Once | ORAL | Status: AC
  Administered 2015-08-19: 10 mg via ORAL

## 2015-08-19 MED ORDER — PACLITAXEL PROTEIN-BOUND CHEMO INJECTION 100 MG
81.0000 mg/m2 | Freq: Once | INTRAVENOUS | Status: AC
  Administered 2015-08-19: 125 mg via INTRAVENOUS
  Filled 2015-08-19: qty 25

## 2015-08-19 MED ORDER — HEPARIN SOD (PORK) LOCK FLUSH 100 UNIT/ML IV SOLN
500.0000 [IU] | Freq: Once | INTRAVENOUS | Status: AC | PRN
  Administered 2015-08-19: 500 [IU]
  Filled 2015-08-19: qty 5

## 2015-08-19 MED ORDER — PROCHLORPERAZINE MALEATE 10 MG PO TABS
ORAL_TABLET | ORAL | Status: AC
  Filled 2015-08-19: qty 1

## 2015-08-19 MED ORDER — SODIUM CHLORIDE 0.9 % IV SOLN
Freq: Once | INTRAVENOUS | Status: AC
  Administered 2015-08-19: 13:00:00 via INTRAVENOUS

## 2015-08-19 MED ORDER — SODIUM CHLORIDE 0.9% FLUSH
10.0000 mL | INTRAVENOUS | Status: DC | PRN
  Administered 2015-08-19: 10 mL
  Filled 2015-08-19: qty 10

## 2015-08-19 NOTE — Patient Instructions (Addendum)
Nanoparticle Albumin-Bound Paclitaxel injection What is this medicine? NANOPARTICLE ALBUMIN-BOUND PACLITAXEL (Na no PAHR ti kuhl al BYOO muhn-bound PAK li TAX el) is a chemotherapy drug. It targets fast dividing cells, like cancer cells, and causes these cells to die. This medicine is used to treat advanced breast cancer and advanced lung cancer. This medicine may be used for other purposes; ask your health care provider or pharmacist if you have questions. What should I tell my health care provider before I take this medicine? They need to know if you have any of these conditions: -kidney disease -liver disease -low blood counts, like low platelets, red blood cells, or white blood cells -recent or ongoing radiation therapy -an unusual or allergic reaction to paclitaxel, albumin, other chemotherapy, other medicines, foods, dyes, or preservatives -pregnant or trying to get pregnant -breast-feeding How should I use this medicine? This drug is given as an infusion into a vein. It is administered in a hospital or clinic by a specially trained health care professional. Talk to your pediatrician regarding the use of this medicine in children. Special care may be needed. Overdosage: If you think you have taken too much of this medicine contact a poison control center or emergency room at once. NOTE: This medicine is only for you. Do not share this medicine with others. What if I miss a dose? It is important not to miss your dose. Call your doctor or health care professional if you are unable to keep an appointment. What may interact with this medicine? -cyclosporine -diazepam -ketoconazole -medicines to increase blood counts like filgrastim, pegfilgrastim, sargramostim -other chemotherapy drugs like cisplatin, doxorubicin, epirubicin, etoposide, teniposide, vincristine -quinidine -testosterone -vaccines -verapamil Talk to your doctor or health care professional before taking any of these  medicines: -acetaminophen -aspirin -ibuprofen -ketoprofen -naproxen This list may not describe all possible interactions. Give your health care provider a list of all the medicines, herbs, non-prescription drugs, or dietary supplements you use. Also tell them if you smoke, drink alcohol, or use illegal drugs. Some items may interact with your medicine. What should I watch for while using this medicine? Your condition will be monitored carefully while you are receiving this medicine. You will need important blood work done while you are taking this medicine. This drug may make you feel generally unwell. This is not uncommon, as chemotherapy can affect healthy cells as well as cancer cells. Report any side effects. Continue your course of treatment even though you feel ill unless your doctor tells you to stop. In some cases, you may be given additional medicines to help with side effects. Follow all directions for their use. Call your doctor or health care professional for advice if you get a fever, chills or sore throat, or other symptoms of a cold or flu. Do not treat yourself. This drug decreases your body's ability to fight infections. Try to avoid being around people who are sick. This medicine may increase your risk to bruise or bleed. Call your doctor or health care professional if you notice any unusual bleeding. Be careful brushing and flossing your teeth or using a toothpick because you may get an infection or bleed more easily. If you have any dental work done, tell your dentist you are receiving this medicine. Avoid taking products that contain aspirin, acetaminophen, ibuprofen, naproxen, or ketoprofen unless instructed by your doctor. These medicines may hide a fever. Do not become pregnant while taking this medicine. Women should inform their doctor if they wish to become pregnant or  think they might be pregnant. There is a potential for serious side effects to an unborn child. Talk to  your health care professional or pharmacist for more information. Do not breast-feed an infant while taking this medicine. Men are advised not to father a child while receiving this medicine. What side effects may I notice from receiving this medicine? Side effects that you should report to your doctor or health care professional as soon as possible: -allergic reactions like skin rash, itching or hives, swelling of the face, lips, or tongue -low blood counts - This drug may decrease the number of white blood cells, red blood cells and platelets. You may be at increased risk for infections and bleeding. -signs of infection - fever or chills, cough, sore throat, pain or difficulty passing urine -signs of decreased platelets or bleeding - bruising, pinpoint red spots on the skin, black, tarry stools, nosebleeds -signs of decreased red blood cells - unusually weak or tired, fainting spells, lightheadedness -breathing problems -changes in vision -chest pain -high or low blood pressure -mouth sores -nausea and vomiting -pain, swelling, redness or irritation at the injection site -pain, tingling, numbness in the hands or feet -slow or irregular heartbeat -swelling of the ankle, feet, hands Side effects that usually do not require medical attention (report to your doctor or health care professional if they continue or are bothersome): -aches, pains -changes in the color of fingernails -diarrhea -hair loss -loss of appetite This list may not describe all possible side effects. Call your doctor for medical advice about side effects. You may report side effects to FDA at 1-800-FDA-1088. Where should I keep my medicine? This drug is given in a hospital or clinic and will not be stored at home. NOTE: This sheet is a summary. It may not cover all possible information. If you have questions about this medicine, talk to your doctor, pharmacist, or health care provider.    2016, Elsevier/Gold Standard.  (2012-03-19 16:48:50)   Bevacizumab injection What is this medicine? BEVACIZUMAB (be va SIZ yoo mab) is a monoclonal antibody. It is used to treat cervical cancer, colorectal cancer, glioblastoma multiforme, non-small cell lung cancer (NSCLC), ovarian cancer, and renal cell cancer. This medicine may be used for other purposes; ask your health care provider or pharmacist if you have questions. What should I tell my health care provider before I take this medicine? They need to know if you have any of these conditions: -blood clots -heart disease, including heart failure, heart attack, or chest pain (angina) -high blood pressure -infection (especially a virus infection such as chickenpox, cold sores, or herpes) -kidney disease -lung disease -prior chemotherapy with doxorubicin, daunorubicin, epirubicin, or other anthracycline type chemotherapy agents -recent or ongoing radiation therapy -recent surgery -stroke -an unusual or allergic reaction to bevacizumab, hamster proteins, mouse proteins, other medicines, foods, dyes, or preservatives -pregnant or trying to get pregnant -breast-feeding How should I use this medicine? This medicine is for infusion into a vein. It is given by a health care professional in a hospital or clinic setting. Talk to your pediatrician regarding the use of this medicine in children. Special care may be needed. Overdosage: If you think you have taken too much of this medicine contact a poison control center or emergency room at once. NOTE: This medicine is only for you. Do not share this medicine with others. What if I miss a dose? It is important not to miss your dose. Call your doctor or health care professional if you are  unable to keep an appointment. What may interact with this medicine? Interactions are not expected. This list may not describe all possible interactions. Give your health care provider a list of all the medicines, herbs, non-prescription  drugs, or dietary supplements you use. Also tell them if you smoke, drink alcohol, or use illegal drugs. Some items may interact with your medicine. What should I watch for while using this medicine? Your condition will be monitored carefully while you are receiving this medicine. You will need important blood work and urine testing done while you are taking this medicine. During your treatment, let your health care professional know if you have any unusual symptoms, such as difficulty breathing. This medicine may rarely cause 'gastrointestinal perforation' (holes in the stomach, intestines or colon), a serious side effect requiring surgery to repair. This medicine should be started at least 28 days following major surgery and the site of the surgery should be totally healed. Check with your doctor before scheduling dental work or surgery while you are receiving this treatment. Talk to your doctor if you have recently had surgery or if you have a wound that has not healed. Do not become pregnant while taking this medicine or for 6 months after stopping it. Women should inform their doctor if they wish to become pregnant or think they might be pregnant. There is a potential for serious side effects to an unborn child. Talk to your health care professional or pharmacist for more information. Do not breast-feed an infant while taking this medicine. This medicine has caused ovarian failure in some women. This medicine may interfere with the ability to have a child. You should talk to your doctor or health care professional if you are concerned about your fertility. What side effects may I notice from receiving this medicine? Side effects that you should report to your doctor or health care professional as soon as possible: -allergic reactions like skin rash, itching or hives, swelling of the face, lips, or tongue -signs of infection - fever or chills, cough, sore throat, pain or trouble passing urine -signs  of decreased platelets or bleeding - bruising, pinpoint red spots on the skin, black, tarry stools, nosebleeds, blood in the urine -breathing problems -changes in vision -chest pain -confusion -jaw pain, especially after dental work -mouth sores -seizures -severe abdominal pain -severe headache -sudden numbness or weakness of the face, arm or leg -swelling of legs or ankles -symptoms of a stroke: change in mental awareness, inability to talk or move one side of the body (especially in patients with lung cancer) -trouble passing urine or change in the amount of urine -trouble speaking or understanding -trouble walking, dizziness, loss of balance or coordination Side effects that usually do not require medical attention (report to your doctor or health care professional if they continue or are bothersome): -constipation -diarrhea -dry skin -headache -loss of appetite -nausea, vomiting This list may not describe all possible side effects. Call your doctor for medical advice about side effects. You may report side effects to FDA at 1-800-FDA-1088. Where should I keep my medicine? This drug is given in a hospital or clinic and will not be stored at home. NOTE: This sheet is a summary. It may not cover all possible information. If you have questions about this medicine, talk to your doctor, pharmacist, or health care provider.    2016, Elsevier/Gold Standard. (2014-03-25 16:58:44)

## 2015-08-19 NOTE — Progress Notes (Signed)
This is a  Hematology and Oncology Follow Up Visit  [name] [id number] 1950/03/06 65 y.o. 08/19/2015   Principle Diagnosis:  Metastatic adenocarcinoma the lung - bone metastases, lymph node metastasis and intrapulmonary metastasis Malignant left pleural effusion  Current Therapy:  AbraxaneAvastin -to complete cycle #1 of chemo today Navelbine/Avastin q 2wk dosing - s/p c#4 - progression or a normal allergies he is suggestive one [place] [facility] will Tecentriq s/p cycle 4 Xgeva 120 mg subcutaneous every month Palliative radiation therapy to the neck.    Interim History:  Ms. Fishman is here today with her husband. She is feeling better. Her odynophagia is improving. She is eating much better. She is swallowing better. She is quite hoarse. This might be from the radiation. It also could be from adenopathy in her chest pressing on the recurrent laryngeal nerve.  Thankfully, everything is looking better with respect to her great-grandchild. Hopefully, her daughter will be oh to keep her. There will be another court date in 2 weeks.  She has had no issues with pain. She is on oxycodone. This does seem to be helping her quite a bit. She occasionally has some shoulder blade issues, more so on the left than on the right.  She's had no issues with diarrhea. She's had no mouth sores. She's had no cough. She's had hoarseness which I suspect is probably from mediastinal disease in the chest.  She is not sleeping much. I did go ahead and put her on some prednisone last time she was here. I though this might help her out. She's not sleeping well. I want sure she wants anything try to help with sleep.  She is not lost her hair with the Abraxane.   Currently, her performance status is ECOG 1..   Medications:    Medication List       This list is accurate as of: 08/19/15 12:31 PM.  Always use your most recent med list.               amoxicillin-clavulanate 875-125 MG tablet  Commonly  known as:  AUGMENTIN  Take 1 tablet by mouth 2 (two) times daily.     aspirin 81 MG tablet  Take 81 mg by mouth daily.     budesonide-formoterol 160-4.5 MCG/ACT inhaler  Commonly known as:  SYMBICORT  Inhale 2 puffs into the lungs 2 (two) times daily.     calcium-vitamin D 250-125 MG-UNIT tablet  Commonly known as:  OSCAL  Take 1 tablet by mouth daily.     Echinacea 400 MG Caps  Take 400 mg by mouth daily.     fish oil-omega-3 fatty acids 1000 MG capsule  Take 2 g by mouth daily.     folic acid 1 MG tablet  Commonly known as:  FOLVITE  Take 1 mg by mouth daily.     gabapentin 300 MG capsule  Commonly known as:  NEURONTIN  Take 1 capsule (300 mg total) by mouth 3 (three) times daily.     HYDROcodone-homatropine 5-1.5 MG/5ML syrup  Commonly known as:  HYCODAN  Take 5 mLs by mouth every 6 (six) hours as needed for cough.     KLOR-CON M20 20 MEQ tablet  Generic drug:  potassium chloride SA  TAKE 1 TABLET BY MOUTH DAILY     lactulose 10 GM/15ML solution  Commonly known as:  CHRONULAC  Take 10 g by mouth 3 (three) times daily. Reported on 07/22/2015     lidocaine 2 % solution  Commonly known as:  XYLOCAINE  Use as directed 15 mLs in the mouth or throat as needed for mouth pain.     lidocaine-prilocaine cream  Commonly known as:  EMLA  Apply topically.     methylPREDNISolone 4 MG Tbpk tablet  Commonly known as:  MEDROL DOSEPAK  Take as directed.     omeprazole 40 MG capsule  Commonly known as:  PRILOSEC  Take 40 mg by mouth 2 (two) times daily.     oxyCODONE 10 mg 12 hr tablet  Commonly known as:  OXYCONTIN  Take 1 tablet (10 mg total) by mouth every 12 (twelve) hours.     oxyCODONE 20 MG/ML concentrated solution  Commonly known as:  ROXICODONE INTENSOL  Take 0.3 mLs (6 mg total) by mouth every 2 (two) hours as needed for severe pain.     polyethylene glycol powder powder  Commonly known as:  GLYCOLAX/MIRALAX  Take 17 g by mouth once. Dissolve 1 capful of  powder into any liquid and take once daily. Can increase to 2 times daily if no effect after 3-4 days.     PROAIR RESPICLICK 277 (90 Base) MCG/ACT Aepb  Generic drug:  Albuterol Sulfate  Inhale 2 puffs into the lungs twice daily as needed for shortness of breath or wheezing     prochlorperazine 10 MG tablet  Commonly known as:  COMPAZINE  Take 10 mg by mouth every 6 (six) hours as needed for nausea or vomiting.     senna-docusate 8.6-50 MG tablet  Commonly known as:  Senokot-S  Take 1 tablet by mouth daily.     SONAFINE EX  Apply topically.     SUMAtriptan 100 MG tablet  Commonly known as:  IMITREX  Take 100 mg by mouth as needed for migraine or headache. Reported on 04/01/2015     Vitamin D 2000 units tablet  Take 2,000 Units by mouth daily.        Allergies:  Allergies  Allergen Reactions  . Demerol Other (See Comments)    Syncope  . Mefoxin [Cefoxitin Sodium In Dextrose]     rash  . Topamax Other (See Comments)    Had trouble walking  . Avelox [Moxifloxacin Hcl In Nacl]     Hypotension.    Past Medical History, Surgical history, Social history, and Family History were reviewed and updated.  Review of Systems: All other 10 point review of systems is negative.   Physical Exam:  height is '5\' 1"'$  (1.549 m) and weight is 124 lb (56.246 kg). Her oral temperature is 98 F (36.7 C). Her blood pressure is 133/90 and her pulse is 90. Her respiration is 16.   Wt Readings from Last 3 Encounters:  08/19/15 124 lb (56.246 kg)  08/13/15 125 lb (56.7 kg)  08/12/15 125 lb (56.7 kg)    Well-developed and well-nourished white female in no obvious distress. Head and neck exam shows no ocular or oral lesions. There are no palpable cervical or supraclavicular lymph nodes. Lungs are clear on the right side. There is still some slight decrease breath sounds on the left side. Cardiac exam regular rate and rhythm with no murmurs, rubs or bruits. Abdomen is soft. She is good bowel  sounds. There is no fluid wave. There is no palpable liver or spleen tip. Back exam shows no tenderness over the spine, ribs or hips. Extremities shows no clubbing, cyanosis or edema. Skin exam shows no rashes, ecchymoses or petechia. Neurological exam shows no focal neurological deficits.  Lab Results  Component Value Date   WBC 7.3 08/19/2015   HGB 12.3 08/19/2015   HCT 37.5 08/19/2015   MCV 85 08/19/2015   PLT 424* 08/19/2015   Lab Results  Component Value Date   FERRITIN 47 10/29/2013   IRON 64 10/29/2013   TIBC 233* 10/29/2013   UIBC 169 10/29/2013   IRONPCTSAT 27 10/29/2013   Lab Results  Component Value Date   RETICCTPCT 1.4 08/13/2008   RBC 4.41 08/19/2015   RETICCTABS 61.6 08/13/2008   No results found for: KPAFRELGTCHN, LAMBDASER, KAPLAMBRATIO No results found for: IGGSERUM, IGA, IGMSERUM No results found for: Odetta Pink, SPEI   Chemistry      Component Value Date/Time   NA 137 08/19/2015 1143   NA 133* 08/12/2015 1250   NA 140 07/22/2015 1255   K 4.1 08/19/2015 1143   K 3.6 08/12/2015 1250   K 4.0 07/22/2015 1255   CL 102 08/19/2015 1143   CL 102 08/12/2015 1250   CO2 26 08/19/2015 1143   CO2 23 08/12/2015 1250   CO2 27 07/22/2015 1255   BUN 24* 08/19/2015 1143   BUN 10 08/12/2015 1250   BUN 9.8 07/22/2015 1255   CREATININE 0.7 08/19/2015 1143   CREATININE 0.46 08/12/2015 1250   CREATININE 0.6 07/22/2015 1255      Component Value Date/Time   CALCIUM 8.7 08/19/2015 1143   CALCIUM 8.2* 08/12/2015 1250   CALCIUM 9.1 07/22/2015 1255   ALKPHOS 109* 08/19/2015 1143   ALKPHOS 86 08/12/2015 1250   AST 18 08/19/2015 1143   AST 19 08/12/2015 1250   ALT 17 08/19/2015 1143   ALT 10* 08/12/2015 1250   BILITOT 0.50 08/19/2015 1143   BILITOT 0.4 08/12/2015 1250     Impression and Plan: Ms. Kama is a pleasant 65 yo white female with metastatic adenocarcinoma of the lung. She has no "Driver" mutations  that we can target.  IWe will go ahead and complete her first cycle of treatment today. She'll then come back in 2 weeks for her next cycle.  I wanted to try to give 3 cycles of treatment and then repeat her scans. I'll do this would be reasonable.  I need to make sure that she gets her Delton See today.  Her quality of life is looking better. She is having less pain. This is a "bonus" for [place].   I spent about 30 minutes with she and her husband today.   Volanda Napoleon, MD 7/12/201712:31 PM

## 2015-08-21 ENCOUNTER — Ambulatory Visit: Payer: PPO | Admitting: Family

## 2015-08-21 ENCOUNTER — Ambulatory Visit: Payer: PPO

## 2015-08-21 ENCOUNTER — Other Ambulatory Visit: Payer: PPO

## 2015-08-24 ENCOUNTER — Other Ambulatory Visit: Payer: PPO

## 2015-08-24 ENCOUNTER — Ambulatory Visit: Payer: PPO

## 2015-08-24 ENCOUNTER — Ambulatory Visit: Payer: PPO | Admitting: Hematology & Oncology

## 2015-08-26 ENCOUNTER — Other Ambulatory Visit: Payer: Self-pay

## 2015-08-27 ENCOUNTER — Other Ambulatory Visit (HOSPITAL_BASED_OUTPATIENT_CLINIC_OR_DEPARTMENT_OTHER): Payer: PPO

## 2015-08-27 ENCOUNTER — Ambulatory Visit (HOSPITAL_BASED_OUTPATIENT_CLINIC_OR_DEPARTMENT_OTHER): Payer: PPO | Admitting: Family

## 2015-08-27 ENCOUNTER — Ambulatory Visit (HOSPITAL_BASED_OUTPATIENT_CLINIC_OR_DEPARTMENT_OTHER): Payer: PPO

## 2015-08-27 ENCOUNTER — Ambulatory Visit: Payer: PPO

## 2015-08-27 ENCOUNTER — Other Ambulatory Visit: Payer: PPO

## 2015-08-27 ENCOUNTER — Ambulatory Visit: Payer: PPO | Admitting: Family

## 2015-08-27 ENCOUNTER — Encounter: Payer: Self-pay | Admitting: Family

## 2015-08-27 VITALS — BP 147/87 | HR 82 | Temp 97.7°F | Resp 18 | Ht 61.0 in | Wt 128.0 lb

## 2015-08-27 DIAGNOSIS — C3490 Malignant neoplasm of unspecified part of unspecified bronchus or lung: Secondary | ICD-10-CM | POA: Diagnosis not present

## 2015-08-27 DIAGNOSIS — C3491 Malignant neoplasm of unspecified part of right bronchus or lung: Secondary | ICD-10-CM | POA: Diagnosis not present

## 2015-08-27 DIAGNOSIS — C7951 Secondary malignant neoplasm of bone: Secondary | ICD-10-CM

## 2015-08-27 DIAGNOSIS — Z452 Encounter for adjustment and management of vascular access device: Secondary | ICD-10-CM | POA: Diagnosis not present

## 2015-08-27 DIAGNOSIS — C7800 Secondary malignant neoplasm of unspecified lung: Secondary | ICD-10-CM | POA: Diagnosis not present

## 2015-08-27 DIAGNOSIS — J0111 Acute recurrent frontal sinusitis: Secondary | ICD-10-CM

## 2015-08-27 LAB — CBC WITH DIFFERENTIAL (CANCER CENTER ONLY)
BASO#: 0.1 10*3/uL (ref 0.0–0.2)
BASO%: 1.4 % (ref 0.0–2.0)
EOS%: 2.2 % (ref 0.0–7.0)
Eosinophils Absolute: 0.1 10*3/uL (ref 0.0–0.5)
HEMATOCRIT: 35.6 % (ref 34.8–46.6)
HGB: 11.5 g/dL — ABNORMAL LOW (ref 11.6–15.9)
LYMPH#: 1.6 10*3/uL (ref 0.9–3.3)
LYMPH%: 29 % (ref 14.0–48.0)
MCH: 27.7 pg (ref 26.0–34.0)
MCHC: 32.3 g/dL (ref 32.0–36.0)
MCV: 86 fL (ref 81–101)
MONO#: 0.5 10*3/uL (ref 0.1–0.9)
MONO%: 8.9 % (ref 0.0–13.0)
NEUT#: 3.2 10*3/uL (ref 1.5–6.5)
NEUT%: 58.5 % (ref 39.6–80.0)
PLATELETS: 245 10*3/uL (ref 145–400)
RBC: 4.15 10*6/uL (ref 3.70–5.32)
RDW: 17.9 % — AB (ref 11.1–15.7)
WBC: 5.5 10*3/uL (ref 3.9–10.0)

## 2015-08-27 LAB — CMP (CANCER CENTER ONLY)
ALK PHOS: 89 U/L — AB (ref 26–84)
ALT: 15 U/L (ref 10–47)
AST: 20 U/L (ref 11–38)
Albumin: 3.1 g/dL — ABNORMAL LOW (ref 3.3–5.5)
BILIRUBIN TOTAL: 0.5 mg/dL (ref 0.20–1.60)
BUN: 12 mg/dL (ref 7–22)
CALCIUM: 8.3 mg/dL (ref 8.0–10.3)
CO2: 26 meq/L (ref 18–33)
Chloride: 105 mEq/L (ref 98–108)
Creat: 0.6 mg/dl (ref 0.6–1.2)
GLUCOSE: 88 mg/dL (ref 73–118)
Potassium: 3.9 mEq/L (ref 3.3–4.7)
Sodium: 133 mEq/L (ref 128–145)
Total Protein: 6.4 g/dL (ref 6.4–8.1)

## 2015-08-27 LAB — LACTATE DEHYDROGENASE: LDH: 203 U/L (ref 125–245)

## 2015-08-27 MED ORDER — AMOXICILLIN-POT CLAVULANATE 875-125 MG PO TABS: 1.0000 | ORAL_TABLET | Freq: Two times a day (BID) | ORAL | Status: DC

## 2015-08-27 MED ORDER — HEPARIN SOD (PORK) LOCK FLUSH 100 UNIT/ML IV SOLN
500.0000 [IU] | Freq: Once | INTRAVENOUS | Status: AC
  Administered 2015-08-27: 500 [IU] via INTRAVENOUS
  Filled 2015-08-27: qty 5

## 2015-08-27 MED ORDER — SODIUM CHLORIDE 0.9% FLUSH
10.0000 mL | INTRAVENOUS | Status: DC | PRN
  Administered 2015-08-27: 10 mL via INTRAVENOUS
  Filled 2015-08-27: qty 10

## 2015-08-27 MED FILL — AMOX-CLAV 875-125 MG TABLET: 875-125 | 7 days supply | Qty: 14 | Fill #0

## 2015-08-27 NOTE — Patient Instructions (Signed)

## 2015-08-27 NOTE — Progress Notes (Signed)
Hematology and Oncology Follow Up Visit  [name] [id number] August 30, 1950 65 y.o. 08/27/2015   Principle Diagnosis:  Metastatic adenocarcinoma the lung - bone metastases, lymph node metastasis and intrapulmonary metastasis Malignant left pleural effusion  Current Therapy:   AbraxaneAvastin - s/p cycle 2 Navelbine/Avastin q 2wk dosing - s/p cycle 4 - progression Tecentriq s/p cycle 4 Xgeva 120 mg subcutaneous every month Palliative radiation therapy to the neck.    Interim History:  [name] is here today for a follow-up. She has done well so far tolerating treatment with Abraxane and Avastin. She still has a sinus infection despite completing a z-pack and prednisone.  She is still hoarse and feels like she has a "lump" in her throat since completing radiation. She denies difficulty swallowing. Her dry cough has improved. She has a follow-up with Dr. Sondra Come next week.  She had some questions about her number of treatments and if we could consolidate her appointments. I spoke with Dr. Marin Olp and he is ok with changing her to an every 2 week treatment schedule.  No fever, chills, n/v, rash, dizziness, SOB, chest pain, palpitations or changes in bowel or bladder habits.  No swelling, tenderness, numbness or tingling. No c/o joint aches or "bone" pain at this time.  She has had a good appetite and is staying well hydrated. Her weight is stable.   Medications:    Medication List       This list is accurate as of: 08/27/15 10:17 AM.  Always use your most recent med list.               amoxicillin-clavulanate 875-125 MG tablet  Commonly known as:  AUGMENTIN  Take 1 tablet by mouth 2 (two) times daily.     aspirin 81 MG tablet  Take 81 mg by mouth daily.     budesonide-formoterol 160-4.5 MCG/ACT inhaler  Commonly known as:  SYMBICORT  Inhale 2 puffs into the lungs 2 (two) times daily.     calcium-vitamin D 250-125 MG-UNIT tablet  Commonly known as:  OSCAL  Take 1 tablet  by mouth daily.     Echinacea 400 MG Caps  Take 400 mg by mouth daily.     fish oil-omega-3 fatty acids 1000 MG capsule  Take 2 g by mouth daily.     folic acid 1 MG tablet  Commonly known as:  FOLVITE  Take 1 mg by mouth daily.     gabapentin 300 MG capsule  Commonly known as:  NEURONTIN  Take 1 capsule (300 mg total) by mouth 3 (three) times daily.     HYDROcodone-homatropine 5-1.5 MG/5ML syrup  Commonly known as:  HYCODAN  Take 5 mLs by mouth every 6 (six) hours as needed for cough.     KLOR-CON M20 20 MEQ tablet  Generic drug:  potassium chloride SA  TAKE 1 TABLET BY MOUTH DAILY     lactulose 10 GM/15ML solution  Commonly known as:  CHRONULAC  Take 10 g by mouth 3 (three) times daily. Reported on 07/22/2015     lidocaine 2 % solution  Commonly known as:  XYLOCAINE  Use as directed 15 mLs in the mouth or throat as needed for mouth pain.     lidocaine-prilocaine cream  Commonly known as:  EMLA  Apply topically.     methylPREDNISolone 4 MG Tbpk tablet  Commonly known as:  MEDROL DOSEPAK  Take as directed.     omeprazole 40 MG capsule  Commonly known as:  PRILOSEC  Take 40 mg by mouth 2 (two) times daily.     oxyCODONE 10 mg 12 hr tablet  Commonly known as:  OXYCONTIN  Take 1 tablet (10 mg total) by mouth every 12 (twelve) hours.     oxyCODONE 20 MG/ML concentrated solution  Commonly known as:  ROXICODONE INTENSOL  Take 0.3 mLs (6 mg total) by mouth every 2 (two) hours as needed for severe pain.     polyethylene glycol powder powder  Commonly known as:  GLYCOLAX/MIRALAX  Take 17 g by mouth once. Dissolve 1 capful of powder into any liquid and take once daily. Can increase to 2 times daily if no effect after 3-4 days.     PROAIR RESPICLICK 440 (90 Base) MCG/ACT Aepb  Generic drug:  Albuterol Sulfate  Inhale 2 puffs into the lungs twice daily as needed for shortness of breath or wheezing     prochlorperazine 10 MG tablet  Commonly known as:  COMPAZINE  Take  10 mg by mouth every 6 (six) hours as needed for nausea or vomiting.     senna-docusate 8.6-50 MG tablet  Commonly known as:  Senokot-S  Take 1 tablet by mouth daily.     SONAFINE EX  Apply topically.     SUMAtriptan 100 MG tablet  Commonly known as:  IMITREX  Take 100 mg by mouth as needed for migraine or headache. Reported on 04/01/2015     Vitamin D 2000 units tablet  Take 2,000 Units by mouth daily.        Allergies:  Allergies  Allergen Reactions  . Demerol Other (See Comments)    Syncope  . Mefoxin [Cefoxitin Sodium In Dextrose]     rash  . Topamax Other (See Comments)    Had trouble walking  . Avelox [Moxifloxacin Hcl In Nacl]     Hypotension.    Past Medical History, Surgical history, Social history, and Family History were reviewed and updated.  Review of Systems: All other 10 point review of systems is negative.   Physical Exam:  height is '5\' 1"'$  (1.549 m) and weight is 128 lb (58.06 kg). Her oral temperature is 97.7 F (36.5 C). Her blood pressure is 147/87 and her pulse is 82. Her respiration is 18.   Wt Readings from Last 3 Encounters:  08/27/15 128 lb (58.06 kg)  08/19/15 124 lb (56.246 kg)  08/13/15 125 lb (56.7 kg)    Ocular: Sclerae unicteric, pupils equal, round and reactive to light Ear-nose-throat: Oropharynx clear, dentition fair Lymphatic: No cervical supraclavicular or axillary adenopathy Lungs no rales or rhonchi, good excursion bilaterally Heart regular rate and rhythm, no murmur appreciated Abd soft, some mild tenderness in her right upper quadrant, positive bowel sounds, no liver or spleen tip palpated on exam, no fluid wave MSK no focal spinal tenderness, no joint edema Neuro: non-focal, well-oriented, appropriate affect Breasts: Deferred   Lab Results  Component Value Date   WBC 5.5 08/27/2015   HGB 11.5* 08/27/2015   HCT 35.6 08/27/2015   MCV 86 08/27/2015   PLT 245 08/27/2015   Lab Results  Component Value Date   FERRITIN  47 10/29/2013   IRON 64 10/29/2013   TIBC 233* 10/29/2013   UIBC 169 10/29/2013   IRONPCTSAT 27 10/29/2013   Lab Results  Component Value Date   RETICCTPCT 1.4 08/13/2008   RBC 4.15 08/27/2015   RETICCTABS 61.6 08/13/2008   No results found for: KPAFRELGTCHN, LAMBDASER, KAPLAMBRATIO No results found for: IGGSERUM, IGA, IGMSERUM  No results found for: Odetta Pink, SPEI   Chemistry      Component Value Date/Time   NA 133 08/27/2015 0833   NA 133* 08/12/2015 1250   NA 140 07/22/2015 1255   K 3.9 08/27/2015 0833   K 3.6 08/12/2015 1250   K 4.0 07/22/2015 1255   CL 105 08/27/2015 0833   CL 102 08/12/2015 1250   CO2 26 08/27/2015 0833   CO2 23 08/12/2015 1250   CO2 27 07/22/2015 1255   BUN 12 08/27/2015 0833   BUN 10 08/12/2015 1250   BUN 9.8 07/22/2015 1255   CREATININE 0.6 08/27/2015 0833   CREATININE 0.46 08/12/2015 1250   CREATININE 0.6 07/22/2015 1255      Component Value Date/Time   CALCIUM 8.3 08/27/2015 0833   CALCIUM 8.2* 08/12/2015 1250   CALCIUM 9.1 07/22/2015 1255   ALKPHOS 89* 08/27/2015 0833   ALKPHOS 86 08/12/2015 1250   AST 20 08/27/2015 0833   AST 19 08/12/2015 1250   ALT 15 08/27/2015 0833   ALT 10* 08/12/2015 1250   BILITOT 0.50 08/27/2015 0833   BILITOT 0.4 08/12/2015 1250     Impression and Plan: [name] is a very pleasant 65 yo white female with metastatic adenocarcinoma of the lung with no "driver" mutations that we can target. So far, she has tolerated treatment with Avastin and Abraxane nicely. We will hold treatment today and resume on 7/26 so as to get her on an every 2 week treatment schedule. She will also get Xgeva that day.   She will get a new calendar today. She follows up with Dr. Earney Hamburg next week as well.  We will plan to repeat her PET scan after her next cycle (early August right before cycle 4).  A prescription for Augmentin was sent to the pharmacy downstairs.  She will  contact our office with any questions or concerns. We can certainly see her sooner if need be.   Eliezer Bottom, NP 7/20/201710:17 AM

## 2015-08-28 LAB — PREALBUMIN: PREALBUMIN: 24 mg/dL (ref 10–36)

## 2015-08-31 ENCOUNTER — Telehealth: Payer: Self-pay | Admitting: *Deleted

## 2015-08-31 NOTE — Telephone Encounter (Signed)
PAtient called stating that she has been having nosebleeds and Dr. Johnette Abraham was aware.  PAtient states they are continuing and Dr. Johnette Abraham wanted to be notified if they dont stop.  Dr. Marin Olp notified and stated he wants patient to go to an ENT doctor.

## 2015-09-02 ENCOUNTER — Other Ambulatory Visit (HOSPITAL_BASED_OUTPATIENT_CLINIC_OR_DEPARTMENT_OTHER): Payer: PPO

## 2015-09-02 ENCOUNTER — Other Ambulatory Visit: Payer: PPO

## 2015-09-02 ENCOUNTER — Ambulatory Visit: Payer: PPO | Admitting: Family

## 2015-09-02 ENCOUNTER — Ambulatory Visit: Payer: PPO

## 2015-09-02 ENCOUNTER — Ambulatory Visit (HOSPITAL_BASED_OUTPATIENT_CLINIC_OR_DEPARTMENT_OTHER): Payer: PPO

## 2015-09-02 ENCOUNTER — Ambulatory Visit (HOSPITAL_BASED_OUTPATIENT_CLINIC_OR_DEPARTMENT_OTHER): Payer: PPO | Admitting: Family

## 2015-09-02 ENCOUNTER — Encounter: Payer: Self-pay | Admitting: Family

## 2015-09-02 VITALS — Ht 61.0 in | Wt 127.0 lb

## 2015-09-02 VITALS — BP 125/78 | HR 87 | Temp 97.9°F | Resp 18

## 2015-09-02 DIAGNOSIS — C7951 Secondary malignant neoplasm of bone: Secondary | ICD-10-CM

## 2015-09-02 DIAGNOSIS — J0111 Acute recurrent frontal sinusitis: Secondary | ICD-10-CM

## 2015-09-02 DIAGNOSIS — C342 Malignant neoplasm of middle lobe, bronchus or lung: Secondary | ICD-10-CM

## 2015-09-02 DIAGNOSIS — C3490 Malignant neoplasm of unspecified part of unspecified bronchus or lung: Secondary | ICD-10-CM

## 2015-09-02 DIAGNOSIS — J910 Malignant pleural effusion: Secondary | ICD-10-CM

## 2015-09-02 DIAGNOSIS — Z5112 Encounter for antineoplastic immunotherapy: Secondary | ICD-10-CM

## 2015-09-02 DIAGNOSIS — C779 Secondary and unspecified malignant neoplasm of lymph node, unspecified: Secondary | ICD-10-CM

## 2015-09-02 DIAGNOSIS — Z5111 Encounter for antineoplastic chemotherapy: Secondary | ICD-10-CM

## 2015-09-02 DIAGNOSIS — C3491 Malignant neoplasm of unspecified part of right bronchus or lung: Secondary | ICD-10-CM

## 2015-09-02 DIAGNOSIS — C3492 Malignant neoplasm of unspecified part of left bronchus or lung: Secondary | ICD-10-CM

## 2015-09-02 DIAGNOSIS — C3480 Malignant neoplasm of overlapping sites of unspecified bronchus and lung: Secondary | ICD-10-CM

## 2015-09-02 DIAGNOSIS — C7800 Secondary malignant neoplasm of unspecified lung: Secondary | ICD-10-CM

## 2015-09-02 LAB — CMP (CANCER CENTER ONLY)
ALBUMIN: 3.2 g/dL — AB (ref 3.3–5.5)
ALT: 19 U/L (ref 10–47)
AST: 20 U/L (ref 11–38)
Alkaline Phosphatase: 88 U/L — ABNORMAL HIGH (ref 26–84)
BILIRUBIN TOTAL: 0.5 mg/dL (ref 0.20–1.60)
BUN, Bld: 10 mg/dL (ref 7–22)
CALCIUM: 7.9 mg/dL — AB (ref 8.0–10.3)
CO2: 24 mEq/L (ref 18–33)
CREATININE: 0.5 mg/dL — AB (ref 0.6–1.2)
Chloride: 108 mEq/L (ref 98–108)
Glucose, Bld: 95 mg/dL (ref 73–118)
POTASSIUM: 3.7 meq/L (ref 3.3–4.7)
Sodium: 139 mEq/L (ref 128–145)
TOTAL PROTEIN: 6.7 g/dL (ref 6.4–8.1)

## 2015-09-02 LAB — CBC WITH DIFFERENTIAL (CANCER CENTER ONLY)
BASO#: 0.1 10*3/uL (ref 0.0–0.2)
BASO%: 2.2 % — AB (ref 0.0–2.0)
EOS%: 3.5 % (ref 0.0–7.0)
Eosinophils Absolute: 0.2 10*3/uL (ref 0.0–0.5)
HEMATOCRIT: 35.2 % (ref 34.8–46.6)
HGB: 11.4 g/dL — ABNORMAL LOW (ref 11.6–15.9)
LYMPH#: 1.5 10*3/uL (ref 0.9–3.3)
LYMPH%: 32.6 % (ref 14.0–48.0)
MCH: 28 pg (ref 26.0–34.0)
MCHC: 32.4 g/dL (ref 32.0–36.0)
MCV: 87 fL (ref 81–101)
MONO#: 0.5 10*3/uL (ref 0.1–0.9)
MONO%: 11.2 % (ref 0.0–13.0)
NEUT#: 2.3 10*3/uL (ref 1.5–6.5)
NEUT%: 50.5 % (ref 39.6–80.0)
Platelets: 227 10*3/uL (ref 145–400)
RBC: 4.07 10*6/uL (ref 3.70–5.32)
RDW: 18.4 % — ABNORMAL HIGH (ref 11.1–15.7)
WBC: 4.5 10*3/uL (ref 3.9–10.0)

## 2015-09-02 LAB — LACTATE DEHYDROGENASE: LDH: 193 U/L (ref 125–245)

## 2015-09-02 MED ORDER — SODIUM CHLORIDE 0.9 % IV SOLN
Freq: Once | INTRAVENOUS | Status: AC
  Administered 2015-09-02: 10:00:00 via INTRAVENOUS

## 2015-09-02 MED ORDER — PROCHLORPERAZINE MALEATE 10 MG PO TABS
10.0000 mg | ORAL_TABLET | Freq: Once | ORAL | Status: AC
Start: 2015-09-02 — End: 2015-09-02
  Administered 2015-09-02: 10 mg via ORAL

## 2015-09-02 MED ORDER — SODIUM CHLORIDE 0.9 % IV SOLN
10.0000 mg/kg | Freq: Once | INTRAVENOUS | Status: AC
  Administered 2015-09-02: 575 mg via INTRAVENOUS
  Filled 2015-09-02: qty 16

## 2015-09-02 MED ORDER — SODIUM CHLORIDE 0.9% FLUSH
10.0000 mL | INTRAVENOUS | Status: DC | PRN
  Administered 2015-09-02: 10 mL
  Filled 2015-09-02: qty 10

## 2015-09-02 MED ORDER — DENOSUMAB 120 MG/1.7ML ~~LOC~~ SOLN
120.0000 mg | Freq: Once | SUBCUTANEOUS | Status: AC
Start: 2015-09-02 — End: 2015-09-02
  Administered 2015-09-02: 120 mg via SUBCUTANEOUS
  Filled 2015-09-02: qty 1.7

## 2015-09-02 MED ORDER — PACLITAXEL PROTEIN-BOUND CHEMO INJECTION 100 MG
81.0000 mg/m2 | Freq: Once | INTRAVENOUS | Status: AC
  Administered 2015-09-02: 125 mg via INTRAVENOUS
  Filled 2015-09-02: qty 25

## 2015-09-02 MED ORDER — PROCHLORPERAZINE MALEATE 10 MG PO TABS
ORAL_TABLET | ORAL | Status: AC
  Filled 2015-09-02: qty 1

## 2015-09-02 MED ORDER — HEPARIN SOD (PORK) LOCK FLUSH 100 UNIT/ML IV SOLN
500.0000 [IU] | Freq: Once | INTRAVENOUS | Status: AC | PRN
  Administered 2015-09-02: 500 [IU]
  Filled 2015-09-02: qty 5

## 2015-09-02 NOTE — Patient Instructions (Signed)
[facility] Discharge Instructions for Patients Receiving Chemotherapy  Today you received the following chemotherapy agents Abraxane and Avastin and Xgeva.   To help prevent nausea and vomiting after your treatment, we encourage you to take your nausea medication    If you develop nausea and vomiting that is not controlled by your nausea medication, call the clinic.   BELOW ARE SYMPTOMS THAT SHOULD BE REPORTED IMMEDIATELY:  *FEVER GREATER THAN 100.5 F  *CHILLS WITH OR WITHOUT FEVER  NAUSEA AND VOMITING THAT IS NOT CONTROLLED WITH YOUR NAUSEA MEDICATION  *UNUSUAL SHORTNESS OF BREATH  *UNUSUAL BRUISING OR BLEEDING  TENDERNESS IN MOUTH AND THROAT WITH OR WITHOUT PRESENCE OF ULCERS  *URINARY PROBLEMS  *BOWEL PROBLEMS  UNUSUAL RASH Items with * indicate a potential emergency and should be followed up as soon as possible.  Feel free to call the clinic you have any questions or concerns. The clinic phone number is [phone].  Please show the [facility] at check-in to the Emergency Department and triage nurse.

## 2015-09-02 NOTE — Progress Notes (Signed)
Hematology and Oncology Follow Up Visit  [name] [id number] Jul 12, 1950 65 y.o. 09/02/2015   Principle Diagnosis:  Metastatic adenocarcinoma the lung - bone metastases, lymph node metastasis and intrapulmonary metastasis Malignant left pleural effusion  Current Therapy:   AbraxaneAvastin - s/p cycle 2 Navelbine/Avastin q 2wk dosing - s/p cycle 4 - progression Tecentriq s/p cycle 4 Xgeva 120 mg subcutaneous every month Palliative radiation therapy to the neck.    Interim History:  [name] is here today with her sister for a follow-up. She is doing well and has no complaints at this time.  Her voice is a little better. She still has some hoarseness if she talks a good bit. She sees Dr. Sondra Come tomorrow.  She has had some sinus issues and and blood on her tissue when she blows her nose. She plans to try Vaseline and see if this moisturizes and helps prevent bleeding. She want to hold off on an ENT consult for now.  No fever, chills, n/v, cough, rash, dizziness, SOB, chest pain, palpitations or changes in bowel or bladder habits.  No swelling, numbness or tingling. She has occasional tendonitis pain in her left hand and forearm that comes and goes.  She has had a good appetite and is staying well hydrated. Her weight is stable.   Medications:    Medication List       Accurate as of 09/02/15  9:30 AM. Always use your most recent med list.          amoxicillin-clavulanate 875-125 MG tablet Commonly known as:  AUGMENTIN Take 1 tablet by mouth 2 (two) times daily.   aspirin 81 MG tablet Take 81 mg by mouth daily.   budesonide-formoterol 160-4.5 MCG/ACT inhaler Commonly known as:  SYMBICORT Inhale 2 puffs into the lungs 2 (two) times daily.   calcium-vitamin D 250-125 MG-UNIT tablet Commonly known as:  OSCAL Take 1 tablet by mouth daily.   Echinacea 400 MG Caps Take 400 mg by mouth daily.   fish oil-omega-3 fatty acids 1000 MG capsule Take 2 g by mouth daily.     folic acid 1 MG tablet Commonly known as:  FOLVITE Take 1 mg by mouth daily.   gabapentin 300 MG capsule Commonly known as:  NEURONTIN Take 1 capsule (300 mg total) by mouth 3 (three) times daily.   KLOR-CON M20 20 MEQ tablet Generic drug:  potassium chloride SA TAKE 1 TABLET BY MOUTH DAILY   lactulose 10 GM/15ML solution Commonly known as:  CHRONULAC Take 10 g by mouth 3 (three) times daily. Reported on 07/22/2015   lidocaine-prilocaine cream Commonly known as:  EMLA Apply topically.   omeprazole 40 MG capsule Commonly known as:  PRILOSEC Take 40 mg by mouth 2 (two) times daily.   oxyCODONE 10 mg 12 hr tablet Commonly known as:  OXYCONTIN Take 1 tablet (10 mg total) by mouth every 12 (twelve) hours.   oxyCODONE 20 MG/ML concentrated solution Commonly known as:  ROXICODONE INTENSOL Take 0.3 mLs (6 mg total) by mouth every 2 (two) hours as needed for severe pain.   polyethylene glycol powder powder Commonly known as:  GLYCOLAX/MIRALAX Take 17 g by mouth once. Dissolve 1 capful of powder into any liquid and take once daily. Can increase to 2 times daily if no effect after 3-4 days.   PROAIR RESPICLICK 505 (90 Base) MCG/ACT Aepb Generic drug:  Albuterol Sulfate Inhale 2 puffs into the lungs twice daily as needed for shortness of breath or wheezing  prochlorperazine 10 MG tablet Commonly known as:  COMPAZINE Take 10 mg by mouth every 6 (six) hours as needed for nausea or vomiting.   senna-docusate 8.6-50 MG tablet Commonly known as:  Senokot-S Take 1 tablet by mouth daily.   SONAFINE EX Apply topically.   SUMAtriptan 100 MG tablet Commonly known as:  IMITREX Take 100 mg by mouth as needed for migraine or headache. Reported on 04/01/2015   Vitamin D 2000 units tablet Take 2,000 Units by mouth daily.       Allergies:  Allergies  Allergen Reactions  . Demerol Other (See Comments)    Syncope  . Mefoxin [Cefoxitin Sodium In Dextrose]     rash  . Topamax  Other (See Comments)    Had trouble walking  . Avelox [Moxifloxacin Hcl In Nacl]     Hypotension.    Past Medical History, Surgical history, Social history, and Family History were reviewed and updated.  Review of Systems: All other 10 point review of systems is negative.   Physical Exam:  height is '5\' 1"'$  (1.549 m) and weight is 127 lb (57.6 kg).   Wt Readings from Last 3 Encounters:  09/02/15 127 lb (57.6 kg)  08/27/15 128 lb (58.1 kg)  08/19/15 124 lb (56.2 kg)    Ocular: Sclerae unicteric, pupils equal, round and reactive to light Ear-nose-throat: Oropharynx clear, dentition fair Lymphatic: No cervical supraclavicular or axillary adenopathy Lungs no rales or rhonchi, good excursion bilaterally Heart regular rate and rhythm, no murmur appreciated Abd soft, some mild tenderness in her right upper quadrant, positive bowel sounds, no liver or spleen tip palpated on exam, no fluid wave MSK no focal spinal tenderness, no joint edema Neuro: non-focal, well-oriented, appropriate affect Breasts: Deferred   Lab Results  Component Value Date   WBC 4.5 09/02/2015   HGB 11.4 (L) 09/02/2015   HCT 35.2 09/02/2015   MCV 87 09/02/2015   PLT 227 09/02/2015   Lab Results  Component Value Date   FERRITIN 47 10/29/2013   IRON 64 10/29/2013   TIBC 233 (L) 10/29/2013   UIBC 169 10/29/2013   IRONPCTSAT 27 10/29/2013   Lab Results  Component Value Date   RETICCTPCT 1.4 08/13/2008   RBC 4.07 09/02/2015   RETICCTABS 61.6 08/13/2008   No results found for: KPAFRELGTCHN, LAMBDASER, KAPLAMBRATIO No results found for: IGGSERUM, IGA, IGMSERUM No results found for: Odetta Pink, SPEI   Chemistry      Component Value Date/Time   NA 133 08/27/2015 0833   NA 140 07/22/2015 1255   K 3.9 08/27/2015 0833   K 4.0 07/22/2015 1255   CL 105 08/27/2015 0833   CO2 26 08/27/2015 0833   CO2 27 07/22/2015 1255   BUN 12 08/27/2015 0833   BUN  9.8 07/22/2015 1255   CREATININE 0.6 08/27/2015 0833   CREATININE 0.6 07/22/2015 1255      Component Value Date/Time   CALCIUM 8.3 08/27/2015 0833   CALCIUM 9.1 07/22/2015 1255   ALKPHOS 89 (H) 08/27/2015 0833   AST 20 08/27/2015 0833   ALT 15 08/27/2015 0833   BILITOT 0.50 08/27/2015 0833     Impression and Plan: Ms. Gagner is a very pleasant 65 yo white female with metastatic adenocarcinoma of the lung with no "driver" mutations that we can target. She is really doing quite well and has no complaints at this time.  CBC and CMP today looks good. We will proceed with cycle 3 as planned  per Dr. Marin Olp.  She will also receive Xgeva.  She will have her repeat PET scan on August 7th.  She has her current treatment and appointment schedule.  She will contact our office with any questions or concerns. We can certainly see her sooner if need be.   Eliezer Bottom, NP 7/26/20179:30 AM

## 2015-09-03 ENCOUNTER — Ambulatory Visit: Payer: PPO

## 2015-09-03 ENCOUNTER — Encounter: Payer: Self-pay | Admitting: Radiation Oncology

## 2015-09-03 ENCOUNTER — Ambulatory Visit
Admission: RE | Admit: 2015-09-03 | Discharge: 2015-09-03 | Disposition: A | Discharge status: Home / Self Care | Payer: PPO | Source: Ambulatory Visit | Attending: Radiation Oncology | Admitting: Radiation Oncology

## 2015-09-03 DIAGNOSIS — C779 Secondary and unspecified malignant neoplasm of lymph node, unspecified: Secondary | ICD-10-CM | POA: Diagnosis not present

## 2015-09-03 DIAGNOSIS — Y842 Radiological procedure and radiotherapy as the cause of abnormal reaction of the patient, or of later complication, without mention of misadventure at the time of the procedure: Secondary | ICD-10-CM | POA: Insufficient documentation

## 2015-09-03 DIAGNOSIS — M542 Cervicalgia: Secondary | ICD-10-CM | POA: Insufficient documentation

## 2015-09-03 DIAGNOSIS — C7800 Secondary malignant neoplasm of unspecified lung: Secondary | ICD-10-CM | POA: Insufficient documentation

## 2015-09-03 DIAGNOSIS — C7951 Secondary malignant neoplasm of bone: Secondary | ICD-10-CM | POA: Diagnosis not present

## 2015-09-03 DIAGNOSIS — C3490 Malignant neoplasm of unspecified part of unspecified bronchus or lung: Secondary | ICD-10-CM | POA: Insufficient documentation

## 2015-09-03 DIAGNOSIS — R040 Epistaxis: Secondary | ICD-10-CM | POA: Insufficient documentation

## 2015-09-03 DIAGNOSIS — Z7982 Long term (current) use of aspirin: Secondary | ICD-10-CM | POA: Diagnosis not present

## 2015-09-03 LAB — PREALBUMIN: PREALBUMIN: 19 mg/dL (ref 10–36)

## 2015-09-03 NOTE — Progress Notes (Signed)
Radiation Oncology         [phone] ________________________________  Name: [name] MRN: [id number]  Date: 09/03/2015  DOB: 12-25-1950  Follow-Up Visit Note  CC: Myrlene Broker, MD  Volanda Napoleon, MD    Diagnosis: Metastatic adenocarcinoma the lung - bone metastases, lymph node metastasis and intrapulmonary metastasis Malignant left pleural effusion  Interval Since Last Radiation:  07/16/2015, One month two weeks.   Narrative:  The patient returns today for routine follow-up.  She reports having tightness/pain in her neck and has trouble turning her head to the left.  She is taking oxycodone 5 mg as needed.  She reports her voice is sifter since radiation.  She reports she has trouble swallowing and pills get stuck in her throat.  She reports she is not coughing like she used to and says it sounds different.  She has been having nose bleeds that started 2 weeks ago.  She had a sinus infection and that is when it started.  She reports having a good appetite.  She said her energy level is fair.  She had an infusion yesterday. Patient says that sometimes she has to eat and drink slowly.  Patient reports left sided neck pain.  Patient also reports a painful tingling sensation in her left hand radiating up to her elbow and described the pain similar to tendonitis.'                            ALLERGIES:  is allergic to demerol; mefoxin [cefoxitin sodium in dextrose]; topamax; and avelox [moxifloxacin hcl in nacl].  Meds: Current Outpatient Prescriptions  Medication Sig Dispense Refill  . budesonide-formoterol (SYMBICORT) 160-4.5 MCG/ACT inhaler Inhale 2 puffs into the lungs 2 (two) times daily.    . folic acid (FOLVITE) 1 MG tablet Take 1 mg by mouth daily.    Marland Kitchen gabapentin (NEURONTIN) 300 MG capsule Take 1 capsule (300 mg total) by mouth 3 (three) times daily. 30 capsule 1  . lactulose (CHRONULAC) 10 GM/15ML solution Take 10 g by mouth 3 (three) times daily. Reported on  07/22/2015    . lidocaine-prilocaine (EMLA) cream Apply topically.    Marland Kitchen oxyCODONE (OXYCONTIN) 10 mg 12 hr tablet Take 1 tablet (10 mg total) by mouth every 12 (twelve) hours. 60 tablet 0  . OxyCODONE HCl, Abuse Deter, (OXAYDO) 5 MG TABA Take 1 tablet by mouth every 4 (four) hours as needed.    . polyethylene glycol powder (GLYCOLAX/MIRALAX) powder Take 17 g by mouth once. Dissolve 1 capful of powder into any liquid and take once daily. Can increase to 2 times daily if no effect after 3-4 days. (Patient taking differently: Take 17 g by mouth daily. Dissolve 1 capful of powder into any liquid and take once daily. Can increase to 2 times daily if no effect after 3-4 days.) 500 g 0  . PROAIR RESPICLICK 505 (90 BASE) MCG/ACT AEPB Inhale 2 puffs into the lungs twice daily as needed for shortness of breath or wheezing  0  . prochlorperazine (COMPAZINE) 10 MG tablet Take 10 mg by mouth every 6 (six) hours as needed for nausea or vomiting.     . senna-docusate (SENOKOT-S) 8.6-50 MG tablet Take 1 tablet by mouth daily. 30 tablet 0  . SUMAtriptan (IMITREX) 100 MG tablet Take 100 mg by mouth as needed for migraine or headache. Reported on 04/01/2015    . amoxicillin-clavulanate (AUGMENTIN) 875-125 MG tablet Take 1 tablet by  mouth 2 (two) times daily. (Patient not taking: Reported on 09/03/2015) 14 tablet 0  . aspirin 81 MG tablet Take 81 mg by mouth daily.      . calcium-vitamin D (OSCAL) 250-125 MG-UNIT per tablet Take 1 tablet by mouth daily.      . Cholecalciferol (VITAMIN D) 2000 UNITS tablet Take 2,000 Units by mouth daily.    . Echinacea 400 MG CAPS Take 400 mg by mouth daily.    . fish oil-omega-3 fatty acids 1000 MG capsule Take 2 g by mouth daily.      Marland Kitchen KLOR-CON M20 20 MEQ tablet TAKE 1 TABLET BY MOUTH DAILY (Patient not taking: Reported on 09/03/2015) 30 tablet 3  . omeprazole (PRILOSEC) 40 MG capsule Take 40 mg by mouth 2 (two) times daily.   3  . oxyCODONE (ROXICODONE INTENSOL) 20 MG/ML concentrated  solution Take 0.3 mLs (6 mg total) by mouth every 2 (two) hours as needed for severe pain. (Patient not taking: Reported on 09/03/2015) 30 mL 0  . Wound Dressings (SONAFINE EX) Apply topically.     No current facility-administered medications for this encounter.    Facility-Administered Medications Ordered in Other Encounters  Medication Dose Route Frequency Provider Last Rate Last Dose  . morphine 4 MG/ML injection 2 mg  2 mg Intravenous Q2H PRN Volanda Napoleon, MD   2 mg at 07/22/15 1418  . sodium chloride 0.9 % injection 10 mL  10 mL Intravenous PRN Golden Pop, FNP   10 mL at 12/06/13 1149    Physical Findings: The patient is in no acute distress. Patient is alert and oriented.  height is '5\' 1"'$  (1.549 m) and weight is 128 lb (58.1 kg). Her oral temperature is 98.5 F (36.9 C). Her blood pressure is 134/95 (abnormal) and her pulse is 95. Her oxygen saturation is 96%. .  No significant changes. Lungs are clear to auscultation bilaterally. Heart has regular rate and rhythm. No palpable cervical, supraclavicular, or axillary adenopathy. Abdomen soft, non-tender, normal bowel sounds. The skin is healing well on the neck.  Oral cavity is moist without secondary infection, although a raspy voice was noted.   Lab Findings: Lab Results  Component Value Date   WBC 4.5 09/02/2015   HGB 11.4 (L) 09/02/2015   HCT 35.2 09/02/2015   MCV 87 09/02/2015   PLT 227 09/02/2015    Radiographic Findings: Dg Chest 2 View  Result Date: 08/12/2015 CLINICAL DATA:  Fever since yesterday. History of lung cancer. The patient started a new chemotherapy regimen 08/07/2015. EXAM: CHEST  2 VIEW COMPARISON:  PA and lateral chest 04/22/2015. Single view of the chest 04/04/2015. PET CT scan 07/20/2015. FINDINGS: Port-A-Cath remains in place. Right lower lobe airspace opacity is unchanged. Airspace disease in the left lower lobe is also stable in appearance. No new airspace opacity is identified. The lungs are  emphysematous. Heart size is normal. IMPRESSION: No acute disease. No change in the appearance of multifocal lung carcinoma as seen on prior examinations. Electronically Signed   By: Inge Rise M.D.   On: 08/12/2015 12:55    Impression:  The patient is recovering from the effects of radiation.  She has some hoarseness which may be possibly related to her radiation treatments. This possibly could be related to recurrent laryngeal involvement and with upcoming PET scan will further evaluate this issue. Overall the patient's pain in the neck and shoulder area is much improved per discussion with the patient and husband  Plan:  Call or return to clinic prn if these symptoms worsen or fail to improve as anticipated. Dr. Marin Olp has also scheduled a follow up PET scan for the patient for August 4th. I will release this patient to the care of Dr. Marin Olp and will follow up if necessary.    ____________________________________  -----------------------------------  Blair Promise, PhD, MD

## 2015-09-03 NOTE — Progress Notes (Signed)
[name] here for follow up.  She reports having tightness/pain in her neck and has trouble turning her head to the left.  She is taking oxycodone 5 mg as needed.  She reports her voice is sifter since radiation.  She reports she has trouble swallowing and pills get stuck in her throat.  She reports she is not coughing like she used to and says it sounds different.  She has been having nose bleeds that started 2 weeks ago.  She had a sinus infection and that is when it started.  She reports having a good appetite.  She said her energy level is fair.  She had an infusion yesterday.  BP (!) 134/95 (BP Location: Left Arm, Patient Position: Sitting)   Pulse 95   Temp 98.5 F (36.9 C) (Oral)   Ht '5\' 1"'$  (1.549 m)   Wt 128 lb (58.1 kg)   SpO2 96%   BMI 24.19 kg/m    Wt Readings from Last 3 Encounters:  09/03/15 128 lb (58.1 kg)  09/02/15 127 lb (57.6 kg)  08/27/15 128 lb (58.1 kg)

## 2015-09-04 ENCOUNTER — Ambulatory Visit: Payer: PPO | Admitting: Hematology & Oncology

## 2015-09-04 ENCOUNTER — Ambulatory Visit: Payer: PPO

## 2015-09-04 ENCOUNTER — Other Ambulatory Visit: Payer: PPO

## 2015-09-09 ENCOUNTER — Ambulatory Visit: Payer: PPO

## 2015-09-11 ENCOUNTER — Telehealth: Payer: Self-pay | Admitting: Family

## 2015-09-11 ENCOUNTER — Ambulatory Visit (HOSPITAL_COMMUNITY)
Admission: RE | Admit: 2015-09-11 | Discharge: 2015-09-11 | Disposition: A | Discharge status: Home / Self Care | Payer: PPO | Source: Ambulatory Visit | Attending: Family | Admitting: Family

## 2015-09-11 DIAGNOSIS — C3491 Malignant neoplasm of unspecified part of right bronchus or lung: Secondary | ICD-10-CM | POA: Diagnosis not present

## 2015-09-11 DIAGNOSIS — R590 Localized enlarged lymph nodes: Secondary | ICD-10-CM | POA: Insufficient documentation

## 2015-09-11 DIAGNOSIS — C3490 Malignant neoplasm of unspecified part of unspecified bronchus or lung: Secondary | ICD-10-CM | POA: Diagnosis not present

## 2015-09-11 DIAGNOSIS — C7951 Secondary malignant neoplasm of bone: Secondary | ICD-10-CM | POA: Diagnosis not present

## 2015-09-11 DIAGNOSIS — J0111 Acute recurrent frontal sinusitis: Secondary | ICD-10-CM

## 2015-09-11 LAB — GLUCOSE, CAPILLARY: Glucose-Capillary: 82 mg/dL (ref 65–99)

## 2015-09-11 MED ORDER — FLUDEOXYGLUCOSE F - 18 (FDG) INJECTION
6.1700 | Freq: Once | INTRAVENOUS | Status: AC | PRN
  Administered 2015-09-11: 6.17 via INTRAVENOUS

## 2015-09-11 NOTE — Telephone Encounter (Signed)
Spoke with [name] and went over her PET scan results. All questions were answered and we will plan to see her back next week for follow-up and treatment.

## 2015-09-15 ENCOUNTER — Other Ambulatory Visit: Payer: Self-pay | Admitting: Family

## 2015-09-16 ENCOUNTER — Ambulatory Visit (HOSPITAL_BASED_OUTPATIENT_CLINIC_OR_DEPARTMENT_OTHER): Payer: PPO | Admitting: Hematology & Oncology

## 2015-09-16 ENCOUNTER — Encounter: Payer: Self-pay | Admitting: Hematology & Oncology

## 2015-09-16 ENCOUNTER — Other Ambulatory Visit (HOSPITAL_BASED_OUTPATIENT_CLINIC_OR_DEPARTMENT_OTHER): Payer: PPO

## 2015-09-16 ENCOUNTER — Ambulatory Visit (HOSPITAL_BASED_OUTPATIENT_CLINIC_OR_DEPARTMENT_OTHER): Payer: PPO

## 2015-09-16 ENCOUNTER — Other Ambulatory Visit: Payer: PPO

## 2015-09-16 ENCOUNTER — Ambulatory Visit: Payer: PPO | Admitting: Hematology & Oncology

## 2015-09-16 DIAGNOSIS — C3491 Malignant neoplasm of unspecified part of right bronchus or lung: Secondary | ICD-10-CM

## 2015-09-16 DIAGNOSIS — C3480 Malignant neoplasm of overlapping sites of unspecified bronchus and lung: Secondary | ICD-10-CM

## 2015-09-16 DIAGNOSIS — C779 Secondary and unspecified malignant neoplasm of lymph node, unspecified: Secondary | ICD-10-CM

## 2015-09-16 DIAGNOSIS — C3492 Malignant neoplasm of unspecified part of left bronchus or lung: Secondary | ICD-10-CM

## 2015-09-16 DIAGNOSIS — C7951 Secondary malignant neoplasm of bone: Secondary | ICD-10-CM

## 2015-09-16 DIAGNOSIS — C3490 Malignant neoplasm of unspecified part of unspecified bronchus or lung: Secondary | ICD-10-CM

## 2015-09-16 DIAGNOSIS — Z5111 Encounter for antineoplastic chemotherapy: Secondary | ICD-10-CM | POA: Diagnosis not present

## 2015-09-16 DIAGNOSIS — J910 Malignant pleural effusion: Secondary | ICD-10-CM

## 2015-09-16 DIAGNOSIS — C7800 Secondary malignant neoplasm of unspecified lung: Secondary | ICD-10-CM

## 2015-09-16 DIAGNOSIS — Z5112 Encounter for antineoplastic immunotherapy: Secondary | ICD-10-CM | POA: Diagnosis not present

## 2015-09-16 LAB — CBC WITH DIFFERENTIAL (CANCER CENTER ONLY)
BASO#: 0.1 10*3/uL (ref 0.0–0.2)
BASO%: 1.9 % (ref 0.0–2.0)
EOS ABS: 0.1 10*3/uL (ref 0.0–0.5)
EOS%: 1.9 % (ref 0.0–7.0)
HCT: 35.8 % (ref 34.8–46.6)
HEMOGLOBIN: 11.6 g/dL (ref 11.6–15.9)
LYMPH#: 1.6 10*3/uL (ref 0.9–3.3)
LYMPH%: 30.9 % (ref 14.0–48.0)
MCH: 27.7 pg (ref 26.0–34.0)
MCHC: 32.4 g/dL (ref 32.0–36.0)
MCV: 85 fL (ref 81–101)
MONO#: 0.7 10*3/uL (ref 0.1–0.9)
MONO%: 13.9 % — ABNORMAL HIGH (ref 0.0–13.0)
NEUT%: 51.4 % (ref 39.6–80.0)
NEUTROS ABS: 2.7 10*3/uL (ref 1.5–6.5)
Platelets: 271 10*3/uL (ref 145–400)
RBC: 4.19 10*6/uL (ref 3.70–5.32)
RDW: 18.3 % — ABNORMAL HIGH (ref 11.1–15.7)
WBC: 5.3 10*3/uL (ref 3.9–10.0)

## 2015-09-16 LAB — UA PROTEIN, DIPSTICK - CHCC SATELLITE: PROTEIN, URINE: NEGATIVE mg/dL

## 2015-09-16 LAB — CMP (CANCER CENTER ONLY)
ALBUMIN: 3.4 g/dL (ref 3.3–5.5)
ALK PHOS: 95 U/L — AB (ref 26–84)
ALT: 12 U/L (ref 10–47)
AST: 21 U/L (ref 11–38)
BUN, Bld: 14 mg/dL (ref 7–22)
CHLORIDE: 106 meq/L (ref 98–108)
CO2: 26 mEq/L (ref 18–33)
CREATININE: 0.7 mg/dL (ref 0.6–1.2)
Calcium: 8.6 mg/dL (ref 8.0–10.3)
Glucose, Bld: 116 mg/dL (ref 73–118)
POTASSIUM: 3.8 meq/L (ref 3.3–4.7)
SODIUM: 136 meq/L (ref 128–145)
TOTAL PROTEIN: 7.1 g/dL (ref 6.4–8.1)
Total Bilirubin: 0.6 mg/dl (ref 0.20–1.60)

## 2015-09-16 LAB — LACTATE DEHYDROGENASE: LDH: 234 U/L (ref 125–245)

## 2015-09-16 MED ORDER — HEPARIN SOD (PORK) LOCK FLUSH 100 UNIT/ML IV SOLN
500.0000 [IU] | Freq: Once | INTRAVENOUS | Status: AC | PRN
  Administered 2015-09-16: 500 [IU]
  Filled 2015-09-16: qty 5

## 2015-09-16 MED ORDER — SODIUM CHLORIDE 0.9 % IV SOLN
Freq: Once | INTRAVENOUS | Status: AC
  Administered 2015-09-16: 13:00:00 via INTRAVENOUS

## 2015-09-16 MED ORDER — SODIUM CHLORIDE 0.9 % IV SOLN
10.0000 mg/kg | Freq: Once | INTRAVENOUS | Status: AC
  Administered 2015-09-16: 575 mg via INTRAVENOUS
  Filled 2015-09-16: qty 16

## 2015-09-16 MED ORDER — PACLITAXEL PROTEIN-BOUND CHEMO INJECTION 100 MG
81.0000 mg/m2 | Freq: Once | INTRAVENOUS | Status: AC
  Administered 2015-09-16: 125 mg via INTRAVENOUS
  Filled 2015-09-16: qty 25

## 2015-09-16 MED ORDER — HEPARIN SOD (PORK) LOCK FLUSH 100 UNIT/ML IV SOLN
500.0000 [IU] | Freq: Once | INTRAVENOUS | Status: DC | PRN
  Filled 2015-09-16: qty 5

## 2015-09-16 MED ORDER — SODIUM CHLORIDE 0.9% FLUSH
10.0000 mL | INTRAVENOUS | Status: DC | PRN
  Filled 2015-09-16: qty 10

## 2015-09-16 MED ORDER — SODIUM CHLORIDE 0.9% FLUSH
10.0000 mL | INTRAVENOUS | Status: DC | PRN
  Administered 2015-09-16: 10 mL
  Filled 2015-09-16: qty 10

## 2015-09-16 MED ORDER — PROCHLORPERAZINE MALEATE 10 MG PO TABS
10.0000 mg | ORAL_TABLET | Freq: Once | ORAL | Status: AC
  Administered 2015-09-16: 10 mg via ORAL

## 2015-09-16 MED ORDER — PROCHLORPERAZINE MALEATE 10 MG PO TABS
ORAL_TABLET | ORAL | Status: AC
  Filled 2015-09-16: qty 1

## 2015-09-16 NOTE — Patient Instructions (Signed)
Nanoparticle Albumin-Bound Paclitaxel injection What is this medicine? NANOPARTICLE ALBUMIN-BOUND PACLITAXEL (Na no PAHR ti kuhl al BYOO muhn-bound PAK li TAX el) is a chemotherapy drug. It targets fast dividing cells, like cancer cells, and causes these cells to die. This medicine is used to treat advanced breast cancer and advanced lung cancer. This medicine may be used for other purposes; ask your health care provider or pharmacist if you have questions. What should I tell my health care provider before I take this medicine? They need to know if you have any of these conditions: -kidney disease -liver disease -low blood counts, like low platelets, red blood cells, or white blood cells -recent or ongoing radiation therapy -an unusual or allergic reaction to paclitaxel, albumin, other chemotherapy, other medicines, foods, dyes, or preservatives -pregnant or trying to get pregnant -breast-feeding How should I use this medicine? This drug is given as an infusion into a vein. It is administered in a hospital or clinic by a specially trained health care professional. Talk to your pediatrician regarding the use of this medicine in children. Special care may be needed. Overdosage: If you think you have taken too much of this medicine contact a poison control center or emergency room at once. NOTE: This medicine is only for you. Do not share this medicine with others. What if I miss a dose? It is important not to miss your dose. Call your doctor or health care professional if you are unable to keep an appointment. What may interact with this medicine? -cyclosporine -diazepam -ketoconazole -medicines to increase blood counts like filgrastim, pegfilgrastim, sargramostim -other chemotherapy drugs like cisplatin, doxorubicin, epirubicin, etoposide, teniposide, vincristine -quinidine -testosterone -vaccines -verapamil Talk to your doctor or health care professional before taking any of these  medicines: -acetaminophen -aspirin -ibuprofen -ketoprofen -naproxen This list may not describe all possible interactions. Give your health care provider a list of all the medicines, herbs, non-prescription drugs, or dietary supplements you use. Also tell them if you smoke, drink alcohol, or use illegal drugs. Some items may interact with your medicine. What should I watch for while using this medicine? Your condition will be monitored carefully while you are receiving this medicine. You will need important blood work done while you are taking this medicine. This drug may make you feel generally unwell. This is not uncommon, as chemotherapy can affect healthy cells as well as cancer cells. Report any side effects. Continue your course of treatment even though you feel ill unless your doctor tells you to stop. In some cases, you may be given additional medicines to help with side effects. Follow all directions for their use. Call your doctor or health care professional for advice if you get a fever, chills or sore throat, or other symptoms of a cold or flu. Do not treat yourself. This drug decreases your body's ability to fight infections. Try to avoid being around people who are sick. This medicine may increase your risk to bruise or bleed. Call your doctor or health care professional if you notice any unusual bleeding. Be careful brushing and flossing your teeth or using a toothpick because you may get an infection or bleed more easily. If you have any dental work done, tell your dentist you are receiving this medicine. Avoid taking products that contain aspirin, acetaminophen, ibuprofen, naproxen, or ketoprofen unless instructed by your doctor. These medicines may hide a fever. Do not become pregnant while taking this medicine. Women should inform their doctor if they wish to become pregnant or  think they might be pregnant. There is a potential for serious side effects to an unborn child. Talk to  your health care professional or pharmacist for more information. Do not breast-feed an infant while taking this medicine. Men are advised not to father a child while receiving this medicine. What side effects may I notice from receiving this medicine? Side effects that you should report to your doctor or health care professional as soon as possible: -allergic reactions like skin rash, itching or hives, swelling of the face, lips, or tongue -low blood counts - This drug may decrease the number of white blood cells, red blood cells and platelets. You may be at increased risk for infections and bleeding. -signs of infection - fever or chills, cough, sore throat, pain or difficulty passing urine -signs of decreased platelets or bleeding - bruising, pinpoint red spots on the skin, black, tarry stools, nosebleeds -signs of decreased red blood cells - unusually weak or tired, fainting spells, lightheadedness -breathing problems -changes in vision -chest pain -high or low blood pressure -mouth sores -nausea and vomiting -pain, swelling, redness or irritation at the injection site -pain, tingling, numbness in the hands or feet -slow or irregular heartbeat -swelling of the ankle, feet, hands Side effects that usually do not require medical attention (report to your doctor or health care professional if they continue or are bothersome): -aches, pains -changes in the color of fingernails -diarrhea -hair loss -loss of appetite This list may not describe all possible side effects. Call your doctor for medical advice about side effects. You may report side effects to FDA at 1-800-FDA-1088. Where should I keep my medicine? This drug is given in a hospital or clinic and will not be stored at home. NOTE: This sheet is a summary. It may not cover all possible information. If you have questions about this medicine, talk to your doctor, pharmacist, or health care provider.    2016, Elsevier/Gold Standard.  (2012-03-19 16:48:50)   Bevacizumab injection What is this medicine? BEVACIZUMAB (be va SIZ yoo mab) is a monoclonal antibody. It is used to treat cervical cancer, colorectal cancer, glioblastoma multiforme, non-small cell lung cancer (NSCLC), ovarian cancer, and renal cell cancer. This medicine may be used for other purposes; ask your health care provider or pharmacist if you have questions. What should I tell my health care provider before I take this medicine? They need to know if you have any of these conditions: -blood clots -heart disease, including heart failure, heart attack, or chest pain (angina) -high blood pressure -infection (especially a virus infection such as chickenpox, cold sores, or herpes) -kidney disease -lung disease -prior chemotherapy with doxorubicin, daunorubicin, epirubicin, or other anthracycline type chemotherapy agents -recent or ongoing radiation therapy -recent surgery -stroke -an unusual or allergic reaction to bevacizumab, hamster proteins, mouse proteins, other medicines, foods, dyes, or preservatives -pregnant or trying to get pregnant -breast-feeding How should I use this medicine? This medicine is for infusion into a vein. It is given by a health care professional in a hospital or clinic setting. Talk to your pediatrician regarding the use of this medicine in children. Special care may be needed. Overdosage: If you think you have taken too much of this medicine contact a poison control center or emergency room at once. NOTE: This medicine is only for you. Do not share this medicine with others. What if I miss a dose? It is important not to miss your dose. Call your doctor or health care professional if you are  unable to keep an appointment. What may interact with this medicine? Interactions are not expected. This list may not describe all possible interactions. Give your health care provider a list of all the medicines, herbs, non-prescription  drugs, or dietary supplements you use. Also tell them if you smoke, drink alcohol, or use illegal drugs. Some items may interact with your medicine. What should I watch for while using this medicine? Your condition will be monitored carefully while you are receiving this medicine. You will need important blood work and urine testing done while you are taking this medicine. During your treatment, let your health care professional know if you have any unusual symptoms, such as difficulty breathing. This medicine may rarely cause 'gastrointestinal perforation' (holes in the stomach, intestines or colon), a serious side effect requiring surgery to repair. This medicine should be started at least 28 days following major surgery and the site of the surgery should be totally healed. Check with your doctor before scheduling dental work or surgery while you are receiving this treatment. Talk to your doctor if you have recently had surgery or if you have a wound that has not healed. Do not become pregnant while taking this medicine or for 6 months after stopping it. Women should inform their doctor if they wish to become pregnant or think they might be pregnant. There is a potential for serious side effects to an unborn child. Talk to your health care professional or pharmacist for more information. Do not breast-feed an infant while taking this medicine. This medicine has caused ovarian failure in some women. This medicine may interfere with the ability to have a child. You should talk to your doctor or health care professional if you are concerned about your fertility. What side effects may I notice from receiving this medicine? Side effects that you should report to your doctor or health care professional as soon as possible: -allergic reactions like skin rash, itching or hives, swelling of the face, lips, or tongue -signs of infection - fever or chills, cough, sore throat, pain or trouble passing urine -signs  of decreased platelets or bleeding - bruising, pinpoint red spots on the skin, black, tarry stools, nosebleeds, blood in the urine -breathing problems -changes in vision -chest pain -confusion -jaw pain, especially after dental work -mouth sores -seizures -severe abdominal pain -severe headache -sudden numbness or weakness of the face, arm or leg -swelling of legs or ankles -symptoms of a stroke: change in mental awareness, inability to talk or move one side of the body (especially in patients with lung cancer) -trouble passing urine or change in the amount of urine -trouble speaking or understanding -trouble walking, dizziness, loss of balance or coordination Side effects that usually do not require medical attention (report to your doctor or health care professional if they continue or are bothersome): -constipation -diarrhea -dry skin -headache -loss of appetite -nausea, vomiting This list may not describe all possible side effects. Call your doctor for medical advice about side effects. You may report side effects to FDA at 1-800-FDA-1088. Where should I keep my medicine? This drug is given in a hospital or clinic and will not be stored at home. NOTE: This sheet is a summary. It may not cover all possible information. If you have questions about this medicine, talk to your doctor, pharmacist, or health care provider.    2016, Elsevier/Gold Standard. (2014-03-25 16:58:44)

## 2015-09-16 NOTE — Progress Notes (Signed)
This is a  Hematology and Oncology Follow Up Visit  [name] [id number] 10/08/1950 65 y.o. 09/16/2015   Principle Diagnosis:  Metastatic adenocarcinoma the lung - bone metastases, lymph node metastasis and intrapulmonary metastasis Malignant left pleural effusion  Current Therapy:  AbraxaneAvastin -s/p cycle #2 Navelbine/Avastin q 2wk dosing - s/p c#4 - progression  Tecentriq s/p cycle 4 Xgeva 120 mg subcutaneous every month Palliative radiation therapy to the neck.    Interim History:  [name] is here today with her husband. I think that the most important aspect of her life right now is that her granddaughter now is in custody of her daughter. This is been a really difficult time for her. Now that everything is been settled in the Court of Law, she is feeling better.  We did repeat a PET scan on her. This was done last week. Everything looked pretty stable. She has slight decrease in bone activity. There is no new disease.  She seems to be more hoarse. I think she probably has some recurrent laryngeal nerve involvement. I will see if otolaryngology can help [place] out and maybe inject her focal cord to try to help with her hoarseness.  Her appetite is doing quite well. She's gained 4 pounds.  She is on pretty good pain control right now. She does appear to be more comfortable.  She has had no problems with bleeding. She's had no problems with bowels or bladder. She's had no cough or shortness of breath.  Overall, her performance status is ECOG 1.    Medications:    Medication List       Accurate as of 09/16/15  2:21 PM. Always use your most recent med list.          aspirin 81 MG tablet Take 81 mg by mouth daily.   budesonide-formoterol 160-4.5 MCG/ACT inhaler Commonly known as:  SYMBICORT Inhale 2 puffs into the lungs 2 (two) times daily.   calcium-vitamin D 250-125 MG-UNIT tablet Commonly known as:  OSCAL Take 1 tablet by mouth daily.   Echinacea 400 MG  Caps Take 400 mg by mouth daily.   fish oil-omega-3 fatty acids 1000 MG capsule Take 2 g by mouth daily.   folic acid 1 MG tablet Commonly known as:  FOLVITE Take 1 mg by mouth daily.   gabapentin 300 MG capsule Commonly known as:  NEURONTIN Take 1 capsule (300 mg total) by mouth 3 (three) times daily.   KLOR-CON M20 20 MEQ tablet Generic drug:  potassium chloride SA TAKE 1 TABLET BY MOUTH DAILY   lactulose 10 GM/15ML solution Commonly known as:  CHRONULAC Take 10 g by mouth 3 (three) times daily. Reported on 07/22/2015   lidocaine-prilocaine cream Commonly known as:  EMLA Apply topically.   omeprazole 40 MG capsule Commonly known as:  PRILOSEC Take 40 mg by mouth 2 (two) times daily.   OxyCODONE HCl (Abuse Deter) 5 MG Taba Commonly known as:  OXAYDO Take 1 tablet by mouth every 4 (four) hours as needed.   oxyCODONE 10 mg 12 hr tablet Commonly known as:  OXYCONTIN Take 1 tablet (10 mg total) by mouth every 12 (twelve) hours.   polyethylene glycol powder powder Commonly known as:  GLYCOLAX/MIRALAX Take 17 g by mouth once. Dissolve 1 capful of powder into any liquid and take once daily. Can increase to 2 times daily if no effect after 3-4 days.   PROAIR RESPICLICK 696 (90 Base) MCG/ACT Aepb Generic drug:  Albuterol Sulfate Inhale  2 puffs into the lungs twice daily as needed for shortness of breath or wheezing   prochlorperazine 10 MG tablet Commonly known as:  COMPAZINE Take 10 mg by mouth every 6 (six) hours as needed for nausea or vomiting.   senna-docusate 8.6-50 MG tablet Commonly known as:  Senokot-S Take 1 tablet by mouth daily.   SUMAtriptan 100 MG tablet Commonly known as:  IMITREX Take 100 mg by mouth as needed for migraine or headache. Reported on 04/01/2015   Vitamin D 2000 units tablet Take 2,000 Units by mouth daily.       Allergies:  Allergies  Allergen Reactions  . Demerol Other (See Comments)    Syncope  . Mefoxin [Cefoxitin Sodium In  Dextrose]     rash  . Topamax Other (See Comments)    Had trouble walking  . Avelox [Moxifloxacin Hcl In Nacl]     Hypotension.    Past Medical History, Surgical history, Social history, and Family History were reviewed and updated.  Review of Systems: All other 10 point review of systems is negative.   Physical Exam:  oral temperature is 98.3 F (36.8 C). Her blood pressure is 113/81 and her pulse is 92. Her respiration is 20.   Wt Readings from Last 3 Encounters:  09/03/15 128 lb (58.1 kg)  09/02/15 127 lb (57.6 kg)  08/27/15 128 lb (58.1 kg)    Well-developed and well-nourished white female in no obvious distress. Head and neck exam shows no ocular or oral lesions. There are no palpable cervical or supraclavicular lymph nodes. Lungs are clear on the right side. There is still some slight decrease breath sounds on the left side. Cardiac exam regular rate and rhythm with no murmurs, rubs or bruits. Abdomen is soft. She is good bowel sounds. There is no fluid wave. There is no palpable liver or spleen tip. Back exam shows no tenderness over the spine, ribs or hips. Extremities shows no clubbing, cyanosis or edema. Skin exam shows no rashes, ecchymoses or petechia. Neurological exam shows no focal neurological deficits.   Lab Results  Component Value Date   WBC 5.3 09/16/2015   HGB 11.6 09/16/2015   HCT 35.8 09/16/2015   MCV 85 09/16/2015   PLT 271 09/16/2015   Lab Results  Component Value Date   FERRITIN 47 10/29/2013   IRON 64 10/29/2013   TIBC 233 (L) 10/29/2013   UIBC 169 10/29/2013   IRONPCTSAT 27 10/29/2013   Lab Results  Component Value Date   RETICCTPCT 1.4 08/13/2008   RBC 4.19 09/16/2015   RETICCTABS 61.6 08/13/2008   No results found for: KPAFRELGTCHN, LAMBDASER, KAPLAMBRATIO No results found for: IGGSERUM, IGA, IGMSERUM No results found for: [name], SPEI   Chemistry      Component Value  Date/Time   NA 136 09/16/2015 1152   NA 140 07/22/2015 1255   K 3.8 09/16/2015 1152   K 4.0 07/22/2015 1255   CL 106 09/16/2015 1152   CO2 26 09/16/2015 1152   CO2 27 07/22/2015 1255   BUN 14 09/16/2015 1152   BUN 9.8 07/22/2015 1255   CREATININE 0.7 09/16/2015 1152   CREATININE 0.6 07/22/2015 1255      Component Value Date/Time   CALCIUM 8.6 09/16/2015 1152   CALCIUM 9.1 07/22/2015 1255   ALKPHOS 95 (H) 09/16/2015 1152   AST 21 09/16/2015 1152   ALT 12 09/16/2015 1152   BILITOT 0.60 09/16/2015 1152     Impression  and Plan: Ms. Barbour is a pleasant 65 yo white female with metastatic adenocarcinoma of the lung. She has no "Driver" mutations that we can target.  I and happy that her PET scan showed stable disease. For her, I think this is a Doctor, hospital. Hopefully, we can maintain stability or improvement for the next few months.  I forgot to mention that she and her family [facility] recently. She really had a great time. She totally enjoyed it down there. Hopefully, they will be able to get down there again.  I'll probably plan for 3 more cycles of treatment and then repeat her scan again.  Hopefully, the horses [place] helped out by otolaryngology.  We'll plan to get her back to see [place] in another couple weeks or so.    Volanda Napoleon, MD 8/9/20172:21 PM

## 2015-09-17 ENCOUNTER — Ambulatory Visit: Payer: PPO

## 2015-09-17 ENCOUNTER — Other Ambulatory Visit: Payer: PPO

## 2015-09-17 ENCOUNTER — Ambulatory Visit: Payer: PPO | Admitting: Hematology & Oncology

## 2015-09-17 LAB — PREALBUMIN: PREALBUMIN: 18 mg/dL (ref 10–36)

## 2015-09-18 ENCOUNTER — Ambulatory Visit: Payer: PPO

## 2015-09-21 ENCOUNTER — Ambulatory Visit: Payer: PPO

## 2015-09-21 ENCOUNTER — Emergency Department (HOSPITAL_BASED_OUTPATIENT_CLINIC_OR_DEPARTMENT_OTHER): Payer: PPO

## 2015-09-21 ENCOUNTER — Telehealth: Payer: Self-pay | Admitting: Emergency Medicine

## 2015-09-21 ENCOUNTER — Encounter (HOSPITAL_BASED_OUTPATIENT_CLINIC_OR_DEPARTMENT_OTHER): Payer: Self-pay | Admitting: Emergency Medicine

## 2015-09-21 ENCOUNTER — Inpatient Hospital Stay (HOSPITAL_BASED_OUTPATIENT_CLINIC_OR_DEPARTMENT_OTHER)
Admission: EM | Admit: 2015-09-21 | Discharge: 2015-09-24 | DRG: 392 | Disposition: A | Discharge status: Home / Self Care | Payer: PPO | Attending: Internal Medicine | Admitting: Internal Medicine

## 2015-09-21 ENCOUNTER — Other Ambulatory Visit: Payer: Self-pay | Admitting: Emergency Medicine

## 2015-09-21 ENCOUNTER — Other Ambulatory Visit: Payer: Self-pay

## 2015-09-21 ENCOUNTER — Other Ambulatory Visit: Payer: PPO

## 2015-09-21 ENCOUNTER — Ambulatory Visit: Payer: PPO | Admitting: Family

## 2015-09-21 DIAGNOSIS — C3490 Malignant neoplasm of unspecified part of unspecified bronchus or lung: Secondary | ICD-10-CM | POA: Diagnosis not present

## 2015-09-21 DIAGNOSIS — A084 Viral intestinal infection, unspecified: Secondary | ICD-10-CM | POA: Diagnosis not present

## 2015-09-21 DIAGNOSIS — Z87891 Personal history of nicotine dependence: Secondary | ICD-10-CM | POA: Diagnosis not present

## 2015-09-21 DIAGNOSIS — R9431 Abnormal electrocardiogram [ECG] [EKG]: Secondary | ICD-10-CM | POA: Diagnosis present

## 2015-09-21 DIAGNOSIS — Z833 Family history of diabetes mellitus: Secondary | ICD-10-CM

## 2015-09-21 DIAGNOSIS — R509 Fever, unspecified: Secondary | ICD-10-CM | POA: Diagnosis not present

## 2015-09-21 DIAGNOSIS — D6481 Anemia due to antineoplastic chemotherapy: Secondary | ICD-10-CM | POA: Diagnosis present

## 2015-09-21 DIAGNOSIS — Z885 Allergy status to narcotic agent status: Secondary | ICD-10-CM

## 2015-09-21 DIAGNOSIS — Z7951 Long term (current) use of inhaled steroids: Secondary | ICD-10-CM

## 2015-09-21 DIAGNOSIS — C779 Secondary and unspecified malignant neoplasm of lymph node, unspecified: Secondary | ICD-10-CM | POA: Diagnosis not present

## 2015-09-21 DIAGNOSIS — I739 Peripheral vascular disease, unspecified: Secondary | ICD-10-CM | POA: Diagnosis not present

## 2015-09-21 DIAGNOSIS — Z8249 Family history of ischemic heart disease and other diseases of the circulatory system: Secondary | ICD-10-CM

## 2015-09-21 DIAGNOSIS — I2510 Atherosclerotic heart disease of native coronary artery without angina pectoris: Secondary | ICD-10-CM | POA: Diagnosis not present

## 2015-09-21 DIAGNOSIS — Z9049 Acquired absence of other specified parts of digestive tract: Secondary | ICD-10-CM | POA: Diagnosis not present

## 2015-09-21 DIAGNOSIS — C3480 Malignant neoplasm of overlapping sites of unspecified bronchus and lung: Secondary | ICD-10-CM | POA: Diagnosis present

## 2015-09-21 DIAGNOSIS — Z803 Family history of malignant neoplasm of breast: Secondary | ICD-10-CM | POA: Diagnosis not present

## 2015-09-21 DIAGNOSIS — G43909 Migraine, unspecified, not intractable, without status migrainosus: Secondary | ICD-10-CM | POA: Diagnosis not present

## 2015-09-21 DIAGNOSIS — J449 Chronic obstructive pulmonary disease, unspecified: Secondary | ICD-10-CM | POA: Diagnosis not present

## 2015-09-21 DIAGNOSIS — C7800 Secondary malignant neoplasm of unspecified lung: Secondary | ICD-10-CM | POA: Diagnosis not present

## 2015-09-21 DIAGNOSIS — Z90710 Acquired absence of both cervix and uterus: Secondary | ICD-10-CM | POA: Diagnosis not present

## 2015-09-21 DIAGNOSIS — R112 Nausea with vomiting, unspecified: Secondary | ICD-10-CM | POA: Diagnosis not present

## 2015-09-21 DIAGNOSIS — E860 Dehydration: Secondary | ICD-10-CM | POA: Diagnosis not present

## 2015-09-21 DIAGNOSIS — E889 Metabolic disorder, unspecified: Secondary | ICD-10-CM | POA: Diagnosis present

## 2015-09-21 DIAGNOSIS — C7951 Secondary malignant neoplasm of bone: Secondary | ICD-10-CM | POA: Diagnosis present

## 2015-09-21 DIAGNOSIS — Z8673 Personal history of transient ischemic attack (TIA), and cerebral infarction without residual deficits: Secondary | ICD-10-CM | POA: Diagnosis not present

## 2015-09-21 DIAGNOSIS — I700 Atherosclerosis of aorta: Secondary | ICD-10-CM | POA: Diagnosis not present

## 2015-09-21 DIAGNOSIS — T451X5A Adverse effect of antineoplastic and immunosuppressive drugs, initial encounter: Secondary | ICD-10-CM | POA: Diagnosis not present

## 2015-09-21 DIAGNOSIS — Z79899 Other long term (current) drug therapy: Secondary | ICD-10-CM

## 2015-09-21 DIAGNOSIS — K219 Gastro-esophageal reflux disease without esophagitis: Secondary | ICD-10-CM | POA: Diagnosis present

## 2015-09-21 DIAGNOSIS — I10 Essential (primary) hypertension: Secondary | ICD-10-CM | POA: Diagnosis present

## 2015-09-21 DIAGNOSIS — J910 Malignant pleural effusion: Secondary | ICD-10-CM | POA: Diagnosis present

## 2015-09-21 DIAGNOSIS — I4581 Long QT syndrome: Secondary | ICD-10-CM | POA: Diagnosis present

## 2015-09-21 DIAGNOSIS — C3432 Malignant neoplasm of lower lobe, left bronchus or lung: Secondary | ICD-10-CM | POA: Diagnosis not present

## 2015-09-21 DIAGNOSIS — K449 Diaphragmatic hernia without obstruction or gangrene: Secondary | ICD-10-CM | POA: Diagnosis not present

## 2015-09-21 DIAGNOSIS — R1115 Cyclical vomiting syndrome unrelated to migraine: Secondary | ICD-10-CM

## 2015-09-21 DIAGNOSIS — K4020 Bilateral inguinal hernia, without obstruction or gangrene, not specified as recurrent: Secondary | ICD-10-CM | POA: Diagnosis not present

## 2015-09-21 DIAGNOSIS — Z87440 Personal history of urinary (tract) infections: Secondary | ICD-10-CM

## 2015-09-21 DIAGNOSIS — R51 Headache: Secondary | ICD-10-CM | POA: Diagnosis not present

## 2015-09-21 DIAGNOSIS — Z85118 Personal history of other malignant neoplasm of bronchus and lung: Secondary | ICD-10-CM

## 2015-09-21 DIAGNOSIS — G43A0 Cyclical vomiting, not intractable: Secondary | ICD-10-CM | POA: Diagnosis not present

## 2015-09-21 DIAGNOSIS — R519 Headache, unspecified: Secondary | ICD-10-CM | POA: Diagnosis present

## 2015-09-21 DIAGNOSIS — Z888 Allergy status to other drugs, medicaments and biological substances status: Secondary | ICD-10-CM

## 2015-09-21 DIAGNOSIS — R1110 Vomiting, unspecified: Secondary | ICD-10-CM | POA: Diagnosis not present

## 2015-09-21 DIAGNOSIS — Z881 Allergy status to other antibiotic agents status: Secondary | ICD-10-CM

## 2015-09-21 DIAGNOSIS — Z7982 Long term (current) use of aspirin: Secondary | ICD-10-CM

## 2015-09-21 DIAGNOSIS — Z79891 Long term (current) use of opiate analgesic: Secondary | ICD-10-CM

## 2015-09-21 LAB — CBC WITH DIFFERENTIAL/PLATELET
BASOS ABS: 0 10*3/uL (ref 0.0–0.1)
Basophils Relative: 1 %
EOS ABS: 0 10*3/uL (ref 0.0–0.7)
EOS PCT: 0 %
HCT: 36.1 % (ref 36.0–46.0)
Hemoglobin: 11.7 g/dL — ABNORMAL LOW (ref 12.0–15.0)
LYMPHS PCT: 10 %
Lymphs Abs: 0.4 10*3/uL — ABNORMAL LOW (ref 0.7–4.0)
MCH: 27.7 pg (ref 26.0–34.0)
MCHC: 32.4 g/dL (ref 30.0–36.0)
MCV: 85.3 fL (ref 78.0–100.0)
MONO ABS: 0.1 10*3/uL (ref 0.1–1.0)
Monocytes Relative: 3 %
Neutro Abs: 3.8 10*3/uL (ref 1.7–7.7)
Neutrophils Relative %: 86 %
PLATELETS: 229 10*3/uL (ref 150–400)
RBC: 4.23 MIL/uL (ref 3.87–5.11)
RDW: 18.3 % — AB (ref 11.5–15.5)
WBC: 4.4 10*3/uL (ref 4.0–10.5)

## 2015-09-21 LAB — COMPREHENSIVE METABOLIC PANEL
ALT: 12 U/L — ABNORMAL LOW (ref 14–54)
AST: 21 U/L (ref 15–41)
Albumin: 3.6 g/dL (ref 3.5–5.0)
Alkaline Phosphatase: 93 U/L (ref 38–126)
Anion gap: 8 (ref 5–15)
BILIRUBIN TOTAL: 0.7 mg/dL (ref 0.3–1.2)
BUN: 11 mg/dL (ref 6–20)
CO2: 21 mmol/L — ABNORMAL LOW (ref 22–32)
Calcium: 7.3 mg/dL — ABNORMAL LOW (ref 8.9–10.3)
Chloride: 106 mmol/L (ref 101–111)
Creatinine, Ser: 0.39 mg/dL — ABNORMAL LOW (ref 0.44–1.00)
GFR calc Af Amer: >60 mL/min (ref >60)
Glucose, Bld: 107 mg/dL — ABNORMAL HIGH (ref 65–99)
POTASSIUM: 3.8 mmol/L (ref 3.5–5.1)
Sodium: 135 mmol/L (ref 135–145)
TOTAL PROTEIN: 7 g/dL (ref 6.5–8.1)

## 2015-09-21 LAB — LIPASE, BLOOD: Lipase: 24 U/L (ref 11–51)

## 2015-09-21 LAB — URINALYSIS, ROUTINE W REFLEX MICROSCOPIC
Glucose, UA: NEGATIVE mg/dL
HGB URINE DIPSTICK: NEGATIVE
Leukocytes, UA: NEGATIVE
Nitrite: NEGATIVE
PROTEIN: NEGATIVE mg/dL
SPECIFIC GRAVITY, URINE: 1.025 (ref 1.005–1.030)
pH: 6.5 (ref 5.0–8.0)

## 2015-09-21 LAB — I-STAT CG4 LACTIC ACID, ED: LACTIC ACID, VENOUS: 0.93 mmol/L (ref 0.5–1.9)

## 2015-09-21 MED ORDER — MORPHINE SULFATE (PF) 4 MG/ML IV SOLN
4.0000 mg | Freq: Once | INTRAVENOUS | Status: AC
  Administered 2015-09-21: 4 mg via INTRAVENOUS
  Filled 2015-09-21: qty 1

## 2015-09-21 MED ORDER — SODIUM CHLORIDE 0.9 % IV BOLUS (SEPSIS)
1000.0000 mL | Freq: Once | INTRAVENOUS | Status: AC
  Administered 2015-09-21: 1000 mL via INTRAVENOUS

## 2015-09-21 MED ORDER — PANTOPRAZOLE SODIUM 40 MG PO TBEC
40.0000 mg | DELAYED_RELEASE_TABLET | Freq: Every day | ORAL | Status: DC
  Administered 2015-09-21: 40 mg via ORAL
  Filled 2015-09-21: qty 1

## 2015-09-21 MED ORDER — ONDANSETRON HCL 4 MG/2ML IJ SOLN
4.0000 mg | Freq: Once | INTRAMUSCULAR | Status: AC
  Administered 2015-09-21: 4 mg via INTRAVENOUS
  Filled 2015-09-21: qty 2

## 2015-09-21 MED ORDER — CETYLPYRIDINIUM CHLORIDE 0.05 % MT LIQD: 7.0000 mL | Freq: Two times a day (BID) | OROMUCOSAL | Status: DC

## 2015-09-21 MED ORDER — IOPAMIDOL (ISOVUE-300) INJECTION 61%
100.0000 mL | Freq: Once | INTRAVENOUS | Status: AC | PRN
  Administered 2015-09-21: 100 mL via INTRAVENOUS

## 2015-09-21 MED ORDER — HYDROMORPHONE HCL 1 MG/ML IJ SOLN
1.0000 mg | Freq: Once | INTRAMUSCULAR | Status: AC
  Administered 2015-09-21: 1 mg via INTRAVENOUS
  Filled 2015-09-21: qty 1

## 2015-09-21 NOTE — ED Notes (Signed)
Pt reports a Tmax of 100.1 this AM.

## 2015-09-21 NOTE — H&P (Signed)
[name] YQI:[id number] DOB: Dec 17, 1950 DOA: 09/21/2015     PCP: Myrlene Broker, MD   Outpatient Specialists: oncology [facility]  Patient coming from:  home Lives  With family   Chief Complaint: Headache Nausea and vomiting  HPI: [name] is a 65 y.o. female with medical history significant of metastatic adenocarcinoma of the lung with metastases to the bone and lymph nodes and lungs with malignant left pleural effusion, HTN, CVA,  GERD  Presented with 1 day history of nausea and vomiting low grade fever up to 100.1 associated with some headache and generalized fatigue. She has had decreased by mouth intake only drink some boost. No urinary symptoms she attempted to self treat herself with Tylenol and the headache by mouth. Yesterday was observed initially went out due to a restaurant after coming back from a restaurant she began to vomit and hasn't been able to stop since then. She have had mild abdominal distention and pain and cramping. The headache has been going on for almost a month but has gotten worse today. She father had it was likely secondary to sinus infection. She's been treated for that with antibiotics and steroids. She feels lightheaded and worried she is dehydrated. This was not associated diarrhea no constipation no chest pain or shortness of breath she had stronger smell to her urine but otherwise no dysuria. she still actively receiving chemotherapy Last chemotherapy was 5 days ago  She recently had had trouble with sinus infection and nosebleeds  chronic hoarseness which is unchanged from prior infarct to be secondary to recurrent laryngeal nerve involvement  Regarding pertinent Chronic problems: Regarding history of lung cancer hears a summary of her chemotherapy treatment  AbraxaneAvastin - s/p cycle 2 Navelbine/Avastin q 2wk dosing - s/p cycle 4 - progression Tecentriq s/p cycle 4 Xgeva 120 mg subcutaneous every month Palliative radiation therapy to the  neck.  Note patient has been diagnosed with Klebsiella UTI in the beginning of July  IN ER: Afebrile heart rate up to 106 on arrival BP 146/92 currently heart rate down to 90s blood pressure 127/87  UA showing > 80 of ketones but no evidence of infection Sodium 135 bicarbonate 21 BUN 11 creatinine 0.39 WBC 4.4 hemoglobin 11.7 Lipase 24  CT head showing scattered sclerotic foci in the bony calvarium mucosal thickening of ethmoid air cells ET abdomen and pelvis no evidence of soft tissue metastatic disease otherwise nonacute stable diffuse sclerotic bone metastases Yes assay showing slight improvement  Hospitalist was called for admission for intractable nausea and vomiting presumed secondary to gastroenteritis Associated dehydration Review of Systems:    Pertinent positives include:  indigestion, abdominal pain, nausea, vomiting, headaches,  Constitutional:  No weight loss, night sweats, Fevers, chills, fatigue, weight loss  HEENT:  No  Difficulty swallowing,Tooth/dental problems,Sore throat,  No sneezing, itching, ear ache, nasal congestion, post nasal drip,  Cardio-vascular:  No chest pain, Orthopnea, PND, anasarca, dizziness, palpitations.no Bilateral lower extremity swelling  GI:  No heartburn, diarrhea, change in bowel habits, loss of appetite, melena, blood in stool, hematemesis Resp:  no shortness of breath at rest. No dyspnea on exertion, No excess mucus, no productive cough, No non-productive cough, No coughing up of blood.No change in color of mucus.No wheezing. Skin:  no rash or lesions. No jaundice GU:  no dysuria, change in color of urine, no urgency or frequency. No straining to urinate.  No flank pain.  Musculoskeletal:  No joint pain or no joint swelling. No decreased range  of motion. No back pain.  Psych:  No change in mood or affect. No depression or anxiety. No memory loss.  Neuro: no localizing neurological complaints, no tingling, no weakness, no double  vision, no gait abnormality, no slurred speech, no confusion  As per HPI otherwise 10 point review of systems negative.   Past Medical History: Past Medical History:  Diagnosis Date  . B12 deficiency   . Cancer [facility]) 2005 & 2008   Right Upper Lobe (2005) & Left Upper Lobe (2008). Subsequent Nodules treated empirically in 2010 & 2011 w/ XRT & chemo.  Marland Kitchen GERD (gastroesophageal reflux disease)   . Hypertension   . Lung cancer, primary, with metastasis from lung to other site [facility]) 04/01/2015  . Pleural effusion, malignant 04/01/2015  . Radiation 07/03/15-07/16/15   C3 spine 30 Gy  . Stroke ([place])   . Vitamin D deficiency    Past Surgical History:  Procedure Laterality Date  . APPENDECTOMY    . BRONCHOSCOPY    . CHOLECYSTECTOMY    . cyst removed from ovary  1968  . HIATAL HERNIA REPAIR  2010  . LUNG REMOVAL, PARTIAL  2005, 2008   R upper lobe and left upper lobe wedge  . MEDIASTINOSCOPY    . VAGINAL HYSTERECTOMY  1983     Social History:  Ambulatory  Independently     reports that she quit smoking about 12 years ago. Her smoking use included Cigarettes. She started smoking about 48 years ago. She has a 66.00 pack-year smoking history. She has never used smokeless tobacco. She reports that she does not drink alcohol or use drugs.  Allergies:   Allergies  Allergen Reactions  . Demerol Other (See Comments)    Syncope  . Mefoxin [Cefoxitin Sodium In Dextrose]     rash  . Topamax Other (See Comments)    Had trouble walking  . Avelox [Moxifloxacin Hcl In Nacl]     Hypotension.       Family History: Family History  Problem Relation Age of Onset  . Heart attack Father   . Hypertension Mother   . Diabetes Mother   . Congestive Heart Failure Mother   . Breast cancer Sister   . Lung disease Neg Hx     Medications: Prior to Admission medications   Medication Sig Start Date End Date Taking? Authorizing Provider  aspirin 81 MG tablet Take 81 mg by mouth daily.       Historical Provider, MD  budesonide-formoterol (SYMBICORT) 160-4.5 MCG/ACT inhaler Inhale 2 puffs into the lungs 2 (two) times daily.    Historical Provider, MD  calcium-vitamin D (OSCAL) 250-125 MG-UNIT per tablet Take 1 tablet by mouth daily.      Historical Provider, MD  Cholecalciferol (VITAMIN D) 2000 UNITS tablet Take 2,000 Units by mouth daily.    Historical Provider, MD  Echinacea 400 MG CAPS Take 400 mg by mouth daily.    Historical Provider, MD  fish oil-omega-3 fatty acids 1000 MG capsule Take 2 g by mouth daily.      Historical Provider, MD  folic acid (FOLVITE) 1 MG tablet Take 1 mg by mouth daily.    Historical Provider, MD  gabapentin (NEURONTIN) 300 MG capsule Take 1 capsule (300 mg total) by mouth 3 (three) times daily. 07/09/15   Gery Pray, MD  KLOR-CON M20 20 MEQ tablet TAKE 1 TABLET BY MOUTH DAILY 12/10/14   Volanda Napoleon, MD  lactulose [facility]) 10 GM/15ML solution Take 10 g by mouth 3 (  three) times daily. Reported on 07/22/2015    Historical Provider, MD  lidocaine-prilocaine (EMLA) cream Apply topically.    Historical Provider, MD  omeprazole (PRILOSEC) 40 MG capsule Take 40 mg by mouth 2 (two) times daily.  12/05/13   Historical Provider, MD  oxyCODONE (OXYCONTIN) 10 mg 12 hr tablet Take 1 tablet (10 mg total) by mouth every 12 (twelve) hours. 06/23/15   Volanda Napoleon, MD  OxyCODONE HCl, Abuse Deter, (OXAYDO) 5 MG TABA Take 1 tablet by mouth every 4 (four) hours as needed.    Historical Provider, MD  polyethylene glycol powder (GLYCOLAX/MIRALAX) powder Take 17 g by mouth once. Dissolve 1 capful of powder into any liquid and take once daily. Can increase to 2 times daily if no effect after 3-4 days. Patient taking differently: Take 17 g by mouth daily. Dissolve 1 capful of powder into any liquid and take once daily. Can increase to 2 times daily if no effect after 3-4 days. 06/27/15   Forde Dandy, MD  PROAIR RESPICLICK 149 [phone] BASE) MCG/ACT AEPB Inhale 2 puffs into the  lungs twice daily as needed for shortness of breath or wheezing 10/11/14   Historical Provider, MD  prochlorperazine (COMPAZINE) 10 MG tablet Take 10 mg by mouth every 6 (six) hours as needed for nausea or vomiting.  12/26/14   Historical Provider, MD  senna-docusate (SENOKOT-S) 8.6-50 MG tablet Take 1 tablet by mouth daily. 06/27/15   Forde Dandy, MD  SUMAtriptan (IMITREX) 100 MG tablet Take 100 mg by mouth as needed for migraine or headache. Reported on 04/01/2015    Historical Provider, MD    Physical Exam: Patient Vitals for the past 24 hrs:  BP Temp Temp src Pulse Resp SpO2 Height Weight  09/21/15 2245 122/89 98.3 F (36.8 C) Oral 91 17 97 % - -  09/21/15 2146 127/87 98.3 F (36.8 C) - 90 19 91 % - -  09/21/15 2030 114/82 - - 89 15 90 % - -  09/21/15 2000 123/83 - - 88 15 90 % - -  09/21/15 1930 (!) 136/107 - - 87 20 92 % - -  09/21/15 1900 125/90 98 F (36.7 C) Oral 85 14 90 % - -  09/21/15 1830 125/83 - - 86 15 91 % - -  09/21/15 1800 120/87 - - 89 15 90 % - -  09/21/15 1730 - - - 86 12 (!) 89 % - -  09/21/15 1607 121/79 - - 90 16 94 % - -  09/21/15 1530 114/83 - - 97 - 93 % - -  09/21/15 1500 109/82 - - 98 - 94 % - -  09/21/15 1441 110/83 - - 101 18 96 % - -  09/21/15 1405 113/80 99.2 F (37.3 C) Rectal 109 - 94 % - -  09/21/15 1342 - - - - - - '5\' 1"'$  (1.549 m) 58.1 kg (128 lb)  09/21/15 1333 146/92 98.6 F (37 C) Oral 106 18 96 % '5\' 1"'$  (1.549 m) 58.1 kg (128 lb)    1. General:  in No Acute distress 2. Psychological: Alert and  Oriented 3. Head/ENT:     Dry Mucous Membranes                          Head Non traumatic, neck supple  Poor Dentition 4. SKIN:  decreased Skin turgor,  Skin clean Dry and intact no rash 5. Heart: Regular rate and rhythm no  Murmur, Rub or gallop 6. Lungs:   no wheezes some crackles   7. Abdomen: Soft,   non-tender, Non distended 8. Lower extremities: no clubbing, cyanosis, or edema 9. Neurologically   strength 5 out of 5  in all 4 extremities cranial nerves II through XII intact except for left ankle has chronic weakness since CVA 10. MSK: Normal range of motion   body mass index is 24.19 kg/m.  Labs on Admission:   Labs on Admission: I have personally reviewed following labs and imaging studies  CBC:  Recent Labs Lab 09/16/15 1151 09/21/15 1420  WBC 5.3 4.4  NEUTROABS 2.7 3.8  HGB 11.6 11.7*  HCT 35.8 36.1  MCV 85 85.3  PLT 271 945   Basic Metabolic Panel:  Recent Labs Lab 09/16/15 1152 09/21/15 1420  NA 136 135  K 3.8 3.8  CL 106 106  CO2 26 21*  GLUCOSE 116 107*  BUN 14 11  CREATININE 0.7 0.39*  CALCIUM 8.6 7.3*   GFR: Estimated Creatinine Clearance: 56.7 mL/min (by C-G formula based on SCr of 0.8 mg/dL). Liver Function Tests:  Recent Labs Lab 09/16/15 1152 09/21/15 1420  AST 21 21  ALT 12 12*  ALKPHOS 95* 93  BILITOT 0.60 0.7  PROT 7.1 7.0  ALBUMIN 3.4 3.6    Recent Labs Lab 09/21/15 1420  LIPASE 24   No results for input(s): AMMONIA in the last 168 hours. Coagulation Profile: No results for input(s): INR, PROTIME in the last 168 hours. Cardiac Enzymes: No results for input(s): CKTOTAL, CKMB, CKMBINDEX, TROPONINI in the last 168 hours. BNP (last 3 results) No results for input(s): PROBNP in the last 8760 hours. HbA1C: No results for input(s): HGBA1C in the last 72 hours. CBG: No results for input(s): GLUCAP in the last 168 hours. Lipid Profile: No results for input(s): CHOL, HDL, LDLCALC, TRIG, CHOLHDL, LDLDIRECT in the last 72 hours. Thyroid Function Tests: No results for input(s): TSH, T4TOTAL, FREET4, T3FREE, THYROIDAB in the last 72 hours. Anemia Panel: No results for input(s): VITAMINB12, FOLATE, FERRITIN, TIBC, IRON, RETICCTPCT in the last 72 hours. Urine analysis:    Component Value Date/Time   COLORURINE YELLOW 09/21/2015 [place] 09/21/2015 1555   LABSPEC 1.025 09/21/2015 1555   LABSPEC 1.020 06/03/2015 1451   PHURINE 6.5  09/21/2015 1555   GLUCOSEU NEGATIVE 09/21/2015 1555   HGBUR NEGATIVE 09/21/2015 1555   BILIRUBINUR SMALL (A) 09/21/2015 1555   KETONESUR >80 (A) 09/21/2015 1555   PROTEINUR NEGATIVE 09/21/2015 1555   UROBILINOGEN 0.2 06/03/2015 1451   NITRITE NEGATIVE 09/21/2015 1555   LEUKOCYTESUR NEGATIVE 09/21/2015 1555   Sepsis Labs: '@LABRCNTIP'$ (procalcitonin:4,lacticidven:4) ) Recent Results (from the past 240 hour(s))  Culture, blood (Routine X 2)     Status: None (Preliminary result)   Collection Time: 09/21/15  2:19 PM  Result Value Ref Range Status   Specimen Description   Final    BLOOD RIGHT ANTECUBITAL Performed at [facility]    Special Requests BOTTLES DRAWN AEROBIC AND ANAEROBIC 10CC EACH  Final   Culture PENDING  Incomplete   Report Status PENDING  Incomplete     UA   no evidence of UTI    No results found for: HGBA1C  Estimated Creatinine Clearance: 56.7 mL/min (by C-G formula based on SCr of 0.8 mg/dL).  BNP (last 3 results) No results  for input(s): PROBNP in the last 8760 hours.   ECG REPORT  Independently reviewed Rate: 85  Rhythm: Sinus rhythm ST&T Change: T wave flattening in lateral leads  QTC 640  Filed Weights   09/21/15 1333 09/21/15 1342  Weight: 58.1 kg (128 lb) 58.1 kg (128 lb)     Cultures:    Component Value Date/Time   SDES  09/21/2015 1419    BLOOD RIGHT ANTECUBITAL Performed at [place] 09/21/2015 1419   CULT PENDING 09/21/2015 1419   REPTSTATUS PENDING 09/21/2015 1419     Radiological Exams on Admission: Dg Chest 2 View  Result Date: 09/21/2015 CLINICAL DATA:  Fever and nausea EXAM: CHEST  2 VIEW COMPARISON:  08/12/2015 FINDINGS: Cardiac shadow is stable. Right chest wall port is again seen and stable patchy changes consistent with multifocal neoplasm slightly improved from the prior exam. No bony abnormality is noted. No new focal infiltrate is seen. Multiple  areas of bony sclerosis are noted consistent with metastatic disease. IMPRESSION: Slight improvement in the appearance of multifocal disease. Electronically Signed   By: Inez Catalina M.D.   On: 09/21/2015 14:41   Ct Head Wo Contrast  Result Date: 09/21/2015 CLINICAL DATA:  Sinus drainage and nosebleeds. Headache with vomiting. History of lung carcinoma EXAM: CT HEAD WITHOUT CONTRAST CT MAXILLOFACIAL WITHOUT CONTRAST TECHNIQUE: Multidetector CT imaging of the head and maxillofacial structures were performed using the standard protocol without intravenous contrast. Multiplanar CT image reconstructions of the maxillofacial structures were also generated. COMPARISON:  Head CT November 24, 2014 FINDINGS: CT HEAD FINDINGS There is stable age related volume loss. The patient has had a previous parietal craniotomy with encephalomalacia in the inferomedial right parietal lobe, stable. There is no intracranial mass hemorrhage, extra-axial fluid collection, or midline shift there is small vessel disease in the posterior superior right corona radiata, stable. There is no new gray-white compartment lesion. No acute infarct is evident. As noted above, there is evidence of previous high right parietal craniotomy. Multiple sclerotic lesions are noted in the bony calvarium, concerning for sclerotic metastases given the history of lung carcinoma. Mastoid air cells are clear. There is no hyperdense vessel or appreciable arterial vascular calcification. CT MAXILLOFACIAL FINDINGS There is no fracture or dislocation. There are sclerotic bony metastases at C2, C4, and in right C1 lamina. There is also a sclerotic lesion anterior to the odontoid in the anterior arch of C1. Sclerotic bony metastases are noted in each sphenoid wing. Frontal sinuses are virtually aplastic. There is mucosal thickening in several ethmoid air cells bilaterally. Other paranasal sinuses are clear. No air-fluid level. No bony destruction or expansion.  Ostiomeatal unit complexes are patent bilaterally. Nares are patent bilaterally. There is slight leftward deviation of nasal septum. No intraorbital lesions are identified. Orbits appear symmetric bilaterally. No adenopathy is evident. The salivary glands appear symmetric bilaterally. Visualized pharynx and tongue base regions appear unremarkable. IMPRESSION: CT head: Previous craniotomy on the right superiorly with encephalomalacia in the inferomedial right parietal lobe, stable. There is small vessel disease in the high right corona radiata. No acute infarct. No mass, hemorrhage, or extra-axial fluid collection. Scattered sclerotic foci in the bony calvarium raise concern for metastatic disease given history of lung carcinoma. CT maxillofacial: Extensive sclerotic bony metastases. Mucosal thickening in several ethmoid air cells. Frontal sinus is nearly aplastic. No air-fluid level. No bony destruction or expansion. No intraorbital lesions. Ostiomeatal complexes are patent bilaterally. No intraorbital  lesions evident. Electronically Signed   By: Lowella Grip III M.D.   On: 09/21/2015 17:06   Ct Abdomen Pelvis W Contrast  Result Date: 09/21/2015 CLINICAL DATA:  Generalized abdominal pain with nausea and vomiting for 3 months. Bilateral lung carcinoma. EXAM: CT ABDOMEN AND PELVIS WITH CONTRAST TECHNIQUE: Multidetector CT imaging of the abdomen and pelvis was performed using the standard protocol following bolus administration of intravenous contrast. CONTRAST:  136m ISOVUE-300 IOPAMIDOL (ISOVUE-300) INJECTION 61% COMPARISON:  PET-CT on 09/11/2015 and AP CT on 06/27/2015 FINDINGS: Lower chest: Irregular masses in the lingula and left lower lobe appears stable. Ill-defined airspace opacity in the central right lower lobe is also unchanged. Hepatobiliary: No masses or other significant abnormality. Prior cholecystectomy again noted. Mild biliary ductal dilatation remains stable. Pancreas: No mass, inflammatory  changes, or other significant abnormality. Spleen: Within normal limits in size and appearance. Adrenals/Urinary Tract: No masses identified. No evidence of hydronephrosis. Stomach/Bowel: Small hiatal hernia again noted. No evidence of obstruction, inflammatory process, or abnormal fluid collections. Vascular/Lymphatic: No pathologically enlarged lymph nodes. No evidence of abdominal aortic aneurysm. Aortic atherosclerosis noted. Reproductive: Prior hysterectomy noted. Adnexal regions are unremarkable in appearance. Other: Small bilateral inguinal hernias containing only fat are unchanged. Musculoskeletal: Diffuse sclerotic bone metastases show no significant change in appearance. IMPRESSION: No evidence of soft tissue metastatic disease or other acute findings within the abdomen or pelvis. Stable small hiatal hernia. Stable diffuse sclerotic bone metastases. Aortic atherosclerosis noted. Stable left lower lung masses and ill-defined airspace opacity in central right lower lobe. Electronically Signed   By: JEarle GellM.D.   On: 09/21/2015 17:16   Ct Maxillofacial Wo Contrast  Result Date: 09/21/2015 CLINICAL DATA:  Sinus drainage and nosebleeds. Headache with vomiting. History of lung carcinoma EXAM: CT HEAD WITHOUT CONTRAST CT MAXILLOFACIAL WITHOUT CONTRAST TECHNIQUE: Multidetector CT imaging of the head and maxillofacial structures were performed using the standard protocol without intravenous contrast. Multiplanar CT image reconstructions of the maxillofacial structures were also generated. COMPARISON:  Head CT November 24, 2014 FINDINGS: CT HEAD FINDINGS There is stable age related volume loss. The patient has had a previous parietal craniotomy with encephalomalacia in the inferomedial right parietal lobe, stable. There is no intracranial mass hemorrhage, extra-axial fluid collection, or midline shift there is small vessel disease in the posterior superior right corona radiata, stable. There is no new  gray-white compartment lesion. No acute infarct is evident. As noted above, there is evidence of previous high right parietal craniotomy. Multiple sclerotic lesions are noted in the bony calvarium, concerning for sclerotic metastases given the history of lung carcinoma. Mastoid air cells are clear. There is no hyperdense vessel or appreciable arterial vascular calcification. CT MAXILLOFACIAL FINDINGS There is no fracture or dislocation. There are sclerotic bony metastases at C2, C4, and in right C1 lamina. There is also a sclerotic lesion anterior to the odontoid in the anterior arch of C1. Sclerotic bony metastases are noted in each sphenoid wing. Frontal sinuses are virtually aplastic. There is mucosal thickening in several ethmoid air cells bilaterally. Other paranasal sinuses are clear. No air-fluid level. No bony destruction or expansion. Ostiomeatal unit complexes are patent bilaterally. Nares are patent bilaterally. There is slight leftward deviation of nasal septum. No intraorbital lesions are identified. Orbits appear symmetric bilaterally. No adenopathy is evident. The salivary glands appear symmetric bilaterally. Visualized pharynx and tongue base regions appear unremarkable. IMPRESSION: CT head: Previous craniotomy on the right superiorly with encephalomalacia in the inferomedial right parietal lobe, stable. There  is small vessel disease in the high right corona radiata. No acute infarct. No mass, hemorrhage, or extra-axial fluid collection. Scattered sclerotic foci in the bony calvarium raise concern for metastatic disease given history of lung carcinoma. CT maxillofacial: Extensive sclerotic bony metastases. Mucosal thickening in several ethmoid air cells. Frontal sinus is nearly aplastic. No air-fluid level. No bony destruction or expansion. No intraorbital lesions. Ostiomeatal complexes are patent bilaterally. No intraorbital lesions evident. Electronically Signed   By: Lowella Grip III M.D.    On: 09/21/2015 17:06    Chart has been reviewed    Assessment/Plan   65 y.o. female with medical history significant of metastatic adenocarcinoma of the lung with metastases to the bone and lymph nodes and lungs with malignant left pleural effusion, HTN, CVA,  GERD being admitted for dehydration and headache  Present on Admission: . Nausea & vomiting currently resolving suspect secondary to gastroenteritis, CT abdomen unremarkable and CT head showing no intracranial lesions both significant for multiple bone metastases . Dehydration -  - we'll administer IV fluids and recheck orthostatics in the morning . Bone metastasis ([place]) stable continue pain medication and bowel regimen . COPD (chronic obstructive pulmonary disease) ([place]) stable continue home medication . Headache likely combination of the dehydrated but also suspect migraine type of headache. Supportive management treat with Imitrex and toradol no meningismus symptoms . Essential (primary) hypertension - stable, currently not on antihypertensives . Malignant neoplasm of overlapping sites of unspecified bronchus and lung [facility]) - oncology notified . Prolonged QT interval - - will monitor on tele avoid QT prolonging medications, rehydrate correct electrolytes    Other plan as per orders.  DVT prophylaxis:    Lovenox     Code Status:  FULL CODE   as per patient    Family Communication:   Family   at  Bedside  plan of care was discussed with   Husband Tery Sanfilippo Solberg [phone]  Disposition Plan:   To home once workup is complete and patient is stable   Consults called: emailed ennever   Admission status:  obs   Level of care     Tele       I have spent a total of 54 min on this admission    Quorra Rosene 09/22/2015, 12:10 AM    [facility]  Pager [phone]   after 2 AM please page floor coverage PA If 7AM-7PM, please contact the day team taking care of the patient  [email or web address]  Password [email or web address]

## 2015-09-21 NOTE — Plan of Care (Signed)
65 yo F with hx of Lung Ca with bone mets pt of Dr.Ennever on chemo Have had nausea vomiting for 2 days no relief. CT head bone mets but no brain mets, CT abd negative,ERspoke to Dr. Julien Nordmann.  They will see tomorrow but want to admit for dehydration K 3.8 Cr 0.39 Accepted as obs to med surge bed  [name] 7:07 PM

## 2015-09-21 NOTE — ED Provider Notes (Signed)
Physical Exam  BP 121/79 (BP Location: Right Arm)   Pulse 90   Temp 99.2 F (37.3 C) (Rectal)   Resp 16   Ht '5\' 1"'$  (1.549 m)   Wt 58.1 kg   SpO2 94%   BMI 24.19 kg/m   Physical Exam  Constitutional: She appears well-developed and well-nourished. She appears distressed (appears fatigue and uncomfortable).  HENT:  Head: Atraumatic.  Eyes: Conjunctivae are normal.  Neck: Neck supple.  Cardiovascular: Normal rate and regular rhythm.   Pulmonary/Chest: Effort normal and breath sounds normal.  Abdominal: Soft. There is tenderness (mild diffuse tenderness without guarding or rebound tenderness).  Neurological: She is alert.  Skin: No rash noted.  Psychiatric: She has a normal mood and affect.  Nursing note and vitals reviewed.   ED Course  Procedures  BP 125/90   Pulse 85   Temp 98 F (36.7 C) (Oral)   Resp 14   Ht '5\' 1"'$  (1.549 m)   Wt 58.1 kg   SpO2 90%   BMI 24.19 kg/m    5:07 PM Pt with hx of recurrently lung cancer with mets to bones here with n/v and dehydration after eating out yesterday for her birthday party.  She is currently on chemo. Does c/o sinus headache.  Currently await head CT and abd CT.  Will need to assess PO status.  Oncologist Dr. Martha Clan.   6:11 PM Head, Cervical Spine CTs without acute brain metastasis.  Evidence of bony metastasis.  Abd/pelvis CT without acute finding.  Pt still actively vomiting and not feeling well.  Will continue sxs treatment  6:32 PM Appreciate consultation from on call oncologist Dr. Earlie Server, who felt if pt needs to be admitted for sxs control of her n/v, then she can be admit to [facility] and Dr. Martha Clan can see her tomorrow morning.    7:24 PM Appreciate consultation from [facility] Dr. Roel Cluck who agrees to accept pt to med surg, observation status for further care.  Pt is aware and agrees with plan.    Results for orders placed or performed during the hospital encounter of 09/21/15  Culture,  blood (Routine X 2)  Result Value Ref Range   Specimen Description      BLOOD RIGHT ANTECUBITAL Performed at [facility]    Special Requests BOTTLES DRAWN AEROBIC AND ANAEROBIC 10CC EACH    Culture PENDING    Report Status PENDING   Comprehensive metabolic panel  Result Value Ref Range   Sodium 135 135 - 145 mmol/L   Potassium 3.8 3.5 - 5.1 mmol/L   Chloride 106 101 - 111 mmol/L   CO2 21 (L) 22 - 32 mmol/L   Glucose, Bld 107 (H) 65 - 99 mg/dL   BUN 11 6 - 20 mg/dL   Creatinine, Ser 0.39 (L) 0.44 - 1.00 mg/dL   Calcium 7.3 (L) 8.9 - 10.3 mg/dL   Total Protein 7.0 6.5 - 8.1 g/dL   Albumin 3.6 3.5 - 5.0 g/dL   AST 21 15 - 41 U/L   ALT 12 (L) 14 - 54 U/L   Alkaline Phosphatase 93 38 - 126 U/L   Total Bilirubin 0.7 0.3 - 1.2 mg/dL   GFR calc non Af Amer >60 >60 mL/min   GFR calc Af Amer >60 >60 mL/min   Anion gap 8 5 - 15  Urinalysis, Routine w reflex microscopic  Result Value Ref Range   Color, Urine YELLOW YELLOW   APPearance CLEAR CLEAR   Specific  Gravity, Urine 1.025 1.005 - 1.030   pH 6.5 5.0 - 8.0   Glucose, UA NEGATIVE NEGATIVE mg/dL   Hgb urine dipstick NEGATIVE NEGATIVE   Bilirubin Urine SMALL (A) NEGATIVE   Ketones, ur >80 (A) NEGATIVE mg/dL   Protein, ur NEGATIVE NEGATIVE mg/dL   Nitrite NEGATIVE NEGATIVE   Leukocytes, UA NEGATIVE NEGATIVE  CBC with Differential  Result Value Ref Range   WBC 4.4 4.0 - 10.5 K/uL   RBC 4.23 3.87 - 5.11 MIL/uL   Hemoglobin 11.7 (L) 12.0 - 15.0 g/dL   HCT 36.1 36.0 - 46.0 %   MCV 85.3 78.0 - 100.0 fL   MCH 27.7 26.0 - 34.0 pg   MCHC 32.4 30.0 - 36.0 g/dL   RDW 18.3 (H) 11.5 - 15.5 %   Platelets 229 150 - 400 K/uL   Neutrophils Relative % 86 %   Neutro Abs 3.8 1.7 - 7.7 K/uL   Lymphocytes Relative 10 %   Lymphs Abs 0.4 (L) 0.7 - 4.0 K/uL   Monocytes Relative 3 %   Monocytes Absolute 0.1 0.1 - 1.0 K/uL   Eosinophils Relative 0 %   Eosinophils Absolute 0.0 0.0 - 0.7 K/uL   Basophils Relative 1 %   Basophils Absolute  0.0 0.0 - 0.1 K/uL  Lipase, blood  Result Value Ref Range   Lipase 24 11 - 51 U/L  I-Stat CG4 Lactic Acid, ED  Result Value Ref Range   Lactic Acid, Venous 0.93 0.5 - 1.9 mmol/L   Dg Chest 2 View  Result Date: 09/21/2015 CLINICAL DATA:  Fever and nausea EXAM: CHEST  2 VIEW COMPARISON:  08/12/2015 FINDINGS: Cardiac shadow is stable. Right chest wall port is again seen and stable patchy changes consistent with multifocal neoplasm slightly improved from the prior exam. No bony abnormality is noted. No new focal infiltrate is seen. Multiple areas of bony sclerosis are noted consistent with metastatic disease. IMPRESSION: Slight improvement in the appearance of multifocal disease. Electronically Signed   By: Inez Catalina M.D.   On: 09/21/2015 14:41   Ct Head Wo Contrast  Result Date: 09/21/2015 CLINICAL DATA:  Sinus drainage and nosebleeds. Headache with vomiting. History of lung carcinoma EXAM: CT HEAD WITHOUT CONTRAST CT MAXILLOFACIAL WITHOUT CONTRAST TECHNIQUE: Multidetector CT imaging of the head and maxillofacial structures were performed using the standard protocol without intravenous contrast. Multiplanar CT image reconstructions of the maxillofacial structures were also generated. COMPARISON:  Head CT November 24, 2014 FINDINGS: CT HEAD FINDINGS There is stable age related volume loss. The patient has had a previous parietal craniotomy with encephalomalacia in the inferomedial right parietal lobe, stable. There is no intracranial mass hemorrhage, extra-axial fluid collection, or midline shift there is small vessel disease in the posterior superior right corona radiata, stable. There is no new gray-white compartment lesion. No acute infarct is evident. As noted above, there is evidence of previous high right parietal craniotomy. Multiple sclerotic lesions are noted in the bony calvarium, concerning for sclerotic metastases given the history of lung carcinoma. Mastoid air cells are clear. There is no  hyperdense vessel or appreciable arterial vascular calcification. CT MAXILLOFACIAL FINDINGS There is no fracture or dislocation. There are sclerotic bony metastases at C2, C4, and in right C1 lamina. There is also a sclerotic lesion anterior to the odontoid in the anterior arch of C1. Sclerotic bony metastases are noted in each sphenoid wing. Frontal sinuses are virtually aplastic. There is mucosal thickening in several ethmoid air cells bilaterally. Other paranasal sinuses  are clear. No air-fluid level. No bony destruction or expansion. Ostiomeatal unit complexes are patent bilaterally. Nares are patent bilaterally. There is slight leftward deviation of nasal septum. No intraorbital lesions are identified. Orbits appear symmetric bilaterally. No adenopathy is evident. The salivary glands appear symmetric bilaterally. Visualized pharynx and tongue base regions appear unremarkable. IMPRESSION: CT head: Previous craniotomy on the right superiorly with encephalomalacia in the inferomedial right parietal lobe, stable. There is small vessel disease in the high right corona radiata. No acute infarct. No mass, hemorrhage, or extra-axial fluid collection. Scattered sclerotic foci in the bony calvarium raise concern for metastatic disease given history of lung carcinoma. CT maxillofacial: Extensive sclerotic bony metastases. Mucosal thickening in several ethmoid air cells. Frontal sinus is nearly aplastic. No air-fluid level. No bony destruction or expansion. No intraorbital lesions. Ostiomeatal complexes are patent bilaterally. No intraorbital lesions evident. Electronically Signed   By: Lowella Grip III M.D.   On: 09/21/2015 17:06   Ct Abdomen Pelvis W Contrast  Result Date: 09/21/2015 CLINICAL DATA:  Generalized abdominal pain with nausea and vomiting for 3 months. Bilateral lung carcinoma. EXAM: CT ABDOMEN AND PELVIS WITH CONTRAST TECHNIQUE: Multidetector CT imaging of the abdomen and pelvis was performed using  the standard protocol following bolus administration of intravenous contrast. CONTRAST:  158m ISOVUE-300 IOPAMIDOL (ISOVUE-300) INJECTION 61% COMPARISON:  PET-CT on 09/11/2015 and AP CT on 06/27/2015 FINDINGS: Lower chest: Irregular masses in the lingula and left lower lobe appears stable. Ill-defined airspace opacity in the central right lower lobe is also unchanged. Hepatobiliary: No masses or other significant abnormality. Prior cholecystectomy again noted. Mild biliary ductal dilatation remains stable. Pancreas: No mass, inflammatory changes, or other significant abnormality. Spleen: Within normal limits in size and appearance. Adrenals/Urinary Tract: No masses identified. No evidence of hydronephrosis. Stomach/Bowel: Small hiatal hernia again noted. No evidence of obstruction, inflammatory process, or abnormal fluid collections. Vascular/Lymphatic: No pathologically enlarged lymph nodes. No evidence of abdominal aortic aneurysm. Aortic atherosclerosis noted. Reproductive: Prior hysterectomy noted. Adnexal regions are unremarkable in appearance. Other: Small bilateral inguinal hernias containing only fat are unchanged. Musculoskeletal: Diffuse sclerotic bone metastases show no significant change in appearance. IMPRESSION: No evidence of soft tissue metastatic disease or other acute findings within the abdomen or pelvis. Stable small hiatal hernia. Stable diffuse sclerotic bone metastases. Aortic atherosclerosis noted. Stable left lower lung masses and ill-defined airspace opacity in central right lower lobe. Electronically Signed   By: JEarle GellM.D.   On: 09/21/2015 17:16   Nm Pet Image Restag (ps) Skull Base To Thigh  Result Date: 09/11/2015 CLINICAL DATA:  Subsequent treatment strategy for lung cancer. EXAM: NUCLEAR MEDICINE PET SKULL BASE TO THIGH TECHNIQUE: 6.2 mCi F-18 FDG was injected intravenously. Full-ring PET imaging was performed from the skull base to thigh after the radiotracer. CT data was  obtained and used for attenuation correction and anatomic localization. FASTING BLOOD GLUCOSE:  Value: 82 mg/dl COMPARISON:  07/20/2015 FINDINGS: NECK Previously seen 1 cm right-sided level 2 lymph node (image 27 series 4) remains hypermetabolic with SUV max = 6.1 today compared to 5.7 previously. CHEST Multiple pulmonary nodules and masses are again seen bilaterally. Index lesion in the inferior segment of the lingula measures 3.4 x 1.9 cm today compared to 3.6 x 2.4 cm previously. SUV max = 3.7 today compared to 3.6 previously. Previously measured pleural soft tissue in the medial aspect of the left lower hemi thorax is difficult to reproducibly measure, but is about 4.2 x 4.3 cm today compared  to 4.1 x 4.0 cm previously. SUV max = 4.6 today compared to 3.8-5.3 previously. Left hilar hypermetabolism demonstrates SUV max = 9.3 today compared to 7.8 previously. Subcarinal and prevascular hypermetabolic lymph nodes persist. The index prevascular node demonstrates SUV max = 5.6 today compared to 6.9 previously and is stable at 11 mm. Numerous other pulmonary nodules are seen scattered through both lungs. Heart size is upper normal. Coronary artery calcification is evident. Right Port-A-Cath tip is positioned at the distal SVC level. ABDOMEN/PELVIS No abnormal hypermetabolic activity within the liver, pancreas, adrenal glands, or spleen. No hypermetabolic lymph nodes in the abdomen or pelvis. Small to moderate hiatal hernia. Gallbladder surgically absent. Abdominal aortic atherosclerosis without aneurysm. Bilateral groin hernias contain only fat. Uterus is surgically absent. SKELETON Widespread bony metastatic disease is again noted while much of the bony involvement appears relatively stable, there is a lesion in the anterior left iliac crest this shows interval decrease in hypermetabolism with SUV max = 5 today compared 8.3 previously. Other scattered areas of spinal involvement show decrease in hypermetabolism. The  L2 vertebral body lesion demonstrates SUV max = 6.5 today compared to 8.0 previously it appears less confluent. IMPRESSION: 1. No generalized trend of worsening or improving disease. Much of the hypermetabolic disease is relatively stable on CT and PET imaging although an index prevascular lymph node shows slight decrease in hypermetabolism and scattered bony deposits also show slight interval decrease in FDG uptake. Electronically Signed   By: Misty Stanley M.D.   On: 09/11/2015 14:04   Ct Maxillofacial Wo Contrast  Result Date: 09/21/2015 CLINICAL DATA:  Sinus drainage and nosebleeds. Headache with vomiting. History of lung carcinoma EXAM: CT HEAD WITHOUT CONTRAST CT MAXILLOFACIAL WITHOUT CONTRAST TECHNIQUE: Multidetector CT imaging of the head and maxillofacial structures were performed using the standard protocol without intravenous contrast. Multiplanar CT image reconstructions of the maxillofacial structures were also generated. COMPARISON:  Head CT November 24, 2014 FINDINGS: CT HEAD FINDINGS There is stable age related volume loss. The patient has had a previous parietal craniotomy with encephalomalacia in the inferomedial right parietal lobe, stable. There is no intracranial mass hemorrhage, extra-axial fluid collection, or midline shift there is small vessel disease in the posterior superior right corona radiata, stable. There is no new gray-white compartment lesion. No acute infarct is evident. As noted above, there is evidence of previous high right parietal craniotomy. Multiple sclerotic lesions are noted in the bony calvarium, concerning for sclerotic metastases given the history of lung carcinoma. Mastoid air cells are clear. There is no hyperdense vessel or appreciable arterial vascular calcification. CT MAXILLOFACIAL FINDINGS There is no fracture or dislocation. There are sclerotic bony metastases at C2, C4, and in right C1 lamina. There is also a sclerotic lesion anterior to the odontoid in the  anterior arch of C1. Sclerotic bony metastases are noted in each sphenoid wing. Frontal sinuses are virtually aplastic. There is mucosal thickening in several ethmoid air cells bilaterally. Other paranasal sinuses are clear. No air-fluid level. No bony destruction or expansion. Ostiomeatal unit complexes are patent bilaterally. Nares are patent bilaterally. There is slight leftward deviation of nasal septum. No intraorbital lesions are identified. Orbits appear symmetric bilaterally. No adenopathy is evident. The salivary glands appear symmetric bilaterally. Visualized pharynx and tongue base regions appear unremarkable. IMPRESSION: CT head: Previous craniotomy on the right superiorly with encephalomalacia in the inferomedial right parietal lobe, stable. There is small vessel disease in the high right corona radiata. No acute infarct. No mass, hemorrhage, or  extra-axial fluid collection. Scattered sclerotic foci in the bony calvarium raise concern for metastatic disease given history of lung carcinoma. CT maxillofacial: Extensive sclerotic bony metastases. Mucosal thickening in several ethmoid air cells. Frontal sinus is nearly aplastic. No air-fluid level. No bony destruction or expansion. No intraorbital lesions. Ostiomeatal complexes are patent bilaterally. No intraorbital lesions evident. Electronically Signed   By: Lowella Grip III M.D.   On: 09/21/2015 17:06      Domenic Moras, PA-C 09/21/15 1925    Julianne Rice, MD 09/25/15 [phone]

## 2015-09-21 NOTE — Telephone Encounter (Signed)
Patient's sister calls to report that Mrs Dezarn has been experiencing nausea and vomiting X 1 episode since yesterday. States fever of 100.1 today; complains of headache and weakness. States only intake today has been a boost shake. Denies any urinary sx other than "an odor" per patient. States that she took tylenol just now and states she took a "headache pill" earlier today. Per Dr Marin Olp; patient was instructed to come into the office today to see Judson Roch and have labs and possible IV fluids. Patient's sister verbalized understanding.

## 2015-09-21 NOTE — ED Provider Notes (Signed)
[place] DEPT [facility] Provider Note   CSN: [id number] Arrival date & time: 09/21/15  1332     History   Chief Complaint Chief Complaint  Patient presents with  . Emesis    HPI [name] is a 65 y.o. female.  HPI [name] is a 65 y.o. female with history of recurrent lung cancer, hypertension, CVA, presents to emergency department complaining of a headache, nausea, vomiting. Patient is currently undergoing chemotherapy for recurrent lung cancer. She states yesterday was her birthday and she went out to eat at a restaurant. She states that her back from restaurant she began vomiting. States unable to stop throwing up since then. She reports mild abdominal discomfort. She is also complaining of gradually worsening headache onset approximately a month ago that worsened today. She states she was being treated for sinus infection. States she has had antibiotics and steroids. She states she believes due to vomiting her headache has worsened today. She states she feels dehydrated and dizzy. Denies any chest pain or shortness of breath. She states she has had a fever up to 100.1 at home. No treatment prior to coming in. Denies any diarrhea or constipation. No back pain. No urinary symptoms. Denies neck pain or stiffness. Last chemo 5 days ago.  Past Medical History:  Diagnosis Date  . B12 deficiency   . Cancer [facility]) 2005 & 2008   Right Upper Lobe (2005) & Left Upper Lobe (2008). Subsequent Nodules treated empirically in 2010 & 2011 w/ XRT & chemo.  Marland Kitchen GERD (gastroesophageal reflux disease)   . Hypertension   . Lung cancer, primary, with metastasis from lung to other site [facility]) 04/01/2015  . Pleural effusion, malignant 04/01/2015  . Radiation 07/03/15-07/16/15   C3 spine 30 Gy  . Stroke ([place])   . Vitamin D deficiency     Patient Active Problem List   Diagnosis Date Noted  . Drug-induced constipation 07/03/2015  . Lung cancer, primary, with metastasis from lung to other site [facility])  04/01/2015  . Pleural effusion, malignant 04/01/2015  . Bone metastasis ([place]) 04/01/2015  . Chest wall pain 11/27/2014  . Sepsis due to Escherichia coli ([place]) 11/26/2014  . Anxiety 11/24/2014  . CN (constipation) 11/24/2014  . Clinical depression 11/24/2014  . Essential (primary) hypertension 11/24/2014  . Cerebrovascular accident, old 11/24/2014  . Malignant neoplasm of overlapping sites of unspecified bronchus and lung ([place]) 11/24/2014  . Headache, migraine 11/24/2014  . N&V (nausea and vomiting) 11/24/2014  . Pleural cavity effusion 11/24/2014  . Syncope and collapse 11/24/2014  . S/P thoracentesis   . COPD (chronic obstructive pulmonary disease) ([place]) 10/02/2014  . Restrictive lung disease 10/02/2014  . GERD (gastroesophageal reflux disease) 10/02/2014  . Hiatal hernia 10/02/2014  . Dyspnea 09/02/2014  . Cancer of lung ([place]) 05/02/2011    Past Surgical History:  Procedure Laterality Date  . APPENDECTOMY    . BRONCHOSCOPY    . CHOLECYSTECTOMY    . cyst removed from ovary  1968  . HIATAL HERNIA REPAIR  2010  . LUNG REMOVAL, PARTIAL  2005, 2008   R upper lobe and left upper lobe wedge  . MEDIASTINOSCOPY    . VAGINAL HYSTERECTOMY  1983    OB History    No data available       Home Medications    Prior to Admission medications   Medication Sig Start Date End Date Taking? Authorizing Provider  aspirin 81 MG tablet Take 81 mg by mouth daily.  Historical Provider, MD  budesonide-formoterol (SYMBICORT) 160-4.5 MCG/ACT inhaler Inhale 2 puffs into the lungs 2 (two) times daily.    Historical Provider, MD  calcium-vitamin D (OSCAL) 250-125 MG-UNIT per tablet Take 1 tablet by mouth daily.      Historical Provider, MD  Cholecalciferol (VITAMIN D) 2000 UNITS tablet Take 2,000 Units by mouth daily.    Historical Provider, MD  Echinacea 400 MG CAPS Take 400 mg by mouth daily.    Historical Provider, MD  fish oil-omega-3 fatty acids 1000 MG capsule Take 2 g by mouth daily.       Historical Provider, MD  folic acid (FOLVITE) 1 MG tablet Take 1 mg by mouth daily.    Historical Provider, MD  gabapentin (NEURONTIN) 300 MG capsule Take 1 capsule (300 mg total) by mouth 3 (three) times daily. 07/09/15   Gery Pray, MD  KLOR-CON M20 20 MEQ tablet TAKE 1 TABLET BY MOUTH DAILY 12/10/14   Volanda Napoleon, MD  lactulose [facility]) 10 GM/15ML solution Take 10 g by mouth 3 (three) times daily. Reported on 07/22/2015    Historical Provider, MD  lidocaine-prilocaine (EMLA) cream Apply topically.    Historical Provider, MD  omeprazole (PRILOSEC) 40 MG capsule Take 40 mg by mouth 2 (two) times daily.  12/05/13   Historical Provider, MD  oxyCODONE (OXYCONTIN) 10 mg 12 hr tablet Take 1 tablet (10 mg total) by mouth every 12 (twelve) hours. 06/23/15   Volanda Napoleon, MD  OxyCODONE HCl, Abuse Deter, (OXAYDO) 5 MG TABA Take 1 tablet by mouth every 4 (four) hours as needed.    Historical Provider, MD  polyethylene glycol powder (GLYCOLAX/MIRALAX) powder Take 17 g by mouth once. Dissolve 1 capful of powder into any liquid and take once daily. Can increase to 2 times daily if no effect after 3-4 days. Patient taking differently: Take 17 g by mouth daily. Dissolve 1 capful of powder into any liquid and take once daily. Can increase to 2 times daily if no effect after 3-4 days. 06/27/15   Forde Dandy, MD  PROAIR RESPICLICK 376 [phone] BASE) MCG/ACT AEPB Inhale 2 puffs into the lungs twice daily as needed for shortness of breath or wheezing 10/11/14   Historical Provider, MD  prochlorperazine (COMPAZINE) 10 MG tablet Take 10 mg by mouth every 6 (six) hours as needed for nausea or vomiting.  12/26/14   Historical Provider, MD  senna-docusate (SENOKOT-S) 8.6-50 MG tablet Take 1 tablet by mouth daily. 06/27/15   Forde Dandy, MD  SUMAtriptan (IMITREX) 100 MG tablet Take 100 mg by mouth as needed for migraine or headache. Reported on 04/01/2015    Historical Provider, MD    Family History Family History    Problem Relation Age of Onset  . Heart attack Father   . Hypertension Mother   . Diabetes Mother   . Congestive Heart Failure Mother   . Breast cancer Sister   . Lung disease Neg Hx     Social History Social History  Substance Use Topics  . Smoking status: Former Smoker    Packs/day: 2.00    Years: 33.00    Types: Cigarettes    Start date: 07/19/1967    Quit date: 01/23/2003  . Smokeless tobacco: Never Used  . Alcohol use No     Allergies   Demerol; Mefoxin [cefoxitin sodium in dextrose]; Topamax; and Avelox [moxifloxacin hcl in nacl]   Review of Systems Review of Systems  Constitutional: Positive for chills and fever.  Respiratory: Negative for cough, chest tightness and shortness of breath.   Cardiovascular: Negative for chest pain, palpitations and leg swelling.  Gastrointestinal: Positive for abdominal pain, nausea and vomiting. Negative for diarrhea.  Genitourinary: Negative for dysuria, flank pain, pelvic pain, vaginal bleeding, vaginal discharge and vaginal pain.  Musculoskeletal: Negative for arthralgias, myalgias, neck pain and neck stiffness.  Skin: Negative for rash.  Neurological: Positive for headaches. Negative for dizziness and weakness.  All other systems reviewed and are negative.    Physical Exam Updated Vital Signs BP 121/79 (BP Location: Right Arm)   Pulse 90   Temp 99.2 F (37.3 C) (Rectal)   Resp 16   Ht '5\' 1"'$  (1.549 m)   Wt 58.1 kg   SpO2 94%   BMI 24.19 kg/m   Physical Exam  Constitutional: She is oriented to person, place, and time. She appears well-developed and well-nourished.  Ill and weak appearing  HENT:  Head: Normocephalic.  Right Ear: External ear normal.  Left Ear: External ear normal.  Nose: Nose normal.  Tenderness to palpation over frontal and bilateral maxillary sinuses. Oral mucosa dry  Eyes: Conjunctivae are normal.  Neck: Neck supple.  No meningismus  Cardiovascular: Normal rate, regular rhythm and normal  heart sounds.   Pulmonary/Chest: Effort normal and breath sounds normal. No respiratory distress. She has no wheezes. She has no rales.  Abdominal: Soft. Bowel sounds are normal. She exhibits no distension. There is tenderness. There is no rebound.  LLQ tenderness  Musculoskeletal: She exhibits no edema.  Neurological: She is alert and oriented to person, place, and time.  Skin: Skin is warm and dry. Capillary refill takes less than 2 seconds.  Psychiatric: She has a normal mood and affect. Her behavior is normal.  Nursing note and vitals reviewed.    ED Treatments / Results  Labs (all labs ordered are listed, but only abnormal results are displayed) Labs Reviewed  COMPREHENSIVE METABOLIC PANEL - Abnormal; Notable for the following:       Result Value   CO2 21 (*)    Glucose, Bld 107 (*)    Creatinine, Ser 0.39 (*)    Calcium 7.3 (*)    ALT 12 (*)    All other components within normal limits  URINALYSIS, ROUTINE W REFLEX MICROSCOPIC (NOT AT [facility]) - Abnormal; Notable for the following:    Bilirubin Urine SMALL (*)    Ketones, ur >80 (*)    All other components within normal limits  CBC WITH DIFFERENTIAL/PLATELET - Abnormal; Notable for the following:    Hemoglobin 11.7 (*)    RDW 18.3 (*)    Lymphs Abs 0.4 (*)    All other components within normal limits  CULTURE, BLOOD (ROUTINE X 2)  CULTURE, BLOOD (ROUTINE X 2)  URINE CULTURE  LIPASE, BLOOD  I-STAT CG4 LACTIC ACID, ED  I-STAT CG4 LACTIC ACID, ED    EKG  EKG Interpretation None       Radiology Dg Chest 2 View  Result Date: 09/21/2015 CLINICAL DATA:  Fever and nausea EXAM: CHEST  2 VIEW COMPARISON:  08/12/2015 FINDINGS: Cardiac shadow is stable. Right chest wall port is again seen and stable patchy changes consistent with multifocal neoplasm slightly improved from the prior exam. No bony abnormality is noted. No new focal infiltrate is seen. Multiple areas of bony sclerosis are noted consistent with metastatic  disease. IMPRESSION: Slight improvement in the appearance of multifocal disease. Electronically Signed   By: Inez Catalina M.D.   On:  09/21/2015 14:41    Procedures Procedures (including critical care time)  Medications Ordered in ED Medications  morphine 4 MG/ML injection 4 mg (not administered)  sodium chloride 0.9 % bolus 1,000 mL (not administered)  sodium chloride 0.9 % bolus 1,000 mL (1,000 mLs Intravenous New Bag/Given 09/21/15 1521)  ondansetron (ZOFRAN) injection 4 mg (4 mg Intravenous Given 09/21/15 1518)  morphine 4 MG/ML injection 4 mg (4 mg Intravenous Given 09/21/15 1522)  iopamidol (ISOVUE-300) 61 % injection 100 mL (100 mLs Intravenous Contrast Given 09/21/15 1642)     Initial Impression / Assessment and Plan / ED Course  I have reviewed the triage vital signs and the nursing notes.  Pertinent labs & imaging results that were available during my care of the patient were reviewed by me and considered in my medical decision making (see chart for details).  Clinical Course    Patient seen and examined. Patient with history of recurrent lung cancer, currently on chemotherapy, presents to emergency department with nausea, vomiting, headache. Will check CT head and maxillofacial due to sinus pain and headache. Will get CT abdomen and pelvis for further evaluation of her emesis. Labs pending. Will start hydration with IV fluids, Zofran and morphine ordered for her symptom control.  Pt signed out at shift change pending CT head, maxillofacial, ABD/pelvis to PA Rona Ravens. Disposition based on results and symptom control.     Final Clinical Impressions(s) / ED Diagnoses   Final diagnoses:  None    New Prescriptions New Prescriptions   No medications on file     Janee Morn 09/22/15 2039    Julianne Rice, MD 09/26/15 [phone]

## 2015-09-22 DIAGNOSIS — J910 Malignant pleural effusion: Secondary | ICD-10-CM | POA: Diagnosis not present

## 2015-09-22 DIAGNOSIS — A084 Viral intestinal infection, unspecified: Secondary | ICD-10-CM | POA: Diagnosis not present

## 2015-09-22 DIAGNOSIS — C3490 Malignant neoplasm of unspecified part of unspecified bronchus or lung: Secondary | ICD-10-CM | POA: Diagnosis not present

## 2015-09-22 DIAGNOSIS — J449 Chronic obstructive pulmonary disease, unspecified: Secondary | ICD-10-CM | POA: Diagnosis not present

## 2015-09-22 DIAGNOSIS — C7951 Secondary malignant neoplasm of bone: Secondary | ICD-10-CM

## 2015-09-22 DIAGNOSIS — C7800 Secondary malignant neoplasm of unspecified lung: Secondary | ICD-10-CM | POA: Diagnosis not present

## 2015-09-22 DIAGNOSIS — I4581 Long QT syndrome: Secondary | ICD-10-CM | POA: Diagnosis not present

## 2015-09-22 DIAGNOSIS — C779 Secondary and unspecified malignant neoplasm of lymph node, unspecified: Secondary | ICD-10-CM | POA: Diagnosis not present

## 2015-09-22 DIAGNOSIS — R112 Nausea with vomiting, unspecified: Secondary | ICD-10-CM

## 2015-09-22 DIAGNOSIS — C3432 Malignant neoplasm of lower lobe, left bronchus or lung: Secondary | ICD-10-CM | POA: Diagnosis not present

## 2015-09-22 DIAGNOSIS — R1110 Vomiting, unspecified: Secondary | ICD-10-CM | POA: Diagnosis not present

## 2015-09-22 DIAGNOSIS — E860 Dehydration: Secondary | ICD-10-CM | POA: Diagnosis not present

## 2015-09-22 DIAGNOSIS — G43909 Migraine, unspecified, not intractable, without status migrainosus: Secondary | ICD-10-CM | POA: Diagnosis not present

## 2015-09-22 DIAGNOSIS — I10 Essential (primary) hypertension: Secondary | ICD-10-CM | POA: Diagnosis not present

## 2015-09-22 DIAGNOSIS — D6481 Anemia due to antineoplastic chemotherapy: Secondary | ICD-10-CM | POA: Diagnosis not present

## 2015-09-22 LAB — COMPREHENSIVE METABOLIC PANEL
ALBUMIN: 3 g/dL — AB (ref 3.5–5.0)
ALK PHOS: 81 U/L (ref 38–126)
ALT: 11 U/L — ABNORMAL LOW (ref 14–54)
ANION GAP: 6 (ref 5–15)
AST: 19 U/L (ref 15–41)
BILIRUBIN TOTAL: 0.7 mg/dL (ref 0.3–1.2)
BUN: <5 mg/dL — ABNORMAL LOW (ref 6–20)
CALCIUM: 5.9 mg/dL — AB (ref 8.9–10.3)
CO2: 19 mmol/L — AB (ref 22–32)
Chloride: 109 mmol/L (ref 101–111)
Creatinine, Ser: 0.36 mg/dL — ABNORMAL LOW (ref 0.44–1.00)
GFR calc non Af Amer: >60 mL/min (ref >60)
Glucose, Bld: 78 mg/dL (ref 65–99)
POTASSIUM: 3.3 mmol/L — AB (ref 3.5–5.1)
SODIUM: 134 mmol/L — AB (ref 135–145)
TOTAL PROTEIN: 5.5 g/dL — AB (ref 6.5–8.1)

## 2015-09-22 LAB — CBC
HCT: 31 % — ABNORMAL LOW (ref 36.0–46.0)
HEMOGLOBIN: 9.8 g/dL — AB (ref 12.0–15.0)
MCH: 27.3 pg (ref 26.0–34.0)
MCHC: 31.6 g/dL (ref 30.0–36.0)
MCV: 86.4 fL (ref 78.0–100.0)
Platelets: 187 10*3/uL (ref 150–400)
RBC: 3.59 MIL/uL — AB (ref 3.87–5.11)
RDW: 18.5 % — ABNORMAL HIGH (ref 11.5–15.5)
WBC: 2.6 10*3/uL — ABNORMAL LOW (ref 4.0–10.5)

## 2015-09-22 LAB — URINE CULTURE

## 2015-09-22 LAB — TSH: TSH: 1.466 u[IU]/mL (ref 0.350–4.500)

## 2015-09-22 LAB — MAGNESIUM: MAGNESIUM: 1.5 mg/dL — AB (ref 1.7–2.4)

## 2015-09-22 LAB — PHOSPHORUS: Phosphorus: 1.7 mg/dL — ABNORMAL LOW (ref 2.5–4.6)

## 2015-09-22 MED ORDER — GABAPENTIN 300 MG PO CAPS
300.0000 mg | ORAL_CAPSULE | Freq: Three times a day (TID) | ORAL | Status: DC
  Administered 2015-09-22 – 2015-09-24 (×4): 300 mg via ORAL
  Filled 2015-09-22 (×6): qty 1

## 2015-09-22 MED ORDER — FOLIC ACID 1 MG PO TABS
1.0000 mg | ORAL_TABLET | Freq: Every day | ORAL | Status: DC
  Administered 2015-09-22 – 2015-09-24 (×2): 1 mg via ORAL
  Filled 2015-09-22 (×3): qty 1

## 2015-09-22 MED ORDER — POTASSIUM PHOSPHATES 15 MMOLE/5ML IV SOLN
30.0000 mmol | Freq: Once | INTRAVENOUS | Status: AC
  Administered 2015-09-22: 30 mmol via INTRAVENOUS
  Filled 2015-09-22: qty 10

## 2015-09-22 MED ORDER — MOMETASONE FURO-FORMOTEROL FUM 200-5 MCG/ACT IN AERO
2.0000 | INHALATION_SPRAY | Freq: Two times a day (BID) | RESPIRATORY_TRACT | Status: DC
  Filled 2015-09-22: qty 8.8

## 2015-09-22 MED ORDER — OXYCODONE HCL ER 10 MG PO T12A
10.0000 mg | EXTENDED_RELEASE_TABLET | Freq: Two times a day (BID) | ORAL | Status: DC
  Administered 2015-09-22 – 2015-09-24 (×6): 10 mg via ORAL
  Filled 2015-09-22 (×6): qty 1

## 2015-09-22 MED ORDER — POTASSIUM CHLORIDE CRYS ER 20 MEQ PO TBCR
20.0000 meq | EXTENDED_RELEASE_TABLET | Freq: Every day | ORAL | Status: DC
  Administered 2015-09-24: 20 meq via ORAL
  Filled 2015-09-22 (×3): qty 1

## 2015-09-22 MED ORDER — SODIUM CHLORIDE 0.9% FLUSH
10.0000 mL | INTRAVENOUS | Status: DC | PRN
  Administered 2015-09-23: 10 mL
  Filled 2015-09-22: qty 40

## 2015-09-22 MED ORDER — POLYETHYLENE GLYCOL 3350 17 GM/SCOOP PO POWD: 17.0000 g | Freq: Every day | ORAL | Status: DC

## 2015-09-22 MED ORDER — SUMATRIPTAN SUCCINATE 100 MG PO TABS
100.0000 mg | ORAL_TABLET | ORAL | Status: DC | PRN
  Administered 2015-09-22 – 2015-09-24 (×5): 100 mg via ORAL
  Filled 2015-09-22 (×9): qty 1

## 2015-09-22 MED ORDER — KETOROLAC TROMETHAMINE 15 MG/ML IJ SOLN
15.0000 mg | Freq: Four times a day (QID) | INTRAMUSCULAR | Status: DC | PRN
  Administered 2015-09-22 – 2015-09-23 (×2): 15 mg via INTRAVENOUS
  Filled 2015-09-22 (×2): qty 1

## 2015-09-22 MED ORDER — PANTOPRAZOLE SODIUM 40 MG PO TBEC
40.0000 mg | DELAYED_RELEASE_TABLET | Freq: Every day | ORAL | Status: DC
  Administered 2015-09-24: 40 mg via ORAL
  Filled 2015-09-22 (×3): qty 1

## 2015-09-22 MED ORDER — PROCHLORPERAZINE 25 MG RE SUPP
25.0000 mg | Freq: Two times a day (BID) | RECTAL | Status: DC | PRN
  Administered 2015-09-22 – 2015-09-23 (×2): 25 mg via RECTAL
  Filled 2015-09-22 (×3): qty 1

## 2015-09-22 MED ORDER — SODIUM CHLORIDE 0.9 % IV SOLN
1.0000 g | Freq: Once | INTRAVENOUS | Status: AC
  Administered 2015-09-22: 1 g via INTRAVENOUS
  Filled 2015-09-22: qty 10

## 2015-09-22 MED ORDER — POTASSIUM CHLORIDE 10 MEQ/100ML IV SOLN: 10.0000 meq | INTRAVENOUS | Status: DC

## 2015-09-22 MED ORDER — ACETAMINOPHEN 325 MG PO TABS: 650.0000 mg | ORAL_TABLET | Freq: Four times a day (QID) | ORAL | Status: DC | PRN

## 2015-09-22 MED ORDER — POLYETHYLENE GLYCOL 3350 17 G PO PACK
17.0000 g | PACK | Freq: Every day | ORAL | Status: DC
  Administered 2015-09-22 – 2015-09-24 (×2): 17 g via ORAL
  Filled 2015-09-22 (×3): qty 1

## 2015-09-22 MED ORDER — MAGNESIUM SULFATE 2 GM/50ML IV SOLN
2.0000 g | Freq: Once | INTRAVENOUS | Status: AC
  Administered 2015-09-22: 2 g via INTRAVENOUS
  Filled 2015-09-22: qty 50

## 2015-09-22 MED ORDER — SODIUM CHLORIDE 0.9 % IV SOLN
INTRAVENOUS | Status: DC
  Administered 2015-09-22 (×2): via INTRAVENOUS

## 2015-09-22 MED ORDER — SODIUM CHLORIDE 0.9 % IV BOLUS (SEPSIS)
1000.0000 mL | Freq: Once | INTRAVENOUS | Status: AC
  Administered 2015-09-22: 1000 mL via INTRAVENOUS

## 2015-09-22 MED ORDER — SODIUM CHLORIDE 0.9% FLUSH
3.0000 mL | Freq: Two times a day (BID) | INTRAVENOUS | Status: DC
  Administered 2015-09-23: 3 mL via INTRAVENOUS

## 2015-09-22 MED ORDER — ALBUTEROL SULFATE (2.5 MG/3ML) 0.083% IN NEBU: 2.5000 mg | INHALATION_SOLUTION | RESPIRATORY_TRACT | Status: DC | PRN

## 2015-09-22 MED ORDER — LACTULOSE 10 GM/15ML PO SOLN
10.0000 g | Freq: Every day | ORAL | Status: DC
  Administered 2015-09-22 – 2015-09-24 (×2): 10 g via ORAL
  Filled 2015-09-22 (×3): qty 15

## 2015-09-22 MED ORDER — OXYCODONE HCL 5 MG PO TABS
5.0000 mg | ORAL_TABLET | ORAL | Status: DC | PRN
  Administered 2015-09-23 (×2): 5 mg via ORAL
  Filled 2015-09-22 (×2): qty 1

## 2015-09-22 MED ORDER — ENOXAPARIN SODIUM 40 MG/0.4ML ~~LOC~~ SOLN
40.0000 mg | SUBCUTANEOUS | Status: DC
  Administered 2015-09-22 – 2015-09-24 (×2): 40 mg via SUBCUTANEOUS
  Filled 2015-09-22 (×3): qty 0.4

## 2015-09-22 MED ORDER — ACETAMINOPHEN 650 MG RE SUPP: 650.0000 mg | Freq: Four times a day (QID) | RECTAL | Status: DC | PRN

## 2015-09-22 NOTE — Progress Notes (Signed)
EKG performed this am at 0530, just noticed it did not download.  Hard copy placed in chart, QTc much improved at 495, was 640 previously at [place]. [name] P [name]

## 2015-09-22 NOTE — Progress Notes (Signed)
PROGRESS NOTE  [name] DJM:[id number] DOB: 12-08-1950 DOA: 09/21/2015 PCP: Myrlene Broker, MD  Brief Narrative: 64 year old woman with metastatic adenocarcinoma of the lung presented to the emergency department with nausea, vomiting, low-grade fever, fatigue. Last chemotherapy 5 days ago. Admitted for nausea, vomiting, suspected gastroenteritis, dehydration.  Assessment/Plan: 1. Nausea, vomiting, gastroenteritis. Still nauseous. Poor appetite. CT abdomen unremarkable. CT head no acute lesions. Urinalysis negative. 2. Dehydration. Improving with IV fluids. 3. COPD. appears stable. 4. Anemia secondary to chemotherapy 5. Prolonged QT. Improved today. 6. Metastatic adenocarcinoma the lung - bone metastases, lymph node metastasis and intrapulmonary metastasis. Malignant left pleural effusion Aortic atherosclerosis    Continue supportive care, fluids, antiemetics.  Replace potassium, magnesium, calcium, phosphorus  Repeat BMP, magnesium and phosphorus in the morning  DVT prophylaxis: Lovenox Code Status: full code Family Communication: husband, sister at bedside Disposition Plan: home  Murray Hodgkins, MD  [facility] Direct contact: [phone] --[place] [place]  --[email or web address]; password [email or web address]  7PM-7AM contact night coverage as above 09/22/2015, 10:41 AM  LOS: 0 days   Consultants:  Oncology  Procedures:    Antimicrobials:    HPI/Subjective: Nauseous. Poor appetite. No abdominal pain. Headache.  Objective: Vitals:   09/21/15 2146 09/21/15 2245 09/22/15 0600 09/22/15 0835  BP: 127/87 122/89 114/62 122/74  Pulse: 90 91  87  Resp: '19 17 20 16  '$ Temp: 98.3 F (36.8 C) 98.3 F (36.8 C) 97.9 F (36.6 C) 97.9 F (36.6 C)  TempSrc:  Oral Oral Oral  SpO2: 91% 97% 100% 92%  Weight:      Height:        Intake/Output Summary (Last 24 hours) at 09/22/15 1041 Last data filed at 09/22/15 0958  Gross per 24 hour  Intake           1562.5 ml  Output              1050 ml  Net            512.5 ml     Filed Weights   09/21/15 1333 09/21/15 1342  Weight: 58.1 kg (128 lb) 58.1 kg (128 lb)    Exam:    Constitutional:  . Appears Uncomfortable but is calm, nontoxic Respiratory:  . CTA bilaterally, no w/r/r.  . Respiratory effort normal. No retractions or accessory muscle use Cardiovascular:  . RRR, no m/r/g . No LE extremity edema   Abdomen:  . Soft, ntnd Psychiatric:  . judgement and insight appear normal . Mental status o Mood, affect appropriate  I have personally reviewed following labs and imaging studies:  Potassium 3.3  Calcium 5.9  Phosphorus 1.7  Magnesium 1.5  Hemoglobin 9.8  EKG sinus rhythm, prolonged QT  Scheduled Meds: . enoxaparin (LOVENOX) injection  40 mg Subcutaneous Q24H  . folic acid  1 mg Oral Daily  . gabapentin  300 mg Oral TID  . lactulose  10 g Oral Daily  . mometasone-formoterol  2 puff Inhalation BID  . oxyCODONE  10 mg Oral Q12H  . pantoprazole  40 mg Oral Daily  . polyethylene glycol  17 g Oral Daily  . potassium chloride SA  20 mEq Oral Daily  . sodium chloride flush  3 mL Intravenous Q12H   Continuous Infusions:   Active Problems:   COPD (chronic obstructive pulmonary disease) (H[facility])   Bone metastasis (H[facility])   Essential (primary) hypertension   Cancer of lung ([facility])   Malignant neoplasm of overlapping sites of unspecified bronchus and lung ([place])  Nausea & vomiting   Headache   Prolonged QT interval   Dehydration   LOS: 0 days   Time spent 20 minutes

## 2015-09-22 NOTE — Progress Notes (Signed)
Dr Martha Clan wants port accessed.  Pt states she already had lab drawn and has working iv and would like to not access it. Will ask hospitalist when rounding. Pierre Cumpton P Takela Varden

## 2015-09-22 NOTE — Consult Note (Signed)
Referral MD  Reason for Referral: Nausea and vomiting, metastatic lung cancer   Chief Complaint  Patient presents with  . Emesis  : I started getting sick of having dinner on Sunday.  HPI: [name] is well-known to me. She is a very nice 65 year old white female. She has metastatic adenocarcinoma of the lung area and she currently is on treatment with Abraxane/Avastin. She has had 3 cycles to date. So far, she has done well with her disease being fairly stable. Her last treatment was on August 9.  She had a birthday dinner on Sunday. She went to a restaurant. She did not have anything unusual. On the way home, she began to get sick. Her husband had to pullover so she could throw up.  She has really not gotten sick with chemotherapy in the past.  Freda Munro bit of a temperature.  She had to go to the emergency room. She had a CT scan of the brain done. This did not show any evidence of metastatic disease. She had CT of the face done. She has sclerotic metastasis in the cervical spine.  A CT of the abdomen pulse was done. There is no evidence of obstruction. She had stable left lower lung masses. There was an ill-defined airspace opacity in the central right lower lobe.  Lab studies that were done were pretty much unrevealing. There is no hyper calcemic. Her liver function tests looked okay. She is not neutropenic.  She's getting IV fluids.  Cultures are pending.  She feels a little bit better right now. There is no abdominal pain. She's had no diarrhea. She's had no dysuria. She's had no cough. There's been no leg swelling. She's had no rashes.  I must say, that she actually looks pretty good.    Past Medical History:  Diagnosis Date  . B12 deficiency   . Cancer [facility]) 2005 & 2008   Right Upper Lobe (2005) & Left Upper Lobe (2008). Subsequent Nodules treated empirically in 2010 & 2011 w/ XRT & chemo.  Marland Kitchen GERD (gastroesophageal reflux disease)   . Hypertension   . Lung cancer,  primary, with metastasis from lung to other site [facility]) 04/01/2015  . Pleural effusion, malignant 04/01/2015  . Radiation 07/03/15-07/16/15   C3 spine 30 Gy  . Stroke ([place])   . Vitamin D deficiency   :  Past Surgical History:  Procedure Laterality Date  . APPENDECTOMY    . BRONCHOSCOPY    . CHOLECYSTECTOMY    . cyst removed from ovary  1968  . HIATAL HERNIA REPAIR  2010  . LUNG REMOVAL, PARTIAL  2005, 2008   R upper lobe and left upper lobe wedge  . MEDIASTINOSCOPY    . VAGINAL HYSTERECTOMY  1983  :   Current Facility-Administered Medications:  .  0.9 %  sodium chloride infusion, , Intravenous, Continuous, Toy Baker, MD, Last Rate: 125 mL/hr at 09/22/15 0130 .  acetaminophen (TYLENOL) tablet 650 mg, 650 mg, Oral, Q6H PRN **OR** acetaminophen (TYLENOL) suppository 650 mg, 650 mg, Rectal, Q6H PRN, Toy Baker, MD .  albuterol (PROVENTIL) (2.5 MG/3ML) 0.083% nebulizer solution 2.5 mg, 2.5 mg, Nebulization, Q2H PRN, Toy Baker, MD .  enoxaparin (LOVENOX) injection 40 mg, 40 mg, Subcutaneous, Q24H, Toy Baker, MD .  folic acid (FOLVITE) tablet 1 mg, 1 mg, Oral, Daily, Toy Baker, MD .  gabapentin (NEURONTIN) capsule 300 mg, 300 mg, Oral, TID, Toy Baker, MD .  ketorolac (TORADOL) 15 MG/ML injection 15 mg, 15 mg, Intravenous, Q6H PRN,  Toy Baker, MD .  lactulose ([place]) 10 GM/15ML solution 10 g, 10 g, Oral, Daily, Toy Baker, MD .  mometasone-formoterol (DULERA) 200-5 MCG/ACT inhaler 2 puff, 2 puff, Inhalation, BID, Toy Baker, MD .  oxyCODONE (Oxy IR/ROXICODONE) immediate release tablet 5 mg, 5 mg, Oral, Q4H PRN, Toy Baker, MD .  oxyCODONE (OXYCONTIN) 12 hr tablet 10 mg, 10 mg, Oral, Q12H, Toy Baker, MD, 10 mg at 09/22/15 0045 .  pantoprazole (PROTONIX) EC tablet 40 mg, 40 mg, Oral, Daily, Toy Baker, MD .  polyethylene glycol (MIRALAX / GLYCOLAX) packet 17 g, 17 g, Oral, Daily, Toy Baker, MD .  potassium chloride SA (K-DUR,KLOR-CON) CR tablet 20 mEq, 20 mEq, Oral, Daily, Toy Baker, MD .  sodium chloride flush (NS) 0.9 % injection 3 mL, 3 mL, Intravenous, Q12H, Toy Baker, MD .  SUMAtriptan (IMITREX) tablet 100 mg, 100 mg, Oral, PRN, Toy Baker, MD, 100 mg at 09/22/15 0046  Facility-Administered Medications Ordered in Other Encounters:  .  morphine 4 MG/ML injection 2 mg, 2 mg, Intravenous, Q2H PRN, Volanda Napoleon, MD, 2 mg at 07/22/15 1418 .  sodium chloride 0.9 % injection 10 mL, 10 mL, Intravenous, PRN, Golden Pop, FNP, 10 mL at 12/06/13 1149:  . enoxaparin (LOVENOX) injection  40 mg Subcutaneous Q24H  . folic acid  1 mg Oral Daily  . gabapentin  300 mg Oral TID  . lactulose  10 g Oral Daily  . mometasone-formoterol  2 puff Inhalation BID  . oxyCODONE  10 mg Oral Q12H  . pantoprazole  40 mg Oral Daily  . polyethylene glycol  17 g Oral Daily  . potassium chloride SA  20 mEq Oral Daily  . sodium chloride flush  3 mL Intravenous Q12H  :  Allergies  Allergen Reactions  . Demerol Other (See Comments)    Syncope  . Mefoxin [Cefoxitin Sodium In Dextrose]     rash  . Topamax Other (See Comments)    Had trouble walking  . Avelox [Moxifloxacin Hcl In Nacl]     Hypotension.  :  Family History  Problem Relation Age of Onset  . Heart attack Father   . Hypertension Mother   . Diabetes Mother   . Congestive Heart Failure Mother   . Breast cancer Sister   . Lung disease Neg Hx   :  Social History   Social History  . Marital status: Married    Spouse name: N/A  . Number of children: 3  . Years of education: N/A   Occupational History  . Not on file.   Social History Main Topics  . Smoking status: Former Smoker    Packs/day: 2.00    Years: 33.00    Types: Cigarettes    Start date: 07/19/1967    Quit date: 01/23/2003  . Smokeless tobacco: Never Used  . Alcohol use No  . Drug use: No  . Sexual activity: Not on file    Other Topics Concern  . Not on file   Social History Narrative   Originally from [place]. She has always lived in [place]. Never traveled internationally. Remote travel to [facility] [place]. She has a dog at home. Remote bird exposure to a parakeet. No hot tub exposure. Remote exposure to mold in their bathroom that was professionally remediated. Previously worked in a [employer]. No exposure to fumes or dusts & wore a mask.   :  Pertinent items are noted in HPI.  Exam: Patient Vitals  for the past 24 hrs:  BP Temp Temp src Pulse Resp SpO2 Height Weight  09/22/15 0600 114/62 97.9 F (36.6 C) Oral - 20 100 % - -  09/21/15 2245 122/89 98.3 F (36.8 C) Oral 91 17 97 % - -  09/21/15 2146 127/87 98.3 F (36.8 C) - 90 19 91 % - -  09/21/15 2030 114/82 - - 89 15 90 % - -  09/21/15 2000 123/83 - - 88 15 90 % - -  09/21/15 1930 (!) 136/107 - - 87 20 92 % - -  09/21/15 1900 125/90 98 F (36.7 C) Oral 85 14 90 % - -  09/21/15 1830 125/83 - - 86 15 91 % - -  09/21/15 1800 120/87 - - 89 15 90 % - -  09/21/15 1730 - - - 86 12 (!) 89 % - -  09/21/15 1607 121/79 - - 90 16 94 % - -  09/21/15 1530 114/83 - - 97 - 93 % - -  09/21/15 1500 109/82 - - 98 - 94 % - -  09/21/15 1441 110/83 - - 101 18 96 % - -  09/21/15 1405 113/80 99.2 F (37.3 C) Rectal 109 - 94 % - -  09/21/15 1342 - - - - - - '5\' 1"'$  (1.549 m) 128 lb (58.1 kg)  09/21/15 1333 146/92 98.6 F (37 C) Oral 106 18 96 % '5\' 1"'$  (1.549 m) 128 lb (58.1 kg)    Well-developed well-nourished white female. Abdominal exam is soft. Bowel sounds are slightly decreased. There is no guarding or rebound tenderness. There is no fluid wave. Lungs are clear. Cardiac exam regular rate and rhythm with no murmurs, rubs or bruits. Head and neck exam shows no oral lesions. There is no mucositis. Skin exam shows no rashes. Neurological exam shows no focal neurological deficits.    Recent Labs  09/21/15 1420 09/22/15 0558  WBC 4.4 2.6*  HGB 11.7*  9.8*  HCT 36.1 31.0*  PLT 229 187    Recent Labs  09/21/15 1420 09/22/15 0558  NA 135 134*  K 3.8 3.3*  CL 106 109  CO2 21* 19*  GLUCOSE 107* 78  BUN 11 <5*  CREATININE 0.39* 0.36*  CALCIUM 7.3* 5.9*    Blood smear review:  None  Pathology: None     Assessment and Plan:  Ms. Hartung is a very nice 65 year old white female. She has metastatic non-small cell lung cancer. She has relatively stable disease.  It is hard to say what has caused this. Concerning could be some that she may have eaten. Typically, this chemotherapy protocol does not have issues with nausea or vomiting.  I cannot see anything on her CT scan of the abdomen that looks suspicious. I know that we are using Avastin which can cause bowel perforation but there is nothing to is just that this is a problem right now.  I totally agree with how she is managed right now. The IV fluids certainly is a good idea. I really believe that her Port-A-Cath needs to be utilized so that the IVs can be taken out of her arm.  I cannot think of any else that needs to be done right now. It will be interesting to see what her urine culture shows. Possibly she may have a urinary tract infection.  I appreciate everybody's help with her. She is obviously getting fantastic care from everybody up on [facility], MD  Darlyn Chamber 17:14

## 2015-09-23 ENCOUNTER — Ambulatory Visit: Payer: PPO | Admitting: Hematology & Oncology

## 2015-09-23 ENCOUNTER — Other Ambulatory Visit: Payer: PPO

## 2015-09-23 ENCOUNTER — Ambulatory Visit: Payer: PPO

## 2015-09-23 DIAGNOSIS — I4581 Long QT syndrome: Secondary | ICD-10-CM | POA: Diagnosis not present

## 2015-09-23 DIAGNOSIS — Z9049 Acquired absence of other specified parts of digestive tract: Secondary | ICD-10-CM | POA: Diagnosis not present

## 2015-09-23 DIAGNOSIS — K219 Gastro-esophageal reflux disease without esophagitis: Secondary | ICD-10-CM | POA: Diagnosis not present

## 2015-09-23 DIAGNOSIS — Z87891 Personal history of nicotine dependence: Secondary | ICD-10-CM | POA: Diagnosis not present

## 2015-09-23 DIAGNOSIS — I10 Essential (primary) hypertension: Secondary | ICD-10-CM | POA: Diagnosis not present

## 2015-09-23 DIAGNOSIS — Z803 Family history of malignant neoplasm of breast: Secondary | ICD-10-CM | POA: Diagnosis not present

## 2015-09-23 DIAGNOSIS — K4020 Bilateral inguinal hernia, without obstruction or gangrene, not specified as recurrent: Secondary | ICD-10-CM | POA: Diagnosis not present

## 2015-09-23 DIAGNOSIS — J910 Malignant pleural effusion: Secondary | ICD-10-CM | POA: Diagnosis not present

## 2015-09-23 DIAGNOSIS — C3490 Malignant neoplasm of unspecified part of unspecified bronchus or lung: Secondary | ICD-10-CM

## 2015-09-23 DIAGNOSIS — C7951 Secondary malignant neoplasm of bone: Secondary | ICD-10-CM | POA: Diagnosis not present

## 2015-09-23 DIAGNOSIS — R51 Headache: Secondary | ICD-10-CM | POA: Diagnosis not present

## 2015-09-23 DIAGNOSIS — C779 Secondary and unspecified malignant neoplasm of lymph node, unspecified: Secondary | ICD-10-CM | POA: Diagnosis not present

## 2015-09-23 DIAGNOSIS — I2510 Atherosclerotic heart disease of native coronary artery without angina pectoris: Secondary | ICD-10-CM | POA: Diagnosis not present

## 2015-09-23 DIAGNOSIS — A084 Viral intestinal infection, unspecified: Secondary | ICD-10-CM | POA: Diagnosis not present

## 2015-09-23 DIAGNOSIS — Z90710 Acquired absence of both cervix and uterus: Secondary | ICD-10-CM | POA: Diagnosis not present

## 2015-09-23 DIAGNOSIS — E860 Dehydration: Secondary | ICD-10-CM | POA: Diagnosis not present

## 2015-09-23 DIAGNOSIS — T451X5A Adverse effect of antineoplastic and immunosuppressive drugs, initial encounter: Secondary | ICD-10-CM | POA: Diagnosis not present

## 2015-09-23 DIAGNOSIS — I700 Atherosclerosis of aorta: Secondary | ICD-10-CM | POA: Diagnosis not present

## 2015-09-23 DIAGNOSIS — E889 Metabolic disorder, unspecified: Secondary | ICD-10-CM | POA: Diagnosis present

## 2015-09-23 DIAGNOSIS — Z8673 Personal history of transient ischemic attack (TIA), and cerebral infarction without residual deficits: Secondary | ICD-10-CM | POA: Diagnosis not present

## 2015-09-23 DIAGNOSIS — G43909 Migraine, unspecified, not intractable, without status migrainosus: Secondary | ICD-10-CM | POA: Diagnosis not present

## 2015-09-23 DIAGNOSIS — R112 Nausea with vomiting, unspecified: Secondary | ICD-10-CM | POA: Diagnosis not present

## 2015-09-23 DIAGNOSIS — I739 Peripheral vascular disease, unspecified: Secondary | ICD-10-CM | POA: Diagnosis not present

## 2015-09-23 DIAGNOSIS — D6481 Anemia due to antineoplastic chemotherapy: Secondary | ICD-10-CM | POA: Diagnosis not present

## 2015-09-23 DIAGNOSIS — C7800 Secondary malignant neoplasm of unspecified lung: Secondary | ICD-10-CM | POA: Diagnosis not present

## 2015-09-23 DIAGNOSIS — Z87440 Personal history of urinary (tract) infections: Secondary | ICD-10-CM | POA: Diagnosis not present

## 2015-09-23 DIAGNOSIS — J449 Chronic obstructive pulmonary disease, unspecified: Secondary | ICD-10-CM | POA: Diagnosis not present

## 2015-09-23 LAB — BASIC METABOLIC PANEL
ANION GAP: 3 — AB (ref 5–15)
ANION GAP: 4 — AB (ref 5–15)
BUN: <5 mg/dL — ABNORMAL LOW (ref 6–20)
CALCIUM: 7.5 mg/dL — AB (ref 8.9–10.3)
CHLORIDE: 111 mmol/L (ref 101–111)
CHLORIDE: 108 mmol/L (ref 101–111)
CO2: 25 mmol/L (ref 22–32)
CO2: 27 mmol/L (ref 22–32)
Calcium: 6.4 mg/dL — CL (ref 8.9–10.3)
Creatinine, Ser: 0.42 mg/dL — ABNORMAL LOW (ref 0.44–1.00)
GFR calc non Af Amer: >60 mL/min (ref >60)
Glucose, Bld: 88 mg/dL (ref 65–99)
Glucose, Bld: 132 mg/dL — ABNORMAL HIGH (ref 65–99)
POTASSIUM: 3.5 mmol/L (ref 3.5–5.1)
Potassium: 3.5 mmol/L (ref 3.5–5.1)
SODIUM: 139 mmol/L (ref 135–145)
SODIUM: 139 mmol/L (ref 135–145)

## 2015-09-23 LAB — MAGNESIUM
Magnesium: 2.2 mg/dL (ref 1.7–2.4)
Magnesium: 1.9 mg/dL (ref 1.7–2.4)

## 2015-09-23 LAB — PHOSPHORUS
PHOSPHORUS: 1.6 mg/dL — AB (ref 2.5–4.6)
PHOSPHORUS: 2 mg/dL — AB (ref 2.5–4.6)

## 2015-09-23 MED ORDER — ACETAMINOPHEN 500 MG PO TABS
1000.0000 mg | ORAL_TABLET | Freq: Three times a day (TID) | ORAL | Status: DC
  Administered 2015-09-24 (×2): 1000 mg via ORAL
  Filled 2015-09-23 (×3): qty 2

## 2015-09-23 MED ORDER — SODIUM CHLORIDE 0.9 % IV SOLN
1.0000 g | Freq: Once | INTRAVENOUS | Status: AC
  Administered 2015-09-23: 1 g via INTRAVENOUS
  Filled 2015-09-23: qty 10

## 2015-09-23 MED ORDER — METHOCARBAMOL 1000 MG/10ML IJ SOLN
500.0000 mg | Freq: Three times a day (TID) | INTRAVENOUS | Status: DC | PRN
  Administered 2015-09-23: 500 mg via INTRAVENOUS
  Filled 2015-09-23: qty 550
  Filled 2015-09-23: qty 5

## 2015-09-23 MED ORDER — CALCIUM CARBONATE 1250 (500 CA) MG PO TABS
1.0000 | ORAL_TABLET | Freq: Every day | ORAL | Status: DC
  Administered 2015-09-24: 500 mg via ORAL
  Filled 2015-09-23 (×2): qty 1

## 2015-09-23 MED ORDER — ACETAMINOPHEN 650 MG RE SUPP
650.0000 mg | Freq: Three times a day (TID) | RECTAL | Status: DC
  Filled 2015-09-23: qty 1

## 2015-09-23 MED ORDER — K PHOS MONO-SOD PHOS DI & MONO 155-852-130 MG PO TABS
500.0000 mg | ORAL_TABLET | Freq: Every day | ORAL | Status: DC
  Administered 2015-09-24: 500 mg via ORAL
  Filled 2015-09-23 (×2): qty 2

## 2015-09-23 NOTE — Progress Notes (Addendum)
Pt c/o headache. Imitrex given. Pt said headache was unrelieved by this medication. Pt given toradol and oxycodone IR. Shortly after administration of these two medications pt was nauseated and vomited small amount x1.  MD paged. New orders placed to combat N/V and medications to be given once they arrive from pharmacy. Will continue to monitor. Pt requested no further medications until N/V/ subsides.

## 2015-09-23 NOTE — Progress Notes (Signed)
Ms. Shaheen is feeling a little bit better. She kept down some liquids yesterday. She has a little bit of a headache today.  Her cultures so far have been negative.  Her potassium was a little bit low. Her calcium has been quite low.  She's not having any diarrhea.  Yesterday, her white cell count was 2.6. Hemoglobin 9.8. I'm sure some of this might be from chemotherapy effect.  She has had no fever. She has had no cough or shortness of breath.  Her hoarseness seems to be doing better. She was supposed to see the ENT doctor tomorrow. I think we will have to reschedule this.  She has had no rashes. Her pain control seems to be doing pretty well.  On her physical exam, her temperature is 97.5. Pulse is 80. Blood pressure 127/86. Oral exam shows no mucositis. There is no adenopathy in her neck. Lungs are relatively clear bilaterally. There may be a couple rhonchi bilaterally. Cardiac exam regular rate and rhythm with no murmurs rubs or bruits. Abdomen is soft. She has decreased bowel sounds. There is no guarding or rebound tenderness. Extremities shows no clubbing, cyanosis or edema. Skin exam shows no rashes.  I would have to think that is [place] is having some kind of viral type gastroenteritis. I would not think this is from chemotherapy. She's had chemotherapy for several cycles and has never had a problem like this.  Hopefully, she will be able to eat a little bit more today. I'm still not sure she is ready to go home. Hopefully one more day of fluids will help her out.  I am very thankful and grateful for the outstanding care that she is getting from everybody up on [place]. She told me that she is very appreciative of their concerns and they're attentiveness.  I asked her if I could pray for her. She was incredibly pleased to be able to pray with me. As such, we had a very good prayer session area and it really made her feel better.  Lattie Haw, MD  Psalm 143:10

## 2015-09-23 NOTE — Progress Notes (Signed)
CRITICAL VALUE ALERT  Critical value received:  Ca 6.4  Date of notification:  09/23/15  Time of notification:  0558  Critical value read back:Yes.    Nurse who received alert:  Nancy Fetter, RN  MD notified (1st page):  Baltazar Najjar, NP  Time of first page:  0600  MD notified (2nd page):  Time of second page:  Responding MD:    Time MD responded:

## 2015-09-23 NOTE — Progress Notes (Signed)
PROGRESS NOTE  [name] ACZ:[id number] DOB: 16-Jan-1951 DOA: 09/21/2015 PCP: Myrlene Broker, MD  Brief Narrative: 65 year old woman with metastatic adenocarcinoma of the lung presented to the emergency department with nausea, vomiting, low-grade fever, fatigue. Last chemotherapy 7 days ago. Admitted for nausea, vomiting, suspected gastroenteritis, dehydration.  Assessment/Plan: 1. Nausea, vomiting, gastroenteritis. Nausea resolved and appetite improving. CT abdomen unremarkable. CT head no acute lesions. Urinalysis negative. 2. Dehydration. Resolved with IV fluids. 3. HA: h/o migraines. Onset during this admission. Suspect from underlying migraine condition in setting of acute stress from gastro and dramatic metabolic changes. Continue Imitrex, add toradol, and scheduled tylenol 4. COPD. appears stable. 5. Anemia secondary to chemotherapy. Stable  6. Prolonged QT.Resolved - repeat EKG in am given continued metabolic derangements 7. Metastatic adenocarcinoma the lung - bone metastases, lymph node metastasis and intrapulmonary metastasis. Malignant left pleural effusion Aortic atherosclerosis    Continue supportive care, fluids, antiemetics.  Replace calcium, phosphorus w/ afternoon and am lab checks (mag and K nml as of this am)  HA mgt as above  Continue gentle IVF as appetite remains poor but imroving  DVT prophylaxis: Lovenox Code Status: full code Family Communication: husband, sister at bedside Disposition Plan: home  Linna Darner, MD  [facility] --[place] --[email or web address]; password [email or web address]  7PM-7AM contact night coverage as above 09/23/2015, 8:52 AM  LOS: 0 days   Consultants:  Oncology  Procedures:    Antimicrobials:    HPI/Subjective: Appetite improving, nausea and abd pain have resolved. HA continues unchanged from previous day despite imitrex.    Objective: Vitals:   09/22/15 1901 09/22/15 2102 09/23/15 0008 09/23/15 0555  BP:  124/87 119/78 116/83 127/86  Pulse: 88 90 90 80  Resp: '16 17 18 16  '$ Temp: 98.5 F (36.9 C) 98.5 F (36.9 C) 98.3 F (36.8 C) 97.5 F (36.4 C)  TempSrc: Oral Oral Oral Oral  SpO2: 99% 97% 96% 99%  Weight:      Height:        Intake/Output Summary (Last 24 hours) at 09/23/15 0852 Last data filed at 09/22/15 1903  Gross per 24 hour  Intake              180 ml  Output             2950 ml  Net            -2770 ml     Filed Weights   09/21/15 1333 09/21/15 1342  Weight: 58.1 kg (128 lb) 58.1 kg (128 lb)    Exam:    Constitutional:  . Appears Uncomfortable but is calm, nontoxic Respiratory:  . CTA bilaterally, no w/r/r.  . Respiratory effort normal. No retractions or accessory muscle use Cardiovascular:  . RRR, no m/r/g . No LE extremity edema   Abdomen:  . Soft, ntnd Psychiatric:  . judgement and insight appear normal . Mental status o Mood, affect appropriate  I have personally reviewed following labs and imaging studies:  Potassium 3.3  Calcium 5.9  Phosphorus 1.7  Magnesium 1.5  Hemoglobin 9.8  EKG sinus rhythm, prolonged QT  Scheduled Meds: . calcium carbonate  1 tablet Oral Q breakfast  . calcium gluconate  1 g Intravenous Once  . enoxaparin (LOVENOX) injection  40 mg Subcutaneous Q24H  . folic acid  1 mg Oral Daily  . gabapentin  300 mg Oral TID  . lactulose  10 g Oral Daily  . mometasone-formoterol  2 puff Inhalation BID  . oxyCODONE  10 mg Oral Q12H  . pantoprazole  40 mg Oral Daily  . phosphorus  500 mg Oral Daily  . polyethylene glycol  17 g Oral Daily  . potassium chloride SA  20 mEq Oral Daily  . sodium chloride flush  3 mL Intravenous Q12H   Continuous Infusions:   Active Problems:   COPD (chronic obstructive pulmonary disease) (HCC)   Bone metastasis (HCC)   Essential (primary) hypertension   Cancer of lung (HCC)   Malignant neoplasm of overlapping sites of unspecified bronchus and lung (HCC)   Nausea & vomiting   Headache    Prolonged QT interval   Dehydration   Time spent 20 minutes

## 2015-09-24 ENCOUNTER — Ambulatory Visit: Payer: PPO

## 2015-09-24 DIAGNOSIS — J449 Chronic obstructive pulmonary disease, unspecified: Secondary | ICD-10-CM

## 2015-09-24 DIAGNOSIS — E860 Dehydration: Secondary | ICD-10-CM | POA: Diagnosis not present

## 2015-09-24 DIAGNOSIS — I10 Essential (primary) hypertension: Secondary | ICD-10-CM | POA: Diagnosis not present

## 2015-09-24 DIAGNOSIS — I4581 Long QT syndrome: Secondary | ICD-10-CM

## 2015-09-24 DIAGNOSIS — A084 Viral intestinal infection, unspecified: Principal | ICD-10-CM

## 2015-09-24 DIAGNOSIS — C7951 Secondary malignant neoplasm of bone: Secondary | ICD-10-CM | POA: Diagnosis not present

## 2015-09-24 LAB — COMPREHENSIVE METABOLIC PANEL
ALT: 14 U/L (ref 14–54)
ANION GAP: 5 (ref 5–15)
AST: 18 U/L (ref 15–41)
Albumin: 3.3 g/dL — ABNORMAL LOW (ref 3.5–5.0)
Alkaline Phosphatase: 78 U/L (ref 38–126)
BILIRUBIN TOTAL: 0.2 mg/dL — AB (ref 0.3–1.2)
BUN: 5 mg/dL — AB (ref 6–20)
CHLORIDE: 110 mmol/L (ref 101–111)
CO2: 25 mmol/L (ref 22–32)
Calcium: 7.6 mg/dL — ABNORMAL LOW (ref 8.9–10.3)
Creatinine, Ser: 0.4 mg/dL — ABNORMAL LOW (ref 0.44–1.00)
Glucose, Bld: 92 mg/dL (ref 65–99)
POTASSIUM: 3.8 mmol/L (ref 3.5–5.1)
Sodium: 140 mmol/L (ref 135–145)
TOTAL PROTEIN: 6.1 g/dL — AB (ref 6.5–8.1)

## 2015-09-24 LAB — PHOSPHORUS: PHOSPHORUS: 2.2 mg/dL — AB (ref 2.5–4.6)

## 2015-09-24 LAB — MAGNESIUM: MAGNESIUM: 1.7 mg/dL (ref 1.7–2.4)

## 2015-09-24 MED ORDER — HEPARIN SOD (PORK) LOCK FLUSH 100 UNIT/ML IV SOLN
500.0000 [IU] | INTRAVENOUS | Status: AC | PRN
  Administered 2015-09-24: 500 [IU]

## 2015-09-24 NOTE — Discharge Summary (Signed)
Physician Discharge Summary  [name] IRW:[id number] DOB: [birth year] DOA: 09/21/2015  PCP: Myrlene Broker, MD  Admit date: 09/21/2015 Discharge date: 09/24/2015  Admitted From: Home Disposition:  Home  Recommendations for Outpatient Follow-up:  1. Follow up with oncologist in 1-2 weeks 2. Please obtain BMP/CBC in one week   Home Health:none Equipment/Devices:none  Discharge Condition:stable CODE STATUS:full Diet recommendation: Heart Healthy  Brief/Interim Summary: 65 year old female with past medical history of metastatic adenocarcinoma of the lung on chemotherapy with her last chemotherapy 5 days prior to admission that comes in a 1 day history of nausea and vomiting and if  Discharge Diagnoses:  Nausea and vomiting due to viral gastroenteritis: CT scan of the abdomen and pelvis was unremarkable, her CT of the head did not show any cranial findings. She was treated conservatively with IV fluids Imitrex her headache and her pain improved by the next day. She was able to tolerate her diet.  Dehydration: Resolved with IV fluid likely due to above.  Migraine headaches: Onset on admission she was treated with Imitrex which improved. Does resolve.  COPD Stable.  Anemia secondary to chemotherapy: Remained stable.  Prolonged QT: Likely due to metabolic derangement to sulfate repletion.  Metastatic adenocarcinoma of the lung: Continue follow-up with oncologist as an outpatient.  Discharge Instructions  Discharge Instructions    Call MD for:  temperature >100.4    Complete by:  As directed   Diet - low sodium heart healthy    Complete by:  As directed   Increase activity slowly    Complete by:  As directed       Medication List    TAKE these medications   aspirin 81 MG tablet Take 81 mg by mouth daily.   budesonide-formoterol 160-4.5 MCG/ACT inhaler Commonly known as:  SYMBICORT Inhale 2 puffs into the lungs 2 (two) times daily as needed (SOB, wheezing).    calcium-vitamin D 250-125 MG-UNIT tablet Commonly known as:  OSCAL Take 1 tablet by mouth daily.   fish oil-omega-3 fatty acids 1000 MG capsule Take 1 g by mouth daily.   folic acid 1 MG tablet Commonly known as:  FOLVITE Take 1 mg by mouth daily.   gabapentin 300 MG capsule Commonly known as:  NEURONTIN Take 1 capsule (300 mg total) by mouth 3 (three) times daily.   KLOR-CON M20 20 MEQ tablet Generic drug:  potassium chloride SA TAKE 1 TABLET BY MOUTH DAILY What changed:  See the new instructions.   lactulose 10 GM/15ML solution Commonly known as:  CHRONULAC Take 10 g by mouth daily. Reported on 07/22/2015   lidocaine-prilocaine cream Commonly known as:  EMLA Apply 1 application topically daily as needed (pain).   omeprazole 40 MG capsule Commonly known as:  PRILOSEC Take 40 mg by mouth 2 (two) times daily.   OxyCODONE HCl (Abuse Deter) 5 MG Taba Commonly known as:  OXAYDO Take 5 mg by mouth every 4 (four) hours as needed (pain). What changed:  Another medication with the same name was changed. Make sure you understand how and when to take each.   oxyCODONE 10 mg 12 hr tablet Commonly known as:  OXYCONTIN Take 1 tablet (10 mg total) by mouth every 12 (twelve) hours. What changed:  when to take this  reasons to take this   polyethylene glycol powder powder Commonly known as:  GLYCOLAX/MIRALAX Take 17 g by mouth once. Dissolve 1 capful of powder into any liquid and take once daily. Can increase to 2 times daily  if no effect after 3-4 days. What changed:  when to take this  additional instructions   PROAIR RESPICLICK 176 (90 Base) MCG/ACT Aepb Generic drug:  Albuterol Sulfate Inhale 2 puffs into the lungs twice daily as needed for shortness of breath or wheezing   prochlorperazine 10 MG tablet Commonly known as:  COMPAZINE Take 10 mg by mouth every 6 (six) hours as needed for nausea or vomiting.   senna-docusate 8.6-50 MG tablet Commonly known as:   Senokot-S Take 1 tablet by mouth daily.   SUMAtriptan 100 MG tablet Commonly known as:  IMITREX Take 100 mg by mouth as needed for migraine or headache. Reported on 04/01/2015       Allergies  Allergen Reactions  . Demerol Other (See Comments)    Syncope  . Mefoxin [Cefoxitin Sodium In Dextrose]     rash  . Topamax Other (See Comments)    Had trouble walking  . Avelox [Moxifloxacin Hcl In Nacl]     Hypotension.    Consultations:  Oncology   Procedures/Studies: Dg Chest 2 View  Result Date: 09/21/2015 CLINICAL DATA:  Fever and nausea EXAM: CHEST  2 VIEW COMPARISON:  08/12/2015 FINDINGS: Cardiac shadow is stable. Right chest wall port is again seen and stable patchy changes consistent with multifocal neoplasm slightly improved from the prior exam. No bony abnormality is noted. No new focal infiltrate is seen. Multiple areas of bony sclerosis are noted consistent with metastatic disease. IMPRESSION: Slight improvement in the appearance of multifocal disease. Electronically Signed   By: Inez Catalina M.D.   On: 09/21/2015 14:41   Ct Head Wo Contrast  Result Date: 09/21/2015 CLINICAL DATA:  Sinus drainage and nosebleeds. Headache with vomiting. History of lung carcinoma EXAM: CT HEAD WITHOUT CONTRAST CT MAXILLOFACIAL WITHOUT CONTRAST TECHNIQUE: Multidetector CT imaging of the head and maxillofacial structures were performed using the standard protocol without intravenous contrast. Multiplanar CT image reconstructions of the maxillofacial structures were also generated. COMPARISON:  Head CT November 24, 2014 FINDINGS: CT HEAD FINDINGS There is stable age related volume loss. The patient has had a previous parietal craniotomy with encephalomalacia in the inferomedial right parietal lobe, stable. There is no intracranial mass hemorrhage, extra-axial fluid collection, or midline shift there is small vessel disease in the posterior superior right corona radiata, stable. There is no new  gray-white compartment lesion. No acute infarct is evident. As noted above, there is evidence of previous high right parietal craniotomy. Multiple sclerotic lesions are noted in the bony calvarium, concerning for sclerotic metastases given the history of lung carcinoma. Mastoid air cells are clear. There is no hyperdense vessel or appreciable arterial vascular calcification. CT MAXILLOFACIAL FINDINGS There is no fracture or dislocation. There are sclerotic bony metastases at C2, C4, and in right C1 lamina. There is also a sclerotic lesion anterior to the odontoid in the anterior arch of C1. Sclerotic bony metastases are noted in each sphenoid wing. Frontal sinuses are virtually aplastic. There is mucosal thickening in several ethmoid air cells bilaterally. Other paranasal sinuses are clear. No air-fluid level. No bony destruction or expansion. Ostiomeatal unit complexes are patent bilaterally. Nares are patent bilaterally. There is slight leftward deviation of nasal septum. No intraorbital lesions are identified. Orbits appear symmetric bilaterally. No adenopathy is evident. The salivary glands appear symmetric bilaterally. Visualized pharynx and tongue base regions appear unremarkable. IMPRESSION: CT head: Previous craniotomy on the right superiorly with encephalomalacia in the inferomedial right parietal lobe, stable. There is small vessel disease in the  high right corona radiata. No acute infarct. No mass, hemorrhage, or extra-axial fluid collection. Scattered sclerotic foci in the bony calvarium raise concern for metastatic disease given history of lung carcinoma. CT maxillofacial: Extensive sclerotic bony metastases. Mucosal thickening in several ethmoid air cells. Frontal sinus is nearly aplastic. No air-fluid level. No bony destruction or expansion. No intraorbital lesions. Ostiomeatal complexes are patent bilaterally. No intraorbital lesions evident. Electronically Signed   By: Lowella Grip III M.D.    On: 09/21/2015 17:06   Ct Abdomen Pelvis W Contrast  Result Date: 09/21/2015 CLINICAL DATA:  Generalized abdominal pain with nausea and vomiting for 3 months. Bilateral lung carcinoma. EXAM: CT ABDOMEN AND PELVIS WITH CONTRAST TECHNIQUE: Multidetector CT imaging of the abdomen and pelvis was performed using the standard protocol following bolus administration of intravenous contrast. CONTRAST:  144m ISOVUE-300 IOPAMIDOL (ISOVUE-300) INJECTION 61% COMPARISON:  PET-CT on 09/11/2015 and AP CT on 06/27/2015 FINDINGS: Lower chest: Irregular masses in the lingula and left lower lobe appears stable. Ill-defined airspace opacity in the central right lower lobe is also unchanged. Hepatobiliary: No masses or other significant abnormality. Prior cholecystectomy again noted. Mild biliary ductal dilatation remains stable. Pancreas: No mass, inflammatory changes, or other significant abnormality. Spleen: Within normal limits in size and appearance. Adrenals/Urinary Tract: No masses identified. No evidence of hydronephrosis. Stomach/Bowel: Small hiatal hernia again noted. No evidence of obstruction, inflammatory process, or abnormal fluid collections. Vascular/Lymphatic: No pathologically enlarged lymph nodes. No evidence of abdominal aortic aneurysm. Aortic atherosclerosis noted. Reproductive: Prior hysterectomy noted. Adnexal regions are unremarkable in appearance. Other: Small bilateral inguinal hernias containing only fat are unchanged. Musculoskeletal: Diffuse sclerotic bone metastases show no significant change in appearance. IMPRESSION: No evidence of soft tissue metastatic disease or other acute findings within the abdomen or pelvis. Stable small hiatal hernia. Stable diffuse sclerotic bone metastases. Aortic atherosclerosis noted. Stable left lower lung masses and ill-defined airspace opacity in central right lower lobe. Electronically Signed   By: JEarle GellM.D.   On: 09/21/2015 17:16   Nm Pet Image Restag (ps)  Skull Base To Thigh  Result Date: 09/11/2015 CLINICAL DATA:  Subsequent treatment strategy for lung cancer. EXAM: NUCLEAR MEDICINE PET SKULL BASE TO THIGH TECHNIQUE: 6.2 mCi F-18 FDG was injected intravenously. Full-ring PET imaging was performed from the skull base to thigh after the radiotracer. CT data was obtained and used for attenuation correction and anatomic localization. FASTING BLOOD GLUCOSE:  Value: 82 mg/dl COMPARISON:  07/20/2015 FINDINGS: NECK Previously seen 1 cm right-sided level 2 lymph node (image 27 series 4) remains hypermetabolic with SUV max = 6.1 today compared to 5.7 previously. CHEST Multiple pulmonary nodules and masses are again seen bilaterally. Index lesion in the inferior segment of the lingula measures 3.4 x 1.9 cm today compared to 3.6 x 2.4 cm previously. SUV max = 3.7 today compared to 3.6 previously. Previously measured pleural soft tissue in the medial aspect of the left lower hemi thorax is difficult to reproducibly measure, but is about 4.2 x 4.3 cm today compared to 4.1 x 4.0 cm previously. SUV max = 4.6 today compared to 3.8-5.3 previously. Left hilar hypermetabolism demonstrates SUV max = 9.3 today compared to 7.8 previously. Subcarinal and prevascular hypermetabolic lymph nodes persist. The index prevascular node demonstrates SUV max = 5.6 today compared to 6.9 previously and is stable at 11 mm. Numerous other pulmonary nodules are seen scattered through both lungs. Heart size is upper normal. Coronary artery calcification is evident. Right Port-A-Cath tip is positioned  at the distal SVC level. ABDOMEN/PELVIS No abnormal hypermetabolic activity within the liver, pancreas, adrenal glands, or spleen. No hypermetabolic lymph nodes in the abdomen or pelvis. Small to moderate hiatal hernia. Gallbladder surgically absent. Abdominal aortic atherosclerosis without aneurysm. Bilateral groin hernias contain only fat. Uterus is surgically absent. SKELETON Widespread bony metastatic  disease is again noted while much of the bony involvement appears relatively stable, there is a lesion in the anterior left iliac crest this shows interval decrease in hypermetabolism with SUV max = 5 today compared 8.3 previously. Other scattered areas of spinal involvement show decrease in hypermetabolism. The L2 vertebral body lesion demonstrates SUV max = 6.5 today compared to 8.0 previously it appears less confluent. IMPRESSION: 1. No generalized trend of worsening or improving disease. Much of the hypermetabolic disease is relatively stable on CT and PET imaging although an index prevascular lymph node shows slight decrease in hypermetabolism and scattered bony deposits also show slight interval decrease in FDG uptake. Electronically Signed   By: Misty Stanley M.D.   On: 09/11/2015 14:04   Ct Maxillofacial Wo Contrast  Result Date: 09/21/2015 CLINICAL DATA:  Sinus drainage and nosebleeds. Headache with vomiting. History of lung carcinoma EXAM: CT HEAD WITHOUT CONTRAST CT MAXILLOFACIAL WITHOUT CONTRAST TECHNIQUE: Multidetector CT imaging of the head and maxillofacial structures were performed using the standard protocol without intravenous contrast. Multiplanar CT image reconstructions of the maxillofacial structures were also generated. COMPARISON:  Head CT November 24, 2014 FINDINGS: CT HEAD FINDINGS There is stable age related volume loss. The patient has had a previous parietal craniotomy with encephalomalacia in the inferomedial right parietal lobe, stable. There is no intracranial mass hemorrhage, extra-axial fluid collection, or midline shift there is small vessel disease in the posterior superior right corona radiata, stable. There is no new gray-white compartment lesion. No acute infarct is evident. As noted above, there is evidence of previous high right parietal craniotomy. Multiple sclerotic lesions are noted in the bony calvarium, concerning for sclerotic metastases given the history of lung  carcinoma. Mastoid air cells are clear. There is no hyperdense vessel or appreciable arterial vascular calcification. CT MAXILLOFACIAL FINDINGS There is no fracture or dislocation. There are sclerotic bony metastases at C2, C4, and in right C1 lamina. There is also a sclerotic lesion anterior to the odontoid in the anterior arch of C1. Sclerotic bony metastases are noted in each sphenoid wing. Frontal sinuses are virtually aplastic. There is mucosal thickening in several ethmoid air cells bilaterally. Other paranasal sinuses are clear. No air-fluid level. No bony destruction or expansion. Ostiomeatal unit complexes are patent bilaterally. Nares are patent bilaterally. There is slight leftward deviation of nasal septum. No intraorbital lesions are identified. Orbits appear symmetric bilaterally. No adenopathy is evident. The salivary glands appear symmetric bilaterally. Visualized pharynx and tongue base regions appear unremarkable. IMPRESSION: CT head: Previous craniotomy on the right superiorly with encephalomalacia in the inferomedial right parietal lobe, stable. There is small vessel disease in the high right corona radiata. No acute infarct. No mass, hemorrhage, or extra-axial fluid collection. Scattered sclerotic foci in the bony calvarium raise concern for metastatic disease given history of lung carcinoma. CT maxillofacial: Extensive sclerotic bony metastases. Mucosal thickening in several ethmoid air cells. Frontal sinus is nearly aplastic. No air-fluid level. No bony destruction or expansion. No intraorbital lesions. Ostiomeatal complexes are patent bilaterally. No intraorbital lesions evident. Electronically Signed   By: Lowella Grip III M.D.   On: 09/21/2015 17:06      Subjective:  Discharge Exam: Vitals:   09/23/15 2223 09/24/15 0604  BP: (!) 142/85 120/74  Pulse: 84 84  Resp: 18 18  Temp: 98 F (36.7 C) 97.7 F (36.5 C)   Vitals:   09/23/15 0555 09/23/15 1611 09/23/15 2223  09/24/15 0604  BP: 127/86 (!) 129/93 (!) 142/85 120/74  Pulse: 80 79 84 84  Resp: '16 18 18 18  '$ Temp: 97.5 F (36.4 C) 97.7 F (36.5 C) 98 F (36.7 C) 97.7 F (36.5 C)  TempSrc: Oral Oral Oral Oral  SpO2: 99% 98% 98% 99%  Weight:      Height:        General: Pt is alert, awake, not in acute distress Cardiovascular: RRR, S1/S2 +, no rubs, no gallops Respiratory: CTA bilaterally, no wheezing, no rhonchi Abdominal: Soft, NT, ND, bowel sounds + Extremities: no edema, no cyanosis    The results of significant diagnostics from this hospitalization (including imaging, microbiology, ancillary and laboratory) are listed below for reference.     Microbiology: Recent Results (from the past 240 hour(s))  Culture, blood (Routine X 2)     Status: None (Preliminary result)   Collection Time: 09/21/15  2:19 PM  Result Value Ref Range Status   Specimen Description BLOOD RIGHT ANTECUBITAL  Final   Special Requests BOTTLES DRAWN AEROBIC AND ANAEROBIC 10CC EACH  Final   Culture   Final    NO GROWTH 2 DAYS Performed at [facility]    Report Status PENDING  Incomplete  Culture, blood (Routine X 2)     Status: None (Preliminary result)   Collection Time: 09/21/15  3:20 PM  Result Value Ref Range Status   Specimen Description BLOOD LEFT ARM  Final   Special Requests BOTTLES DRAWN AEROBIC AND ANAEROBIC 10CC EACH  Final   Culture   Final    NO GROWTH 2 DAYS Performed at [facility]    Report Status PENDING  Incomplete  Urine culture     Status: Abnormal   Collection Time: 09/21/15  3:55 PM  Result Value Ref Range Status   Specimen Description URINE, RANDOM  Final   Special Requests NONE  Final   Culture MULTIPLE SPECIES PRESENT, SUGGEST RECOLLECTION (A)  Final   Report Status 09/22/2015 FINAL  Final     Labs: BNP (last 3 results) No results for input(s): BNP in the last 8760 hours. Basic Metabolic Panel:  Recent Labs Lab 09/21/15 1420 09/22/15 0558 09/23/15 0329  09/23/15 1415 09/24/15 0520  NA 135 134* 139 139 140  K 3.8 3.3* 3.5 3.5 3.8  CL 106 109 111 108 110  CO2 21* 19* '25 27 25  '$ GLUCOSE 107* 78 88 132* 92  BUN 11 <5* <5* <5* 5*  CREATININE 0.39* 0.36* <0.30* 0.42* 0.40*  CALCIUM 7.3* 5.9* 6.4* 7.5* 7.6*  MG  --  1.5* 2.2 1.9 1.7  PHOS  --  1.7* 1.6* 2.0* 2.2*   Liver Function Tests:  Recent Labs Lab 09/21/15 1420 09/22/15 0558 09/24/15 0520  AST '21 19 18  '$ ALT 12* 11* 14  ALKPHOS 93 81 78  BILITOT 0.7 0.7 0.2*  PROT 7.0 5.5* 6.1*  ALBUMIN 3.6 3.0* 3.3*    Recent Labs Lab 09/21/15 1420  LIPASE 24   No results for input(s): AMMONIA in the last 168 hours. CBC:  Recent Labs Lab 09/21/15 1420 09/22/15 0558  WBC 4.4 2.6*  NEUTROABS 3.8  --   HGB 11.7* 9.8*  HCT 36.1 31.0*  MCV 85.3 86.4  PLT 229 187   Cardiac Enzymes: No results for input(s): CKTOTAL, CKMB, CKMBINDEX, TROPONINI in the last 168 hours. BNP: Invalid input(s): POCBNP CBG: No results for input(s): GLUCAP in the last 168 hours. D-Dimer No results for input(s): DDIMER in the last 72 hours. Hgb A1c No results for input(s): HGBA1C in the last 72 hours. Lipid Profile No results for input(s): CHOL, HDL, LDLCALC, TRIG, CHOLHDL, LDLDIRECT in the last 72 hours. Thyroid function studies  Recent Labs  09/22/15 0558  TSH 1.466   Anemia work up No results for input(s): VITAMINB12, FOLATE, FERRITIN, TIBC, IRON, RETICCTPCT in the last 72 hours. Urinalysis    Component Value Date/Time   COLORURINE YELLOW 09/21/2015 [place] 09/21/2015 1555   LABSPEC 1.025 09/21/2015 1555   LABSPEC 1.020 06/03/2015 1451   PHURINE 6.5 09/21/2015 1555   GLUCOSEU NEGATIVE 09/21/2015 1555   HGBUR NEGATIVE 09/21/2015 1555   BILIRUBINUR SMALL (A) 09/21/2015 1555   KETONESUR >80 (A) 09/21/2015 1555   PROTEINUR NEGATIVE 09/21/2015 1555   UROBILINOGEN 0.2 06/03/2015 1451   NITRITE NEGATIVE 09/21/2015 1555   LEUKOCYTESUR NEGATIVE 09/21/2015 1555   Sepsis  Labs Invalid input(s): PROCALCITONIN,  WBC,  LACTICIDVEN Microbiology Recent Results (from the past 240 hour(s))  Culture, blood (Routine X 2)     Status: None (Preliminary result)   Collection Time: 09/21/15  2:19 PM  Result Value Ref Range Status   Specimen Description BLOOD RIGHT ANTECUBITAL  Final   Special Requests BOTTLES DRAWN AEROBIC AND ANAEROBIC 10CC EACH  Final   Culture   Final    NO GROWTH 2 DAYS Performed at [facility]    Report Status PENDING  Incomplete  Culture, blood (Routine X 2)     Status: None (Preliminary result)   Collection Time: 09/21/15  3:20 PM  Result Value Ref Range Status   Specimen Description BLOOD LEFT ARM  Final   Special Requests BOTTLES DRAWN AEROBIC AND ANAEROBIC 10CC EACH  Final   Culture   Final    NO GROWTH 2 DAYS Performed at [facility]    Report Status PENDING  Incomplete  Urine culture     Status: Abnormal   Collection Time: 09/21/15  3:55 PM  Result Value Ref Range Status   Specimen Description URINE, RANDOM  Final   Special Requests NONE  Final   Culture MULTIPLE SPECIES PRESENT, SUGGEST RECOLLECTION (A)  Final   Report Status 09/22/2015 FINAL  Final     Time coordinating discharge: Over 30 minutes  SIGNED:   Charlynne Cousins, MD  [facility] 09/24/2015, 7:26 AM Pager   If 7PM-7AM, please contact night-coverage [email or web address] Password [email or web address]

## 2015-09-24 NOTE — Progress Notes (Signed)
[name] says she is feeling better. She still has limited a headache. It is not bothering her too much. She says she kept down her meals yesterday.  She's had no diarrhea. She 1 episode of vomiting after getting some pain medication for headache. Maybe the vomiting that she has is from medications.  Her labs look okay. Her potassium is 3.8. Her albumin is 3.3. Her creatinine is 0.4.  She is out of bed. She has had no cough. She's had no bleeding. She's had no rashes.  Her physical exam looks okay. Her vital signs are stable. She is afebrile. Her lungs are clear. Cardiac exam regular rate and rhythm with no murmurs, rubs or bruits. Abdomen is soft. Bowel sounds are present. Bowel sounds might be slightly decreased. There is no guarding or rebound tenderness. Extremities shows no clubbing, cyanosis or edema.  Hopefully, [name] can go home today. It sounds that she has had some count of viral gastroenteritis. Maybe she has little bit of food poisoning. she seems to be doing better. She would like to go home.  I very much appreciate all the great care that she is gotten from the staff upon [address] area and as always, all have done a wonderful job.  Lattie Haw, MD  Oswaldo Milian 43:1

## 2015-09-25 ENCOUNTER — Ambulatory Visit: Payer: PPO | Admitting: Hematology & Oncology

## 2015-09-25 ENCOUNTER — Other Ambulatory Visit: Payer: PPO

## 2015-09-25 ENCOUNTER — Ambulatory Visit: Payer: PPO

## 2015-09-26 LAB — CULTURE, BLOOD (ROUTINE X 2)
CULTURE: NO GROWTH
Culture: NO GROWTH

## 2015-09-30 ENCOUNTER — Ambulatory Visit (HOSPITAL_BASED_OUTPATIENT_CLINIC_OR_DEPARTMENT_OTHER): Payer: PPO

## 2015-09-30 ENCOUNTER — Encounter: Payer: Self-pay | Admitting: Family

## 2015-09-30 ENCOUNTER — Ambulatory Visit (HOSPITAL_BASED_OUTPATIENT_CLINIC_OR_DEPARTMENT_OTHER): Payer: PPO | Admitting: Family

## 2015-09-30 ENCOUNTER — Other Ambulatory Visit (HOSPITAL_BASED_OUTPATIENT_CLINIC_OR_DEPARTMENT_OTHER): Payer: PPO

## 2015-09-30 VITALS — BP 146/90 | HR 89 | Temp 98.0°F | Resp 18 | Ht 61.0 in | Wt 129.0 lb

## 2015-09-30 DIAGNOSIS — C3490 Malignant neoplasm of unspecified part of unspecified bronchus or lung: Secondary | ICD-10-CM | POA: Diagnosis not present

## 2015-09-30 DIAGNOSIS — C7800 Secondary malignant neoplasm of unspecified lung: Secondary | ICD-10-CM

## 2015-09-30 DIAGNOSIS — C7951 Secondary malignant neoplasm of bone: Secondary | ICD-10-CM

## 2015-09-30 DIAGNOSIS — M436 Torticollis: Secondary | ICD-10-CM

## 2015-09-30 DIAGNOSIS — Z452 Encounter for adjustment and management of vascular access device: Secondary | ICD-10-CM | POA: Diagnosis not present

## 2015-09-30 DIAGNOSIS — C3492 Malignant neoplasm of unspecified part of left bronchus or lung: Secondary | ICD-10-CM

## 2015-09-30 DIAGNOSIS — C779 Secondary and unspecified malignant neoplasm of lymph node, unspecified: Secondary | ICD-10-CM | POA: Diagnosis not present

## 2015-09-30 DIAGNOSIS — C3491 Malignant neoplasm of unspecified part of right bronchus or lung: Secondary | ICD-10-CM

## 2015-09-30 DIAGNOSIS — J910 Malignant pleural effusion: Secondary | ICD-10-CM

## 2015-09-30 LAB — URINALYSIS, MICROSCOPIC (CHCC SATELLITE)
BILIRUBIN (URINE): NEGATIVE
Blood: NEGATIVE
Glucose: NEGATIVE mg/dL
KETONES: NEGATIVE mg/dL
Leukocyte Esterase: NEGATIVE
Nitrite: NEGATIVE
Protein: NEGATIVE mg/dL
RBC: NEGATIVE (ref 0–2)
Specific Gravity, Urine: 1.015 (ref 1.003–1.035)
Urobilinogen, UR: 0.2 mg/dL (ref 0.2–1)
WBC: NEGATIVE (ref 0–2)
pH: 6 (ref 4.60–8.00)

## 2015-09-30 LAB — CMP (CANCER CENTER ONLY)
ALT(SGPT): 18 U/L (ref 10–47)
AST: 23 U/L (ref 11–38)
Albumin: 3.4 g/dL (ref 3.3–5.5)
Alkaline Phosphatase: 87 U/L — ABNORMAL HIGH (ref 26–84)
BILIRUBIN TOTAL: 0.6 mg/dL (ref 0.20–1.60)
BUN: 12 mg/dL (ref 7–22)
CO2: 25 meq/L (ref 18–33)
CREATININE: 0.5 mg/dL — AB (ref 0.6–1.2)
Calcium: 8.8 mg/dL (ref 8.0–10.3)
Chloride: 106 mEq/L (ref 98–108)
GLUCOSE: 84 mg/dL (ref 73–118)
Potassium: 4.4 mEq/L (ref 3.3–4.7)
SODIUM: 136 meq/L (ref 128–145)
Total Protein: 7 g/dL (ref 6.4–8.1)

## 2015-09-30 LAB — CBC WITH DIFFERENTIAL (CANCER CENTER ONLY)
BASO#: 0.1 10*3/uL (ref 0.0–0.2)
BASO%: 1.2 % (ref 0.0–2.0)
EOS%: 3 % (ref 0.0–7.0)
Eosinophils Absolute: 0.2 10*3/uL (ref 0.0–0.5)
HCT: 35.7 % (ref 34.8–46.6)
HGB: 11.5 g/dL — ABNORMAL LOW (ref 11.6–15.9)
LYMPH#: 2 10*3/uL (ref 0.9–3.3)
LYMPH%: 31.5 % (ref 14.0–48.0)
MCH: 27.4 pg (ref 26.0–34.0)
MCHC: 32.2 g/dL (ref 32.0–36.0)
MCV: 85 fL (ref 81–101)
MONO#: 0.9 10*3/uL (ref 0.1–0.9)
MONO%: 13.9 % — AB (ref 0.0–13.0)
NEUT#: 3.2 10*3/uL (ref 1.5–6.5)
NEUT%: 50.4 % (ref 39.6–80.0)
PLATELETS: 276 10*3/uL (ref 145–400)
RBC: 4.19 10*6/uL (ref 3.70–5.32)
RDW: 18.7 % — AB (ref 11.1–15.7)
WBC: 6.4 10*3/uL (ref 3.9–10.0)

## 2015-09-30 LAB — LACTATE DEHYDROGENASE: LDH: 202 U/L (ref 125–245)

## 2015-09-30 MED ORDER — SODIUM CHLORIDE 0.9% FLUSH
10.0000 mL | INTRAVENOUS | Status: DC | PRN
  Administered 2015-09-30: 10 mL via INTRAVENOUS
  Filled 2015-09-30: qty 10

## 2015-09-30 MED ORDER — KETOROLAC TROMETHAMINE 15 MG/ML IJ SOLN
30.0000 mg | Freq: Once | INTRAMUSCULAR | Status: AC
  Administered 2015-09-30: 30 mg via INTRAVENOUS
  Filled 2015-09-30: qty 1

## 2015-09-30 MED ORDER — HEPARIN SOD (PORK) LOCK FLUSH 100 UNIT/ML IV SOLN
500.0000 [IU] | Freq: Once | INTRAVENOUS | Status: AC
  Administered 2015-09-30: 500 [IU] via INTRAVENOUS
  Filled 2015-09-30: qty 5

## 2015-09-30 MED ORDER — CYCLOBENZAPRINE HCL 5 MG PO TABS: 5.0000 mg | ORAL_TABLET | Freq: Three times a day (TID) | ORAL | 0 refills | Status: DC | PRN

## 2015-09-30 MED ORDER — SODIUM CHLORIDE 0.9 % IV SOLN
Freq: Once | INTRAVENOUS | Status: AC
  Administered 2015-09-30: 11:00:00 via INTRAVENOUS

## 2015-09-30 MED ORDER — KETOROLAC TROMETHAMINE 15 MG/ML IJ SOLN
INTRAMUSCULAR | Status: AC
  Filled 2015-09-30: qty 2

## 2015-09-30 NOTE — Patient Instructions (Signed)

## 2015-09-30 NOTE — Progress Notes (Signed)
Hematology and Oncology Follow Up Visit  [name] [id number] 27-May-1950 65 y.o. 09/30/2015   Principle Diagnosis:  Metastatic adenocarcinoma the lung - bone metastases, lymph node metastasis and intrapulmonary metastasis Malignant left pleural effusion  Current Therapy:   AbraxaneAvastin - s/p cycle 3 Navelbine/Avastin q 2 wk dosing - s/p cycle 4 - progression Tecentriq s/p cycle 4 Xgeva 120 mg subcutaneous every month Palliative radiation therapy to the neck.    Interim History:  [name] is here today with her husband for a follow-up and cycle 4 of treatment. She had an episode of gastroenteritis possibly from food poisoning last week after eating at a buffet for her birthday. She had severe n/v and became dehydrated. She was treated in the hospital with IV fluids and Imitrex for a headache. She is now home but still feeling fatigued and weak. She states that she "aches all over like the flu." Her neck is "tight." She is still having sinus drainage and unfortunately had to miss her appointment with ENT due to being in the hospital. She plans to reschedule this.  No fever, chills, cough, rash, dizziness, vision changes, sensitivity to light, chest pain, palpitations, abdominal pain or changes in bowel or bladder habits.  Her SOB with exertion is unchanged. She will take time to rest until it resolves.  No swelling, numbness or tingling in her extremities. No lymphadenopathy found on exam.  She has maintained a good appetite but fells that she should be drinking more fluids. Her weight is stable.   Medications:    Medication List       Accurate as of 09/30/15 10:35 AM. Always use your most recent med list.          aspirin 81 MG tablet Take 81 mg by mouth daily.   budesonide-formoterol 160-4.5 MCG/ACT inhaler Commonly known as:  SYMBICORT Inhale 2 puffs into the lungs 2 (two) times daily as needed (SOB, wheezing).   calcium-vitamin D 250-125 MG-UNIT tablet Commonly  known as:  OSCAL Take 1 tablet by mouth daily.   fish oil-omega-3 fatty acids 1000 MG capsule Take 1 g by mouth daily.   folic acid 1 MG tablet Commonly known as:  FOLVITE Take 1 mg by mouth daily.   gabapentin 300 MG capsule Commonly known as:  NEURONTIN Take 1 capsule (300 mg total) by mouth 3 (three) times daily.   KLOR-CON M20 20 MEQ tablet Generic drug:  potassium chloride SA TAKE 1 TABLET BY MOUTH DAILY   lactulose 10 GM/15ML solution Commonly known as:  CHRONULAC Take 10 g by mouth daily. Reported on 07/22/2015   lidocaine-prilocaine cream Commonly known as:  EMLA Apply 1 application topically daily as needed (pain).   omeprazole 40 MG capsule Commonly known as:  PRILOSEC Take 40 mg by mouth 2 (two) times daily.   OxyCODONE HCl (Abuse Deter) 5 MG Taba Commonly known as:  OXAYDO Take 5 mg by mouth every 4 (four) hours as needed (pain).   oxyCODONE 10 mg 12 hr tablet Commonly known as:  OXYCONTIN Take 1 tablet (10 mg total) by mouth every 12 (twelve) hours.   polyethylene glycol powder powder Commonly known as:  GLYCOLAX/MIRALAX Take 17 g by mouth once. Dissolve 1 capful of powder into any liquid and take once daily. Can increase to 2 times daily if no effect after 3-4 days.   PROAIR RESPICLICK 160 (90 Base) MCG/ACT Aepb Generic drug:  Albuterol Sulfate Inhale 2 puffs into the lungs twice daily as needed for  shortness of breath or wheezing   prochlorperazine 10 MG tablet Commonly known as:  COMPAZINE Take 10 mg by mouth every 6 (six) hours as needed for nausea or vomiting.   senna-docusate 8.6-50 MG tablet Commonly known as:  Senokot-S Take 1 tablet by mouth daily.   SUMAtriptan 100 MG tablet Commonly known as:  IMITREX Take 100 mg by mouth as needed for migraine or headache. Reported on 04/01/2015       Allergies:  Allergies  Allergen Reactions  . Demerol Other (See Comments)    Syncope  . Mefoxin [Cefoxitin Sodium In Dextrose]     rash  .  Topamax Other (See Comments)    Had trouble walking  . Avelox [Moxifloxacin Hcl In Nacl]     Hypotension.    Past Medical History, Surgical history, Social history, and Family History were reviewed and updated.  Review of Systems: All other 10 point review of systems is negative.   Physical Exam:  height is '5\' 1"'$  (1.549 m) and weight is 129 lb (58.5 kg). Her oral temperature is 98 F (36.7 C). Her blood pressure is 146/90 (abnormal) and her pulse is 89. Her respiration is 18.   Wt Readings from Last 3 Encounters:  09/30/15 129 lb (58.5 kg)  09/21/15 128 lb (58.1 kg)  09/03/15 128 lb (58.1 kg)    Ocular: Sclerae unicteric, pupils equal, round and reactive to light Ear-nose-throat: Oropharynx clear, dentition fair Lymphatic: No cervical supraclavicular or axillary adenopathy Lungs no rales or rhonchi, good excursion bilaterally Heart regular rate and rhythm, no murmur appreciated Abd soft, some mild tenderness in her right upper quadrant, positive bowel sounds, no liver or spleen tip palpated on exam, no fluid wave MSK no focal spinal tenderness, no joint edema Neuro: non-focal, well-oriented, appropriate affect Breasts: Deferred   Lab Results  Component Value Date   WBC 6.4 09/30/2015   HGB 11.5 (L) 09/30/2015   HCT 35.7 09/30/2015   MCV 85 09/30/2015   PLT 276 09/30/2015   Lab Results  Component Value Date   FERRITIN 47 10/29/2013   IRON 64 10/29/2013   TIBC 233 (L) 10/29/2013   UIBC 169 10/29/2013   IRONPCTSAT 27 10/29/2013   Lab Results  Component Value Date   RETICCTPCT 1.4 08/13/2008   RBC 4.19 09/30/2015   RETICCTABS 61.6 08/13/2008   No results found for: KPAFRELGTCHN, LAMBDASER, KAPLAMBRATIO No results found for: IGGSERUM, IGA, IGMSERUM No results found for: Odetta Pink, SPEI   Chemistry      Component Value Date/Time   NA 140 09/24/2015 0520   NA 136 09/16/2015 1152   NA 140 07/22/2015 1255    K 3.8 09/24/2015 0520   K 3.8 09/16/2015 1152   K 4.0 07/22/2015 1255   CL 110 09/24/2015 0520   CL 106 09/16/2015 1152   CO2 25 09/24/2015 0520   CO2 26 09/16/2015 1152   CO2 27 07/22/2015 1255   BUN 5 (L) 09/24/2015 0520   BUN 14 09/16/2015 1152   BUN 9.8 07/22/2015 1255   CREATININE 0.40 (L) 09/24/2015 0520   CREATININE 0.7 09/16/2015 1152   CREATININE 0.6 07/22/2015 1255      Component Value Date/Time   CALCIUM 7.6 (L) 09/24/2015 0520   CALCIUM 8.6 09/16/2015 1152   CALCIUM 9.1 07/22/2015 1255   ALKPHOS 78 09/24/2015 0520   ALKPHOS 95 (H) 09/16/2015 1152   AST 18 09/24/2015 0520   AST 21 09/16/2015 1152   ALT  14 09/24/2015 0520   ALT 12 09/16/2015 1152   BILITOT 0.2 (L) 09/24/2015 0520   BILITOT 0.60 09/16/2015 1152     Impression and Plan: Ms. Risk is a very pleasant 64 yo white female with metastatic adenocarcinoma of the lung with no "driver" mutations that we can target. She is still recuperating from her bout of gastritis and hospitalization. She is fatigued and feels weak. She is also "achy all over with a stiff neck." We will hold off on treating her today and plan to resume next week. She will get a new schedule today.  We will have her try Flexeril 5 mg TID PRN and see if this helps with the stiffness in her neck.  We got a UA on her today. Results are pending.  We will give her fluids today while she is here as well as Toradol 30 mg IV once for pain.   She will contact our office with any questions or concerns. We can certainly see her sooner if need be.   Eliezer Bottom, NP 8/23/201710:35 AM

## 2015-10-01 ENCOUNTER — Telehealth: Payer: Self-pay

## 2015-10-01 NOTE — Telephone Encounter (Addendum)
Message provided to pt via phone. Pt verbalizes understanding and appreciation. States she feels "some better" after IVF yesterday, although she is still experiencing neck "tightness." Aware to contact office should she need IVF again or for additional concerns. dph  ----- Message from Eliezer Bottom, NP sent at 09/30/2015  4:09 PM EDT ----- Regarding: UA UA is negative!!! Thanks.   Judson Roch

## 2015-10-06 ENCOUNTER — Ambulatory Visit (HOSPITAL_BASED_OUTPATIENT_CLINIC_OR_DEPARTMENT_OTHER): Payer: PPO

## 2015-10-06 ENCOUNTER — Other Ambulatory Visit: Payer: Self-pay | Admitting: Family

## 2015-10-06 DIAGNOSIS — R531 Weakness: Secondary | ICD-10-CM | POA: Diagnosis not present

## 2015-10-06 DIAGNOSIS — M5442 Lumbago with sciatica, left side: Secondary | ICD-10-CM

## 2015-10-06 DIAGNOSIS — C3491 Malignant neoplasm of unspecified part of right bronchus or lung: Secondary | ICD-10-CM

## 2015-10-06 DIAGNOSIS — C7800 Secondary malignant neoplasm of unspecified lung: Secondary | ICD-10-CM

## 2015-10-06 DIAGNOSIS — C7951 Secondary malignant neoplasm of bone: Secondary | ICD-10-CM

## 2015-10-06 DIAGNOSIS — M542 Cervicalgia: Secondary | ICD-10-CM

## 2015-10-06 DIAGNOSIS — C3490 Malignant neoplasm of unspecified part of unspecified bronchus or lung: Secondary | ICD-10-CM

## 2015-10-06 DIAGNOSIS — M5441 Lumbago with sciatica, right side: Secondary | ICD-10-CM

## 2015-10-06 DIAGNOSIS — R5383 Other fatigue: Secondary | ICD-10-CM

## 2015-10-06 DIAGNOSIS — C779 Secondary and unspecified malignant neoplasm of lymph node, unspecified: Secondary | ICD-10-CM

## 2015-10-06 MED ORDER — KETOROLAC TROMETHAMINE 30 MG/ML IJ SOLN
30.0000 mg | Freq: Once | INTRAMUSCULAR | Status: AC
  Administered 2015-10-06: 30 mg via INTRAVENOUS
  Filled 2015-10-06: qty 1

## 2015-10-06 MED ORDER — KETOROLAC TROMETHAMINE 15 MG/ML IJ SOLN
INTRAMUSCULAR | Status: AC
  Filled 2015-10-06: qty 2

## 2015-10-06 MED ORDER — SODIUM CHLORIDE 0.9 % IV SOLN
INTRAVENOUS | Status: DC
  Administered 2015-10-06: 10:00:00 via INTRAVENOUS

## 2015-10-06 NOTE — Patient Instructions (Signed)

## 2015-10-06 NOTE — Progress Notes (Signed)
Toradol

## 2015-10-07 ENCOUNTER — Other Ambulatory Visit (HOSPITAL_BASED_OUTPATIENT_CLINIC_OR_DEPARTMENT_OTHER): Payer: PPO

## 2015-10-07 ENCOUNTER — Encounter: Payer: Self-pay | Admitting: Family

## 2015-10-07 ENCOUNTER — Ambulatory Visit (HOSPITAL_BASED_OUTPATIENT_CLINIC_OR_DEPARTMENT_OTHER): Payer: PPO | Admitting: Family

## 2015-10-07 ENCOUNTER — Ambulatory Visit (HOSPITAL_BASED_OUTPATIENT_CLINIC_OR_DEPARTMENT_OTHER): Payer: PPO

## 2015-10-07 DIAGNOSIS — Z5112 Encounter for antineoplastic immunotherapy: Secondary | ICD-10-CM

## 2015-10-07 DIAGNOSIS — C3491 Malignant neoplasm of unspecified part of right bronchus or lung: Secondary | ICD-10-CM

## 2015-10-07 DIAGNOSIS — J910 Malignant pleural effusion: Secondary | ICD-10-CM

## 2015-10-07 DIAGNOSIS — Z5111 Encounter for antineoplastic chemotherapy: Secondary | ICD-10-CM

## 2015-10-07 DIAGNOSIS — M436 Torticollis: Secondary | ICD-10-CM

## 2015-10-07 DIAGNOSIS — C3490 Malignant neoplasm of unspecified part of unspecified bronchus or lung: Secondary | ICD-10-CM

## 2015-10-07 DIAGNOSIS — C3492 Malignant neoplasm of unspecified part of left bronchus or lung: Secondary | ICD-10-CM

## 2015-10-07 DIAGNOSIS — C342 Malignant neoplasm of middle lobe, bronchus or lung: Secondary | ICD-10-CM

## 2015-10-07 DIAGNOSIS — C7951 Secondary malignant neoplasm of bone: Secondary | ICD-10-CM | POA: Diagnosis not present

## 2015-10-07 LAB — CBC WITH DIFFERENTIAL (CANCER CENTER ONLY)
BASO#: 0.1 10*3/uL (ref 0.0–0.2)
BASO%: 1.5 % (ref 0.0–2.0)
EOS%: 3.7 % (ref 0.0–7.0)
Eosinophils Absolute: 0.3 10*3/uL (ref 0.0–0.5)
HEMATOCRIT: 34.9 % (ref 34.8–46.6)
HEMOGLOBIN: 11.3 g/dL — AB (ref 11.6–15.9)
LYMPH#: 1.8 10*3/uL (ref 0.9–3.3)
LYMPH%: 25.1 % (ref 14.0–48.0)
MCH: 27.8 pg (ref 26.0–34.0)
MCHC: 32.4 g/dL (ref 32.0–36.0)
MCV: 86 fL (ref 81–101)
MONO#: 0.7 10*3/uL (ref 0.1–0.9)
MONO%: 9 % (ref 0.0–13.0)
NEUT%: 60.7 % (ref 39.6–80.0)
NEUTROS ABS: 4.4 10*3/uL (ref 1.5–6.5)
Platelets: 243 10*3/uL (ref 145–400)
RBC: 4.06 10*6/uL (ref 3.70–5.32)
RDW: 18.5 % — ABNORMAL HIGH (ref 11.1–15.7)
WBC: 7.3 10*3/uL (ref 3.9–10.0)

## 2015-10-07 LAB — CMP (CANCER CENTER ONLY)
ALBUMIN: 3.2 g/dL — AB (ref 3.3–5.5)
ALT: 16 U/L (ref 10–47)
AST: 20 U/L (ref 11–38)
Alkaline Phosphatase: 97 U/L — ABNORMAL HIGH (ref 26–84)
BUN, Bld: 11 mg/dL (ref 7–22)
CALCIUM: 8.1 mg/dL (ref 8.0–10.3)
CHLORIDE: 108 meq/L (ref 98–108)
CO2: 25 meq/L (ref 18–33)
Creat: 0.5 mg/dl — ABNORMAL LOW (ref 0.6–1.2)
GLUCOSE: 81 mg/dL (ref 73–118)
Potassium: 3.8 mEq/L (ref 3.3–4.7)
SODIUM: 136 meq/L (ref 128–145)
TOTAL PROTEIN: 7 g/dL (ref 6.4–8.1)
Total Bilirubin: 0.5 mg/dl (ref 0.20–1.60)

## 2015-10-07 LAB — LACTATE DEHYDROGENASE: LDH: 199 U/L (ref 125–245)

## 2015-10-07 MED ORDER — SODIUM CHLORIDE 0.9 % IV SOLN
Freq: Once | INTRAVENOUS | Status: AC
  Administered 2015-10-07: 10:00:00 via INTRAVENOUS

## 2015-10-07 MED ORDER — PROCHLORPERAZINE MALEATE 10 MG PO TABS
ORAL_TABLET | ORAL | Status: AC
  Filled 2015-10-07: qty 1

## 2015-10-07 MED ORDER — PACLITAXEL PROTEIN-BOUND CHEMO INJECTION 100 MG
81.0000 mg/m2 | Freq: Once | INTRAVENOUS | Status: AC
  Administered 2015-10-07: 125 mg via INTRAVENOUS
  Filled 2015-10-07: qty 25

## 2015-10-07 MED ORDER — SODIUM CHLORIDE 0.9 % IV SOLN
10.0000 mg/kg | Freq: Once | INTRAVENOUS | Status: AC
  Administered 2015-10-07: 575 mg via INTRAVENOUS
  Filled 2015-10-07: qty 16

## 2015-10-07 MED ORDER — SODIUM CHLORIDE 0.9 % IJ SOLN
10.0000 mL | INTRAMUSCULAR | Status: DC | PRN
  Administered 2015-10-07: 10 mL via INTRAVENOUS
  Filled 2015-10-07: qty 10

## 2015-10-07 MED ORDER — PROCHLORPERAZINE MALEATE 10 MG PO TABS
10.0000 mg | ORAL_TABLET | Freq: Once | ORAL | Status: AC
  Administered 2015-10-07: 10 mg via ORAL

## 2015-10-07 MED ORDER — HEPARIN SOD (PORK) LOCK FLUSH 100 UNIT/ML IV SOLN
500.0000 [IU] | Freq: Once | INTRAVENOUS | Status: AC | PRN
  Administered 2015-10-07: 500 [IU] via INTRAVENOUS
  Filled 2015-10-07: qty 5

## 2015-10-07 MED ORDER — DENOSUMAB 120 MG/1.7ML ~~LOC~~ SOLN
120.0000 mg | Freq: Once | SUBCUTANEOUS | Status: AC
  Administered 2015-10-07: 120 mg via SUBCUTANEOUS
  Filled 2015-10-07: qty 1.7

## 2015-10-07 NOTE — Patient Instructions (Signed)
Denosumab injection What is this medicine? DENOSUMAB (den oh sue mab) slows bone breakdown. Prolia is used to treat osteoporosis in women after menopause and in men. Xgeva is used to prevent bone fractures and other bone problems caused by cancer bone metastases. Xgeva is also used to treat giant cell tumor of the bone. This medicine may be used for other purposes; ask your health care provider or pharmacist if you have questions. What should I tell my health care provider before I take this medicine? They need to know if you have any of these conditions: -dental disease -eczema -infection or history of infections -kidney disease or on dialysis -low blood calcium or vitamin D -malabsorption syndrome -scheduled to have surgery or tooth extraction -taking medicine that contains denosumab -thyroid or parathyroid disease -an unusual reaction to denosumab, other medicines, foods, dyes, or preservatives -pregnant or trying to get pregnant -breast-feeding How should I use this medicine? This medicine is for injection under the skin. It is given by a health care professional in a hospital or clinic setting. If you are getting Prolia, a special MedGuide will be given to you by the pharmacist with each prescription and refill. Be sure to read this information carefully each time. For Prolia, talk to your pediatrician regarding the use of this medicine in children. Special care may be needed. For Xgeva, talk to your pediatrician regarding the use of this medicine in children. While this drug may be prescribed for children as young as 13 years for selected conditions, precautions do apply. Overdosage: If you think you have taken too much of this medicine contact a poison control center or emergency room at once. NOTE: This medicine is only for you. Do not share this medicine with others. What if I miss a dose? It is important not to miss your dose. Call your doctor or health care professional if you are  unable to keep an appointment. What may interact with this medicine? Do not take this medicine with any of the following medications: -other medicines containing denosumab This medicine may also interact with the following medications: -medicines that suppress the immune system -medicines that treat cancer -steroid medicines like prednisone or cortisone This list may not describe all possible interactions. Give your health care provider a list of all the medicines, herbs, non-prescription drugs, or dietary supplements you use. Also tell them if you smoke, drink alcohol, or use illegal drugs. Some items may interact with your medicine. What should I watch for while using this medicine? Visit your doctor or health care professional for regular checks on your progress. Your doctor or health care professional may order blood tests and other tests to see how you are doing. Call your doctor or health care professional if you get a cold or other infection while receiving this medicine. Do not treat yourself. This medicine may decrease your body's ability to fight infection. You should make sure you get enough calcium and vitamin D while you are taking this medicine, unless your doctor tells you not to. Discuss the foods you eat and the vitamins you take with your health care professional. See your dentist regularly. Brush and floss your teeth as directed. Before you have any dental work done, tell your dentist you are receiving this medicine. Do not become pregnant while taking this medicine or for 5 months after stopping it. Women should inform their doctor if they wish to become pregnant or think they might be pregnant. There is a potential for serious side effects   to an unborn child. Talk to your health care professional or pharmacist for more information. What side effects may I notice from receiving this medicine? Side effects that you should report to your doctor or health care professional as soon as  possible: -allergic reactions like skin rash, itching or hives, swelling of the face, lips, or tongue -breathing problems -chest pain -fast, irregular heartbeat -feeling faint or lightheaded, falls -fever, chills, or any other sign of infection -muscle spasms, tightening, or twitches -numbness or tingling -skin blisters or bumps, or is dry, peels, or red -slow healing or unexplained pain in the mouth or jaw -unusual bleeding or bruising Side effects that usually do not require medical attention (Report these to your doctor or health care professional if they continue or are bothersome.): -muscle pain -stomach upset, gas This list may not describe all possible side effects. Call your doctor for medical advice about side effects. You may report side effects to FDA at 1-800-FDA-1088. Where should I keep my medicine? This medicine is only given in a clinic, doctor's office, or other health care setting and will not be stored at home. NOTE: This sheet is a summary. It may not cover all possible information. If you have questions about this medicine, talk to your doctor, pharmacist, or health care provider.    2016, Elsevier/Gold Standard. (2011-07-25 12:37:47) Nanoparticle Albumin-Bound Paclitaxel injection What is this medicine? NANOPARTICLE ALBUMIN-BOUND PACLITAXEL (Na no PAHR ti kuhl al BYOO muhn-bound PAK li TAX el) is a chemotherapy drug. It targets fast dividing cells, like cancer cells, and causes these cells to die. This medicine is used to treat advanced breast cancer and advanced lung cancer. This medicine may be used for other purposes; ask your health care provider or pharmacist if you have questions. What should I tell my health care provider before I take this medicine? They need to know if you have any of these conditions: -kidney disease -liver disease -low blood counts, like low platelets, red blood cells, or white blood cells -recent or ongoing radiation therapy -an  unusual or allergic reaction to paclitaxel, albumin, other chemotherapy, other medicines, foods, dyes, or preservatives -pregnant or trying to get pregnant -breast-feeding How should I use this medicine? This drug is given as an infusion into a vein. It is administered in a hospital or clinic by a specially trained health care professional. Talk to your pediatrician regarding the use of this medicine in children. Special care may be needed. Overdosage: If you think you have taken too much of this medicine contact a poison control center or emergency room at once. NOTE: This medicine is only for you. Do not share this medicine with others. What if I miss a dose? It is important not to miss your dose. Call your doctor or health care professional if you are unable to keep an appointment. What may interact with this medicine? -cyclosporine -diazepam -ketoconazole -medicines to increase blood counts like filgrastim, pegfilgrastim, sargramostim -other chemotherapy drugs like cisplatin, doxorubicin, epirubicin, etoposide, teniposide, vincristine -quinidine -testosterone -vaccines -verapamil Talk to your doctor or health care professional before taking any of these medicines: -acetaminophen -aspirin -ibuprofen -ketoprofen -naproxen This list may not describe all possible interactions. Give your health care provider a list of all the medicines, herbs, non-prescription drugs, or dietary supplements you use. Also tell them if you smoke, drink alcohol, or use illegal drugs. Some items may interact with your medicine. What should I watch for while using this medicine? Your condition will be monitored carefully while  you are receiving this medicine. You will need important blood work done while you are taking this medicine. This drug may make you feel generally unwell. This is not uncommon, as chemotherapy can affect healthy cells as well as cancer cells. Report any side effects. Continue your course  of treatment even though you feel ill unless your doctor tells you to stop. In some cases, you may be given additional medicines to help with side effects. Follow all directions for their use. Call your doctor or health care professional for advice if you get a fever, chills or sore throat, or other symptoms of a cold or flu. Do not treat yourself. This drug decreases your body's ability to fight infections. Try to avoid being around people who are sick. This medicine may increase your risk to bruise or bleed. Call your doctor or health care professional if you notice any unusual bleeding. Be careful brushing and flossing your teeth or using a toothpick because you may get an infection or bleed more easily. If you have any dental work done, tell your dentist you are receiving this medicine. Avoid taking products that contain aspirin, acetaminophen, ibuprofen, naproxen, or ketoprofen unless instructed by your doctor. These medicines may hide a fever. Do not become pregnant while taking this medicine. Women should inform their doctor if they wish to become pregnant or think they might be pregnant. There is a potential for serious side effects to an unborn child. Talk to your health care professional or pharmacist for more information. Do not breast-feed an infant while taking this medicine. Men are advised not to father a child while receiving this medicine. What side effects may I notice from receiving this medicine? Side effects that you should report to your doctor or health care professional as soon as possible: -allergic reactions like skin rash, itching or hives, swelling of the face, lips, or tongue -low blood counts - This drug may decrease the number of white blood cells, red blood cells and platelets. You may be at increased risk for infections and bleeding. -signs of infection - fever or chills, cough, sore throat, pain or difficulty passing urine -signs of decreased platelets or bleeding -  bruising, pinpoint red spots on the skin, black, tarry stools, nosebleeds -signs of decreased red blood cells - unusually weak or tired, fainting spells, lightheadedness -breathing problems -changes in vision -chest pain -high or low blood pressure -mouth sores -nausea and vomiting -pain, swelling, redness or irritation at the injection site -pain, tingling, numbness in the hands or feet -slow or irregular heartbeat -swelling of the ankle, feet, hands Side effects that usually do not require medical attention (report to your doctor or health care professional if they continue or are bothersome): -aches, pains -changes in the color of fingernails -diarrhea -hair loss -loss of appetite This list may not describe all possible side effects. Call your doctor for medical advice about side effects. You may report side effects to FDA at 1-800-FDA-1088. Where should I keep my medicine? This drug is given in a hospital or clinic and will not be stored at home. NOTE: This sheet is a summary. It may not cover all possible information. If you have questions about this medicine, talk to your doctor, pharmacist, or health care provider.    2016, Elsevier/Gold Standard. (2012-03-19 16:48:50)   Bevacizumab injection What is this medicine? BEVACIZUMAB (be va SIZ yoo mab) is a monoclonal antibody. It is used to treat cervical cancer, colorectal cancer, glioblastoma multiforme, non-small cell lung cancer (  NSCLC), ovarian cancer, and renal cell cancer. This medicine may be used for other purposes; ask your health care provider or pharmacist if you have questions. What should I tell my health care provider before I take this medicine? They need to know if you have any of these conditions: -blood clots -heart disease, including heart failure, heart attack, or chest pain (angina) -high blood pressure -infection (especially a virus infection such as chickenpox, cold sores, or herpes) -kidney  disease -lung disease -prior chemotherapy with doxorubicin, daunorubicin, epirubicin, or other anthracycline type chemotherapy agents -recent or ongoing radiation therapy -recent surgery -stroke -an unusual or allergic reaction to bevacizumab, hamster proteins, mouse proteins, other medicines, foods, dyes, or preservatives -pregnant or trying to get pregnant -breast-feeding How should I use this medicine? This medicine is for infusion into a vein. It is given by a health care professional in a hospital or clinic setting. Talk to your pediatrician regarding the use of this medicine in children. Special care may be needed. Overdosage: If you think you have taken too much of this medicine contact a poison control center or emergency room at once. NOTE: This medicine is only for you. Do not share this medicine with others. What if I miss a dose? It is important not to miss your dose. Call your doctor or health care professional if you are unable to keep an appointment. What may interact with this medicine? Interactions are not expected. This list may not describe all possible interactions. Give your health care provider a list of all the medicines, herbs, non-prescription drugs, or dietary supplements you use. Also tell them if you smoke, drink alcohol, or use illegal drugs. Some items may interact with your medicine. What should I watch for while using this medicine? Your condition will be monitored carefully while you are receiving this medicine. You will need important blood work and urine testing done while you are taking this medicine. During your treatment, let your health care professional know if you have any unusual symptoms, such as difficulty breathing. This medicine may rarely cause 'gastrointestinal perforation' (holes in the stomach, intestines or colon), a serious side effect requiring surgery to repair. This medicine should be started at least 28 days following major surgery and  the site of the surgery should be totally healed. Check with your doctor before scheduling dental work or surgery while you are receiving this treatment. Talk to your doctor if you have recently had surgery or if you have a wound that has not healed. Do not become pregnant while taking this medicine or for 6 months after stopping it. Women should inform their doctor if they wish to become pregnant or think they might be pregnant. There is a potential for serious side effects to an unborn child. Talk to your health care professional or pharmacist for more information. Do not breast-feed an infant while taking this medicine. This medicine has caused ovarian failure in some women. This medicine may interfere with the ability to have a child. You should talk to your doctor or health care professional if you are concerned about your fertility. What side effects may I notice from receiving this medicine? Side effects that you should report to your doctor or health care professional as soon as possible: -allergic reactions like skin rash, itching or hives, swelling of the face, lips, or tongue -signs of infection - fever or chills, cough, sore throat, pain or trouble passing urine -signs of decreased platelets or bleeding - bruising, pinpoint red spots on  the skin, black, tarry stools, nosebleeds, blood in the urine -breathing problems -changes in vision -chest pain -confusion -jaw pain, especially after dental work -mouth sores -seizures -severe abdominal pain -severe headache -sudden numbness or weakness of the face, arm or leg -swelling of legs or ankles -symptoms of a stroke: change in mental awareness, inability to talk or move one side of the body (especially in patients with lung cancer) -trouble passing urine or change in the amount of urine -trouble speaking or understanding -trouble walking, dizziness, loss of balance or coordination Side effects that usually do not require medical  attention (report to your doctor or health care professional if they continue or are bothersome): -constipation -diarrhea -dry skin -headache -loss of appetite -nausea, vomiting This list may not describe all possible side effects. Call your doctor for medical advice about side effects. You may report side effects to FDA at 1-800-FDA-1088. Where should I keep my medicine? This drug is given in a hospital or clinic and will not be stored at home. NOTE: This sheet is a summary. It may not cover all possible information. If you have questions about this medicine, talk to your doctor, pharmacist, or health care provider.    2016, Elsevier/Gold Standard. (2014-03-25 16:58:44)

## 2015-10-07 NOTE — Progress Notes (Signed)
Hematology and Oncology Follow Up Visit  [name] [id number] [birth year] 65 y.o. 10/07/2015   Principle Diagnosis:  Metastatic adenocarcinoma the lung - bone metastases, lymph node metastasis and intrapulmonary metastasis Malignant left pleural effusion  Current Therapy:   AbraxaneAvastin - s/p cycle 3 Navelbine/Avastin q 2 wk dosing - s/p cycle 4 - progression Tecentriq s/p cycle 4 Xgeva 120 mg subcutaneous every month Palliative radiation therapy to the neck.    Interim History:  [name] is here today with her husband for a follow-up and cycle 4 of treatment. We gave her a week off last week due to neck pain and to recuperate from her recent hospital stay. She is now feeling much better. Her energy has improved and her neck stiffness is much better with the Flexeril.  No fever, chills, n/v, cough, rash, dizziness, vision changes, sensitivity to light, chest pain, palpitations, abdominal pain or changes in bowel or bladder habits.  Her SOB with exertion is unchanged. She will take time to rest until it resolves.  No swelling, tenderness, numbness or tingling in her extremities. No lymphadenopathy found on exam.  She has maintained a good appetite and is staying better hydrated. Her weight is stable.   Medications:    Medication List       Accurate as of 10/07/15 10:21 AM. Always use your most recent med list.          aspirin 81 MG tablet Take 81 mg by mouth daily.   budesonide-formoterol 160-4.5 MCG/ACT inhaler Commonly known as:  SYMBICORT Inhale 2 puffs into the lungs 2 (two) times daily as needed (SOB, wheezing).   calcium-vitamin D 250-125 MG-UNIT tablet Commonly known as:  OSCAL Take 1 tablet by mouth daily.   cyclobenzaprine 5 MG tablet Commonly known as:  FLEXERIL Take 1 tablet (5 mg total) by mouth 3 (three) times daily as needed for muscle spasms.   fish oil-omega-3 fatty acids 1000 MG capsule Take 1 g by mouth daily.   folic acid 1 MG  tablet Commonly known as:  FOLVITE Take 1 mg by mouth daily.   gabapentin 300 MG capsule Commonly known as:  NEURONTIN Take 1 capsule (300 mg total) by mouth 3 (three) times daily.   KLOR-CON M20 20 MEQ tablet Generic drug:  potassium chloride SA TAKE 1 TABLET BY MOUTH DAILY   lactulose 10 GM/15ML solution Commonly known as:  CHRONULAC Take 10 g by mouth daily. Reported on 07/22/2015   lidocaine-prilocaine cream Commonly known as:  EMLA Apply 1 application topically daily as needed (pain).   omeprazole 40 MG capsule Commonly known as:  PRILOSEC Take 40 mg by mouth 2 (two) times daily.   OxyCODONE HCl (Abuse Deter) 5 MG Taba Commonly known as:  OXAYDO Take 5 mg by mouth every 4 (four) hours as needed (pain).   oxyCODONE 10 mg 12 hr tablet Commonly known as:  OXYCONTIN Take 1 tablet (10 mg total) by mouth every 12 (twelve) hours.   polyethylene glycol powder powder Commonly known as:  GLYCOLAX/MIRALAX Take 17 g by mouth once. Dissolve 1 capful of powder into any liquid and take once daily. Can increase to 2 times daily if no effect after 3-4 days.   PROAIR RESPICLICK 324 (90 Base) MCG/ACT Aepb Generic drug:  Albuterol Sulfate Inhale 2 puffs into the lungs twice daily as needed for shortness of breath or wheezing   prochlorperazine 10 MG tablet Commonly known as:  COMPAZINE Take 10 mg by mouth every 6 (six) hours  as needed for nausea or vomiting.   senna-docusate 8.6-50 MG tablet Commonly known as:  Senokot-S Take 1 tablet by mouth daily.   SUMAtriptan 100 MG tablet Commonly known as:  IMITREX Take 100 mg by mouth as needed for migraine or headache. Reported on 04/01/2015       Allergies:  Allergies  Allergen Reactions  . Demerol Other (See Comments)    Syncope  . Mefoxin [Cefoxitin Sodium In Dextrose]     rash  . Topamax Other (See Comments)    Had trouble walking  . Avelox [Moxifloxacin Hcl In Nacl]     Hypotension.    Past Medical History, Surgical  history, Social history, and Family History were reviewed and updated.  Review of Systems: All other 10 point review of systems is negative.   Physical Exam:  height is '5\' 1"'$  (1.549 m) and weight is 131 lb 12.8 oz (59.8 kg). Her oral temperature is 98 F (36.7 C). Her blood pressure is 141/87 (abnormal) and her pulse is 86. Her respiration is 18.   Wt Readings from Last 3 Encounters:  10/07/15 131 lb 12.8 oz (59.8 kg)  09/30/15 129 lb (58.5 kg)  09/21/15 128 lb (58.1 kg)    Ocular: Sclerae unicteric, pupils equal, round and reactive to light Ear-nose-throat: Oropharynx clear, dentition fair Lymphatic: No cervical supraclavicular or axillary adenopathy Lungs no rales or rhonchi, good excursion bilaterally Heart regular rate and rhythm, no murmur appreciated Abd soft, some mild tenderness in her right upper quadrant, positive bowel sounds, no liver or spleen tip palpated on exam, no fluid wave MSK no focal spinal tenderness, no joint edema Neuro: non-focal, well-oriented, appropriate affect Breasts: Deferred   Lab Results  Component Value Date   WBC 7.3 10/07/2015   HGB 11.3 (L) 10/07/2015   HCT 34.9 10/07/2015   MCV 86 10/07/2015   PLT 243 10/07/2015   Lab Results  Component Value Date   FERRITIN 47 10/29/2013   IRON 64 10/29/2013   TIBC 233 (L) 10/29/2013   UIBC 169 10/29/2013   IRONPCTSAT 27 10/29/2013   Lab Results  Component Value Date   RETICCTPCT 1.4 08/13/2008   RBC 4.06 10/07/2015   RETICCTABS 61.6 08/13/2008   No results found for: KPAFRELGTCHN, LAMBDASER, KAPLAMBRATIO No results found for: IGGSERUM, IGA, IGMSERUM No results found for: Odetta Pink, SPEI   Chemistry      Component Value Date/Time   NA 136 10/07/2015 0903   NA 140 07/22/2015 1255   K 3.8 10/07/2015 0903   K 4.0 07/22/2015 1255   CL 108 10/07/2015 0903   CO2 25 10/07/2015 0903   CO2 27 07/22/2015 1255   BUN 11 10/07/2015 0903    BUN 9.8 07/22/2015 1255   CREATININE 0.5 (L) 10/07/2015 0903   CREATININE 0.6 07/22/2015 1255      Component Value Date/Time   CALCIUM 8.1 10/07/2015 0903   CALCIUM 9.1 07/22/2015 1255   ALKPHOS 97 (H) 10/07/2015 0903   AST 20 10/07/2015 0903   ALT 16 10/07/2015 0903   BILITOT 0.50 10/07/2015 0903     Impression and Plan: [name] is a very pleasant 65 yo white female with metastatic adenocarcinoma of the lung with no "driver" mutations that we can target. She had a week off and is now feeling much better. Her energy has improved as has her neck pain with the flexeril.  Her Hgb is stable at 11.3 with an MCV of 86. WBC count  is 7.4 with normal diff and platelets are 243.  We will proceed with cycle 4 of treatment as planned.  She has her current treatment and appointment schedule.  She will contact our office with any questions or concerns. We can certainly see her sooner if need be.   Eliezer Bottom, NP 8/30/201710:21 AM

## 2015-10-08 ENCOUNTER — Ambulatory Visit: Payer: PPO | Admitting: Hematology & Oncology

## 2015-10-08 ENCOUNTER — Ambulatory Visit: Payer: PPO

## 2015-10-08 ENCOUNTER — Other Ambulatory Visit: Payer: PPO

## 2015-10-14 ENCOUNTER — Ambulatory Visit: Payer: PPO | Admitting: Hematology & Oncology

## 2015-10-14 ENCOUNTER — Ambulatory Visit: Payer: PPO

## 2015-10-14 ENCOUNTER — Other Ambulatory Visit: Payer: PPO

## 2015-10-15 ENCOUNTER — Ambulatory Visit: Payer: PPO

## 2015-10-15 ENCOUNTER — Other Ambulatory Visit: Payer: PPO

## 2015-10-15 ENCOUNTER — Ambulatory Visit: Payer: PPO | Admitting: Hematology & Oncology

## 2015-10-21 ENCOUNTER — Encounter: Payer: Self-pay | Admitting: Hematology & Oncology

## 2015-10-21 ENCOUNTER — Ambulatory Visit (HOSPITAL_BASED_OUTPATIENT_CLINIC_OR_DEPARTMENT_OTHER): Payer: PPO

## 2015-10-21 ENCOUNTER — Other Ambulatory Visit (HOSPITAL_BASED_OUTPATIENT_CLINIC_OR_DEPARTMENT_OTHER): Payer: PPO

## 2015-10-21 ENCOUNTER — Other Ambulatory Visit: Payer: PPO

## 2015-10-21 ENCOUNTER — Ambulatory Visit (HOSPITAL_BASED_OUTPATIENT_CLINIC_OR_DEPARTMENT_OTHER): Payer: PPO | Admitting: Hematology & Oncology

## 2015-10-21 ENCOUNTER — Ambulatory Visit: Payer: PPO

## 2015-10-21 VITALS — BP 133/87 | HR 90 | Resp 18

## 2015-10-21 DIAGNOSIS — Z5112 Encounter for antineoplastic immunotherapy: Secondary | ICD-10-CM

## 2015-10-21 DIAGNOSIS — C7951 Secondary malignant neoplasm of bone: Secondary | ICD-10-CM

## 2015-10-21 DIAGNOSIS — J910 Malignant pleural effusion: Secondary | ICD-10-CM

## 2015-10-21 DIAGNOSIS — C3492 Malignant neoplasm of unspecified part of left bronchus or lung: Secondary | ICD-10-CM

## 2015-10-21 DIAGNOSIS — C3490 Malignant neoplasm of unspecified part of unspecified bronchus or lung: Secondary | ICD-10-CM

## 2015-10-21 DIAGNOSIS — C3491 Malignant neoplasm of unspecified part of right bronchus or lung: Secondary | ICD-10-CM

## 2015-10-21 DIAGNOSIS — Z5111 Encounter for antineoplastic chemotherapy: Secondary | ICD-10-CM

## 2015-10-21 LAB — CMP (CANCER CENTER ONLY)
ALBUMIN: 3.8 g/dL (ref 3.3–5.5)
ALT(SGPT): 16 U/L (ref 10–47)
AST: 20 U/L (ref 11–38)
Alkaline Phosphatase: 110 U/L — ABNORMAL HIGH (ref 26–84)
BILIRUBIN TOTAL: 0.6 mg/dL (ref 0.20–1.60)
BUN: 14 mg/dL (ref 7–22)
CO2: 25 meq/L (ref 18–33)
CREATININE: 0.8 mg/dL (ref 0.6–1.2)
Calcium: 8.9 mg/dL (ref 8.0–10.3)
Chloride: 105 mEq/L (ref 98–108)
Glucose, Bld: 121 mg/dL — ABNORMAL HIGH (ref 73–118)
Potassium: 3.6 mEq/L (ref 3.3–4.7)
SODIUM: 137 meq/L (ref 128–145)
Total Protein: 6.8 g/dL (ref 6.4–8.1)

## 2015-10-21 LAB — CBC WITH DIFFERENTIAL (CANCER CENTER ONLY)
BASO#: 0.1 10*3/uL (ref 0.0–0.2)
BASO%: 1.5 % (ref 0.0–2.0)
EOS%: 3.9 % (ref 0.0–7.0)
Eosinophils Absolute: 0.3 10*3/uL (ref 0.0–0.5)
HCT: 36 % (ref 34.8–46.6)
HEMOGLOBIN: 11.8 g/dL (ref 11.6–15.9)
LYMPH#: 2 10*3/uL (ref 0.9–3.3)
LYMPH%: 30 % (ref 14.0–48.0)
MCH: 28.1 pg (ref 26.0–34.0)
MCHC: 32.8 g/dL (ref 32.0–36.0)
MCV: 86 fL (ref 81–101)
MONO#: 0.7 10*3/uL (ref 0.1–0.9)
MONO%: 10.2 % (ref 0.0–13.0)
NEUT%: 54.4 % (ref 39.6–80.0)
NEUTROS ABS: 3.5 10*3/uL (ref 1.5–6.5)
PLATELETS: 230 10*3/uL (ref 145–400)
RBC: 4.2 10*6/uL (ref 3.70–5.32)
RDW: 17.7 % — ABNORMAL HIGH (ref 11.1–15.7)
WBC: 6.5 10*3/uL (ref 3.9–10.0)

## 2015-10-21 LAB — LACTATE DEHYDROGENASE: LDH: 167 U/L (ref 125–245)

## 2015-10-21 MED ORDER — HEPARIN SOD (PORK) LOCK FLUSH 100 UNIT/ML IV SOLN
500.0000 [IU] | Freq: Once | INTRAVENOUS | Status: AC | PRN
  Administered 2015-10-21: 500 [IU]
  Filled 2015-10-21: qty 5

## 2015-10-21 MED ORDER — SODIUM CHLORIDE 0.9 % IV SOLN
10.0000 mg/kg | Freq: Once | INTRAVENOUS | Status: AC
  Administered 2015-10-21: 575 mg via INTRAVENOUS
  Filled 2015-10-21: qty 16

## 2015-10-21 MED ORDER — SODIUM CHLORIDE 0.9 % IV SOLN
Freq: Once | INTRAVENOUS | Status: AC
  Administered 2015-10-21: 15:00:00 via INTRAVENOUS

## 2015-10-21 MED ORDER — SODIUM CHLORIDE 0.9% FLUSH
10.0000 mL | INTRAVENOUS | Status: DC | PRN
  Administered 2015-10-21: 10 mL
  Filled 2015-10-21: qty 10

## 2015-10-21 MED ORDER — PACLITAXEL PROTEIN-BOUND CHEMO INJECTION 100 MG
81.0000 mg/m2 | Freq: Once | INTRAVENOUS | Status: AC
  Administered 2015-10-21: 125 mg via INTRAVENOUS
  Filled 2015-10-21: qty 25

## 2015-10-21 MED ORDER — PROCHLORPERAZINE MALEATE 10 MG PO TABS
ORAL_TABLET | ORAL | Status: AC
  Filled 2015-10-21: qty 1

## 2015-10-21 MED ORDER — PROCHLORPERAZINE MALEATE 10 MG PO TABS
10.0000 mg | ORAL_TABLET | Freq: Once | ORAL | Status: AC
  Administered 2015-10-21: 10 mg via ORAL

## 2015-10-21 NOTE — Progress Notes (Signed)
This is a  Hematology and Oncology Follow Up Visit  [name] [id number] 03/19/1950 65 y.o. 10/21/2015   Principle Diagnosis:  Metastatic adenocarcinoma the lung - bone metastases, lymph node metastasis and intrapulmonary metastasis Malignant left pleural effusion  Current Therapy:  AbraxaneAvastin -s/p cycle #4 Navelbine/Avastin q 2wk dosing - s/p c#4 - progression  Tecentriq s/p cycle 4 Xgeva 120 mg subcutaneous every month Palliative radiation therapy to the neck.    Interim History:  Ms. Backs is here today with her husband. I think that the most important aspect of her life right now is that her granddaughter now is in the custody of her daughter. This is been a really difficult time for her. Now that everything is been settled in the Court of Law, she is feeling better.  She still is not yet seen ENT. We called their office today. She will have an appointment on September 25.  The left side of her neck still feels a little stiff. I don't that she has had radiation there before. There is no weakness in the left arm.  She seems to be more hoarse. I think she probably has some recurrent laryngeal nerve involvement.   Her appetite is doing quite well.   She is on pretty good pain control right now. She does appear to be more comfortable.  She has had no problems with bleeding. She's had no problems with bowels or bladder. She's had no cough or shortness of breath.  Overall, her performance status is ECOG 1.    Medications:    Medication List       Accurate as of 10/21/15  5:47 PM. Always use your most recent med list.          aspirin 81 MG tablet Take 81 mg by mouth daily.   budesonide-formoterol 160-4.5 MCG/ACT inhaler Commonly known as:  SYMBICORT Inhale 2 puffs into the lungs 2 (two) times daily as needed (SOB, wheezing).   calcium-vitamin D 250-125 MG-UNIT tablet Commonly known as:  OSCAL Take 1 tablet by mouth daily.   cyclobenzaprine 5 MG  tablet Commonly known as:  FLEXERIL Take 1 tablet (5 mg total) by mouth 3 (three) times daily as needed for muscle spasms.   fish oil-omega-3 fatty acids 1000 MG capsule Take 1 g by mouth daily.   folic acid 1 MG tablet Commonly known as:  FOLVITE Take 1 mg by mouth daily.   KLOR-CON M20 20 MEQ tablet Generic drug:  potassium chloride SA TAKE 1 TABLET BY MOUTH DAILY   lactulose 10 GM/15ML solution Commonly known as:  CHRONULAC Take 10 g by mouth daily. Reported on 07/22/2015   lidocaine-prilocaine cream Commonly known as:  EMLA Apply 1 application topically daily as needed (pain).   omeprazole 40 MG capsule Commonly known as:  PRILOSEC Take 40 mg by mouth 2 (two) times daily.   OxyCODONE HCl (Abuse Deter) 5 MG Taba Commonly known as:  OXAYDO Take 5 mg by mouth every 4 (four) hours as needed (pain).   oxyCODONE 10 mg 12 hr tablet Commonly known as:  OXYCONTIN Take 1 tablet (10 mg total) by mouth every 12 (twelve) hours.   polyethylene glycol powder powder Commonly known as:  GLYCOLAX/MIRALAX Take 17 g by mouth once. Dissolve 1 capful of powder into any liquid and take once daily. Can increase to 2 times daily if no effect after 3-4 days.   PROAIR RESPICLICK 191 (90 Base) MCG/ACT Aepb Generic drug:  Albuterol Sulfate Inhale 2 puffs  into the lungs twice daily as needed for shortness of breath or wheezing   prochlorperazine 10 MG tablet Commonly known as:  COMPAZINE Take 10 mg by mouth every 6 (six) hours as needed for nausea or vomiting.   senna-docusate 8.6-50 MG tablet Commonly known as:  Senokot-S Take 1 tablet by mouth daily.   SUMAtriptan 100 MG tablet Commonly known as:  IMITREX Take 100 mg by mouth as needed for migraine or headache. Reported on 04/01/2015       Allergies:  Allergies  Allergen Reactions  . Demerol Other (See Comments)    Syncope  . Mefoxin [Cefoxitin Sodium In Dextrose]     rash  . Topamax Other (See Comments)    Had trouble walking   . Avelox [Moxifloxacin Hcl In Nacl]     Hypotension.    Past Medical History, Surgical history, Social history, and Family History were reviewed and updated.  Review of Systems: All other 10 point review of systems is negative.   Physical Exam:  weight is 130 lb 12 oz (59.3 kg). Her oral temperature is 98.3 F (36.8 C). Her blood pressure is 129/87 and her pulse is 90. Her respiration is 16.   Wt Readings from Last 3 Encounters:  10/21/15 130 lb 12 oz (59.3 kg)  10/07/15 131 lb 12.8 oz (59.8 kg)  09/30/15 129 lb (58.5 kg)    Well-developed and well-nourished white female in no obvious distress. Head and neck exam shows no ocular or oral lesions. There are no palpable cervical or supraclavicular lymph nodes. Lungs are clear on the right side. There is still some slight decrease breath sounds on the left side. Cardiac exam regular rate and rhythm with no murmurs, rubs or bruits. Abdomen is soft. She is good bowel sounds. There is no fluid wave. There is no palpable liver or spleen tip. Back exam shows no tenderness over the spine, ribs or hips. Extremities shows no clubbing, cyanosis or edema. Skin exam shows no rashes, ecchymoses or petechia. Neurological exam shows no focal neurological deficits.   Lab Results  Component Value Date   WBC 6.5 10/21/2015   HGB 11.8 10/21/2015   HCT 36.0 10/21/2015   MCV 86 10/21/2015   PLT 230 10/21/2015   Lab Results  Component Value Date   FERRITIN 47 10/29/2013   IRON 64 10/29/2013   TIBC 233 (L) 10/29/2013   UIBC 169 10/29/2013   IRONPCTSAT 27 10/29/2013   Lab Results  Component Value Date   RETICCTPCT 1.4 08/13/2008   RBC 4.20 10/21/2015   RETICCTABS 61.6 08/13/2008   No results found for: KPAFRELGTCHN, LAMBDASER, KAPLAMBRATIO No results found for: IGGSERUM, IGA, IGMSERUM No results found for: Ronnald Ramp, A1GS, A2GS, Tillman Sers, SPEI   Chemistry      Component Value Date/Time   NA 137 10/21/2015  1200   NA 140 07/22/2015 1255   K 3.6 10/21/2015 1200   K 4.0 07/22/2015 1255   CL 105 10/21/2015 1200   CO2 25 10/21/2015 1200   CO2 27 07/22/2015 1255   BUN 14 10/21/2015 1200   BUN 9.8 07/22/2015 1255   CREATININE 0.8 10/21/2015 1200   CREATININE 0.6 07/22/2015 1255      Component Value Date/Time   CALCIUM 8.9 10/21/2015 1200   CALCIUM 9.1 07/22/2015 1255   ALKPHOS 110 (H) 10/21/2015 1200   AST 20 10/21/2015 1200   ALT 16 10/21/2015 1200   BILITOT 0.60 10/21/2015 1200     Impression and Plan:  Ms. Burkard is a pleasant 65 yo white female with metastatic adenocarcinoma of the lung. She has no "Driver" mutations that we can target.  We will go ahead and get her set up with her fifth cycle of treatment. I think she's done pretty well with the Alimta/Avastin combination. She seems to be tolerating it pretty well.   I made sure that we got her a a point with ENT. I'm not sure as to why they have not called her. The hoarseness is a real problem. I suspect that she has recurrent laryngeal nerve dysfunction.   It does sound like the family issues are settling down nicely. This really takes a lot of pressure off of Ms.Monnin.   I will like to see her back in another couple weeks.  I will not do another PET scan on her until after her eighth cycle of treatment.    Volanda Napoleon, MD 9/13/20175:47 PM

## 2015-10-21 NOTE — Patient Instructions (Signed)
[facility] Discharge Instructions for Patients Receiving Chemotherapy  Today you received the following chemotherapy agents Abraxane and Avastin.  To help prevent nausea and vomiting after your treatment, we encourage you to take your nausea medication.   If you develop nausea and vomiting that is not controlled by your nausea medication, call the clinic.   BELOW ARE SYMPTOMS THAT SHOULD BE REPORTED IMMEDIATELY:  *FEVER GREATER THAN 100.5 F  *CHILLS WITH OR WITHOUT FEVER  NAUSEA AND VOMITING THAT IS NOT CONTROLLED WITH YOUR NAUSEA MEDICATION  *UNUSUAL SHORTNESS OF BREATH  *UNUSUAL BRUISING OR BLEEDING  TENDERNESS IN MOUTH AND THROAT WITH OR WITHOUT PRESENCE OF ULCERS  *URINARY PROBLEMS  *BOWEL PROBLEMS  UNUSUAL RASH Items with * indicate a potential emergency and should be followed up as soon as possible.  Feel free to call the clinic you have any questions or concerns. The clinic phone number is [phone].  Please show the [facility] at check-in to the Emergency Department and triage nurse.

## 2015-10-22 ENCOUNTER — Ambulatory Visit: Payer: PPO | Admitting: Hematology & Oncology

## 2015-10-22 ENCOUNTER — Ambulatory Visit: Payer: PPO

## 2015-10-22 ENCOUNTER — Other Ambulatory Visit: Payer: PPO

## 2015-10-24 ENCOUNTER — Encounter (HOSPITAL_BASED_OUTPATIENT_CLINIC_OR_DEPARTMENT_OTHER): Payer: Self-pay | Admitting: *Deleted

## 2015-10-24 ENCOUNTER — Emergency Department (HOSPITAL_BASED_OUTPATIENT_CLINIC_OR_DEPARTMENT_OTHER)
Admission: EM | Admit: 2015-10-24 | Discharge: 2015-10-24 | Disposition: A | Discharge status: Home / Self Care | Payer: PPO | Attending: Emergency Medicine | Admitting: Emergency Medicine

## 2015-10-24 DIAGNOSIS — I10 Essential (primary) hypertension: Secondary | ICD-10-CM | POA: Diagnosis not present

## 2015-10-24 DIAGNOSIS — Z85118 Personal history of other malignant neoplasm of bronchus and lung: Secondary | ICD-10-CM | POA: Insufficient documentation

## 2015-10-24 DIAGNOSIS — Z7982 Long term (current) use of aspirin: Secondary | ICD-10-CM | POA: Diagnosis not present

## 2015-10-24 DIAGNOSIS — J449 Chronic obstructive pulmonary disease, unspecified: Secondary | ICD-10-CM | POA: Diagnosis not present

## 2015-10-24 DIAGNOSIS — Z79899 Other long term (current) drug therapy: Secondary | ICD-10-CM | POA: Diagnosis not present

## 2015-10-24 DIAGNOSIS — Z7951 Long term (current) use of inhaled steroids: Secondary | ICD-10-CM | POA: Insufficient documentation

## 2015-10-24 DIAGNOSIS — Z79891 Long term (current) use of opiate analgesic: Secondary | ICD-10-CM | POA: Insufficient documentation

## 2015-10-24 DIAGNOSIS — G44019 Episodic cluster headache, not intractable: Secondary | ICD-10-CM | POA: Diagnosis not present

## 2015-10-24 DIAGNOSIS — Z87891 Personal history of nicotine dependence: Secondary | ICD-10-CM | POA: Diagnosis not present

## 2015-10-24 DIAGNOSIS — J329 Chronic sinusitis, unspecified: Secondary | ICD-10-CM | POA: Insufficient documentation

## 2015-10-24 DIAGNOSIS — R51 Headache: Secondary | ICD-10-CM | POA: Diagnosis not present

## 2015-10-24 DIAGNOSIS — J3489 Other specified disorders of nose and nasal sinuses: Secondary | ICD-10-CM | POA: Diagnosis not present

## 2015-10-24 DIAGNOSIS — R519 Headache, unspecified: Secondary | ICD-10-CM

## 2015-10-24 MED ORDER — AMOXICILLIN-POT CLAVULANATE 875-125 MG PO TABS: 1.0000 | ORAL_TABLET | Freq: Two times a day (BID) | ORAL | 0 refills | Status: AC

## 2015-10-24 MED ORDER — KETOROLAC TROMETHAMINE 30 MG/ML IJ SOLN
10.0000 mg | Freq: Once | INTRAMUSCULAR | Status: AC
  Administered 2015-10-24: 9.9 mg via INTRAVENOUS
  Filled 2015-10-24: qty 1

## 2015-10-24 MED ORDER — DIPHENHYDRAMINE HCL 50 MG/ML IJ SOLN
25.0000 mg | Freq: Once | INTRAMUSCULAR | Status: AC
  Administered 2015-10-24: 25 mg via INTRAVENOUS
  Filled 2015-10-24: qty 1

## 2015-10-24 MED ORDER — SODIUM CHLORIDE 0.9 % IV BOLUS (SEPSIS)
1000.0000 mL | Freq: Once | INTRAVENOUS | Status: AC
  Administered 2015-10-24: 1000 mL via INTRAVENOUS

## 2015-10-24 MED ORDER — PROCHLORPERAZINE EDISYLATE 5 MG/ML IJ SOLN
10.0000 mg | Freq: Once | INTRAMUSCULAR | Status: AC
  Administered 2015-10-24: 10 mg via INTRAVENOUS
  Filled 2015-10-24: qty 2

## 2015-10-24 MED ORDER — AMOXICILLIN-POT CLAVULANATE 875-125 MG PO TABS
1.0000 | ORAL_TABLET | Freq: Once | ORAL | Status: AC
  Administered 2015-10-24: 1 via ORAL
  Filled 2015-10-24: qty 1

## 2015-10-24 MED ORDER — DEXAMETHASONE SODIUM PHOSPHATE 10 MG/ML IJ SOLN
10.0000 mg | Freq: Once | INTRAMUSCULAR | Status: AC
  Administered 2015-10-24: 10 mg via INTRAVENOUS
  Filled 2015-10-24: qty 1

## 2015-10-24 NOTE — ED Provider Notes (Signed)
[facility] DEPT [facility] Provider Note   CSN: [id number] Arrival date & time: 10/24/15  1133     History   Chief Complaint Chief Complaint  Patient presents with  . Headache    HPI [name] is a 65 y.o. female.   Headache   This is a recurrent problem. The current episode started 6 to 12 hours ago. The problem occurs constantly. The problem has been gradually improving. The headache is associated with nothing. The pain is located in the bilateral and frontal region. The quality of the pain is described as dull. The pain is at a severity of 10/10. The pain is mild. The pain does not radiate. Pertinent negatives include no anorexia, no fever, no palpitations, no nausea and no vomiting. She has tried acetaminophen for the symptoms. The treatment provided moderate relief.    Past Medical History:  Diagnosis Date  . B12 deficiency   . Cancer [facility]) 2005 & 2008   Right Upper Lobe (2005) & Left Upper Lobe (2008). Subsequent Nodules treated empirically in 2010 & 2011 w/ XRT & chemo.  Marland Kitchen GERD (gastroesophageal reflux disease)   . Hypertension   . Lung cancer, primary, with metastasis from lung to other site [facility]) 04/01/2015  . Pleural effusion, malignant 04/01/2015  . Radiation 07/03/15-07/16/15   C3 spine 30 Gy  . Stroke ([place])   . Vitamin D deficiency     Patient Active Problem List   Diagnosis Date Noted  . Metabolic disorder 78/93/8101  . Hypocalcemia   . Hypophosphatemia   . Dehydration 09/22/2015  . Viral gastroenteritis 09/21/2015  . Headache 09/21/2015  . Prolonged QT interval 09/21/2015  . Drug-induced constipation 07/03/2015  . Lung cancer, primary, with metastasis from lung to other site [facility]) 04/01/2015  . Pleural effusion, malignant 04/01/2015  . Bone metastasis ([place]) 04/01/2015  . Chest wall pain 11/27/2014  . Sepsis due to Escherichia coli ([place]) 11/26/2014  . Anxiety 11/24/2014  . CN (constipation) 11/24/2014  . Clinical depression 11/24/2014  . Essential  (primary) hypertension 11/24/2014  . Cerebrovascular accident, old 11/24/2014  . Malignant neoplasm of overlapping sites of unspecified bronchus and lung ([place]) 11/24/2014  . Headache, migraine 11/24/2014  . N&V (nausea and vomiting) 11/24/2014  . Pleural cavity effusion 11/24/2014  . Syncope and collapse 11/24/2014  . S/P thoracentesis   . COPD (chronic obstructive pulmonary disease) ([place]) 10/02/2014  . Restrictive lung disease 10/02/2014  . GERD (gastroesophageal reflux disease) 10/02/2014  . Hiatal hernia 10/02/2014  . Dyspnea 09/02/2014  . Cancer of lung ([place]) 05/02/2011    Past Surgical History:  Procedure Laterality Date  . APPENDECTOMY    . BRONCHOSCOPY    . CHOLECYSTECTOMY    . cyst removed from ovary  1968  . HIATAL HERNIA REPAIR  2010  . LUNG REMOVAL, PARTIAL  2005, 2008   R upper lobe and left upper lobe wedge  . MEDIASTINOSCOPY    . VAGINAL HYSTERECTOMY  1983    OB History    No data available       Home Medications    Prior to Admission medications   Medication Sig Start Date End Date Taking? Authorizing Provider  aspirin 81 MG tablet Take 81 mg by mouth daily.     Yes Historical Provider, MD  budesonide-formoterol (SYMBICORT) 160-4.5 MCG/ACT inhaler Inhale 2 puffs into the lungs 2 (two) times daily as needed (SOB, wheezing).    Yes Historical Provider, MD  calcium-vitamin D (OSCAL) 250-125 MG-UNIT per tablet Take 1 tablet  by mouth daily.     Yes Historical Provider, MD  cyclobenzaprine (FLEXERIL) 5 MG tablet Take 1 tablet (5 mg total) by mouth 3 (three) times daily as needed for muscle spasms. 09/30/15  Yes Eliezer Bottom, NP  fish oil-omega-3 fatty acids 1000 MG capsule Take 1 g by mouth daily.    Yes Historical Provider, MD  folic acid (FOLVITE) 1 MG tablet Take 1 mg by mouth daily.   Yes Historical Provider, MD  KLOR-CON M20 20 MEQ tablet TAKE 1 TABLET BY MOUTH DAILY Patient taking differently: TAKE 20 MEQ BY MOUTH ONCE DAILY 12/10/14  Yes Volanda Napoleon, MD  lactulose (CHRONULAC) 10 GM/15ML solution Take 10 g by mouth daily. Reported on 07/22/2015   Yes Historical Provider, MD  lidocaine-prilocaine (EMLA) cream Apply 1 application topically daily as needed (pain).    Yes Historical Provider, MD  omeprazole (PRILOSEC) 40 MG capsule Take 40 mg by mouth 2 (two) times daily.  12/05/13  Yes Historical Provider, MD  oxyCODONE (OXYCONTIN) 10 mg 12 hr tablet Take 1 tablet (10 mg total) by mouth every 12 (twelve) hours. Patient taking differently: Take 10 mg by mouth every 12 (twelve) hours as needed (pain).  06/23/15  Yes Volanda Napoleon, MD  OxyCODONE HCl, Abuse Deter, (OXAYDO) 5 MG TABA Take 5 mg by mouth every 4 (four) hours as needed (pain).    Yes Historical Provider, MD  polyethylene glycol powder (GLYCOLAX/MIRALAX) powder Take 17 g by mouth once. Dissolve 1 capful of powder into any liquid and take once daily. Can increase to 2 times daily if no effect after 3-4 days. Patient taking differently: Take 17 g by mouth daily. Dissolve 1 capful of powder into any liquid and take once daily. Can increase to 2 times daily if no effect after 3-4 days. 06/27/15  Yes Forde Dandy, MD  PROAIR RESPICLICK 867 [phone] BASE) MCG/ACT AEPB Inhale 2 puffs into the lungs twice daily as needed for shortness of breath or wheezing 10/11/14  Yes Historical Provider, MD  prochlorperazine (COMPAZINE) 10 MG tablet Take 10 mg by mouth every 6 (six) hours as needed for nausea or vomiting.  12/26/14  Yes Historical Provider, MD  senna-docusate (SENOKOT-S) 8.6-50 MG tablet Take 1 tablet by mouth daily. 06/27/15  Yes Forde Dandy, MD  SUMAtriptan (IMITREX) 100 MG tablet Take 100 mg by mouth as needed for migraine or headache. Reported on 04/01/2015   Yes Historical Provider, MD    Family History Family History  Problem Relation Age of Onset  . Heart attack Father   . Hypertension Mother   . Diabetes Mother   . Congestive Heart Failure Mother   . Breast cancer Sister   . Lung  disease Neg Hx     Social History Social History  Substance Use Topics  . Smoking status: Former Smoker    Packs/day: 2.00    Years: 33.00    Types: Cigarettes    Start date: 07/19/1967    Quit date: 01/23/2003  . Smokeless tobacco: Never Used  . Alcohol use No     Allergies   Demerol; Mefoxin [cefoxitin sodium in dextrose]; Topamax; and Avelox [moxifloxacin hcl in nacl]   Review of Systems Review of Systems  Constitutional: Negative for fever.  Cardiovascular: Negative for palpitations.  Gastrointestinal: Negative for anorexia, nausea and vomiting.  Neurological: Positive for headaches.     Physical Exam Updated Vital Signs BP 122/82 (BP Location: Right Arm)   Pulse 107  Temp 98.1 F (36.7 C) (Oral)   Resp 20   Ht '5\' 1"'$  (1.549 m)   Wt 130 lb (59 kg)   SpO2 97%   BMI 24.56 kg/m   Physical Exam  Constitutional: She is oriented to person, place, and time. She appears well-developed and well-nourished. No distress.  HENT:  Head: Normocephalic and atraumatic.  Hoarse voice Frontal sinus tenderness to percussion  Eyes: Conjunctivae are normal.  Neck: Neck supple.  Cardiovascular: Normal rate and regular rhythm.   No murmur heard. Pulmonary/Chest: Effort normal and breath sounds normal. No respiratory distress.  Abdominal: Soft. There is no tenderness.  Musculoskeletal: She exhibits no edema.  Neurological: She is alert and oriented to person, place, and time.  No altered mental status, able to give full seemingly accurate history.  Face is symmetric, EOM's intact, pupils equal and reactive, vision intact, tongue and uvula midline without deviation Upper and Lower extremity motor 5/5, intact pain perception in distal extremities, 2+ reflexes in biceps, patella and achilles tendons. Finger to nose normal, heel to shin normal.   Skin: Skin is warm and dry.  Psychiatric: She has a normal mood and affect.  Nursing note and vitals reviewed.    ED Treatments /  Results  Labs (all labs ordered are listed, but only abnormal results are displayed) Labs Reviewed - No data to display  EKG  EKG Interpretation None       Radiology No results found.  Procedures Procedures (including critical care time)  Medications Ordered in ED Medications - No data to display   Initial Impression / Assessment and Plan / ED Course  I have reviewed the triage vital signs and the nursing notes.  Pertinent labs & imaging results that were available during my care of the patient were reviewed by me and considered in my medical decision making (see chart for details).  Clinical Course    The patient arrived with signs and symptoms consistent with a migraine headache 2/2 possible sinusitis. The patient has history of migraines. This feels like previous migraines.The patient has a reassuring neuro exam lessening chances of intracranial abnormalities. The patient was given decadron, Compazine, Benadryl with patient's pain significantly improved and is ready for discharge. The patient remained in no acute distress, hemodynamically stable with reassuring neuro exam. The patient was then discharged from the Emergency Department with no further acute issues. Recommend follow up with neurologist or PCP within the next week.   Final Clinical Impressions(s) / ED Diagnoses   Final diagnoses:  None    New Prescriptions New Prescriptions   No medications on file     Merrily Pew, MD 10/24/15 1645

## 2015-10-24 NOTE — ED Triage Notes (Addendum)
Pt reports headache since approx 0400 this am (reports hx of migraine and states this feels the same). Denies known fever, sob, n/v/d. Pt also reports nasal congestion and drainage since yesterday. Denies dizziness,light-headedness, numbness/tingling, vision changes. Note: pt is currently receiving chemo for lung cancer.

## 2015-10-25 ENCOUNTER — Other Ambulatory Visit: Payer: Self-pay | Admitting: Family

## 2015-10-25 DIAGNOSIS — C3491 Malignant neoplasm of unspecified part of right bronchus or lung: Secondary | ICD-10-CM

## 2015-10-25 DIAGNOSIS — C7951 Secondary malignant neoplasm of bone: Secondary | ICD-10-CM

## 2015-10-25 DIAGNOSIS — M436 Torticollis: Secondary | ICD-10-CM

## 2015-10-28 ENCOUNTER — Other Ambulatory Visit: Payer: PPO

## 2015-10-28 ENCOUNTER — Other Ambulatory Visit: Payer: Self-pay

## 2015-10-28 ENCOUNTER — Ambulatory Visit: Payer: PPO

## 2015-10-28 ENCOUNTER — Ambulatory Visit: Payer: PPO | Admitting: Hematology & Oncology

## 2015-10-28 DIAGNOSIS — C3490 Malignant neoplasm of unspecified part of unspecified bronchus or lung: Secondary | ICD-10-CM

## 2015-11-02 DIAGNOSIS — J343 Hypertrophy of nasal turbinates: Secondary | ICD-10-CM | POA: Diagnosis not present

## 2015-11-02 DIAGNOSIS — Z87891 Personal history of nicotine dependence: Secondary | ICD-10-CM | POA: Diagnosis not present

## 2015-11-02 DIAGNOSIS — J3801 Paralysis of vocal cords and larynx, unilateral: Secondary | ICD-10-CM | POA: Diagnosis not present

## 2015-11-02 DIAGNOSIS — Z923 Personal history of irradiation: Secondary | ICD-10-CM | POA: Diagnosis not present

## 2015-11-02 DIAGNOSIS — C3491 Malignant neoplasm of unspecified part of right bronchus or lung: Secondary | ICD-10-CM | POA: Diagnosis not present

## 2015-11-02 DIAGNOSIS — J383 Other diseases of vocal cords: Secondary | ICD-10-CM | POA: Diagnosis not present

## 2015-11-02 DIAGNOSIS — R1313 Dysphagia, pharyngeal phase: Secondary | ICD-10-CM | POA: Diagnosis not present

## 2015-11-02 DIAGNOSIS — J384 Edema of larynx: Secondary | ICD-10-CM | POA: Diagnosis not present

## 2015-11-02 DIAGNOSIS — R490 Dysphonia: Secondary | ICD-10-CM | POA: Diagnosis not present

## 2015-11-04 ENCOUNTER — Ambulatory Visit (HOSPITAL_BASED_OUTPATIENT_CLINIC_OR_DEPARTMENT_OTHER): Payer: PPO

## 2015-11-04 ENCOUNTER — Ambulatory Visit (HOSPITAL_BASED_OUTPATIENT_CLINIC_OR_DEPARTMENT_OTHER)
Admission: RE | Admit: 2015-11-04 | Discharge: 2015-11-04 | Disposition: A | Discharge status: Home / Self Care | Payer: PPO | Source: Ambulatory Visit | Attending: Hematology & Oncology | Admitting: Hematology & Oncology

## 2015-11-04 ENCOUNTER — Ambulatory Visit (HOSPITAL_BASED_OUTPATIENT_CLINIC_OR_DEPARTMENT_OTHER): Payer: PPO | Admitting: Hematology & Oncology

## 2015-11-04 ENCOUNTER — Encounter: Payer: Self-pay | Admitting: Hematology & Oncology

## 2015-11-04 ENCOUNTER — Other Ambulatory Visit (HOSPITAL_BASED_OUTPATIENT_CLINIC_OR_DEPARTMENT_OTHER): Payer: PPO

## 2015-11-04 VITALS — BP 128/88 | HR 92 | Temp 98.1°F | Resp 16 | Ht 61.0 in | Wt 130.0 lb

## 2015-11-04 DIAGNOSIS — R0602 Shortness of breath: Secondary | ICD-10-CM | POA: Diagnosis not present

## 2015-11-04 DIAGNOSIS — C3491 Malignant neoplasm of unspecified part of right bronchus or lung: Secondary | ICD-10-CM

## 2015-11-04 DIAGNOSIS — J910 Malignant pleural effusion: Secondary | ICD-10-CM

## 2015-11-04 DIAGNOSIS — Z23 Encounter for immunization: Secondary | ICD-10-CM

## 2015-11-04 DIAGNOSIS — Z5112 Encounter for antineoplastic immunotherapy: Secondary | ICD-10-CM

## 2015-11-04 DIAGNOSIS — C3492 Malignant neoplasm of unspecified part of left bronchus or lung: Secondary | ICD-10-CM

## 2015-11-04 DIAGNOSIS — C3490 Malignant neoplasm of unspecified part of unspecified bronchus or lung: Secondary | ICD-10-CM | POA: Diagnosis not present

## 2015-11-04 DIAGNOSIS — Z5111 Encounter for antineoplastic chemotherapy: Secondary | ICD-10-CM | POA: Diagnosis not present

## 2015-11-04 DIAGNOSIS — C7951 Secondary malignant neoplasm of bone: Secondary | ICD-10-CM

## 2015-11-04 DIAGNOSIS — C779 Secondary and unspecified malignant neoplasm of lymph node, unspecified: Secondary | ICD-10-CM | POA: Diagnosis not present

## 2015-11-04 LAB — CBC WITH DIFFERENTIAL (CANCER CENTER ONLY)
BASO#: 0.1 10*3/uL (ref 0.0–0.2)
BASO%: 2.5 % — AB (ref 0.0–2.0)
EOS%: 2.5 % (ref 0.0–7.0)
Eosinophils Absolute: 0.1 10*3/uL (ref 0.0–0.5)
HEMATOCRIT: 36.8 % (ref 34.8–46.6)
HGB: 11.8 g/dL (ref 11.6–15.9)
LYMPH#: 1.6 10*3/uL (ref 0.9–3.3)
LYMPH%: 31.5 % (ref 14.0–48.0)
MCH: 27.8 pg (ref 26.0–34.0)
MCHC: 32.1 g/dL (ref 32.0–36.0)
MCV: 87 fL (ref 81–101)
MONO#: 0.6 10*3/uL (ref 0.1–0.9)
MONO%: 12.2 % (ref 0.0–13.0)
NEUT#: 2.7 10*3/uL (ref 1.5–6.5)
NEUT%: 51.3 % (ref 39.6–80.0)
PLATELETS: 233 10*3/uL (ref 145–400)
RBC: 4.25 10*6/uL (ref 3.70–5.32)
RDW: 18 % — AB (ref 11.1–15.7)
WBC: 5.2 10*3/uL (ref 3.9–10.0)

## 2015-11-04 LAB — CMP (CANCER CENTER ONLY)
ALK PHOS: 100 U/L — AB (ref 26–84)
ALT: 19 U/L (ref 10–47)
AST: 23 U/L (ref 11–38)
Albumin: 3.5 g/dL (ref 3.3–5.5)
BILIRUBIN TOTAL: 0.6 mg/dL (ref 0.20–1.60)
BUN: 15 mg/dL (ref 7–22)
CO2: 25 mEq/L (ref 18–33)
Calcium: 8.3 mg/dL (ref 8.0–10.3)
Chloride: 106 mEq/L (ref 98–108)
Creat: 0.7 mg/dl (ref 0.6–1.2)
GLUCOSE: 82 mg/dL (ref 73–118)
POTASSIUM: 3.9 meq/L (ref 3.3–4.7)
Sodium: 137 mEq/L (ref 128–145)
TOTAL PROTEIN: 7 g/dL (ref 6.4–8.1)

## 2015-11-04 LAB — LACTATE DEHYDROGENASE: LDH: 176 U/L (ref 125–245)

## 2015-11-04 LAB — TECHNOLOGIST REVIEW CHCC SATELLITE

## 2015-11-04 LAB — UA PROTEIN, DIPSTICK - CHCC SATELLITE: Protein, Urine: NEGATIVE mg/dL

## 2015-11-04 MED ORDER — PACLITAXEL PROTEIN-BOUND CHEMO INJECTION 100 MG
81.0000 mg/m2 | Freq: Once | INTRAVENOUS | Status: AC
  Administered 2015-11-04: 125 mg via INTRAVENOUS
  Filled 2015-11-04: qty 25

## 2015-11-04 MED ORDER — PROCHLORPERAZINE MALEATE 10 MG PO TABS
ORAL_TABLET | ORAL | Status: AC
  Filled 2015-11-04: qty 1

## 2015-11-04 MED ORDER — SODIUM CHLORIDE 0.9 % IV SOLN
Freq: Once | INTRAVENOUS | Status: AC
  Administered 2015-11-04: 10:00:00 via INTRAVENOUS

## 2015-11-04 MED ORDER — SODIUM CHLORIDE 0.9 % IV SOLN
600.0000 mg | Freq: Once | INTRAVENOUS | Status: AC
  Administered 2015-11-04: 600 mg via INTRAVENOUS
  Filled 2015-11-04: qty 16

## 2015-11-04 MED ORDER — SODIUM CHLORIDE 0.9% FLUSH
10.0000 mL | INTRAVENOUS | Status: DC | PRN
  Administered 2015-11-04: 10 mL
  Filled 2015-11-04: qty 10

## 2015-11-04 MED ORDER — HEPARIN SOD (PORK) LOCK FLUSH 100 UNIT/ML IV SOLN
500.0000 [IU] | Freq: Once | INTRAVENOUS | Status: AC | PRN
  Administered 2015-11-04: 500 [IU]
  Filled 2015-11-04: qty 5

## 2015-11-04 MED ORDER — INFLUENZA VAC SPLIT QUAD 0.5 ML IM SUSY
0.5000 mL | PREFILLED_SYRINGE | Freq: Once | INTRAMUSCULAR | Status: AC
  Administered 2015-11-04: 0.5 mL via INTRAMUSCULAR
  Filled 2015-11-04: qty 0.5

## 2015-11-04 MED ORDER — PROCHLORPERAZINE MALEATE 10 MG PO TABS
10.0000 mg | ORAL_TABLET | Freq: Once | ORAL | Status: AC
  Administered 2015-11-04: 10 mg via ORAL

## 2015-11-04 NOTE — Patient Instructions (Signed)
[facility] Discharge Instructions for Patients Receiving Chemotherapy  Today you received the following chemotherapy agents Abraxane and Avastin    To help prevent nausea and vomiting after your treatment, we encourage you to take your nausea medication    If you develop nausea and vomiting that is not controlled by your nausea medication, call the clinic.   BELOW ARE SYMPTOMS THAT SHOULD BE REPORTED IMMEDIATELY:  *FEVER GREATER THAN 100.5 F  *CHILLS WITH OR WITHOUT FEVER  NAUSEA AND VOMITING THAT IS NOT CONTROLLED WITH YOUR NAUSEA MEDICATION  *UNUSUAL SHORTNESS OF BREATH  *UNUSUAL BRUISING OR BLEEDING  TENDERNESS IN MOUTH AND THROAT WITH OR WITHOUT PRESENCE OF ULCERS  *URINARY PROBLEMS  *BOWEL PROBLEMS  UNUSUAL RASH Items with * indicate a potential emergency and should be followed up as soon as possible.  Feel free to call the clinic you have any questions or concerns. The clinic phone number is [phone].  Please show the [place] at check-in to the Emergency Department and triage nurse.

## 2015-11-04 NOTE — Progress Notes (Signed)
This is a  Hematology and Oncology Follow Up Visit  [name] [id number] 04-09-1950 65 y.o. 11/04/2015   Principle Diagnosis:  Metastatic adenocarcinoma the lung - bone metastases, lymph node metastasis and intrapulmonary metastasis Malignant left pleural effusion  Current Therapy:  AbraxaneAvastin -s/p cycle #5 Navelbine/Avastin q 2wk dosing - s/p c#4 - progression  Tecentriq s/p cycle 4 Xgeva 120 mg subcutaneous every month Palliative radiation therapy to the neck.    Interim History:  Ms. Hulet is here today with her husband. She is doing okay. She's having neck pain. She has a lot of arthritis in the neck. She's had past radiation therapy to the neck.  She did see Dr. Redmond Baseman of ENT. To  no surprise, she has a paralyzed vocal cord and I think on the right side. Dr. Redmond Baseman will try to fix this. I think it would be a good area to try to fix this.   She's had no cough. She's had some abdominal discomfort. She does have a hiatal hernia.   There's not been any problems with nausea or vomiting. She's had no change in bowel or bladder habits.   She has had no bleeding issues.   Thank you, her great granddaughter is doing okay.   Overall, her performance status is ECOG 1.    Medications:    Medication List       Accurate as of 11/04/15  9:41 AM. Always use your most recent med list.          aspirin 81 MG tablet Take 81 mg by mouth daily.   budesonide-formoterol 160-4.5 MCG/ACT inhaler Commonly known as:  SYMBICORT Inhale 2 puffs into the lungs 2 (two) times daily as needed (SOB, wheezing).   calcium-vitamin D 250-125 MG-UNIT tablet Commonly known as:  OSCAL Take 1 tablet by mouth daily.   cyclobenzaprine 5 MG tablet Commonly known as:  FLEXERIL TAKE 1 TABLET (5 MG TOTAL) BY MOUTH 3 (THREE) TIMES DAILY AS NEEDED FOR MUSCLE SPASMS.   fish oil-omega-3 fatty acids 1000 MG capsule Take 1 g by mouth daily.   folic acid 1 MG tablet Commonly known as:   FOLVITE Take 1 mg by mouth daily.   KLOR-CON M20 20 MEQ tablet Generic drug:  potassium chloride SA TAKE 1 TABLET BY MOUTH DAILY   lactulose 10 GM/15ML solution Commonly known as:  CHRONULAC Take 10 g by mouth daily. Reported on 07/22/2015   lidocaine-prilocaine cream Commonly known as:  EMLA Apply 1 application topically daily as needed (pain).   omeprazole 40 MG capsule Commonly known as:  PRILOSEC Take 40 mg by mouth 2 (two) times daily.   OxyCODONE HCl (Abuse Deter) 5 MG Taba Commonly known as:  OXAYDO Take 5 mg by mouth every 4 (four) hours as needed (pain).   oxyCODONE 10 mg 12 hr tablet Commonly known as:  OXYCONTIN Take 1 tablet (10 mg total) by mouth every 12 (twelve) hours.   polyethylene glycol powder powder Commonly known as:  GLYCOLAX/MIRALAX Take 17 g by mouth once. Dissolve 1 capful of powder into any liquid and take once daily. Can increase to 2 times daily if no effect after 3-4 days.   PROAIR RESPICLICK 119 (90 Base) MCG/ACT Aepb Generic drug:  Albuterol Sulfate Inhale 2 puffs into the lungs twice daily as needed for shortness of breath or wheezing   prochlorperazine 10 MG tablet Commonly known as:  COMPAZINE Take 10 mg by mouth every 6 (six) hours as needed for nausea or vomiting.  senna-docusate 8.6-50 MG tablet Commonly known as:  Senokot-S Take 1 tablet by mouth daily.   SUMAtriptan 100 MG tablet Commonly known as:  IMITREX Take 100 mg by mouth as needed for migraine or headache. Reported on 04/01/2015       Allergies:  Allergies  Allergen Reactions  . Demerol Other (See Comments)    Syncope  . Mefoxin [Cefoxitin Sodium In Dextrose]     rash  . Topamax Other (See Comments)    Had trouble walking  . Ciprofloxacin Other (See Comments)  . Avelox [Moxifloxacin Hcl In Nacl]     Hypotension.    Past Medical History, Surgical history, Social history, and Family History were reviewed and updated.  Review of Systems: All other 10 point  review of systems is negative.   Physical Exam:  height is '5\' 1"'$  (1.549 m) and weight is 130 lb (59 kg). Her oral temperature is 98.1 F (36.7 C). Her blood pressure is 128/88 and her pulse is 92. Her respiration is 16.   Wt Readings from Last 3 Encounters:  11/04/15 130 lb (59 kg)  10/24/15 130 lb (59 kg)  10/21/15 130 lb 12 oz (59.3 kg)    Well-developed and well-nourished white female in no obvious distress. Head and neck exam shows no ocular or oral lesions. There are no palpable cervical or supraclavicular lymph nodes. Lungs are clear on the right side. There is still some slight decrease breath sounds on the left side. Cardiac exam regular rate and rhythm with no murmurs, rubs or bruits. Abdomen is soft. She is good bowel sounds. There is no fluid wave. There is no palpable liver or spleen tip. Back exam shows no tenderness over the spine, ribs or hips. Extremities shows no clubbing, cyanosis or edema. Skin exam shows no rashes, ecchymoses or petechia. Neurological exam shows no focal neurological deficits.   Lab Results  Component Value Date   WBC 5.2 11/04/2015   HGB 11.8 11/04/2015   HCT 36.8 11/04/2015   MCV 87 11/04/2015   PLT 233 11/04/2015   Lab Results  Component Value Date   FERRITIN 47 10/29/2013   IRON 64 10/29/2013   TIBC 233 (L) 10/29/2013   UIBC 169 10/29/2013   IRONPCTSAT 27 10/29/2013   Lab Results  Component Value Date   RETICCTPCT 1.4 08/13/2008   RBC 4.25 11/04/2015   RETICCTABS 61.6 08/13/2008   No results found for: KPAFRELGTCHN, LAMBDASER, KAPLAMBRATIO No results found for: IGGSERUM, IGA, IGMSERUM No results found for: Odetta Pink, SPEI   Chemistry      Component Value Date/Time   NA 137 11/04/2015 0803   NA 140 07/22/2015 1255   K 3.9 11/04/2015 0803   K 4.0 07/22/2015 1255   CL 106 11/04/2015 0803   CO2 25 11/04/2015 0803   CO2 27 07/22/2015 1255   BUN 15 11/04/2015 0803   BUN 9.8  07/22/2015 1255   CREATININE 0.7 11/04/2015 0803   CREATININE 0.6 07/22/2015 1255      Component Value Date/Time   CALCIUM 8.3 11/04/2015 0803   CALCIUM 9.1 07/22/2015 1255   ALKPHOS 100 (H) 11/04/2015 0803   AST 23 11/04/2015 0803   ALT 19 11/04/2015 0803   BILITOT 0.60 11/04/2015 0803     Impression and Plan: Ms. Schriner is a pleasant 65 yo white female with metastatic adenocarcinoma of the lung. She has no "Driver" mutations that we can target.  We will go ahead and get her  set up with her 6th cycle of treatment. I think she's done pretty well with the Alimta/Avastin combination. She seems to be tolerating it pretty well.   It is no surprise that she has vocal cord paralysis on the right side. Hopefully, Dr. Redmond Baseman of ENT will be able to help with this and injected.  I slain her how this happened. I told her that it was because of swollen lymph nodes in her chest where the nerve that controls her vocal cord runs and then goes up to the vocal cord.   We will get her PET scan.  I will plan to see her back probably in about 3 weeks. I will give her next week off.    Volanda Napoleon, MD 9/27/20179:41 AM

## 2015-11-04 NOTE — Addendum Note (Signed)
Addended by: Burney Gauze R on: 11/04/2015 11:58 AM   Modules accepted: Orders

## 2015-11-18 ENCOUNTER — Ambulatory Visit (HOSPITAL_COMMUNITY)
Admission: RE | Admit: 2015-11-18 | Discharge: 2015-11-18 | Disposition: A | Discharge status: Home / Self Care | Payer: PPO | Source: Ambulatory Visit | Attending: Hematology & Oncology | Admitting: Hematology & Oncology

## 2015-11-18 DIAGNOSIS — R590 Localized enlarged lymph nodes: Secondary | ICD-10-CM | POA: Insufficient documentation

## 2015-11-18 DIAGNOSIS — C7951 Secondary malignant neoplasm of bone: Secondary | ICD-10-CM | POA: Insufficient documentation

## 2015-11-18 DIAGNOSIS — R918 Other nonspecific abnormal finding of lung field: Secondary | ICD-10-CM | POA: Diagnosis not present

## 2015-11-18 DIAGNOSIS — J910 Malignant pleural effusion: Secondary | ICD-10-CM

## 2015-11-18 DIAGNOSIS — Z23 Encounter for immunization: Secondary | ICD-10-CM

## 2015-11-18 DIAGNOSIS — C3492 Malignant neoplasm of unspecified part of left bronchus or lung: Secondary | ICD-10-CM | POA: Diagnosis not present

## 2015-11-18 DIAGNOSIS — C3491 Malignant neoplasm of unspecified part of right bronchus or lung: Secondary | ICD-10-CM | POA: Insufficient documentation

## 2015-11-18 LAB — GLUCOSE, CAPILLARY: Glucose-Capillary: 86 mg/dL (ref 65–99)

## 2015-11-18 MED ORDER — FLUDEOXYGLUCOSE F - 18 (FDG) INJECTION
6.4300 | Freq: Once | INTRAVENOUS | Status: AC | PRN
  Administered 2015-11-18: 6.43 via INTRAVENOUS

## 2015-11-19 ENCOUNTER — Other Ambulatory Visit: Payer: Self-pay | Admitting: Otolaryngology

## 2015-11-24 ENCOUNTER — Encounter (HOSPITAL_COMMUNITY): Payer: Self-pay

## 2015-11-24 ENCOUNTER — Encounter (HOSPITAL_COMMUNITY)
Admission: RE | Admit: 2015-11-24 | Discharge: 2015-11-24 | Disposition: A | Discharge status: Home / Self Care | Payer: PPO | Source: Ambulatory Visit | Attending: Otolaryngology | Admitting: Otolaryngology

## 2015-11-24 DIAGNOSIS — F419 Anxiety disorder, unspecified: Secondary | ICD-10-CM | POA: Diagnosis not present

## 2015-11-24 DIAGNOSIS — Z888 Allergy status to other drugs, medicaments and biological substances status: Secondary | ICD-10-CM | POA: Diagnosis not present

## 2015-11-24 DIAGNOSIS — Z87891 Personal history of nicotine dependence: Secondary | ICD-10-CM | POA: Diagnosis not present

## 2015-11-24 DIAGNOSIS — E559 Vitamin D deficiency, unspecified: Secondary | ICD-10-CM | POA: Diagnosis not present

## 2015-11-24 DIAGNOSIS — Z923 Personal history of irradiation: Secondary | ICD-10-CM | POA: Diagnosis not present

## 2015-11-24 DIAGNOSIS — J3801 Paralysis of vocal cords and larynx, unilateral: Secondary | ICD-10-CM | POA: Diagnosis not present

## 2015-11-24 DIAGNOSIS — Z885 Allergy status to narcotic agent status: Secondary | ICD-10-CM | POA: Diagnosis not present

## 2015-11-24 DIAGNOSIS — Z853 Personal history of malignant neoplasm of breast: Secondary | ICD-10-CM | POA: Diagnosis not present

## 2015-11-24 DIAGNOSIS — E538 Deficiency of other specified B group vitamins: Secondary | ICD-10-CM | POA: Diagnosis not present

## 2015-11-24 DIAGNOSIS — R490 Dysphonia: Secondary | ICD-10-CM | POA: Diagnosis not present

## 2015-11-24 DIAGNOSIS — F329 Major depressive disorder, single episode, unspecified: Secondary | ICD-10-CM | POA: Diagnosis not present

## 2015-11-24 DIAGNOSIS — C799 Secondary malignant neoplasm of unspecified site: Secondary | ICD-10-CM | POA: Diagnosis not present

## 2015-11-24 DIAGNOSIS — K219 Gastro-esophageal reflux disease without esophagitis: Secondary | ICD-10-CM | POA: Diagnosis not present

## 2015-11-24 DIAGNOSIS — Z881 Allergy status to other antibiotic agents status: Secondary | ICD-10-CM | POA: Diagnosis not present

## 2015-11-24 DIAGNOSIS — I10 Essential (primary) hypertension: Secondary | ICD-10-CM | POA: Diagnosis not present

## 2015-11-24 DIAGNOSIS — Z8673 Personal history of transient ischemic attack (TIA), and cerebral infarction without residual deficits: Secondary | ICD-10-CM | POA: Diagnosis not present

## 2015-11-24 DIAGNOSIS — K449 Diaphragmatic hernia without obstruction or gangrene: Secondary | ICD-10-CM | POA: Diagnosis not present

## 2015-11-24 DIAGNOSIS — J449 Chronic obstructive pulmonary disease, unspecified: Secondary | ICD-10-CM | POA: Diagnosis not present

## 2015-11-24 DIAGNOSIS — Z9221 Personal history of antineoplastic chemotherapy: Secondary | ICD-10-CM | POA: Diagnosis not present

## 2015-11-24 DIAGNOSIS — C3490 Malignant neoplasm of unspecified part of unspecified bronchus or lung: Secondary | ICD-10-CM | POA: Diagnosis not present

## 2015-11-24 HISTORY — DX: Headache, unspecified: R51.9

## 2015-11-24 HISTORY — DX: Headache: R51

## 2015-11-24 LAB — BASIC METABOLIC PANEL
ANION GAP: 8 (ref 5–15)
BUN: 12 mg/dL (ref 6–20)
CALCIUM: 9.3 mg/dL (ref 8.9–10.3)
CO2: 25 mmol/L (ref 22–32)
Chloride: 107 mmol/L (ref 101–111)
Creatinine, Ser: 0.63 mg/dL (ref 0.44–1.00)
Glucose, Bld: 97 mg/dL (ref 65–99)
POTASSIUM: 3.6 mmol/L (ref 3.5–5.1)
Sodium: 140 mmol/L (ref 135–145)

## 2015-11-24 LAB — CBC
HEMATOCRIT: 39.6 % (ref 36.0–46.0)
HEMOGLOBIN: 12.4 g/dL (ref 12.0–15.0)
MCH: 26.6 pg (ref 26.0–34.0)
MCHC: 31.3 g/dL (ref 30.0–36.0)
MCV: 85 fL (ref 78.0–100.0)
Platelets: 247 10*3/uL (ref 150–400)
RBC: 4.66 MIL/uL (ref 3.87–5.11)
RDW: 18 % — ABNORMAL HIGH (ref 11.5–15.5)
WBC: 7.8 10*3/uL (ref 4.0–10.5)

## 2015-11-24 NOTE — Pre-Procedure Instructions (Signed)
    Zanyla Klebba Arnall  11/24/2015      [id number] [place], [place] - [address]: [phone]: [phone] [place] [place] [address]Phone: [phone]: [phone]   Your procedure is scheduled on  November 27, 2015.  [facility]  Call this number if you have problems the morning of surgery:  [phone]   Remember:  Do not eat food or drink liquids after midnight.  Take these medicines the morning of surgery with A SIP OF WATER :SYMBICORT,PROAIR- bring inhalers with you , FLEXERIL, HYCODAN, PRILOSEC, A PAIN PILL, COMPAZINE  STOP aspirin, advil (ibuprofen), aleve (naproxen), herbal medications one week prior to surgery  Do not wear jewelry, make-up or nail polish.  Do not wear lotions, powders, or perfumes, or deoderant.  Do not shave 48 hours prior to surgery.  Men may shave face and neck.  Do not bring valuables to the hospital.  [facility] belongings or [facility]ables.  Contacts, dentures or bridgework may not be worn into surgery.  Leave your suitcase in the car.  After surgery it may be brought to your room.  For patients admitted to the hospital, discharge time will be determined by your treatment team.  Patients discharged the day of surgery will not be allowed to drive home.   Name and phone number of your driver:    Special instructions:  Preparing for Surgery  Please read over the fact sheets that you were given.

## 2015-11-25 ENCOUNTER — Encounter: Payer: Self-pay | Admitting: Hematology & Oncology

## 2015-11-25 ENCOUNTER — Ambulatory Visit (HOSPITAL_BASED_OUTPATIENT_CLINIC_OR_DEPARTMENT_OTHER): Payer: PPO | Admitting: Hematology & Oncology

## 2015-11-25 ENCOUNTER — Ambulatory Visit (HOSPITAL_BASED_OUTPATIENT_CLINIC_OR_DEPARTMENT_OTHER): Payer: PPO

## 2015-11-25 ENCOUNTER — Other Ambulatory Visit (HOSPITAL_BASED_OUTPATIENT_CLINIC_OR_DEPARTMENT_OTHER): Payer: PPO

## 2015-11-25 VITALS — BP 134/88 | HR 96 | Temp 97.8°F | Resp 16 | Ht 61.0 in | Wt 134.0 lb

## 2015-11-25 DIAGNOSIS — J910 Malignant pleural effusion: Secondary | ICD-10-CM

## 2015-11-25 DIAGNOSIS — C3490 Malignant neoplasm of unspecified part of unspecified bronchus or lung: Secondary | ICD-10-CM

## 2015-11-25 DIAGNOSIS — Z5111 Encounter for antineoplastic chemotherapy: Secondary | ICD-10-CM

## 2015-11-25 DIAGNOSIS — C3492 Malignant neoplasm of unspecified part of left bronchus or lung: Secondary | ICD-10-CM

## 2015-11-25 DIAGNOSIS — C342 Malignant neoplasm of middle lobe, bronchus or lung: Secondary | ICD-10-CM

## 2015-11-25 DIAGNOSIS — C7951 Secondary malignant neoplasm of bone: Secondary | ICD-10-CM

## 2015-11-25 DIAGNOSIS — C3491 Malignant neoplasm of unspecified part of right bronchus or lung: Secondary | ICD-10-CM

## 2015-11-25 DIAGNOSIS — Z23 Encounter for immunization: Secondary | ICD-10-CM

## 2015-11-25 LAB — CMP (CANCER CENTER ONLY)
ALBUMIN: 3.5 g/dL (ref 3.3–5.5)
ALT(SGPT): 20 U/L (ref 10–47)
AST: 20 U/L (ref 11–38)
Alkaline Phosphatase: 95 U/L — ABNORMAL HIGH (ref 26–84)
BUN, Bld: 13 mg/dL (ref 7–22)
CALCIUM: 8.6 mg/dL (ref 8.0–10.3)
CHLORIDE: 107 meq/L (ref 98–108)
CO2: 25 meq/L (ref 18–33)
Creat: 0.5 mg/dl — ABNORMAL LOW (ref 0.6–1.2)
Glucose, Bld: 98 mg/dL (ref 73–118)
POTASSIUM: 3.4 meq/L (ref 3.3–4.7)
SODIUM: 136 meq/L (ref 128–145)
Total Bilirubin: 0.5 mg/dl (ref 0.20–1.60)
Total Protein: 6.7 g/dL (ref 6.4–8.1)

## 2015-11-25 LAB — CBC WITH DIFFERENTIAL (CANCER CENTER ONLY)
BASO#: 0.1 10*3/uL (ref 0.0–0.2)
BASO%: 1.2 % (ref 0.0–2.0)
EOS ABS: 0.2 10*3/uL (ref 0.0–0.5)
EOS%: 3.6 % (ref 0.0–7.0)
HEMATOCRIT: 36.3 % (ref 34.8–46.6)
HEMOGLOBIN: 12 g/dL (ref 11.6–15.9)
LYMPH#: 1.7 10*3/uL (ref 0.9–3.3)
LYMPH%: 26.7 % (ref 14.0–48.0)
MCH: 28.2 pg (ref 26.0–34.0)
MCHC: 33.1 g/dL (ref 32.0–36.0)
MCV: 85 fL (ref 81–101)
MONO#: 0.9 10*3/uL (ref 0.1–0.9)
MONO%: 13.3 % — AB (ref 0.0–13.0)
NEUT%: 55.2 % (ref 39.6–80.0)
NEUTROS ABS: 3.5 10*3/uL (ref 1.5–6.5)
Platelets: 229 10*3/uL (ref 145–400)
RBC: 4.25 10*6/uL (ref 3.70–5.32)
RDW: 18.3 % — ABNORMAL HIGH (ref 11.1–15.7)
WBC: 6.4 10*3/uL (ref 3.9–10.0)

## 2015-11-25 MED ORDER — PROCHLORPERAZINE MALEATE 10 MG PO TABS
ORAL_TABLET | ORAL | Status: AC
  Filled 2015-11-25: qty 1

## 2015-11-25 MED ORDER — PACLITAXEL PROTEIN-BOUND CHEMO INJECTION 100 MG
81.0000 mg/m2 | Freq: Once | INTRAVENOUS | Status: AC
  Administered 2015-11-25: 125 mg via INTRAVENOUS
  Filled 2015-11-25: qty 25

## 2015-11-25 MED ORDER — SODIUM CHLORIDE 0.9 % IV SOLN
Freq: Once | INTRAVENOUS | Status: AC
  Administered 2015-11-25: 09:00:00 via INTRAVENOUS

## 2015-11-25 MED ORDER — PROCHLORPERAZINE MALEATE 10 MG PO TABS
10.0000 mg | ORAL_TABLET | Freq: Once | ORAL | Status: AC
  Administered 2015-11-25: 10 mg via ORAL

## 2015-11-25 MED ORDER — DENOSUMAB 120 MG/1.7ML ~~LOC~~ SOLN
120.0000 mg | Freq: Once | SUBCUTANEOUS | Status: AC
  Administered 2015-11-25: 120 mg via SUBCUTANEOUS
  Filled 2015-11-25: qty 1.7

## 2015-11-25 MED ORDER — HEPARIN SOD (PORK) LOCK FLUSH 100 UNIT/ML IV SOLN
500.0000 [IU] | Freq: Once | INTRAVENOUS | Status: AC | PRN
  Administered 2015-11-25: 500 [IU]
  Filled 2015-11-25: qty 5

## 2015-11-25 MED ORDER — SODIUM CHLORIDE 0.9% FLUSH
10.0000 mL | INTRAVENOUS | Status: DC | PRN
  Administered 2015-11-25: 10 mL
  Filled 2015-11-25: qty 10

## 2015-11-25 NOTE — Patient Instructions (Signed)
[facility] Discharge Instructions for Patients Receiving Chemotherapy  Today you received the following chemotherapy agents Abraxane and xgeva   To help prevent nausea and vomiting after your treatment, we encourage you to take your nausea medication    If you develop nausea and vomiting that is not controlled by your nausea medication, call the clinic.   BELOW ARE SYMPTOMS THAT SHOULD BE REPORTED IMMEDIATELY:  *FEVER GREATER THAN 100.5 F  *CHILLS WITH OR WITHOUT FEVER  NAUSEA AND VOMITING THAT IS NOT CONTROLLED WITH YOUR NAUSEA MEDICATION  *UNUSUAL SHORTNESS OF BREATH  *UNUSUAL BRUISING OR BLEEDING  TENDERNESS IN MOUTH AND THROAT WITH OR WITHOUT PRESENCE OF ULCERS  *URINARY PROBLEMS  *BOWEL PROBLEMS  UNUSUAL RASH Items with * indicate a potential emergency and should be followed up as soon as possible.  Feel free to call the clinic you have any questions or concerns. The clinic phone number is [phone].  Please show the [place] at check-in to the Emergency Department and triage nurse.

## 2015-11-25 NOTE — Progress Notes (Signed)
This is a  Hematology and Oncology Follow Up Visit  [name] [id number] 09/11/1950 65 y.o. 11/25/2015   Principle Diagnosis:  Metastatic adenocarcinoma the lung - bone metastases, lymph node metastasis and intrapulmonary metastasis Malignant left pleural effusion  Current Therapy:  AbraxaneAvastin -s/p cycle #6 Navelbine/Avastin q 2wk dosing - s/p c#4 - progression  Tecentriq s/p cycle 4 Xgeva 120 mg subcutaneous every month Palliative radiation therapy to the neck.    Interim History:  [name] is here today with her husband. She is doing okay. She's having neck pain. She has a lot of arthritis in the neck. She's had past radiation therapy to the neck.  She did see Dr. Redmond [name] of ENT. He was a vocal cord injection this Friday.  She did have a PET scan done. This is done on October 11. The PET scan basically showed stable disease.  She's had no cough. She's had some abdominal discomfort. She does have a hiatal hernia.   She does have some discomfort with the left arm. She has had this before. There is no weakness.  There's not been any problems with nausea or vomiting. She's had no change in bowel or bladder habits.   She has had no bleeding issues.   Thankfully, her great granddaughter is doing okay.   Overall, her performance status is ECOG 1.    Medications:    Medication List       Accurate as of 11/25/15  8:47 AM. Always use your most recent med list.          budesonide-formoterol 160-4.5 MCG/ACT inhaler Commonly known as:  SYMBICORT Inhale 2 puffs into the lungs 2 (two) times daily as needed (SOB, wheezing).   cyclobenzaprine 5 MG tablet Commonly known as:  FLEXERIL TAKE 1 TABLET (5 MG TOTAL) BY MOUTH 3 (THREE) TIMES DAILY AS NEEDED FOR MUSCLE SPASMS.   diphenhydramine-acetaminophen 25-500 MG Tabs tablet Commonly known as:  TYLENOL PM Take 1 tablet by mouth at bedtime as needed (sleep).   HYDROcodone-homatropine 5-1.5 MG/5ML syrup Commonly  known as:  HYCODAN Take 5 mLs by mouth every 6 (six) hours as needed for cough.   ibuprofen 200 MG tablet Commonly known as:  ADVIL,MOTRIN Take 600 mg by mouth every 6 (six) hours as needed for mild pain.   KLOR-CON M20 20 MEQ tablet Generic drug:  potassium chloride SA TAKE 1 TABLET BY MOUTH DAILY   lactulose 10 GM/15ML solution Commonly known as:  CHRONULAC Take 10 g by mouth daily. Reported on 07/22/2015   lidocaine-prilocaine cream Commonly known as:  EMLA Apply 1 application topically daily as needed (pain).   loratadine 10 MG tablet Commonly known as:  CLARITIN Take 10 mg by mouth daily as needed for allergies.   omeprazole 40 MG capsule Commonly known as:  PRILOSEC Take 40 mg by mouth daily as needed (heartburn).   OxyCODONE HCl (Abuse Deter) 5 MG Taba Commonly known as:  OXAYDO Take 5 mg by mouth every 4 (four) hours as needed (pain).   oxyCODONE 10 mg 12 hr tablet Commonly known as:  OXYCONTIN Take 1 tablet (10 mg total) by mouth every 12 (twelve) hours.   polyethylene glycol powder powder Commonly known as:  GLYCOLAX/MIRALAX Take 17 g by mouth once. Dissolve 1 capful of powder into any liquid and take once daily. Can increase to 2 times daily if no effect after 3-4 days.   PROAIR RESPICLICK 510 (90 Base) MCG/ACT Aepb Generic drug:  Albuterol Sulfate Inhale 2 puffs  into the lungs twice daily as needed for shortness of breath or wheezing   prochlorperazine 10 MG tablet Commonly known as:  COMPAZINE Take 10 mg by mouth every 6 (six) hours as needed for nausea or vomiting.   senna-docusate 8.6-50 MG tablet Commonly known as:  Senokot-S Take 1 tablet by mouth daily.   SUMAtriptan 100 MG tablet Commonly known as:  IMITREX Take 100 mg by mouth as needed for migraine or headache. Reported on 04/01/2015       Allergies:  Allergies  Allergen Reactions  . Demerol Other (See Comments)    Syncope Passed out  . Mefoxin [Cefoxitin Sodium In Dextrose]     rash   . Topamax Other (See Comments)    Had trouble walking  . Ciprofloxacin Other (See Comments)    unknown  . Avelox [Moxifloxacin Hcl In Nacl]     Hypotension.    Past Medical History, Surgical history, Social history, and Family History were reviewed and updated.  Review of Systems: All other 10 point review of systems is negative.   Physical Exam:  height is '5\' 1"'$  (1.549 m) and weight is 134 lb (60.8 kg). Her oral temperature is 97.8 F (36.6 C). Her blood pressure is 134/88 and her pulse is 96. Her respiration is 16.   Wt Readings from Last 3 Encounters:  11/25/15 134 lb (60.8 kg)  11/24/15 132 lb 11.2 oz (60.2 kg)  11/04/15 130 lb (59 kg)    Well-developed and well-nourished white female in no obvious distress. Head and neck exam shows no ocular or oral lesions. There are no palpable cervical or supraclavicular lymph nodes. Lungs are clear on the right side. There is still some slight decrease breath sounds on the left side. Cardiac exam regular rate and rhythm with no murmurs, rubs or bruits. Abdomen is soft. She is good bowel sounds. There is no fluid wave. There is no palpable liver or spleen tip. Back exam shows no tenderness over the spine, ribs or hips. Extremities shows no clubbing, cyanosis or edema. Skin exam shows no rashes, ecchymoses or petechia. Neurological exam shows no focal neurological deficits.   Lab Results  Component Value Date   WBC 6.4 11/25/2015   HGB 12.0 11/25/2015   HCT 36.3 11/25/2015   MCV 85 11/25/2015   PLT 229 11/25/2015   Lab Results  Component Value Date   FERRITIN 47 10/29/2013   IRON 64 10/29/2013   TIBC 233 (L) 10/29/2013   UIBC 169 10/29/2013   IRONPCTSAT 27 10/29/2013   Lab Results  Component Value Date   RETICCTPCT 1.4 08/13/2008   RBC 4.25 11/25/2015   RETICCTABS 61.6 08/13/2008   No results found for: KPAFRELGTCHN, LAMBDASER, KAPLAMBRATIO No results found for: IGGSERUM, IGA, IGMSERUM No results found for: Odetta Pink, SPEI   Chemistry      Component Value Date/Time   NA 136 11/25/2015 0744   NA 140 07/22/2015 1255   K 3.4 11/25/2015 0744   K 4.0 07/22/2015 1255   CL 107 11/25/2015 0744   CO2 25 11/25/2015 0744   CO2 27 07/22/2015 1255   BUN 13 11/25/2015 0744   BUN 9.8 07/22/2015 1255   CREATININE 0.5 (L) 11/25/2015 0744   CREATININE 0.6 07/22/2015 1255      Component Value Date/Time   CALCIUM 8.6 11/25/2015 0744   CALCIUM 9.1 07/22/2015 1255   ALKPHOS 95 (H) 11/25/2015 0744   AST 20 11/25/2015 0744   ALT  20 11/25/2015 0744   BILITOT 0.50 11/25/2015 0744     Impression and Plan: Ms. Martha is a pleasant 65 yo white female with metastatic adenocarcinoma of the lung. She has no "Driver" mutations that we can target.  I am very glad that the PET scan did not show any evidence of progressive disease. To me, everything looks quite stable. As such, I think we will continue with our protocol.   I am going to hold the Avastin today.   We will do 4 more cycles and then repeat the PET scan.   I know that the vocal cord injection will help her voice.    Volanda Napoleon, MD 10/18/20178:47 AM

## 2015-11-27 ENCOUNTER — Encounter (HOSPITAL_COMMUNITY)
Admission: RE | Disposition: A | Discharge status: Home / Self Care | Payer: Self-pay | Source: Ambulatory Visit | Attending: Otolaryngology

## 2015-11-27 ENCOUNTER — Encounter (HOSPITAL_COMMUNITY): Payer: Self-pay | Admitting: Certified Registered Nurse Anesthetist

## 2015-11-27 ENCOUNTER — Ambulatory Visit (HOSPITAL_COMMUNITY): Payer: PPO | Admitting: Certified Registered Nurse Anesthetist

## 2015-11-27 ENCOUNTER — Ambulatory Visit (HOSPITAL_COMMUNITY)
Admission: RE | Admit: 2015-11-27 | Discharge: 2015-11-27 | Disposition: A | Discharge status: Home / Self Care | Payer: PPO | Source: Ambulatory Visit | Attending: Otolaryngology | Admitting: Otolaryngology

## 2015-11-27 DIAGNOSIS — K449 Diaphragmatic hernia without obstruction or gangrene: Secondary | ICD-10-CM | POA: Insufficient documentation

## 2015-11-27 DIAGNOSIS — E559 Vitamin D deficiency, unspecified: Secondary | ICD-10-CM | POA: Diagnosis not present

## 2015-11-27 DIAGNOSIS — F419 Anxiety disorder, unspecified: Secondary | ICD-10-CM | POA: Insufficient documentation

## 2015-11-27 DIAGNOSIS — Z9221 Personal history of antineoplastic chemotherapy: Secondary | ICD-10-CM | POA: Insufficient documentation

## 2015-11-27 DIAGNOSIS — Z8673 Personal history of transient ischemic attack (TIA), and cerebral infarction without residual deficits: Secondary | ICD-10-CM | POA: Insufficient documentation

## 2015-11-27 DIAGNOSIS — I10 Essential (primary) hypertension: Secondary | ICD-10-CM | POA: Insufficient documentation

## 2015-11-27 DIAGNOSIS — J449 Chronic obstructive pulmonary disease, unspecified: Secondary | ICD-10-CM | POA: Diagnosis not present

## 2015-11-27 DIAGNOSIS — R490 Dysphonia: Secondary | ICD-10-CM | POA: Diagnosis not present

## 2015-11-27 DIAGNOSIS — Z888 Allergy status to other drugs, medicaments and biological substances status: Secondary | ICD-10-CM | POA: Insufficient documentation

## 2015-11-27 DIAGNOSIS — F329 Major depressive disorder, single episode, unspecified: Secondary | ICD-10-CM | POA: Insufficient documentation

## 2015-11-27 DIAGNOSIS — J3801 Paralysis of vocal cords and larynx, unilateral: Secondary | ICD-10-CM | POA: Diagnosis not present

## 2015-11-27 DIAGNOSIS — C799 Secondary malignant neoplasm of unspecified site: Secondary | ICD-10-CM | POA: Insufficient documentation

## 2015-11-27 DIAGNOSIS — Z853 Personal history of malignant neoplasm of breast: Secondary | ICD-10-CM | POA: Insufficient documentation

## 2015-11-27 DIAGNOSIS — Z87891 Personal history of nicotine dependence: Secondary | ICD-10-CM | POA: Insufficient documentation

## 2015-11-27 DIAGNOSIS — E538 Deficiency of other specified B group vitamins: Secondary | ICD-10-CM | POA: Diagnosis not present

## 2015-11-27 DIAGNOSIS — C3490 Malignant neoplasm of unspecified part of unspecified bronchus or lung: Secondary | ICD-10-CM | POA: Insufficient documentation

## 2015-11-27 DIAGNOSIS — K219 Gastro-esophageal reflux disease without esophagitis: Secondary | ICD-10-CM | POA: Insufficient documentation

## 2015-11-27 DIAGNOSIS — C3491 Malignant neoplasm of unspecified part of right bronchus or lung: Secondary | ICD-10-CM | POA: Diagnosis not present

## 2015-11-27 DIAGNOSIS — Z923 Personal history of irradiation: Secondary | ICD-10-CM | POA: Insufficient documentation

## 2015-11-27 DIAGNOSIS — Z881 Allergy status to other antibiotic agents status: Secondary | ICD-10-CM | POA: Insufficient documentation

## 2015-11-27 DIAGNOSIS — Z885 Allergy status to narcotic agent status: Secondary | ICD-10-CM | POA: Insufficient documentation

## 2015-11-27 HISTORY — PX: MICROLARYNGOSCOPY W/VOCAL CORD INJECTION: SHX2665

## 2015-11-27 SURGERY — MICROLARYNGOSCOPY, WITH VOCAL CORD INJECTION
Anesthesia: General

## 2015-11-27 MED ORDER — ONDANSETRON HCL 4 MG/2ML IJ SOLN
INTRAMUSCULAR | Status: DC | PRN
  Administered 2015-11-27: 4 mg via INTRAVENOUS

## 2015-11-27 MED ORDER — MIDAZOLAM HCL 2 MG/2ML IJ SOLN
INTRAMUSCULAR | Status: AC
  Filled 2015-11-27: qty 2

## 2015-11-27 MED ORDER — DEXAMETHASONE SODIUM PHOSPHATE 10 MG/ML IJ SOLN
INTRAMUSCULAR | Status: AC
  Filled 2015-11-27: qty 1

## 2015-11-27 MED ORDER — FENTANYL CITRATE (PF) 100 MCG/2ML IJ SOLN
INTRAMUSCULAR | Status: AC
  Filled 2015-11-27: qty 2

## 2015-11-27 MED ORDER — GLYCOPYRROLATE 0.2 MG/ML IJ SOLN
INTRAMUSCULAR | Status: DC | PRN
  Administered 2015-11-27 (×2): 0.2 mg via INTRAVENOUS

## 2015-11-27 MED ORDER — SODIUM CHLORIDE 0.9 % IV SOLN
0.0125 ug/kg/min | INTRAVENOUS | Status: AC
  Administered 2015-11-27: .5 ug/kg/min via INTRAVENOUS
  Filled 2015-11-27: qty 1000

## 2015-11-27 MED ORDER — EPHEDRINE SULFATE 50 MG/ML IJ SOLN
INTRAMUSCULAR | Status: DC | PRN
  Administered 2015-11-27: 10 mg via INTRAVENOUS

## 2015-11-27 MED ORDER — PROPOFOL 10 MG/ML IV BOLUS
INTRAVENOUS | Status: AC
  Filled 2015-11-27: qty 20

## 2015-11-27 MED ORDER — LIDOCAINE 2% (20 MG/ML) 5 ML SYRINGE
INTRAMUSCULAR | Status: AC
  Filled 2015-11-27: qty 5

## 2015-11-27 MED ORDER — PROPOFOL 500 MG/50ML IV EMUL
INTRAVENOUS | Status: DC | PRN
  Administered 2015-11-27: 150 ug/kg/min via INTRAVENOUS

## 2015-11-27 MED ORDER — DEXAMETHASONE SODIUM PHOSPHATE 10 MG/ML IJ SOLN
INTRAMUSCULAR | Status: DC | PRN
  Administered 2015-11-27: 10 mg via INTRAVENOUS

## 2015-11-27 MED ORDER — PHENYLEPHRINE HCL 10 MG/ML IJ SOLN
INTRAMUSCULAR | Status: DC | PRN
  Administered 2015-11-27 (×4): 80 ug via INTRAVENOUS

## 2015-11-27 MED ORDER — ONDANSETRON HCL 4 MG/2ML IJ SOLN
INTRAMUSCULAR | Status: AC
  Filled 2015-11-27: qty 2

## 2015-11-27 MED ORDER — MIDAZOLAM HCL 2 MG/2ML IJ SOLN
INTRAMUSCULAR | Status: DC | PRN
  Administered 2015-11-27: 2 mg via INTRAVENOUS

## 2015-11-27 MED ORDER — LIDOCAINE HCL 4 % EX SOLN
CUTANEOUS | Status: DC | PRN
  Administered 2015-11-27: 5 mL via TOPICAL

## 2015-11-27 MED ORDER — LACTATED RINGERS IV SOLN
INTRAVENOUS | Status: DC
  Administered 2015-11-27: 08:00:00 via INTRAVENOUS

## 2015-11-27 SURGICAL SUPPLY — 18 items
CANISTER SUCTION 2500CC (MISCELLANEOUS) ×3 IMPLANT
COVER MAYO STAND STRL (DRAPES) ×3 IMPLANT
COVER TABLE BACK 60X90 (DRAPES) ×3 IMPLANT
CRADLE DONUT ADULT HEAD (MISCELLANEOUS) ×2 IMPLANT
DRAPE PROXIMA HALF (DRAPES) ×3 IMPLANT
GLOVE BIO SURGEON STRL SZ7.5 (GLOVE) ×3 IMPLANT
GOWN STRL REUS W/ TWL LRG LVL3 (GOWN DISPOSABLE) IMPLANT
GOWN STRL REUS W/TWL LRG LVL3 (GOWN DISPOSABLE) ×3
GUARD TEETH (MISCELLANEOUS) ×2 IMPLANT
KIT BASIN OR (CUSTOM PROCEDURE TRAY) ×3 IMPLANT
KIT PROLARN PLUS GEL W/NDL (Prosthesis and Implant ENT) ×3 IMPLANT
KIT ROOM TURNOVER OR (KITS) ×3 IMPLANT
NDL TRANS ORAL INJECTION (NEEDLE) IMPLANT
NEEDLE TRANS ORAL INJECTION (NEEDLE) ×3 IMPLANT
PAD ARMBOARD 7.5X6 YLW CONV (MISCELLANEOUS) ×4 IMPLANT
TOWEL OR 17X24 6PK STRL BLUE (TOWEL DISPOSABLE) ×6 IMPLANT
TUBE CONNECTING 12'X1/4 (SUCTIONS) ×1
TUBE CONNECTING 12X1/4 (SUCTIONS) ×2 IMPLANT

## 2015-11-27 NOTE — H&P (Signed)
[name] is an 65 y.o. female.   Chief Complaint: Left vocal fold paralysis, dysphonia HPI: 65 year old female with metastatic lung cancer still under treatment with hoarseness that was found to be due to left vocal fold paralysis.  She presents to the operating room for surgical management.  Past Medical History:  Diagnosis Date  . B12 deficiency   . Cancer [facility]) 2005 & 2008   Right Upper Lobe (2005) & Left Upper Lobe (2008). Subsequent Nodules treated empirically in 2010 & 2011 w/ XRT & chemo.  Marland Kitchen GERD (gastroesophageal reflux disease)   . Headache   . Hypertension   . Lung cancer, primary, with metastasis from lung to other site [facility]) 04/01/2015  . Pleural effusion, malignant 04/01/2015  . Radiation 07/03/15-07/16/15   C3 spine 30 Gy  . Stroke ([place])   . Vitamin D deficiency     Past Surgical History:  Procedure Laterality Date  . APPENDECTOMY    . BRONCHOSCOPY    . CHOLECYSTECTOMY    . cyst removed from ovary  1968  . HIATAL HERNIA REPAIR  2010  . LUNG REMOVAL, PARTIAL  2005, 2008   R upper lobe and left upper lobe wedge  . MEDIASTINOSCOPY    . VAGINAL HYSTERECTOMY  1983    Family History  Problem Relation Age of Onset  . Heart attack Father   . Hypertension Mother   . Diabetes Mother   . Congestive Heart Failure Mother   . Breast cancer Sister   . Lung disease Neg Hx    Social History:  reports that she quit smoking about 12 years ago. Her smoking use included Cigarettes. She started smoking about 48 years ago. She has a 66.00 pack-year smoking history. She has never used smokeless tobacco. She reports that she does not drink alcohol or use drugs.  Allergies:  Allergies  Allergen Reactions  . Demerol Other (See Comments)    Syncope Passed out  . Topamax Other (See Comments)    Had trouble walking  . Ciprofloxacin Other (See Comments)    UNSPECIFIED REACTION   . Avelox [Moxifloxacin Hcl In Nacl] Other (See Comments)    Hypotension.  . Mefoxin [Cefoxitin  Sodium In Dextrose] Rash    Medications Prior to Admission  Medication Sig Dispense Refill  . cyclobenzaprine (FLEXERIL) 5 MG tablet TAKE 1 TABLET (5 MG TOTAL) BY MOUTH 3 (THREE) TIMES DAILY AS NEEDED FOR MUSCLE SPASMS. (Patient taking differently: Take 5 mg by mouth 2 (two) times daily as needed for muscle spasms. ) 60 tablet 0  . diphenhydramine-acetaminophen (TYLENOL PM) 25-500 MG TABS tablet Take 1 tablet by mouth at bedtime as needed (sleep).    Marland Kitchen HYDROcodone-homatropine (HYCODAN) 5-1.5 MG/5ML syrup Take 5 mLs by mouth every 6 (six) hours as needed for cough.    Marland Kitchen ibuprofen (ADVIL,MOTRIN) 200 MG tablet Take 600 mg by mouth every 6 (six) hours as needed for mild pain.    Marland Kitchen lactulose (CHRONULAC) 10 GM/15ML solution Take 10 g by mouth daily. Reported on 07/22/2015    . lidocaine-prilocaine (EMLA) cream Apply 1 application topically daily as needed (pain).     Marland Kitchen loratadine (CLARITIN) 10 MG tablet Take 10 mg by mouth daily as needed for allergies.    Marland Kitchen omeprazole (PRILOSEC) 40 MG capsule Take 40 mg by mouth daily as needed (heartburn).   3  . oxyCODONE (OXYCONTIN) 10 mg 12 hr tablet Take 1 tablet (10 mg total) by mouth every 12 (twelve) hours. (Patient taking  differently: Take 10 mg by mouth every 12 (twelve) hours as needed (pain). ) 60 tablet 0  . OxyCODONE HCl, Abuse Deter, (OXAYDO) 5 MG TABA Take 5 mg by mouth every 4 (four) hours as needed (pain).     . polyethylene glycol powder (GLYCOLAX/MIRALAX) powder Take 17 g by mouth once. Dissolve 1 capful of powder into any liquid and take once daily. Can increase to 2 times daily if no effect after 3-4 days. (Patient taking differently: Take 17 g by mouth daily. Dissolve 1 capful of powder into any liquid and take once daily. Can increase to 2 times daily if no effect after 3-4 days.) 500 g 0  . senna-docusate (SENOKOT-S) 8.6-50 MG tablet Take 1 tablet by mouth daily. (Patient taking differently: Take 1 tablet by mouth at bedtime as needed for mild  constipation. ) 30 tablet 0  . SUMAtriptan (IMITREX) 100 MG tablet Take 100 mg by mouth as needed for migraine or headache. Reported on 04/01/2015    . budesonide-formoterol (SYMBICORT) 160-4.5 MCG/ACT inhaler Inhale 2 puffs into the lungs 2 (two) times daily as needed (SOB, wheezing).     Marland Kitchen KLOR-CON M20 20 MEQ tablet TAKE 1 TABLET BY MOUTH DAILY 30 tablet 3  . PROAIR RESPICLICK 854 (90 BASE) MCG/ACT AEPB Inhale 2 puffs into the lungs twice daily as needed for shortness of breath or wheezing  0  . prochlorperazine (COMPAZINE) 10 MG tablet Take 10 mg by mouth every 6 (six) hours as needed for nausea or vomiting.       No results found for this or any previous visit (from the past 48 hour(s)). No results found.  Review of Systems  Musculoskeletal: Positive for joint pain (Left arm) and neck pain.  All other systems reviewed and are negative.   Blood pressure (!) 149/93, pulse 89, temperature 98.4 F (36.9 C), temperature source Oral, resp. rate 20, SpO2 100 %. Physical Exam  Constitutional: She is oriented to person, place, and time. She appears well-developed and well-nourished. No distress.  HENT:  Head: Normocephalic and atraumatic.  Right Ear: External ear normal.  Left Ear: External ear normal.  Nose: Nose normal.  Mouth/Throat: Oropharynx is clear and moist.  Breathy dysphonia with higher pitch.  Eyes: Conjunctivae and EOM are normal. Pupils are equal, round, and reactive to light.  Neck: Normal range of motion. Neck supple.  Cardiovascular: Normal rate.   Respiratory: Effort normal.  Neurological: She is alert and oriented to person, place, and time. No cranial nerve deficit.  Skin: Skin is warm and dry.  Psychiatric: She has a normal mood and affect. Her behavior is normal. Judgment and thought content normal.     Assessment/Plan Left vocal fold paralysis, dysphonia To OR for SMDL with Prolaryn injections.  Melida Quitter, MD 11/27/2015, 8:18 AM

## 2015-11-27 NOTE — Op Note (Signed)
NAMEALLISEN, PIDGEON NO.:  [id number]  MEDICAL RECORD NO.:  [id number]  LOCATION:  [facility]                         FACILITY:  [facility]  PHYSICIAN:  Onnie Graham, MD     DATE OF BIRTH:  09-Apr-1950  DATE OF PROCEDURE:  11/27/2015 DATE OF DISCHARGE:                              OPERATIVE REPORT   PREOPERATIVE DIAGNOSES:  Left vocal fold paralysis and dysphonia.  POSTOPERATIVE DIAGNOSIS:  Left vocal fold paralysis and dysphonia.  PROCEDURE:  Suspended microdirect laryngoscopy with bilateral Prolaryn injections.  SURGEON:  Onnie Graham, MD  ANESTHESIA:  General jet Venturi ventilation.  COMPLICATIONS:  None.  INDICATION:  The patient is a 65 year old female with metastatic lung cancer who has developed dysphonia, found to be due to left vocal fold paralysis.  She presents to the operating room for surgical management.  FINDINGS:  The vocal folds are without lesion and appeared healthy.  The left vocal fold appeared a bit atrophic compared to the right.  0.45 mL of Prolaryn was injected in the left vocal fold and 0.2 mL in the right.  DESCRIPTION OF PROCEDURE:  The patient was identified in the holding room, informed consent having been obtained including discussion of risks, benefits, alternatives, the patient was brought to the operative suite, and put on the operative table in supine position.  Anesthesia was induced.  The patient was maintained via mask ventilation.  The eyes taped closed and bed was turned 90 degrees from anesthesia.  The patient then was given intravenous steroids during the case.  The tooth guard was placed and a Storz laryngoscope was then placed in the supraglottic position.  This was suspended to the Mayo stand using a Lewy arm.  A 0- degree telescope was used to make a preoperative photograph.  Under the operating microscope, Prolaryn was then injected into the left followed by the right vocal folds, with the total volume listed  above with about two-thirds of the injection in a posterior position and about one-third in an anterior position on each side.  After this was completed, the vocal folds appeared to be in good position and the airway was suctioned.  Postoperative photograph was made with a 0-degree telescope. The larynx was then sprayed with topical lidocaine, and laryngoscope was taken out of suspension and removed from patient's mouth while suctioning the airway.  The tooth guard was removed, and the patient returned to Anesthesia under mask ventilation.  She was awoke and moved to the recovery room in stable condition.     Onnie Graham, MD     DDB/MEDQ  D:  11/27/2015  T:  11/27/2015  Job:  [id number]

## 2015-11-27 NOTE — Transfer of Care (Signed)
Immediate Anesthesia Transfer of Care Note  Patient: [name]  Procedure(s) Performed: Procedure(s) with comments: MICROLARYNGOSCOPY WITH VOCAL CORD INJECTION (N/A) - Micro Direct Laryngoscopy with vocal cord Prolaryn Injection, Jet Ventilation   Patient Location: PACU  Anesthesia Type:General  Level of Consciousness: awake, alert  and oriented  Airway & Oxygen Therapy: Patient Spontanous Breathing and Patient connected to nasal cannula oxygen  Post-op Assessment: Report given to RN and Post -op Vital signs reviewed and stable  Post vital signs: Reviewed and stable  Last Vitals:  Vitals:   11/27/15 0746  BP: (!) 149/93  Pulse: 89  Resp: 20  Temp: 36.9 C    Last Pain:  Vitals:   11/27/15 0746  TempSrc: Oral  PainSc:       Patients Stated Pain Goal: 8 (09/31/12 1624)  Complications: No apparent anesthesia complications

## 2015-11-27 NOTE — Brief Op Note (Signed)
11/27/2015  9:09 AM  PATIENT:  [name]  65 y.o. female  PRE-OPERATIVE DIAGNOSIS:  Dysphonia, left vocal fold paralysis  POST-OPERATIVE DIAGNOSIS:  Dysphonia, left vocal fold paralysis  PROCEDURE:  Procedure(s) with comments: MICROLARYNGOSCOPY WITH VOCAL CORD INJECTION (N/A) - Micro Direct Laryngoscopy with vocal cord Prolaryn Injection, Jet Ventilation   SURGEON:  Surgeon(s) and Role:    * Melida Quitter, MD - Primary  PHYSICIAN ASSISTANT:   ASSISTANTS: none   ANESTHESIA:   general  EBL:  No intake/output data recorded.  BLOOD ADMINISTERED:none  DRAINS: none   LOCAL MEDICATIONS USED:  NONE  SPECIMEN:  No Specimen  DISPOSITION OF SPECIMEN:  N/A  COUNTS:  YES  TOURNIQUET:  * No tourniquets in log *  DICTATION: .Other Dictation: Dictation Number [id number]  PLAN OF CARE: Discharge to home after PACU  PATIENT DISPOSITION:  PACU - hemodynamically stable.   Delay start of Pharmacological VTE agent (>24hrs) due to surgical blood loss or risk of bleeding: no

## 2015-11-27 NOTE — Anesthesia Preprocedure Evaluation (Signed)
Anesthesia Evaluation  Patient identified by MRN, date of birth, ID band Patient awake    Reviewed: Allergy & Precautions, NPO status , Patient's Chart, lab work & pertinent test results  History of Anesthesia Complications Negative for: history of anesthetic complications  Airway Mallampati: II  TM Distance: >3 FB Neck ROM: Full    Dental  (+) Teeth Intact   Pulmonary shortness of breath, COPD, former smoker,    breath sounds clear to auscultation       Cardiovascular hypertension,  Rhythm:Regular     Neuro/Psych  Headaches, PSYCHIATRIC DISORDERS Anxiety Depression  Neuromuscular disease CVA    GI/Hepatic Neg liver ROS, hiatal hernia, GERD  Medicated and Controlled,  Endo/Other  negative endocrine ROS  Renal/GU negative Renal ROS     Musculoskeletal   Abdominal   Peds  Hematology negative hematology ROS (+)   Anesthesia Other Findings   Reproductive/Obstetrics                             Anesthesia Physical Anesthesia Plan  ASA: III  Anesthesia Plan: General   Post-op Pain Management:    Induction: Intravenous  Airway Management Planned:   Additional Equipment: None  Intra-op Plan:   Post-operative Plan: Extubation in OR  Informed Consent: I have reviewed the patients History and Physical, chart, labs and discussed the procedure including the risks, benefits and alternatives for the proposed anesthesia with the patient or authorized representative who has indicated his/her understanding and acceptance.   Dental advisory given  Plan Discussed with: CRNA and Surgeon  Anesthesia Plan Comments: (Jet ventilation)        Anesthesia Quick Evaluation

## 2015-11-28 ENCOUNTER — Encounter (HOSPITAL_COMMUNITY): Payer: Self-pay | Admitting: Otolaryngology

## 2015-11-29 NOTE — Anesthesia Postprocedure Evaluation (Signed)
Anesthesia Post Note  Patient: [name]  Procedure(s) Performed: Procedure(s) (LRB): MICROLARYNGOSCOPY WITH VOCAL CORD INJECTION (N/A)  Patient location during evaluation: PACU Anesthesia Type: General Level of consciousness: awake Pain management: pain level controlled Vital Signs Assessment: post-procedure vital signs reviewed and stable Respiratory status: spontaneous breathing Cardiovascular status: stable Postop Assessment: no signs of nausea or vomiting Anesthetic complications: no    Last Vitals:  Vitals:   11/27/15 0928 11/27/15 0944  BP: 124/90 (!) 151/97  Pulse:  (!) 107  Resp:  18  Temp: 36.4 C     Last Pain:  Vitals:   11/27/15 0746  TempSrc: Oral  PainSc:                  Rainer Mounce

## 2015-12-08 ENCOUNTER — Encounter: Payer: Self-pay | Admitting: *Deleted

## 2015-12-10 ENCOUNTER — Ambulatory Visit (HOSPITAL_BASED_OUTPATIENT_CLINIC_OR_DEPARTMENT_OTHER): Payer: PPO

## 2015-12-10 ENCOUNTER — Other Ambulatory Visit (HOSPITAL_BASED_OUTPATIENT_CLINIC_OR_DEPARTMENT_OTHER): Payer: PPO

## 2015-12-10 ENCOUNTER — Ambulatory Visit (HOSPITAL_BASED_OUTPATIENT_CLINIC_OR_DEPARTMENT_OTHER): Payer: PPO | Admitting: Hematology & Oncology

## 2015-12-10 VITALS — BP 140/82 | HR 86 | Resp 16

## 2015-12-10 DIAGNOSIS — C7951 Secondary malignant neoplasm of bone: Secondary | ICD-10-CM

## 2015-12-10 DIAGNOSIS — Z5111 Encounter for antineoplastic chemotherapy: Secondary | ICD-10-CM | POA: Diagnosis not present

## 2015-12-10 DIAGNOSIS — J910 Malignant pleural effusion: Secondary | ICD-10-CM

## 2015-12-10 DIAGNOSIS — C3490 Malignant neoplasm of unspecified part of unspecified bronchus or lung: Secondary | ICD-10-CM

## 2015-12-10 DIAGNOSIS — C7800 Secondary malignant neoplasm of unspecified lung: Secondary | ICD-10-CM

## 2015-12-10 DIAGNOSIS — C3491 Malignant neoplasm of unspecified part of right bronchus or lung: Secondary | ICD-10-CM

## 2015-12-10 DIAGNOSIS — C3492 Malignant neoplasm of unspecified part of left bronchus or lung: Secondary | ICD-10-CM

## 2015-12-10 DIAGNOSIS — Z5112 Encounter for antineoplastic immunotherapy: Secondary | ICD-10-CM | POA: Diagnosis not present

## 2015-12-10 LAB — LACTATE DEHYDROGENASE: LDH: 201 U/L (ref 125–245)

## 2015-12-10 LAB — CBC WITH DIFFERENTIAL (CANCER CENTER ONLY)
BASO#: 0.1 10*3/uL (ref 0.0–0.2)
BASO%: 2.3 % — AB (ref 0.0–2.0)
EOS ABS: 0.2 10*3/uL (ref 0.0–0.5)
EOS%: 3 % (ref 0.0–7.0)
HCT: 35.9 % (ref 34.8–46.6)
HGB: 11.9 g/dL (ref 11.6–15.9)
LYMPH#: 1.7 10*3/uL (ref 0.9–3.3)
LYMPH%: 29.4 % (ref 14.0–48.0)
MCH: 28.1 pg (ref 26.0–34.0)
MCHC: 33.1 g/dL (ref 32.0–36.0)
MCV: 85 fL (ref 81–101)
MONO#: 0.7 10*3/uL (ref 0.1–0.9)
MONO%: 11.8 % (ref 0.0–13.0)
NEUT%: 53.5 % (ref 39.6–80.0)
NEUTROS ABS: 3.1 10*3/uL (ref 1.5–6.5)
PLATELETS: 248 10*3/uL (ref 145–400)
RBC: 4.24 10*6/uL (ref 3.70–5.32)
RDW: 17.5 % — ABNORMAL HIGH (ref 11.1–15.7)
WBC: 5.8 10*3/uL (ref 3.9–10.0)

## 2015-12-10 LAB — CMP (CANCER CENTER ONLY)
ALT(SGPT): 18 U/L (ref 10–47)
AST: 20 U/L (ref 11–38)
Albumin: 3.4 g/dL (ref 3.3–5.5)
Alkaline Phosphatase: 86 U/L — ABNORMAL HIGH (ref 26–84)
BILIRUBIN TOTAL: 0.4 mg/dL (ref 0.20–1.60)
BUN: 10 mg/dL (ref 7–22)
CHLORIDE: 109 meq/L — AB (ref 98–108)
CO2: 26 mEq/L (ref 18–33)
CREATININE: 0.6 mg/dL (ref 0.6–1.2)
Calcium: 8.4 mg/dL (ref 8.0–10.3)
Glucose, Bld: 115 mg/dL (ref 73–118)
Potassium: 3.4 mEq/L (ref 3.3–4.7)
SODIUM: 143 meq/L (ref 128–145)
TOTAL PROTEIN: 6.7 g/dL (ref 6.4–8.1)

## 2015-12-10 MED ORDER — PACLITAXEL PROTEIN-BOUND CHEMO INJECTION 100 MG
81.0000 mg/m2 | Freq: Once | INTRAVENOUS | Status: AC
  Administered 2015-12-10: 125 mg via INTRAVENOUS
  Filled 2015-12-10: qty 25

## 2015-12-10 MED ORDER — KETOROLAC TROMETHAMINE 15 MG/ML IJ SOLN
INTRAMUSCULAR | Status: AC
Start: 2015-12-10 — End: 2015-12-10
  Filled 2015-12-10: qty 2

## 2015-12-10 MED ORDER — HEPARIN SOD (PORK) LOCK FLUSH 100 UNIT/ML IV SOLN
500.0000 [IU] | Freq: Once | INTRAVENOUS | Status: AC | PRN
  Administered 2015-12-10: 500 [IU]
  Filled 2015-12-10: qty 5

## 2015-12-10 MED ORDER — PROCHLORPERAZINE MALEATE 10 MG PO TABS
10.0000 mg | ORAL_TABLET | Freq: Once | ORAL | Status: AC
  Administered 2015-12-10: 10 mg via ORAL

## 2015-12-10 MED ORDER — KETOROLAC TROMETHAMINE 15 MG/ML IJ SOLN
30.0000 mg | Freq: Once | INTRAMUSCULAR | Status: AC
  Administered 2015-12-10: 30 mg via INTRAVENOUS
  Filled 2015-12-10: qty 1

## 2015-12-10 MED ORDER — PROCHLORPERAZINE MALEATE 10 MG PO TABS
ORAL_TABLET | ORAL | Status: AC
  Filled 2015-12-10: qty 1

## 2015-12-10 MED ORDER — SODIUM CHLORIDE 0.9 % IV SOLN
Freq: Once | INTRAVENOUS | Status: AC
  Administered 2015-12-10: 11:00:00 via INTRAVENOUS

## 2015-12-10 MED ORDER — SODIUM CHLORIDE 0.9% FLUSH
10.0000 mL | INTRAVENOUS | Status: DC | PRN
  Administered 2015-12-10: 10 mL
  Filled 2015-12-10: qty 10

## 2015-12-10 MED ORDER — BEVACIZUMAB CHEMO INJECTION 400 MG/16ML
10.5000 mg/kg | Freq: Once | INTRAVENOUS | Status: AC
  Administered 2015-12-10: 600 mg via INTRAVENOUS
  Filled 2015-12-10: qty 16

## 2015-12-10 NOTE — Patient Instructions (Signed)
[facility] Discharge Instructions for Patients Receiving Chemotherapy  Today you received the following chemotherapy agents Avastin and Abraxane.  To help prevent nausea and vomiting after your treatment, we encourage you to take your nausea medication.   If you develop nausea and vomiting that is not controlled by your nausea medication, call the clinic.   BELOW ARE SYMPTOMS THAT SHOULD BE REPORTED IMMEDIATELY:  *FEVER GREATER THAN 100.5 F  *CHILLS WITH OR WITHOUT FEVER  NAUSEA AND VOMITING THAT IS NOT CONTROLLED WITH YOUR NAUSEA MEDICATION  *UNUSUAL SHORTNESS OF BREATH  *UNUSUAL BRUISING OR BLEEDING  TENDERNESS IN MOUTH AND THROAT WITH OR WITHOUT PRESENCE OF ULCERS  *URINARY PROBLEMS  *BOWEL PROBLEMS  UNUSUAL RASH Items with * indicate a potential emergency and should be followed up as soon as possible.  Feel free to call the clinic you have any questions or concerns. The clinic phone number is [phone].  Please show the [facility] at check-in to the Emergency Department and triage nurse.

## 2015-12-11 NOTE — Progress Notes (Signed)
In a pool This is a  Hematology and Oncology Follow Up Visit  [name] [id number] December 06, 1950 65 y.o. 12/11/2015   Principle Diagnosis:  Metastatic adenocarcinoma the lung - bone metastases, lymph node metastasis and intrapulmonary metastasis Malignant left pleural effusion  Current Therapy:  AbraxaneAvastin -s/p cycle #7 Navelbine/Avastin q 2wk dosing - s/p c#4 - progression  Tecentriq s/p cycle 4 Xgeva 120 mg subcutaneous every month Palliative radiation therapy to the neck.    Interim History:  [name] is here today with her husband. Treated by Dr. Redmond Baseman. She had Teflon injected into the left vocal cord. Her voice is much stronger now. Her breathing is doing better. Overall, she is feeling better. As always, Dr. Redmond Baseman has done a fantastic job.  She is having a lot of neck pain again. There is some pain radiating to the left shoulder. She does have some metastasis to the neck. Overall, she has more arthritis than anything else. We probably need to get another MRI.  Her appetite is doing pretty well. She's had no nausea or vomiting. She has some chronic abdominal discomfort from a hiatal hernia.  She's had no rashes. She's had no leg swelling.   Overall, her performance status is ECOG 1.  Medications:    Medication List       Accurate as of 12/10/15 11:59 PM. Always use your most recent med list.          budesonide-formoterol 160-4.5 MCG/ACT inhaler Commonly known as:  SYMBICORT Inhale 2 puffs into the lungs 2 (two) times daily as needed (SOB, wheezing).   cyclobenzaprine 5 MG tablet Commonly known as:  FLEXERIL TAKE 1 TABLET (5 MG TOTAL) BY MOUTH 3 (THREE) TIMES DAILY AS NEEDED FOR MUSCLE SPASMS.   diphenhydramine-acetaminophen 25-500 MG Tabs tablet Commonly known as:  TYLENOL PM Take 1 tablet by mouth at bedtime as needed (sleep).   HYDROcodone-homatropine 5-1.5 MG/5ML syrup Commonly known as:  HYCODAN Take 5 mLs by mouth every 6 (six) hours as needed  for cough.   ibuprofen 200 MG tablet Commonly known as:  ADVIL,MOTRIN Take 600 mg by mouth every 6 (six) hours as needed for mild pain.   KLOR-CON M20 20 MEQ tablet Generic drug:  potassium chloride SA TAKE 1 TABLET BY MOUTH DAILY   lactulose 10 GM/15ML solution Commonly known as:  CHRONULAC Take 10 g by mouth daily. Reported on 07/22/2015   lidocaine-prilocaine cream Commonly known as:  EMLA Apply 1 application topically daily as needed (pain).   loratadine 10 MG tablet Commonly known as:  CLARITIN Take 10 mg by mouth daily as needed for allergies.   omeprazole 40 MG capsule Commonly known as:  PRILOSEC Take 40 mg by mouth daily as needed (heartburn).   OxyCODONE HCl (Abuse Deter) 5 MG Taba Commonly known as:  OXAYDO Take 5 mg by mouth every 4 (four) hours as needed (pain).   oxyCODONE 10 mg 12 hr tablet Commonly known as:  OXYCONTIN Take 1 tablet (10 mg total) by mouth every 12 (twelve) hours.   polyethylene glycol powder powder Commonly known as:  GLYCOLAX/MIRALAX Take 17 g by mouth once. Dissolve 1 capful of powder into any liquid and take once daily. Can increase to 2 times daily if no effect after 3-4 days.   PROAIR RESPICLICK 341 (90 Base) MCG/ACT Aepb Generic drug:  Albuterol Sulfate Inhale 2 puffs into the lungs twice daily as needed for shortness of breath or wheezing   prochlorperazine 10 MG tablet Commonly known  as:  COMPAZINE Take 10 mg by mouth every 6 (six) hours as needed for nausea or vomiting.   senna-docusate 8.6-50 MG tablet Commonly known as:  Senokot-S Take 1 tablet by mouth daily.   SUMAtriptan 100 MG tablet Commonly known as:  IMITREX Take 100 mg by mouth as needed for migraine or headache. Reported on 04/01/2015       Allergies:  Allergies  Allergen Reactions  . Demerol Other (See Comments)    Syncope Passed out  . Topamax Other (See Comments)    Had trouble walking  . Ciprofloxacin Other (See Comments)    UNSPECIFIED REACTION     . Avelox [Moxifloxacin Hcl In Nacl] Other (See Comments)    Hypotension.  . Mefoxin [Cefoxitin Sodium In Dextrose] Rash    Past Medical History, Surgical history, Social history, and Family History were reviewed and updated.  Review of Systems: All other 10 point review of systems is negative.   Physical Exam:  weight is 133 lb 8 oz (60.6 kg). Her oral temperature is 98.5 F (36.9 C). Her blood pressure is 137/97 (abnormal) and her pulse is 92. Her respiration is 20 and oxygen saturation is 100%.   Wt Readings from Last 3 Encounters:  12/10/15 133 lb 8 oz (60.6 kg)  11/25/15 134 lb (60.8 kg)  11/24/15 132 lb 11.2 oz (60.2 kg)    Well-developed and well-nourished white female in no obvious distress. Head and neck exam shows no ocular or oral lesions. There are no palpable cervical or supraclavicular lymph nodes. Lungs are clear on the right side. There is still some slight decrease breath sounds on the left side. Cardiac exam regular rate and rhythm with no murmurs, rubs or bruits. Abdomen is soft. She is good bowel sounds. There is no fluid wave. There is no palpable liver or spleen tip. Back exam shows no tenderness over the spine, ribs or hips. Extremities shows no clubbing, cyanosis or edema. Skin exam shows no rashes, ecchymoses or petechia. Neurological exam shows no focal neurological deficits.   Lab Results  Component Value Date   WBC 5.8 12/10/2015   HGB 11.9 12/10/2015   HCT 35.9 12/10/2015   MCV 85 12/10/2015   PLT 248 12/10/2015   Lab Results  Component Value Date   FERRITIN 47 10/29/2013   IRON 64 10/29/2013   TIBC 233 (L) 10/29/2013   UIBC 169 10/29/2013   IRONPCTSAT 27 10/29/2013   Lab Results  Component Value Date   RETICCTPCT 1.4 08/13/2008   RBC 4.24 12/10/2015   RETICCTABS 61.6 08/13/2008   No results found for: KPAFRELGTCHN, LAMBDASER, KAPLAMBRATIO No results found for: IGGSERUM, IGA, IGMSERUM No results found for: Odetta Pink, SPEI   Chemistry      Component Value Date/Time   NA 143 12/10/2015 0848   NA 140 07/22/2015 1255   K 3.4 12/10/2015 0848   K 4.0 07/22/2015 1255   CL 109 (H) 12/10/2015 0848   CO2 26 12/10/2015 0848   CO2 27 07/22/2015 1255   BUN 10 12/10/2015 0848   BUN 9.8 07/22/2015 1255   CREATININE 0.6 12/10/2015 0848   CREATININE 0.6 07/22/2015 1255      Component Value Date/Time   CALCIUM 8.4 12/10/2015 0848   CALCIUM 9.1 07/22/2015 1255   ALKPHOS 86 (H) 12/10/2015 0848   AST 20 12/10/2015 0848   ALT 18 12/10/2015 0848   BILITOT 0.40 12/10/2015 0848     Impression and Plan: Ms.  Mcsweeney is a pleasant 65 yo white female with metastatic adenocarcinoma of the lung. She has no "Driver" mutations that we can target.  I Am very grateful that her voice is doing better. This really is helping her quality of life.  We will continue with treatment. I will restart the Avastin. I'll think which she had done surgically should be affected by the Avastin.  We will set up another MRI for her neck. She is alert had some radiation to this area. I Mosher if there is much we can do for this. It sounds like she may have arthritis which could be the real problem.  Her last PET scan was back in October. We pod do not need to do another one until December.  We will plan to see her back in another couple weeks.  I spent about 30 minutes with she and her husband today.   Volanda Napoleon, MD 11/3/20177:21 AM

## 2015-12-12 ENCOUNTER — Ambulatory Visit (HOSPITAL_BASED_OUTPATIENT_CLINIC_OR_DEPARTMENT_OTHER)
Admission: RE | Admit: 2015-12-12 | Discharge: 2015-12-12 | Disposition: A | Discharge status: Home / Self Care | Payer: PPO | Source: Ambulatory Visit | Attending: Hematology & Oncology | Admitting: Hematology & Oncology

## 2015-12-12 DIAGNOSIS — M4802 Spinal stenosis, cervical region: Secondary | ICD-10-CM | POA: Diagnosis not present

## 2015-12-12 DIAGNOSIS — C3491 Malignant neoplasm of unspecified part of right bronchus or lung: Secondary | ICD-10-CM | POA: Insufficient documentation

## 2015-12-12 DIAGNOSIS — C7951 Secondary malignant neoplasm of bone: Secondary | ICD-10-CM | POA: Diagnosis not present

## 2015-12-12 DIAGNOSIS — J910 Malignant pleural effusion: Secondary | ICD-10-CM

## 2015-12-12 DIAGNOSIS — M542 Cervicalgia: Secondary | ICD-10-CM | POA: Diagnosis not present

## 2015-12-12 DIAGNOSIS — M2578 Osteophyte, vertebrae: Secondary | ICD-10-CM | POA: Insufficient documentation

## 2015-12-12 MED ORDER — GADOBENATE DIMEGLUMINE 529 MG/ML IV SOLN: 10.0000 mL | Freq: Once | INTRAVENOUS | Status: DC | PRN

## 2015-12-14 DIAGNOSIS — J3801 Paralysis of vocal cords and larynx, unilateral: Secondary | ICD-10-CM | POA: Diagnosis not present

## 2015-12-14 DIAGNOSIS — R1313 Dysphagia, pharyngeal phase: Secondary | ICD-10-CM | POA: Diagnosis not present

## 2015-12-14 DIAGNOSIS — R490 Dysphonia: Secondary | ICD-10-CM | POA: Diagnosis not present

## 2015-12-18 ENCOUNTER — Other Ambulatory Visit: Payer: Self-pay | Admitting: *Deleted

## 2015-12-18 DIAGNOSIS — C7951 Secondary malignant neoplasm of bone: Secondary | ICD-10-CM

## 2015-12-18 DIAGNOSIS — C3491 Malignant neoplasm of unspecified part of right bronchus or lung: Secondary | ICD-10-CM

## 2015-12-18 DIAGNOSIS — M436 Torticollis: Secondary | ICD-10-CM

## 2015-12-18 MED ORDER — CYCLOBENZAPRINE HCL 5 MG PO TABS: 5.0000 mg | ORAL_TABLET | Freq: Three times a day (TID) | ORAL | 0 refills | Status: DC | PRN

## 2015-12-23 ENCOUNTER — Other Ambulatory Visit (HOSPITAL_BASED_OUTPATIENT_CLINIC_OR_DEPARTMENT_OTHER): Payer: PPO

## 2015-12-23 ENCOUNTER — Ambulatory Visit (HOSPITAL_BASED_OUTPATIENT_CLINIC_OR_DEPARTMENT_OTHER): Payer: PPO | Admitting: Hematology & Oncology

## 2015-12-23 ENCOUNTER — Ambulatory Visit (HOSPITAL_BASED_OUTPATIENT_CLINIC_OR_DEPARTMENT_OTHER): Payer: PPO

## 2015-12-23 ENCOUNTER — Other Ambulatory Visit: Payer: Self-pay | Admitting: *Deleted

## 2015-12-23 VITALS — BP 138/90 | HR 101 | Temp 97.9°F | Resp 20

## 2015-12-23 DIAGNOSIS — C342 Malignant neoplasm of middle lobe, bronchus or lung: Secondary | ICD-10-CM

## 2015-12-23 DIAGNOSIS — C3491 Malignant neoplasm of unspecified part of right bronchus or lung: Secondary | ICD-10-CM

## 2015-12-23 DIAGNOSIS — J910 Malignant pleural effusion: Secondary | ICD-10-CM

## 2015-12-23 DIAGNOSIS — C3492 Malignant neoplasm of unspecified part of left bronchus or lung: Secondary | ICD-10-CM

## 2015-12-23 DIAGNOSIS — C3490 Malignant neoplasm of unspecified part of unspecified bronchus or lung: Secondary | ICD-10-CM

## 2015-12-23 DIAGNOSIS — Z5111 Encounter for antineoplastic chemotherapy: Secondary | ICD-10-CM | POA: Diagnosis not present

## 2015-12-23 DIAGNOSIS — C7951 Secondary malignant neoplasm of bone: Secondary | ICD-10-CM | POA: Diagnosis not present

## 2015-12-23 DIAGNOSIS — Z5112 Encounter for antineoplastic immunotherapy: Secondary | ICD-10-CM | POA: Diagnosis not present

## 2015-12-23 DIAGNOSIS — R300 Dysuria: Secondary | ICD-10-CM

## 2015-12-23 DIAGNOSIS — D508 Other iron deficiency anemias: Secondary | ICD-10-CM

## 2015-12-23 LAB — CBC WITH DIFFERENTIAL (CANCER CENTER ONLY)
BASO#: 0.1 10*3/uL (ref 0.0–0.2)
BASO%: 1.3 % (ref 0.0–2.0)
EOS ABS: 0.2 10*3/uL (ref 0.0–0.5)
EOS%: 3 % (ref 0.0–7.0)
HCT: 36.7 % (ref 34.8–46.6)
HGB: 11.9 g/dL (ref 11.6–15.9)
LYMPH#: 1.9 10*3/uL (ref 0.9–3.3)
LYMPH%: 31.6 % (ref 14.0–48.0)
MCH: 27.4 pg (ref 26.0–34.0)
MCHC: 32.4 g/dL (ref 32.0–36.0)
MCV: 85 fL (ref 81–101)
MONO#: 0.7 10*3/uL (ref 0.1–0.9)
MONO%: 11.3 % (ref 0.0–13.0)
NEUT#: 3.2 10*3/uL (ref 1.5–6.5)
NEUT%: 52.8 % (ref 39.6–80.0)
PLATELETS: 236 10*3/uL (ref 145–400)
RBC: 4.34 10*6/uL (ref 3.70–5.32)
RDW: 17.5 % — AB (ref 11.1–15.7)
WBC: 6.1 10*3/uL (ref 3.9–10.0)

## 2015-12-23 LAB — CMP (CANCER CENTER ONLY)
ALT(SGPT): 19 U/L (ref 10–47)
AST: 19 U/L (ref 11–38)
Albumin: 3.6 g/dL (ref 3.3–5.5)
Alkaline Phosphatase: 90 U/L — ABNORMAL HIGH (ref 26–84)
BUN: 13 mg/dL (ref 7–22)
CHLORIDE: 102 meq/L (ref 98–108)
CO2: 25 meq/L (ref 18–33)
CREATININE: 0.8 mg/dL (ref 0.6–1.2)
Calcium: 8.5 mg/dL (ref 8.0–10.3)
GLUCOSE: 99 mg/dL (ref 73–118)
POTASSIUM: 3.9 meq/L (ref 3.3–4.7)
SODIUM: 138 meq/L (ref 128–145)
TOTAL PROTEIN: 7.1 g/dL (ref 6.4–8.1)
Total Bilirubin: 0.5 mg/dl (ref 0.20–1.60)

## 2015-12-23 LAB — UA PROTEIN, DIPSTICK - CHCC SATELLITE: Protein, Urine: NEGATIVE mg/dL

## 2015-12-23 MED ORDER — DENOSUMAB 120 MG/1.7ML ~~LOC~~ SOLN
120.0000 mg | Freq: Once | SUBCUTANEOUS | Status: AC
  Administered 2015-12-23: 120 mg via SUBCUTANEOUS
  Filled 2015-12-23: qty 1.7

## 2015-12-23 MED ORDER — PROCHLORPERAZINE MALEATE 10 MG PO TABS
ORAL_TABLET | ORAL | Status: AC
  Filled 2015-12-23: qty 1

## 2015-12-23 MED ORDER — PACLITAXEL PROTEIN-BOUND CHEMO INJECTION 100 MG
81.0000 mg/m2 | Freq: Once | INTRAVENOUS | Status: AC
  Administered 2015-12-23: 125 mg via INTRAVENOUS
  Filled 2015-12-23: qty 25

## 2015-12-23 MED ORDER — SODIUM CHLORIDE 0.9 % IV SOLN
Freq: Once | INTRAVENOUS | Status: AC
  Administered 2015-12-23: 13:00:00 via INTRAVENOUS

## 2015-12-23 MED ORDER — SODIUM CHLORIDE 0.9% FLUSH
10.0000 mL | INTRAVENOUS | Status: DC | PRN
  Administered 2015-12-23: 10 mL
  Filled 2015-12-23: qty 10

## 2015-12-23 MED ORDER — OXYCODONE HCL 5 MG PO TABA: 5.0000 mg | ORAL_TABLET | ORAL | 0 refills | Status: DC | PRN

## 2015-12-23 MED ORDER — HEPARIN SOD (PORK) LOCK FLUSH 100 UNIT/ML IV SOLN
500.0000 [IU] | Freq: Once | INTRAVENOUS | Status: AC | PRN
  Administered 2015-12-23: 500 [IU]
  Filled 2015-12-23: qty 5

## 2015-12-23 MED ORDER — PROCHLORPERAZINE MALEATE 10 MG PO TABS
10.0000 mg | ORAL_TABLET | Freq: Once | ORAL | Status: AC
  Administered 2015-12-23: 10 mg via ORAL

## 2015-12-23 MED ORDER — BEVACIZUMAB CHEMO INJECTION 400 MG/16ML
10.5000 mg/kg | Freq: Once | INTRAVENOUS | Status: AC
  Administered 2015-12-23: 600 mg via INTRAVENOUS
  Filled 2015-12-23: qty 8

## 2015-12-23 NOTE — Progress Notes (Signed)
In a pool This is a  Hematology and Oncology Follow Up Visit  [name] [id number] 01/17/1951 65 y.o. 12/23/2015   Principle Diagnosis:  Metastatic adenocarcinoma the lung - bone metastases, lymph node metastasis and intrapulmonary metastasis Malignant left pleural effusion  Current Therapy:  AbraxaneAvastin -s/p cycle #8 Navelbine/Avastin q 2wk dosing - s/p c#4 - progression  Tecentriq s/p cycle 4 Xgeva 120 mg subcutaneous every month Palliative radiation therapy to the neck.    Interim History:  [name] is here today with her husband. We did go and get an MRI of her neck. This was done last week. This showed some stable bony changes from her malignancy. However, she has significant arthritic issues. She has bone spurs and some disc bulging. I believe that this is what is causing her problems.  She has appointment to see Dr. Lynann Bologna this Friday. I know that he will be able to help her out. It is possible that she may need some injections to try to help with any swelling.  We restarted the Avastin with her last cycle. She had her vocal cords injected. Her voice is doing well.  She has had some thigh pain. This is bilaterally. This happens when she wakes up and starts walking around. Once she is more active, the achiness seems to go away.  She has had no cough or shortness of breath. She has had no nausea or vomiting.  Overall, her performance status is ECOG 1.  Medications:    Medication List       Accurate as of 12/23/15 12:16 PM. Always use your most recent med list.          budesonide-formoterol 160-4.5 MCG/ACT inhaler Commonly known as:  SYMBICORT Inhale 2 puffs into the lungs 2 (two) times daily as needed (SOB, wheezing).   cyclobenzaprine 5 MG tablet Commonly known as:  FLEXERIL Take 1 tablet (5 mg total) by mouth 3 (three) times daily as needed for muscle spasms.   diphenhydramine-acetaminophen 25-500 MG Tabs tablet Commonly known as:  TYLENOL  PM Take 1 tablet by mouth at bedtime as needed (sleep).   HYDROcodone-homatropine 5-1.5 MG/5ML syrup Commonly known as:  HYCODAN Take 5 mLs by mouth every 6 (six) hours as needed for cough.   ibuprofen 200 MG tablet Commonly known as:  ADVIL,MOTRIN Take 600 mg by mouth every 6 (six) hours as needed for mild pain.   KLOR-CON M20 20 MEQ tablet Generic drug:  potassium chloride SA TAKE 1 TABLET BY MOUTH DAILY   lactulose 10 GM/15ML solution Commonly known as:  CHRONULAC Take 10 g by mouth daily. Reported on 07/22/2015   lidocaine-prilocaine cream Commonly known as:  EMLA Apply 1 application topically daily as needed (pain).   loratadine 10 MG tablet Commonly known as:  CLARITIN Take 10 mg by mouth daily as needed for allergies.   omeprazole 40 MG capsule Commonly known as:  PRILOSEC Take 40 mg by mouth daily as needed (heartburn).   oxyCODONE 10 mg 12 hr tablet Commonly known as:  OXYCONTIN Take 1 tablet (10 mg total) by mouth every 12 (twelve) hours.   OxyCODONE HCl (Abuse Deter) 5 MG Taba Commonly known as:  OXAYDO Take 5 mg by mouth every 4 (four) hours as needed (pain).   polyethylene glycol powder powder Commonly known as:  GLYCOLAX/MIRALAX Take 17 g by mouth once. Dissolve 1 capful of powder into any liquid and take once daily. Can increase to 2 times daily if no effect after 3-4  days.   PROAIR RESPICLICK 381 (90 Base) MCG/ACT Aepb Generic drug:  Albuterol Sulfate Inhale 2 puffs into the lungs twice daily as needed for shortness of breath or wheezing   prochlorperazine 10 MG tablet Commonly known as:  COMPAZINE Take 10 mg by mouth every 6 (six) hours as needed for nausea or vomiting.   senna-docusate 8.6-50 MG tablet Commonly known as:  Senokot-S Take 1 tablet by mouth daily.   SUMAtriptan 100 MG tablet Commonly known as:  IMITREX Take 100 mg by mouth as needed for migraine or headache. Reported on 04/01/2015       Allergies:  Allergies  Allergen  Reactions  . Demerol Other (See Comments)    Syncope Passed out  . Topamax Other (See Comments)    Had trouble walking  . Ciprofloxacin Other (See Comments)    UNSPECIFIED REACTION   . Avelox [Moxifloxacin Hcl In Nacl] Other (See Comments)    Hypotension.  . Mefoxin [Cefoxitin Sodium In Dextrose] Rash    Past Medical History, Surgical history, Social history, and Family History were reviewed and updated.  Review of Systems: All other 10 point review of systems is negative.   Physical Exam:  oral temperature is 97.9 F (36.6 C). Her blood pressure is 138/90 and her pulse is 101 (abnormal). Her respiration is 20.   Wt Readings from Last 3 Encounters:  12/10/15 133 lb 8 oz (60.6 kg)  11/25/15 134 lb (60.8 kg)  11/24/15 132 lb 11.2 oz (60.2 kg)    Well-developed and well-nourished white female in no obvious distress. Head and neck exam shows no ocular or oral lesions. There are no palpable cervical or supraclavicular lymph nodes. Lungs are clear on the right side. There is still some slight decrease breath sounds on the left side. Cardiac exam regular rate and rhythm with no murmurs, rubs or bruits. Abdomen is soft. She is good bowel sounds. There is no fluid wave. There is no palpable liver or spleen tip. Back exam shows no tenderness over the spine, ribs or hips. Extremities shows no clubbing, cyanosis or edema. Skin exam shows no rashes, ecchymoses or petechia. Neurological exam shows no focal neurological deficits.   Lab Results  Component Value Date   WBC 6.1 12/23/2015   HGB 11.9 12/23/2015   HCT 36.7 12/23/2015   MCV 85 12/23/2015   PLT 236 12/23/2015   Lab Results  Component Value Date   FERRITIN 47 10/29/2013   IRON 64 10/29/2013   TIBC 233 (L) 10/29/2013   UIBC 169 10/29/2013   IRONPCTSAT 27 10/29/2013   Lab Results  Component Value Date   RETICCTPCT 1.4 08/13/2008   RBC 4.34 12/23/2015   RETICCTABS 61.6 08/13/2008   No results found for: KPAFRELGTCHN,  LAMBDASER, KAPLAMBRATIO No results found for: IGGSERUM, IGA, IGMSERUM No results found for: Odetta Pink, SPEI   Chemistry      Component Value Date/Time   NA 138 12/23/2015 1107   NA 140 07/22/2015 1255   K 3.9 12/23/2015 1107   K 4.0 07/22/2015 1255   CL 102 12/23/2015 1107   CO2 25 12/23/2015 1107   CO2 27 07/22/2015 1255   BUN 13 12/23/2015 1107   BUN 9.8 07/22/2015 1255   CREATININE 0.8 12/23/2015 1107   CREATININE 0.6 07/22/2015 1255      Component Value Date/Time   CALCIUM 8.5 12/23/2015 1107   CALCIUM 9.1 07/22/2015 1255   ALKPHOS 90 (H) 12/23/2015 1107   AST  19 12/23/2015 1107   ALT 19 12/23/2015 1107   BILITOT 0.50 12/23/2015 1107     Impression and Plan: Ms. Myricks is a pleasant 64 yo white female with metastatic adenocarcinoma of the lung. She has no "Driver" mutations that we can target.  I am very grateful that her voice is doing better. This really is helping her quality of life.  I really believe that the MRI shows that it is arthritis and bone spurs that are causing the pain in the left shoulder and arm. I'll think it is metastasis that is doing this. She will see the orthopedist on Friday.   We will continue her therapy. I personally believe that she is doing okay with this and that she has relatively stable disease. Now that we have her back on the Avastin, I think this will help her further.   We will plan to get her back in 2 weeks. I don't that she will enjoy a wonderful Thanksgiving.   I spent about 30 minutes with she and her husband today.   Volanda Napoleon, MD 11/15/201712:16 PM

## 2015-12-23 NOTE — Addendum Note (Signed)
Addended by: Burney Gauze R on: 12/23/2015 01:14 PM   Modules accepted: Orders

## 2015-12-23 NOTE — Patient Instructions (Addendum)
[facility] Discharge Instructions for Patients Receiving Chemotherapy  Today you received the following chemotherapy agents Abraxane, Avastin and xgeva   To help prevent nausea and vomiting after your treatment, we encourage you to take your nausea medication    If you develop nausea and vomiting that is not controlled by your nausea medication, call the clinic.   BELOW ARE SYMPTOMS THAT SHOULD BE REPORTED IMMEDIATELY:  *FEVER GREATER THAN 100.5 F  *CHILLS WITH OR WITHOUT FEVER  NAUSEA AND VOMITING THAT IS NOT CONTROLLED WITH YOUR NAUSEA MEDICATION  *UNUSUAL SHORTNESS OF BREATH  *UNUSUAL BRUISING OR BLEEDING  TENDERNESS IN MOUTH AND THROAT WITH OR WITHOUT PRESENCE OF ULCERS  *URINARY PROBLEMS  *BOWEL PROBLEMS  UNUSUAL RASH Items with * indicate a potential emergency and should be followed up as soon as possible.  Feel free to call the clinic you have any questions or concerns. The clinic phone number is [phone].  Please show the [facility] at check-in to the Emergency Department and triage nurse.

## 2015-12-25 DIAGNOSIS — M542 Cervicalgia: Secondary | ICD-10-CM | POA: Diagnosis not present

## 2016-01-04 DIAGNOSIS — M47812 Spondylosis without myelopathy or radiculopathy, cervical region: Secondary | ICD-10-CM | POA: Diagnosis not present

## 2016-01-06 ENCOUNTER — Ambulatory Visit: Payer: PPO

## 2016-01-06 ENCOUNTER — Other Ambulatory Visit: Payer: PPO

## 2016-01-07 ENCOUNTER — Ambulatory Visit (HOSPITAL_BASED_OUTPATIENT_CLINIC_OR_DEPARTMENT_OTHER): Payer: PPO | Admitting: Hematology & Oncology

## 2016-01-07 ENCOUNTER — Ambulatory Visit: Payer: PPO

## 2016-01-07 ENCOUNTER — Ambulatory Visit (HOSPITAL_BASED_OUTPATIENT_CLINIC_OR_DEPARTMENT_OTHER): Payer: PPO

## 2016-01-07 ENCOUNTER — Other Ambulatory Visit (HOSPITAL_BASED_OUTPATIENT_CLINIC_OR_DEPARTMENT_OTHER): Payer: PPO

## 2016-01-07 VITALS — BP 144/96 | HR 83 | Temp 98.4°F | Resp 20 | Wt 133.8 lb

## 2016-01-07 DIAGNOSIS — C7951 Secondary malignant neoplasm of bone: Secondary | ICD-10-CM

## 2016-01-07 DIAGNOSIS — J910 Malignant pleural effusion: Secondary | ICD-10-CM | POA: Diagnosis not present

## 2016-01-07 DIAGNOSIS — C3491 Malignant neoplasm of unspecified part of right bronchus or lung: Secondary | ICD-10-CM

## 2016-01-07 DIAGNOSIS — Z5111 Encounter for antineoplastic chemotherapy: Secondary | ICD-10-CM

## 2016-01-07 DIAGNOSIS — C3490 Malignant neoplasm of unspecified part of unspecified bronchus or lung: Secondary | ICD-10-CM

## 2016-01-07 DIAGNOSIS — C3492 Malignant neoplasm of unspecified part of left bronchus or lung: Secondary | ICD-10-CM

## 2016-01-07 DIAGNOSIS — M4722 Other spondylosis with radiculopathy, cervical region: Secondary | ICD-10-CM

## 2016-01-07 DIAGNOSIS — R300 Dysuria: Secondary | ICD-10-CM

## 2016-01-07 DIAGNOSIS — Z5112 Encounter for antineoplastic immunotherapy: Secondary | ICD-10-CM

## 2016-01-07 DIAGNOSIS — D508 Other iron deficiency anemias: Secondary | ICD-10-CM

## 2016-01-07 LAB — CMP (CANCER CENTER ONLY)
ALBUMIN: 3.5 g/dL (ref 3.3–5.5)
ALK PHOS: 79 U/L (ref 26–84)
ALT(SGPT): 12 U/L (ref 10–47)
AST: 25 U/L (ref 11–38)
BUN, Bld: 8 mg/dL (ref 7–22)
CHLORIDE: 105 meq/L (ref 98–108)
CO2: 25 mEq/L (ref 18–33)
Calcium: 8.8 mg/dL (ref 8.0–10.3)
Creat: 0.4 mg/dl — ABNORMAL LOW (ref 0.6–1.2)
Glucose, Bld: 89 mg/dL (ref 73–118)
POTASSIUM: 3.8 meq/L (ref 3.3–4.7)
Sodium: 139 mEq/L (ref 128–145)
TOTAL PROTEIN: 6.7 g/dL (ref 6.4–8.1)
Total Bilirubin: 0.4 mg/dl (ref 0.20–1.60)

## 2016-01-07 LAB — CBC WITH DIFFERENTIAL (CANCER CENTER ONLY)
BASO#: 0.1 10*3/uL (ref 0.0–0.2)
BASO%: 1.2 % (ref 0.0–2.0)
EOS ABS: 0.1 10*3/uL (ref 0.0–0.5)
EOS%: 1.5 % (ref 0.0–7.0)
HEMATOCRIT: 35.5 % (ref 34.8–46.6)
HEMOGLOBIN: 11.5 g/dL — AB (ref 11.6–15.9)
LYMPH#: 1.8 10*3/uL (ref 0.9–3.3)
LYMPH%: 30 % (ref 14.0–48.0)
MCH: 27.3 pg (ref 26.0–34.0)
MCHC: 32.4 g/dL (ref 32.0–36.0)
MCV: 84 fL (ref 81–101)
MONO#: 0.7 10*3/uL (ref 0.1–0.9)
MONO%: 11.6 % (ref 0.0–13.0)
NEUT%: 55.7 % (ref 39.6–80.0)
NEUTROS ABS: 3.4 10*3/uL (ref 1.5–6.5)
Platelets: 227 10*3/uL (ref 145–400)
RBC: 4.21 10*6/uL (ref 3.70–5.32)
RDW: 17.8 % — ABNORMAL HIGH (ref 11.1–15.7)
WBC: 6 10*3/uL (ref 3.9–10.0)

## 2016-01-07 LAB — LACTATE DEHYDROGENASE: LDH: 201 U/L (ref 125–245)

## 2016-01-07 MED ORDER — LACTULOSE 10 GM/15ML PO SOLN: 10.0000 g | Freq: Every day | ORAL | 6 refills | Status: DC

## 2016-01-07 MED ORDER — HEPARIN SOD (PORK) LOCK FLUSH 100 UNIT/ML IV SOLN
500.0000 [IU] | Freq: Once | INTRAVENOUS | Status: AC | PRN
  Administered 2016-01-07: 500 [IU]
  Filled 2016-01-07: qty 5

## 2016-01-07 MED ORDER — PROCHLORPERAZINE MALEATE 10 MG PO TABS
10.0000 mg | ORAL_TABLET | Freq: Once | ORAL | Status: AC
  Administered 2016-01-07: 10 mg via ORAL

## 2016-01-07 MED ORDER — SODIUM CHLORIDE 0.9 % IV SOLN
Freq: Once | INTRAVENOUS | Status: AC
  Administered 2016-01-07: 15:00:00 via INTRAVENOUS

## 2016-01-07 MED ORDER — KETOROLAC TROMETHAMINE 30 MG/ML IJ SOLN
30.0000 mg | Freq: Once | INTRAMUSCULAR | Status: DC
  Administered 2016-01-07: 30 mg via INTRAVENOUS
  Filled 2016-01-07: qty 1

## 2016-01-07 MED ORDER — KETOROLAC TROMETHAMINE 15 MG/ML IJ SOLN
INTRAMUSCULAR | Status: AC
  Filled 2016-01-07: qty 2

## 2016-01-07 MED ORDER — SODIUM CHLORIDE 0.9 % IV SOLN
10.5000 mg/kg | Freq: Once | INTRAVENOUS | Status: AC
  Administered 2016-01-07: 600 mg via INTRAVENOUS
  Filled 2016-01-07: qty 16

## 2016-01-07 MED ORDER — SODIUM CHLORIDE 0.9% FLUSH
10.0000 mL | INTRAVENOUS | Status: DC | PRN
  Administered 2016-01-07: 10 mL
  Filled 2016-01-07: qty 10

## 2016-01-07 MED ORDER — PROCHLORPERAZINE MALEATE 10 MG PO TABS
ORAL_TABLET | ORAL | Status: AC
  Filled 2016-01-07: qty 1

## 2016-01-07 MED ORDER — PACLITAXEL PROTEIN-BOUND CHEMO INJECTION 100 MG
81.0000 mg/m2 | Freq: Once | INTRAVENOUS | Status: AC
  Administered 2016-01-07: 125 mg via INTRAVENOUS
  Filled 2016-01-07: qty 25

## 2016-01-07 NOTE — Addendum Note (Signed)
Addended by: Burney Gauze R on: 01/07/2016 03:03 PM   Modules accepted: Orders

## 2016-01-07 NOTE — Progress Notes (Signed)
In a pool This is a  Hematology and Oncology Follow Up Visit  [name] [id number] November 18, 1950 65 y.o. 01/07/2016   Principle Diagnosis:  Metastatic adenocarcinoma the lung - bone metastases, lymph node metastasis and intrapulmonary metastasis Malignant left pleural effusion  Current Therapy:  AbraxaneAvastin -s/p cycle #9 Navelbine/Avastin q 2wk dosing - s/p c#4 - progression  Tecentriq s/p cycle 4 Xgeva 120 mg subcutaneous every month Palliative radiation therapy to the neck.    Interim History:  [name] is here today with her husband. She did go see Dr. Lynann Bologna of orthopedic surgery. He did not think that there is anything that he can do surgically for her neck. He referred her to there pain specialist in their office for an epidural injection series. Adding this is a good idea.  She had a good Thanksgiving. She and her family were all together. She ate well.  Her voice might be a little bit weaker. I really cannot tell a difference. She thinks that her voice is not as strong.  She's had no cough. She's had no hemoptysis. She's had no problems with bowels or bladder. She is a little bit constipated. She is on lactulose.  She's had fairly good pain control. She is on oxycodone for pain control.  Overall, [place] for status is ECOG 1.   Medications:    Medication List       Accurate as of 01/07/16  2:35 PM. Always use your most recent med list.          budesonide-formoterol 160-4.5 MCG/ACT inhaler Commonly known as:  SYMBICORT Inhale 2 puffs into the lungs 2 (two) times daily as needed (SOB, wheezing).   cyclobenzaprine 5 MG tablet Commonly known as:  FLEXERIL Take 1 tablet (5 mg total) by mouth 3 (three) times daily as needed for muscle spasms.   diphenhydramine-acetaminophen 25-500 MG Tabs tablet Commonly known as:  TYLENOL PM Take 1 tablet by mouth at bedtime as needed (sleep).   HYDROcodone-homatropine 5-1.5 MG/5ML syrup Commonly known as:   HYCODAN Take 5 mLs by mouth every 6 (six) hours as needed for cough.   ibuprofen 200 MG tablet Commonly known as:  ADVIL,MOTRIN Take 600 mg by mouth every 6 (six) hours as needed for mild pain.   KLOR-CON M20 20 MEQ tablet Generic drug:  potassium chloride SA TAKE 1 TABLET BY MOUTH DAILY   lactulose 10 GM/15ML solution Commonly known as:  CHRONULAC Take 10 g by mouth daily. Reported on 07/22/2015   lidocaine-prilocaine cream Commonly known as:  EMLA Apply 1 application topically daily as needed (pain).   loratadine 10 MG tablet Commonly known as:  CLARITIN Take 10 mg by mouth daily as needed for allergies.   omeprazole 40 MG capsule Commonly known as:  PRILOSEC Take 40 mg by mouth daily as needed (heartburn).   oxyCODONE 10 mg 12 hr tablet Commonly known as:  OXYCONTIN Take 1 tablet (10 mg total) by mouth every 12 (twelve) hours.   OxyCODONE HCl (Abuse Deter) 5 MG Taba Commonly known as:  OXAYDO Take 5 mg by mouth every 4 (four) hours as needed (pain).   polyethylene glycol powder powder Commonly known as:  GLYCOLAX/MIRALAX Take 17 g by mouth once. Dissolve 1 capful of powder into any liquid and take once daily. Can increase to 2 times daily if no effect after 3-4 days.   PROAIR RESPICLICK 696 (90 Base) MCG/ACT Aepb Generic drug:  Albuterol Sulfate Inhale 2 puffs into the lungs twice daily as  needed for shortness of breath or wheezing   prochlorperazine 10 MG tablet Commonly known as:  COMPAZINE Take 10 mg by mouth every 6 (six) hours as needed for nausea or vomiting.   senna-docusate 8.6-50 MG tablet Commonly known as:  Senokot-S Take 1 tablet by mouth daily.   SUMAtriptan 100 MG tablet Commonly known as:  IMITREX Take 100 mg by mouth as needed for migraine or headache. Reported on 04/01/2015       Allergies:  Allergies  Allergen Reactions  . Demerol Other (See Comments)    Syncope Passed out  . Topamax Other (See Comments)    Had trouble walking  .  Ciprofloxacin Other (See Comments)    UNSPECIFIED REACTION   . Avelox [Moxifloxacin Hcl In Nacl] Other (See Comments)    Hypotension.  . Mefoxin [Cefoxitin Sodium In Dextrose] Rash    Past Medical History, Surgical history, Social history, and Family History were reviewed and updated.  Review of Systems: All other 10 point review of systems is negative.   Physical Exam:  weight is 133 lb 12.8 oz (60.7 kg). Her oral temperature is 98.4 F (36.9 C). Her blood pressure is 144/96 (abnormal) and her pulse is 83. Her respiration is 20.   Wt Readings from Last 3 Encounters:  01/07/16 133 lb 12.8 oz (60.7 kg)  12/10/15 133 lb 8 oz (60.6 kg)  11/25/15 134 lb (60.8 kg)    Well-developed and well-nourished white female in no obvious distress. Head and neck exam shows no ocular or oral lesions. There are no palpable cervical or supraclavicular lymph nodes. Lungs are clear on the right side. There is still some slight decrease breath sounds on the left side. Cardiac exam regular rate and rhythm with no murmurs, rubs or bruits. Abdomen is soft. She is good bowel sounds. There is no fluid wave. There is no palpable liver or spleen tip. Back exam shows no tenderness over the spine, ribs or hips. Extremities shows no clubbing, cyanosis or edema. Skin exam shows no rashes, ecchymoses or petechia. Neurological exam shows no focal neurological deficits.   Lab Results  Component Value Date   WBC 6.0 01/07/2016   HGB 11.5 (L) 01/07/2016   HCT 35.5 01/07/2016   MCV 84 01/07/2016   PLT 227 01/07/2016   Lab Results  Component Value Date   FERRITIN 47 10/29/2013   IRON 64 10/29/2013   TIBC 233 (L) 10/29/2013   UIBC 169 10/29/2013   IRONPCTSAT 27 10/29/2013   Lab Results  Component Value Date   RETICCTPCT 1.4 08/13/2008   RBC 4.21 01/07/2016   RETICCTABS 61.6 08/13/2008   No results found for: KPAFRELGTCHN, LAMBDASER, KAPLAMBRATIO No results found for: IGGSERUM, IGA, IGMSERUM No results found  for: Odetta Pink, SPEI   Chemistry      Component Value Date/Time   NA 139 01/07/2016 1234   NA 140 07/22/2015 1255   K 3.8 01/07/2016 1234   K 4.0 07/22/2015 1255   CL 105 01/07/2016 1234   CO2 25 01/07/2016 1234   CO2 27 07/22/2015 1255   BUN 8 01/07/2016 1234   BUN 9.8 07/22/2015 1255   CREATININE 0.4 (L) 01/07/2016 1234   CREATININE 0.6 07/22/2015 1255      Component Value Date/Time   CALCIUM 8.8 01/07/2016 1234   CALCIUM 9.1 07/22/2015 1255   ALKPHOS 79 01/07/2016 1234   AST 25 01/07/2016 1234   ALT 12 01/07/2016 1234   BILITOT 0.40 01/07/2016  1234     Impression and Plan: Ms. Georgiades is a pleasant 65 yo white female with metastatic adenocarcinoma of the lung. She has no "Driver" mutations that we can target.  Hopefully, the epidural injections into her neck will help. I know she has a lot of arthritis. I don't think that malignancy is the source of the pain in her neck or left shoulder.   We will go ahead and treat her today.  I will refill her lactulose. This helps with her bowel movements.  We will plan to get her back in another couple weeks.     Volanda Napoleon, MD 11/30/20172:35 PM

## 2016-01-08 LAB — IRON AND TIBC
%SAT: 24 % (ref 21–57)
Iron: 58 ug/dL (ref 41–142)
TIBC: 236 ug/dL (ref 236–444)
UIBC: 179 ug/dL (ref 120–384)

## 2016-01-08 LAB — FERRITIN: FERRITIN: 138 ng/mL (ref 9–269)

## 2016-01-11 DIAGNOSIS — Z8669 Personal history of other diseases of the nervous system and sense organs: Secondary | ICD-10-CM | POA: Diagnosis not present

## 2016-01-11 DIAGNOSIS — J329 Chronic sinusitis, unspecified: Secondary | ICD-10-CM | POA: Diagnosis not present

## 2016-01-11 DIAGNOSIS — J40 Bronchitis, not specified as acute or chronic: Secondary | ICD-10-CM | POA: Diagnosis not present

## 2016-01-19 DIAGNOSIS — M47812 Spondylosis without myelopathy or radiculopathy, cervical region: Secondary | ICD-10-CM | POA: Diagnosis not present

## 2016-01-20 ENCOUNTER — Other Ambulatory Visit (HOSPITAL_BASED_OUTPATIENT_CLINIC_OR_DEPARTMENT_OTHER): Payer: PPO

## 2016-01-20 ENCOUNTER — Ambulatory Visit (HOSPITAL_BASED_OUTPATIENT_CLINIC_OR_DEPARTMENT_OTHER): Payer: PPO

## 2016-01-20 ENCOUNTER — Ambulatory Visit: Payer: PPO

## 2016-01-20 VITALS — BP 123/91 | HR 94 | Temp 98.2°F | Resp 18

## 2016-01-20 DIAGNOSIS — C7951 Secondary malignant neoplasm of bone: Secondary | ICD-10-CM

## 2016-01-20 DIAGNOSIS — C3492 Malignant neoplasm of unspecified part of left bronchus or lung: Secondary | ICD-10-CM

## 2016-01-20 DIAGNOSIS — Z5112 Encounter for antineoplastic immunotherapy: Secondary | ICD-10-CM

## 2016-01-20 DIAGNOSIS — C3491 Malignant neoplasm of unspecified part of right bronchus or lung: Secondary | ICD-10-CM

## 2016-01-20 DIAGNOSIS — C3490 Malignant neoplasm of unspecified part of unspecified bronchus or lung: Secondary | ICD-10-CM | POA: Diagnosis not present

## 2016-01-20 DIAGNOSIS — C342 Malignant neoplasm of middle lobe, bronchus or lung: Secondary | ICD-10-CM

## 2016-01-20 DIAGNOSIS — J910 Malignant pleural effusion: Secondary | ICD-10-CM

## 2016-01-20 DIAGNOSIS — Z5111 Encounter for antineoplastic chemotherapy: Secondary | ICD-10-CM

## 2016-01-20 DIAGNOSIS — M4722 Other spondylosis with radiculopathy, cervical region: Secondary | ICD-10-CM

## 2016-01-20 LAB — CBC WITH DIFFERENTIAL (CANCER CENTER ONLY)
BASO#: 0 10*3/uL (ref 0.0–0.2)
BASO%: 0.2 % (ref 0.0–2.0)
EOS%: 0 % (ref 0.0–7.0)
Eosinophils Absolute: 0 10*3/uL (ref 0.0–0.5)
HCT: 36.7 % (ref 34.8–46.6)
HEMOGLOBIN: 12.2 g/dL (ref 11.6–15.9)
LYMPH#: 1.2 10*3/uL (ref 0.9–3.3)
LYMPH%: 20.3 % (ref 14.0–48.0)
MCH: 27.4 pg (ref 26.0–34.0)
MCHC: 33.2 g/dL (ref 32.0–36.0)
MCV: 83 fL (ref 81–101)
MONO#: 0.4 10*3/uL (ref 0.1–0.9)
MONO%: 7.4 % (ref 0.0–13.0)
NEUT%: 72.1 % (ref 39.6–80.0)
NEUTROS ABS: 4.1 10*3/uL (ref 1.5–6.5)
PLATELETS: 280 10*3/uL (ref 145–400)
RBC: 4.45 10*6/uL (ref 3.70–5.32)
RDW: 18 % — ABNORMAL HIGH (ref 11.1–15.7)
WBC: 5.7 10*3/uL (ref 3.9–10.0)

## 2016-01-20 LAB — CMP (CANCER CENTER ONLY)
ALBUMIN: 4 g/dL (ref 3.3–5.5)
ALK PHOS: 79 U/L (ref 26–84)
ALT: 10 U/L (ref 10–47)
AST: 22 U/L (ref 11–38)
BILIRUBIN TOTAL: 0.6 mg/dL (ref 0.20–1.60)
BUN: 15 mg/dL (ref 7–22)
CHLORIDE: 103 meq/L (ref 98–108)
CO2: 21 mEq/L (ref 18–33)
CREATININE: 0.7 mg/dL (ref 0.6–1.2)
Calcium: 9 mg/dL (ref 8.0–10.3)
Glucose, Bld: 148 mg/dL — ABNORMAL HIGH (ref 73–118)
Potassium: 3.8 mEq/L (ref 3.3–4.7)
SODIUM: 140 meq/L (ref 128–145)
TOTAL PROTEIN: 7.6 g/dL (ref 6.4–8.1)

## 2016-01-20 MED ORDER — BEVACIZUMAB CHEMO INJECTION 400 MG/16ML
10.5000 mg/kg | Freq: Once | INTRAVENOUS | Status: AC
  Administered 2016-01-20: 600 mg via INTRAVENOUS
  Filled 2016-01-20: qty 16

## 2016-01-20 MED ORDER — PROCHLORPERAZINE MALEATE 10 MG PO TABS
ORAL_TABLET | ORAL | Status: AC
  Filled 2016-01-20: qty 1

## 2016-01-20 MED ORDER — PACLITAXEL PROTEIN-BOUND CHEMO INJECTION 100 MG
81.0000 mg/m2 | Freq: Once | INTRAVENOUS | Status: AC
  Administered 2016-01-20: 125 mg via INTRAVENOUS
  Filled 2016-01-20: qty 5

## 2016-01-20 MED ORDER — DENOSUMAB 120 MG/1.7ML ~~LOC~~ SOLN
120.0000 mg | Freq: Once | SUBCUTANEOUS | Status: AC
  Administered 2016-01-20: 120 mg via SUBCUTANEOUS
  Filled 2016-01-20: qty 1.7

## 2016-01-20 MED ORDER — SODIUM CHLORIDE 0.9% FLUSH
10.0000 mL | INTRAVENOUS | Status: DC | PRN
  Administered 2016-01-20: 10 mL
  Filled 2016-01-20: qty 10

## 2016-01-20 MED ORDER — PROCHLORPERAZINE MALEATE 10 MG PO TABS
10.0000 mg | ORAL_TABLET | Freq: Once | ORAL | Status: AC
  Administered 2016-01-20: 10 mg via ORAL

## 2016-01-20 MED ORDER — SODIUM CHLORIDE 0.9 % IV SOLN
Freq: Once | INTRAVENOUS | Status: AC
  Administered 2016-01-20: 11:00:00 via INTRAVENOUS

## 2016-01-20 MED ORDER — HEPARIN SOD (PORK) LOCK FLUSH 100 UNIT/ML IV SOLN
500.0000 [IU] | Freq: Once | INTRAVENOUS | Status: AC | PRN
  Administered 2016-01-20: 500 [IU]
  Filled 2016-01-20: qty 5

## 2016-01-20 NOTE — Patient Instructions (Signed)
[facility] Discharge Instructions for Patients Receiving Chemotherapy  Today you received the following chemotherapy agents Abraxane, Avastin and xgeva   To help prevent nausea and vomiting after your treatment, we encourage you to take your nausea medication    If you develop nausea and vomiting that is not controlled by your nausea medication, call the clinic.   BELOW ARE SYMPTOMS THAT SHOULD BE REPORTED IMMEDIATELY:  *FEVER GREATER THAN 100.5 F  *CHILLS WITH OR WITHOUT FEVER  NAUSEA AND VOMITING THAT IS NOT CONTROLLED WITH YOUR NAUSEA MEDICATION  *UNUSUAL SHORTNESS OF BREATH  *UNUSUAL BRUISING OR BLEEDING  TENDERNESS IN MOUTH AND THROAT WITH OR WITHOUT PRESENCE OF ULCERS  *URINARY PROBLEMS  *BOWEL PROBLEMS  UNUSUAL RASH Items with * indicate a potential emergency and should be followed up as soon as possible.  Feel free to call the clinic you have any questions or concerns. The clinic phone number is [phone].  Please show the [place] [facility] at check-in to the Emergency Department and triage nurse.

## 2016-01-20 NOTE — Patient Instructions (Signed)
Central Lines A central line is a thin tube (catheter) that is put in your vein to give you medicine or food. It may also be used to take blood for lab tests. There are different types of central lines. The type of central line you receive depends on how long it will be needed, your medical condition, and the condition of your veins.  HOME CARE   Follow your doctor's instructions for the type of central line that you have.  Change bandages as told by your doctor. Look for redness or irritation during a bandage change. If a bandage gets wet, have it changed right away.  Keep the area where the central line goes into your skin clean at all times.   Use saline solution to clear out (flush) your central line as told by your doctor.  Always wash your hands before changing bandages or rinsing out the central line.  If the central line accidentally gets pulled on, make sure that:  The bandage is okay.  There is no bleeding.  The line has not been pulled out.   Limit lifting, using your arm, or doing other activity as told by your doctor.   Swim or bathe only if your doctor approves. Your doctor can tell you how to keep your specific type of bandage from getting wet.   GET HELP RIGHT AWAY IF:   You have chills.  You have a fever.   You have shortness of breath or trouble breathing.   You have chest pain.   You feel your heart beating fast or "skipping" beats.   You feel dizzy or faint.  You have bleeding that does not stop from the site of the central line.   You have pain, redness, puffiness (swelling), or drainage at the site of the central line.   You have swelling in your neck, face, chest, or arm on the side of your central line.   Your central line is hard to rinse out.  You do not get a blood return from the central line.  You have a hole or tear in your central line.   Your central line leaks when rinsed out or when fluids are put into it. MAKE  SURE YOU:   Understand these instructions.   Will watch your condition.   Will get help right away if you are not doing well or get worse.  This information is not intended to replace advice given to you by your health care provider. Make sure you discuss any questions you have with your health care provider. Document Released: 01/11/2012 Document Revised: 05/18/2015 Document Reviewed: 01/11/2012 Elsevier Interactive Patient Education  2017 [employer].

## 2016-01-21 ENCOUNTER — Ambulatory Visit: Payer: PPO

## 2016-01-21 ENCOUNTER — Other Ambulatory Visit: Payer: PPO

## 2016-01-22 DIAGNOSIS — I10 Essential (primary) hypertension: Secondary | ICD-10-CM | POA: Diagnosis not present

## 2016-01-23 DIAGNOSIS — I10 Essential (primary) hypertension: Secondary | ICD-10-CM | POA: Diagnosis not present

## 2016-01-25 DIAGNOSIS — J383 Other diseases of vocal cords: Secondary | ICD-10-CM | POA: Diagnosis not present

## 2016-01-25 DIAGNOSIS — R490 Dysphonia: Secondary | ICD-10-CM | POA: Diagnosis not present

## 2016-01-25 DIAGNOSIS — Z85118 Personal history of other malignant neoplasm of bronchus and lung: Secondary | ICD-10-CM | POA: Diagnosis not present

## 2016-01-25 DIAGNOSIS — J3801 Paralysis of vocal cords and larynx, unilateral: Secondary | ICD-10-CM | POA: Diagnosis not present

## 2016-01-25 DIAGNOSIS — Z87891 Personal history of nicotine dependence: Secondary | ICD-10-CM | POA: Diagnosis not present

## 2016-02-02 DIAGNOSIS — Z1231 Encounter for screening mammogram for malignant neoplasm of breast: Secondary | ICD-10-CM | POA: Diagnosis not present

## 2016-02-03 ENCOUNTER — Other Ambulatory Visit: Payer: Self-pay | Admitting: *Deleted

## 2016-02-03 DIAGNOSIS — C3491 Malignant neoplasm of unspecified part of right bronchus or lung: Secondary | ICD-10-CM

## 2016-02-04 ENCOUNTER — Ambulatory Visit: Payer: PPO

## 2016-02-04 ENCOUNTER — Ambulatory Visit (HOSPITAL_BASED_OUTPATIENT_CLINIC_OR_DEPARTMENT_OTHER): Payer: PPO

## 2016-02-04 ENCOUNTER — Ambulatory Visit (HOSPITAL_BASED_OUTPATIENT_CLINIC_OR_DEPARTMENT_OTHER): Payer: PPO | Admitting: Hematology & Oncology

## 2016-02-04 ENCOUNTER — Encounter: Payer: Self-pay | Admitting: *Deleted

## 2016-02-04 ENCOUNTER — Ambulatory Visit (HOSPITAL_BASED_OUTPATIENT_CLINIC_OR_DEPARTMENT_OTHER)
Admission: RE | Admit: 2016-02-04 | Discharge: 2016-02-04 | Disposition: A | Discharge status: Home / Self Care | Payer: PPO | Source: Ambulatory Visit | Attending: Hematology & Oncology | Admitting: Hematology & Oncology

## 2016-02-04 ENCOUNTER — Other Ambulatory Visit (HOSPITAL_BASED_OUTPATIENT_CLINIC_OR_DEPARTMENT_OTHER): Payer: PPO

## 2016-02-04 DIAGNOSIS — C3490 Malignant neoplasm of unspecified part of unspecified bronchus or lung: Secondary | ICD-10-CM

## 2016-02-04 DIAGNOSIS — J910 Malignant pleural effusion: Secondary | ICD-10-CM | POA: Diagnosis not present

## 2016-02-04 DIAGNOSIS — C7951 Secondary malignant neoplasm of bone: Secondary | ICD-10-CM | POA: Diagnosis not present

## 2016-02-04 DIAGNOSIS — Z5111 Encounter for antineoplastic chemotherapy: Secondary | ICD-10-CM | POA: Diagnosis not present

## 2016-02-04 DIAGNOSIS — C801 Malignant (primary) neoplasm, unspecified: Secondary | ICD-10-CM | POA: Insufficient documentation

## 2016-02-04 DIAGNOSIS — G893 Neoplasm related pain (acute) (chronic): Secondary | ICD-10-CM

## 2016-02-04 DIAGNOSIS — M47812 Spondylosis without myelopathy or radiculopathy, cervical region: Secondary | ICD-10-CM | POA: Diagnosis not present

## 2016-02-04 DIAGNOSIS — C3491 Malignant neoplasm of unspecified part of right bronchus or lung: Secondary | ICD-10-CM

## 2016-02-04 DIAGNOSIS — I10 Essential (primary) hypertension: Secondary | ICD-10-CM

## 2016-02-04 DIAGNOSIS — D508 Other iron deficiency anemias: Secondary | ICD-10-CM

## 2016-02-04 DIAGNOSIS — C3492 Malignant neoplasm of unspecified part of left bronchus or lung: Secondary | ICD-10-CM

## 2016-02-04 LAB — CMP (CANCER CENTER ONLY)
ALBUMIN: 3.6 g/dL (ref 3.3–5.5)
ALT(SGPT): 14 U/L (ref 10–47)
AST: 26 U/L (ref 11–38)
Alkaline Phosphatase: 92 U/L — ABNORMAL HIGH (ref 26–84)
BUN: 9 mg/dL (ref 7–22)
CHLORIDE: 102 meq/L (ref 98–108)
CO2: 27 meq/L (ref 18–33)
Calcium: 8.8 mg/dL (ref 8.0–10.3)
Creat: 0.8 mg/dl (ref 0.6–1.2)
Glucose, Bld: 86 mg/dL (ref 73–118)
POTASSIUM: 3.9 meq/L (ref 3.3–4.7)
Sodium: 140 mEq/L (ref 128–145)
TOTAL PROTEIN: 7.1 g/dL (ref 6.4–8.1)
Total Bilirubin: 0.6 mg/dl (ref 0.20–1.60)

## 2016-02-04 LAB — FERRITIN: FERRITIN: 160 ng/mL (ref 9–269)

## 2016-02-04 LAB — IRON AND TIBC
%SAT: 33 % (ref 21–57)
IRON: 85 ug/dL (ref 41–142)
TIBC: 255 ug/dL (ref 236–444)
UIBC: 170 ug/dL (ref 120–384)

## 2016-02-04 LAB — CBC WITH DIFFERENTIAL (CANCER CENTER ONLY)
BASO#: 0.1 10*3/uL (ref 0.0–0.2)
BASO%: 3 % — AB (ref 0.0–2.0)
EOS%: 1.9 % (ref 0.0–7.0)
Eosinophils Absolute: 0.1 10*3/uL (ref 0.0–0.5)
HCT: 36.6 % (ref 34.8–46.6)
HEMOGLOBIN: 11.8 g/dL (ref 11.6–15.9)
LYMPH#: 1.7 10*3/uL (ref 0.9–3.3)
LYMPH%: 45.5 % (ref 14.0–48.0)
MCH: 27.1 pg (ref 26.0–34.0)
MCHC: 32.2 g/dL (ref 32.0–36.0)
MCV: 84 fL (ref 81–101)
MONO#: 0.7 10*3/uL (ref 0.1–0.9)
MONO%: 18.7 % — AB (ref 0.0–13.0)
NEUT%: 30.9 % — ABNORMAL LOW (ref 39.6–80.0)
NEUTROS ABS: 1.1 10*3/uL — AB (ref 1.5–6.5)
Platelets: 212 10*3/uL (ref 145–400)
RBC: 4.35 10*6/uL (ref 3.70–5.32)
RDW: 18.7 % — ABNORMAL HIGH (ref 11.1–15.7)
WBC: 3.7 10*3/uL — AB (ref 3.9–10.0)

## 2016-02-04 LAB — LACTATE DEHYDROGENASE: LDH: 176 U/L (ref 125–245)

## 2016-02-04 MED ORDER — PROCHLORPERAZINE MALEATE 10 MG PO TABS
ORAL_TABLET | ORAL | Status: AC
  Filled 2016-02-04: qty 1

## 2016-02-04 MED ORDER — AMLODIPINE BESY-BENAZEPRIL HCL 5-10 MG PO CAPS: 1.0000 | ORAL_CAPSULE | Freq: Every day | ORAL | 4 refills | Status: DC

## 2016-02-04 MED ORDER — KETOROLAC TROMETHAMINE 15 MG/ML IJ SOLN
INTRAMUSCULAR | Status: AC
  Filled 2016-02-04: qty 2

## 2016-02-04 MED ORDER — KETOROLAC TROMETHAMINE 30 MG/ML IJ SOLN: 30.0000 mg | Freq: Once | INTRAMUSCULAR | Status: DC

## 2016-02-04 MED ORDER — PACLITAXEL PROTEIN-BOUND CHEMO INJECTION 100 MG
81.0000 mg/m2 | Freq: Once | INTRAVENOUS | Status: AC
  Administered 2016-02-04: 125 mg via INTRAVENOUS
  Filled 2016-02-04: qty 25

## 2016-02-04 MED ORDER — KETOROLAC TROMETHAMINE 15 MG/ML IJ SOLN
30.0000 mg | Freq: Once | INTRAMUSCULAR | Status: AC
  Administered 2016-02-04: 30 mg via INTRAVENOUS

## 2016-02-04 MED ORDER — PROCHLORPERAZINE MALEATE 10 MG PO TABS
10.0000 mg | ORAL_TABLET | Freq: Once | ORAL | Status: AC
  Administered 2016-02-04: 10 mg via ORAL

## 2016-02-04 MED ORDER — HEPARIN SOD (PORK) LOCK FLUSH 100 UNIT/ML IV SOLN
500.0000 [IU] | Freq: Once | INTRAVENOUS | Status: AC | PRN
  Administered 2016-02-04: 500 [IU]
  Filled 2016-02-04: qty 5

## 2016-02-04 MED ORDER — SODIUM CHLORIDE 0.9 % IV SOLN
Freq: Once | INTRAVENOUS | Status: AC
  Administered 2016-02-04: 14:00:00 via INTRAVENOUS

## 2016-02-04 MED ORDER — SODIUM CHLORIDE 0.9% FLUSH
10.0000 mL | INTRAVENOUS | Status: DC | PRN
  Administered 2016-02-04: 10 mL
  Filled 2016-02-04: qty 10

## 2016-02-04 MED ORDER — KETOROLAC TROMETHAMINE 30 MG/ML IJ SOLN
30.0000 mg | Freq: Once | INTRAMUSCULAR | Status: DC
  Filled 2016-02-04: qty 1

## 2016-02-04 NOTE — Progress Notes (Signed)
Patient  Given toradol for c/o neck pain. Was able to sleep and stated it helped her pain

## 2016-02-04 NOTE — Addendum Note (Signed)
Addended by: Burney Gauze R on: 02/04/2016 03:07 PM   Modules accepted: Orders

## 2016-02-04 NOTE — Progress Notes (Signed)
In a pool This is a  Hematology and Oncology Follow Up Visit  [name] [id number] [birth year] 65 y.o. 02/04/2016   Principle Diagnosis:  Metastatic adenocarcinoma the lung - bone metastases, lymph node metastasis and intrapulmonary metastasis Malignant left pleural effusion  Current Therapy:  AbraxaneAvastin -s/p cycle #11 (Avastin held due to HTN) Navelbine/Avastin q 2wk dosing - s/p c#4 - progression  Tecentriq s/p cycle 4 Xgeva 120 mg subcutaneous every month Palliative radiation therapy to the neck.    Interim History:  [name] is here today with her husband. As always, she has some issues. This is no surprise.  She's having more horses. She will go back to see [name]. He did a vocal cord injection previously. It sounds like just do another one.  She also is not complaining of or pain in her neck. She has a lot of arthritis up there. We have done an MRI in the past. She's had radiation. She may need to have another injection.   She also has high blood pressure. This might be from the Avastin. She's not on any blood pressure medication. We probably will have to get her on something.   Thankfully, she did have a nice Thanksgiving and Christmas. She was very nice and gave [place] a "thank you Jesus" sign. I have this in the office. I will take it home New Year's weekend.   Her appetite is okay. She's had some nausea but no vomiting.   She also complains of some pain around her sides. I don't of this might be radicular kind of pain from her back. She does have some disease in her bones. However on her PET scan that was done in October, she only has some activity at L2 and the sacrum.   Overall, her performance status is ECOG 1.   Medications:  Allergies as of 02/04/2016      Reactions   Demerol Other (See Comments)   Syncope Passed out   Topamax Other (See Comments)   Had trouble walking   Ciprofloxacin Other (See Comments)   UNSPECIFIED REACTION    Avelox  [moxifloxacin Hcl In Nacl] Other (See Comments)   Hypotension.   Mefoxin [cefoxitin Sodium In Dextrose] Rash      Medication List       Accurate as of 02/04/16  2:40 PM. Always use your most recent med list.          amLODipine-benazepril 5-10 MG capsule Commonly known as:  LOTREL Take 1 capsule by mouth daily.   budesonide-formoterol 160-4.5 MCG/ACT inhaler Commonly known as:  SYMBICORT Inhale 2 puffs into the lungs 2 (two) times daily as needed (SOB, wheezing).   cyclobenzaprine 5 MG tablet Commonly known as:  FLEXERIL Take 1 tablet (5 mg total) by mouth 3 (three) times daily as needed for muscle spasms.   diphenhydramine-acetaminophen 25-500 MG Tabs tablet Commonly known as:  TYLENOL PM Take 1 tablet by mouth at bedtime as needed (sleep).   HYDROcodone-homatropine 5-1.5 MG/5ML syrup Commonly known as:  HYCODAN Take 5 mLs by mouth every 6 (six) hours as needed for cough.   ibuprofen 200 MG tablet Commonly known as:  ADVIL,MOTRIN Take 600 mg by mouth every 6 (six) hours as needed for mild pain.   KLOR-CON M20 20 MEQ tablet Generic drug:  potassium chloride SA TAKE 1 TABLET BY MOUTH DAILY   lactulose 10 GM/15ML solution Commonly known as:  CHRONULAC Take 15 mLs (10 g total) by mouth daily. Reported on 07/22/2015  lidocaine-prilocaine cream Commonly known as:  EMLA Apply 1 application topically daily as needed (pain).   loratadine 10 MG tablet Commonly known as:  CLARITIN Take 10 mg by mouth daily as needed for allergies.   omeprazole 40 MG capsule Commonly known as:  PRILOSEC Take 40 mg by mouth daily as needed (heartburn).   oxyCODONE 10 mg 12 hr tablet Commonly known as:  OXYCONTIN Take 1 tablet (10 mg total) by mouth every 12 (twelve) hours.   OxyCODONE HCl (Abuse Deter) 5 MG Taba Commonly known as:  OXAYDO Take 5 mg by mouth every 4 (four) hours as needed (pain).   polyethylene glycol powder powder Commonly known as:  GLYCOLAX/MIRALAX Take 17 g  by mouth once. Dissolve 1 capful of powder into any liquid and take once daily. Can increase to 2 times daily if no effect after 3-4 days.   PROAIR RESPICLICK 578 (90 Base) MCG/ACT Aepb Generic drug:  Albuterol Sulfate Inhale 2 puffs into the lungs twice daily as needed for shortness of breath or wheezing   prochlorperazine 10 MG tablet Commonly known as:  COMPAZINE Take 10 mg by mouth every 6 (six) hours as needed for nausea or vomiting.   senna-docusate 8.6-50 MG tablet Commonly known as:  Senokot-S Take 1 tablet by mouth daily.   SUMAtriptan 100 MG tablet Commonly known as:  IMITREX Take 100 mg by mouth as needed for migraine or headache. Reported on 04/01/2015       Allergies:  Allergies  Allergen Reactions  . Demerol Other (See Comments)    Syncope Passed out  . Topamax Other (See Comments)    Had trouble walking  . Ciprofloxacin Other (See Comments)    UNSPECIFIED REACTION   . Avelox [Moxifloxacin Hcl In Nacl] Other (See Comments)    Hypotension.  . Mefoxin [Cefoxitin Sodium In Dextrose] Rash    Past Medical History, Surgical history, Social history, and Family History were reviewed and updated.  Review of Systems: All other 10 point review of systems is negative.   Physical Exam:  vitals were not taken for this visit.  Wt Readings from Last 3 Encounters:  02/04/16 132 lb 4 oz (60 kg)  01/07/16 133 lb 12.8 oz (60.7 kg)  12/10/15 133 lb 8 oz (60.6 kg)    Well-developed and well-nourished white female in no obvious distress. Head and neck exam shows no ocular or oral lesions. There are no palpable cervical or supraclavicular lymph nodes. Lungs are clear on the right side. There is still some slight decrease breath sounds on the left side. Cardiac exam regular rate and rhythm with no murmurs, rubs or bruits. Abdomen is soft. She is good bowel sounds. There is no fluid wave. There is no palpable liver or spleen tip. Back exam shows no tenderness over the spine, ribs  or hips. Extremities shows no clubbing, cyanosis or edema. Skin exam shows no rashes, ecchymoses or petechia. Neurological exam shows no focal neurological deficits.   Lab Results  Component Value Date   WBC 3.7 (L) 02/04/2016   HGB 11.8 02/04/2016   HCT 36.6 02/04/2016   MCV 84 02/04/2016   PLT 212 02/04/2016   Lab Results  Component Value Date   FERRITIN 160 02/04/2016   IRON 85 02/04/2016   TIBC 255 02/04/2016   UIBC 170 02/04/2016   IRONPCTSAT 33 02/04/2016   Lab Results  Component Value Date   RETICCTPCT 1.4 08/13/2008   RBC 4.35 02/04/2016   RETICCTABS 61.6 08/13/2008   No  results found for: KPAFRELGTCHN, LAMBDASER, KAPLAMBRATIO No results found for: IGGSERUM, IGA, IGMSERUM No results found for: Odetta Pink, SPEI   Chemistry      Component Value Date/Time   NA 140 02/04/2016 1125   NA 140 07/22/2015 1255   K 3.9 02/04/2016 1125   K 4.0 07/22/2015 1255   CL 102 02/04/2016 1125   CO2 27 02/04/2016 1125   CO2 27 07/22/2015 1255   BUN 9 02/04/2016 1125   BUN 9.8 07/22/2015 1255   CREATININE 0.8 02/04/2016 1125   CREATININE 0.6 07/22/2015 1255      Component Value Date/Time   CALCIUM 8.8 02/04/2016 1125   CALCIUM 9.1 07/22/2015 1255   ALKPHOS 92 (H) 02/04/2016 1125   AST 26 02/04/2016 1125   ALT 14 02/04/2016 1125   BILITOT 0.60 02/04/2016 1125     Impression and Plan: Ms. Shankles is a pleasant 65 yo white female with metastatic adenocarcinoma of the lung. She has no "Driver" mutations that we can target.  Again, we will hold the Avastin because of her blood pressure. I will start her on Lotrel (5/10) to try to get her blood pressure down. I think Avastin is helpful for her protocol.  I probably would not do another PET scan on her until February.  I will hold off on doing any x-rays for her back right now. I would not think that there is any kind of compression fracture.   I will see her actinic in  another couple weeks. Hopefully, she will of had her neck fixed. She will have epidural injection into the neck I think a couple weeks.   I spent about 25 minutes with she and her husband. There are very nice and gave [place] a nice gift of apple butter that her her mom made. ll plan to get her back in another couple weeks.     Volanda Napoleon, MD 12/28/20172:40 PM

## 2016-02-05 ENCOUNTER — Telehealth: Payer: Self-pay | Admitting: *Deleted

## 2016-02-05 DIAGNOSIS — J0100 Acute maxillary sinusitis, unspecified: Secondary | ICD-10-CM | POA: Diagnosis not present

## 2016-02-05 NOTE — Telephone Encounter (Addendum)
Patient is aware of results  ----- Message from Volanda Napoleon, MD sent at 02/05/2016  9:30 AM EST ----- Call - No compression fracture.  There are areas of metastatic cancer in the spine but this is very stable!!  pete

## 2016-02-16 ENCOUNTER — Other Ambulatory Visit: Payer: Self-pay | Admitting: *Deleted

## 2016-02-16 DIAGNOSIS — C3490 Malignant neoplasm of unspecified part of unspecified bronchus or lung: Secondary | ICD-10-CM

## 2016-02-17 ENCOUNTER — Other Ambulatory Visit (HOSPITAL_BASED_OUTPATIENT_CLINIC_OR_DEPARTMENT_OTHER): Payer: PPO

## 2016-02-17 ENCOUNTER — Ambulatory Visit: Payer: PPO

## 2016-02-17 ENCOUNTER — Ambulatory Visit (HOSPITAL_BASED_OUTPATIENT_CLINIC_OR_DEPARTMENT_OTHER): Payer: PPO

## 2016-02-17 VITALS — BP 118/79 | HR 72 | Temp 97.8°F

## 2016-02-17 DIAGNOSIS — C3490 Malignant neoplasm of unspecified part of unspecified bronchus or lung: Secondary | ICD-10-CM

## 2016-02-17 DIAGNOSIS — Z5111 Encounter for antineoplastic chemotherapy: Secondary | ICD-10-CM | POA: Diagnosis not present

## 2016-02-17 DIAGNOSIS — Z5112 Encounter for antineoplastic immunotherapy: Secondary | ICD-10-CM

## 2016-02-17 DIAGNOSIS — C7951 Secondary malignant neoplasm of bone: Secondary | ICD-10-CM

## 2016-02-17 DIAGNOSIS — C3491 Malignant neoplasm of unspecified part of right bronchus or lung: Secondary | ICD-10-CM

## 2016-02-17 DIAGNOSIS — C342 Malignant neoplasm of middle lobe, bronchus or lung: Secondary | ICD-10-CM

## 2016-02-17 DIAGNOSIS — J910 Malignant pleural effusion: Secondary | ICD-10-CM

## 2016-02-17 DIAGNOSIS — C3492 Malignant neoplasm of unspecified part of left bronchus or lung: Secondary | ICD-10-CM

## 2016-02-17 LAB — CMP (CANCER CENTER ONLY)
ALK PHOS: 84 U/L (ref 26–84)
ALT(SGPT): 19 U/L (ref 10–47)
AST: 19 U/L (ref 11–38)
Albumin: 3.4 g/dL (ref 3.3–5.5)
BUN: 14 mg/dL (ref 7–22)
CO2: 24 mEq/L (ref 18–33)
CREATININE: 0.5 mg/dL — AB (ref 0.6–1.2)
Calcium: 8.7 mg/dL (ref 8.0–10.3)
Chloride: 107 mEq/L (ref 98–108)
Glucose, Bld: 106 mg/dL (ref 73–118)
POTASSIUM: 3.8 meq/L (ref 3.3–4.7)
SODIUM: 139 meq/L (ref 128–145)
TOTAL PROTEIN: 6.7 g/dL (ref 6.4–8.1)
Total Bilirubin: 0.6 mg/dl (ref 0.20–1.60)

## 2016-02-17 LAB — CBC WITH DIFFERENTIAL (CANCER CENTER ONLY)
BASO#: 0.1 10*3/uL (ref 0.0–0.2)
BASO%: 1.1 % (ref 0.0–2.0)
EOS%: 2 % (ref 0.0–7.0)
Eosinophils Absolute: 0.2 10*3/uL (ref 0.0–0.5)
HCT: 36.4 % (ref 34.8–46.6)
HEMOGLOBIN: 11.7 g/dL (ref 11.6–15.9)
LYMPH#: 2 10*3/uL (ref 0.9–3.3)
LYMPH%: 26.2 % (ref 14.0–48.0)
MCH: 27.7 pg (ref 26.0–34.0)
MCHC: 32.1 g/dL (ref 32.0–36.0)
MCV: 86 fL (ref 81–101)
MONO#: 0.9 10*3/uL (ref 0.1–0.9)
MONO%: 12.1 % (ref 0.0–13.0)
NEUT%: 58.6 % (ref 39.6–80.0)
NEUTROS ABS: 4.4 10*3/uL (ref 1.5–6.5)
PLATELETS: 226 10*3/uL (ref 145–400)
RBC: 4.23 10*6/uL (ref 3.70–5.32)
RDW: 19.3 % — ABNORMAL HIGH (ref 11.1–15.7)
WBC: 7.5 10*3/uL (ref 3.9–10.0)

## 2016-02-17 LAB — TECHNOLOGIST REVIEW CHCC SATELLITE

## 2016-02-17 LAB — UA PROTEIN, DIPSTICK - CHCC SATELLITE: Protein, Urine: <30 mg/dL

## 2016-02-17 MED ORDER — PROCHLORPERAZINE MALEATE 10 MG PO TABS
10.0000 mg | ORAL_TABLET | Freq: Once | ORAL | Status: AC
  Administered 2016-02-17: 10 mg via ORAL

## 2016-02-17 MED ORDER — SODIUM CHLORIDE 0.9% FLUSH
10.0000 mL | INTRAVENOUS | Status: DC | PRN
  Administered 2016-02-17: 10 mL
  Filled 2016-02-17: qty 10

## 2016-02-17 MED ORDER — PROCHLORPERAZINE MALEATE 10 MG PO TABS
ORAL_TABLET | ORAL | Status: AC
  Filled 2016-02-17: qty 1

## 2016-02-17 MED ORDER — SODIUM CHLORIDE 0.9 % IV SOLN
Freq: Once | INTRAVENOUS | Status: AC
  Administered 2016-02-17: 13:00:00 via INTRAVENOUS

## 2016-02-17 MED ORDER — DENOSUMAB 120 MG/1.7ML ~~LOC~~ SOLN
120.0000 mg | Freq: Once | SUBCUTANEOUS | Status: AC
  Administered 2016-02-17: 120 mg via SUBCUTANEOUS
  Filled 2016-02-17: qty 1.7

## 2016-02-17 MED ORDER — PACLITAXEL PROTEIN-BOUND CHEMO INJECTION 100 MG
81.0000 mg/m2 | Freq: Once | INTRAVENOUS | Status: AC
  Administered 2016-02-17: 125 mg via INTRAVENOUS
  Filled 2016-02-17: qty 25

## 2016-02-17 MED ORDER — BEVACIZUMAB CHEMO INJECTION 400 MG/16ML
10.5000 mg/kg | Freq: Once | INTRAVENOUS | Status: AC
  Administered 2016-02-17: 600 mg via INTRAVENOUS
  Filled 2016-02-17: qty 16

## 2016-02-17 MED ORDER — HEPARIN SOD (PORK) LOCK FLUSH 100 UNIT/ML IV SOLN
500.0000 [IU] | Freq: Once | INTRAVENOUS | Status: AC | PRN
  Administered 2016-02-17: 500 [IU]
  Filled 2016-02-17: qty 5

## 2016-02-17 NOTE — Patient Instructions (Addendum)
Denosumab injection What is this medicine? DENOSUMAB (den oh sue mab) slows bone breakdown. Prolia is used to treat osteoporosis in women after menopause and in men. Xgeva is used to prevent bone fractures and other bone problems caused by cancer bone metastases. Xgeva is also used to treat giant cell tumor of the bone. COMMON BRAND NAME(S): Prolia, XGEVA What should I tell my health care provider before I take this medicine? They need to know if you have any of these conditions: -dental disease -eczema -infection or history of infections -kidney disease or on dialysis -low blood calcium or vitamin D -malabsorption syndrome -scheduled to have surgery or tooth extraction -taking medicine that contains denosumab -thyroid or parathyroid disease -an unusual reaction to denosumab, other medicines, foods, dyes, or preservatives -pregnant or trying to get pregnant -breast-feeding How should I use this medicine? This medicine is for injection under the skin. It is given by a health care professional in a hospital or clinic setting. If you are getting Prolia, a special MedGuide will be given to you by the pharmacist with each prescription and refill. Be sure to read this information carefully each time. For Prolia, talk to your pediatrician regarding the use of this medicine in children. Special care may be needed. For Xgeva, talk to your pediatrician regarding the use of this medicine in children. While this drug may be prescribed for children as young as 13 years for selected conditions, precautions do apply. What if I miss a dose? It is important not to miss your dose. Call your doctor or health care professional if you are unable to keep an appointment. What may interact with this medicine? Do not take this medicine with any of the following medications: -other medicines containing denosumab This medicine may also interact with the following medications: -medicines that suppress the immune  system -medicines that treat cancer -steroid medicines like prednisone or cortisone What should I watch for while using this medicine? Visit your doctor or health care professional for regular checks on your progress. Your doctor or health care professional may order blood tests and other tests to see how you are doing. Call your doctor or health care professional if you get a cold or other infection while receiving this medicine. Do not treat yourself. This medicine may decrease your body's ability to fight infection. You should make sure you get enough calcium and vitamin D while you are taking this medicine, unless your doctor tells you not to. Discuss the foods you eat and the vitamins you take with your health care professional. See your dentist regularly. Brush and floss your teeth as directed. Before you have any dental work done, tell your dentist you are receiving this medicine. Do not become pregnant while taking this medicine or for 5 months after stopping it. Women should inform their doctor if they wish to become pregnant or think they might be pregnant. There is a potential for serious side effects to an unborn child. Talk to your health care professional or pharmacist for more information. What side effects may I notice from receiving this medicine? Side effects that you should report to your doctor or health care professional as soon as possible: -allergic reactions like skin rash, itching or hives, swelling of the face, lips, or tongue -breathing problems -chest pain -fast, irregular heartbeat -feeling faint or lightheaded, falls -fever, chills, or any other sign of infection -muscle spasms, tightening, or twitches -numbness or tingling -skin blisters or bumps, or is dry, peels, or red -slow   healing or unexplained pain in the mouth or jaw -unusual bleeding or bruising Side effects that usually do not require medical attention (report to your doctor or health care professional  if they continue or are bothersome): -muscle pain -stomach upset, gas Where should I keep my medicine? This medicine is only given in a clinic, doctor's office, or other health care setting and will not be stored at home.  2017 [employer]/Gold Standard (2015-02-26 10:06:55) Bevacizumab injection What is this medicine? BEVACIZUMAB (be va SIZ yoo mab) is a monoclonal antibody. It is used to treat cervical cancer, colorectal cancer, glioblastoma multiforme, non-small cell lung cancer (NSCLC), ovarian cancer, and renal cell cancer. This medicine may be used for other purposes; ask your health care provider or pharmacist if you have questions. COMMON BRAND NAME(S): Avastin What should I tell my health care provider before I take this medicine? They need to know if you have any of these conditions: -blood clots -heart disease, including heart failure, heart attack, or chest pain (angina) -high blood pressure -infection (especially a virus infection such as chickenpox, cold sores, or herpes) -kidney disease -lung disease -prior chemotherapy with doxorubicin, daunorubicin, epirubicin, or other anthracycline type chemotherapy agents -recent or ongoing radiation therapy -recent surgery -stroke -an unusual or allergic reaction to bevacizumab, hamster proteins, mouse proteins, other medicines, foods, dyes, or preservatives -pregnant or trying to get pregnant -breast-feeding How should I use this medicine? This medicine is for infusion into a vein. It is given by a health care professional in a hospital or clinic setting. Talk to your pediatrician regarding the use of this medicine in children. Special care may be needed. Overdosage: If you think you have taken too much of this medicine contact a poison control center or emergency room at once. NOTE: This medicine is only for you. Do not share this medicine with others. What if I miss a dose? It is important not to miss your dose. Call your doctor  or health care professional if you are unable to keep an appointment. What may interact with this medicine? Interactions are not expected. This list may not describe all possible interactions. Give your health care provider a list of all the medicines, herbs, non-prescription drugs, or dietary supplements you use. Also tell them if you smoke, drink alcohol, or use illegal drugs. Some items may interact with your medicine. What should I watch for while using this medicine? Your condition will be monitored carefully while you are receiving this medicine. You will need important blood work and urine testing done while you are taking this medicine. During your treatment, let your health care professional know if you have any unusual symptoms, such as difficulty breathing. This medicine may rarely cause 'gastrointestinal perforation' (holes in the stomach, intestines or colon), a serious side effect requiring surgery to repair. This medicine should be started at least 28 days following major surgery and the site of the surgery should be totally healed. Check with your doctor before scheduling dental work or surgery while you are receiving this treatment. Talk to your doctor if you have recently had surgery or if you have a wound that has not healed. Do not become pregnant while taking this medicine or for 6 months after stopping it. Women should inform their doctor if they wish to become pregnant or think they might be pregnant. There is a potential for serious side effects to an unborn child. Talk to your health care professional or pharmacist for more information. Do not breast-feed an infant  while taking this medicine. This medicine has caused ovarian failure in some women. This medicine may interfere with the ability to have a child. You should talk to your doctor or health care professional if you are concerned about your fertility. What side effects may I notice from receiving this medicine? Side  effects that you should report to your doctor or health care professional as soon as possible: -allergic reactions like skin rash, itching or hives, swelling of the face, lips, or tongue -breathing problems -changes in vision -chest pain -confusion -jaw pain, especially after dental work -mouth sores -seizures -severe abdominal pain -severe headache -signs of decreased platelets or bleeding - bruising, pinpoint red spots on the skin, black, tarry stools, nosebleeds, blood in the urine -signs of infection - fever or chills, cough, sore throat, pain or trouble passing urine -sudden numbness or weakness of the face, arm or leg -swelling of legs or ankles -symptoms of a stroke: change in mental awareness, inability to talk or move one side of the body (especially in patients with lung cancer) -trouble passing urine or change in the amount of urine -trouble speaking or understanding -trouble walking, dizziness, loss of balance or coordination Side effects that usually do not require medical attention (report to your doctor or health care professional if they continue or are bothersome): -constipation -diarrhea -dry skin -headache -loss of appetite -nausea, vomiting This list may not describe all possible side effects. Call your doctor for medical advice about side effects. You may report side effects to FDA at 1-800-FDA-1088. Where should I keep my medicine? This drug is given in a hospital or clinic and will not be stored at home. NOTE: This sheet is a summary. It may not cover all possible information. If you have questions about this medicine, talk to your doctor, pharmacist, or health care provider.  2017 [employer]/Gold Standard (2015-01-16 15:28:53) Nanoparticle Albumin-Bound Paclitaxel injection What is this medicine? NANOPARTICLE ALBUMIN-BOUND PACLITAXEL (Na no PAHR ti kuhl al BYOO muhn-bound PAK li TAX el) is a chemotherapy drug. It targets fast dividing cells, like cancer cells,  and causes these cells to die. This medicine is used to treat advanced breast cancer and advanced lung cancer. This medicine may be used for other purposes; ask your health care provider or pharmacist if you have questions. COMMON BRAND NAME(S): Abraxane What should I tell my health care provider before I take this medicine? They need to know if you have any of these conditions: -kidney disease -liver disease -low blood counts, like low platelets, red blood cells, or white blood cells -recent or ongoing radiation therapy -an unusual or allergic reaction to paclitaxel, albumin, other chemotherapy, other medicines, foods, dyes, or preservatives -pregnant or trying to get pregnant -breast-feeding How should I use this medicine? This drug is given as an infusion into a vein. It is administered in a hospital or clinic by a specially trained health care professional. Talk to your pediatrician regarding the use of this medicine in children. Special care may be needed. Overdosage: If you think you have taken too much of this medicine contact a poison control center or emergency room at once. NOTE: This medicine is only for you. Do not share this medicine with others. What if I miss a dose? It is important not to miss your dose. Call your doctor or health care professional if you are unable to keep an appointment. What may interact with this medicine? -cyclosporine -diazepam -ketoconazole -medicines to increase blood counts like filgrastim, pegfilgrastim, sargramostim -other  chemotherapy drugs like cisplatin, doxorubicin, epirubicin, etoposide, teniposide, vincristine -quinidine -testosterone -vaccines -verapamil Talk to your doctor or health care professional before taking any of these medicines: -acetaminophen -aspirin -ibuprofen -ketoprofen -naproxen This list may not describe all possible interactions. Give your health care provider a list of all the medicines, herbs, non-prescription  drugs, or dietary supplements you use. Also tell them if you smoke, drink alcohol, or use illegal drugs. Some items may interact with your medicine. What should I watch for while using this medicine? Your condition will be monitored carefully while you are receiving this medicine. You will need important blood work done while you are taking this medicine. This medicine can cause serious allergic reactions. If you experience allergic reactions like skin rash, itching or hives, swelling of the face, lips, or tongue, tell your doctor or health care professional right away. In some cases, you may be given additional medicines to help with side effects. Follow all directions for their use. This drug may make you feel generally unwell. This is not uncommon, as chemotherapy can affect healthy cells as well as cancer cells. Report any side effects. Continue your course of treatment even though you feel ill unless your doctor tells you to stop. Call your doctor or health care professional for advice if you get a fever, chills or sore throat, or other symptoms of a cold or flu. Do not treat yourself. This drug decreases your body's ability to fight infections. Try to avoid being around people who are sick. This medicine may increase your risk to bruise or bleed. Call your doctor or health care professional if you notice any unusual bleeding. Be careful brushing and flossing your teeth or using a toothpick because you may get an infection or bleed more easily. If you have any dental work done, tell your dentist you are receiving this medicine. Avoid taking products that contain aspirin, acetaminophen, ibuprofen, naproxen, or ketoprofen unless instructed by your doctor. These medicines may hide a fever. Do not become pregnant while taking this medicine. Women should inform their doctor if they wish to become pregnant or think they might be pregnant. There is a potential for serious side effects to an unborn child.  Talk to your health care professional or pharmacist for more information. Do not breast-feed an infant while taking this medicine. Men are advised not to father a child while receiving this medicine. What side effects may I notice from receiving this medicine? Side effects that you should report to your doctor or health care professional as soon as possible: -allergic reactions like skin rash, itching or hives, swelling of the face, lips, or tongue -low blood counts - This drug may decrease the number of white blood cells, red blood cells and platelets. You may be at increased risk for infections and bleeding. -signs of infection - fever or chills, cough, sore throat, pain or difficulty passing urine -signs of decreased platelets or bleeding - bruising, pinpoint red spots on the skin, black, tarry stools, nosebleeds -signs of decreased red blood cells - unusually weak or tired, fainting spells, lightheadedness -breathing problems -changes in vision -chest pain -high or low blood pressure -mouth sores -nausea and vomiting -pain, swelling, redness or irritation at the injection site -pain, tingling, numbness in the hands or feet -slow or irregular heartbeat -swelling of the ankle, feet, hands Side effects that usually do not require medical attention (report to your doctor or health care professional if they continue or are bothersome): -aches, pains -changes in the  color of fingernails -diarrhea -hair loss -loss of appetite This list may not describe all possible side effects. Call your doctor for medical advice about side effects. You may report side effects to FDA at 1-800-FDA-1088. Where should I keep my medicine? This drug is given in a hospital or clinic and will not be stored at home. NOTE: This sheet is a summary. It may not cover all possible information. If you have questions about this medicine, talk to your doctor, pharmacist, or health care provider.  2017 Elsevier/Gold  Standard (2014-11-26 10:05:20) Denosumab injection What is this medicine? DENOSUMAB (den oh sue mab) slows bone breakdown. Prolia is used to treat osteoporosis in women after menopause and in men. Delton See is used to prevent bone fractures and other bone problems caused by cancer bone metastases. Delton See is also used to treat giant cell tumor of the bone. COMMON BRAND NAME(S): Prolia, XGEVA What should I tell my health care provider before I take this medicine? They need to know if you have any of these conditions: -dental disease -eczema -infection or history of infections -kidney disease or on dialysis -low blood calcium or vitamin D -malabsorption syndrome -scheduled to have surgery or tooth extraction -taking medicine that contains denosumab -thyroid or parathyroid disease -an unusual reaction to denosumab, other medicines, foods, dyes, or preservatives -pregnant or trying to get pregnant -breast-feeding How should I use this medicine? This medicine is for injection under the skin. It is given by a health care professional in a hospital or clinic setting. If you are getting Prolia, a special MedGuide will be given to you by the pharmacist with each prescription and refill. Be sure to read this information carefully each time. For Prolia, talk to your pediatrician regarding the use of this medicine in children. Special care may be needed. For Delton See, talk to your pediatrician regarding the use of this medicine in children. While this drug may be prescribed for children as young as 13 years for selected conditions, precautions do apply. What if I miss a dose? It is important not to miss your dose. Call your doctor or health care professional if you are unable to keep an appointment. What may interact with this medicine? Do not take this medicine with any of the following medications: -other medicines containing denosumab This medicine may also interact with the following  medications: -medicines that suppress the immune system -medicines that treat cancer -steroid medicines like prednisone or cortisone What should I watch for while using this medicine? Visit your doctor or health care professional for regular checks on your progress. Your doctor or health care professional may order blood tests and other tests to see how you are doing. Call your doctor or health care professional if you get a cold or other infection while receiving this medicine. Do not treat yourself. This medicine may decrease your body's ability to fight infection. You should make sure you get enough calcium and vitamin D while you are taking this medicine, unless your doctor tells you not to. Discuss the foods you eat and the vitamins you take with your health care professional. See your dentist regularly. Brush and floss your teeth as directed. Before you have any dental work done, tell your dentist you are receiving this medicine. Do not become pregnant while taking this medicine or for 5 months after stopping it. Women should inform their doctor if they wish to become pregnant or think they might be pregnant. There is a potential for serious side effects to an  unborn child. Talk to your health care professional or pharmacist for more information. What side effects may I notice from receiving this medicine? Side effects that you should report to your doctor or health care professional as soon as possible: -allergic reactions like skin rash, itching or hives, swelling of the face, lips, or tongue -breathing problems -chest pain -fast, irregular heartbeat -feeling faint or lightheaded, falls -fever, chills, or any other sign of infection -muscle spasms, tightening, or twitches -numbness or tingling -skin blisters or bumps, or is dry, peels, or red -slow healing or unexplained pain in the mouth or jaw -unusual bleeding or bruising Side effects that usually do not require medical attention  (report to your doctor or health care professional if they continue or are bothersome): -muscle pain -stomach upset, gas Where should I keep my medicine? This medicine is only given in a clinic, doctor's office, or other health care setting and will not be stored at home.  2017 [employer]/Gold Standard (2015-02-26 10:06:55)

## 2016-02-17 NOTE — Patient Instructions (Addendum)

## 2016-02-18 ENCOUNTER — Ambulatory Visit: Payer: PPO

## 2016-02-18 ENCOUNTER — Other Ambulatory Visit: Payer: PPO

## 2016-02-18 DIAGNOSIS — M47812 Spondylosis without myelopathy or radiculopathy, cervical region: Secondary | ICD-10-CM | POA: Diagnosis not present

## 2016-03-02 ENCOUNTER — Other Ambulatory Visit (HOSPITAL_BASED_OUTPATIENT_CLINIC_OR_DEPARTMENT_OTHER): Payer: PPO

## 2016-03-02 ENCOUNTER — Ambulatory Visit: Payer: PPO

## 2016-03-02 ENCOUNTER — Ambulatory Visit (HOSPITAL_BASED_OUTPATIENT_CLINIC_OR_DEPARTMENT_OTHER): Payer: PPO | Admitting: Hematology & Oncology

## 2016-03-02 ENCOUNTER — Ambulatory Visit (HOSPITAL_BASED_OUTPATIENT_CLINIC_OR_DEPARTMENT_OTHER): Payer: PPO

## 2016-03-02 ENCOUNTER — Encounter: Payer: Self-pay | Admitting: Hematology & Oncology

## 2016-03-02 VITALS — BP 110/78 | HR 89 | Temp 97.9°F | Resp 18

## 2016-03-02 DIAGNOSIS — Z5111 Encounter for antineoplastic chemotherapy: Secondary | ICD-10-CM

## 2016-03-02 DIAGNOSIS — C3490 Malignant neoplasm of unspecified part of unspecified bronchus or lung: Secondary | ICD-10-CM

## 2016-03-02 DIAGNOSIS — C7951 Secondary malignant neoplasm of bone: Secondary | ICD-10-CM | POA: Diagnosis not present

## 2016-03-02 DIAGNOSIS — Z5112 Encounter for antineoplastic immunotherapy: Secondary | ICD-10-CM

## 2016-03-02 DIAGNOSIS — J910 Malignant pleural effusion: Secondary | ICD-10-CM | POA: Diagnosis not present

## 2016-03-02 DIAGNOSIS — M47812 Spondylosis without myelopathy or radiculopathy, cervical region: Secondary | ICD-10-CM

## 2016-03-02 DIAGNOSIS — I10 Essential (primary) hypertension: Secondary | ICD-10-CM

## 2016-03-02 DIAGNOSIS — C3491 Malignant neoplasm of unspecified part of right bronchus or lung: Secondary | ICD-10-CM

## 2016-03-02 DIAGNOSIS — C3492 Malignant neoplasm of unspecified part of left bronchus or lung: Secondary | ICD-10-CM

## 2016-03-02 LAB — CMP (CANCER CENTER ONLY)
ALT(SGPT): 17 U/L (ref 10–47)
AST: 20 U/L (ref 11–38)
Albumin: 3.6 g/dL (ref 3.3–5.5)
Alkaline Phosphatase: 85 U/L — ABNORMAL HIGH (ref 26–84)
BUN: 13 mg/dL (ref 7–22)
CHLORIDE: 103 meq/L (ref 98–108)
CO2: 26 meq/L (ref 18–33)
CREATININE: 0.6 mg/dL (ref 0.6–1.2)
Calcium: 8.4 mg/dL (ref 8.0–10.3)
GLUCOSE: 110 mg/dL (ref 73–118)
Potassium: 3.8 mEq/L (ref 3.3–4.7)
SODIUM: 138 meq/L (ref 128–145)
Total Bilirubin: 0.6 mg/dl (ref 0.20–1.60)
Total Protein: 7 g/dL (ref 6.4–8.1)

## 2016-03-02 LAB — CBC WITH DIFFERENTIAL (CANCER CENTER ONLY)
BASO#: 0.1 10*3/uL (ref 0.0–0.2)
BASO%: 1.1 % (ref 0.0–2.0)
EOS ABS: 0.1 10*3/uL (ref 0.0–0.5)
EOS%: 1.3 % (ref 0.0–7.0)
HCT: 35.3 % (ref 34.8–46.6)
HGB: 11.3 g/dL — ABNORMAL LOW (ref 11.6–15.9)
LYMPH#: 1.7 10*3/uL (ref 0.9–3.3)
LYMPH%: 23.6 % (ref 14.0–48.0)
MCH: 27.6 pg (ref 26.0–34.0)
MCHC: 32 g/dL (ref 32.0–36.0)
MCV: 86 fL (ref 81–101)
MONO#: 0.7 10*3/uL (ref 0.1–0.9)
MONO%: 9.7 % (ref 0.0–13.0)
NEUT#: 4.5 10*3/uL (ref 1.5–6.5)
NEUT%: 64.3 % (ref 39.6–80.0)
PLATELETS: 227 10*3/uL (ref 145–400)
RBC: 4.1 10*6/uL (ref 3.70–5.32)
RDW: 18.9 % — ABNORMAL HIGH (ref 11.1–15.7)
WBC: 7 10*3/uL (ref 3.9–10.0)

## 2016-03-02 MED ORDER — SODIUM CHLORIDE 0.9% FLUSH
3.0000 mL | INTRAVENOUS | Status: DC | PRN
  Filled 2016-03-02: qty 10

## 2016-03-02 MED ORDER — SODIUM CHLORIDE 0.9 % IV SOLN
Freq: Once | INTRAVENOUS | Status: AC
  Administered 2016-03-02: 15:00:00 via INTRAVENOUS

## 2016-03-02 MED ORDER — PACLITAXEL PROTEIN-BOUND CHEMO INJECTION 100 MG
81.0000 mg/m2 | Freq: Once | INTRAVENOUS | Status: AC
  Administered 2016-03-02: 125 mg via INTRAVENOUS
  Filled 2016-03-02: qty 25

## 2016-03-02 MED ORDER — PROCHLORPERAZINE MALEATE 10 MG PO TABS
ORAL_TABLET | ORAL | Status: AC
  Filled 2016-03-02: qty 1

## 2016-03-02 MED ORDER — HEPARIN SOD (PORK) LOCK FLUSH 100 UNIT/ML IV SOLN
500.0000 [IU] | Freq: Once | INTRAVENOUS | Status: AC | PRN
  Administered 2016-03-02: 500 [IU]
  Filled 2016-03-02: qty 5

## 2016-03-02 MED ORDER — PROCHLORPERAZINE MALEATE 10 MG PO TABS
10.0000 mg | ORAL_TABLET | Freq: Once | ORAL | Status: AC
  Administered 2016-03-02: 10 mg via ORAL

## 2016-03-02 MED ORDER — SODIUM CHLORIDE 0.9 % IV SOLN
Freq: Once | INTRAVENOUS | Status: AC
  Administered 2016-03-02: 14:00:00 via INTRAVENOUS

## 2016-03-02 MED ORDER — SODIUM CHLORIDE 0.9 % IV SOLN
10.5000 mg/kg | Freq: Once | INTRAVENOUS | Status: AC
  Administered 2016-03-02: 600 mg via INTRAVENOUS
  Filled 2016-03-02: qty 16

## 2016-03-02 MED ORDER — SODIUM CHLORIDE 0.9% FLUSH
10.0000 mL | INTRAVENOUS | Status: DC | PRN
  Administered 2016-03-02: 10 mL
  Filled 2016-03-02: qty 10

## 2016-03-02 MED ORDER — HEPARIN SOD (PORK) LOCK FLUSH 100 UNIT/ML IV SOLN
250.0000 [IU] | Freq: Once | INTRAVENOUS | Status: DC | PRN
  Filled 2016-03-02: qty 5

## 2016-03-02 MED ORDER — ALTEPLASE 2 MG IJ SOLR
2.0000 mg | Freq: Once | INTRAMUSCULAR | Status: DC | PRN
  Filled 2016-03-02: qty 2
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
## 2016-03-02 NOTE — Patient Instructions (Signed)
Nanoparticle Albumin-Bound Paclitaxel injection What is this medicine? NANOPARTICLE ALBUMIN-BOUND PACLITAXEL (Na no PAHR ti kuhl al BYOO muhn-bound PAK li TAX el) is a chemotherapy drug. It targets fast dividing cells, like cancer cells, and causes these cells to die. This medicine is used to treat advanced breast cancer and advanced lung cancer. This medicine may be used for other purposes; ask your health care provider or pharmacist if you have questions. COMMON BRAND NAME(S): Abraxane What should I tell my health care provider before I take this medicine? They need to know if you have any of these conditions: -kidney disease -liver disease -low blood counts, like low platelets, red blood cells, or white blood cells -recent or ongoing radiation therapy -an unusual or allergic reaction to paclitaxel, albumin, other chemotherapy, other medicines, foods, dyes, or preservatives -pregnant or trying to get pregnant -breast-feeding How should I use this medicine? This drug is given as an infusion into a vein. It is administered in a hospital or clinic by a specially trained health care professional. Talk to your pediatrician regarding the use of this medicine in children. Special care may be needed. Overdosage: If you think you have taken too much of this medicine contact a poison control center or emergency room at once. NOTE: This medicine is only for you. Do not share this medicine with others. What if I miss a dose? It is important not to miss your dose. Call your doctor or health care professional if you are unable to keep an appointment. What may interact with this medicine? -cyclosporine -diazepam -ketoconazole -medicines to increase blood counts like filgrastim, pegfilgrastim, sargramostim -other chemotherapy drugs like cisplatin, doxorubicin, epirubicin, etoposide, teniposide, vincristine -quinidine -testosterone -vaccines -verapamil Talk to your doctor or health care professional  before taking any of these medicines: -acetaminophen -aspirin -ibuprofen -ketoprofen -naproxen This list may not describe all possible interactions. Give your health care provider a list of all the medicines, herbs, non-prescription drugs, or dietary supplements you use. Also tell them if you smoke, drink alcohol, or use illegal drugs. Some items may interact with your medicine. What should I watch for while using this medicine? Your condition will be monitored carefully while you are receiving this medicine. You will need important blood work done while you are taking this medicine. This medicine can cause serious allergic reactions. If you experience allergic reactions like skin rash, itching or hives, swelling of the face, lips, or tongue, tell your doctor or health care professional right away. In some cases, you may be given additional medicines to help with side effects. Follow all directions for their use. This drug may make you feel generally unwell. This is not uncommon, as chemotherapy can affect healthy cells as well as cancer cells. Report any side effects. Continue your course of treatment even though you feel ill unless your doctor tells you to stop. Call your doctor or health care professional for advice if you get a fever, chills or sore throat, or other symptoms of a cold or flu. Do not treat yourself. This drug decreases your body's ability to fight infections. Try to avoid being around people who are sick. This medicine may increase your risk to bruise or bleed. Call your doctor or health care professional if you notice any unusual bleeding. Be careful brushing and flossing your teeth or using a toothpick because you may get an infection or bleed more easily. If you have any dental work done, tell your dentist you are receiving this medicine. Avoid taking products that  contain aspirin, acetaminophen, ibuprofen, naproxen, or ketoprofen unless instructed by your doctor. These  medicines may hide a fever. Do not become pregnant while taking this medicine. Women should inform their doctor if they wish to become pregnant or think they might be pregnant. There is a potential for serious side effects to an unborn child. Talk to your health care professional or pharmacist for more information. Do not breast-feed an infant while taking this medicine. Men are advised not to father a child while receiving this medicine. What side effects may I notice from receiving this medicine? Side effects that you should report to your doctor or health care professional as soon as possible: -allergic reactions like skin rash, itching or hives, swelling of the face, lips, or tongue -low blood counts - This drug may decrease the number of white blood cells, red blood cells and platelets. You may be at increased risk for infections and bleeding. -signs of infection - fever or chills, cough, sore throat, pain or difficulty passing urine -signs of decreased platelets or bleeding - bruising, pinpoint red spots on the skin, black, tarry stools, nosebleeds -signs of decreased red blood cells - unusually weak or tired, fainting spells, lightheadedness -breathing problems -changes in vision -chest pain -high or low blood pressure -mouth sores -nausea and vomiting -pain, swelling, redness or irritation at the injection site -pain, tingling, numbness in the hands or feet -slow or irregular heartbeat -swelling of the ankle, feet, hands Side effects that usually do not require medical attention (report to your doctor or health care professional if they continue or are bothersome): -aches, pains -changes in the color of fingernails -diarrhea -hair loss -loss of appetite This list may not describe all possible side effects. Call your doctor for medical advice about side effects. You may report side effects to FDA at 1-800-FDA-1088. Where should I keep my medicine? This drug is given in a hospital  or clinic and will not be stored at home. NOTE: This sheet is a summary. It may not cover all possible information. If you have questions about this medicine, talk to your doctor, pharmacist, or health care provider.  2017 Elsevier/Gold Standard (2014-11-26 10:05:20) Bevacizumab injection What is this medicine? BEVACIZUMAB (be va SIZ yoo mab) is a monoclonal antibody. It is used to treat cervical cancer, colorectal cancer, glioblastoma multiforme, non-small cell lung cancer (NSCLC), ovarian cancer, and renal cell cancer. This medicine may be used for other purposes; ask your health care provider or pharmacist if you have questions. COMMON BRAND NAME(S): Avastin What should I tell my health care provider before I take this medicine? They need to know if you have any of these conditions: -blood clots -heart disease, including heart failure, heart attack, or chest pain (angina) -high blood pressure -infection (especially a virus infection such as chickenpox, cold sores, or herpes) -kidney disease -lung disease -prior chemotherapy with doxorubicin, daunorubicin, epirubicin, or other anthracycline type chemotherapy agents -recent or ongoing radiation therapy -recent surgery -stroke -an unusual or allergic reaction to bevacizumab, hamster proteins, mouse proteins, other medicines, foods, dyes, or preservatives -pregnant or trying to get pregnant -breast-feeding How should I use this medicine? This medicine is for infusion into a vein. It is given by a health care professional in a hospital or clinic setting. Talk to your pediatrician regarding the use of this medicine in children. Special care may be needed. Overdosage: If you think you have taken too much of this medicine contact a poison control center or emergency room at once.  NOTE: This medicine is only for you. Do not share this medicine with others. What if I miss a dose? It is important not to miss your dose. Call your doctor or  health care professional if you are unable to keep an appointment. What may interact with this medicine? Interactions are not expected. This list may not describe all possible interactions. Give your health care provider a list of all the medicines, herbs, non-prescription drugs, or dietary supplements you use. Also tell them if you smoke, drink alcohol, or use illegal drugs. Some items may interact with your medicine. What should I watch for while using this medicine? Your condition will be monitored carefully while you are receiving this medicine. You will need important blood work and urine testing done while you are taking this medicine. During your treatment, let your health care professional know if you have any unusual symptoms, such as difficulty breathing. This medicine may rarely cause 'gastrointestinal perforation' (holes in the stomach, intestines or colon), a serious side effect requiring surgery to repair. This medicine should be started at least 28 days following major surgery and the site of the surgery should be totally healed. Check with your doctor before scheduling dental work or surgery while you are receiving this treatment. Talk to your doctor if you have recently had surgery or if you have a wound that has not healed. Do not become pregnant while taking this medicine or for 6 months after stopping it. Women should inform their doctor if they wish to become pregnant or think they might be pregnant. There is a potential for serious side effects to an unborn child. Talk to your health care professional or pharmacist for more information. Do not breast-feed an infant while taking this medicine. This medicine has caused ovarian failure in some women. This medicine may interfere with the ability to have a child. You should talk to your doctor or health care professional if you are concerned about your fertility. What side effects may I notice from receiving this medicine? Side effects  that you should report to your doctor or health care professional as soon as possible: -allergic reactions like skin rash, itching or hives, swelling of the face, lips, or tongue -breathing problems -changes in vision -chest pain -confusion -jaw pain, especially after dental work -mouth sores -seizures -severe abdominal pain -severe headache -signs of decreased platelets or bleeding - bruising, pinpoint red spots on the skin, black, tarry stools, nosebleeds, blood in the urine -signs of infection - fever or chills, cough, sore throat, pain or trouble passing urine -sudden numbness or weakness of the face, arm or leg -swelling of legs or ankles -symptoms of a stroke: change in mental awareness, inability to talk or move one side of the body (especially in patients with lung cancer) -trouble passing urine or change in the amount of urine -trouble speaking or understanding -trouble walking, dizziness, loss of balance or coordination Side effects that usually do not require medical attention (report to your doctor or health care professional if they continue or are bothersome): -constipation -diarrhea -dry skin -headache -loss of appetite -nausea, vomiting This list may not describe all possible side effects. Call your doctor for medical advice about side effects. You may report side effects to FDA at 1-800-FDA-1088. Where should I keep my medicine? This drug is given in a hospital or clinic and will not be stored at home. NOTE: This sheet is a summary. It may not cover all possible information. If you have questions about  this medicine, talk to your doctor, pharmacist, or health care provider.  2017 Elsevier/Gold Standard (2015-01-16 15:28:53)

## 2016-03-03 ENCOUNTER — Other Ambulatory Visit: Payer: PPO

## 2016-03-03 ENCOUNTER — Ambulatory Visit: Payer: PPO | Admitting: Hematology & Oncology

## 2016-03-03 ENCOUNTER — Ambulatory Visit: Payer: PPO

## 2016-03-07 ENCOUNTER — Other Ambulatory Visit: Payer: Self-pay | Admitting: *Deleted

## 2016-03-07 DIAGNOSIS — M436 Torticollis: Secondary | ICD-10-CM

## 2016-03-07 DIAGNOSIS — C7951 Secondary malignant neoplasm of bone: Secondary | ICD-10-CM

## 2016-03-07 DIAGNOSIS — C3491 Malignant neoplasm of unspecified part of right bronchus or lung: Secondary | ICD-10-CM

## 2016-03-07 MED ORDER — CYCLOBENZAPRINE HCL 5 MG PO TABS: 5.0000 mg | ORAL_TABLET | Freq: Three times a day (TID) | ORAL | 2 refills | Status: AC | PRN

## 2016-03-11 ENCOUNTER — Encounter (HOSPITAL_COMMUNITY): Payer: PPO

## 2016-03-11 DIAGNOSIS — M47812 Spondylosis without myelopathy or radiculopathy, cervical region: Secondary | ICD-10-CM | POA: Diagnosis not present

## 2016-03-14 ENCOUNTER — Encounter (HOSPITAL_COMMUNITY)
Admission: RE | Admit: 2016-03-14 | Discharge: 2016-03-14 | Disposition: A | Discharge status: Home / Self Care | Payer: PPO | Source: Ambulatory Visit | Attending: Hematology & Oncology | Admitting: Hematology & Oncology

## 2016-03-14 DIAGNOSIS — C3490 Malignant neoplasm of unspecified part of unspecified bronchus or lung: Secondary | ICD-10-CM | POA: Diagnosis not present

## 2016-03-14 LAB — GLUCOSE, CAPILLARY: GLUCOSE-CAPILLARY: 86 mg/dL (ref 65–99)

## 2016-03-14 MED ORDER — FLUDEOXYGLUCOSE F - 18 (FDG) INJECTION
6.5600 | Freq: Once | INTRAVENOUS | Status: AC | PRN
  Administered 2016-03-14: 6.56 via INTRAVENOUS

## 2016-03-15 ENCOUNTER — Other Ambulatory Visit: Payer: Self-pay | Admitting: *Deleted

## 2016-03-15 DIAGNOSIS — C3490 Malignant neoplasm of unspecified part of unspecified bronchus or lung: Secondary | ICD-10-CM

## 2016-03-16 ENCOUNTER — Other Ambulatory Visit (HOSPITAL_BASED_OUTPATIENT_CLINIC_OR_DEPARTMENT_OTHER): Payer: PPO

## 2016-03-16 ENCOUNTER — Ambulatory Visit: Payer: PPO

## 2016-03-16 ENCOUNTER — Ambulatory Visit (HOSPITAL_BASED_OUTPATIENT_CLINIC_OR_DEPARTMENT_OTHER): Payer: PPO

## 2016-03-16 VITALS — BP 114/85

## 2016-03-16 DIAGNOSIS — C7951 Secondary malignant neoplasm of bone: Secondary | ICD-10-CM

## 2016-03-16 DIAGNOSIS — D508 Other iron deficiency anemias: Secondary | ICD-10-CM | POA: Diagnosis not present

## 2016-03-16 DIAGNOSIS — Z5111 Encounter for antineoplastic chemotherapy: Secondary | ICD-10-CM | POA: Diagnosis not present

## 2016-03-16 DIAGNOSIS — C3490 Malignant neoplasm of unspecified part of unspecified bronchus or lung: Secondary | ICD-10-CM

## 2016-03-16 DIAGNOSIS — C342 Malignant neoplasm of middle lobe, bronchus or lung: Secondary | ICD-10-CM

## 2016-03-16 DIAGNOSIS — J910 Malignant pleural effusion: Secondary | ICD-10-CM

## 2016-03-16 DIAGNOSIS — Z5112 Encounter for antineoplastic immunotherapy: Secondary | ICD-10-CM

## 2016-03-16 DIAGNOSIS — C3492 Malignant neoplasm of unspecified part of left bronchus or lung: Secondary | ICD-10-CM

## 2016-03-16 DIAGNOSIS — C3491 Malignant neoplasm of unspecified part of right bronchus or lung: Secondary | ICD-10-CM

## 2016-03-16 LAB — CMP (CANCER CENTER ONLY)
ALK PHOS: 85 U/L — AB (ref 26–84)
ALT: 17 U/L (ref 10–47)
AST: 22 U/L (ref 11–38)
Albumin: 3.5 g/dL (ref 3.3–5.5)
BILIRUBIN TOTAL: 0.5 mg/dL (ref 0.20–1.60)
BUN: 12 mg/dL (ref 7–22)
CO2: 25 mEq/L (ref 18–33)
Calcium: 9.2 mg/dL (ref 8.0–10.3)
Chloride: 104 mEq/L (ref 98–108)
Creat: 0.6 mg/dl (ref 0.6–1.2)
GLUCOSE: 106 mg/dL (ref 73–118)
Potassium: 3.6 mEq/L (ref 3.3–4.7)
Sodium: 141 mEq/L (ref 128–145)
TOTAL PROTEIN: 6.7 g/dL (ref 6.4–8.1)

## 2016-03-16 LAB — CBC WITH DIFFERENTIAL (CANCER CENTER ONLY)
BASO#: 0.1 10*3/uL (ref 0.0–0.2)
BASO%: 1.1 % (ref 0.0–2.0)
EOS%: 2.5 % (ref 0.0–7.0)
Eosinophils Absolute: 0.1 10*3/uL (ref 0.0–0.5)
HEMATOCRIT: 34.9 % (ref 34.8–46.6)
HGB: 11.4 g/dL — ABNORMAL LOW (ref 11.6–15.9)
LYMPH#: 1.8 10*3/uL (ref 0.9–3.3)
LYMPH%: 32.7 % (ref 14.0–48.0)
MCH: 27.9 pg (ref 26.0–34.0)
MCHC: 32.7 g/dL (ref 32.0–36.0)
MCV: 85 fL (ref 81–101)
MONO#: 0.7 10*3/uL (ref 0.1–0.9)
MONO%: 12.3 % (ref 0.0–13.0)
NEUT#: 2.8 10*3/uL (ref 1.5–6.5)
NEUT%: 51.4 % (ref 39.6–80.0)
PLATELETS: 267 10*3/uL (ref 145–400)
RBC: 4.09 10*6/uL (ref 3.70–5.32)
RDW: 19.4 % — ABNORMAL HIGH (ref 11.1–15.7)
WBC: 5.5 10*3/uL (ref 3.9–10.0)

## 2016-03-16 LAB — LACTATE DEHYDROGENASE: LDH: 186 U/L (ref 125–245)

## 2016-03-16 MED ORDER — SODIUM CHLORIDE 0.9 % IV SOLN
Freq: Once | INTRAVENOUS | Status: AC
  Administered 2016-03-16: 12:00:00 via INTRAVENOUS

## 2016-03-16 MED ORDER — PROCHLORPERAZINE MALEATE 10 MG PO TABS
10.0000 mg | ORAL_TABLET | Freq: Once | ORAL | Status: AC
Start: 2016-03-16 — End: 2016-03-16
  Administered 2016-03-16: 10 mg via ORAL

## 2016-03-16 MED ORDER — SODIUM CHLORIDE 0.9 % IV SOLN: Freq: Once | INTRAVENOUS | Status: DC

## 2016-03-16 MED ORDER — BEVACIZUMAB CHEMO INJECTION 400 MG/16ML
600.0000 mg | Freq: Once | INTRAVENOUS | Status: AC
  Administered 2016-03-16: 600 mg via INTRAVENOUS
  Filled 2016-03-16: qty 16

## 2016-03-16 MED ORDER — DENOSUMAB 120 MG/1.7ML ~~LOC~~ SOLN
120.0000 mg | Freq: Once | SUBCUTANEOUS | Status: AC
  Administered 2016-03-16: 120 mg via SUBCUTANEOUS
  Filled 2016-03-16: qty 1.7

## 2016-03-16 MED ORDER — PACLITAXEL PROTEIN-BOUND CHEMO INJECTION 100 MG
81.0000 mg/m2 | Freq: Once | INTRAVENOUS | Status: AC
  Administered 2016-03-16: 125 mg via INTRAVENOUS
  Filled 2016-03-16: qty 25

## 2016-03-16 MED ORDER — HEPARIN SOD (PORK) LOCK FLUSH 100 UNIT/ML IV SOLN
500.0000 [IU] | Freq: Once | INTRAVENOUS | Status: AC | PRN
  Administered 2016-03-16: 500 [IU]
  Filled 2016-03-16: qty 5

## 2016-03-16 MED ORDER — ALTEPLASE 2 MG IJ SOLR
2.0000 mg | Freq: Once | INTRAMUSCULAR | Status: DC | PRN
  Filled 2016-03-16: qty 2

## 2016-03-16 MED ORDER — SODIUM CHLORIDE 0.9% FLUSH
3.0000 mL | INTRAVENOUS | Status: DC | PRN
  Filled 2016-03-16: qty 10

## 2016-03-16 MED ORDER — PROCHLORPERAZINE MALEATE 10 MG PO TABS
ORAL_TABLET | ORAL | Status: AC
  Filled 2016-03-16: qty 1

## 2016-03-16 MED ORDER — SODIUM CHLORIDE 0.9% FLUSH
10.0000 mL | INTRAVENOUS | Status: DC | PRN
  Administered 2016-03-16: 10 mL
  Filled 2016-03-16: qty 10

## 2016-03-16 MED ORDER — HEPARIN SOD (PORK) LOCK FLUSH 100 UNIT/ML IV SOLN
250.0000 [IU] | Freq: Once | INTRAVENOUS | Status: DC | PRN
  Filled 2016-03-16: qty 5

## 2016-03-16 NOTE — Patient Instructions (Signed)

## 2016-03-16 NOTE — Addendum Note (Signed)
Addended by: Burney Gauze R on: 03/16/2016 12:24 PM   Modules accepted: Orders

## 2016-03-16 NOTE — Patient Instructions (Signed)
Nanoparticle Albumin-Bound Paclitaxel injection What is this medicine? NANOPARTICLE ALBUMIN-BOUND PACLITAXEL (Na no PAHR ti kuhl al BYOO muhn-bound PAK li TAX el) is a chemotherapy drug. It targets fast dividing cells, like cancer cells, and causes these cells to die. This medicine is used to treat advanced breast cancer and advanced lung cancer. This medicine may be used for other purposes; ask your health care provider or pharmacist if you have questions. COMMON BRAND NAME(S): Abraxane What should I tell my health care provider before I take this medicine? They need to know if you have any of these conditions: -kidney disease -liver disease -low blood counts, like low platelets, red blood cells, or white blood cells -recent or ongoing radiation therapy -an unusual or allergic reaction to paclitaxel, albumin, other chemotherapy, other medicines, foods, dyes, or preservatives -pregnant or trying to get pregnant -breast-feeding How should I use this medicine? This drug is given as an infusion into a vein. It is administered in a hospital or clinic by a specially trained health care professional. Talk to your pediatrician regarding the use of this medicine in children. Special care may be needed. Overdosage: If you think you have taken too much of this medicine contact a poison control center or emergency room at once. NOTE: This medicine is only for you. Do not share this medicine with others. What if I miss a dose? It is important not to miss your dose. Call your doctor or health care professional if you are unable to keep an appointment. What may interact with this medicine? -cyclosporine -diazepam -ketoconazole -medicines to increase blood counts like filgrastim, pegfilgrastim, sargramostim -other chemotherapy drugs like cisplatin, doxorubicin, epirubicin, etoposide, teniposide, vincristine -quinidine -testosterone -vaccines -verapamil Talk to your doctor or health care professional  before taking any of these medicines: -acetaminophen -aspirin -ibuprofen -ketoprofen -naproxen This list may not describe all possible interactions. Give your health care provider a list of all the medicines, herbs, non-prescription drugs, or dietary supplements you use. Also tell them if you smoke, drink alcohol, or use illegal drugs. Some items may interact with your medicine. What should I watch for while using this medicine? Your condition will be monitored carefully while you are receiving this medicine. You will need important blood work done while you are taking this medicine. This medicine can cause serious allergic reactions. If you experience allergic reactions like skin rash, itching or hives, swelling of the face, lips, or tongue, tell your doctor or health care professional right away. In some cases, you may be given additional medicines to help with side effects. Follow all directions for their use. This drug may make you feel generally unwell. This is not uncommon, as chemotherapy can affect healthy cells as well as cancer cells. Report any side effects. Continue your course of treatment even though you feel ill unless your doctor tells you to stop. Call your doctor or health care professional for advice if you get a fever, chills or sore throat, or other symptoms of a cold or flu. Do not treat yourself. This drug decreases your body's ability to fight infections. Try to avoid being around people who are sick. This medicine may increase your risk to bruise or bleed. Call your doctor or health care professional if you notice any unusual bleeding. Be careful brushing and flossing your teeth or using a toothpick because you may get an infection or bleed more easily. If you have any dental work done, tell your dentist you are receiving this medicine. Avoid taking products that  contain aspirin, acetaminophen, ibuprofen, naproxen, or ketoprofen unless instructed by your doctor. These  medicines may hide a fever. Do not become pregnant while taking this medicine. Women should inform their doctor if they wish to become pregnant or think they might be pregnant. There is a potential for serious side effects to an unborn child. Talk to your health care professional or pharmacist for more information. Do not breast-feed an infant while taking this medicine. Men are advised not to father a child while receiving this medicine. What side effects may I notice from receiving this medicine? Side effects that you should report to your doctor or health care professional as soon as possible: -allergic reactions like skin rash, itching or hives, swelling of the face, lips, or tongue -low blood counts - This drug may decrease the number of white blood cells, red blood cells and platelets. You may be at increased risk for infections and bleeding. -signs of infection - fever or chills, cough, sore throat, pain or difficulty passing urine -signs of decreased platelets or bleeding - bruising, pinpoint red spots on the skin, black, tarry stools, nosebleeds -signs of decreased red blood cells - unusually weak or tired, fainting spells, lightheadedness -breathing problems -changes in vision -chest pain -high or low blood pressure -mouth sores -nausea and vomiting -pain, swelling, redness or irritation at the injection site -pain, tingling, numbness in the hands or feet -slow or irregular heartbeat -swelling of the ankle, feet, hands Side effects that usually do not require medical attention (report to your doctor or health care professional if they continue or are bothersome): -aches, pains -changes in the color of fingernails -diarrhea -hair loss -loss of appetite This list may not describe all possible side effects. Call your doctor for medical advice about side effects. You may report side effects to FDA at 1-800-FDA-1088. Where should I keep my medicine? This drug is given in a hospital  or clinic and will not be stored at home. NOTE: This sheet is a summary. It may not cover all possible information. If you have questions about this medicine, talk to your doctor, pharmacist, or health care provider.  2017 Elsevier/Gold Standard (2014-11-26 10:05:20) Bevacizumab injection What is this medicine? BEVACIZUMAB (be va SIZ yoo mab) is a monoclonal antibody. It is used to treat cervical cancer, colorectal cancer, glioblastoma multiforme, non-small cell lung cancer (NSCLC), ovarian cancer, and renal cell cancer. This medicine may be used for other purposes; ask your health care provider or pharmacist if you have questions. COMMON BRAND NAME(S): Avastin What should I tell my health care provider before I take this medicine? They need to know if you have any of these conditions: -blood clots -heart disease, including heart failure, heart attack, or chest pain (angina) -high blood pressure -infection (especially a virus infection such as chickenpox, cold sores, or herpes) -kidney disease -lung disease -prior chemotherapy with doxorubicin, daunorubicin, epirubicin, or other anthracycline type chemotherapy agents -recent or ongoing radiation therapy -recent surgery -stroke -an unusual or allergic reaction to bevacizumab, hamster proteins, mouse proteins, other medicines, foods, dyes, or preservatives -pregnant or trying to get pregnant -breast-feeding How should I use this medicine? This medicine is for infusion into a vein. It is given by a health care professional in a hospital or clinic setting. Talk to your pediatrician regarding the use of this medicine in children. Special care may be needed. Overdosage: If you think you have taken too much of this medicine contact a poison control center or emergency room at once.  NOTE: This medicine is only for you. Do not share this medicine with others. What if I miss a dose? It is important not to miss your dose. Call your doctor or  health care professional if you are unable to keep an appointment. What may interact with this medicine? Interactions are not expected. This list may not describe all possible interactions. Give your health care provider a list of all the medicines, herbs, non-prescription drugs, or dietary supplements you use. Also tell them if you smoke, drink alcohol, or use illegal drugs. Some items may interact with your medicine. What should I watch for while using this medicine? Your condition will be monitored carefully while you are receiving this medicine. You will need important blood work and urine testing done while you are taking this medicine. During your treatment, let your health care professional know if you have any unusual symptoms, such as difficulty breathing. This medicine may rarely cause 'gastrointestinal perforation' (holes in the stomach, intestines or colon), a serious side effect requiring surgery to repair. This medicine should be started at least 28 days following major surgery and the site of the surgery should be totally healed. Check with your doctor before scheduling dental work or surgery while you are receiving this treatment. Talk to your doctor if you have recently had surgery or if you have a wound that has not healed. Do not become pregnant while taking this medicine or for 6 months after stopping it. Women should inform their doctor if they wish to become pregnant or think they might be pregnant. There is a potential for serious side effects to an unborn child. Talk to your health care professional or pharmacist for more information. Do not breast-feed an infant while taking this medicine. This medicine has caused ovarian failure in some women. This medicine may interfere with the ability to have a child. You should talk to your doctor or health care professional if you are concerned about your fertility. What side effects may I notice from receiving this medicine? Side effects  that you should report to your doctor or health care professional as soon as possible: -allergic reactions like skin rash, itching or hives, swelling of the face, lips, or tongue -breathing problems -changes in vision -chest pain -confusion -jaw pain, especially after dental work -mouth sores -seizures -severe abdominal pain -severe headache -signs of decreased platelets or bleeding - bruising, pinpoint red spots on the skin, black, tarry stools, nosebleeds, blood in the urine -signs of infection - fever or chills, cough, sore throat, pain or trouble passing urine -sudden numbness or weakness of the face, arm or leg -swelling of legs or ankles -symptoms of a stroke: change in mental awareness, inability to talk or move one side of the body (especially in patients with lung cancer) -trouble passing urine or change in the amount of urine -trouble speaking or understanding -trouble walking, dizziness, loss of balance or coordination Side effects that usually do not require medical attention (report to your doctor or health care professional if they continue or are bothersome): -constipation -diarrhea -dry skin -headache -loss of appetite -nausea, vomiting This list may not describe all possible side effects. Call your doctor for medical advice about side effects. You may report side effects to FDA at 1-800-FDA-1088. Where should I keep my medicine? This drug is given in a hospital or clinic and will not be stored at home. NOTE: This sheet is a summary. It may not cover all possible information. If you have questions about  this medicine, talk to your doctor, pharmacist, or health care provider.  2017 Elsevier/Gold Standard (2015-01-16 15:28:53) Denosumab injection What is this medicine? DENOSUMAB (den oh sue mab) slows bone breakdown. Prolia is used to treat osteoporosis in women after menopause and in men. Delton See is used to prevent bone fractures and other bone problems caused by cancer  bone metastases. Delton See is also used to treat giant cell tumor of the bone. COMMON BRAND NAME(S): Prolia, XGEVA What should I tell my health care provider before I take this medicine? They need to know if you have any of these conditions: -dental disease -eczema -infection or history of infections -kidney disease or on dialysis -low blood calcium or vitamin D -malabsorption syndrome -scheduled to have surgery or tooth extraction -taking medicine that contains denosumab -thyroid or parathyroid disease -an unusual reaction to denosumab, other medicines, foods, dyes, or preservatives -pregnant or trying to get pregnant -breast-feeding How should I use this medicine? This medicine is for injection under the skin. It is given by a health care professional in a hospital or clinic setting. If you are getting Prolia, a special MedGuide will be given to you by the pharmacist with each prescription and refill. Be sure to read this information carefully each time. For Prolia, talk to your pediatrician regarding the use of this medicine in children. Special care may be needed. For Delton See, talk to your pediatrician regarding the use of this medicine in children. While this drug may be prescribed for children as young as 13 years for selected conditions, precautions do apply. What if I miss a dose? It is important not to miss your dose. Call your doctor or health care professional if you are unable to keep an appointment. What may interact with this medicine? Do not take this medicine with any of the following medications: -other medicines containing denosumab This medicine may also interact with the following medications: -medicines that suppress the immune system -medicines that treat cancer -steroid medicines like prednisone or cortisone What should I watch for while using this medicine? Visit your doctor or health care professional for regular checks on your progress. Your doctor or health care  professional may order blood tests and other tests to see how you are doing. Call your doctor or health care professional if you get a cold or other infection while receiving this medicine. Do not treat yourself. This medicine may decrease your body's ability to fight infection. You should make sure you get enough calcium and vitamin D while you are taking this medicine, unless your doctor tells you not to. Discuss the foods you eat and the vitamins you take with your health care professional. See your dentist regularly. Brush and floss your teeth as directed. Before you have any dental work done, tell your dentist you are receiving this medicine. Do not become pregnant while taking this medicine or for 5 months after stopping it. Women should inform their doctor if they wish to become pregnant or think they might be pregnant. There is a potential for serious side effects to an unborn child. Talk to your health care professional or pharmacist for more information. What side effects may I notice from receiving this medicine? Side effects that you should report to your doctor or health care professional as soon as possible: -allergic reactions like skin rash, itching or hives, swelling of the face, lips, or tongue -breathing problems -chest pain -fast, irregular heartbeat -feeling faint or lightheaded, falls -fever, chills, or any other sign of infection -muscle  spasms, tightening, or twitches -numbness or tingling -skin blisters or bumps, or is dry, peels, or red -slow healing or unexplained pain in the mouth or jaw -unusual bleeding or bruising Side effects that usually do not require medical attention (report to your doctor or health care professional if they continue or are bothersome): -muscle pain -stomach upset, gas Where should I keep my medicine? This medicine is only given in a clinic, doctor's office, or other health care setting and will not be stored at home.  2017 Elsevier/Gold  Standard (2015-02-26 10:06:55)

## 2016-03-17 LAB — IRON AND TIBC
%SAT: 25 % (ref 21–57)
Iron: 68 ug/dL (ref 41–142)
TIBC: 269 ug/dL (ref 236–444)
UIBC: 201 ug/dL (ref 120–384)

## 2016-03-17 LAB — FERRITIN: Ferritin: 144 ng/ml (ref 9–269)

## 2016-03-30 ENCOUNTER — Ambulatory Visit: Payer: PPO

## 2016-03-30 ENCOUNTER — Ambulatory Visit: Payer: PPO | Admitting: Hematology & Oncology

## 2016-03-30 ENCOUNTER — Other Ambulatory Visit: Payer: PPO

## 2016-03-31 ENCOUNTER — Ambulatory Visit: Payer: PPO

## 2016-03-31 ENCOUNTER — Other Ambulatory Visit (HOSPITAL_BASED_OUTPATIENT_CLINIC_OR_DEPARTMENT_OTHER): Payer: PPO

## 2016-03-31 ENCOUNTER — Ambulatory Visit (HOSPITAL_BASED_OUTPATIENT_CLINIC_OR_DEPARTMENT_OTHER): Payer: PPO

## 2016-03-31 ENCOUNTER — Ambulatory Visit (HOSPITAL_BASED_OUTPATIENT_CLINIC_OR_DEPARTMENT_OTHER): Payer: PPO | Admitting: Hematology & Oncology

## 2016-03-31 VITALS — BP 115/72 | HR 93 | Temp 97.8°F | Resp 16 | Wt 137.0 lb

## 2016-03-31 DIAGNOSIS — Z5111 Encounter for antineoplastic chemotherapy: Secondary | ICD-10-CM

## 2016-03-31 DIAGNOSIS — C3492 Malignant neoplasm of unspecified part of left bronchus or lung: Secondary | ICD-10-CM

## 2016-03-31 DIAGNOSIS — C7951 Secondary malignant neoplasm of bone: Secondary | ICD-10-CM

## 2016-03-31 DIAGNOSIS — C3490 Malignant neoplasm of unspecified part of unspecified bronchus or lung: Secondary | ICD-10-CM | POA: Diagnosis not present

## 2016-03-31 DIAGNOSIS — J910 Malignant pleural effusion: Secondary | ICD-10-CM

## 2016-03-31 DIAGNOSIS — Z5112 Encounter for antineoplastic immunotherapy: Secondary | ICD-10-CM

## 2016-03-31 DIAGNOSIS — C779 Secondary and unspecified malignant neoplasm of lymph node, unspecified: Secondary | ICD-10-CM

## 2016-03-31 DIAGNOSIS — C3491 Malignant neoplasm of unspecified part of right bronchus or lung: Secondary | ICD-10-CM

## 2016-03-31 LAB — CBC WITH DIFFERENTIAL (CANCER CENTER ONLY)
BASO#: 0.1 10*3/uL (ref 0.0–0.2)
BASO%: 2.1 % — AB (ref 0.0–2.0)
EOS%: 1.8 % (ref 0.0–7.0)
Eosinophils Absolute: 0.1 10*3/uL (ref 0.0–0.5)
HEMATOCRIT: 34.6 % — AB (ref 34.8–46.6)
HGB: 11.2 g/dL — ABNORMAL LOW (ref 11.6–15.9)
LYMPH#: 1.9 10*3/uL (ref 0.9–3.3)
LYMPH%: 29.7 % (ref 14.0–48.0)
MCH: 28.1 pg (ref 26.0–34.0)
MCHC: 32.4 g/dL (ref 32.0–36.0)
MCV: 87 fL (ref 81–101)
MONO#: 0.7 10*3/uL (ref 0.1–0.9)
MONO%: 10.8 % (ref 0.0–13.0)
NEUT#: 3.5 10*3/uL (ref 1.5–6.5)
NEUT%: 55.6 % (ref 39.6–80.0)
PLATELETS: 232 10*3/uL (ref 145–400)
RBC: 3.98 10*6/uL (ref 3.70–5.32)
RDW: 19.1 % — AB (ref 11.1–15.7)
WBC: 6.2 10*3/uL (ref 3.9–10.0)

## 2016-03-31 LAB — CMP (CANCER CENTER ONLY)
ALT(SGPT): 20 U/L (ref 10–47)
AST: 24 U/L (ref 11–38)
Albumin: 3.6 g/dL (ref 3.3–5.5)
Alkaline Phosphatase: 85 U/L — ABNORMAL HIGH (ref 26–84)
BILIRUBIN TOTAL: 0.5 mg/dL (ref 0.20–1.60)
BUN: 11 mg/dL (ref 7–22)
CALCIUM: 8.9 mg/dL (ref 8.0–10.3)
CO2: 25 meq/L (ref 18–33)
Chloride: 106 mEq/L (ref 98–108)
Creat: 0.7 mg/dl (ref 0.6–1.2)
GLUCOSE: 123 mg/dL — AB (ref 73–118)
POTASSIUM: 3.9 meq/L (ref 3.3–4.7)
Sodium: 142 mEq/L (ref 128–145)
Total Protein: 6.9 g/dL (ref 6.4–8.1)

## 2016-03-31 LAB — UA PROTEIN, DIPSTICK - CHCC SATELLITE: Protein, Urine: <30 mg/dL

## 2016-03-31 MED ORDER — HEPARIN SOD (PORK) LOCK FLUSH 100 UNIT/ML IV SOLN
250.0000 [IU] | Freq: Once | INTRAVENOUS | Status: DC | PRN
  Filled 2016-03-31: qty 5

## 2016-03-31 MED ORDER — PACLITAXEL PROTEIN-BOUND CHEMO INJECTION 100 MG
81.0000 mg/m2 | Freq: Once | INTRAVENOUS | Status: AC
  Administered 2016-03-31: 125 mg via INTRAVENOUS
  Filled 2016-03-31: qty 25

## 2016-03-31 MED ORDER — SODIUM CHLORIDE 0.9 % IV SOLN
10.5000 mg/kg | Freq: Once | INTRAVENOUS | Status: AC
  Administered 2016-03-31: 600 mg via INTRAVENOUS
  Filled 2016-03-31: qty 16

## 2016-03-31 MED ORDER — PROCHLORPERAZINE MALEATE 10 MG PO TABS
10.0000 mg | ORAL_TABLET | Freq: Once | ORAL | Status: AC
  Administered 2016-03-31: 10 mg via ORAL

## 2016-03-31 MED ORDER — ALTEPLASE 2 MG IJ SOLR
2.0000 mg | Freq: Once | INTRAMUSCULAR | Status: DC | PRN
  Filled 2016-03-31: qty 2

## 2016-03-31 MED ORDER — SODIUM CHLORIDE 0.9 % IV SOLN
Freq: Once | INTRAVENOUS | Status: AC
  Administered 2016-03-31: 14:00:00 via INTRAVENOUS

## 2016-03-31 MED ORDER — SODIUM CHLORIDE 0.9% FLUSH
10.0000 mL | INTRAVENOUS | Status: DC | PRN
  Administered 2016-03-31: 10 mL
  Filled 2016-03-31: qty 10

## 2016-03-31 MED ORDER — SODIUM CHLORIDE 0.9% FLUSH
3.0000 mL | INTRAVENOUS | Status: DC | PRN
  Filled 2016-03-31: qty 10

## 2016-03-31 MED ORDER — HEPARIN SOD (PORK) LOCK FLUSH 100 UNIT/ML IV SOLN
500.0000 [IU] | Freq: Once | INTRAVENOUS | Status: AC | PRN
  Administered 2016-03-31: 500 [IU]
  Filled 2016-03-31: qty 5

## 2016-03-31 MED ORDER — PROCHLORPERAZINE MALEATE 10 MG PO TABS
ORAL_TABLET | ORAL | Status: AC
  Filled 2016-03-31: qty 1

## 2016-03-31 NOTE — Patient Instructions (Signed)

## 2016-03-31 NOTE — Patient Instructions (Signed)
Nanoparticle Albumin-Bound Paclitaxel injection What is this medicine? NANOPARTICLE ALBUMIN-BOUND PACLITAXEL (Na no PAHR ti kuhl al BYOO muhn-bound PAK li TAX el) is a chemotherapy drug. It targets fast dividing cells, like cancer cells, and causes these cells to die. This medicine is used to treat advanced breast cancer and advanced lung cancer. This medicine may be used for other purposes; ask your health care provider or pharmacist if you have questions. COMMON BRAND NAME(S): Abraxane What should I tell my health care provider before I take this medicine? They need to know if you have any of these conditions: -kidney disease -liver disease -low blood counts, like low platelets, red blood cells, or white blood cells -recent or ongoing radiation therapy -an unusual or allergic reaction to paclitaxel, albumin, other chemotherapy, other medicines, foods, dyes, or preservatives -pregnant or trying to get pregnant -breast-feeding How should I use this medicine? This drug is given as an infusion into a vein. It is administered in a hospital or clinic by a specially trained health care professional. Talk to your pediatrician regarding the use of this medicine in children. Special care may be needed. Overdosage: If you think you have taken too much of this medicine contact a poison control center or emergency room at once. NOTE: This medicine is only for you. Do not share this medicine with others. What if I miss a dose? It is important not to miss your dose. Call your doctor or health care professional if you are unable to keep an appointment. What may interact with this medicine? -cyclosporine -diazepam -ketoconazole -medicines to increase blood counts like filgrastim, pegfilgrastim, sargramostim -other chemotherapy drugs like cisplatin, doxorubicin, epirubicin, etoposide, teniposide, vincristine -quinidine -testosterone -vaccines -verapamil Talk to your doctor or health care professional  before taking any of these medicines: -acetaminophen -aspirin -ibuprofen -ketoprofen -naproxen This list may not describe all possible interactions. Give your health care provider a list of all the medicines, herbs, non-prescription drugs, or dietary supplements you use. Also tell them if you smoke, drink alcohol, or use illegal drugs. Some items may interact with your medicine. What should I watch for while using this medicine? Your condition will be monitored carefully while you are receiving this medicine. You will need important blood work done while you are taking this medicine. This medicine can cause serious allergic reactions. If you experience allergic reactions like skin rash, itching or hives, swelling of the face, lips, or tongue, tell your doctor or health care professional right away. In some cases, you may be given additional medicines to help with side effects. Follow all directions for their use. This drug may make you feel generally unwell. This is not uncommon, as chemotherapy can affect healthy cells as well as cancer cells. Report any side effects. Continue your course of treatment even though you feel ill unless your doctor tells you to stop. Call your doctor or health care professional for advice if you get a fever, chills or sore throat, or other symptoms of a cold or flu. Do not treat yourself. This drug decreases your body's ability to fight infections. Try to avoid being around people who are sick. This medicine may increase your risk to bruise or bleed. Call your doctor or health care professional if you notice any unusual bleeding. Be careful brushing and flossing your teeth or using a toothpick because you may get an infection or bleed more easily. If you have any dental work done, tell your dentist you are receiving this medicine. Avoid taking products that  contain aspirin, acetaminophen, ibuprofen, naproxen, or ketoprofen unless instructed by your doctor. These  medicines may hide a fever. Do not become pregnant while taking this medicine. Women should inform their doctor if they wish to become pregnant or think they might be pregnant. There is a potential for serious side effects to an unborn child. Talk to your health care professional or pharmacist for more information. Do not breast-feed an infant while taking this medicine. Men are advised not to father a child while receiving this medicine. What side effects may I notice from receiving this medicine? Side effects that you should report to your doctor or health care professional as soon as possible: -allergic reactions like skin rash, itching or hives, swelling of the face, lips, or tongue -low blood counts - This drug may decrease the number of white blood cells, red blood cells and platelets. You may be at increased risk for infections and bleeding. -signs of infection - fever or chills, cough, sore throat, pain or difficulty passing urine -signs of decreased platelets or bleeding - bruising, pinpoint red spots on the skin, black, tarry stools, nosebleeds -signs of decreased red blood cells - unusually weak or tired, fainting spells, lightheadedness -breathing problems -changes in vision -chest pain -high or low blood pressure -mouth sores -nausea and vomiting -pain, swelling, redness or irritation at the injection site -pain, tingling, numbness in the hands or feet -slow or irregular heartbeat -swelling of the ankle, feet, hands Side effects that usually do not require medical attention (report to your doctor or health care professional if they continue or are bothersome): -aches, pains -changes in the color of fingernails -diarrhea -hair loss -loss of appetite This list may not describe all possible side effects. Call your doctor for medical advice about side effects. You may report side effects to FDA at 1-800-FDA-1088. Where should I keep my medicine? This drug is given in a hospital  or clinic and will not be stored at home. NOTE: This sheet is a summary. It may not cover all possible information. If you have questions about this medicine, talk to your doctor, pharmacist, or health care provider.  2017 Elsevier/Gold Standard (2014-11-26 10:05:20) Bevacizumab injection What is this medicine? BEVACIZUMAB (be va SIZ yoo mab) is a monoclonal antibody. It is used to treat cervical cancer, colorectal cancer, glioblastoma multiforme, non-small cell lung cancer (NSCLC), ovarian cancer, and renal cell cancer. This medicine may be used for other purposes; ask your health care provider or pharmacist if you have questions. COMMON BRAND NAME(S): Avastin What should I tell my health care provider before I take this medicine? They need to know if you have any of these conditions: -blood clots -heart disease, including heart failure, heart attack, or chest pain (angina) -high blood pressure -infection (especially a virus infection such as chickenpox, cold sores, or herpes) -kidney disease -lung disease -prior chemotherapy with doxorubicin, daunorubicin, epirubicin, or other anthracycline type chemotherapy agents -recent or ongoing radiation therapy -recent surgery -stroke -an unusual or allergic reaction to bevacizumab, hamster proteins, mouse proteins, other medicines, foods, dyes, or preservatives -pregnant or trying to get pregnant -breast-feeding How should I use this medicine? This medicine is for infusion into a vein. It is given by a health care professional in a hospital or clinic setting. Talk to your pediatrician regarding the use of this medicine in children. Special care may be needed. Overdosage: If you think you have taken too much of this medicine contact a poison control center or emergency room at once.  NOTE: This medicine is only for you. Do not share this medicine with others. What if I miss a dose? It is important not to miss your dose. Call your doctor or  health care professional if you are unable to keep an appointment. What may interact with this medicine? Interactions are not expected. This list may not describe all possible interactions. Give your health care provider a list of all the medicines, herbs, non-prescription drugs, or dietary supplements you use. Also tell them if you smoke, drink alcohol, or use illegal drugs. Some items may interact with your medicine. What should I watch for while using this medicine? Your condition will be monitored carefully while you are receiving this medicine. You will need important blood work and urine testing done while you are taking this medicine. During your treatment, let your health care professional know if you have any unusual symptoms, such as difficulty breathing. This medicine may rarely cause 'gastrointestinal perforation' (holes in the stomach, intestines or colon), a serious side effect requiring surgery to repair. This medicine should be started at least 28 days following major surgery and the site of the surgery should be totally healed. Check with your doctor before scheduling dental work or surgery while you are receiving this treatment. Talk to your doctor if you have recently had surgery or if you have a wound that has not healed. Do not become pregnant while taking this medicine or for 6 months after stopping it. Women should inform their doctor if they wish to become pregnant or think they might be pregnant. There is a potential for serious side effects to an unborn child. Talk to your health care professional or pharmacist for more information. Do not breast-feed an infant while taking this medicine. This medicine has caused ovarian failure in some women. This medicine may interfere with the ability to have a child. You should talk to your doctor or health care professional if you are concerned about your fertility. What side effects may I notice from receiving this medicine? Side effects  that you should report to your doctor or health care professional as soon as possible: -allergic reactions like skin rash, itching or hives, swelling of the face, lips, or tongue -breathing problems -changes in vision -chest pain -confusion -jaw pain, especially after dental work -mouth sores -seizures -severe abdominal pain -severe headache -signs of decreased platelets or bleeding - bruising, pinpoint red spots on the skin, black, tarry stools, nosebleeds, blood in the urine -signs of infection - fever or chills, cough, sore throat, pain or trouble passing urine -sudden numbness or weakness of the face, arm or leg -swelling of legs or ankles -symptoms of a stroke: change in mental awareness, inability to talk or move one side of the body (especially in patients with lung cancer) -trouble passing urine or change in the amount of urine -trouble speaking or understanding -trouble walking, dizziness, loss of balance or coordination Side effects that usually do not require medical attention (report to your doctor or health care professional if they continue or are bothersome): -constipation -diarrhea -dry skin -headache -loss of appetite -nausea, vomiting This list may not describe all possible side effects. Call your doctor for medical advice about side effects. You may report side effects to FDA at 1-800-FDA-1088. Where should I keep my medicine? This drug is given in a hospital or clinic and will not be stored at home. NOTE: This sheet is a summary. It may not cover all possible information. If you have questions about  this medicine, talk to your doctor, pharmacist, or health care provider.  2017 Elsevier/Gold Standard (2015-01-16 15:28:53)

## 2016-03-31 NOTE — Progress Notes (Signed)
In a pool This is a  Hematology and Oncology Follow Up Visit  IVANNIA WILLHELM [id number] [birth year] 66 y.o. 03/31/2016   Principle Diagnosis:  Metastatic adenocarcinoma the lung - bone metastases, lymph node metastasis and intrapulmonary metastasis Malignant left pleural effusion  Current Therapy:  AbraxaneAvastin -s/p cycle #15  Navelbine/Avastin q 2wk dosing - s/p c#4 - progression  Tecentriq s/p cycle 4 Xgeva 120 mg subcutaneous every month Palliative radiation therapy to the neck.    Interim History:  Ms. Childers is here today for follow-up. She is complaining of pain on her right side. She's had this pain before. She's had this pain going back to when she had her lobectomy. I had to believe that this is from her lobectomy and is more neuropathic. I don't think it is related to her malignancy.  We did go ahead and get a PET scan on her. The PET scan, thankfully, showed that everything looked relatively stable. There is no new areas of disease. She had decrease in activity in some areas.  She does have some fatigue. Her appetite is doing okay. She has gained a little weight.  She's had no nausea or vomiting. His been no change in bowel or bladder habits.  She's had a little bit of leg swelling. This might be from the Lotrel for her blood pressure. Because of blood pressure is doing so well, I thought we could have her take the Lotrel every other day.   Overall, her performance status is ECOG 1.   Medications:  Allergies as of 03/31/2016      Reactions   Demerol Other (See Comments)   Syncope Passed out   Topamax Other (See Comments)   Had trouble walking   Ciprofloxacin Other (See Comments)   UNSPECIFIED REACTION    Avelox [moxifloxacin Hcl In Nacl] Other (See Comments)   Hypotension.   Mefoxin [cefoxitin Sodium In Dextrose] Rash      Medication List       Accurate as of 03/31/16  1:38 PM. Always use your most recent med list.          amLODipine-benazepril 5-10  MG capsule Commonly known as:  LOTREL Take 1 capsule by mouth daily.   budesonide-formoterol 160-4.5 MCG/ACT inhaler Commonly known as:  SYMBICORT Inhale 2 puffs into the lungs 2 (two) times daily as needed (SOB, wheezing).   cyclobenzaprine 5 MG tablet Commonly known as:  FLEXERIL Take 1 tablet (5 mg total) by mouth 3 (three) times daily as needed for muscle spasms.   diphenhydramine-acetaminophen 25-500 MG Tabs tablet Commonly known as:  TYLENOL PM Take 1 tablet by mouth at bedtime as needed (sleep).   HYDROcodone-homatropine 5-1.5 MG/5ML syrup Commonly known as:  HYCODAN Take 5 mLs by mouth every 6 (six) hours as needed for cough.   ibuprofen 200 MG tablet Commonly known as:  ADVIL,MOTRIN Take 600 mg by mouth every 6 (six) hours as needed for mild pain.   KLOR-CON M20 20 MEQ tablet Generic drug:  potassium chloride SA TAKE 1 TABLET BY MOUTH DAILY   lactulose 10 GM/15ML solution Commonly known as:  CHRONULAC Take 15 mLs (10 g total) by mouth daily. Reported on 07/22/2015   lidocaine-prilocaine cream Commonly known as:  EMLA Apply 1 application topically daily as needed (pain).   loratadine 10 MG tablet Commonly known as:  CLARITIN Take 10 mg by mouth daily as needed for allergies.   meloxicam 7.5 MG tablet Commonly known as:  MOBIC Take 7.5 mg by mouth  as needed.   omeprazole 40 MG capsule Commonly known as:  PRILOSEC Take 40 mg by mouth daily as needed (heartburn).   oxyCODONE 10 mg 12 hr tablet Commonly known as:  OXYCONTIN Take 1 tablet (10 mg total) by mouth every 12 (twelve) hours.   OxyCODONE HCl (Abuse Deter) 5 MG Taba Commonly known as:  OXAYDO Take 5 mg by mouth every 4 (four) hours as needed (pain).   polyethylene glycol powder powder Commonly known as:  GLYCOLAX/MIRALAX Take 17 g by mouth once. Dissolve 1 capful of powder into any liquid and take once daily. Can increase to 2 times daily if no effect after 3-4 days.   PROAIR RESPICLICK 161 (90  Base) MCG/ACT Aepb Generic drug:  Albuterol Sulfate Inhale 2 puffs into the lungs twice daily as needed for shortness of breath or wheezing   prochlorperazine 10 MG tablet Commonly known as:  COMPAZINE Take 10 mg by mouth every 6 (six) hours as needed for nausea or vomiting.   senna-docusate 8.6-50 MG tablet Commonly known as:  Senokot-S Take 1 tablet by mouth daily.   SUMAtriptan 100 MG tablet Commonly known as:  IMITREX Take 100 mg by mouth as needed for migraine or headache. Reported on 04/01/2015   vitamin B-12 1000 MCG tablet Commonly known as:  CYANOCOBALAMIN Take 1,000 mcg by mouth every morning.       Allergies:  Allergies  Allergen Reactions  . Demerol Other (See Comments)    Syncope Passed out  . Topamax Other (See Comments)    Had trouble walking  . Ciprofloxacin Other (See Comments)    UNSPECIFIED REACTION   . Avelox [Moxifloxacin Hcl In Nacl] Other (See Comments)    Hypotension.  . Mefoxin [Cefoxitin Sodium In Dextrose] Rash    Past Medical History, Surgical history, Social history, and Family History were reviewed and updated.  Review of Systems: All other 10 point review of systems is negative.   Physical Exam:  weight is 137 lb (62.1 kg). Her oral temperature is 97.8 F (36.6 C). Her blood pressure is 115/72 and her pulse is 93. Her respiration is 16 and oxygen saturation is 99%.   Wt Readings from Last 3 Encounters:  03/31/16 137 lb (62.1 kg)  02/04/16 132 lb 4 oz (60 kg)  01/07/16 133 lb 12.8 oz (60.7 kg)    Well-developed and well-nourished white female in no obvious distress. Head and neck exam shows no ocular or oral lesions. There are no palpable cervical or supraclavicular lymph nodes. Lungs are clear on the right side. There is still some slight decrease breath sounds on the left side. Cardiac exam regular rate and rhythm with no murmurs, rubs or bruits. Abdomen is soft. She is good bowel sounds. There is no fluid wave. There is no palpable  liver or spleen tip. Back exam shows no tenderness over the spine, ribs or hips. Extremities shows no clubbing, cyanosis or edema. Skin exam shows no rashes, ecchymoses or petechia. Neurological exam shows no focal neurological deficits.   Lab Results  Component Value Date   WBC 6.2 03/31/2016   HGB 11.2 (L) 03/31/2016   HCT 34.6 (L) 03/31/2016   MCV 87 03/31/2016   PLT 232 03/31/2016   Lab Results  Component Value Date   FERRITIN 144 03/16/2016   IRON 68 03/16/2016   TIBC 269 03/16/2016   UIBC 201 03/16/2016   IRONPCTSAT 25 03/16/2016   Lab Results  Component Value Date   RETICCTPCT 1.4 08/13/2008  RBC 3.98 03/31/2016   RETICCTABS 61.6 08/13/2008   No results found for: KPAFRELGTCHN, LAMBDASER, KAPLAMBRATIO No results found for: IGGSERUM, IGA, IGMSERUM No results found for: Odetta Pink, SPEI   Chemistry      Component Value Date/Time   NA 142 03/31/2016 1147   NA 140 07/22/2015 1255   K 3.9 03/31/2016 1147   K 4.0 07/22/2015 1255   CL 106 03/31/2016 1147   CO2 25 03/31/2016 1147   CO2 27 07/22/2015 1255   BUN 11 03/31/2016 1147   BUN 9.8 07/22/2015 1255   CREATININE 0.7 03/31/2016 1147   CREATININE 0.6 07/22/2015 1255      Component Value Date/Time   CALCIUM 8.9 03/31/2016 1147   CALCIUM 9.1 07/22/2015 1255   ALKPHOS 85 (H) 03/31/2016 1147   AST 24 03/31/2016 1147   ALT 20 03/31/2016 1147   BILITOT 0.50 03/31/2016 1147     Impression and Plan: Ms. Weinel is a pleasant 66 yo white female with metastatic adenocarcinoma of the lung. She has no "Driver" mutations that we can target.  Thankfully, the PET scan shows that everything looks relatively stable. This is certainly a victory for [place].  She is complaining of pain on her right side. She has had this pain, on and off, for several years. This pain really started after she had her lobectomy surgery. I have to believe that this is neuropathic type pain and not  secondary to malignancy. I will talk to her pain specialist and see if he can do some kind of nerve block in that area.  She is still undecided about a repeat vocal cord injection.  Again I'm just thankful that her cancer has not progressed. She has done exceedingly well with the Abraxane/Avastin combination.  We will get her back in 2 more weeks for follow-up.      Volanda Napoleon, MD 2/22/20181:38 PM

## 2016-04-14 ENCOUNTER — Other Ambulatory Visit (HOSPITAL_BASED_OUTPATIENT_CLINIC_OR_DEPARTMENT_OTHER): Payer: PPO

## 2016-04-14 ENCOUNTER — Ambulatory Visit (HOSPITAL_BASED_OUTPATIENT_CLINIC_OR_DEPARTMENT_OTHER): Payer: PPO | Admitting: Hematology & Oncology

## 2016-04-14 ENCOUNTER — Ambulatory Visit (HOSPITAL_BASED_OUTPATIENT_CLINIC_OR_DEPARTMENT_OTHER): Payer: PPO

## 2016-04-14 ENCOUNTER — Ambulatory Visit (HOSPITAL_BASED_OUTPATIENT_CLINIC_OR_DEPARTMENT_OTHER)
Admission: RE | Admit: 2016-04-14 | Discharge: 2016-04-14 | Disposition: A | Discharge status: Home / Self Care | Payer: PPO | Source: Ambulatory Visit | Attending: Hematology & Oncology | Admitting: Hematology & Oncology

## 2016-04-14 ENCOUNTER — Ambulatory Visit: Payer: PPO

## 2016-04-14 VITALS — BP 119/80 | HR 98

## 2016-04-14 VITALS — BP 116/76 | HR 105 | Temp 98.6°F | Resp 18 | Wt 135.0 lb

## 2016-04-14 DIAGNOSIS — C7951 Secondary malignant neoplasm of bone: Secondary | ICD-10-CM | POA: Diagnosis not present

## 2016-04-14 DIAGNOSIS — Z5111 Encounter for antineoplastic chemotherapy: Secondary | ICD-10-CM | POA: Diagnosis not present

## 2016-04-14 DIAGNOSIS — C3491 Malignant neoplasm of unspecified part of right bronchus or lung: Secondary | ICD-10-CM

## 2016-04-14 DIAGNOSIS — Z5112 Encounter for antineoplastic immunotherapy: Secondary | ICD-10-CM

## 2016-04-14 DIAGNOSIS — R300 Dysuria: Secondary | ICD-10-CM

## 2016-04-14 DIAGNOSIS — C3492 Malignant neoplasm of unspecified part of left bronchus or lung: Secondary | ICD-10-CM

## 2016-04-14 DIAGNOSIS — R918 Other nonspecific abnormal finding of lung field: Secondary | ICD-10-CM | POA: Insufficient documentation

## 2016-04-14 DIAGNOSIS — C342 Malignant neoplasm of middle lobe, bronchus or lung: Secondary | ICD-10-CM

## 2016-04-14 DIAGNOSIS — J910 Malignant pleural effusion: Secondary | ICD-10-CM | POA: Diagnosis not present

## 2016-04-14 DIAGNOSIS — C3490 Malignant neoplasm of unspecified part of unspecified bronchus or lung: Secondary | ICD-10-CM | POA: Diagnosis not present

## 2016-04-14 DIAGNOSIS — J181 Lobar pneumonia, unspecified organism: Secondary | ICD-10-CM | POA: Diagnosis not present

## 2016-04-14 LAB — CMP (CANCER CENTER ONLY)
ALT(SGPT): 15 U/L (ref 10–47)
AST: 23 U/L (ref 11–38)
Albumin: 3.7 g/dL (ref 3.3–5.5)
Alkaline Phosphatase: 96 U/L — ABNORMAL HIGH (ref 26–84)
BILIRUBIN TOTAL: 0.6 mg/dL (ref 0.20–1.60)
BUN, Bld: 15 mg/dL (ref 7–22)
CALCIUM: 9.2 mg/dL (ref 8.0–10.3)
CO2: 27 meq/L (ref 18–33)
Chloride: 103 mEq/L (ref 98–108)
Creat: 0.7 mg/dl (ref 0.6–1.2)
GLUCOSE: 119 mg/dL — AB (ref 73–118)
POTASSIUM: 3.7 meq/L (ref 3.3–4.7)
Sodium: 142 mEq/L (ref 128–145)
Total Protein: 7.2 g/dL (ref 6.4–8.1)

## 2016-04-14 LAB — URINALYSIS, MICROSCOPIC (CHCC SATELLITE)
BACTERIA UA: NEGATIVE
Bilirubin (Urine): NEGATIVE
Blood: NEGATIVE
GLUCOSE UR: NEGATIVE mg/dL
KETONES: NEGATIVE mg/dL
Nitrite: NEGATIVE
PH: 6 (ref 4.60–8.00)
PROTEIN: NEGATIVE mg/dL
SPECIFIC GRAVITY, URINE: 1.01 (ref 1.003–1.035)
UROBILINOGEN UR: 0.2 mg/dL (ref 0.2–1)

## 2016-04-14 LAB — CBC WITH DIFFERENTIAL (CANCER CENTER ONLY)
BASO#: 0.1 10*3/uL (ref 0.0–0.2)
BASO%: 1 % (ref 0.0–2.0)
EOS%: 1.6 % (ref 0.0–7.0)
Eosinophils Absolute: 0.1 10*3/uL (ref 0.0–0.5)
HEMATOCRIT: 34.5 % — AB (ref 34.8–46.6)
HGB: 11.3 g/dL — ABNORMAL LOW (ref 11.6–15.9)
LYMPH#: 1.6 10*3/uL (ref 0.9–3.3)
LYMPH%: 23.8 % (ref 14.0–48.0)
MCH: 28 pg (ref 26.0–34.0)
MCHC: 32.8 g/dL (ref 32.0–36.0)
MCV: 86 fL (ref 81–101)
MONO#: 0.8 10*3/uL (ref 0.1–0.9)
MONO%: 11 % (ref 0.0–13.0)
NEUT#: 4.3 10*3/uL (ref 1.5–6.5)
NEUT%: 62.6 % (ref 39.6–80.0)
Platelets: 255 10*3/uL (ref 145–400)
RBC: 4.03 10*6/uL (ref 3.70–5.32)
RDW: 18.7 % — AB (ref 11.1–15.7)
WBC: 6.8 10*3/uL (ref 3.9–10.0)

## 2016-04-14 LAB — LACTATE DEHYDROGENASE: LDH: 214 U/L (ref 125–245)

## 2016-04-14 MED ORDER — SODIUM CHLORIDE 0.9 % IV SOLN
Freq: Once | INTRAVENOUS | Status: AC
  Administered 2016-04-14: 14:00:00 via INTRAVENOUS

## 2016-04-14 MED ORDER — PACLITAXEL PROTEIN-BOUND CHEMO INJECTION 100 MG
81.0000 mg/m2 | Freq: Once | INTRAVENOUS | Status: AC
  Administered 2016-04-14: 125 mg via INTRAVENOUS
  Filled 2016-04-14: qty 25

## 2016-04-14 MED ORDER — DENOSUMAB 120 MG/1.7ML ~~LOC~~ SOLN
120.0000 mg | Freq: Once | SUBCUTANEOUS | Status: AC
  Administered 2016-04-14: 120 mg via SUBCUTANEOUS
  Filled 2016-04-14: qty 1.7

## 2016-04-14 MED ORDER — PROCHLORPERAZINE MALEATE 10 MG PO TABS
ORAL_TABLET | ORAL | Status: AC
  Filled 2016-04-14: qty 1

## 2016-04-14 MED ORDER — SODIUM CHLORIDE 0.9 % IV SOLN
10.5000 mg/kg | Freq: Once | INTRAVENOUS | Status: AC
  Administered 2016-04-14: 600 mg via INTRAVENOUS
  Filled 2016-04-14: qty 16

## 2016-04-14 MED ORDER — SODIUM CHLORIDE 0.9 % IV SOLN: Freq: Once | INTRAVENOUS | Status: AC

## 2016-04-14 MED ORDER — SODIUM CHLORIDE 0.9% FLUSH
10.0000 mL | INTRAVENOUS | Status: DC | PRN
  Administered 2016-04-14: 10 mL
  Filled 2016-04-14: qty 10

## 2016-04-14 MED ORDER — PROCHLORPERAZINE MALEATE 10 MG PO TABS
10.0000 mg | ORAL_TABLET | Freq: Once | ORAL | Status: AC
  Administered 2016-04-14: 10 mg via ORAL

## 2016-04-14 MED ORDER — HEPARIN SOD (PORK) LOCK FLUSH 100 UNIT/ML IV SOLN
500.0000 [IU] | Freq: Once | INTRAVENOUS | Status: AC | PRN
  Administered 2016-04-14: 500 [IU]
  Filled 2016-04-14: qty 5

## 2016-04-14 MED ORDER — SULFAMETHOXAZOLE-TRIMETHOPRIM 800-160 MG PO TABS: 1.0000 | ORAL_TABLET | Freq: Two times a day (BID) | ORAL | 0 refills | Status: DC

## 2016-04-14 NOTE — Patient Instructions (Addendum)
Nanoparticle Albumin-Bound Paclitaxel injection What is this medicine? NANOPARTICLE ALBUMIN-BOUND PACLITAXEL (Na no PAHR ti kuhl al BYOO muhn-bound PAK li TAX el) is a chemotherapy drug. It targets fast dividing cells, like cancer cells, and causes these cells to die. This medicine is used to treat advanced breast cancer and advanced lung cancer. This medicine may be used for other purposes; ask your health care provider or pharmacist if you have questions. COMMON BRAND NAME(S): Abraxane What should I tell my health care provider before I take this medicine? They need to know if you have any of these conditions: -kidney disease -liver disease -low blood counts, like low platelets, red blood cells, or white blood cells -recent or ongoing radiation therapy -an unusual or allergic reaction to paclitaxel, albumin, other chemotherapy, other medicines, foods, dyes, or preservatives -pregnant or trying to get pregnant -breast-feeding How should I use this medicine? This drug is given as an infusion into a vein. It is administered in a hospital or clinic by a specially trained health care professional. Talk to your pediatrician regarding the use of this medicine in children. Special care may be needed. Overdosage: If you think you have taken too much of this medicine contact a poison control center or emergency room at once. NOTE: This medicine is only for you. Do not share this medicine with others. What if I miss a dose? It is important not to miss your dose. Call your doctor or health care professional if you are unable to keep an appointment. What may interact with this medicine? -cyclosporine -diazepam -ketoconazole -medicines to increase blood counts like filgrastim, pegfilgrastim, sargramostim -other chemotherapy drugs like cisplatin, doxorubicin, epirubicin, etoposide, teniposide, vincristine -quinidine -testosterone -vaccines -verapamil Talk to your doctor or health care professional  before taking any of these medicines: -acetaminophen -aspirin -ibuprofen -ketoprofen -naproxen This list may not describe all possible interactions. Give your health care provider a list of all the medicines, herbs, non-prescription drugs, or dietary supplements you use. Also tell them if you smoke, drink alcohol, or use illegal drugs. Some items may interact with your medicine. What should I watch for while using this medicine? Your condition will be monitored carefully while you are receiving this medicine. You will need important blood work done while you are taking this medicine. This medicine can cause serious allergic reactions. If you experience allergic reactions like skin rash, itching or hives, swelling of the face, lips, or tongue, tell your doctor or health care professional right away. In some cases, you may be given additional medicines to help with side effects. Follow all directions for their use. This drug may make you feel generally unwell. This is not uncommon, as chemotherapy can affect healthy cells as well as cancer cells. Report any side effects. Continue your course of treatment even though you feel ill unless your doctor tells you to stop. Call your doctor or health care professional for advice if you get a fever, chills or sore throat, or other symptoms of a cold or flu. Do not treat yourself. This drug decreases your body's ability to fight infections. Try to avoid being around people who are sick. This medicine may increase your risk to bruise or bleed. Call your doctor or health care professional if you notice any unusual bleeding. Be careful brushing and flossing your teeth or using a toothpick because you may get an infection or bleed more easily. If you have any dental work done, tell your dentist you are receiving this medicine. Avoid taking products that  contain aspirin, acetaminophen, ibuprofen, naproxen, or ketoprofen unless instructed by your doctor. These  medicines may hide a fever. Do not become pregnant while taking this medicine. Women should inform their doctor if they wish to become pregnant or think they might be pregnant. There is a potential for serious side effects to an unborn child. Talk to your health care professional or pharmacist for more information. Do not breast-feed an infant while taking this medicine. Men are advised not to father a child while receiving this medicine. What side effects may I notice from receiving this medicine? Side effects that you should report to your doctor or health care professional as soon as possible: -allergic reactions like skin rash, itching or hives, swelling of the face, lips, or tongue -low blood counts - This drug may decrease the number of white blood cells, red blood cells and platelets. You may be at increased risk for infections and bleeding. -signs of infection - fever or chills, cough, sore throat, pain or difficulty passing urine -signs of decreased platelets or bleeding - bruising, pinpoint red spots on the skin, black, tarry stools, nosebleeds -signs of decreased red blood cells - unusually weak or tired, fainting spells, lightheadedness -breathing problems -changes in vision -chest pain -high or low blood pressure -mouth sores -nausea and vomiting -pain, swelling, redness or irritation at the injection site -pain, tingling, numbness in the hands or feet -slow or irregular heartbeat -swelling of the ankle, feet, hands Side effects that usually do not require medical attention (report to your doctor or health care professional if they continue or are bothersome): -aches, pains -changes in the color of fingernails -diarrhea -hair loss -loss of appetite This list may not describe all possible side effects. Call your doctor for medical advice about side effects. You may report side effects to FDA at 1-800-FDA-1088. Where should I keep my medicine? This drug is given in a hospital  or clinic and will not be stored at home. NOTE: This sheet is a summary. It may not cover all possible information. If you have questions about this medicine, talk to your doctor, pharmacist, or health care provider.  2018 [employer] Standard (2014-11-26 10:05:20) Bevacizumab injection What is this medicine? BEVACIZUMAB (be va SIZ yoo mab) is a monoclonal antibody. It is used to treat many types of cancer. This medicine may be used for other purposes; ask your health care provider or pharmacist if you have questions. COMMON BRAND NAME(S): Avastin What should I tell my health care provider before I take this medicine? They need to know if you have any of these conditions: -diabetes -heart disease -high blood pressure -history of coughing up blood -prior anthracycline chemotherapy (e.g., doxorubicin, daunorubicin, epirubicin) -recent or ongoing radiation therapy -recent or planning to have surgery -stroke -an unusual or allergic reaction to bevacizumab, hamster proteins, mouse proteins, other medicines, foods, dyes, or preservatives -pregnant or trying to get pregnant -breast-feeding How should I use this medicine? This medicine is for infusion into a vein. It is given by a health care professional in a hospital or clinic setting. Talk to your pediatrician regarding the use of this medicine in children. Special care may be needed. Overdosage: If you think you have taken too much of this medicine contact a poison control center or emergency room at once. NOTE: This medicine is only for you. Do not share this medicine with others. What if I miss a dose? It is important not to miss your dose. Call your doctor or health care professional  if you are unable to keep an appointment. What may interact with this medicine? Interactions are not expected. This list may not describe all possible interactions. Give your health care provider a list of all the medicines, herbs, non-prescription drugs,  or dietary supplements you use. Also tell them if you smoke, drink alcohol, or use illegal drugs. Some items may interact with your medicine. What should I watch for while using this medicine? Your condition will be monitored carefully while you are receiving this medicine. You will need important blood work and urine testing done while you are taking this medicine. This medicine may increase your risk to bruise or bleed. Call your doctor or health care professional if you notice any unusual bleeding. This medicine should be started at least 28 days following major surgery and the site of the surgery should be totally healed. Check with your doctor before scheduling dental work or surgery while you are receiving this treatment. Talk to your doctor if you have recently had surgery or if you have a wound that has not healed. Do not become pregnant while taking this medicine or for 6 months after stopping it. Women should inform their doctor if they wish to become pregnant or think they might be pregnant. There is a potential for serious side effects to an unborn child. Talk to your health care professional or pharmacist for more information. Do not breast-feed an infant while taking this medicine and for 6 months after the last dose. This medicine has caused ovarian failure in some women. This medicine may interfere with the ability to have a child. You should talk to your doctor or health care professional if you are concerned about your fertility. What side effects may I notice from receiving this medicine? Side effects that you should report to your doctor or health care professional as soon as possible: -allergic reactions like skin rash, itching or hives, swelling of the face, lips, or tongue -chest pain or chest tightness -chills -coughing up blood -high fever -seizures -severe constipation -signs and symptoms of bleeding such as bloody or black, tarry stools; red or dark-brown urine; spitting up  blood or brown material that looks like coffee grounds; red spots on the skin; unusual bruising or bleeding from the eye, gums, or nose -signs and symptoms of a blood clot such as breathing problems; chest pain; severe, sudden headache; pain, swelling, warmth in the leg -signs and symptoms of a stroke like changes in vision; confusion; trouble speaking or understanding; severe headaches; sudden numbness or weakness of the face, arm or leg; trouble walking; dizziness; loss of balance or coordination -stomach pain -sweating -swelling of legs or ankles -vomiting -weight gain Side effects that usually do not require medical attention (report to your doctor or health care professional if they continue or are bothersome): -back pain -changes in taste -decreased appetite -dry skin -nausea -tiredness This list may not describe all possible side effects. Call your doctor for medical advice about side effects. You may report side effects to FDA at 1-800-FDA-1088. Where should I keep my medicine? This drug is given in a hospital or clinic and will not be stored at home. NOTE: This sheet is a summary. It may not cover all possible information. If you have questions about this medicine, talk to your doctor, pharmacist, or health care provider.  2018 [employer] Standard (2016-01-22 14:33:29) Denosumab injection What is this medicine? DENOSUMAB (den oh sue mab) slows bone breakdown. Prolia is used to treat osteoporosis in women after  menopause and in men. Delton See is used to treat a high calcium level due to cancer and to prevent bone fractures and other bone problems caused by multiple myeloma or cancer bone metastases. Delton See is also used to treat giant cell tumor of the bone. This medicine may be used for other purposes; ask your health care provider or pharmacist if you have questions. COMMON BRAND NAME(S): Prolia, XGEVA What should I tell my health care provider before I take this medicine? They need  to know if you have any of these conditions: -dental disease -having surgery or tooth extraction -infection -kidney disease -low levels of calcium or Vitamin D in the blood -malnutrition -on hemodialysis -skin conditions or sensitivity -thyroid or parathyroid disease -an unusual reaction to denosumab, other medicines, foods, dyes, or preservatives -pregnant or trying to get pregnant -breast-feeding How should I use this medicine? This medicine is for injection under the skin. It is given by a health care professional in a hospital or clinic setting. If you are getting Prolia, a special MedGuide will be given to you by the pharmacist with each prescription and refill. Be sure to read this information carefully each time. For Prolia, talk to your pediatrician regarding the use of this medicine in children. Special care may be needed. For Delton See, talk to your pediatrician regarding the use of this medicine in children. While this drug may be prescribed for children as young as 13 years for selected conditions, precautions do apply. Overdosage: If you think you have taken too much of this medicine contact a poison control center or emergency room at once. NOTE: This medicine is only for you. Do not share this medicine with others. What if I miss a dose? It is important not to miss your dose. Call your doctor or health care professional if you are unable to keep an appointment. What may interact with this medicine? Do not take this medicine with any of the following medications: -other medicines containing denosumab This medicine may also interact with the following medications: -medicines that lower your chance of fighting infection -steroid medicines like prednisone or cortisone This list may not describe all possible interactions. Give your health care provider a list of all the medicines, herbs, non-prescription drugs, or dietary supplements you use. Also tell them if you smoke, drink  alcohol, or use illegal drugs. Some items may interact with your medicine. What should I watch for while using this medicine? Visit your doctor or health care professional for regular checks on your progress. Your doctor or health care professional may order blood tests and other tests to see how you are doing. Call your doctor or health care professional for advice if you get a fever, chills or sore throat, or other symptoms of a cold or flu. Do not treat yourself. This drug may decrease your body's ability to fight infection. Try to avoid being around people who are sick. You should make sure you get enough calcium and vitamin D while you are taking this medicine, unless your doctor tells you not to. Discuss the foods you eat and the vitamins you take with your health care professional. See your dentist regularly. Brush and floss your teeth as directed. Before you have any dental work done, tell your dentist you are receiving this medicine. Do not become pregnant while taking this medicine or for 5 months after stopping it. Talk with your doctor or health care professional about your birth control options while taking this medicine. Women should inform their  doctor if they wish to become pregnant or think they might be pregnant. There is a potential for serious side effects to an unborn child. Talk to your health care professional or pharmacist for more information. What side effects may I notice from receiving this medicine? Side effects that you should report to your doctor or health care professional as soon as possible: -allergic reactions like skin rash, itching or hives, swelling of the face, lips, or tongue -bone pain -breathing problems -dizziness -jaw pain, especially after dental work -redness, blistering, peeling of the skin -signs and symptoms of infection like fever or chills; cough; sore throat; pain or trouble passing urine -signs of low calcium like fast heartbeat, muscle cramps or  muscle pain; pain, tingling, numbness in the hands or feet; seizures -unusual bleeding or bruising -unusually weak or tired Side effects that usually do not require medical attention (report to your doctor or health care professional if they continue or are bothersome): -constipation -diarrhea -headache -joint pain -loss of appetite -muscle pain -runny nose -tiredness -upset stomach This list may not describe all possible side effects. Call your doctor for medical advice about side effects. You may report side effects to FDA at 1-800-FDA-1088. Where should I keep my medicine? This medicine is only given in a clinic, doctor's office, or other health care setting and will not be stored at home. NOTE: This sheet is a summary. It may not cover all possible information. If you have questions about this medicine, talk to your doctor, pharmacist, or health care provider.  2018 [employer] Standard (2016-02-16 19:17:21)

## 2016-04-14 NOTE — Progress Notes (Signed)
In a pool This is a  Hematology and Oncology Follow Up Visit  [name] [id number] 1950-04-01 66 y.o. 04/14/2016   Principle Diagnosis:  Metastatic adenocarcinoma the lung - bone metastases, lymph node metastasis and intrapulmonary metastasis Malignant left pleural effusion  Current Therapy:  AbraxaneAvastin -s/p cycle #16  Navelbine/Avastin q 2wk dosing - s/p c#4 - progression  Tecentriq s/p cycle 4 Xgeva 120 mg subcutaneous every month Palliative radiation therapy to the neck.    Interim History:  [name] is here today for follow-up. She is complaining of pain on her right side. She's had this pain before. She's had this pain going back to when she had her lobectomy. I had to believe that this is from her lobectomy and is more neuropathic. I don't think it is related to her malignancy.  I spoke with her pain physician. He says that he can do a nerve block in the right costochondral space. However, she is not yet agreed to have this done. Hopefully, she will  She has a little bit of a cough. We did go ahead and get a chest x-ray on her today. Thankfully, the chest x-ray really showed stable disease. There was no pleural effusion. There was no obvious spread of her malignancy. She had no infiltrates.   She is complaining of a little bit of dysuria. We did check a urinalysis on her. It looks like she has a urinary tract infection. I did go ahead and give her a prescription for Bactrim to take for 5 days.   Her appetite has been doing pretty well. She has occasional nausea but no vomiting.  She's had no diarrhea. Maybe a little bit of constipation.  Overall, her performance status is ECOG 1.  Medications:  Allergies as of 04/14/2016      Reactions   Demerol Other (See Comments)   Syncope Passed out   Topamax Other (See Comments)   Had trouble walking   Ciprofloxacin Other (See Comments)   UNSPECIFIED REACTION    Avelox [moxifloxacin Hcl In Nacl] Other (See Comments)   Hypotension.   Mefoxin [cefoxitin Sodium In Dextrose] Rash      Medication List       Accurate as of 04/14/16  3:43 PM. Always use your most recent med list.          amLODipine-benazepril 5-10 MG capsule Commonly known as:  LOTREL Take 1 capsule by mouth daily.   budesonide-formoterol 160-4.5 MCG/ACT inhaler Commonly known as:  SYMBICORT Inhale 2 puffs into the lungs 2 (two) times daily as needed (SOB, wheezing).   cyclobenzaprine 5 MG tablet Commonly known as:  FLEXERIL Take 1 tablet (5 mg total) by mouth 3 (three) times daily as needed for muscle spasms.   diphenhydramine-acetaminophen 25-500 MG Tabs tablet Commonly known as:  TYLENOL PM Take 1 tablet by mouth at bedtime as needed (sleep).   HYDROcodone-homatropine 5-1.5 MG/5ML syrup Commonly known as:  HYCODAN Take 5 mLs by mouth every 6 (six) hours as needed for cough.   ibuprofen 200 MG tablet Commonly known as:  ADVIL,MOTRIN Take 600 mg by mouth every 6 (six) hours as needed for mild pain.   KLOR-CON M20 20 MEQ tablet Generic drug:  potassium chloride SA TAKE 1 TABLET BY MOUTH DAILY   lactulose 10 GM/15ML solution Commonly known as:  CHRONULAC Take 15 mLs (10 g total) by mouth daily. Reported on 07/22/2015   lidocaine-prilocaine cream Commonly known as:  EMLA Apply 1 application topically daily as needed (  pain).   loratadine 10 MG tablet Commonly known as:  CLARITIN Take 10 mg by mouth daily as needed for allergies.   meloxicam 7.5 MG tablet Commonly known as:  MOBIC Take 7.5 mg by mouth as needed.   omeprazole 40 MG capsule Commonly known as:  PRILOSEC Take 40 mg by mouth daily as needed (heartburn).   oxyCODONE 10 mg 12 hr tablet Commonly known as:  OXYCONTIN Take 1 tablet (10 mg total) by mouth every 12 (twelve) hours.   OxyCODONE HCl (Abuse Deter) 5 MG Taba Commonly known as:  OXAYDO Take 5 mg by mouth every 4 (four) hours as needed (pain).   polyethylene glycol powder powder Commonly known  as:  GLYCOLAX/MIRALAX Take 17 g by mouth once. Dissolve 1 capful of powder into any liquid and take once daily. Can increase to 2 times daily if no effect after 3-4 days.   PROAIR RESPICLICK 542 (90 Base) MCG/ACT Aepb Generic drug:  Albuterol Sulfate Inhale 2 puffs into the lungs twice daily as needed for shortness of breath or wheezing   prochlorperazine 10 MG tablet Commonly known as:  COMPAZINE Take 10 mg by mouth every 6 (six) hours as needed for nausea or vomiting.   senna-docusate 8.6-50 MG tablet Commonly known as:  Senokot-S Take 1 tablet by mouth daily.   SUMAtriptan 100 MG tablet Commonly known as:  IMITREX Take 100 mg by mouth as needed for migraine or headache. Reported on 04/01/2015   vitamin B-12 1000 MCG tablet Commonly known as:  CYANOCOBALAMIN Take 1,000 mcg by mouth every morning.       Allergies:  Allergies  Allergen Reactions  . Demerol Other (See Comments)    Syncope Passed out  . Topamax Other (See Comments)    Had trouble walking  . Ciprofloxacin Other (See Comments)    UNSPECIFIED REACTION   . Avelox [Moxifloxacin Hcl In Nacl] Other (See Comments)    Hypotension.  . Mefoxin [Cefoxitin Sodium In Dextrose] Rash    Past Medical History, Surgical history, Social history, and Family History were reviewed and updated.  Review of Systems: All other 10 point review of systems is negative.   Physical Exam:  weight is 135 lb (61.2 kg). Her oral temperature is 98.6 F (37 C). Her blood pressure is 116/76 and her pulse is 105 (abnormal). Her respiration is 18 and oxygen saturation is 99%.   Wt Readings from Last 3 Encounters:  04/14/16 135 lb (61.2 kg)  03/31/16 137 lb (62.1 kg)  02/04/16 132 lb 4 oz (60 kg)    Well-developed and well-nourished white female in no obvious distress. Head and neck exam shows no ocular or oral lesions. There are no palpable cervical or supraclavicular lymph nodes. Lungs are clear on the right side. There is still some  slight decrease breath sounds on the left side. Cardiac exam regular rate and rhythm with no murmurs, rubs or bruits. Abdomen is soft. She is good bowel sounds. There is no fluid wave. There is no palpable liver or spleen tip. Back exam shows no tenderness over the spine, ribs or hips. Extremities shows no clubbing, cyanosis or edema. Skin exam shows no rashes, ecchymoses or petechia. Neurological exam shows no focal neurological deficits.   Lab Results  Component Value Date   WBC 6.8 04/14/2016   HGB 11.3 (L) 04/14/2016   HCT 34.5 (L) 04/14/2016   MCV 86 04/14/2016   PLT 255 04/14/2016   Lab Results  Component Value Date   FERRITIN  144 03/16/2016   IRON 68 03/16/2016   TIBC 269 03/16/2016   UIBC 201 03/16/2016   IRONPCTSAT 25 03/16/2016   Lab Results  Component Value Date   RETICCTPCT 1.4 08/13/2008   RBC 4.03 04/14/2016   RETICCTABS 61.6 08/13/2008   No results found for: KPAFRELGTCHN, LAMBDASER, KAPLAMBRATIO No results found for: IGGSERUM, IGA, IGMSERUM No results found for: Odetta Pink, SPEI   Chemistry      Component Value Date/Time   NA 142 04/14/2016 1145   NA 140 07/22/2015 1255   K 3.7 04/14/2016 1145   K 4.0 07/22/2015 1255   CL 103 04/14/2016 1145   CO2 27 04/14/2016 1145   CO2 27 07/22/2015 1255   BUN 15 04/14/2016 1145   BUN 9.8 07/22/2015 1255   CREATININE 0.7 04/14/2016 1145   CREATININE 0.6 07/22/2015 1255      Component Value Date/Time   CALCIUM 9.2 04/14/2016 1145   CALCIUM 9.1 07/22/2015 1255   ALKPHOS 96 (H) 04/14/2016 1145   AST 23 04/14/2016 1145   ALT 15 04/14/2016 1145   BILITOT 0.60 04/14/2016 1145     Impression and Plan: Ms. Dhaliwal is a pleasant 66 yo white female with metastatic adenocarcinoma of the lung. She has no "Driver" mutations that we can target.  Hopefully, she will get the pain block for this pain on her right side. This really is causing her issues and is affecting her  quality of life.  We will go ahead and treat her today. I don't see any problems with treating her. Her labs look okay.  We will get her back in 2 more weeks.     Volanda Napoleon, MD 3/8/20183:43 PM

## 2016-04-14 NOTE — Patient Instructions (Signed)
Implanted Port Home Guide An implanted port is a type of central line that is placed under the skin. Central lines are used to provide IV access when treatment or nutrition needs to be given through a person's veins. Implanted ports are used for long-term IV access. An implanted port may be placed because:  You need IV medicine that would be irritating to the small veins in your hands or arms.  You need long-term IV medicines, such as antibiotics.  You need IV nutrition for a long period.  You need frequent blood draws for lab tests.  You need dialysis.  Implanted ports are usually placed in the chest area, but they can also be placed in the upper arm, the abdomen, or the leg. An implanted port has two main parts:  Reservoir. The reservoir is round and will appear as a small, raised area under your skin. The reservoir is the part where a needle is inserted to give medicines or draw blood.  Catheter. The catheter is a thin, flexible tube that extends from the reservoir. The catheter is placed into a large vein. Medicine that is inserted into the reservoir goes into the catheter and then into the vein.  How will I care for my incision site? Do not get the incision site wet. Bathe or shower as directed by your health care provider. How is my port accessed? Special steps must be taken to access the port:  Before the port is accessed, a numbing cream can be placed on the skin. This helps numb the skin over the port site.  Your health care provider uses a sterile technique to access the port. ? Your health care provider must put on a mask and sterile gloves. ? The skin over your port is cleaned carefully with an antiseptic and allowed to dry. ? The port is gently pinched between sterile gloves, and a needle is inserted into the port.  Only "non-coring" port needles should be used to access the port. Once the port is accessed, a blood return should be checked. This helps ensure that the port  is in the vein and is not clogged.  If your port needs to remain accessed for a constant infusion, a clear (transparent) bandage will be placed over the needle site. The bandage and needle will need to be changed every week, or as directed by your health care provider.  Keep the bandage covering the needle clean and dry. Do not get it wet. Follow your health care provider's instructions on how to take a shower or bath while the port is accessed.  If your port does not need to stay accessed, no bandage is needed over the port.  What is flushing? Flushing helps keep the port from getting clogged. Follow your health care provider's instructions on how and when to flush the port. Ports are usually flushed with saline solution or a medicine called heparin. The need for flushing will depend on how the port is used.  If the port is used for intermittent medicines or blood draws, the port will need to be flushed: ? After medicines have been given. ? After blood has been drawn. ? As part of routine maintenance.  If a constant infusion is running, the port may not need to be flushed.  How long will my port stay implanted? The port can stay in for as long as your health care provider thinks it is needed. When it is time for the port to come out, surgery will be   done to remove it. The procedure is similar to the one performed when the port was put in. When should I seek immediate medical care? When you have an implanted port, you should seek immediate medical care if:  You notice a bad smell coming from the incision site.  You have swelling, redness, or drainage at the incision site.  You have more swelling or pain at the port site or the surrounding area.  You have a fever that is not controlled with medicine.  This information is not intended to replace advice given to you by your health care provider. Make sure you discuss any questions you have with your health care provider. Document  Released: 01/24/2005 Document Revised: 07/02/2015 Document Reviewed: 10/01/2012 Elsevier Interactive Patient Education  2017 [employer]  

## 2016-04-27 ENCOUNTER — Other Ambulatory Visit (HOSPITAL_BASED_OUTPATIENT_CLINIC_OR_DEPARTMENT_OTHER): Payer: PPO

## 2016-04-27 ENCOUNTER — Ambulatory Visit (HOSPITAL_BASED_OUTPATIENT_CLINIC_OR_DEPARTMENT_OTHER): Payer: PPO

## 2016-04-27 ENCOUNTER — Ambulatory Visit: Payer: PPO

## 2016-04-27 DIAGNOSIS — Z5111 Encounter for antineoplastic chemotherapy: Secondary | ICD-10-CM | POA: Diagnosis not present

## 2016-04-27 DIAGNOSIS — Z5112 Encounter for antineoplastic immunotherapy: Secondary | ICD-10-CM | POA: Diagnosis not present

## 2016-04-27 DIAGNOSIS — C3491 Malignant neoplasm of unspecified part of right bronchus or lung: Secondary | ICD-10-CM

## 2016-04-27 DIAGNOSIS — C7951 Secondary malignant neoplasm of bone: Secondary | ICD-10-CM

## 2016-04-27 DIAGNOSIS — C3492 Malignant neoplasm of unspecified part of left bronchus or lung: Secondary | ICD-10-CM

## 2016-04-27 DIAGNOSIS — J910 Malignant pleural effusion: Secondary | ICD-10-CM

## 2016-04-27 DIAGNOSIS — R300 Dysuria: Secondary | ICD-10-CM

## 2016-04-27 DIAGNOSIS — C3490 Malignant neoplasm of unspecified part of unspecified bronchus or lung: Secondary | ICD-10-CM

## 2016-04-27 LAB — CBC WITH DIFFERENTIAL (CANCER CENTER ONLY)
BASO#: 0.1 10*3/uL (ref 0.0–0.2)
BASO%: 1.1 % (ref 0.0–2.0)
EOS ABS: 0.1 10*3/uL (ref 0.0–0.5)
EOS%: 1.8 % (ref 0.0–7.0)
HCT: 33.8 % — ABNORMAL LOW (ref 34.8–46.6)
HEMOGLOBIN: 10.9 g/dL — AB (ref 11.6–15.9)
LYMPH#: 1.7 10*3/uL (ref 0.9–3.3)
LYMPH%: 29.8 % (ref 14.0–48.0)
MCH: 27.9 pg (ref 26.0–34.0)
MCHC: 32.2 g/dL (ref 32.0–36.0)
MCV: 87 fL (ref 81–101)
MONO#: 0.5 10*3/uL (ref 0.1–0.9)
MONO%: 9.6 % (ref 0.0–13.0)
NEUT%: 57.7 % (ref 39.6–80.0)
NEUTROS ABS: 3.2 10*3/uL (ref 1.5–6.5)
Platelets: 237 10*3/uL (ref 145–400)
RBC: 3.9 10*6/uL (ref 3.70–5.32)
RDW: 18.5 % — ABNORMAL HIGH (ref 11.1–15.7)
WBC: 5.6 10*3/uL (ref 3.9–10.0)

## 2016-04-27 LAB — CMP (CANCER CENTER ONLY)
ALK PHOS: 79 U/L (ref 26–84)
ALT(SGPT): 15 U/L (ref 10–47)
AST: 20 U/L (ref 11–38)
Albumin: 3.5 g/dL (ref 3.3–5.5)
BILIRUBIN TOTAL: 0.5 mg/dL (ref 0.20–1.60)
BUN: 10 mg/dL (ref 7–22)
CO2: 25 mEq/L (ref 18–33)
Calcium: 8.7 mg/dL (ref 8.0–10.3)
Chloride: 105 mEq/L (ref 98–108)
Creat: 0.7 mg/dl (ref 0.6–1.2)
GLUCOSE: 129 mg/dL — AB (ref 73–118)
Potassium: 3.3 mEq/L (ref 3.3–4.7)
Sodium: 140 mEq/L (ref 128–145)
TOTAL PROTEIN: 6.7 g/dL (ref 6.4–8.1)

## 2016-04-27 MED ORDER — HEPARIN SOD (PORK) LOCK FLUSH 100 UNIT/ML IV SOLN
250.0000 [IU] | Freq: Once | INTRAVENOUS | Status: DC | PRN
  Filled 2016-04-27: qty 5

## 2016-04-27 MED ORDER — HEPARIN SOD (PORK) LOCK FLUSH 100 UNIT/ML IV SOLN
500.0000 [IU] | Freq: Once | INTRAVENOUS | Status: AC | PRN
  Administered 2016-04-27: 500 [IU]
  Filled 2016-04-27: qty 5

## 2016-04-27 MED ORDER — PROCHLORPERAZINE MALEATE 10 MG PO TABS
ORAL_TABLET | ORAL | Status: AC
Start: 2016-04-27 — End: 2016-04-27
  Filled 2016-04-27: qty 1

## 2016-04-27 MED ORDER — SODIUM CHLORIDE 0.9% FLUSH
10.0000 mL | INTRAVENOUS | Status: DC | PRN
  Administered 2016-04-27: 10 mL
  Filled 2016-04-27: qty 10

## 2016-04-27 MED ORDER — SODIUM CHLORIDE 0.9 % IV SOLN
Freq: Once | INTRAVENOUS | Status: AC
  Administered 2016-04-27: 12:00:00 via INTRAVENOUS

## 2016-04-27 MED ORDER — PACLITAXEL PROTEIN-BOUND CHEMO INJECTION 100 MG
81.0000 mg/m2 | Freq: Once | INTRAVENOUS | Status: AC
  Administered 2016-04-27: 125 mg via INTRAVENOUS
  Filled 2016-04-27: qty 25

## 2016-04-27 MED ORDER — ALTEPLASE 2 MG IJ SOLR
2.0000 mg | Freq: Once | INTRAMUSCULAR | Status: DC | PRN
  Filled 2016-04-27: qty 2

## 2016-04-27 MED ORDER — PROCHLORPERAZINE MALEATE 10 MG PO TABS
10.0000 mg | ORAL_TABLET | Freq: Once | ORAL | Status: AC
  Administered 2016-04-27: 10 mg via ORAL

## 2016-04-27 MED ORDER — SODIUM CHLORIDE 0.9% FLUSH
3.0000 mL | INTRAVENOUS | Status: DC | PRN
  Filled 2016-04-27: qty 10

## 2016-04-27 MED ORDER — SODIUM CHLORIDE 0.9 % IV SOLN
10.5000 mg/kg | Freq: Once | INTRAVENOUS | Status: AC
  Administered 2016-04-27: 600 mg via INTRAVENOUS
  Filled 2016-04-27: qty 16

## 2016-04-27 NOTE — Patient Instructions (Signed)
Nanoparticle Albumin-Bound Paclitaxel injection What is this medicine? NANOPARTICLE ALBUMIN-BOUND PACLITAXEL (Na no PAHR ti kuhl al BYOO muhn-bound PAK li TAX el) is a chemotherapy drug. It targets fast dividing cells, like cancer cells, and causes these cells to die. This medicine is used to treat advanced breast cancer and advanced lung cancer. This medicine may be used for other purposes; ask your health care provider or pharmacist if you have questions. COMMON BRAND NAME(S): Abraxane What should I tell my health care provider before I take this medicine? They need to know if you have any of these conditions: -kidney disease -liver disease -low blood counts, like low platelets, red blood cells, or white blood cells -recent or ongoing radiation therapy -an unusual or allergic reaction to paclitaxel, albumin, other chemotherapy, other medicines, foods, dyes, or preservatives -pregnant or trying to get pregnant -breast-feeding How should I use this medicine? This drug is given as an infusion into a vein. It is administered in a hospital or clinic by a specially trained health care professional. Talk to your pediatrician regarding the use of this medicine in children. Special care may be needed. Overdosage: If you think you have taken too much of this medicine contact a poison control center or emergency room at once. NOTE: This medicine is only for you. Do not share this medicine with others. What if I miss a dose? It is important not to miss your dose. Call your doctor or health care professional if you are unable to keep an appointment. What may interact with this medicine? -cyclosporine -diazepam -ketoconazole -medicines to increase blood counts like filgrastim, pegfilgrastim, sargramostim -other chemotherapy drugs like cisplatin, doxorubicin, epirubicin, etoposide, teniposide, vincristine -quinidine -testosterone -vaccines -verapamil Talk to your doctor or health care professional  before taking any of these medicines: -acetaminophen -aspirin -ibuprofen -ketoprofen -naproxen This list may not describe all possible interactions. Give your health care provider a list of all the medicines, herbs, non-prescription drugs, or dietary supplements you use. Also tell them if you smoke, drink alcohol, or use illegal drugs. Some items may interact with your medicine. What should I watch for while using this medicine? Your condition will be monitored carefully while you are receiving this medicine. You will need important blood work done while you are taking this medicine. This medicine can cause serious allergic reactions. If you experience allergic reactions like skin rash, itching or hives, swelling of the face, lips, or tongue, tell your doctor or health care professional right away. In some cases, you may be given additional medicines to help with side effects. Follow all directions for their use. This drug may make you feel generally unwell. This is not uncommon, as chemotherapy can affect healthy cells as well as cancer cells. Report any side effects. Continue your course of treatment even though you feel ill unless your doctor tells you to stop. Call your doctor or health care professional for advice if you get a fever, chills or sore throat, or other symptoms of a cold or flu. Do not treat yourself. This drug decreases your body's ability to fight infections. Try to avoid being around people who are sick. This medicine may increase your risk to bruise or bleed. Call your doctor or health care professional if you notice any unusual bleeding. Be careful brushing and flossing your teeth or using a toothpick because you may get an infection or bleed more easily. If you have any dental work done, tell your dentist you are receiving this medicine. Avoid taking products that  contain aspirin, acetaminophen, ibuprofen, naproxen, or ketoprofen unless instructed by your doctor. These  medicines may hide a fever. Do not become pregnant while taking this medicine. Women should inform their doctor if they wish to become pregnant or think they might be pregnant. There is a potential for serious side effects to an unborn child. Talk to your health care professional or pharmacist for more information. Do not breast-feed an infant while taking this medicine. Men are advised not to father a child while receiving this medicine. What side effects may I notice from receiving this medicine? Side effects that you should report to your doctor or health care professional as soon as possible: -allergic reactions like skin rash, itching or hives, swelling of the face, lips, or tongue -low blood counts - This drug may decrease the number of white blood cells, red blood cells and platelets. You may be at increased risk for infections and bleeding. -signs of infection - fever or chills, cough, sore throat, pain or difficulty passing urine -signs of decreased platelets or bleeding - bruising, pinpoint red spots on the skin, black, tarry stools, nosebleeds -signs of decreased red blood cells - unusually weak or tired, fainting spells, lightheadedness -breathing problems -changes in vision -chest pain -high or low blood pressure -mouth sores -nausea and vomiting -pain, swelling, redness or irritation at the injection site -pain, tingling, numbness in the hands or feet -slow or irregular heartbeat -swelling of the ankle, feet, hands Side effects that usually do not require medical attention (report to your doctor or health care professional if they continue or are bothersome): -aches, pains -changes in the color of fingernails -diarrhea -hair loss -loss of appetite This list may not describe all possible side effects. Call your doctor for medical advice about side effects. You may report side effects to FDA at 1-800-FDA-1088. Where should I keep my medicine? This drug is given in a hospital  or clinic and will not be stored at home. NOTE: This sheet is a summary. It may not cover all possible information. If you have questions about this medicine, talk to your doctor, pharmacist, or health care provider.  2018 Elsevier/Gold Standard (2014-11-26 10:05:20) Bevacizumab injection What is this medicine? BEVACIZUMAB (be va SIZ yoo mab) is a monoclonal antibody. It is used to treat many types of cancer. This medicine may be used for other purposes; ask your health care provider or pharmacist if you have questions. COMMON BRAND NAME(S): Avastin What should I tell my health care provider before I take this medicine? They need to know if you have any of these conditions: -diabetes -heart disease -high blood pressure -history of coughing up blood -prior anthracycline chemotherapy (e.g., doxorubicin, daunorubicin, epirubicin) -recent or ongoing radiation therapy -recent or planning to have surgery -stroke -an unusual or allergic reaction to bevacizumab, hamster proteins, mouse proteins, other medicines, foods, dyes, or preservatives -pregnant or trying to get pregnant -breast-feeding How should I use this medicine? This medicine is for infusion into a vein. It is given by a health care professional in a hospital or clinic setting. Talk to your pediatrician regarding the use of this medicine in children. Special care may be needed. Overdosage: If you think you have taken too much of this medicine contact a poison control center or emergency room at once. NOTE: This medicine is only for you. Do not share this medicine with others. What if I miss a dose? It is important not to miss your dose. Call your doctor or health care professional  if you are unable to keep an appointment. What may interact with this medicine? Interactions are not expected. This list may not describe all possible interactions. Give your health care provider a list of all the medicines, herbs, non-prescription drugs,  or dietary supplements you use. Also tell them if you smoke, drink alcohol, or use illegal drugs. Some items may interact with your medicine. What should I watch for while using this medicine? Your condition will be monitored carefully while you are receiving this medicine. You will need important blood work and urine testing done while you are taking this medicine. This medicine may increase your risk to bruise or bleed. Call your doctor or health care professional if you notice any unusual bleeding. This medicine should be started at least 28 days following major surgery and the site of the surgery should be totally healed. Check with your doctor before scheduling dental work or surgery while you are receiving this treatment. Talk to your doctor if you have recently had surgery or if you have a wound that has not healed. Do not become pregnant while taking this medicine or for 6 months after stopping it. Women should inform their doctor if they wish to become pregnant or think they might be pregnant. There is a potential for serious side effects to an unborn child. Talk to your health care professional or pharmacist for more information. Do not breast-feed an infant while taking this medicine and for 6 months after the last dose. This medicine has caused ovarian failure in some women. This medicine may interfere with the ability to have a child. You should talk to your doctor or health care professional if you are concerned about your fertility. What side effects may I notice from receiving this medicine? Side effects that you should report to your doctor or health care professional as soon as possible: -allergic reactions like skin rash, itching or hives, swelling of the face, lips, or tongue -chest pain or chest tightness -chills -coughing up blood -high fever -seizures -severe constipation -signs and symptoms of bleeding such as bloody or black, tarry stools; red or dark-brown urine; spitting up  blood or brown material that looks like coffee grounds; red spots on the skin; unusual bruising or bleeding from the eye, gums, or nose -signs and symptoms of a blood clot such as breathing problems; chest pain; severe, sudden headache; pain, swelling, warmth in the leg -signs and symptoms of a stroke like changes in vision; confusion; trouble speaking or understanding; severe headaches; sudden numbness or weakness of the face, arm or leg; trouble walking; dizziness; loss of balance or coordination -stomach pain -sweating -swelling of legs or ankles -vomiting -weight gain Side effects that usually do not require medical attention (report to your doctor or health care professional if they continue or are bothersome): -back pain -changes in taste -decreased appetite -dry skin -nausea -tiredness This list may not describe all possible side effects. Call your doctor for medical advice about side effects. You may report side effects to FDA at 1-800-FDA-1088. Where should I keep my medicine? This drug is given in a hospital or clinic and will not be stored at home. NOTE: This sheet is a summary. It may not cover all possible information. If you have questions about this medicine, talk to your doctor, pharmacist, or health care provider.  2018 Elsevier/Gold Standard (2016-01-22 14:33:29)

## 2016-04-27 NOTE — Patient Instructions (Signed)
Implanted Port Home Guide An implanted port is a type of central line that is placed under the skin. Central lines are used to provide IV access when treatment or nutrition needs to be given through a person's veins. Implanted ports are used for long-term IV access. An implanted port may be placed because:  You need IV medicine that would be irritating to the small veins in your hands or arms.  You need long-term IV medicines, such as antibiotics.  You need IV nutrition for a long period.  You need frequent blood draws for lab tests.  You need dialysis.  Implanted ports are usually placed in the chest area, but they can also be placed in the upper arm, the abdomen, or the leg. An implanted port has two main parts:  Reservoir. The reservoir is round and will appear as a small, raised area under your skin. The reservoir is the part where a needle is inserted to give medicines or draw blood.  Catheter. The catheter is a thin, flexible tube that extends from the reservoir. The catheter is placed into a large vein. Medicine that is inserted into the reservoir goes into the catheter and then into the vein.  How will I care for my incision site? Do not get the incision site wet. Bathe or shower as directed by your health care provider. How is my port accessed? Special steps must be taken to access the port:  Before the port is accessed, a numbing cream can be placed on the skin. This helps numb the skin over the port site.  Your health care provider uses a sterile technique to access the port. ? Your health care provider must put on a mask and sterile gloves. ? The skin over your port is cleaned carefully with an antiseptic and allowed to dry. ? The port is gently pinched between sterile gloves, and a needle is inserted into the port.  Only "non-coring" port needles should be used to access the port. Once the port is accessed, a blood return should be checked. This helps ensure that the port  is in the vein and is not clogged.  If your port needs to remain accessed for a constant infusion, a clear (transparent) bandage will be placed over the needle site. The bandage and needle will need to be changed every week, or as directed by your health care provider.  Keep the bandage covering the needle clean and dry. Do not get it wet. Follow your health care provider's instructions on how to take a shower or bath while the port is accessed.  If your port does not need to stay accessed, no bandage is needed over the port.  What is flushing? Flushing helps keep the port from getting clogged. Follow your health care provider's instructions on how and when to flush the port. Ports are usually flushed with saline solution or a medicine called heparin. The need for flushing will depend on how the port is used.  If the port is used for intermittent medicines or blood draws, the port will need to be flushed: ? After medicines have been given. ? After blood has been drawn. ? As part of routine maintenance.  If a constant infusion is running, the port may not need to be flushed.  How long will my port stay implanted? The port can stay in for as long as your health care provider thinks it is needed. When it is time for the port to come out, surgery will be   done to remove it. The procedure is similar to the one performed when the port was put in. When should I seek immediate medical care? When you have an implanted port, you should seek immediate medical care if:  You notice a bad smell coming from the incision site.  You have swelling, redness, or drainage at the incision site.  You have more swelling or pain at the port site or the surrounding area.  You have a fever that is not controlled with medicine.  This information is not intended to replace advice given to you by your health care provider. Make sure you discuss any questions you have with your health care provider. Document  Released: 01/24/2005 Document Revised: 07/02/2015 Document Reviewed: 10/01/2012 Elsevier Interactive Patient Education  2017 [employer]  

## 2016-04-28 ENCOUNTER — Ambulatory Visit: Payer: PPO

## 2016-04-28 ENCOUNTER — Other Ambulatory Visit: Payer: PPO

## 2016-04-30 IMAGING — CT CT ABD-PELV W/ CM
3 of 9 series · 11 of 46 positions shown, 17 images · IV contrast (omnipaque)
Comparison: Chest CT dated 09/12/2013. Abdomen and pelvis CT dated
05/02/2013.

CLINICAL DATA: Wheezing and shortness of breath for 1 month.
History of bilateral lung cancer. Abdominal pain and weight loss.

EXAM:
CT ANGIOGRAPHY CHEST
CT ABDOMEN AND PELVIS WITH CONTRAST
TECHNIQUE: Multidetector CT imaging of the chest was performed using the
standard protocol during bolus administration of intravenous
contrast. Multiplanar CT image reconstructions and MIPs were
obtained to evaluate the vascular anatomy. Multidetector CT imaging
of the abdomen and pelvis was performed using the standard protocol
during bolus administration of intravenous contrast.
CONTRAST:  100mL OMNIPAQUE IOHEXOL 350 MG/ML SOLN

[Series 5: abd/pelvis 5.0 b31f · axial · 0.84mm/px · z∈[-541,-286]mm · 4 of 85 slices shown, 9 images]
[im 17/85  soft-tissue]
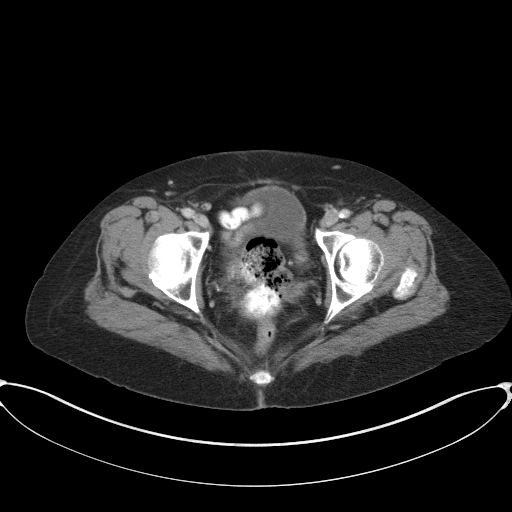
[im 17/85  lung]
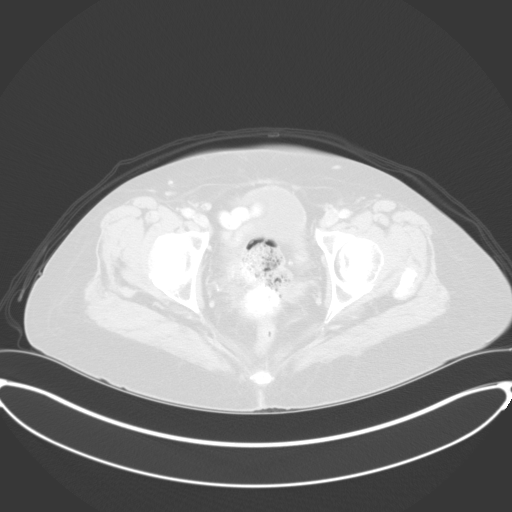
[im 17/85  bone]
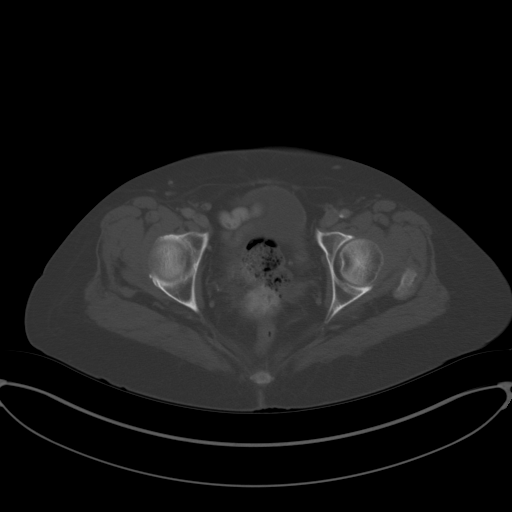
[im 34/85  soft-tissue]
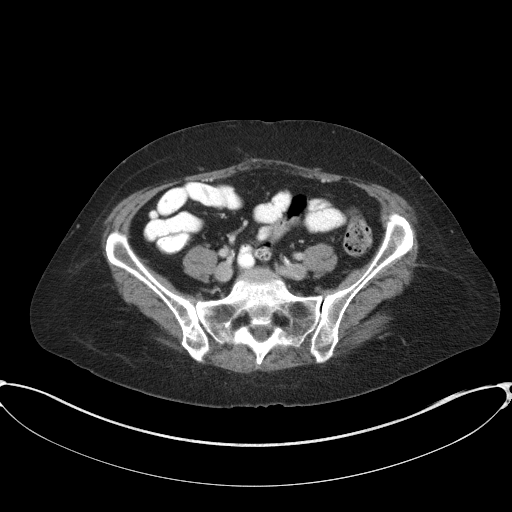
[im 34/85  lung]
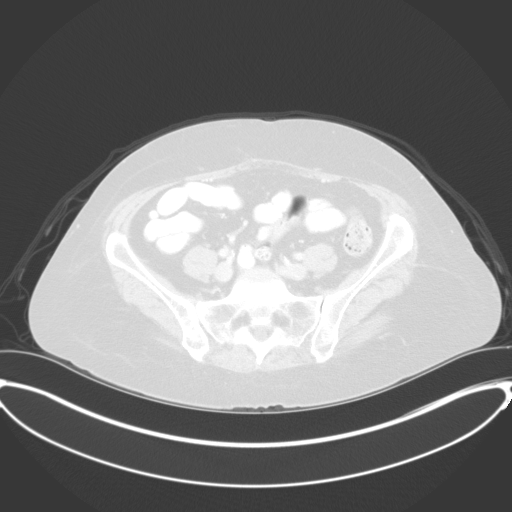
[im 51/85  soft-tissue]
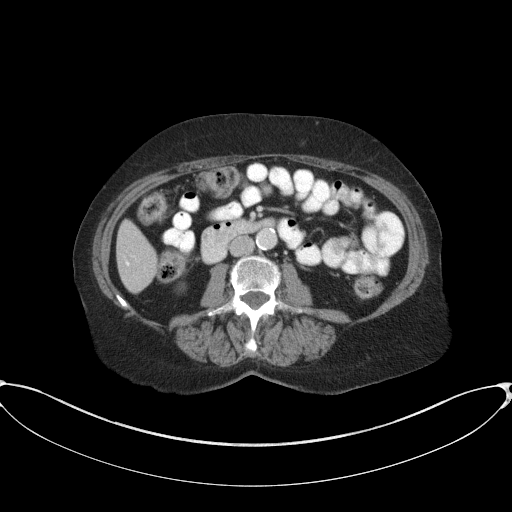
[im 51/85  lung]
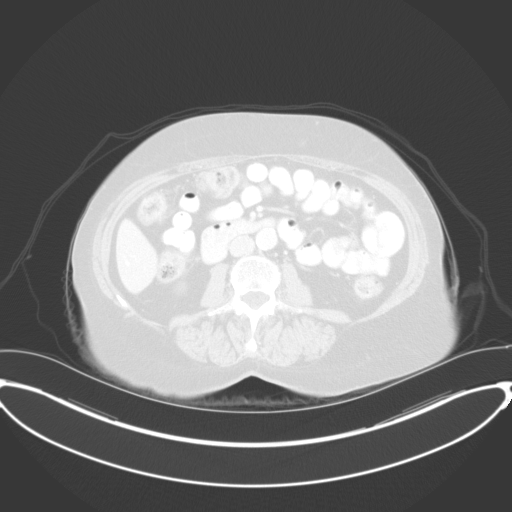
[im 68/85  soft-tissue]
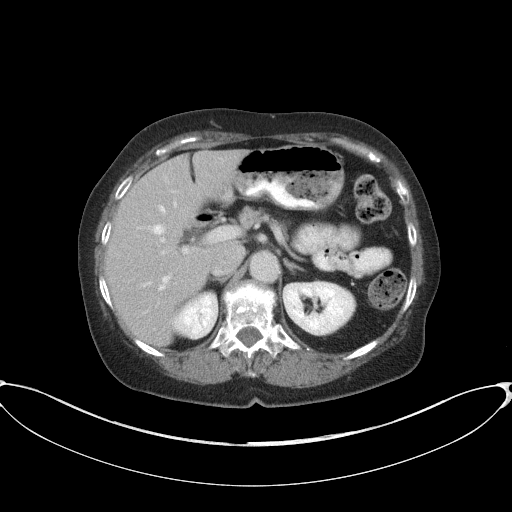
[im 68/85  lung]
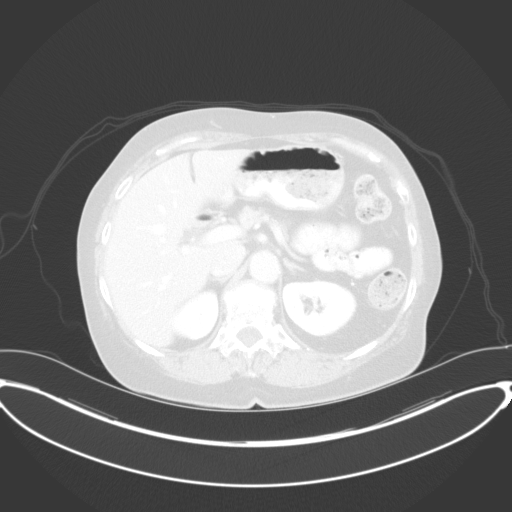

[Series 11: pe 1.0 b26f · axial · 0.67mm/px · z∈[-266,-170]mm · 4 of 241 slices shown]
[im 17/241  soft-tissue]
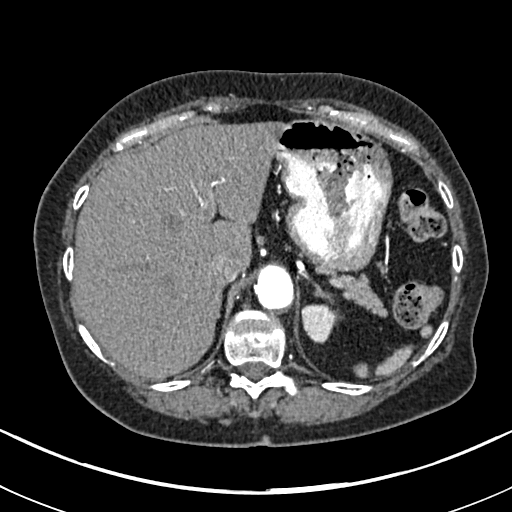
[im 49/241  soft-tissue]
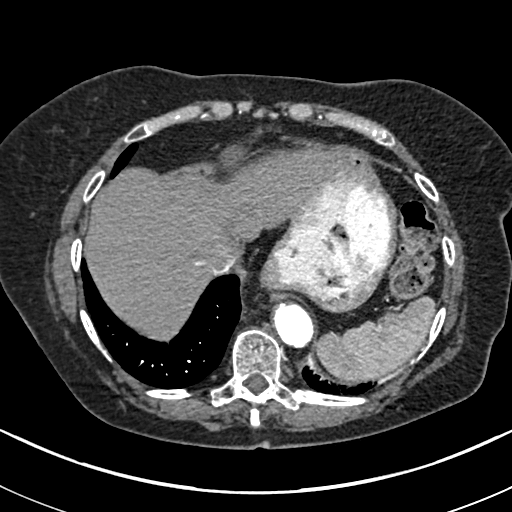
[im 81/241  soft-tissue]
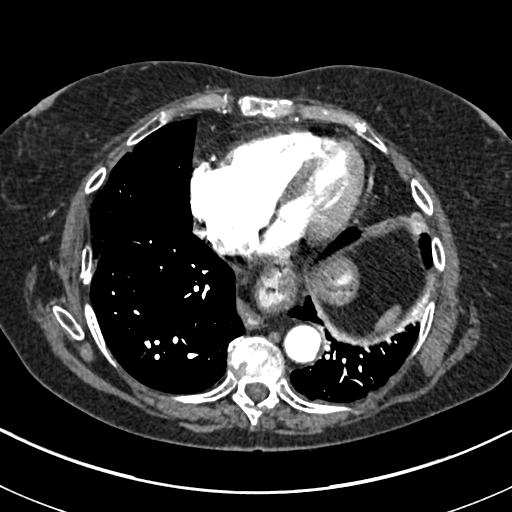
[im 113/241  soft-tissue]
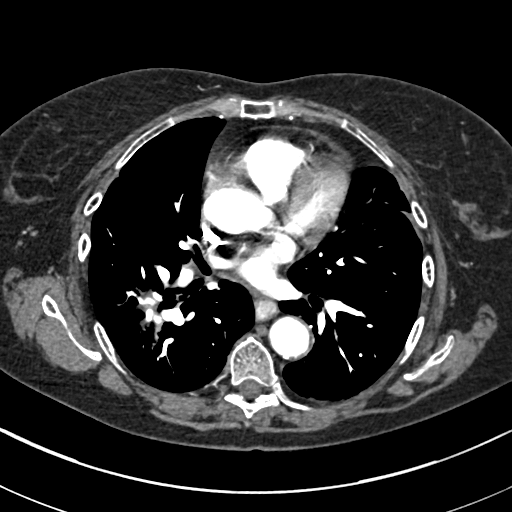

[Series 13: pe 2.0 coronal · coronal · 0.49mm/px · 3 of 126 slices shown, 4 images]
[im 32/126  soft-tissue]
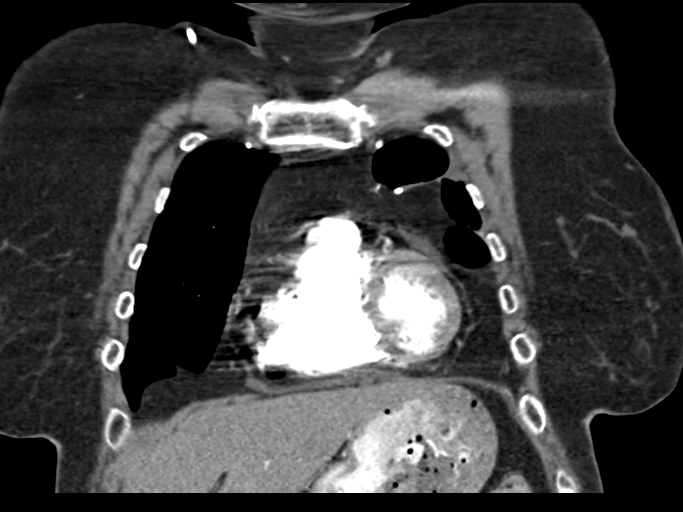
[im 63/126  soft-tissue]
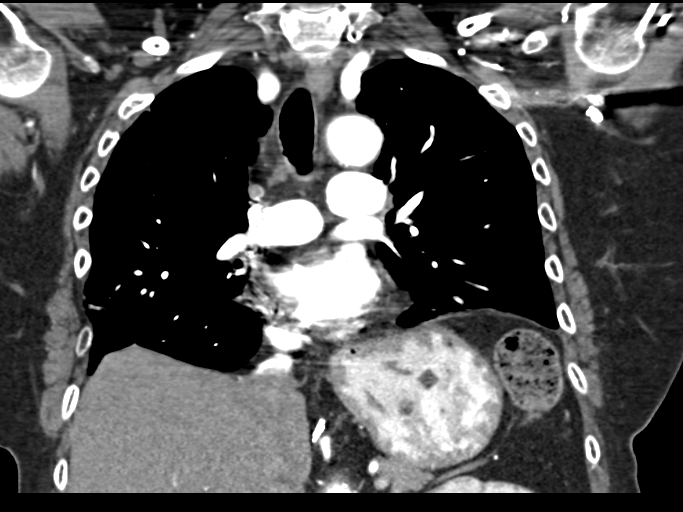
[im 63/126  bone]
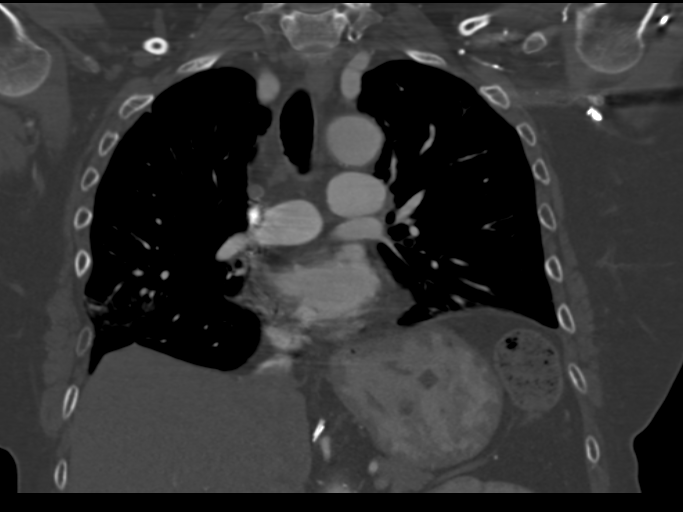
[im 94/126  soft-tissue]
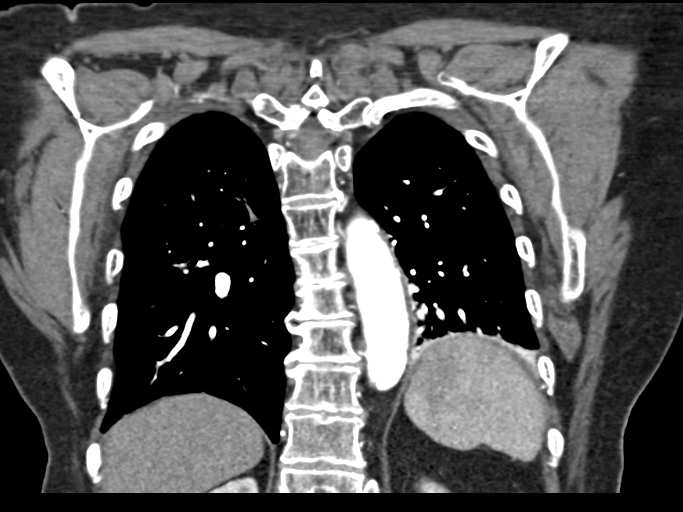

[11 of 46 positions shown; findings below may reference images not displayed]

FINDINGS: CTA CHEST FINDINGS

Normally opacified pulmonary arteries with no pulmonary arterial
filling defects seen. The previously demonstrated 3.1 x 1.9 cm
irregular areas soft tissue density in the right lower lobe appears
less irregular and slightly less confluent today, currently
measuring 3.8 x 1.8 cm on image number 44. There is interval
subsegmental atelectasis in the lingula with a rounded configuration
peripherally. There is also interval mild amount of anterior left
lower lobe atelectasis at the left lung base. Linear scarring in the
medial aspect of the right lower lobe is unchanged. No enlarged
lymph nodes. Old right seventh rib fracture laterally. This is
unchanged. No acute fracture. Thoracic spine degenerative changes.
Moderate-sized hiatal hernia without significant change.

CT ABDOMEN and PELVIS FINDINGS

Cholecystectomy clips. Normal appearing liver, spleen, pancreas,
adrenal glands, kidneys and urinary bladder. No abnormal biliary
ductal dilatation. Mild sigmoid colon diverticulosis. No small bowel
abnormalities. No evidence of appendicitis. Surgically absent
uterus. No adnexal masses. Atheromatous arterial calcifications. No
enlarged lymph nodes. Tiny umbilical hernia containing fat.
Bilateral inguinal hernias containing fat. Upper lumbar spine
degenerative changes.

Review of the MIP images confirms the above findings.
IMPRESSION: 1. No acute abnormality in the chest, abdomen or pelvis.
2. No pulmonary emboli.
3. Decreased density and irregularity of residual soft tissue
thickening in the right lower lobe.
4. Interval atelectasis in the lingula and, to a lesser surgically,
and left lower lobe.
5. Tiny umbilical hernia containing fat and bilateral inguinal
hernias containing fat.
6. Stable moderate-sized hiatal hernia.

## 2016-05-10 ENCOUNTER — Other Ambulatory Visit: Payer: Self-pay

## 2016-05-10 DIAGNOSIS — C3491 Malignant neoplasm of unspecified part of right bronchus or lung: Secondary | ICD-10-CM

## 2016-05-11 ENCOUNTER — Ambulatory Visit (HOSPITAL_BASED_OUTPATIENT_CLINIC_OR_DEPARTMENT_OTHER): Payer: PPO | Admitting: Hematology & Oncology

## 2016-05-11 ENCOUNTER — Ambulatory Visit: Payer: PPO

## 2016-05-11 ENCOUNTER — Ambulatory Visit (HOSPITAL_BASED_OUTPATIENT_CLINIC_OR_DEPARTMENT_OTHER): Payer: PPO

## 2016-05-11 ENCOUNTER — Other Ambulatory Visit (HOSPITAL_BASED_OUTPATIENT_CLINIC_OR_DEPARTMENT_OTHER): Payer: PPO

## 2016-05-11 DIAGNOSIS — C3490 Malignant neoplasm of unspecified part of unspecified bronchus or lung: Secondary | ICD-10-CM

## 2016-05-11 DIAGNOSIS — C7951 Secondary malignant neoplasm of bone: Secondary | ICD-10-CM

## 2016-05-11 DIAGNOSIS — D508 Other iron deficiency anemias: Secondary | ICD-10-CM

## 2016-05-11 DIAGNOSIS — Z5111 Encounter for antineoplastic chemotherapy: Secondary | ICD-10-CM

## 2016-05-11 DIAGNOSIS — D509 Iron deficiency anemia, unspecified: Secondary | ICD-10-CM

## 2016-05-11 DIAGNOSIS — J910 Malignant pleural effusion: Secondary | ICD-10-CM | POA: Diagnosis not present

## 2016-05-11 DIAGNOSIS — C3491 Malignant neoplasm of unspecified part of right bronchus or lung: Secondary | ICD-10-CM

## 2016-05-11 DIAGNOSIS — K909 Intestinal malabsorption, unspecified: Secondary | ICD-10-CM

## 2016-05-11 DIAGNOSIS — Z5112 Encounter for antineoplastic immunotherapy: Secondary | ICD-10-CM | POA: Diagnosis not present

## 2016-05-11 DIAGNOSIS — D500 Iron deficiency anemia secondary to blood loss (chronic): Secondary | ICD-10-CM

## 2016-05-11 LAB — CMP (CANCER CENTER ONLY)
ALBUMIN: 3.8 g/dL (ref 3.3–5.5)
ALK PHOS: 90 U/L — AB (ref 26–84)
ALT(SGPT): 17 U/L (ref 10–47)
AST: 21 U/L (ref 11–38)
BUN, Bld: 11 mg/dL (ref 7–22)
CHLORIDE: 108 meq/L (ref 98–108)
CO2: 25 mEq/L (ref 18–33)
CREATININE: 0.7 mg/dL (ref 0.6–1.2)
Calcium: 8.9 mg/dL (ref 8.0–10.3)
Glucose, Bld: 79 mg/dL (ref 73–118)
POTASSIUM: 3.8 meq/L (ref 3.3–4.7)
Sodium: 140 mEq/L (ref 128–145)
TOTAL PROTEIN: 7 g/dL (ref 6.4–8.1)
Total Bilirubin: 0.5 mg/dl (ref 0.20–1.60)

## 2016-05-11 LAB — CBC WITH DIFFERENTIAL (CANCER CENTER ONLY)
BASO#: 0.1 10*3/uL (ref 0.0–0.2)
BASO%: 1.8 % (ref 0.0–2.0)
EOS ABS: 0.1 10*3/uL (ref 0.0–0.5)
EOS%: 2.1 % (ref 0.0–7.0)
HEMATOCRIT: 35.5 % (ref 34.8–46.6)
HGB: 11.4 g/dL — ABNORMAL LOW (ref 11.6–15.9)
LYMPH#: 2 10*3/uL (ref 0.9–3.3)
LYMPH%: 32.1 % (ref 14.0–48.0)
MCH: 28 pg (ref 26.0–34.0)
MCHC: 32.1 g/dL (ref 32.0–36.0)
MCV: 87 fL (ref 81–101)
MONO#: 0.7 10*3/uL (ref 0.1–0.9)
MONO%: 11.4 % (ref 0.0–13.0)
NEUT#: 3.2 10*3/uL (ref 1.5–6.5)
NEUT%: 52.6 % (ref 39.6–80.0)
Platelets: 251 10*3/uL (ref 145–400)
RBC: 4.07 10*6/uL (ref 3.70–5.32)
RDW: 18.7 % — ABNORMAL HIGH (ref 11.1–15.7)
WBC: 6.1 10*3/uL (ref 3.9–10.0)

## 2016-05-11 LAB — LACTATE DEHYDROGENASE: LDH: 192 U/L (ref 125–245)

## 2016-05-11 LAB — UA PROTEIN, DIPSTICK - CHCC SATELLITE: Protein, Urine: <30 mg/dL

## 2016-05-11 MED ORDER — PROCHLORPERAZINE MALEATE 10 MG PO TABS
10.0000 mg | ORAL_TABLET | Freq: Once | ORAL | Status: AC
  Administered 2016-05-11: 10 mg via ORAL

## 2016-05-11 MED ORDER — DENOSUMAB 120 MG/1.7ML ~~LOC~~ SOLN
120.0000 mg | Freq: Once | SUBCUTANEOUS | Status: AC
  Administered 2016-05-11: 120 mg via SUBCUTANEOUS
  Filled 2016-05-11: qty 1.7

## 2016-05-11 MED ORDER — SODIUM CHLORIDE 0.9 % IV SOLN
Freq: Once | INTRAVENOUS | Status: AC
  Administered 2016-05-11: 13:00:00 via INTRAVENOUS

## 2016-05-11 MED ORDER — PACLITAXEL PROTEIN-BOUND CHEMO INJECTION 100 MG
81.0000 mg/m2 | Freq: Once | INTRAVENOUS | Status: AC
  Administered 2016-05-11: 125 mg via INTRAVENOUS
  Filled 2016-05-11: qty 25

## 2016-05-11 MED ORDER — SODIUM CHLORIDE 0.9% FLUSH
10.0000 mL | INTRAVENOUS | Status: DC | PRN
  Administered 2016-05-11: 10 mL
  Filled 2016-05-11: qty 10

## 2016-05-11 MED ORDER — SODIUM CHLORIDE 0.9 % IV SOLN
Freq: Once | INTRAVENOUS | Status: AC
Start: 2016-05-11 — End: 2016-05-11

## 2016-05-11 MED ORDER — HEPARIN SOD (PORK) LOCK FLUSH 100 UNIT/ML IV SOLN
500.0000 [IU] | Freq: Once | INTRAVENOUS | Status: AC | PRN
  Administered 2016-05-11: 500 [IU]
  Filled 2016-05-11: qty 5

## 2016-05-11 MED ORDER — MELOXICAM 7.5 MG PO TABS: 7.5000 mg | ORAL_TABLET | ORAL | 3 refills | Status: DC | PRN

## 2016-05-11 MED ORDER — KETOROLAC TROMETHAMINE 30 MG/ML IJ SOLN
30.0000 mg | Freq: Once | INTRAMUSCULAR | Status: AC
  Administered 2016-05-11: 30 mg via INTRAVENOUS
  Filled 2016-05-11: qty 1

## 2016-05-11 MED ORDER — KETOROLAC TROMETHAMINE 15 MG/ML IJ SOLN
INTRAMUSCULAR | Status: AC
  Filled 2016-05-11: qty 2

## 2016-05-11 MED ORDER — PROCHLORPERAZINE MALEATE 10 MG PO TABS
ORAL_TABLET | ORAL | Status: AC
  Filled 2016-05-11: qty 1

## 2016-05-11 MED ORDER — BEVACIZUMAB CHEMO INJECTION 400 MG/16ML
10.5000 mg/kg | Freq: Once | INTRAVENOUS | Status: AC
  Administered 2016-05-11: 600 mg via INTRAVENOUS
  Filled 2016-05-11: qty 16

## 2016-05-11 MED ORDER — HYDROCODONE-HOMATROPINE 5-1.5 MG/5ML PO SYRP: 5.0000 mL | ORAL_SOLUTION | Freq: Four times a day (QID) | ORAL | 0 refills | Status: DC | PRN

## 2016-05-11 NOTE — Progress Notes (Signed)
In a pool This is a  Hematology and Oncology Follow Up Visit  [name] [id number] [birth year] 66 y.o. 05/11/2016   Principle Diagnosis:  Metastatic adenocarcinoma the lung - bone metastases, lymph node metastasis and intrapulmonary metastasis Malignant left pleural effusion  Current Therapy:  AbraxaneAvastin -s/p cycle #18 Navelbine/Avastin q 2wk dosing - s/p c#4 - progression  Tecentriq s/p cycle 4 Xgeva 120 mg subcutaneous every month Palliative radiation therapy to the neck.    Interim History:  [name] is here today for follow-up. She is complaining of a lot of bony pain. She does have bad arthritis. This is osteoarthritis. She has been seen by orthopedic surgery. She has had injections into her neck to help with neuropathy and radicular pain down the left shoulder.  She is not on anything at home for arthritis. I will try her on some low back. She must take it with food. I told both she and her husband that she has to take it with food.  She is weak. She has decreased stamina. I'm checking her iron studies today.  She's had no nausea or vomiting.  She had a nice Easter. She was with family.  Overall, her performance status is ECOG 1.  Medications:  Allergies as of 05/11/2016      Reactions   Demerol Other (See Comments)   Syncope Passed out   Topamax Other (See Comments)   Had trouble walking   Ciprofloxacin Other (See Comments)   UNSPECIFIED REACTION    Avelox [moxifloxacin Hcl In Nacl] Other (See Comments)   Hypotension.   Mefoxin [cefoxitin Sodium In Dextrose] Rash      Medication List       Accurate as of 05/11/16  1:29 PM. Always use your most recent med list.          amLODipine-benazepril 5-10 MG capsule Commonly known as:  LOTREL Take 1 capsule by mouth daily.   budesonide-formoterol 160-4.5 MCG/ACT inhaler Commonly known as:  SYMBICORT Inhale 2 puffs into the lungs 2 (two) times daily as needed (SOB, wheezing).   cyclobenzaprine 5 MG  tablet Commonly known as:  FLEXERIL Take 1 tablet (5 mg total) by mouth 3 (three) times daily as needed for muscle spasms.   diphenhydramine-acetaminophen 25-500 MG Tabs tablet Commonly known as:  TYLENOL PM Take 1 tablet by mouth at bedtime as needed (sleep).   HYDROcodone-homatropine 5-1.5 MG/5ML syrup Commonly known as:  HYCODAN Take 5 mLs by mouth every 6 (six) hours as needed for cough.   KLOR-CON M20 20 MEQ tablet Generic drug:  potassium chloride SA TAKE 1 TABLET BY MOUTH DAILY   lactulose 10 GM/15ML solution Commonly known as:  CHRONULAC Take 15 mLs (10 g total) by mouth daily. Reported on 07/22/2015   lidocaine-prilocaine cream Commonly known as:  EMLA Apply 1 application topically daily as needed (pain).   loratadine 10 MG tablet Commonly known as:  CLARITIN Take 10 mg by mouth daily as needed for allergies.   meloxicam 7.5 MG tablet Commonly known as:  MOBIC Take 1 tablet (7.5 mg total) by mouth as needed.   omeprazole 40 MG capsule Commonly known as:  PRILOSEC Take 40 mg by mouth daily as needed (heartburn).   oxyCODONE 10 mg 12 hr tablet Commonly known as:  OXYCONTIN Take 1 tablet (10 mg total) by mouth every 12 (twelve) hours.   OxyCODONE HCl (Abuse Deter) 5 MG Taba Commonly known as:  OXAYDO Take 5 mg by mouth every 4 (four) hours as  needed (pain).   polyethylene glycol powder powder Commonly known as:  GLYCOLAX/MIRALAX Take 17 g by mouth once. Dissolve 1 capful of powder into any liquid and take once daily. Can increase to 2 times daily if no effect after 3-4 days.   PROAIR RESPICLICK 416 (90 Base) MCG/ACT Aepb Generic drug:  Albuterol Sulfate Inhale 2 puffs into the lungs twice daily as needed for shortness of breath or wheezing   prochlorperazine 10 MG tablet Commonly known as:  COMPAZINE Take 10 mg by mouth every 6 (six) hours as needed for nausea or vomiting.   senna-docusate 8.6-50 MG tablet Commonly known as:  Senokot-S Take 1 tablet by  mouth daily.   SUMAtriptan 100 MG tablet Commonly known as:  IMITREX Take 100 mg by mouth as needed for migraine or headache. Reported on 04/01/2015   vitamin B-12 1000 MCG tablet Commonly known as:  CYANOCOBALAMIN Take 1,000 mcg by mouth every morning.       Allergies:  Allergies  Allergen Reactions  . Demerol Other (See Comments)    Syncope Passed out  . Topamax Other (See Comments)    Had trouble walking  . Ciprofloxacin Other (See Comments)    UNSPECIFIED REACTION   . Avelox [Moxifloxacin Hcl In Nacl] Other (See Comments)    Hypotension.  . Mefoxin [Cefoxitin Sodium In Dextrose] Rash    Past Medical History, Surgical history, Social history, and Family History were reviewed and updated.  Review of Systems: All other 10 point review of systems is negative.   Physical Exam:  vitals were not taken for this visit.  Wt Readings from Last 3 Encounters:  05/11/16 131 lb 1 oz (59.4 kg)  04/14/16 135 lb (61.2 kg)  03/31/16 137 lb (62.1 kg)    Well-developed and well-nourished white female in no obvious distress. Head and neck exam shows no ocular or oral lesions. There are no palpable cervical or supraclavicular lymph nodes. Lungs are clear on the right side. There is still some slight decrease breath sounds on the left side. Cardiac exam regular rate and rhythm with no murmurs, rubs or bruits. Abdomen is soft. She is good bowel sounds. There is no fluid wave. There is no palpable liver or spleen tip. Back exam shows no tenderness over the spine, ribs or hips. Extremities shows no clubbing, cyanosis or edema. Skin exam shows no rashes, ecchymoses or petechia. Neurological exam shows no focal neurological deficits.   Lab Results  Component Value Date   WBC 6.1 05/11/2016   HGB 11.4 (L) 05/11/2016   HCT 35.5 05/11/2016   MCV 87 05/11/2016   PLT 251 05/11/2016   Lab Results  Component Value Date   FERRITIN 144 03/16/2016   IRON 68 03/16/2016   TIBC 269 03/16/2016    UIBC 201 03/16/2016   IRONPCTSAT 25 03/16/2016   Lab Results  Component Value Date   RETICCTPCT 1.4 08/13/2008   RBC 4.07 05/11/2016   RETICCTABS 61.6 08/13/2008   No results found for: KPAFRELGTCHN, LAMBDASER, KAPLAMBRATIO No results found for: IGGSERUM, IGA, IGMSERUM No results found for: Odetta Pink, SPEI   Chemistry      Component Value Date/Time   NA 140 05/11/2016 1047   NA 140 07/22/2015 1255   K 3.8 05/11/2016 1047   K 4.0 07/22/2015 1255   CL 108 05/11/2016 1047   CO2 25 05/11/2016 1047   CO2 27 07/22/2015 1255   BUN 11 05/11/2016 1047   BUN 9.8 07/22/2015  1255   CREATININE 0.7 05/11/2016 1047   CREATININE 0.6 07/22/2015 1255      Component Value Date/Time   CALCIUM 8.9 05/11/2016 1047   CALCIUM 9.1 07/22/2015 1255   ALKPHOS 90 (H) 05/11/2016 1047   AST 21 05/11/2016 1047   ALT 17 05/11/2016 1047   BILITOT 0.50 05/11/2016 1047     Impression and Plan: Ms. Helgeson is a pleasant 66 yo white female with metastatic adenocarcinoma of the lung. She has no "Driver" mutations that we can target.  I realize that she does have metastatic lung cancer. A lot of the pain that she has is osteoarthritic in nature. She does have bony involvement however with her lung cancer.  I will give her a dose of Toradol today with her chemotherapy. Over this will help a little bit.  Her last PET scan was back in February. We may have to consider getting another one in May.  She does have the hoarseness. She has not gone back to see Dr. Redmond Baseman of ENT. She has had one vocal cord injection already. She is not sure she wants another one.  As always, she has a very strong faith. She has used this on many occasions to get her through tough situations, mostly those at the old with her family.  We will get her back in 2 more weeks.     Volanda Napoleon, MD 4/4/20181:29 PM

## 2016-05-11 NOTE — Progress Notes (Signed)
Patient states she feels much better now, after the toradol injection.

## 2016-05-11 NOTE — Addendum Note (Signed)
Addended by: Burney Gauze R on: 05/11/2016 02:24 PM   Modules accepted: Orders

## 2016-05-11 NOTE — Patient Instructions (Signed)
Denosumab injection What is this medicine? DENOSUMAB (den oh sue mab) slows bone breakdown. Prolia is used to treat osteoporosis in women after menopause and in men. Delton See is used to treat a high calcium level due to cancer and to prevent bone fractures and other bone problems caused by multiple myeloma or cancer bone metastases. Delton See is also used to treat giant cell tumor of the bone. This medicine may be used for other purposes; ask your health care provider or pharmacist if you have questions. COMMON BRAND NAME(S): Prolia, XGEVA What should I tell my health care provider before I take this medicine? They need to know if you have any of these conditions: -dental disease -having surgery or tooth extraction -infection -kidney disease -low levels of calcium or Vitamin D in the blood -malnutrition -on hemodialysis -skin conditions or sensitivity -thyroid or parathyroid disease -an unusual reaction to denosumab, other medicines, foods, dyes, or preservatives -pregnant or trying to get pregnant -breast-feeding How should I use this medicine? This medicine is for injection under the skin. It is given by a health care professional in a hospital or clinic setting. If you are getting Prolia, a special MedGuide will be given to you by the pharmacist with each prescription and refill. Be sure to read this information carefully each time. For Prolia, talk to your pediatrician regarding the use of this medicine in children. Special care may be needed. For Delton See, talk to your pediatrician regarding the use of this medicine in children. While this drug may be prescribed for children as young as 13 years for selected conditions, precautions do apply. Overdosage: If you think you have taken too much of this medicine contact a poison control center or emergency room at once. NOTE: This medicine is only for you. Do not share this medicine with others. What if I miss a dose? It is important not to miss your  dose. Call your doctor or health care professional if you are unable to keep an appointment. What may interact with this medicine? Do not take this medicine with any of the following medications: -other medicines containing denosumab This medicine may also interact with the following medications: -medicines that lower your chance of fighting infection -steroid medicines like prednisone or cortisone This list may not describe all possible interactions. Give your health care provider a list of all the medicines, herbs, non-prescription drugs, or dietary supplements you use. Also tell them if you smoke, drink alcohol, or use illegal drugs. Some items may interact with your medicine. What should I watch for while using this medicine? Visit your doctor or health care professional for regular checks on your progress. Your doctor or health care professional may order blood tests and other tests to see how you are doing. Call your doctor or health care professional for advice if you get a fever, chills or sore throat, or other symptoms of a cold or flu. Do not treat yourself. This drug may decrease your body's ability to fight infection. Try to avoid being around people who are sick. You should make sure you get enough calcium and vitamin D while you are taking this medicine, unless your doctor tells you not to. Discuss the foods you eat and the vitamins you take with your health care professional. See your dentist regularly. Brush and floss your teeth as directed. Before you have any dental work done, tell your dentist you are receiving this medicine. Do not become pregnant while taking this medicine or for 5 months after stopping  it. Talk with your doctor or health care professional about your birth control options while taking this medicine. Women should inform their doctor if they wish to become pregnant or think they might be pregnant. There is a potential for serious side effects to an unborn child. Talk  to your health care professional or pharmacist for more information. What side effects may I notice from receiving this medicine? Side effects that you should report to your doctor or health care professional as soon as possible: -allergic reactions like skin rash, itching or hives, swelling of the face, lips, or tongue -bone pain -breathing problems -dizziness -jaw pain, especially after dental work -redness, blistering, peeling of the skin -signs and symptoms of infection like fever or chills; cough; sore throat; pain or trouble passing urine -signs of low calcium like fast heartbeat, muscle cramps or muscle pain; pain, tingling, numbness in the hands or feet; seizures -unusual bleeding or bruising -unusually weak or tired Side effects that usually do not require medical attention (report to your doctor or health care professional if they continue or are bothersome): -constipation -diarrhea -headache -joint pain -loss of appetite -muscle pain -runny nose -tiredness -upset stomach This list may not describe all possible side effects. Call your doctor for medical advice about side effects. You may report side effects to FDA at 1-800-FDA-1088. Where should I keep my medicine? This medicine is only given in a clinic, doctor's office, or other health care setting and will not be stored at home. NOTE: This sheet is a summary. It may not cover all possible information. If you have questions about this medicine, talk to your doctor, pharmacist, or health care provider.  2018 Elsevier/Gold Standard (2016-02-16 19:17:21) Nanoparticle Albumin-Bound Paclitaxel injection What is this medicine? NANOPARTICLE ALBUMIN-BOUND PACLITAXEL (Na no PAHR ti kuhl al BYOO muhn-bound PAK li TAX el) is a chemotherapy drug. It targets fast dividing cells, like cancer cells, and causes these cells to die. This medicine is used to treat advanced breast cancer and advanced lung cancer. This medicine may be used for  other purposes; ask your health care provider or pharmacist if you have questions. COMMON BRAND NAME(S): Abraxane What should I tell my health care provider before I take this medicine? They need to know if you have any of these conditions: -kidney disease -liver disease -low blood counts, like low platelets, red blood cells, or white blood cells -recent or ongoing radiation therapy -an unusual or allergic reaction to paclitaxel, albumin, other chemotherapy, other medicines, foods, dyes, or preservatives -pregnant or trying to get pregnant -breast-feeding How should I use this medicine? This drug is given as an infusion into a vein. It is administered in a hospital or clinic by a specially trained health care professional. Talk to your pediatrician regarding the use of this medicine in children. Special care may be needed. Overdosage: If you think you have taken too much of this medicine contact a poison control center or emergency room at once. NOTE: This medicine is only for you. Do not share this medicine with others. What if I miss a dose? It is important not to miss your dose. Call your doctor or health care professional if you are unable to keep an appointment. What may interact with this medicine? -cyclosporine -diazepam -ketoconazole -medicines to increase blood counts like filgrastim, pegfilgrastim, sargramostim -other chemotherapy drugs like cisplatin, doxorubicin, epirubicin, etoposide, teniposide, vincristine -quinidine -testosterone -vaccines -verapamil Talk to your doctor or health care professional before taking any of these medicines: -acetaminophen -aspirin -  ibuprofen -ketoprofen -naproxen This list may not describe all possible interactions. Give your health care provider a list of all the medicines, herbs, non-prescription drugs, or dietary supplements you use. Also tell them if you smoke, drink alcohol, or use illegal drugs. Some items may interact with your  medicine. What should I watch for while using this medicine? Your condition will be monitored carefully while you are receiving this medicine. You will need important blood work done while you are taking this medicine. This medicine can cause serious allergic reactions. If you experience allergic reactions like skin rash, itching or hives, swelling of the face, lips, or tongue, tell your doctor or health care professional right away. In some cases, you may be given additional medicines to help with side effects. Follow all directions for their use. This drug may make you feel generally unwell. This is not uncommon, as chemotherapy can affect healthy cells as well as cancer cells. Report any side effects. Continue your course of treatment even though you feel ill unless your doctor tells you to stop. Call your doctor or health care professional for advice if you get a fever, chills or sore throat, or other symptoms of a cold or flu. Do not treat yourself. This drug decreases your body's ability to fight infections. Try to avoid being around people who are sick. This medicine may increase your risk to bruise or bleed. Call your doctor or health care professional if you notice any unusual bleeding. Be careful brushing and flossing your teeth or using a toothpick because you may get an infection or bleed more easily. If you have any dental work done, tell your dentist you are receiving this medicine. Avoid taking products that contain aspirin, acetaminophen, ibuprofen, naproxen, or ketoprofen unless instructed by your doctor. These medicines may hide a fever. Do not become pregnant while taking this medicine. Women should inform their doctor if they wish to become pregnant or think they might be pregnant. There is a potential for serious side effects to an unborn child. Talk to your health care professional or pharmacist for more information. Do not breast-feed an infant while taking this medicine. Men are  advised not to father a child while receiving this medicine. What side effects may I notice from receiving this medicine? Side effects that you should report to your doctor or health care professional as soon as possible: -allergic reactions like skin rash, itching or hives, swelling of the face, lips, or tongue -low blood counts - This drug may decrease the number of white blood cells, red blood cells and platelets. You may be at increased risk for infections and bleeding. -signs of infection - fever or chills, cough, sore throat, pain or difficulty passing urine -signs of decreased platelets or bleeding - bruising, pinpoint red spots on the skin, black, tarry stools, nosebleeds -signs of decreased red blood cells - unusually weak or tired, fainting spells, lightheadedness -breathing problems -changes in vision -chest pain -high or low blood pressure -mouth sores -nausea and vomiting -pain, swelling, redness or irritation at the injection site -pain, tingling, numbness in the hands or feet -slow or irregular heartbeat -swelling of the ankle, feet, hands Side effects that usually do not require medical attention (report to your doctor or health care professional if they continue or are bothersome): -aches, pains -changes in the color of fingernails -diarrhea -hair loss -loss of appetite This list may not describe all possible side effects. Call your doctor for medical advice about side effects. You may  report side effects to FDA at 1-800-FDA-1088. Where should I keep my medicine? This drug is given in a hospital or clinic and will not be stored at home. NOTE: This sheet is a summary. It may not cover all possible information. If you have questions about this medicine, talk to your doctor, pharmacist, or health care provider.  2018 Elsevier/Gold Standard (2014-11-26 10:05:20) Pemetrexed injection What is this medicine? PEMETREXED (PEM e TREX ed) is a chemotherapy drug used to treat  lung cancers like non-small cell lung cancer and mesothelioma. It may also be used to treat other cancers. This medicine may be used for other purposes; ask your health care provider or pharmacist if you have questions. COMMON BRAND NAME(S): Alimta What should I tell my health care provider before I take this medicine? They need to know if you have any of these conditions: -infection (especially a virus infection such as chickenpox, cold sores, or herpes) -kidney disease -low blood counts, like low white cell, platelet, or red cell counts -lung or breathing disease, like asthma -radiation therapy -an unusual or allergic reaction to pemetrexed, other medicines, foods, dyes, or preservative -pregnant or trying to get pregnant -breast-feeding How should I use this medicine? This drug is given as an infusion into a vein. It is administered in a hospital or clinic by a specially trained health care professional. Talk to your pediatrician regarding the use of this medicine in children. Special care may be needed. Overdosage: If you think you have taken too much of this medicine contact a poison control center or emergency room at once. NOTE: This medicine is only for you. Do not share this medicine with others. What if I miss a dose? It is important not to miss your dose. Call your doctor or health care professional if you are unable to keep an appointment. What may interact with this medicine? This medicine may interact with the following medications: -Ibuprofen This list may not describe all possible interactions. Give your health care provider a list of all the medicines, herbs, non-prescription drugs, or dietary supplements you use. Also tell them if you smoke, drink alcohol, or use illegal drugs. Some items may interact with your medicine. What should I watch for while using this medicine? Visit your doctor for checks on your progress. This drug may make you feel generally unwell. This is not  uncommon, as chemotherapy can affect healthy cells as well as cancer cells. Report any side effects. Continue your course of treatment even though you feel ill unless your doctor tells you to stop. In some cases, you may be given additional medicines to help with side effects. Follow all directions for their use. Call your doctor or health care professional for advice if you get a fever, chills or sore throat, or other symptoms of a cold or flu. Do not treat yourself. This drug decreases your body's ability to fight infections. Try to avoid being around people who are sick. This medicine may increase your risk to bruise or bleed. Call your doctor or health care professional if you notice any unusual bleeding. Be careful brushing and flossing your teeth or using a toothpick because you may get an infection or bleed more easily. If you have any dental work done, tell your dentist you are receiving this medicine. Avoid taking products that contain aspirin, acetaminophen, ibuprofen, naproxen, or ketoprofen unless instructed by your doctor. These medicines may hide a fever. Call your doctor or health care professional if you get diarrhea or mouth  sores. Do not treat yourself. To protect your kidneys, drink water or other fluids as directed while you are taking this medicine. Do not become pregnant while taking this medicine or for 6 months after stopping it. Women should inform their doctor if they wish to become pregnant or think they might be pregnant. Men should not father a child while taking this medicine and for 3 months after stopping it. This may interfere with the ability to father a child. You should talk to your doctor or health care professional if you are concerned about your fertility. There is a potential for serious side effects to an unborn child. Talk to your health care professional or pharmacist for more information. Do not breast-feed an infant while taking this medicine or for 1 week after  stopping it. What side effects may I notice from receiving this medicine? Side effects that you should report to your doctor or health care professional as soon as possible: -allergic reactions like skin rash, itching or hives, swelling of the face, lips, or tongue -breathing problems -redness, blistering, peeling or loosening of the skin, including inside the mouth -signs and symptoms of bleeding such as bloody or black, tarry stools; red or dark-brown urine; spitting up blood or brown material that looks like coffee grounds; red spots on the skin; unusual bruising or bleeding from the eye, gums, or nose -signs and symptoms of infection like fever or chills; cough; sore throat; pain or trouble passing urine -signs and symptoms of kidney injury like trouble passing urine or change in the amount of urine -signs and symptoms of liver injury like dark yellow or brown urine; general ill feeling or flu-like symptoms; light-colored stools; loss of appetite; nausea; right upper belly pain; unusually weak or tired; yellowing of the eyes or skin Side effects that usually do not require medical attention (report to your doctor or health care professional if they continue or are bothersome): -constipation -dizziness -mouth sores -nausea, vomiting -pain, tingling, numbness in the hands or feet -unusually weak or tired This list may not describe all possible side effects. Call your doctor for medical advice about side effects. You may report side effects to FDA at 1-800-FDA-1088. Where should I keep my medicine? This drug is given in a hospital or clinic and will not be stored at home. NOTE: This sheet is a summary. It may not cover all possible information. If you have questions about this medicine, talk to your doctor, pharmacist, or health care provider.  2018 Elsevier/Gold Standard (2015-11-24 18:51:46)

## 2016-05-12 ENCOUNTER — Other Ambulatory Visit: Payer: PPO

## 2016-05-12 ENCOUNTER — Ambulatory Visit: Payer: PPO

## 2016-05-12 ENCOUNTER — Encounter: Payer: Self-pay | Admitting: Hematology & Oncology

## 2016-05-12 ENCOUNTER — Ambulatory Visit: Payer: PPO | Admitting: Hematology & Oncology

## 2016-05-12 DIAGNOSIS — K909 Intestinal malabsorption, unspecified: Secondary | ICD-10-CM

## 2016-05-12 DIAGNOSIS — D500 Iron deficiency anemia secondary to blood loss (chronic): Secondary | ICD-10-CM | POA: Insufficient documentation

## 2016-05-12 HISTORY — DX: Iron deficiency anemia secondary to blood loss (chronic): D50.0

## 2016-05-12 HISTORY — DX: Intestinal malabsorption, unspecified: K90.9

## 2016-05-12 LAB — IRON AND TIBC
%SAT: 21 % (ref 21–57)
Iron: 57 ug/dL (ref 41–142)
TIBC: 264 ug/dL (ref 236–444)
UIBC: 207 ug/dL (ref 120–384)

## 2016-05-12 LAB — FERRITIN: Ferritin: 165 ng/ml (ref 9–269)

## 2016-05-12 NOTE — Progress Notes (Signed)
Spoke with patient's husband, patient's iron is low and scheduling will be calling to set up day and time.

## 2016-05-12 NOTE — Addendum Note (Signed)
Addended by: Burney Gauze R on: 05/12/2016 01:55 PM   Modules accepted: Orders

## 2016-05-13 ENCOUNTER — Ambulatory Visit (HOSPITAL_BASED_OUTPATIENT_CLINIC_OR_DEPARTMENT_OTHER): Payer: PPO

## 2016-05-13 VITALS — BP 121/82 | HR 91 | Temp 97.9°F | Resp 18

## 2016-05-13 DIAGNOSIS — C7951 Secondary malignant neoplasm of bone: Secondary | ICD-10-CM

## 2016-05-13 DIAGNOSIS — C3400 Malignant neoplasm of unspecified main bronchus: Secondary | ICD-10-CM

## 2016-05-13 DIAGNOSIS — D508 Other iron deficiency anemias: Secondary | ICD-10-CM

## 2016-05-13 DIAGNOSIS — C3490 Malignant neoplasm of unspecified part of unspecified bronchus or lung: Secondary | ICD-10-CM

## 2016-05-13 MED ORDER — SODIUM CHLORIDE 0.9 % IJ SOLN
10.0000 mL | INTRAMUSCULAR | Status: DC | PRN
  Administered 2016-05-13: 10 mL via INTRAVENOUS
  Filled 2016-05-13: qty 10

## 2016-05-13 MED ORDER — SODIUM CHLORIDE 0.9 % IV SOLN
510.0000 mg | Freq: Once | INTRAVENOUS | Status: AC
  Administered 2016-05-13: 510 mg via INTRAVENOUS
  Filled 2016-05-13: qty 17

## 2016-05-13 MED ORDER — HEPARIN SOD (PORK) LOCK FLUSH 100 UNIT/ML IV SOLN
500.0000 [IU] | Freq: Once | INTRAVENOUS | Status: AC | PRN
  Administered 2016-05-13: 500 [IU] via INTRAVENOUS
  Filled 2016-05-13: qty 5

## 2016-05-13 NOTE — Patient Instructions (Signed)

## 2016-05-13 NOTE — Progress Notes (Signed)
Late entry, spoke with husband. Let him know that patient's iron is very low and she needs iv iron. [place] to schedulers for appt asap

## 2016-05-25 ENCOUNTER — Ambulatory Visit: Payer: PPO

## 2016-05-25 ENCOUNTER — Other Ambulatory Visit (HOSPITAL_BASED_OUTPATIENT_CLINIC_OR_DEPARTMENT_OTHER): Payer: PPO

## 2016-05-25 ENCOUNTER — Ambulatory Visit (HOSPITAL_BASED_OUTPATIENT_CLINIC_OR_DEPARTMENT_OTHER): Payer: PPO

## 2016-05-25 VITALS — BP 119/81

## 2016-05-25 DIAGNOSIS — C3490 Malignant neoplasm of unspecified part of unspecified bronchus or lung: Secondary | ICD-10-CM

## 2016-05-25 DIAGNOSIS — C7951 Secondary malignant neoplasm of bone: Secondary | ICD-10-CM

## 2016-05-25 DIAGNOSIS — C3492 Malignant neoplasm of unspecified part of left bronchus or lung: Secondary | ICD-10-CM

## 2016-05-25 DIAGNOSIS — J910 Malignant pleural effusion: Secondary | ICD-10-CM

## 2016-05-25 DIAGNOSIS — D509 Iron deficiency anemia, unspecified: Secondary | ICD-10-CM

## 2016-05-25 DIAGNOSIS — C3491 Malignant neoplasm of unspecified part of right bronchus or lung: Secondary | ICD-10-CM

## 2016-05-25 DIAGNOSIS — Z5112 Encounter for antineoplastic immunotherapy: Secondary | ICD-10-CM | POA: Diagnosis not present

## 2016-05-25 LAB — CBC WITH DIFFERENTIAL (CANCER CENTER ONLY)
BASO#: 0.1 10*3/uL (ref 0.0–0.2)
BASO%: 1.3 % (ref 0.0–2.0)
EOS ABS: 0.1 10*3/uL (ref 0.0–0.5)
EOS%: 2.1 % (ref 0.0–7.0)
HCT: 34.8 % (ref 34.8–46.6)
HEMOGLOBIN: 11.2 g/dL — AB (ref 11.6–15.9)
LYMPH#: 1.9 10*3/uL (ref 0.9–3.3)
LYMPH%: 31.4 % (ref 14.0–48.0)
MCH: 28.4 pg (ref 26.0–34.0)
MCHC: 32.2 g/dL (ref 32.0–36.0)
MCV: 88 fL (ref 81–101)
MONO#: 0.7 10*3/uL (ref 0.1–0.9)
MONO%: 11.1 % (ref 0.0–13.0)
NEUT%: 54.1 % (ref 39.6–80.0)
NEUTROS ABS: 3.3 10*3/uL (ref 1.5–6.5)
Platelets: 234 10*3/uL (ref 145–400)
RBC: 3.94 10*6/uL (ref 3.70–5.32)
RDW: 19.1 % — ABNORMAL HIGH (ref 11.1–15.7)
WBC: 6.1 10*3/uL (ref 3.9–10.0)

## 2016-05-25 LAB — CMP (CANCER CENTER ONLY)
ALBUMIN: 3.6 g/dL (ref 3.3–5.5)
ALT(SGPT): 16 U/L (ref 10–47)
AST: 20 U/L (ref 11–38)
Alkaline Phosphatase: 81 U/L (ref 26–84)
BILIRUBIN TOTAL: 0.5 mg/dL (ref 0.20–1.60)
BUN, Bld: 15 mg/dL (ref 7–22)
CO2: 26 meq/L (ref 18–33)
Calcium: 8.7 mg/dL (ref 8.0–10.3)
Chloride: 100 mEq/L (ref 98–108)
Creat: 0.8 mg/dl (ref 0.6–1.2)
Glucose, Bld: 95 mg/dL (ref 73–118)
Potassium: 4.2 mEq/L (ref 3.3–4.7)
Sodium: 136 mEq/L (ref 128–145)
Total Protein: 6.8 g/dL (ref 6.4–8.1)

## 2016-05-25 MED ORDER — PROCHLORPERAZINE MALEATE 10 MG PO TABS
10.0000 mg | ORAL_TABLET | Freq: Once | ORAL | Status: AC
Start: 2016-05-25 — End: 2016-05-25
  Administered 2016-05-25: 10 mg via ORAL

## 2016-05-25 MED ORDER — PROCHLORPERAZINE MALEATE 10 MG PO TABS
ORAL_TABLET | ORAL | Status: AC
  Filled 2016-05-25: qty 1

## 2016-05-25 MED ORDER — PACLITAXEL PROTEIN-BOUND CHEMO INJECTION 100 MG
81.0000 mg/m2 | Freq: Once | INTRAVENOUS | Status: AC
  Administered 2016-05-25: 125 mg via INTRAVENOUS
  Filled 2016-05-25: qty 25

## 2016-05-25 MED ORDER — SODIUM CHLORIDE 0.9% FLUSH
10.0000 mL | INTRAVENOUS | Status: DC | PRN
  Administered 2016-05-25: 10 mL
  Filled 2016-05-25: qty 10

## 2016-05-25 MED ORDER — HEPARIN SOD (PORK) LOCK FLUSH 100 UNIT/ML IV SOLN
500.0000 [IU] | Freq: Once | INTRAVENOUS | Status: AC | PRN
  Administered 2016-05-25: 500 [IU]
  Filled 2016-05-25: qty 5

## 2016-05-25 MED ORDER — SODIUM CHLORIDE 0.9 % IV SOLN
10.5000 mg/kg | Freq: Once | INTRAVENOUS | Status: AC
  Administered 2016-05-25: 600 mg via INTRAVENOUS
  Filled 2016-05-25: qty 16

## 2016-05-25 MED ORDER — SODIUM CHLORIDE 0.9 % IV SOLN
Freq: Once | INTRAVENOUS | Status: AC
Start: 2016-05-25 — End: 2016-05-25
  Administered 2016-05-25: 14:00:00 via INTRAVENOUS

## 2016-05-25 NOTE — Patient Instructions (Signed)
[facility] Discharge Instructions for Patients Receiving Chemotherapy  Today you received the following chemotherapy agents Abraxane and Avastin.  To help prevent nausea and vomiting after your treatment, we encourage you to take your nausea medication as directed.   If you develop nausea and vomiting that is not controlled by your nausea medication, call the clinic.   BELOW ARE SYMPTOMS THAT SHOULD BE REPORTED IMMEDIATELY:  *FEVER GREATER THAN 100.5 F  *CHILLS WITH OR WITHOUT FEVER  NAUSEA AND VOMITING THAT IS NOT CONTROLLED WITH YOUR NAUSEA MEDICATION  *UNUSUAL SHORTNESS OF BREATH  *UNUSUAL BRUISING OR BLEEDING  TENDERNESS IN MOUTH AND THROAT WITH OR WITHOUT PRESENCE OF ULCERS  *URINARY PROBLEMS  *BOWEL PROBLEMS  UNUSUAL RASH Items with * indicate a potential emergency and should be followed up as soon as possible.  Feel free to call the clinic you have any questions or concerns. The clinic phone number is [phone].  Please show the [facility] at check-in to the Emergency Department and triage nurse.

## 2016-05-26 ENCOUNTER — Ambulatory Visit: Payer: PPO

## 2016-05-26 ENCOUNTER — Ambulatory Visit: Payer: PPO | Admitting: Hematology & Oncology

## 2016-05-26 ENCOUNTER — Other Ambulatory Visit: Payer: PPO

## 2016-06-07 ENCOUNTER — Other Ambulatory Visit: Payer: Self-pay | Admitting: *Deleted

## 2016-06-07 DIAGNOSIS — C3490 Malignant neoplasm of unspecified part of unspecified bronchus or lung: Secondary | ICD-10-CM

## 2016-06-08 ENCOUNTER — Other Ambulatory Visit: Payer: Self-pay | Admitting: Hematology & Oncology

## 2016-06-08 ENCOUNTER — Ambulatory Visit: Payer: PPO

## 2016-06-08 ENCOUNTER — Other Ambulatory Visit (HOSPITAL_BASED_OUTPATIENT_CLINIC_OR_DEPARTMENT_OTHER): Payer: PPO

## 2016-06-08 ENCOUNTER — Ambulatory Visit (HOSPITAL_BASED_OUTPATIENT_CLINIC_OR_DEPARTMENT_OTHER): Payer: PPO | Admitting: Hematology & Oncology

## 2016-06-08 ENCOUNTER — Ambulatory Visit (HOSPITAL_BASED_OUTPATIENT_CLINIC_OR_DEPARTMENT_OTHER): Payer: PPO

## 2016-06-08 DIAGNOSIS — C3490 Malignant neoplasm of unspecified part of unspecified bronchus or lung: Secondary | ICD-10-CM

## 2016-06-08 DIAGNOSIS — J910 Malignant pleural effusion: Secondary | ICD-10-CM | POA: Diagnosis not present

## 2016-06-08 DIAGNOSIS — C7951 Secondary malignant neoplasm of bone: Secondary | ICD-10-CM

## 2016-06-08 DIAGNOSIS — C3492 Malignant neoplasm of unspecified part of left bronchus or lung: Secondary | ICD-10-CM

## 2016-06-08 DIAGNOSIS — Z5111 Encounter for antineoplastic chemotherapy: Secondary | ICD-10-CM | POA: Diagnosis not present

## 2016-06-08 DIAGNOSIS — Z5112 Encounter for antineoplastic immunotherapy: Secondary | ICD-10-CM | POA: Diagnosis not present

## 2016-06-08 DIAGNOSIS — C3491 Malignant neoplasm of unspecified part of right bronchus or lung: Secondary | ICD-10-CM

## 2016-06-08 DIAGNOSIS — M542 Cervicalgia: Secondary | ICD-10-CM

## 2016-06-08 DIAGNOSIS — C342 Malignant neoplasm of middle lobe, bronchus or lung: Secondary | ICD-10-CM

## 2016-06-08 DIAGNOSIS — M47812 Spondylosis without myelopathy or radiculopathy, cervical region: Secondary | ICD-10-CM

## 2016-06-08 DIAGNOSIS — I10 Essential (primary) hypertension: Secondary | ICD-10-CM

## 2016-06-08 LAB — CMP (CANCER CENTER ONLY)
ALBUMIN: 3.6 g/dL (ref 3.3–5.5)
ALK PHOS: 79 U/L (ref 26–84)
ALT(SGPT): 24 U/L (ref 10–47)
AST: 22 U/L (ref 11–38)
BUN, Bld: 11 mg/dL (ref 7–22)
CALCIUM: 9.2 mg/dL (ref 8.0–10.3)
CO2: 24 meq/L (ref 18–33)
Chloride: 104 mEq/L (ref 98–108)
Creat: 0.7 mg/dl (ref 0.6–1.2)
Glucose, Bld: 146 mg/dL — ABNORMAL HIGH (ref 73–118)
Potassium: 3.8 mEq/L (ref 3.3–4.7)
Sodium: 135 mEq/L (ref 128–145)
Total Bilirubin: 0.5 mg/dl (ref 0.20–1.60)
Total Protein: 7.1 g/dL (ref 6.4–8.1)

## 2016-06-08 LAB — CBC WITH DIFFERENTIAL (CANCER CENTER ONLY)
BASO#: 0.1 10*3/uL (ref 0.0–0.2)
BASO%: 1.2 % (ref 0.0–2.0)
EOS ABS: 0.1 10*3/uL (ref 0.0–0.5)
EOS%: 1.7 % (ref 0.0–7.0)
HEMATOCRIT: 36.5 % (ref 34.8–46.6)
HEMOGLOBIN: 11.8 g/dL (ref 11.6–15.9)
LYMPH#: 1.8 10*3/uL (ref 0.9–3.3)
LYMPH%: 31.4 % (ref 14.0–48.0)
MCH: 28.4 pg (ref 26.0–34.0)
MCHC: 32.3 g/dL (ref 32.0–36.0)
MCV: 88 fL (ref 81–101)
MONO#: 0.6 10*3/uL (ref 0.1–0.9)
MONO%: 9.5 % (ref 0.0–13.0)
NEUT%: 56.2 % (ref 39.6–80.0)
NEUTROS ABS: 3.3 10*3/uL (ref 1.5–6.5)
Platelets: 222 10*3/uL (ref 145–400)
RBC: 4.15 10*6/uL (ref 3.70–5.32)
RDW: 18.8 % — ABNORMAL HIGH (ref 11.1–15.7)
WBC: 5.8 10*3/uL (ref 3.9–10.0)

## 2016-06-08 MED ORDER — PROCHLORPERAZINE MALEATE 10 MG PO TABS
ORAL_TABLET | ORAL | Status: AC
  Filled 2016-06-08: qty 1

## 2016-06-08 MED ORDER — SODIUM CHLORIDE 0.9% FLUSH
10.0000 mL | INTRAVENOUS | Status: DC | PRN
  Administered 2016-06-08: 10 mL
  Filled 2016-06-08: qty 10

## 2016-06-08 MED ORDER — HEPARIN SOD (PORK) LOCK FLUSH 100 UNIT/ML IV SOLN
500.0000 [IU] | Freq: Once | INTRAVENOUS | Status: AC | PRN
  Administered 2016-06-08: 500 [IU]
  Filled 2016-06-08: qty 5

## 2016-06-08 MED ORDER — KETOROLAC TROMETHAMINE 15 MG/ML IJ SOLN
30.0000 mg | Freq: Once | INTRAMUSCULAR | Status: AC
  Administered 2016-06-08: 30 mg via INTRAVENOUS

## 2016-06-08 MED ORDER — DENOSUMAB 120 MG/1.7ML ~~LOC~~ SOLN
120.0000 mg | Freq: Once | SUBCUTANEOUS | Status: AC
  Administered 2016-06-08: 120 mg via SUBCUTANEOUS
  Filled 2016-06-08: qty 1.7

## 2016-06-08 MED ORDER — SODIUM CHLORIDE 0.9 % IV SOLN
Freq: Once | INTRAVENOUS | Status: AC
  Administered 2016-06-08: 13:00:00 via INTRAVENOUS

## 2016-06-08 MED ORDER — PROCHLORPERAZINE MALEATE 10 MG PO TABS
10.0000 mg | ORAL_TABLET | Freq: Once | ORAL | Status: AC
  Administered 2016-06-08: 10 mg via ORAL

## 2016-06-08 MED ORDER — PACLITAXEL PROTEIN-BOUND CHEMO INJECTION 100 MG
81.0000 mg/m2 | Freq: Once | INTRAVENOUS | Status: AC
  Administered 2016-06-08: 125 mg via INTRAVENOUS
  Filled 2016-06-08: qty 25

## 2016-06-08 MED ORDER — BEVACIZUMAB CHEMO INJECTION 400 MG/16ML
10.5000 mg/kg | Freq: Once | INTRAVENOUS | Status: AC
  Administered 2016-06-08: 600 mg via INTRAVENOUS
  Filled 2016-06-08: qty 16

## 2016-06-08 NOTE — Patient Instructions (Signed)
Denosumab injection What is this medicine? DENOSUMAB (den oh sue mab) slows bone breakdown. Prolia is used to treat osteoporosis in women after menopause and in men. Delton See is used to treat a high calcium level due to cancer and to prevent bone fractures and other bone problems caused by multiple myeloma or cancer bone metastases. Delton See is also used to treat giant cell tumor of the bone. This medicine may be used for other purposes; ask your health care provider or pharmacist if you have questions. COMMON BRAND NAME(S): Prolia, XGEVA What should I tell my health care provider before I take this medicine? They need to know if you have any of these conditions: -dental disease -having surgery or tooth extraction -infection -kidney disease -low levels of calcium or Vitamin D in the blood -malnutrition -on hemodialysis -skin conditions or sensitivity -thyroid or parathyroid disease -an unusual reaction to denosumab, other medicines, foods, dyes, or preservatives -pregnant or trying to get pregnant -breast-feeding How should I use this medicine? This medicine is for injection under the skin. It is given by a health care professional in a hospital or clinic setting. If you are getting Prolia, a special MedGuide will be given to you by the pharmacist with each prescription and refill. Be sure to read this information carefully each time. For Prolia, talk to your pediatrician regarding the use of this medicine in children. Special care may be needed. For Delton See, talk to your pediatrician regarding the use of this medicine in children. While this drug may be prescribed for children as young as 13 years for selected conditions, precautions do apply. Overdosage: If you think you have taken too much of this medicine contact a poison control center or emergency room at once. NOTE: This medicine is only for you. Do not share this medicine with others. What if I miss a dose? It is important not to miss your  dose. Call your doctor or health care professional if you are unable to keep an appointment. What may interact with this medicine? Do not take this medicine with any of the following medications: -other medicines containing denosumab This medicine may also interact with the following medications: -medicines that lower your chance of fighting infection -steroid medicines like prednisone or cortisone This list may not describe all possible interactions. Give your health care provider a list of all the medicines, herbs, non-prescription drugs, or dietary supplements you use. Also tell them if you smoke, drink alcohol, or use illegal drugs. Some items may interact with your medicine. What should I watch for while using this medicine? Visit your doctor or health care professional for regular checks on your progress. Your doctor or health care professional may order blood tests and other tests to see how you are doing. Call your doctor or health care professional for advice if you get a fever, chills or sore throat, or other symptoms of a cold or flu. Do not treat yourself. This drug may decrease your body's ability to fight infection. Try to avoid being around people who are sick. You should make sure you get enough calcium and vitamin D while you are taking this medicine, unless your doctor tells you not to. Discuss the foods you eat and the vitamins you take with your health care professional. See your dentist regularly. Brush and floss your teeth as directed. Before you have any dental work done, tell your dentist you are receiving this medicine. Do not become pregnant while taking this medicine or for 5 months after stopping  it. Talk with your doctor or health care professional about your birth control options while taking this medicine. Women should inform their doctor if they wish to become pregnant or think they might be pregnant. There is a potential for serious side effects to an unborn child. Talk  to your health care professional or pharmacist for more information. What side effects may I notice from receiving this medicine? Side effects that you should report to your doctor or health care professional as soon as possible: -allergic reactions like skin rash, itching or hives, swelling of the face, lips, or tongue -bone pain -breathing problems -dizziness -jaw pain, especially after dental work -redness, blistering, peeling of the skin -signs and symptoms of infection like fever or chills; cough; sore throat; pain or trouble passing urine -signs of low calcium like fast heartbeat, muscle cramps or muscle pain; pain, tingling, numbness in the hands or feet; seizures -unusual bleeding or bruising -unusually weak or tired Side effects that usually do not require medical attention (report to your doctor or health care professional if they continue or are bothersome): -constipation -diarrhea -headache -joint pain -loss of appetite -muscle pain -runny nose -tiredness -upset stomach This list may not describe all possible side effects. Call your doctor for medical advice about side effects. You may report side effects to FDA at 1-800-FDA-1088. Where should I keep my medicine? This medicine is only given in a clinic, doctor's office, or other health care setting and will not be stored at home. NOTE: This sheet is a summary. It may not cover all possible information. If you have questions about this medicine, talk to your doctor, pharmacist, or health care provider.  2018 Elsevier/Gold Standard (2016-02-16 19:17:21) [facility] Discharge Instructions for Patients Receiving Chemotherapy  Today you received the following chemotherapy agents Abraxane and Avastin.  To help prevent nausea and vomiting after your treatment, we encourage you to take your nausea medication as directed.   If you develop nausea and vomiting that is not controlled by your nausea medication, call  the clinic.   BELOW ARE SYMPTOMS THAT SHOULD BE REPORTED IMMEDIATELY:  *FEVER GREATER THAN 100.5 F  *CHILLS WITH OR WITHOUT FEVER  NAUSEA AND VOMITING THAT IS NOT CONTROLLED WITH YOUR NAUSEA MEDICATION  *UNUSUAL SHORTNESS OF BREATH  *UNUSUAL BRUISING OR BLEEDING  TENDERNESS IN MOUTH AND THROAT WITH OR WITHOUT PRESENCE OF ULCERS  *URINARY PROBLEMS  *BOWEL PROBLEMS  UNUSUAL RASH Items with * indicate a potential emergency and should be followed up as soon as possible.  Feel free to call the clinic you have any questions or concerns. The clinic phone number is [phone].  Please show the [place] at check-in to the Emergency Department and triage nurse.

## 2016-06-08 NOTE — Patient Instructions (Signed)
Implanted Port Home Guide An implanted port is a type of central line that is placed under the skin. Central lines are used to provide IV access when treatment or nutrition needs to be given through a person's veins. Implanted ports are used for long-term IV access. An implanted port may be placed because:  You need IV medicine that would be irritating to the small veins in your hands or arms.  You need long-term IV medicines, such as antibiotics.  You need IV nutrition for a long period.  You need frequent blood draws for lab tests.  You need dialysis.  Implanted ports are usually placed in the chest area, but they can also be placed in the upper arm, the abdomen, or the leg. An implanted port has two main parts:  Reservoir. The reservoir is round and will appear as a small, raised area under your skin. The reservoir is the part where a needle is inserted to give medicines or draw blood.  Catheter. The catheter is a thin, flexible tube that extends from the reservoir. The catheter is placed into a large vein. Medicine that is inserted into the reservoir goes into the catheter and then into the vein.  How will I care for my incision site? Do not get the incision site wet. Bathe or shower as directed by your health care provider. How is my port accessed? Special steps must be taken to access the port:  Before the port is accessed, a numbing cream can be placed on the skin. This helps numb the skin over the port site.  Your health care provider uses a sterile technique to access the port. ? Your health care provider must put on a mask and sterile gloves. ? The skin over your port is cleaned carefully with an antiseptic and allowed to dry. ? The port is gently pinched between sterile gloves, and a needle is inserted into the port.  Only "non-coring" port needles should be used to access the port. Once the port is accessed, a blood return should be checked. This helps ensure that the port  is in the vein and is not clogged.  If your port needs to remain accessed for a constant infusion, a clear (transparent) bandage will be placed over the needle site. The bandage and needle will need to be changed every week, or as directed by your health care provider.  Keep the bandage covering the needle clean and dry. Do not get it wet. Follow your health care provider's instructions on how to take a shower or bath while the port is accessed.  If your port does not need to stay accessed, no bandage is needed over the port.  What is flushing? Flushing helps keep the port from getting clogged. Follow your health care provider's instructions on how and when to flush the port. Ports are usually flushed with saline solution or a medicine called heparin. The need for flushing will depend on how the port is used.  If the port is used for intermittent medicines or blood draws, the port will need to be flushed: ? After medicines have been given. ? After blood has been drawn. ? As part of routine maintenance.  If a constant infusion is running, the port may not need to be flushed.  How long will my port stay implanted? The port can stay in for as long as your health care provider thinks it is needed. When it is time for the port to come out, surgery will be   done to remove it. The procedure is similar to the one performed when the port was put in. When should I seek immediate medical care? When you have an implanted port, you should seek immediate medical care if:  You notice a bad smell coming from the incision site.  You have swelling, redness, or drainage at the incision site.  You have more swelling or pain at the port site or the surrounding area.  You have a fever that is not controlled with medicine.  This information is not intended to replace advice given to you by your health care provider. Make sure you discuss any questions you have with your health care provider. Document  Released: 01/24/2005 Document Revised: 07/02/2015 Document Reviewed: 10/01/2012 Elsevier Interactive Patient Education  2017 [employer]  

## 2016-06-08 NOTE — Progress Notes (Signed)
In a pool This is a  Hematology and Oncology Follow Up Visit  [name] [id number] Sep 29, 1950 66 y.o. 06/08/2016   Principle Diagnosis:  Metastatic adenocarcinoma the lung - bone metastases, lymph node metastasis and intrapulmonary metastasis Malignant left pleural effusion  Current Therapy:  AbraxaneAvastin -s/p cycle #19 Navelbine/Avastin q 2wk dosing - s/p c#4 - progression  Tecentriq s/p cycle 4 Xgeva 120 mg subcutaneous every month Palliative radiation therapy to the neck.    Interim History:  [name] is here today for follow-up. She is mostly complaining of pain in the left side of her neck with radiation to her left shoulder and upper arm. She has had problems with this before. She has seen a pain specialist. She has had injections. I suspect she probably will have to go back for another epidural injection. Possibly, she may need to have ablation of the sensory nerve.  We will order another MRI of the cervical spine and see what that shows.  She also has had a couple episodes of near syncope. I think that she has vasovagal issues.  She does state some occasional swelling in her legs. I'll see much today. Again, I suspect this probably is from her chemotherapy.   Her appetite has been okay. She's had no nausea or vomiting.  She's had no rashes. She's had no change in bowel or bladder habits.  Overall, her performance status is ECOG 1.  Medications:  Allergies as of 06/08/2016      Reactions   Demerol Other (See Comments)   Syncope Passed out   Topamax Other (See Comments)   Had trouble walking   Ciprofloxacin Other (See Comments)   UNSPECIFIED REACTION    Avelox [moxifloxacin Hcl In Nacl] Other (See Comments)   Hypotension.   Mefoxin [cefoxitin Sodium In Dextrose] Rash      Medication List       Accurate as of 06/08/16 11:58 AM. Always use your most recent med list.          amLODipine-benazepril 5-10 MG capsule Commonly known as:  LOTREL TAKE ONE  CAPSULE BY MOUTH EVERY DAY   budesonide-formoterol 160-4.5 MCG/ACT inhaler Commonly known as:  SYMBICORT Inhale 2 puffs into the lungs 2 (two) times daily as needed (SOB, wheezing).   cyclobenzaprine 5 MG tablet Commonly known as:  FLEXERIL Take 1 tablet (5 mg total) by mouth 3 (three) times daily as needed for muscle spasms.   diphenhydramine-acetaminophen 25-500 MG Tabs tablet Commonly known as:  TYLENOL PM Take 1 tablet by mouth at bedtime as needed (sleep).   HYDROcodone-homatropine 5-1.5 MG/5ML syrup Commonly known as:  HYCODAN Take 5 mLs by mouth every 6 (six) hours as needed for cough.   KLOR-CON M20 20 MEQ tablet Generic drug:  potassium chloride SA TAKE 1 TABLET BY MOUTH DAILY   lactulose 10 GM/15ML solution Commonly known as:  CHRONULAC Take 15 mLs (10 g total) by mouth daily. Reported on 07/22/2015   lidocaine-prilocaine cream Commonly known as:  EMLA Apply 1 application topically daily as needed (pain).   loratadine 10 MG tablet Commonly known as:  CLARITIN Take 10 mg by mouth daily as needed for allergies.   meloxicam 7.5 MG tablet Commonly known as:  MOBIC Take 1 tablet (7.5 mg total) by mouth as needed.   omeprazole 40 MG capsule Commonly known as:  PRILOSEC Take 40 mg by mouth daily as needed (heartburn).   oxyCODONE 10 mg 12 hr tablet Commonly known as:  OXYCONTIN Take 1  tablet (10 mg total) by mouth every 12 (twelve) hours.   OxyCODONE HCl (Abuse Deter) 5 MG Taba Commonly known as:  OXAYDO Take 5 mg by mouth every 4 (four) hours as needed (pain).   polyethylene glycol powder powder Commonly known as:  GLYCOLAX/MIRALAX Take 17 g by mouth once. Dissolve 1 capful of powder into any liquid and take once daily. Can increase to 2 times daily if no effect after 3-4 days.   PROAIR RESPICLICK 643 (90 Base) MCG/ACT Aepb Generic drug:  Albuterol Sulfate Inhale 2 puffs into the lungs twice daily as needed for shortness of breath or wheezing     prochlorperazine 10 MG tablet Commonly known as:  COMPAZINE Take 10 mg by mouth every 6 (six) hours as needed for nausea or vomiting.   senna-docusate 8.6-50 MG tablet Commonly known as:  Senokot-S Take 1 tablet by mouth daily.   SUMAtriptan 100 MG tablet Commonly known as:  IMITREX Take 100 mg by mouth as needed for migraine or headache. Reported on 04/01/2015   vitamin B-12 1000 MCG tablet Commonly known as:  CYANOCOBALAMIN Take 1,000 mcg by mouth every morning.       Allergies:  Allergies  Allergen Reactions  . Demerol Other (See Comments)    Syncope Passed out  . Topamax Other (See Comments)    Had trouble walking  . Ciprofloxacin Other (See Comments)    UNSPECIFIED REACTION   . Avelox [Moxifloxacin Hcl In Nacl] Other (See Comments)    Hypotension.  . Mefoxin [Cefoxitin Sodium In Dextrose] Rash    Past Medical History, Surgical history, Social history, and Family History were reviewed and updated.  Review of Systems: All other 10 point review of systems is negative.   Physical Exam:  vitals were not taken for this visit.  Wt Readings from Last 3 Encounters:  06/08/16 135 lb (61.2 kg)  05/11/16 131 lb 1 oz (59.4 kg)  04/14/16 135 lb (61.2 kg)    Well-developed and well-nourished white female in no obvious distress. Head and neck exam shows no ocular or oral lesions. There are no palpable cervical or supraclavicular lymph nodes. Lungs are clear on the right side. There is still some slight decrease breath sounds on the left side. Cardiac exam regular rate and rhythm with no murmurs, rubs or bruits. Abdomen is soft. She is good bowel sounds. There is no fluid wave. There is no palpable liver or spleen tip. Back exam shows no tenderness over the spine, ribs or hips. Extremities shows no clubbing, cyanosis or edema. Skin exam shows no rashes, ecchymoses or petechia. Neurological exam shows no focal neurological deficits.   Lab Results  Component Value Date   WBC  6.1 05/25/2016   HGB 11.2 (L) 05/25/2016   HCT 34.8 05/25/2016   MCV 88 05/25/2016   PLT 234 05/25/2016   Lab Results  Component Value Date   FERRITIN 165 05/11/2016   IRON 57 05/11/2016   TIBC 264 05/11/2016   UIBC 207 05/11/2016   IRONPCTSAT 21 05/11/2016   Lab Results  Component Value Date   RETICCTPCT 1.4 08/13/2008   RBC 3.94 05/25/2016   RETICCTABS 61.6 08/13/2008   No results found for: KPAFRELGTCHN, LAMBDASER, KAPLAMBRATIO No results found for: IGGSERUM, IGA, IGMSERUM No results found for: Ronnald Ramp, A1GS, A2GS, Violet Baldy, MSPIKE, SPEI   Chemistry      Component Value Date/Time   NA 136 05/25/2016 1336   NA 140 07/22/2015 1255   K 4.2 05/25/2016 1336  K 4.0 07/22/2015 1255   CL 100 05/25/2016 1336   CO2 26 05/25/2016 1336   CO2 27 07/22/2015 1255   BUN 15 05/25/2016 1336   BUN 9.8 07/22/2015 1255   CREATININE 0.8 05/25/2016 1336   CREATININE 0.6 07/22/2015 1255      Component Value Date/Time   CALCIUM 8.7 05/25/2016 1336   CALCIUM 9.1 07/22/2015 1255   ALKPHOS 81 05/25/2016 1336   AST 20 05/25/2016 1336   ALT 16 05/25/2016 1336   BILITOT 0.50 05/25/2016 1336     Impression and Plan: Ms. Strada is a pleasant 66 yo white female with metastatic adenocarcinoma of the lung. She has no "Driver" mutations that we can target.  I realize that she does have metastatic lung cancer. A lot of the pain that she has is osteoarthritic in nature. She does have bony involvement, however, with her lung cancer.  I will give her a dose of Toradol today with her chemotherapy. Hopefully, this will help a little bit.  Her last PET scan was back in February. We will get her set up with another PET scan in late May.   She does have the hoarseness. She has not gone back to see Dr. Redmond Baseman of ENT. She has had one vocal cord injection already. She is not sure she wants another one.  As always, she has a very strong faith. She brought [place] in another signed  for [place] to put in her office. This really helps other patients as they see these signs and really feel inspired.   We will get her back in 2 more weeks.     Volanda Napoleon, MD 5/2/201811:58 AM

## 2016-06-11 ENCOUNTER — Ambulatory Visit (HOSPITAL_BASED_OUTPATIENT_CLINIC_OR_DEPARTMENT_OTHER)
Admission: RE | Admit: 2016-06-11 | Discharge: 2016-06-11 | Disposition: A | Discharge status: Home / Self Care | Payer: PPO | Source: Ambulatory Visit | Attending: Hematology & Oncology | Admitting: Hematology & Oncology

## 2016-06-11 DIAGNOSIS — C3490 Malignant neoplasm of unspecified part of unspecified bronchus or lung: Secondary | ICD-10-CM | POA: Insufficient documentation

## 2016-06-11 DIAGNOSIS — M4802 Spinal stenosis, cervical region: Secondary | ICD-10-CM | POA: Insufficient documentation

## 2016-06-11 DIAGNOSIS — M542 Cervicalgia: Secondary | ICD-10-CM | POA: Diagnosis not present

## 2016-06-11 DIAGNOSIS — R221 Localized swelling, mass and lump, neck: Secondary | ICD-10-CM | POA: Diagnosis not present

## 2016-06-11 DIAGNOSIS — M50223 Other cervical disc displacement at C6-C7 level: Secondary | ICD-10-CM | POA: Diagnosis not present

## 2016-06-11 DIAGNOSIS — C7951 Secondary malignant neoplasm of bone: Secondary | ICD-10-CM | POA: Diagnosis not present

## 2016-06-11 DIAGNOSIS — M47812 Spondylosis without myelopathy or radiculopathy, cervical region: Secondary | ICD-10-CM | POA: Diagnosis not present

## 2016-06-11 MED ORDER — GADOBENATE DIMEGLUMINE 529 MG/ML IV SOLN
15.0000 mL | Freq: Once | INTRAVENOUS | Status: AC | PRN
  Administered 2016-06-11: 12 mL via INTRAVENOUS

## 2016-06-15 DIAGNOSIS — M5412 Radiculopathy, cervical region: Secondary | ICD-10-CM | POA: Diagnosis not present

## 2016-06-22 ENCOUNTER — Other Ambulatory Visit: Payer: PPO

## 2016-06-22 ENCOUNTER — Ambulatory Visit (HOSPITAL_BASED_OUTPATIENT_CLINIC_OR_DEPARTMENT_OTHER): Payer: PPO

## 2016-06-22 ENCOUNTER — Other Ambulatory Visit (HOSPITAL_BASED_OUTPATIENT_CLINIC_OR_DEPARTMENT_OTHER): Payer: PPO

## 2016-06-22 ENCOUNTER — Ambulatory Visit: Payer: PPO

## 2016-06-22 ENCOUNTER — Ambulatory Visit (HOSPITAL_BASED_OUTPATIENT_CLINIC_OR_DEPARTMENT_OTHER): Payer: PPO | Admitting: Family

## 2016-06-22 VITALS — BP 113/81 | HR 98 | Temp 98.0°F | Resp 18 | Wt 135.0 lb

## 2016-06-22 DIAGNOSIS — J910 Malignant pleural effusion: Secondary | ICD-10-CM | POA: Diagnosis not present

## 2016-06-22 DIAGNOSIS — C3490 Malignant neoplasm of unspecified part of unspecified bronchus or lung: Secondary | ICD-10-CM

## 2016-06-22 DIAGNOSIS — N3000 Acute cystitis without hematuria: Secondary | ICD-10-CM

## 2016-06-22 DIAGNOSIS — Z5112 Encounter for antineoplastic immunotherapy: Secondary | ICD-10-CM

## 2016-06-22 DIAGNOSIS — R300 Dysuria: Secondary | ICD-10-CM

## 2016-06-22 DIAGNOSIS — Z5111 Encounter for antineoplastic chemotherapy: Secondary | ICD-10-CM

## 2016-06-22 DIAGNOSIS — C7951 Secondary malignant neoplasm of bone: Secondary | ICD-10-CM

## 2016-06-22 DIAGNOSIS — C3491 Malignant neoplasm of unspecified part of right bronchus or lung: Secondary | ICD-10-CM

## 2016-06-22 DIAGNOSIS — C3492 Malignant neoplasm of unspecified part of left bronchus or lung: Secondary | ICD-10-CM

## 2016-06-22 DIAGNOSIS — M542 Cervicalgia: Secondary | ICD-10-CM

## 2016-06-22 LAB — URINALYSIS, MICROSCOPIC (CHCC SATELLITE)
BILIRUBIN (URINE): NEGATIVE
Blood: NEGATIVE
Glucose: NEGATIVE mg/dL
KETONES: NEGATIVE mg/dL
NITRITE: NEGATIVE
PROTEIN: NEGATIVE mg/dL
Specific Gravity, Urine: 1.015 (ref 1.003–1.035)
Urobilinogen, UR: 0.2 mg/dL (ref 0.2–1)
pH: 6 (ref 4.60–8.00)

## 2016-06-22 LAB — CBC WITH DIFFERENTIAL (CANCER CENTER ONLY)
BASO#: 0.1 10*3/uL (ref 0.0–0.2)
BASO%: 1 % (ref 0.0–2.0)
EOS%: 2.6 % (ref 0.0–7.0)
Eosinophils Absolute: 0.2 10*3/uL (ref 0.0–0.5)
HCT: 37.7 % (ref 34.8–46.6)
HGB: 12.2 g/dL (ref 11.6–15.9)
LYMPH#: 1.9 10*3/uL (ref 0.9–3.3)
LYMPH%: 32.6 % (ref 14.0–48.0)
MCH: 28.4 pg (ref 26.0–34.0)
MCHC: 32.4 g/dL (ref 32.0–36.0)
MCV: 88 fL (ref 81–101)
MONO#: 0.6 10*3/uL (ref 0.1–0.9)
MONO%: 10 % (ref 0.0–13.0)
NEUT%: 53.8 % (ref 39.6–80.0)
NEUTROS ABS: 3.1 10*3/uL (ref 1.5–6.5)
Platelets: 242 10*3/uL (ref 145–400)
RBC: 4.29 10*6/uL (ref 3.70–5.32)
RDW: 18.9 % — ABNORMAL HIGH (ref 11.1–15.7)
WBC: 5.8 10*3/uL (ref 3.9–10.0)

## 2016-06-22 LAB — CMP (CANCER CENTER ONLY)
ALT(SGPT): 23 U/L (ref 10–47)
AST: 23 U/L (ref 11–38)
Albumin: 3.8 g/dL (ref 3.3–5.5)
Alkaline Phosphatase: 78 U/L (ref 26–84)
BILIRUBIN TOTAL: 0.6 mg/dL (ref 0.20–1.60)
BUN, Bld: 11 mg/dL (ref 7–22)
CO2: 25 meq/L (ref 18–33)
CREATININE: 0.7 mg/dL (ref 0.6–1.2)
Calcium: 8.6 mg/dL (ref 8.0–10.3)
Chloride: 103 mEq/L (ref 98–108)
GLUCOSE: 126 mg/dL — AB (ref 73–118)
Potassium: 4.1 mEq/L (ref 3.3–4.7)
SODIUM: 138 meq/L (ref 128–145)
Total Protein: 7.1 g/dL (ref 6.4–8.1)

## 2016-06-22 MED ORDER — PROCHLORPERAZINE MALEATE 10 MG PO TABS
10.0000 mg | ORAL_TABLET | Freq: Once | ORAL | Status: AC
  Administered 2016-06-22: 10 mg via ORAL

## 2016-06-22 MED ORDER — KETOROLAC TROMETHAMINE 30 MG/ML IJ SOLN
30.0000 mg | Freq: Once | INTRAMUSCULAR | Status: AC
  Administered 2016-06-22: 30 mg via INTRAVENOUS
  Filled 2016-06-22: qty 1

## 2016-06-22 MED ORDER — PACLITAXEL PROTEIN-BOUND CHEMO INJECTION 100 MG
81.0000 mg/m2 | Freq: Once | INTRAVENOUS | Status: AC
  Administered 2016-06-22: 125 mg via INTRAVENOUS
  Filled 2016-06-22: qty 25

## 2016-06-22 MED ORDER — BEVACIZUMAB CHEMO INJECTION 400 MG/16ML
10.5000 mg/kg | Freq: Once | INTRAVENOUS | Status: AC
  Administered 2016-06-22: 600 mg via INTRAVENOUS
  Filled 2016-06-22: qty 16

## 2016-06-22 MED ORDER — SODIUM CHLORIDE 0.9% FLUSH
10.0000 mL | INTRAVENOUS | Status: DC | PRN
  Administered 2016-06-22: 10 mL
  Filled 2016-06-22: qty 10

## 2016-06-22 MED ORDER — PROCHLORPERAZINE MALEATE 10 MG PO TABS
ORAL_TABLET | ORAL | Status: AC
  Filled 2016-06-22: qty 1

## 2016-06-22 MED ORDER — KETOROLAC TROMETHAMINE 15 MG/ML IJ SOLN
INTRAMUSCULAR | Status: AC
  Filled 2016-06-22: qty 2

## 2016-06-22 MED ORDER — SODIUM CHLORIDE 0.9 % IV SOLN
Freq: Once | INTRAVENOUS | Status: AC
  Administered 2016-06-22: 11:00:00 via INTRAVENOUS

## 2016-06-22 MED ORDER — HEPARIN SOD (PORK) LOCK FLUSH 100 UNIT/ML IV SOLN
500.0000 [IU] | Freq: Once | INTRAVENOUS | Status: AC | PRN
  Administered 2016-06-22: 500 [IU]
  Filled 2016-06-22: qty 5

## 2016-06-22 NOTE — Patient Instructions (Signed)
[facility] Discharge Instructions for Patients Receiving Chemotherapy  Today you received the following chemotherapy agents Abraxane and Avastin.  To help prevent nausea and vomiting after your treatment, we encourage you to take your nausea medication as directed.   If you develop nausea and vomiting that is not controlled by your nausea medication, call the clinic.   BELOW ARE SYMPTOMS THAT SHOULD BE REPORTED IMMEDIATELY:  *FEVER GREATER THAN 100.5 F  *CHILLS WITH OR WITHOUT FEVER  NAUSEA AND VOMITING THAT IS NOT CONTROLLED WITH YOUR NAUSEA MEDICATION  *UNUSUAL SHORTNESS OF BREATH  *UNUSUAL BRUISING OR BLEEDING  TENDERNESS IN MOUTH AND THROAT WITH OR WITHOUT PRESENCE OF ULCERS  *URINARY PROBLEMS  *BOWEL PROBLEMS  UNUSUAL RASH Items with * indicate a potential emergency and should be followed up as soon as possible.  Feel free to call the clinic you have any questions or concerns. The clinic phone number is [phone].  Please show the [place] at check-in to the Emergency Department and triage nurse.

## 2016-06-22 NOTE — Patient Instructions (Signed)
Implanted Port Home Guide An implanted port is a type of central line that is placed under the skin. Central lines are used to provide IV access when treatment or nutrition needs to be given through a person's veins. Implanted ports are used for long-term IV access. An implanted port may be placed because:  You need IV medicine that would be irritating to the small veins in your hands or arms.  You need long-term IV medicines, such as antibiotics.  You need IV nutrition for a long period.  You need frequent blood draws for lab tests.  You need dialysis.  Implanted ports are usually placed in the chest area, but they can also be placed in the upper arm, the abdomen, or the leg. An implanted port has two main parts:  Reservoir. The reservoir is round and will appear as a small, raised area under your skin. The reservoir is the part where a needle is inserted to give medicines or draw blood.  Catheter. The catheter is a thin, flexible tube that extends from the reservoir. The catheter is placed into a large vein. Medicine that is inserted into the reservoir goes into the catheter and then into the vein.  How will I care for my incision site? Do not get the incision site wet. Bathe or shower as directed by your health care provider. How is my port accessed? Special steps must be taken to access the port:  Before the port is accessed, a numbing cream can be placed on the skin. This helps numb the skin over the port site.  Your health care provider uses a sterile technique to access the port. ? Your health care provider must put on a mask and sterile gloves. ? The skin over your port is cleaned carefully with an antiseptic and allowed to dry. ? The port is gently pinched between sterile gloves, and a needle is inserted into the port.  Only "non-coring" port needles should be used to access the port. Once the port is accessed, a blood return should be checked. This helps ensure that the port  is in the vein and is not clogged.  If your port needs to remain accessed for a constant infusion, a clear (transparent) bandage will be placed over the needle site. The bandage and needle will need to be changed every week, or as directed by your health care provider.  Keep the bandage covering the needle clean and dry. Do not get it wet. Follow your health care provider's instructions on how to take a shower or bath while the port is accessed.  If your port does not need to stay accessed, no bandage is needed over the port.  What is flushing? Flushing helps keep the port from getting clogged. Follow your health care provider's instructions on how and when to flush the port. Ports are usually flushed with saline solution or a medicine called heparin. The need for flushing will depend on how the port is used.  If the port is used for intermittent medicines or blood draws, the port will need to be flushed: ? After medicines have been given. ? After blood has been drawn. ? As part of routine maintenance.  If a constant infusion is running, the port may not need to be flushed.  How long will my port stay implanted? The port can stay in for as long as your health care provider thinks it is needed. When it is time for the port to come out, surgery will be   done to remove it. The procedure is similar to the one performed when the port was put in. When should I seek immediate medical care? When you have an implanted port, you should seek immediate medical care if:  You notice a bad smell coming from the incision site.  You have swelling, redness, or drainage at the incision site.  You have more swelling or pain at the port site or the surrounding area.  You have a fever that is not controlled with medicine.  This information is not intended to replace advice given to you by your health care provider. Make sure you discuss any questions you have with your health care provider. Document  Released: 01/24/2005 Document Revised: 07/02/2015 Document Reviewed: 10/01/2012 Elsevier Interactive Patient Education  2017 [employer]  

## 2016-06-22 NOTE — Progress Notes (Signed)
Hematology and Oncology Follow Up Visit  [name] [id number] Oct 24, 1950 66 y.o. 06/22/2016   Principle Diagnosis:  Metastatic adenocarcinoma the lung - bone metastases, lymph node metastasis and intrapulmonary metastasis Malignant left pleural effusion   Current Therapy:   Abraxane/Avastin - s/p cycle 21 Xgeva 120 mg subcutaneous every month  Past Therapy: Navelbine/Avastin q 2wk dosing - s/p cycle 4 - stopped due to progression  Tecentriq s/p cycle 4 Palliative radiation therapy to the neck   Interim History:  [name] is here today with her husband for follow-up and treatment. She is feeling fatigued and states that she has had some aching in her legs. She also c/o burning on urination several times this week. UA showed trace leukocyte estrace and trace bacteria. Urine was sent for culture and will await these results prior to starting an antibiotic.  She has occasional SOB with over exertion. She has had some sinus congestion and drainage lately and a dry cough. She has not been taking her Claritin regularly and will start taking daily.  She has had no other syncopal episodes and no falls.  No fever, chills, n/v, rash, dizziness, chest pain, palpitations, abdominal pain or changes in bowel habits.  No swelling, numbness or tingling in her extremities at this time. She is still having pain in the neck and left shoulder. She will be following up with Dr. Patrick Jupiter next week for another epidural injection.  Her appetite has improved but she admits to needed to drink more fluids.  Her voice is still hoarse. She has no sore throat and no problem swallowing.   ECOG Performance Status: 1 - Symptomatic but completely ambulatory  Medications:  Allergies as of 06/22/2016      Reactions   Demerol Other (See Comments)   Syncope Passed out   Topamax Other (See Comments)   Had trouble walking   Ciprofloxacin Other (See Comments)   UNSPECIFIED REACTION    Avelox [moxifloxacin Hcl In  Nacl] Other (See Comments)   Hypotension.   Mefoxin [cefoxitin Sodium In Dextrose] Rash      Medication List       Accurate as of 06/22/16 12:46 PM. Always use your most recent med list.          amLODipine-benazepril 5-10 MG capsule Commonly known as:  LOTREL TAKE ONE CAPSULE BY MOUTH EVERY DAY   budesonide-formoterol 160-4.5 MCG/ACT inhaler Commonly known as:  SYMBICORT Inhale 2 puffs into the lungs 2 (two) times daily as needed (SOB, wheezing).   cyclobenzaprine 5 MG tablet Commonly known as:  FLEXERIL Take 1 tablet (5 mg total) by mouth 3 (three) times daily as needed for muscle spasms.   diphenhydramine-acetaminophen 25-500 MG Tabs tablet Commonly known as:  TYLENOL PM Take 1 tablet by mouth at bedtime as needed (sleep).   HYDROcodone-homatropine 5-1.5 MG/5ML syrup Commonly known as:  HYCODAN Take 5 mLs by mouth every 6 (six) hours as needed for cough.   KLOR-CON M20 20 MEQ tablet Generic drug:  potassium chloride SA TAKE 1 TABLET BY MOUTH DAILY   lactulose 10 GM/15ML solution Commonly known as:  CHRONULAC Take 15 mLs (10 g total) by mouth daily. Reported on 07/22/2015   lidocaine-prilocaine cream Commonly known as:  EMLA Apply 1 application topically daily as needed (pain).   loratadine 10 MG tablet Commonly known as:  CLARITIN Take 10 mg by mouth daily as needed for allergies.   meloxicam 7.5 MG tablet Commonly known as:  MOBIC Take 1 tablet (7.5 mg  total) by mouth as needed.   omeprazole 40 MG capsule Commonly known as:  PRILOSEC Take 40 mg by mouth daily as needed (heartburn).   oxyCODONE 10 mg 12 hr tablet Commonly known as:  OXYCONTIN Take 1 tablet (10 mg total) by mouth every 12 (twelve) hours.   OxyCODONE HCl (Abuse Deter) 5 MG Taba Commonly known as:  OXAYDO Take 5 mg by mouth every 4 (four) hours as needed (pain).   polyethylene glycol powder powder Commonly known as:  GLYCOLAX/MIRALAX Take 17 g by mouth once. Dissolve 1 capful of powder  into any liquid and take once daily. Can increase to 2 times daily if no effect after 3-4 days.   PROAIR RESPICLICK 361 (90 Base) MCG/ACT Aepb Generic drug:  Albuterol Sulfate Inhale 2 puffs into the lungs twice daily as needed for shortness of breath or wheezing   prochlorperazine 10 MG tablet Commonly known as:  COMPAZINE Take 10 mg by mouth every 6 (six) hours as needed for nausea or vomiting.   senna-docusate 8.6-50 MG tablet Commonly known as:  Senokot-S Take 1 tablet by mouth daily.   SUMAtriptan 100 MG tablet Commonly known as:  IMITREX Take 100 mg by mouth as needed for migraine or headache. Reported on 04/01/2015   vitamin B-12 1000 MCG tablet Commonly known as:  CYANOCOBALAMIN Take 1,000 mcg by mouth every morning.       Allergies:  Allergies  Allergen Reactions  . Demerol Other (See Comments)    Syncope Passed out  . Topamax Other (See Comments)    Had trouble walking  . Ciprofloxacin Other (See Comments)    UNSPECIFIED REACTION   . Avelox [Moxifloxacin Hcl In Nacl] Other (See Comments)    Hypotension.  . Mefoxin [Cefoxitin Sodium In Dextrose] Rash    Past Medical History, Surgical history, Social history, and Family History were reviewed and updated.  Review of Systems: All other 10 point review of systems is negative.   Physical Exam:  weight is 135 lb (61.2 kg). Her oral temperature is 98 F (36.7 C). Her blood pressure is 113/81 and her pulse is 98. Her respiration is 18 and oxygen saturation is 99%.   Wt Readings from Last 3 Encounters:  06/22/16 135 lb (61.2 kg)  06/08/16 135 lb (61.2 kg)  05/11/16 131 lb 1 oz (59.4 kg)    Ocular: Sclerae unicteric, pupils equal, round and reactive to light Ear-nose-throat: Oropharynx clear, dentition fair Lymphatic: No cervical, supraclavicular or axillary adenopathy Lungs no rales or rhonchi, good excursion bilaterally Heart regular rate and rhythm, no murmur appreciated Abd soft, nontender, positive bowel  sounds, no liver or spleen tip palpated on exam, no fluid wave MSK no focal spinal tenderness, no joint edema Neuro: non-focal, well-oriented, appropriate affect Breasts: Deferred  Lab Results  Component Value Date   WBC 5.8 06/22/2016   HGB 12.2 06/22/2016   HCT 37.7 06/22/2016   MCV 88 06/22/2016   PLT 242 06/22/2016   Lab Results  Component Value Date   FERRITIN 165 05/11/2016   IRON 57 05/11/2016   TIBC 264 05/11/2016   UIBC 207 05/11/2016   IRONPCTSAT 21 05/11/2016   Lab Results  Component Value Date   RETICCTPCT 1.4 08/13/2008   RBC 4.29 06/22/2016   RETICCTABS 61.6 08/13/2008   No results found for: KPAFRELGTCHN, LAMBDASER, KAPLAMBRATIO No results found for: IGGSERUM, IGA, IGMSERUM No results found for: TOTALPROTELP, ALBUMINELP, A1GS, A2GS, BETS, BETA2SER, [place], [place], SPEI   Chemistry  Component Value Date/Time   NA 138 06/22/2016 1031   NA 140 07/22/2015 1255   K 4.1 06/22/2016 1031   K 4.0 07/22/2015 1255   CL 103 06/22/2016 1031   CO2 25 06/22/2016 1031   CO2 27 07/22/2015 1255   BUN 11 06/22/2016 1031   BUN 9.8 07/22/2015 1255   CREATININE 0.7 06/22/2016 1031   CREATININE 0.6 07/22/2015 1255      Component Value Date/Time   CALCIUM 8.6 06/22/2016 1031   CALCIUM 9.1 07/22/2015 1255   ALKPHOS 78 06/22/2016 1031   AST 23 06/22/2016 1031   ALT 23 06/22/2016 1031   BILITOT 0.60 06/22/2016 1031      Impression and Plan: [name] is a very pleasant 67 yo caucasian female with metastatic adenocarcinoma of the lung. She is tolerating treatment fairly well. She still has some fatigued and appears to be developing a slight UTI. Urine was sent for culture and we will see what this grows.  We will proceed with treatment today as planned. She will increase her fluid intake.  She follows up next week with Dr. Patrick Jupiter for another epidural injection.  We will plan to see her back in 2 weeks for repeat lab work and follow-up. We will repeat a PET scan in 2  weeks prior to her next treatment.  She is in agreement with the plan. They will contact our office with any questions or concerns. We can certainly see her sooner if need be.   Eliezer Bottom, NP 5/16/201812:46 PM

## 2016-06-24 LAB — URINE CULTURE

## 2016-06-28 ENCOUNTER — Telehealth: Payer: Self-pay | Admitting: *Deleted

## 2016-06-28 DIAGNOSIS — M5412 Radiculopathy, cervical region: Secondary | ICD-10-CM | POA: Diagnosis not present

## 2016-06-28 NOTE — Telephone Encounter (Addendum)
Patient is aware of results  ----- Message from Eliezer Bottom, NP sent at 06/27/2016  9:12 PM EDT ----- Regarding: Urine cx Urine culture did not grow anything. No antibiotic needed at this time per Dr. Marin Olp. Thank you!  Sarah  ----- Message ----- From: Interface, Lab In Three Zero One Sent: 06/22/2016  11:57 AM To: Eliezer Bottom, NP

## 2016-07-05 ENCOUNTER — Encounter (HOSPITAL_COMMUNITY)
Admission: RE | Admit: 2016-07-05 | Discharge: 2016-07-05 | Disposition: A | Discharge status: Home / Self Care | Payer: PPO | Source: Ambulatory Visit | Attending: Family | Admitting: Family

## 2016-07-05 DIAGNOSIS — R300 Dysuria: Secondary | ICD-10-CM | POA: Diagnosis not present

## 2016-07-05 DIAGNOSIS — N3000 Acute cystitis without hematuria: Secondary | ICD-10-CM | POA: Insufficient documentation

## 2016-07-05 DIAGNOSIS — C7951 Secondary malignant neoplasm of bone: Secondary | ICD-10-CM | POA: Diagnosis not present

## 2016-07-05 DIAGNOSIS — R911 Solitary pulmonary nodule: Secondary | ICD-10-CM | POA: Diagnosis not present

## 2016-07-05 DIAGNOSIS — C3490 Malignant neoplasm of unspecified part of unspecified bronchus or lung: Secondary | ICD-10-CM | POA: Diagnosis not present

## 2016-07-05 LAB — GLUCOSE, CAPILLARY: GLUCOSE-CAPILLARY: 88 mg/dL (ref 65–99)

## 2016-07-05 MED ORDER — FLUDEOXYGLUCOSE F - 18 (FDG) INJECTION
6.7300 | Freq: Once | INTRAVENOUS | Status: AC | PRN
  Administered 2016-07-05: 6.73 via INTRAVENOUS

## 2016-07-06 ENCOUNTER — Ambulatory Visit: Payer: PPO

## 2016-07-06 ENCOUNTER — Ambulatory Visit (HOSPITAL_BASED_OUTPATIENT_CLINIC_OR_DEPARTMENT_OTHER): Payer: PPO | Admitting: Family

## 2016-07-06 ENCOUNTER — Ambulatory Visit (HOSPITAL_BASED_OUTPATIENT_CLINIC_OR_DEPARTMENT_OTHER): Payer: PPO

## 2016-07-06 ENCOUNTER — Other Ambulatory Visit: Payer: Self-pay | Admitting: *Deleted

## 2016-07-06 ENCOUNTER — Other Ambulatory Visit (HOSPITAL_BASED_OUTPATIENT_CLINIC_OR_DEPARTMENT_OTHER): Payer: PPO

## 2016-07-06 VITALS — BP 116/75 | HR 88 | Temp 97.7°F | Resp 20 | Wt 133.0 lb

## 2016-07-06 DIAGNOSIS — Z5111 Encounter for antineoplastic chemotherapy: Secondary | ICD-10-CM

## 2016-07-06 DIAGNOSIS — C7951 Secondary malignant neoplasm of bone: Secondary | ICD-10-CM

## 2016-07-06 DIAGNOSIS — C3490 Malignant neoplasm of unspecified part of unspecified bronchus or lung: Secondary | ICD-10-CM

## 2016-07-06 DIAGNOSIS — R300 Dysuria: Secondary | ICD-10-CM

## 2016-07-06 DIAGNOSIS — J910 Malignant pleural effusion: Secondary | ICD-10-CM

## 2016-07-06 DIAGNOSIS — C3491 Malignant neoplasm of unspecified part of right bronchus or lung: Secondary | ICD-10-CM

## 2016-07-06 DIAGNOSIS — Z5112 Encounter for antineoplastic immunotherapy: Secondary | ICD-10-CM

## 2016-07-06 DIAGNOSIS — C342 Malignant neoplasm of middle lobe, bronchus or lung: Secondary | ICD-10-CM

## 2016-07-06 DIAGNOSIS — C3492 Malignant neoplasm of unspecified part of left bronchus or lung: Secondary | ICD-10-CM

## 2016-07-06 DIAGNOSIS — N3000 Acute cystitis without hematuria: Secondary | ICD-10-CM

## 2016-07-06 LAB — CMP (CANCER CENTER ONLY)
ALK PHOS: 72 U/L (ref 26–84)
ALT: 20 U/L (ref 10–47)
AST: 19 U/L (ref 11–38)
Albumin: 3.7 g/dL (ref 3.3–5.5)
BUN, Bld: 9 mg/dL (ref 7–22)
CALCIUM: 8.6 mg/dL (ref 8.0–10.3)
CHLORIDE: 101 meq/L (ref 98–108)
CO2: 27 mEq/L (ref 18–33)
Creat: 0.4 mg/dl — ABNORMAL LOW (ref 0.6–1.2)
Glucose, Bld: 85 mg/dL (ref 73–118)
POTASSIUM: 3.9 meq/L (ref 3.3–4.7)
Sodium: 136 mEq/L (ref 128–145)
TOTAL PROTEIN: 6.9 g/dL (ref 6.4–8.1)
Total Bilirubin: 0.6 mg/dl (ref 0.20–1.60)

## 2016-07-06 LAB — CBC WITH DIFFERENTIAL (CANCER CENTER ONLY)
BASO#: 0.1 10*3/uL (ref 0.0–0.2)
BASO%: 1 % (ref 0.0–2.0)
EOS ABS: 0.1 10*3/uL (ref 0.0–0.5)
EOS%: 1.6 % (ref 0.0–7.0)
HEMATOCRIT: 38.1 % (ref 34.8–46.6)
HGB: 12.4 g/dL (ref 11.6–15.9)
LYMPH#: 2.1 10*3/uL (ref 0.9–3.3)
LYMPH%: 26.2 % (ref 14.0–48.0)
MCH: 28.7 pg (ref 26.0–34.0)
MCHC: 32.5 g/dL (ref 32.0–36.0)
MCV: 88 fL (ref 81–101)
MONO#: 1 10*3/uL — AB (ref 0.1–0.9)
MONO%: 12.8 % (ref 0.0–13.0)
NEUT#: 4.6 10*3/uL (ref 1.5–6.5)
NEUT%: 58.4 % (ref 39.6–80.0)
PLATELETS: 225 10*3/uL (ref 145–400)
RBC: 4.32 10*6/uL (ref 3.70–5.32)
RDW: 19.3 % — ABNORMAL HIGH (ref 11.1–15.7)
WBC: 7.9 10*3/uL (ref 3.9–10.0)

## 2016-07-06 LAB — LACTATE DEHYDROGENASE: LDH: 193 U/L (ref 125–245)

## 2016-07-06 MED ORDER — HEPARIN SOD (PORK) LOCK FLUSH 100 UNIT/ML IV SOLN
500.0000 [IU] | Freq: Once | INTRAVENOUS | Status: AC | PRN
  Administered 2016-07-06: 500 [IU]
  Filled 2016-07-06: qty 5

## 2016-07-06 MED ORDER — PROCHLORPERAZINE MALEATE 10 MG PO TABS
ORAL_TABLET | ORAL | Status: AC
  Filled 2016-07-06: qty 1

## 2016-07-06 MED ORDER — PROCHLORPERAZINE MALEATE 10 MG PO TABS
10.0000 mg | ORAL_TABLET | Freq: Once | ORAL | Status: AC
  Administered 2016-07-06: 10 mg via ORAL

## 2016-07-06 MED ORDER — SODIUM CHLORIDE 0.9% FLUSH
10.0000 mL | INTRAVENOUS | Status: DC | PRN
  Administered 2016-07-06: 10 mL
  Filled 2016-07-06: qty 10

## 2016-07-06 MED ORDER — FENTANYL 25 MCG/HR TD PT72: 25.0000 ug | MEDICATED_PATCH | TRANSDERMAL | 0 refills | Status: DC

## 2016-07-06 MED ORDER — PACLITAXEL PROTEIN-BOUND CHEMO INJECTION 100 MG
81.0000 mg/m2 | Freq: Once | INTRAVENOUS | Status: AC
  Administered 2016-07-06: 125 mg via INTRAVENOUS
  Filled 2016-07-06: qty 25

## 2016-07-06 MED ORDER — DENOSUMAB 120 MG/1.7ML ~~LOC~~ SOLN
120.0000 mg | Freq: Once | SUBCUTANEOUS | Status: AC
  Administered 2016-07-06: 120 mg via SUBCUTANEOUS
  Filled 2016-07-06: qty 1.7

## 2016-07-06 MED ORDER — SODIUM CHLORIDE 0.9 % IV SOLN
10.5000 mg/kg | Freq: Once | INTRAVENOUS | Status: AC
  Administered 2016-07-06: 600 mg via INTRAVENOUS
  Filled 2016-07-06: qty 16

## 2016-07-06 MED ORDER — SODIUM CHLORIDE 0.9 % IV SOLN
Freq: Once | INTRAVENOUS | Status: AC
  Administered 2016-07-06: 11:00:00 via INTRAVENOUS

## 2016-07-06 NOTE — Progress Notes (Signed)
Hematology and Oncology Follow Up Visit  [name] [id number] 07/06/1950 66 y.o. 07/06/2016   Principle Diagnosis:  Metastatic adenocarcinoma the lung - bone metastases, lymph node metastasis and intrapulmonary metastasis Malignant left pleural effusion   Current Therapy:   Abraxane/Avastin - s/p cycle 21 Xgeva 120 mg subcutaneous every month  Past Therapy: Navelbine/Avastin q 2wk dosing - s/p cycle 4 - stopped due to progression  Tecentriq s/p cycle 4 Palliative radiation therapy to the neck   Interim History:  Ms. Bayles is here today with her husband for follow-up and treatment. She went for her PET scan yesterday which showed generally improved metabolic acitivity in the involved lymph nodes, pulmonary nodules and bony metastatic disease in the neck, chest, abdomen and pelvis.  She had another epidural injection with Dr. Patrick Jupiter last week. She feels that this helped until yesterday during her scan when she had to lift her left arm and lay flat. Her right ribs and left shoulder are tender. She is not getting much relief from the lidocaine patch. She also has some discomfort in her left leg hip, thigh and lower back. Her current pain medication is not always effective.  No fever, chills, n/v, cough, rash, SOB, chest pain, palpitations, abdominal pain or changes in bowel or bladder habits.  She has occasional dizziness when getting up too quick. She denies any falls or syncopal episodes.  She has occasional puffiness in her feet and ankles that comes and goes. No numbness or tingling in her extremities at this time.  She has maintained a good appetite and is staying well hydrated. Her weight is stable.   ECOG Performance Status: 1 - Symptomatic but completely ambulatory  Medications:  Allergies as of 07/06/2016      Reactions   Demerol Other (See Comments)   Syncope Passed out   Topamax Other (See Comments)   Had trouble walking   Ciprofloxacin Other (See Comments)   UNSPECIFIED REACTION    Avelox [moxifloxacin Hcl In Nacl] Other (See Comments)   Hypotension.   Mefoxin [cefoxitin Sodium In Dextrose] Rash      Medication List       Accurate as of 07/06/16 10:42 AM. Always use your most recent med list.          amLODipine-benazepril 5-10 MG capsule Commonly known as:  LOTREL TAKE ONE CAPSULE BY MOUTH EVERY DAY   budesonide-formoterol 160-4.5 MCG/ACT inhaler Commonly known as:  SYMBICORT Inhale 2 puffs into the lungs 2 (two) times daily as needed (SOB, wheezing).   cyclobenzaprine 5 MG tablet Commonly known as:  FLEXERIL Take 1 tablet (5 mg total) by mouth 3 (three) times daily as needed for muscle spasms.   diphenhydramine-acetaminophen 25-500 MG Tabs tablet Commonly known as:  TYLENOL PM Take 1 tablet by mouth at bedtime as needed (sleep).   HYDROcodone-homatropine 5-1.5 MG/5ML syrup Commonly known as:  HYCODAN Take 5 mLs by mouth every 6 (six) hours as needed for cough.   KLOR-CON M20 20 MEQ tablet Generic drug:  potassium chloride SA TAKE 1 TABLET BY MOUTH DAILY   lactulose 10 GM/15ML solution Commonly known as:  CHRONULAC Take 15 mLs (10 g total) by mouth daily. Reported on 07/22/2015   lidocaine-prilocaine cream Commonly known as:  EMLA Apply 1 application topically daily as needed (pain).   loratadine 10 MG tablet Commonly known as:  CLARITIN Take 10 mg by mouth daily as needed for allergies.   meloxicam 7.5 MG tablet Commonly known as:  MOBIC Take 1  tablet (7.5 mg total) by mouth as needed.   omeprazole 40 MG capsule Commonly known as:  PRILOSEC Take 40 mg by mouth daily as needed (heartburn).   oxyCODONE 10 mg 12 hr tablet Commonly known as:  OXYCONTIN Take 1 tablet (10 mg total) by mouth every 12 (twelve) hours.   OxyCODONE HCl (Abuse Deter) 5 MG Taba Commonly known as:  OXAYDO Take 5 mg by mouth every 4 (four) hours as needed (pain).   polyethylene glycol powder powder Commonly known as:   GLYCOLAX/MIRALAX Take 17 g by mouth once. Dissolve 1 capful of powder into any liquid and take once daily. Can increase to 2 times daily if no effect after 3-4 days.   PROAIR RESPICLICK 132 (90 Base) MCG/ACT Aepb Generic drug:  Albuterol Sulfate Inhale 2 puffs into the lungs twice daily as needed for shortness of breath or wheezing   prochlorperazine 10 MG tablet Commonly known as:  COMPAZINE Take 10 mg by mouth every 6 (six) hours as needed for nausea or vomiting.   senna-docusate 8.6-50 MG tablet Commonly known as:  Senokot-S Take 1 tablet by mouth daily.   SUMAtriptan 100 MG tablet Commonly known as:  IMITREX Take 100 mg by mouth as needed for migraine or headache. Reported on 04/01/2015   vitamin B-12 1000 MCG tablet Commonly known as:  CYANOCOBALAMIN Take 1,000 mcg by mouth every morning.       Allergies:  Allergies  Allergen Reactions  . Demerol Other (See Comments)    Syncope Passed out  . Topamax Other (See Comments)    Had trouble walking  . Ciprofloxacin Other (See Comments)    UNSPECIFIED REACTION   . Avelox [Moxifloxacin Hcl In Nacl] Other (See Comments)    Hypotension.  . Mefoxin [Cefoxitin Sodium In Dextrose] Rash    Past Medical History, Surgical history, Social history, and Family History were reviewed and updated.  Review of Systems: All other 10 point review of systems is negative.   Physical Exam:  vitals were not taken for this visit.  Wt Readings from Last 3 Encounters:  07/06/16 133 lb (60.3 kg)  06/22/16 135 lb (61.2 kg)  06/08/16 135 lb (61.2 kg)    Ocular: Sclerae unicteric, pupils equal, round and reactive to light Ear-nose-throat: Oropharynx clear, dentition fair Lymphatic: No cervical, supraclavicular or axillary adenopathy Lungs no rales or rhonchi, good excursion bilaterally Heart regular rate and rhythm, no murmur appreciated Abd soft, tender in the right upp quadrant by the rib cage, positive bowel sounds, no liver or spleen  tip palpated on exam, no fluid wave MSK some lower back tenderness and left shoulder tenderness, no joint edema Neuro: non-focal, well-oriented, appropriate affect Breasts: Deferred   Lab Results  Component Value Date   WBC 7.9 07/06/2016   HGB 12.4 07/06/2016   HCT 38.1 07/06/2016   MCV 88 07/06/2016   PLT 225 07/06/2016   Lab Results  Component Value Date   FERRITIN 165 05/11/2016   IRON 57 05/11/2016   TIBC 264 05/11/2016   UIBC 207 05/11/2016   IRONPCTSAT 21 05/11/2016   Lab Results  Component Value Date   RETICCTPCT 1.4 08/13/2008   RBC 4.32 07/06/2016   RETICCTABS 61.6 08/13/2008   No results found for: KPAFRELGTCHN, LAMBDASER, KAPLAMBRATIO No results found for: IGGSERUM, IGA, IGMSERUM No results found for: TOTALPROTELP, ALBUMINELP, A1GS, A2GS, BETS, BETA2SER, GAMS, MSPIKE, SPEI   Chemistry      Component Value Date/Time   NA 138 06/22/2016 1031   NA  140 07/22/2015 1255   K 4.1 06/22/2016 1031   K 4.0 07/22/2015 1255   CL 103 06/22/2016 1031   CO2 25 06/22/2016 1031   CO2 27 07/22/2015 1255   BUN 11 06/22/2016 1031   BUN 9.8 07/22/2015 1255   CREATININE 0.7 06/22/2016 1031   CREATININE 0.6 07/22/2015 1255      Component Value Date/Time   CALCIUM 8.6 06/22/2016 1031   CALCIUM 9.1 07/22/2015 1255   ALKPHOS 78 06/22/2016 1031   AST 23 06/22/2016 1031   ALT 23 06/22/2016 1031   BILITOT 0.60 06/22/2016 1031      Impression and Plan: [name] is a very pleasant 66 yo caucasian female with metastatic adenocarcinoma of the lung. She has had a nice response to treatment so far. Her PET scan yesterday showed improved metabolic acitivity in the involved lymph nodes, pulmonary nodules and bony metastatic disease in the neck, chest, abdomen and pelvis. We will continue with treatment with Abraxane and Avastin today as planned per Dr. Marin Olp. We will now move her treatments out to every 3 weeks.  We will stop her Oxycontin and have her try a 25 mcg Duragesic patch  for pain with oxycodone HCl 5 mg q 4 hours PRN for breakthrough pain.  She will get her new appointment and treatment calendar today.  Both she and her husband know to contact our office with any questions or concerns. We can certainly see her sooner if need be.   Eliezer Bottom, NP 5/30/201810:42 AM

## 2016-07-06 NOTE — Patient Instructions (Addendum)
Denosumab injection What is this medicine? DENOSUMAB (den oh sue mab) slows bone breakdown. Prolia is used to treat osteoporosis in women after menopause and in men. Delton See is used to treat a high calcium level due to cancer and to prevent bone fractures and other bone problems caused by multiple myeloma or cancer bone metastases. Delton See is also used to treat giant cell tumor of the bone. This medicine may be used for other purposes; ask your health care provider or pharmacist if you have questions. COMMON BRAND NAME(S): Prolia, XGEVA What should I tell my health care provider before I take this medicine? They need to know if you have any of these conditions: -dental disease -having surgery or tooth extraction -infection -kidney disease -low levels of calcium or Vitamin D in the blood -malnutrition -on hemodialysis -skin conditions or sensitivity -thyroid or parathyroid disease -an unusual reaction to denosumab, other medicines, foods, dyes, or preservatives -pregnant or trying to get pregnant -breast-feeding How should I use this medicine? This medicine is for injection under the skin. It is given by a health care professional in a hospital or clinic setting. If you are getting Prolia, a special MedGuide will be given to you by the pharmacist with each prescription and refill. Be sure to read this information carefully each time. For Prolia, talk to your pediatrician regarding the use of this medicine in children. Special care may be needed. For Delton See, talk to your pediatrician regarding the use of this medicine in children. While this drug may be prescribed for children as young as 13 years for selected conditions, precautions do apply. Overdosage: If you think you have taken too much of this medicine contact a poison control center or emergency room at once. NOTE: This medicine is only for you. Do not share this medicine with others. What if I miss a dose? It is important not to miss your  dose. Call your doctor or health care professional if you are unable to keep an appointment. What may interact with this medicine? Do not take this medicine with any of the following medications: -other medicines containing denosumab This medicine may also interact with the following medications: -medicines that lower your chance of fighting infection -steroid medicines like prednisone or cortisone This list may not describe all possible interactions. Give your health care provider a list of all the medicines, herbs, non-prescription drugs, or dietary supplements you use. Also tell them if you smoke, drink alcohol, or use illegal drugs. Some items may interact with your medicine. What should I watch for while using this medicine? Visit your doctor or health care professional for regular checks on your progress. Your doctor or health care professional may order blood tests and other tests to see how you are doing. Call your doctor or health care professional for advice if you get a fever, chills or sore throat, or other symptoms of a cold or flu. Do not treat yourself. This drug may decrease your body's ability to fight infection. Try to avoid being around people who are sick. You should make sure you get enough calcium and vitamin D while you are taking this medicine, unless your doctor tells you not to. Discuss the foods you eat and the vitamins you take with your health care professional. See your dentist regularly. Brush and floss your teeth as directed. Before you have any dental work done, tell your dentist you are receiving this medicine. Do not become pregnant while taking this medicine or for 5 months after stopping  it. Talk with your doctor or health care professional about your birth control options while taking this medicine. Women should inform their doctor if they wish to become pregnant or think they might be pregnant. There is a potential for serious side effects to an unborn child. Talk  to your health care professional or pharmacist for more information. What side effects may I notice from receiving this medicine? Side effects that you should report to your doctor or health care professional as soon as possible: -allergic reactions like skin rash, itching or hives, swelling of the face, lips, or tongue -bone pain -breathing problems -dizziness -jaw pain, especially after dental work -redness, blistering, peeling of the skin -signs and symptoms of infection like fever or chills; cough; sore throat; pain or trouble passing urine -signs of low calcium like fast heartbeat, muscle cramps or muscle pain; pain, tingling, numbness in the hands or feet; seizures -unusual bleeding or bruising -unusually weak or tired Side effects that usually do not require medical attention (report to your doctor or health care professional if they continue or are bothersome): -constipation -diarrhea -headache -joint pain -loss of appetite -muscle pain -runny nose -tiredness -upset stomach This list may not describe all possible side effects. Call your doctor for medical advice about side effects. You may report side effects to FDA at 1-800-FDA-1088. Where should I keep my medicine? This medicine is only given in a clinic, doctor's office, or other health care setting and will not be stored at home. NOTE: This sheet is a summary. It may not cover all possible information. If you have questions about this medicine, talk to your doctor, pharmacist, or health care provider.  2018 Elsevier/Gold Standard (2016-02-16 19:17:21) [facility] Discharge Instructions for Patients Receiving Chemotherapy  Today you received the following chemotherapy agents Abraxane and Avastin.  To help prevent nausea and vomiting after your treatment, we encourage you to take your nausea medication as directed.   If you develop nausea and vomiting that is not controlled by your nausea medication, call  the clinic.   BELOW ARE SYMPTOMS THAT SHOULD BE REPORTED IMMEDIATELY:  *FEVER GREATER THAN 100.5 F  *CHILLS WITH OR WITHOUT FEVER  NAUSEA AND VOMITING THAT IS NOT CONTROLLED WITH YOUR NAUSEA MEDICATION  *UNUSUAL SHORTNESS OF BREATH  *UNUSUAL BRUISING OR BLEEDING  TENDERNESS IN MOUTH AND THROAT WITH OR WITHOUT PRESENCE OF ULCERS  *URINARY PROBLEMS  *BOWEL PROBLEMS  UNUSUAL RASH Items with * indicate a potential emergency and should be followed up as soon as possible.  Feel free to call the clinic you have any questions or concerns. The clinic phone number is [phone].  Please show the [facility] at check-in to the Emergency Department and triage nurse.   Bevacizumab injection What is this medicine? BEVACIZUMAB (be va SIZ yoo mab) is a monoclonal antibody. It is used to treat many types of cancer. This medicine may be used for other purposes; ask your health care provider or pharmacist if you have questions. COMMON BRAND NAME(S): Avastin What should I tell my health care provider before I take this medicine? They need to know if you have any of these conditions: -diabetes -heart disease -high blood pressure -history of coughing up blood -prior anthracycline chemotherapy (e.g., doxorubicin, daunorubicin, epirubicin) -recent or ongoing radiation therapy -recent or planning to have surgery -stroke -an unusual or allergic reaction to bevacizumab, hamster proteins, mouse proteins, other medicines, foods, dyes, or preservatives -pregnant or trying to get pregnant -breast-feeding How should  I use this medicine? This medicine is for infusion into a vein. It is given by a health care professional in a hospital or clinic setting. Talk to your pediatrician regarding the use of this medicine in children. Special care may be needed. Overdosage: If you think you have taken too much of this medicine contact a poison control center or emergency room at once. NOTE: This  medicine is only for you. Do not share this medicine with others. What if I miss a dose? It is important not to miss your dose. Call your doctor or health care professional if you are unable to keep an appointment. What may interact with this medicine? Interactions are not expected. This list may not describe all possible interactions. Give your health care provider a list of all the medicines, herbs, non-prescription drugs, or dietary supplements you use. Also tell them if you smoke, drink alcohol, or use illegal drugs. Some items may interact with your medicine. What should I watch for while using this medicine? Your condition will be monitored carefully while you are receiving this medicine. You will need important blood work and urine testing done while you are taking this medicine. This medicine may increase your risk to bruise or bleed. Call your doctor or health care professional if you notice any unusual bleeding. This medicine should be started at least 28 days following major surgery and the site of the surgery should be totally healed. Check with your doctor before scheduling dental work or surgery while you are receiving this treatment. Talk to your doctor if you have recently had surgery or if you have a wound that has not healed. Do not become pregnant while taking this medicine or for 6 months after stopping it. Women should inform their doctor if they wish to become pregnant or think they might be pregnant. There is a potential for serious side effects to an unborn child. Talk to your health care professional or pharmacist for more information. Do not breast-feed an infant while taking this medicine and for 6 months after the last dose. This medicine has caused ovarian failure in some women. This medicine may interfere with the ability to have a child. You should talk to your doctor or health care professional if you are concerned about your fertility. What side effects may I notice from  receiving this medicine? Side effects that you should report to your doctor or health care professional as soon as possible: -allergic reactions like skin rash, itching or hives, swelling of the face, lips, or tongue -chest pain or chest tightness -chills -coughing up blood -high fever -seizures -severe constipation -signs and symptoms of bleeding such as bloody or black, tarry stools; red or dark-brown urine; spitting up blood or brown material that looks like coffee grounds; red spots on the skin; unusual bruising or bleeding from the eye, gums, or nose -signs and symptoms of a blood clot such as breathing problems; chest pain; severe, sudden headache; pain, swelling, warmth in the leg -signs and symptoms of a stroke like changes in vision; confusion; trouble speaking or understanding; severe headaches; sudden numbness or weakness of the face, arm or leg; trouble walking; dizziness; loss of balance or coordination -stomach pain -sweating -swelling of legs or ankles -vomiting -weight gain Side effects that usually do not require medical attention (report to your doctor or health care professional if they continue or are bothersome): -back pain -changes in taste -decreased appetite -dry skin -nausea -tiredness This list may not describe all possible  side effects. Call your doctor for medical advice about side effects. You may report side effects to FDA at 1-800-FDA-1088. Where should I keep my medicine? This drug is given in a hospital or clinic and will not be stored at home. NOTE: This sheet is a summary. It may not cover all possible information. If you have questions about this medicine, talk to your doctor, pharmacist, or health care provider.  2018 Elsevier/Gold Standard (2016-01-22 14:33:29) Nanoparticle Albumin-Bound Paclitaxel injection What is this medicine? NANOPARTICLE ALBUMIN-BOUND PACLITAXEL (Na no PAHR ti kuhl al BYOO muhn-bound PAK li TAX el) is a chemotherapy drug. It  targets fast dividing cells, like cancer cells, and causes these cells to die. This medicine is used to treat advanced breast cancer and advanced lung cancer. This medicine may be used for other purposes; ask your health care provider or pharmacist if you have questions. COMMON BRAND NAME(S): Abraxane What should I tell my health care provider before I take this medicine? They need to know if you have any of these conditions: -kidney disease -liver disease -low blood counts, like low platelets, red blood cells, or white blood cells -recent or ongoing radiation therapy -an unusual or allergic reaction to paclitaxel, albumin, other chemotherapy, other medicines, foods, dyes, or preservatives -pregnant or trying to get pregnant -breast-feeding How should I use this medicine? This drug is given as an infusion into a vein. It is administered in a hospital or clinic by a specially trained health care professional. Talk to your pediatrician regarding the use of this medicine in children. Special care may be needed. Overdosage: If you think you have taken too much of this medicine contact a poison control center or emergency room at once. NOTE: This medicine is only for you. Do not share this medicine with others. What if I miss a dose? It is important not to miss your dose. Call your doctor or health care professional if you are unable to keep an appointment. What may interact with this medicine? -cyclosporine -diazepam -ketoconazole -medicines to increase blood counts like filgrastim, pegfilgrastim, sargramostim -other chemotherapy drugs like cisplatin, doxorubicin, epirubicin, etoposide, teniposide, vincristine -quinidine -testosterone -vaccines -verapamil Talk to your doctor or health care professional before taking any of these medicines: -acetaminophen -aspirin -ibuprofen -ketoprofen -naproxen This list may not describe all possible interactions. Give your health care provider a list  of all the medicines, herbs, non-prescription drugs, or dietary supplements you use. Also tell them if you smoke, drink alcohol, or use illegal drugs. Some items may interact with your medicine. What should I watch for while using this medicine? Your condition will be monitored carefully while you are receiving this medicine. You will need important blood work done while you are taking this medicine. This medicine can cause serious allergic reactions. If you experience allergic reactions like skin rash, itching or hives, swelling of the face, lips, or tongue, tell your doctor or health care professional right away. In some cases, you may be given additional medicines to help with side effects. Follow all directions for their use. This drug may make you feel generally unwell. This is not uncommon, as chemotherapy can affect healthy cells as well as cancer cells. Report any side effects. Continue your course of treatment even though you feel ill unless your doctor tells you to stop. Call your doctor or health care professional for advice if you get a fever, chills or sore throat, or other symptoms of a cold or flu. Do not treat yourself. This drug decreases  your body's ability to fight infections. Try to avoid being around people who are sick. This medicine may increase your risk to bruise or bleed. Call your doctor or health care professional if you notice any unusual bleeding. Be careful brushing and flossing your teeth or using a toothpick because you may get an infection or bleed more easily. If you have any dental work done, tell your dentist you are receiving this medicine. Avoid taking products that contain aspirin, acetaminophen, ibuprofen, naproxen, or ketoprofen unless instructed by your doctor. These medicines may hide a fever. Do not become pregnant while taking this medicine. Women should inform their doctor if they wish to become pregnant or think they might be pregnant. There is a potential  for serious side effects to an unborn child. Talk to your health care professional or pharmacist for more information. Do not breast-feed an infant while taking this medicine. Men are advised not to father a child while receiving this medicine. What side effects may I notice from receiving this medicine? Side effects that you should report to your doctor or health care professional as soon as possible: -allergic reactions like skin rash, itching or hives, swelling of the face, lips, or tongue -low blood counts - This drug may decrease the number of white blood cells, red blood cells and platelets. You may be at increased risk for infections and bleeding. -signs of infection - fever or chills, cough, sore throat, pain or difficulty passing urine -signs of decreased platelets or bleeding - bruising, pinpoint red spots on the skin, black, tarry stools, nosebleeds -signs of decreased red blood cells - unusually weak or tired, fainting spells, lightheadedness -breathing problems -changes in vision -chest pain -high or low blood pressure -mouth sores -nausea and vomiting -pain, swelling, redness or irritation at the injection site -pain, tingling, numbness in the hands or feet -slow or irregular heartbeat -swelling of the ankle, feet, hands Side effects that usually do not require medical attention (report to your doctor or health care professional if they continue or are bothersome): -aches, pains -changes in the color of fingernails -diarrhea -hair loss -loss of appetite This list may not describe all possible side effects. Call your doctor for medical advice about side effects. You may report side effects to FDA at 1-800-FDA-1088. Where should I keep my medicine? This drug is given in a hospital or clinic and will not be stored at home. NOTE: This sheet is a summary. It may not cover all possible information. If you have questions about this medicine, talk to your doctor, pharmacist, or  health care provider.  2018 Elsevier/Gold Standard (2014-11-26 10:05:20)

## 2016-07-06 NOTE — Patient Instructions (Signed)
Implanted Port Home Guide An implanted port is a type of central line that is placed under the skin. Central lines are used to provide IV access when treatment or nutrition needs to be given through a person's veins. Implanted ports are used for long-term IV access. An implanted port may be placed because:  You need IV medicine that would be irritating to the small veins in your hands or arms.  You need long-term IV medicines, such as antibiotics.  You need IV nutrition for a long period.  You need frequent blood draws for lab tests.  You need dialysis.  Implanted ports are usually placed in the chest area, but they can also be placed in the upper arm, the abdomen, or the leg. An implanted port has two main parts:  Reservoir. The reservoir is round and will appear as a small, raised area under your skin. The reservoir is the part where a needle is inserted to give medicines or draw blood.  Catheter. The catheter is a thin, flexible tube that extends from the reservoir. The catheter is placed into a large vein. Medicine that is inserted into the reservoir goes into the catheter and then into the vein.  How will I care for my incision site? Do not get the incision site wet. Bathe or shower as directed by your health care provider. How is my port accessed? Special steps must be taken to access the port:  Before the port is accessed, a numbing cream can be placed on the skin. This helps numb the skin over the port site.  Your health care provider uses a sterile technique to access the port. ? Your health care provider must put on a mask and sterile gloves. ? The skin over your port is cleaned carefully with an antiseptic and allowed to dry. ? The port is gently pinched between sterile gloves, and a needle is inserted into the port.  Only "non-coring" port needles should be used to access the port. Once the port is accessed, a blood return should be checked. This helps ensure that the port  is in the vein and is not clogged.  If your port needs to remain accessed for a constant infusion, a clear (transparent) bandage will be placed over the needle site. The bandage and needle will need to be changed every week, or as directed by your health care provider.  Keep the bandage covering the needle clean and dry. Do not get it wet. Follow your health care provider's instructions on how to take a shower or bath while the port is accessed.  If your port does not need to stay accessed, no bandage is needed over the port.  What is flushing? Flushing helps keep the port from getting clogged. Follow your health care provider's instructions on how and when to flush the port. Ports are usually flushed with saline solution or a medicine called heparin. The need for flushing will depend on how the port is used.  If the port is used for intermittent medicines or blood draws, the port will need to be flushed: ? After medicines have been given. ? After blood has been drawn. ? As part of routine maintenance.  If a constant infusion is running, the port may not need to be flushed.  How long will my port stay implanted? The port can stay in for as long as your health care provider thinks it is needed. When it is time for the port to come out, surgery will be   done to remove it. The procedure is similar to the one performed when the port was put in. When should I seek immediate medical care? When you have an implanted port, you should seek immediate medical care if:  You notice a bad smell coming from the incision site.  You have swelling, redness, or drainage at the incision site.  You have more swelling or pain at the port site or the surrounding area.  You have a fever that is not controlled with medicine.  This information is not intended to replace advice given to you by your health care provider. Make sure you discuss any questions you have with your health care provider. Document  Released: 01/24/2005 Document Revised: 07/02/2015 Document Reviewed: 10/01/2012 Elsevier Interactive Patient Education  2017 [employer]  

## 2016-07-07 ENCOUNTER — Other Ambulatory Visit: Payer: Self-pay | Admitting: *Deleted

## 2016-07-07 DIAGNOSIS — M47812 Spondylosis without myelopathy or radiculopathy, cervical region: Secondary | ICD-10-CM

## 2016-07-07 DIAGNOSIS — I10 Essential (primary) hypertension: Secondary | ICD-10-CM

## 2016-07-07 MED ORDER — AMLODIPINE BESY-BENAZEPRIL HCL 5-10 MG PO CAPS: 1.0000 | ORAL_CAPSULE | Freq: Every day | ORAL | 1 refills | Status: DC

## 2016-07-17 IMAGING — CR DG CHEST 2V
2 series · 2 of 2 positions shown · non-contrast
Comparison: CT chest of 06/16/2014 and chest x-ray of 05/21/2014

CLINICAL DATA: Shortness of breath for 4 months with exertion,
former smoking history

EXAM:
CHEST  2 VIEW

[view not recorded (1 of 2)]
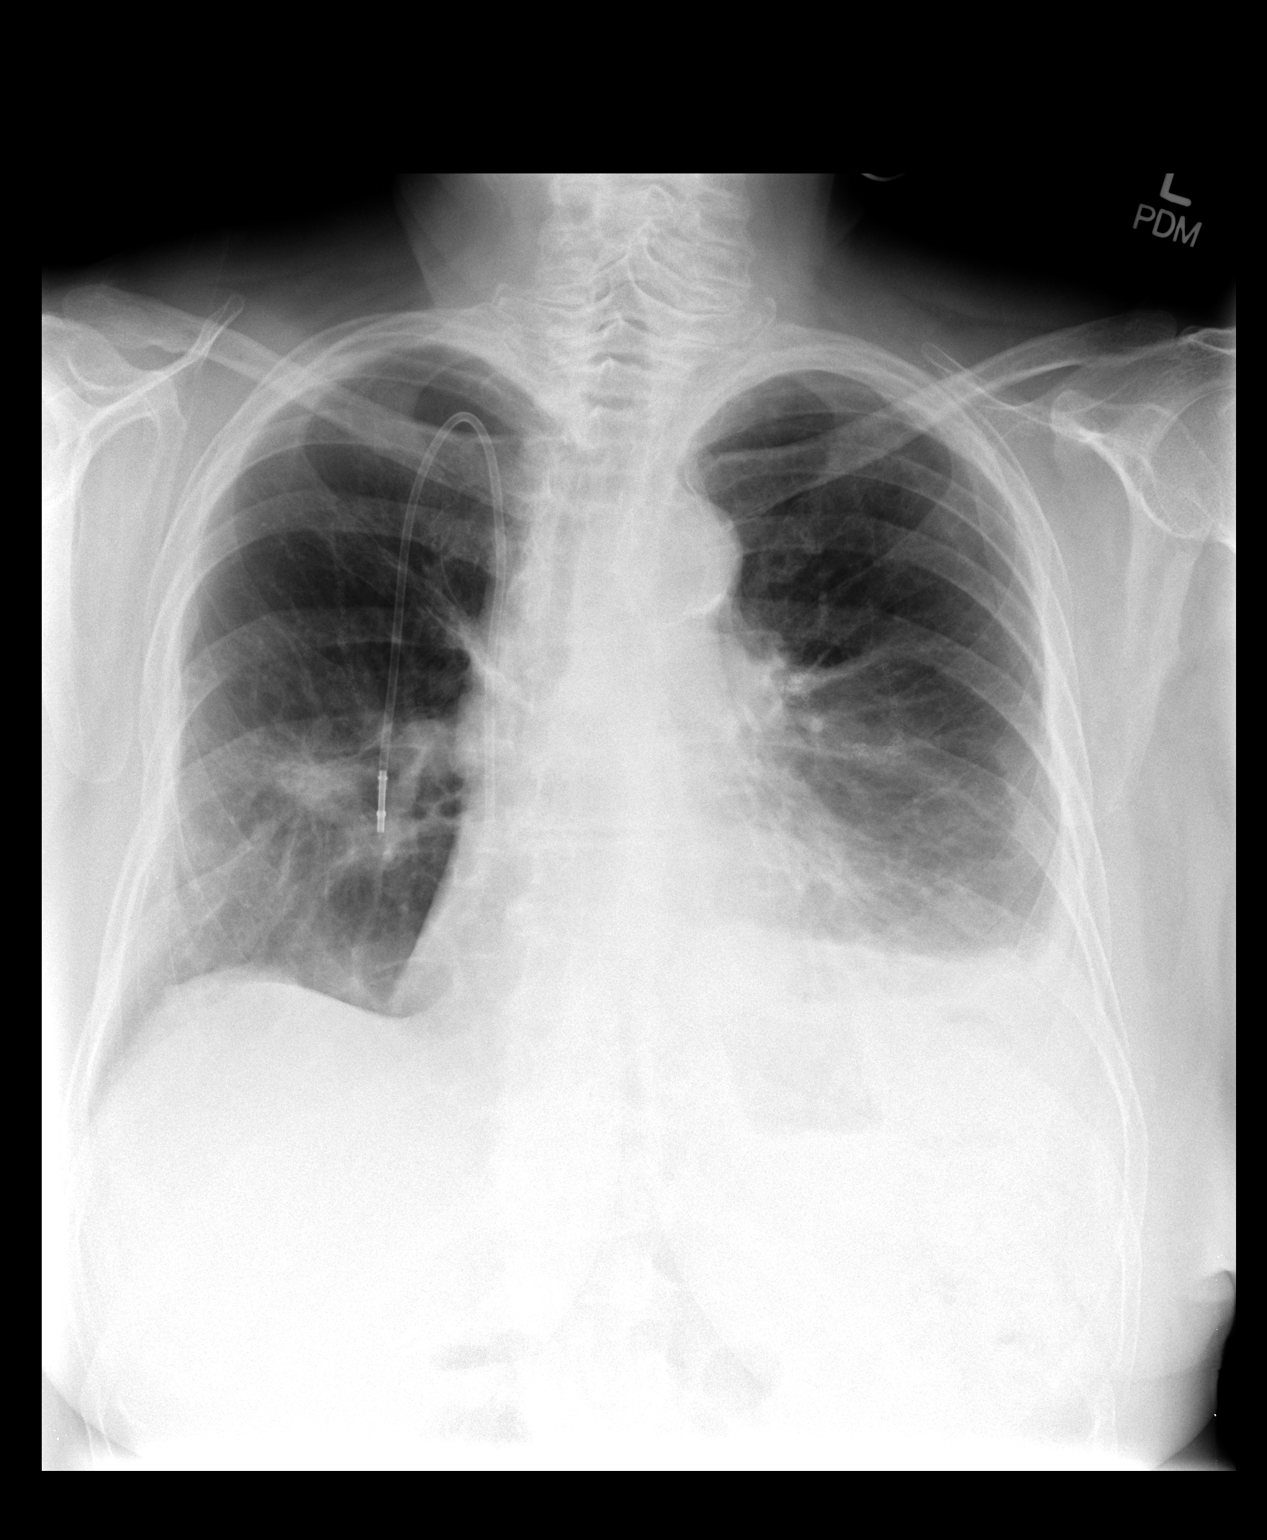

[view not recorded (2 of 2)]
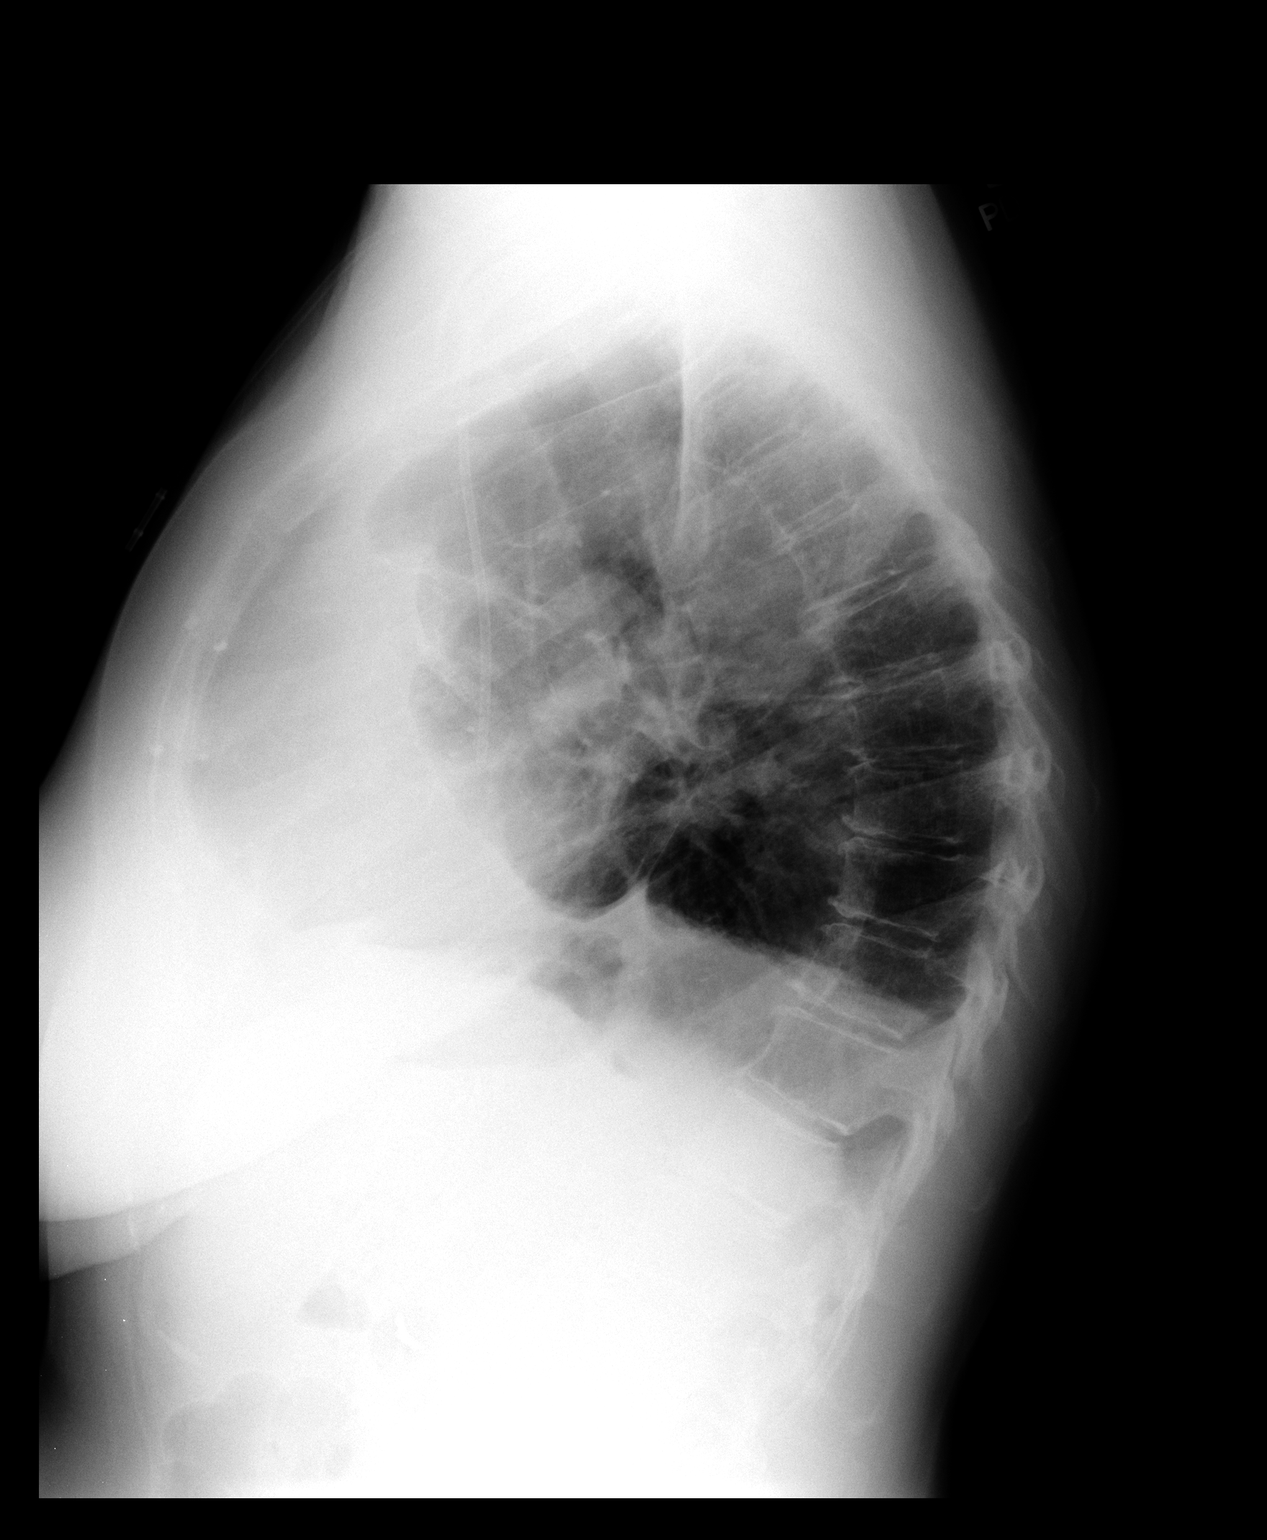

[2 of 2 positions shown; findings below may reference images not displayed]

FINDINGS: An irregular opacity remains in the right mid lung corresponding to
the poor defined opacity on CT chest within the right lower lobe.
Left basilar atelectasis and/or small left effusion is present.
Right-sided Port-A-Cath tip is seen to the lower SVC. Heart size is
stable.
IMPRESSION: 1. No change in the irregular opacity in the right lower lobe better
seen on recent CT of the chest.
2. Left basilar opacity most consistent with atelectasis and small
left effusion.
3. Port-A-Cath tip in low SVC.

## 2016-07-18 DIAGNOSIS — M5412 Radiculopathy, cervical region: Secondary | ICD-10-CM | POA: Diagnosis not present

## 2016-07-20 ENCOUNTER — Ambulatory Visit: Payer: PPO

## 2016-07-20 ENCOUNTER — Other Ambulatory Visit: Payer: PPO

## 2016-07-20 ENCOUNTER — Ambulatory Visit: Payer: PPO | Admitting: Hematology & Oncology

## 2016-07-27 ENCOUNTER — Ambulatory Visit: Payer: PPO

## 2016-07-27 ENCOUNTER — Ambulatory Visit (HOSPITAL_BASED_OUTPATIENT_CLINIC_OR_DEPARTMENT_OTHER): Payer: PPO

## 2016-07-27 ENCOUNTER — Other Ambulatory Visit: Payer: Self-pay | Admitting: *Deleted

## 2016-07-27 ENCOUNTER — Other Ambulatory Visit (HOSPITAL_BASED_OUTPATIENT_CLINIC_OR_DEPARTMENT_OTHER): Payer: PPO

## 2016-07-27 ENCOUNTER — Ambulatory Visit (HOSPITAL_BASED_OUTPATIENT_CLINIC_OR_DEPARTMENT_OTHER): Payer: PPO | Admitting: Family

## 2016-07-27 VITALS — BP 118/77 | HR 102 | Temp 98.1°F | Resp 18 | Wt 134.1 lb

## 2016-07-27 DIAGNOSIS — D509 Iron deficiency anemia, unspecified: Secondary | ICD-10-CM

## 2016-07-27 DIAGNOSIS — Z5111 Encounter for antineoplastic chemotherapy: Secondary | ICD-10-CM | POA: Diagnosis not present

## 2016-07-27 DIAGNOSIS — C3492 Malignant neoplasm of unspecified part of left bronchus or lung: Secondary | ICD-10-CM

## 2016-07-27 DIAGNOSIS — G8929 Other chronic pain: Secondary | ICD-10-CM

## 2016-07-27 DIAGNOSIS — J449 Chronic obstructive pulmonary disease, unspecified: Secondary | ICD-10-CM

## 2016-07-27 DIAGNOSIS — M5441 Lumbago with sciatica, right side: Secondary | ICD-10-CM

## 2016-07-27 DIAGNOSIS — C342 Malignant neoplasm of middle lobe, bronchus or lung: Secondary | ICD-10-CM

## 2016-07-27 DIAGNOSIS — C3490 Malignant neoplasm of unspecified part of unspecified bronchus or lung: Secondary | ICD-10-CM

## 2016-07-27 DIAGNOSIS — C7951 Secondary malignant neoplasm of bone: Secondary | ICD-10-CM

## 2016-07-27 DIAGNOSIS — J910 Malignant pleural effusion: Secondary | ICD-10-CM | POA: Diagnosis not present

## 2016-07-27 DIAGNOSIS — M5442 Lumbago with sciatica, left side: Secondary | ICD-10-CM

## 2016-07-27 DIAGNOSIS — Z5112 Encounter for antineoplastic immunotherapy: Secondary | ICD-10-CM

## 2016-07-27 DIAGNOSIS — N3000 Acute cystitis without hematuria: Secondary | ICD-10-CM | POA: Diagnosis not present

## 2016-07-27 DIAGNOSIS — N390 Urinary tract infection, site not specified: Secondary | ICD-10-CM | POA: Diagnosis not present

## 2016-07-27 DIAGNOSIS — R05 Cough: Secondary | ICD-10-CM | POA: Diagnosis not present

## 2016-07-27 DIAGNOSIS — C3491 Malignant neoplasm of unspecified part of right bronchus or lung: Secondary | ICD-10-CM

## 2016-07-27 DIAGNOSIS — R64 Cachexia: Secondary | ICD-10-CM

## 2016-07-27 LAB — LACTATE DEHYDROGENASE: LDH: 210 U/L (ref 125–245)

## 2016-07-27 LAB — URINALYSIS, MICROSCOPIC (CHCC SATELLITE)
BILIRUBIN (URINE): NEGATIVE
Blood: NEGATIVE
Glucose: NEGATIVE mg/dL
KETONES: NEGATIVE mg/dL
NITRITE: POSITIVE
PH: 6 (ref 4.60–8.00)
Protein: NEGATIVE mg/dL
Specific Gravity, Urine: 1.015 (ref 1.003–1.035)
Urobilinogen, UR: 0.2 mg/dL (ref 0.2–1)

## 2016-07-27 LAB — CMP (CANCER CENTER ONLY)
ALT(SGPT): 19 U/L (ref 10–47)
AST: 24 U/L (ref 11–38)
Albumin: 3.6 g/dL (ref 3.3–5.5)
Alkaline Phosphatase: 89 U/L — ABNORMAL HIGH (ref 26–84)
BILIRUBIN TOTAL: 0.6 mg/dL (ref 0.20–1.60)
BUN: 10 mg/dL (ref 7–22)
CO2: 24 mEq/L (ref 18–33)
Calcium: 8.8 mg/dL (ref 8.0–10.3)
Chloride: 105 mEq/L (ref 98–108)
Creat: 0.6 mg/dl (ref 0.6–1.2)
GLUCOSE: 120 mg/dL — AB (ref 73–118)
Potassium: 3.3 mEq/L (ref 3.3–4.7)
Sodium: 137 mEq/L (ref 128–145)
Total Protein: 7.1 g/dL (ref 6.4–8.1)

## 2016-07-27 LAB — CBC WITH DIFFERENTIAL (CANCER CENTER ONLY)
BASO#: 0.1 10*3/uL (ref 0.0–0.2)
BASO%: 1.1 % (ref 0.0–2.0)
EOS%: 1.8 % (ref 0.0–7.0)
Eosinophils Absolute: 0.1 10*3/uL (ref 0.0–0.5)
HCT: 37 % (ref 34.8–46.6)
HGB: 11.9 g/dL (ref 11.6–15.9)
LYMPH#: 1.9 10*3/uL (ref 0.9–3.3)
LYMPH%: 29.7 % (ref 14.0–48.0)
MCH: 28.2 pg (ref 26.0–34.0)
MCHC: 32.2 g/dL (ref 32.0–36.0)
MCV: 88 fL (ref 81–101)
MONO#: 0.6 10*3/uL (ref 0.1–0.9)
MONO%: 9.5 % (ref 0.0–13.0)
NEUT#: 3.8 10*3/uL (ref 1.5–6.5)
NEUT%: 57.9 % (ref 39.6–80.0)
PLATELETS: 224 10*3/uL (ref 145–400)
RBC: 4.22 10*6/uL (ref 3.70–5.32)
RDW: 18.9 % — AB (ref 11.1–15.7)
WBC: 6.5 10*3/uL (ref 3.9–10.0)

## 2016-07-27 MED ORDER — OXYCODONE HCL ER 20 MG PO T12A: 20.0000 mg | EXTENDED_RELEASE_TABLET | Freq: Two times a day (BID) | ORAL | 0 refills | Status: DC

## 2016-07-27 MED ORDER — HEPARIN SOD (PORK) LOCK FLUSH 100 UNIT/ML IV SOLN
500.0000 [IU] | Freq: Once | INTRAVENOUS | Status: DC | PRN
  Filled 2016-07-27: qty 5

## 2016-07-27 MED ORDER — PROCHLORPERAZINE MALEATE 10 MG PO TABS
10.0000 mg | ORAL_TABLET | Freq: Once | ORAL | Status: AC
  Administered 2016-07-27: 10 mg via ORAL

## 2016-07-27 MED ORDER — SULFAMETHOXAZOLE-TRIMETHOPRIM 800-160 MG PO TABS: 1.0000 | ORAL_TABLET | Freq: Two times a day (BID) | ORAL | 0 refills | Status: DC

## 2016-07-27 MED ORDER — SODIUM CHLORIDE 0.9 % IV SOLN
Freq: Once | INTRAVENOUS | Status: AC
  Administered 2016-07-27: 12:00:00 via INTRAVENOUS

## 2016-07-27 MED ORDER — SODIUM CHLORIDE 0.9 % IV SOLN
Freq: Once | INTRAVENOUS | Status: AC
  Administered 2016-07-27: 11:00:00 via INTRAVENOUS

## 2016-07-27 MED ORDER — SODIUM CHLORIDE 0.9% FLUSH
10.0000 mL | INTRAVENOUS | Status: DC | PRN
Start: 2016-07-27 — End: 2016-07-27
  Administered 2016-07-27: 10 mL
  Filled 2016-07-27: qty 10

## 2016-07-27 MED ORDER — SODIUM CHLORIDE 0.9 % IV SOLN
10.5000 mg/kg | Freq: Once | INTRAVENOUS | Status: AC
  Administered 2016-07-27: 600 mg via INTRAVENOUS
  Filled 2016-07-27: qty 16

## 2016-07-27 MED ORDER — KETOROLAC TROMETHAMINE 15 MG/ML IJ SOLN: 30.0000 mg | Freq: Once | INTRAMUSCULAR | Status: AC

## 2016-07-27 MED ORDER — PACLITAXEL PROTEIN-BOUND CHEMO INJECTION 100 MG
81.0000 mg/m2 | Freq: Once | INTRAVENOUS | Status: AC
  Administered 2016-07-27: 125 mg via INTRAVENOUS
  Filled 2016-07-27: qty 25

## 2016-07-27 MED ORDER — KETOROLAC TROMETHAMINE 15 MG/ML IJ SOLN
INTRAMUSCULAR | Status: AC
  Filled 2016-07-27: qty 2

## 2016-07-27 MED ORDER — KETOROLAC TROMETHAMINE 15 MG/ML IJ SOLN
30.0000 mg | Freq: Once | INTRAMUSCULAR | Status: DC
Start: 2016-07-27 — End: 2016-07-27
  Administered 2016-07-27: 30 mg via INTRAVENOUS

## 2016-07-27 MED ORDER — DENOSUMAB 120 MG/1.7ML ~~LOC~~ SOLN
120.0000 mg | Freq: Once | SUBCUTANEOUS | Status: AC
  Administered 2016-07-27: 120 mg via SUBCUTANEOUS
  Filled 2016-07-27: qty 1.7

## 2016-07-27 MED ORDER — HEPARIN SOD (PORK) LOCK FLUSH 100 UNIT/ML IV SOLN
500.0000 [IU] | Freq: Once | INTRAVENOUS | Status: AC | PRN
  Administered 2016-07-27: 500 [IU]
  Filled 2016-07-27: qty 5

## 2016-07-27 MED ORDER — SODIUM CHLORIDE 0.9% FLUSH
10.0000 mL | INTRAVENOUS | Status: DC | PRN
  Filled 2016-07-27: qty 10

## 2016-07-27 MED ORDER — HYDROCODONE-HOMATROPINE 5-1.5 MG/5ML PO SYRP: 5.0000 mL | ORAL_SOLUTION | Freq: Four times a day (QID) | ORAL | 0 refills | Status: AC | PRN

## 2016-07-27 MED ORDER — PROCHLORPERAZINE MALEATE 10 MG PO TABS
ORAL_TABLET | ORAL | Status: AC
  Filled 2016-07-27: qty 1

## 2016-07-27 NOTE — Patient Instructions (Signed)
Bevacizumab injection What is this medicine? BEVACIZUMAB (be va SIZ yoo mab) is a monoclonal antibody. It is used to treat many types of cancer. This medicine may be used for other purposes; ask your health care provider or pharmacist if you have questions. COMMON BRAND NAME(S): Avastin What should I tell my health care provider before I take this medicine? They need to know if you have any of these conditions: -diabetes -heart disease -high blood pressure -history of coughing up blood -prior anthracycline chemotherapy (e.g., doxorubicin, daunorubicin, epirubicin) -recent or ongoing radiation therapy -recent or planning to have surgery -stroke -an unusual or allergic reaction to bevacizumab, hamster proteins, mouse proteins, other medicines, foods, dyes, or preservatives -pregnant or trying to get pregnant -breast-feeding How should I use this medicine? This medicine is for infusion into a vein. It is given by a health care professional in a hospital or clinic setting. Talk to your pediatrician regarding the use of this medicine in children. Special care may be needed. Overdosage: If you think you have taken too much of this medicine contact a poison control center or emergency room at once. NOTE: This medicine is only for you. Do not share this medicine with others. What if I miss a dose? It is important not to miss your dose. Call your doctor or health care professional if you are unable to keep an appointment. What may interact with this medicine? Interactions are not expected. This list may not describe all possible interactions. Give your health care provider a list of all the medicines, herbs, non-prescription drugs, or dietary supplements you use. Also tell them if you smoke, drink alcohol, or use illegal drugs. Some items may interact with your medicine. What should I watch for while using this medicine? Your condition will be monitored carefully while you are receiving this  medicine. You will need important blood work and urine testing done while you are taking this medicine. This medicine may increase your risk to bruise or bleed. Call your doctor or health care professional if you notice any unusual bleeding. This medicine should be started at least 28 days following major surgery and the site of the surgery should be totally healed. Check with your doctor before scheduling dental work or surgery while you are receiving this treatment. Talk to your doctor if you have recently had surgery or if you have a wound that has not healed. Do not become pregnant while taking this medicine or for 6 months after stopping it. Women should inform their doctor if they wish to become pregnant or think they might be pregnant. There is a potential for serious side effects to an unborn child. Talk to your health care professional or pharmacist for more information. Do not breast-feed an infant while taking this medicine and for 6 months after the last dose. This medicine has caused ovarian failure in some women. This medicine may interfere with the ability to have a child. You should talk to your doctor or health care professional if you are concerned about your fertility. What side effects may I notice from receiving this medicine? Side effects that you should report to your doctor or health care professional as soon as possible: -allergic reactions like skin rash, itching or hives, swelling of the face, lips, or tongue -chest pain or chest tightness -chills -coughing up blood -high fever -seizures -severe constipation -signs and symptoms of bleeding such as bloody or black, tarry stools; red or dark-brown urine; spitting up blood or brown material that looks   like coffee grounds; red spots on the skin; unusual bruising or bleeding from the eye, gums, or nose -signs and symptoms of a blood clot such as breathing problems; chest pain; severe, sudden headache; pain, swelling, warmth in  the leg -signs and symptoms of a stroke like changes in vision; confusion; trouble speaking or understanding; severe headaches; sudden numbness or weakness of the face, arm or leg; trouble walking; dizziness; loss of balance or coordination -stomach pain -sweating -swelling of legs or ankles -vomiting -weight gain Side effects that usually do not require medical attention (report to your doctor or health care professional if they continue or are bothersome): -back pain -changes in taste -decreased appetite -dry skin -nausea -tiredness This list may not describe all possible side effects. Call your doctor for medical advice about side effects. You may report side effects to FDA at 1-800-FDA-1088. Where should I keep my medicine? This drug is given in a hospital or clinic and will not be stored at home. NOTE: This sheet is a summary. It may not cover all possible information. If you have questions about this medicine, talk to your doctor, pharmacist, or health care provider.  2018 Elsevier/Gold Standard (2016-01-22 14:33:29) Nanoparticle Albumin-Bound Paclitaxel injection What is this medicine? NANOPARTICLE ALBUMIN-BOUND PACLITAXEL (Na no PAHR ti kuhl al BYOO muhn-bound PAK li TAX el) is a chemotherapy drug. It targets fast dividing cells, like cancer cells, and causes these cells to die. This medicine is used to treat advanced breast cancer and advanced lung cancer. This medicine may be used for other purposes; ask your health care provider or pharmacist if you have questions. COMMON BRAND NAME(S): Abraxane What should I tell my health care provider before I take this medicine? They need to know if you have any of these conditions: -kidney disease -liver disease -low blood counts, like low platelets, red blood cells, or white blood cells -recent or ongoing radiation therapy -an unusual or allergic reaction to paclitaxel, albumin, other chemotherapy, other medicines, foods, dyes, or  preservatives -pregnant or trying to get pregnant -breast-feeding How should I use this medicine? This drug is given as an infusion into a vein. It is administered in a hospital or clinic by a specially trained health care professional. Talk to your pediatrician regarding the use of this medicine in children. Special care may be needed. Overdosage: If you think you have taken too much of this medicine contact a poison control center or emergency room at once. NOTE: This medicine is only for you. Do not share this medicine with others. What if I miss a dose? It is important not to miss your dose. Call your doctor or health care professional if you are unable to keep an appointment. What may interact with this medicine? -cyclosporine -diazepam -ketoconazole -medicines to increase blood counts like filgrastim, pegfilgrastim, sargramostim -other chemotherapy drugs like cisplatin, doxorubicin, epirubicin, etoposide, teniposide, vincristine -quinidine -testosterone -vaccines -verapamil Talk to your doctor or health care professional before taking any of these medicines: -acetaminophen -aspirin -ibuprofen -ketoprofen -naproxen This list may not describe all possible interactions. Give your health care provider a list of all the medicines, herbs, non-prescription drugs, or dietary supplements you use. Also tell them if you smoke, drink alcohol, or use illegal drugs. Some items may interact with your medicine. What should I watch for while using this medicine? Your condition will be monitored carefully while you are receiving this medicine. You will need important blood work done while you are taking this medicine. This medicine can cause  serious allergic reactions. If you experience allergic reactions like skin rash, itching or hives, swelling of the face, lips, or tongue, tell your doctor or health care professional right away. In some cases, you may be given additional medicines to help with  side effects. Follow all directions for their use. This drug may make you feel generally unwell. This is not uncommon, as chemotherapy can affect healthy cells as well as cancer cells. Report any side effects. Continue your course of treatment even though you feel ill unless your doctor tells you to stop. Call your doctor or health care professional for advice if you get a fever, chills or sore throat, or other symptoms of a cold or flu. Do not treat yourself. This drug decreases your body's ability to fight infections. Try to avoid being around people who are sick. This medicine may increase your risk to bruise or bleed. Call your doctor or health care professional if you notice any unusual bleeding. Be careful brushing and flossing your teeth or using a toothpick because you may get an infection or bleed more easily. If you have any dental work done, tell your dentist you are receiving this medicine. Avoid taking products that contain aspirin, acetaminophen, ibuprofen, naproxen, or ketoprofen unless instructed by your doctor. These medicines may hide a fever. Do not become pregnant while taking this medicine. Women should inform their doctor if they wish to become pregnant or think they might be pregnant. There is a potential for serious side effects to an unborn child. Talk to your health care professional or pharmacist for more information. Do not breast-feed an infant while taking this medicine. Men are advised not to father a child while receiving this medicine. What side effects may I notice from receiving this medicine? Side effects that you should report to your doctor or health care professional as soon as possible: -allergic reactions like skin rash, itching or hives, swelling of the face, lips, or tongue -low blood counts - This drug may decrease the number of white blood cells, red blood cells and platelets. You may be at increased risk for infections and bleeding. -signs of infection -  fever or chills, cough, sore throat, pain or difficulty passing urine -signs of decreased platelets or bleeding - bruising, pinpoint red spots on the skin, black, tarry stools, nosebleeds -signs of decreased red blood cells - unusually weak or tired, fainting spells, lightheadedness -breathing problems -changes in vision -chest pain -high or low blood pressure -mouth sores -nausea and vomiting -pain, swelling, redness or irritation at the injection site -pain, tingling, numbness in the hands or feet -slow or irregular heartbeat -swelling of the ankle, feet, hands Side effects that usually do not require medical attention (report to your doctor or health care professional if they continue or are bothersome): -aches, pains -changes in the color of fingernails -diarrhea -hair loss -loss of appetite This list may not describe all possible side effects. Call your doctor for medical advice about side effects. You may report side effects to FDA at 1-800-FDA-1088. Where should I keep my medicine? This drug is given in a hospital or clinic and will not be stored at home. NOTE: This sheet is a summary. It may not cover all possible information. If you have questions about this medicine, talk to your doctor, pharmacist, or health care provider.  2018 Elsevier/Gold Standard (2014-11-26 10:05:20)

## 2016-07-27 NOTE — Patient Instructions (Signed)
Implanted Port Home Guide An implanted port is a type of central line that is placed under the skin. Central lines are used to provide IV access when treatment or nutrition needs to be given through a person's veins. Implanted ports are used for long-term IV access. An implanted port may be placed because:  You need IV medicine that would be irritating to the small veins in your hands or arms.  You need long-term IV medicines, such as antibiotics.  You need IV nutrition for a long period.  You need frequent blood draws for lab tests.  You need dialysis.  Implanted ports are usually placed in the chest area, but they can also be placed in the upper arm, the abdomen, or the leg. An implanted port has two main parts:  Reservoir. The reservoir is round and will appear as a small, raised area under your skin. The reservoir is the part where a needle is inserted to give medicines or draw blood.  Catheter. The catheter is a thin, flexible tube that extends from the reservoir. The catheter is placed into a large vein. Medicine that is inserted into the reservoir goes into the catheter and then into the vein.  How will I care for my incision site? Do not get the incision site wet. Bathe or shower as directed by your health care provider. How is my port accessed? Special steps must be taken to access the port:  Before the port is accessed, a numbing cream can be placed on the skin. This helps numb the skin over the port site.  Your health care provider uses a sterile technique to access the port. ? Your health care provider must put on a mask and sterile gloves. ? The skin over your port is cleaned carefully with an antiseptic and allowed to dry. ? The port is gently pinched between sterile gloves, and a needle is inserted into the port.  Only "non-coring" port needles should be used to access the port. Once the port is accessed, a blood return should be checked. This helps ensure that the port  is in the vein and is not clogged.  If your port needs to remain accessed for a constant infusion, a clear (transparent) bandage will be placed over the needle site. The bandage and needle will need to be changed every week, or as directed by your health care provider.  Keep the bandage covering the needle clean and dry. Do not get it wet. Follow your health care provider's instructions on how to take a shower or bath while the port is accessed.  If your port does not need to stay accessed, no bandage is needed over the port.  What is flushing? Flushing helps keep the port from getting clogged. Follow your health care provider's instructions on how and when to flush the port. Ports are usually flushed with saline solution or a medicine called heparin. The need for flushing will depend on how the port is used.  If the port is used for intermittent medicines or blood draws, the port will need to be flushed: ? After medicines have been given. ? After blood has been drawn. ? As part of routine maintenance.  If a constant infusion is running, the port may not need to be flushed.  How long will my port stay implanted? The port can stay in for as long as your health care provider thinks it is needed. When it is time for the port to come out, surgery will be   done to remove it. The procedure is similar to the one performed when the port was put in. When should I seek immediate medical care? When you have an implanted port, you should seek immediate medical care if:  You notice a bad smell coming from the incision site.  You have swelling, redness, or drainage at the incision site.  You have more swelling or pain at the port site or the surrounding area.  You have a fever that is not controlled with medicine.  This information is not intended to replace advice given to you by your health care provider. Make sure you discuss any questions you have with your health care provider. Document  Released: 01/24/2005 Document Revised: 07/02/2015 Document Reviewed: 10/01/2012 Elsevier Interactive Patient Education  2017 [employer]  

## 2016-07-27 NOTE — Progress Notes (Signed)
Hematology and Oncology Follow Up Visit  [name] [id number] Jun 28, 1950 66 y.o. 07/27/2016   Principle Diagnosis:  Metastatic adenocarcinoma the lung - bone metastases, lymph node metastasis and intrapulmonary metastasis Malignant left pleural effusion    Current Therapy:   Abraxane/Avastin - s/p cycle 23 Xgeva 120 mg subcutaneous every month  Past Therapy: Navelbine/Avastin q 2wk dosing - s/p cycle 4 - stopped due to progression  Tecentriq s/p cycle 4 Palliative radiation therapy to the neck    Interim History:  [name] is here today with her husband for follow-up and treatment. She is complaining of a great deal of lower back pain and leg pain she describes as aching and pulling as well as the same pain in her right lower ribcage. She states that she can't bend over. She tried the fentanyl patch but states that it gave her a headache and she took it off after 2 days. She has only been taking the Oxaydo twice a day.  No fever, chills, n/v, rash, dizziness, chest pain, palpitations, abdominal pain or changes in bowel habits.  She has the same intermittent dry cough. This has also contributed to her right lower ribcage pain. Lung sounds are clear throughout.  SOB with over exertion is unchanged. She will take breaks to rest as needed.  She takes a stool softener daily and fiber to help with occasional constipation.  She has had urinary urgency and frequency.   She has occasional puffiness in her feet and ankles that comes and goes.  She has maintained an appetite and is staying hydrated. Her weight is stable.   ECOG Performance Status: 1 - Symptomatic but completely ambulatory  Medications:  Allergies as of 07/27/2016      Reactions   Demerol Other (See Comments)   Syncope Passed out   Topamax Other (See Comments)   Had trouble walking   Ciprofloxacin Other (See Comments)   UNSPECIFIED REACTION    Avelox [moxifloxacin Hcl In Nacl] Other (See Comments)   Hypotension.     Mefoxin [cefoxitin Sodium In Dextrose] Rash      Medication List       Accurate as of 07/27/16 10:03 AM. Always use your most recent med list.          amLODipine-benazepril 5-10 MG capsule Commonly known as:  LOTREL Take 1 capsule by mouth daily.   budesonide-formoterol 160-4.5 MCG/ACT inhaler Commonly known as:  SYMBICORT Inhale 2 puffs into the lungs 2 (two) times daily as needed (SOB, wheezing).   cyclobenzaprine 5 MG tablet Commonly known as:  FLEXERIL Take 1 tablet (5 mg total) by mouth 3 (three) times daily as needed for muscle spasms.   diphenhydramine-acetaminophen 25-500 MG Tabs tablet Commonly known as:  TYLENOL PM Take 1 tablet by mouth at bedtime as needed (sleep).   fentaNYL 25 MCG/HR patch Commonly known as:  DURAGESIC - dosed mcg/hr Place 1 patch (25 mcg total) onto the skin every 3 (three) days.   HYDROcodone-homatropine 5-1.5 MG/5ML syrup Commonly known as:  HYCODAN Take 5 mLs by mouth every 6 (six) hours as needed for cough.   KLOR-CON M20 20 MEQ tablet Generic drug:  potassium chloride SA TAKE 1 TABLET BY MOUTH DAILY   lactulose 10 GM/15ML solution Commonly known as:  CHRONULAC Take 15 mLs (10 g total) by mouth daily. Reported on 07/22/2015   lidocaine-prilocaine cream Commonly known as:  EMLA Apply 1 application topically daily as needed (pain).   loratadine 10 MG tablet Commonly known as:  CLARITIN Take 10 mg by mouth daily as needed for allergies.   meloxicam 7.5 MG tablet Commonly known as:  MOBIC Take 1 tablet (7.5 mg total) by mouth as needed.   omeprazole 40 MG capsule Commonly known as:  PRILOSEC Take 40 mg by mouth daily as needed (heartburn).   OxyCODONE HCl (Abuse Deter) 5 MG Taba Commonly known as:  OXAYDO Take 5 mg by mouth every 4 (four) hours as needed (pain).   polyethylene glycol powder powder Commonly known as:  GLYCOLAX/MIRALAX Take 17 g by mouth once. Dissolve 1 capful of powder into any liquid and take once  daily. Can increase to 2 times daily if no effect after 3-4 days.   PROAIR RESPICLICK 481 (90 Base) MCG/ACT Aepb Generic drug:  Albuterol Sulfate Inhale 2 puffs into the lungs twice daily as needed for shortness of breath or wheezing   prochlorperazine 10 MG tablet Commonly known as:  COMPAZINE Take 10 mg by mouth every 6 (six) hours as needed for nausea or vomiting.   senna-docusate 8.6-50 MG tablet Commonly known as:  Senokot-S Take 1 tablet by mouth daily.   SUMAtriptan 100 MG tablet Commonly known as:  IMITREX Take 100 mg by mouth as needed for migraine or headache. Reported on 04/01/2015   vitamin B-12 1000 MCG tablet Commonly known as:  CYANOCOBALAMIN Take 1,000 mcg by mouth every morning.       Allergies:  Allergies  Allergen Reactions  . Demerol Other (See Comments)    Syncope Passed out  . Topamax Other (See Comments)    Had trouble walking  . Ciprofloxacin Other (See Comments)    UNSPECIFIED REACTION   . Avelox [Moxifloxacin Hcl In Nacl] Other (See Comments)    Hypotension.  . Mefoxin [Cefoxitin Sodium In Dextrose] Rash    Past Medical History, Surgical history, Social history, and Family History were reviewed and updated.  Review of Systems: All other 10 point review of systems is negative.   Physical Exam:  vitals were not taken for this visit.  Wt Readings from Last 3 Encounters:  07/27/16 134 lb 1.9 oz (60.8 kg)  07/06/16 133 lb (60.3 kg)  06/22/16 135 lb (61.2 kg)    Ocular: Sclerae unicteric, pupils equal, round and reactive to light Ear-nose-throat: Oropharynx clear, dentition fair Lymphatic: No cervical, supraclavicular or axillary adenopathy Lungs no rales or rhonchi, good excursion bilaterally Heart regular rate and rhythm, no murmur appreciated Abd soft, nontender, positive bowel sounds, no liver or spleen tip palpated on exam, no fluid wave  MSK no focal spinal tenderness, no joint edema Neuro: non-focal, well-oriented, appropriate  affect Breasts: Deferred   Lab Results  Component Value Date   WBC 6.5 07/27/2016   HGB 11.9 07/27/2016   HCT 37.0 07/27/2016   MCV 88 07/27/2016   PLT 224 07/27/2016   Lab Results  Component Value Date   FERRITIN 165 05/11/2016   IRON 57 05/11/2016   TIBC 264 05/11/2016   UIBC 207 05/11/2016   IRONPCTSAT 21 05/11/2016   Lab Results  Component Value Date   RETICCTPCT 1.4 08/13/2008   RBC 4.22 07/27/2016   RETICCTABS 61.6 08/13/2008   No results found for: KPAFRELGTCHN, LAMBDASER, KAPLAMBRATIO No results found for: IGGSERUM, IGA, IGMSERUM No results found for: Odetta Pink, SPEI   Chemistry      Component Value Date/Time   NA 136 07/06/2016 0955   NA 140 07/22/2015 1255   K 3.9 07/06/2016 0955  K 4.0 07/22/2015 1255   CL 101 07/06/2016 0955   CO2 27 07/06/2016 0955   CO2 27 07/22/2015 1255   BUN 9 07/06/2016 0955   BUN 9.8 07/22/2015 1255   CREATININE 0.4 (L) 07/06/2016 0955   CREATININE 0.6 07/22/2015 1255      Component Value Date/Time   CALCIUM 8.6 07/06/2016 0955   CALCIUM 9.1 07/22/2015 1255   ALKPHOS 72 07/06/2016 0955   AST 19 07/06/2016 0955   ALT 20 07/06/2016 0955   BILITOT 0.60 07/06/2016 0955      Impression and Plan: Ms. Hershkowitz is a pleasant 66 yo caucasian female with metastatic adenocarcinoma of the lung. Her recent PET scan showed improvement in disease. She has tolerated treatment with Abraxane and Avastin nicely. Unfortunately, her pain is not well controlled at this time.  We will scheduled her for an MRI of the lumbar spine this week to further evaluate for cause of worsening pain.  We will get her back on oxycontin and increase her dose to 20 mg PO q12h daily. She will also have Oxaydo 5 mg PO every 4 hrs for break through pain as needed.  Hycodan was also refilled for cough.  We will proceed with treatment today as planned per Dr. Marin Olp.  UA was positive for UTI. Culture was sent.  We will go ahead and get her onto an antibiotic today.  All questions were answered and they are in agreement with the plan.  She has her current treatment and appointment schedule. Both she and her husband know to contact our office with any questions or concerns. We can certainly see her sooner if need be.   Eliezer Bottom, NP 6/20/201810:03 AM

## 2016-07-30 ENCOUNTER — Ambulatory Visit (HOSPITAL_BASED_OUTPATIENT_CLINIC_OR_DEPARTMENT_OTHER): Payer: PPO

## 2016-07-31 LAB — URINE CULTURE

## 2016-08-03 ENCOUNTER — Ambulatory Visit: Payer: PPO | Admitting: Hematology & Oncology

## 2016-08-03 ENCOUNTER — Other Ambulatory Visit: Payer: PPO

## 2016-08-03 ENCOUNTER — Ambulatory Visit: Payer: PPO

## 2016-08-06 ENCOUNTER — Ambulatory Visit (HOSPITAL_BASED_OUTPATIENT_CLINIC_OR_DEPARTMENT_OTHER)
Admission: RE | Admit: 2016-08-06 | Discharge: 2016-08-06 | Disposition: A | Discharge status: Home / Self Care | Payer: PPO | Source: Ambulatory Visit | Attending: Family | Admitting: Family

## 2016-08-06 DIAGNOSIS — M25441 Effusion, right hand: Secondary | ICD-10-CM | POA: Insufficient documentation

## 2016-08-06 DIAGNOSIS — M5441 Lumbago with sciatica, right side: Secondary | ICD-10-CM

## 2016-08-06 DIAGNOSIS — M5126 Other intervertebral disc displacement, lumbar region: Secondary | ICD-10-CM | POA: Diagnosis not present

## 2016-08-06 DIAGNOSIS — C3490 Malignant neoplasm of unspecified part of unspecified bronchus or lung: Secondary | ICD-10-CM | POA: Diagnosis not present

## 2016-08-06 DIAGNOSIS — G8929 Other chronic pain: Secondary | ICD-10-CM | POA: Diagnosis not present

## 2016-08-06 DIAGNOSIS — C7951 Secondary malignant neoplasm of bone: Secondary | ICD-10-CM | POA: Insufficient documentation

## 2016-08-06 DIAGNOSIS — M25442 Effusion, left hand: Secondary | ICD-10-CM | POA: Diagnosis not present

## 2016-08-06 DIAGNOSIS — M5442 Lumbago with sciatica, left side: Secondary | ICD-10-CM

## 2016-08-06 MED ORDER — GADOBENATE DIMEGLUMINE 529 MG/ML IV SOLN
10.0000 mL | Freq: Once | INTRAVENOUS | Status: AC | PRN
  Administered 2016-08-06: 10 mL via INTRAVENOUS

## 2016-08-08 ENCOUNTER — Ambulatory Visit (HOSPITAL_BASED_OUTPATIENT_CLINIC_OR_DEPARTMENT_OTHER): Payer: PPO | Admitting: Family

## 2016-08-08 VITALS — BP 141/86 | HR 109 | Temp 98.2°F | Resp 20 | Wt 135.0 lb

## 2016-08-08 DIAGNOSIS — C7949 Secondary malignant neoplasm of other parts of nervous system: Secondary | ICD-10-CM

## 2016-08-08 DIAGNOSIS — C779 Secondary and unspecified malignant neoplasm of lymph node, unspecified: Secondary | ICD-10-CM

## 2016-08-08 DIAGNOSIS — C7951 Secondary malignant neoplasm of bone: Secondary | ICD-10-CM

## 2016-08-08 DIAGNOSIS — B370 Candidal stomatitis: Secondary | ICD-10-CM

## 2016-08-08 DIAGNOSIS — C3490 Malignant neoplasm of unspecified part of unspecified bronchus or lung: Secondary | ICD-10-CM | POA: Diagnosis not present

## 2016-08-08 DIAGNOSIS — J910 Malignant pleural effusion: Secondary | ICD-10-CM | POA: Diagnosis not present

## 2016-08-08 MED ORDER — DEXAMETHASONE 4 MG PO TABS: 8.0000 mg | ORAL_TABLET | Freq: Two times a day (BID) | ORAL | 1 refills | Status: AC

## 2016-08-08 MED ORDER — NYSTATIN 100000 UNIT/ML MT SUSP: 5.0000 mL | Freq: Four times a day (QID) | OROMUCOSAL | 2 refills | Status: AC

## 2016-08-08 NOTE — Progress Notes (Addendum)
Hematology and Oncology Follow Up Visit  [name] [id number] Oct 20, 1950 66 y.o. 08/08/2016   Principle Diagnosis:  Metastatic adenocarcinoma the lung - bone metastases, lymph node metastasis and intrapulmonary metastasis Malignant left pleural effusion    Current Therapy:   Abraxane/Avastin - s/p cycle 24 Xgeva 120 mg subcutaneous every month   Interim History:  [name] is here today with her husband to go over her MRI results from this weekend. Her scan showed that she no has evidence of focal nodular enhancement along the distal cord and nerve root which suggests leptomeningeal disease. She is still having lower back pain that radiates down her legs. She has had weakness and feeling "off balance/staggering." She denies having any falls or syncopal episodes. Her husband is good to stay close by to support her.  She denies any loss of bowel or bladder function.  Her SOB with exertion is unchanged.  No fever, chills, n/v, cough, rash, dizziness, chest pain, palpitations, abdominal pain or changes in bowel or bladder habits.  She has maintained a good appetite and is staying well hydrated. Her weight is stable.   ECOG Performance Status: 2 - Symptomatic, <50% confined to bed  Medications:  Allergies as of 08/08/2016      Reactions   Demerol Other (See Comments)   Syncope Passed out   Topamax Other (See Comments)   Had trouble walking   Ciprofloxacin Other (See Comments)   UNSPECIFIED REACTION    Avelox [moxifloxacin Hcl In Nacl] Other (See Comments)   Hypotension.   Mefoxin [cefoxitin Sodium In Dextrose] Rash      Medication List       Accurate as of 08/08/16  3:40 PM. Always use your most recent med list.          amLODipine-benazepril 5-10 MG capsule Commonly known as:  LOTREL Take 1 capsule by mouth daily.   budesonide-formoterol 160-4.5 MCG/ACT inhaler Commonly known as:  SYMBICORT Inhale 2 puffs into the lungs 2 (two) times daily as needed (SOB, wheezing).   cyclobenzaprine 5 MG tablet Commonly known as:  FLEXERIL Take 1 tablet (5 mg total) by mouth 3 (three) times daily as needed for muscle spasms.   dexamethasone 4 MG tablet Commonly known as:  DECADRON Take 2 tablets (8 mg total) by mouth 2 (two) times daily.   diphenhydramine-acetaminophen 25-500 MG Tabs tablet Commonly known as:  TYLENOL PM Take 1 tablet by mouth at bedtime as needed (sleep).   fentaNYL 25 MCG/HR patch Commonly known as:  DURAGESIC - dosed mcg/hr Place 1 patch (25 mcg total) onto the skin every 3 (three) days.   HYDROcodone-homatropine 5-1.5 MG/5ML syrup Commonly known as:  HYCODAN Take 5 mLs by mouth every 6 (six) hours as needed for cough.   KLOR-CON M20 20 MEQ tablet Generic drug:  potassium chloride SA TAKE 1 TABLET BY MOUTH DAILY   lactulose 10 GM/15ML solution Commonly known as:  CHRONULAC Take 15 mLs (10 g total) by mouth daily. Reported on 07/22/2015   lidocaine-prilocaine cream Commonly known as:  EMLA Apply 1 application topically daily as needed (pain).   loratadine 10 MG tablet Commonly known as:  CLARITIN Take 10 mg by mouth daily as needed for allergies.   meloxicam 7.5 MG tablet Commonly known as:  MOBIC Take 1 tablet (7.5 mg total) by mouth as needed.   nystatin 100000 UNIT/ML suspension Commonly known as:  MYCOSTATIN Take 5 mLs (500,000 Units total) by mouth 4 (four) times daily.   omeprazole 40  MG capsule Commonly known as:  PRILOSEC Take 40 mg by mouth daily as needed (heartburn).   OxyCODONE HCl (Abuse Deter) 5 MG Taba Commonly known as:  OXAYDO Take 5 mg by mouth every 4 (four) hours as needed (pain).   oxyCODONE 20 mg 12 hr tablet Commonly known as:  OXYCONTIN Take 1 tablet (20 mg total) by mouth every 12 (twelve) hours.   polyethylene glycol powder powder Commonly known as:  GLYCOLAX/MIRALAX Take 17 g by mouth once. Dissolve 1 capful of powder into any liquid and take once daily. Can increase to 2 times daily if no  effect after 3-4 days.   PROAIR RESPICLICK 458 (90 Base) MCG/ACT Aepb Generic drug:  Albuterol Sulfate Inhale 2 puffs into the lungs twice daily as needed for shortness of breath or wheezing   prochlorperazine 10 MG tablet Commonly known as:  COMPAZINE Take 10 mg by mouth every 6 (six) hours as needed for nausea or vomiting.   senna-docusate 8.6-50 MG tablet Commonly known as:  Senokot-S Take 1 tablet by mouth daily.   sulfamethoxazole-trimethoprim 800-160 MG tablet Commonly known as:  BACTRIM DS,SEPTRA DS Take 1 tablet by mouth 2 (two) times daily.   SUMAtriptan 100 MG tablet Commonly known as:  IMITREX Take 100 mg by mouth as needed for migraine or headache. Reported on 04/01/2015   vitamin B-12 1000 MCG tablet Commonly known as:  CYANOCOBALAMIN Take 1,000 mcg by mouth every morning.       Allergies:  Allergies  Allergen Reactions  . Demerol Other (See Comments)    Syncope Passed out  . Topamax Other (See Comments)    Had trouble walking  . Ciprofloxacin Other (See Comments)    UNSPECIFIED REACTION   . Avelox [Moxifloxacin Hcl In Nacl] Other (See Comments)    Hypotension.  . Mefoxin [Cefoxitin Sodium In Dextrose] Rash    Past Medical History, Surgical history, Social history, and Family History were reviewed and updated.  Review of Systems: All other 10 point review of systems is negative.   Physical Exam:  weight is 135 lb (61.2 kg). Her oral temperature is 98.2 F (36.8 C). Her blood pressure is 141/86 (abnormal) and her pulse is 109 (abnormal). Her respiration is 20 and oxygen saturation is 95%.   Wt Readings from Last 3 Encounters:  08/08/16 135 lb (61.2 kg)  07/27/16 134 lb 1.9 oz (60.8 kg)  07/06/16 133 lb (60.3 kg)    Ocular: Sclerae unicteric, pupils equal, round and reactive to light Ear-nose-throat: Oropharynx clear, dentition fair Lymphatic: No cervical, supraclavicular or axillary adenopathy Lungs no rales or rhonchi, good excursion  bilaterally Heart regular rate and rhythm, no murmur appreciated Abd soft, nontender, positive bowel sounds, no liver or spleen tip palpated on exam, no fluid wave MSK tenderness along the lumbar spine, no joint edema Neuro: non-focal, well-oriented, appropriate affect Breasts: Deferred   Lab Results  Component Value Date   WBC 6.5 07/27/2016   HGB 11.9 07/27/2016   HCT 37.0 07/27/2016   MCV 88 07/27/2016   PLT 224 07/27/2016   Lab Results  Component Value Date   FERRITIN 165 05/11/2016   IRON 57 05/11/2016   TIBC 264 05/11/2016   UIBC 207 05/11/2016   IRONPCTSAT 21 05/11/2016   Lab Results  Component Value Date   RETICCTPCT 1.4 08/13/2008   RBC 4.22 07/27/2016   RETICCTABS 61.6 08/13/2008   No results found for: KPAFRELGTCHN, LAMBDASER, KAPLAMBRATIO No results found for: IGGSERUM, IGA, IGMSERUM No results found for:  Odetta Pink, SPEI   Chemistry      Component Value Date/Time   NA 137 07/27/2016 0927   NA 140 07/22/2015 1255   K 3.3 07/27/2016 0927   K 4.0 07/22/2015 1255   CL 105 07/27/2016 0927   CO2 24 07/27/2016 0927   CO2 27 07/22/2015 1255   BUN 10 07/27/2016 0927   BUN 9.8 07/22/2015 1255   CREATININE 0.6 07/27/2016 0927   CREATININE 0.6 07/22/2015 1255      Component Value Date/Time   CALCIUM 8.8 07/27/2016 0927   CALCIUM 9.1 07/22/2015 1255   ALKPHOS 89 (H) 07/27/2016 0927   AST 24 07/27/2016 0927   ALT 19 07/27/2016 0927   BILITOT 0.60 07/27/2016 0927      Impression and Plan: Ms. Hebdon is a very pleasant 66 yo caucasian female with metastatic lung cancer that now appears to have invaded the spinal cord with leptomeningeal disease on Saturday's MRI. She is still having a great deal of lower back pain and weakness in her legs causing issues with mobility and balance. She denies bowel or bladder incontinence at this time.  She will start Decadron 8 mg PO BID today to help with inflammation and  also nystatin swish and swallow for prevention of oral candidiasis. She is taking Prilosec 40 mg PO daily.  She has received palliative radiation to the neck with Dr. Sondra Come in the past so we will refer her back to see him tomorrow for assessment and treatment of the spinal cord.  We will also get MRIs or the thoracic and cervical spine as well as the brain to assess for any other areas of metastasis.  We will plan to see her back in another 2 weeks for repeat lab work and follow-up.  Both she and her sweet husband are good to contact our office with any questions or concerns. We can certainly see her sooner if need be.   Eliezer Bottom, NP 7/2/20183:40 PM   ADDENDUM:  This was a shared visit with Kamrin Spath. This is a tough situation now. She now appears to have CNS involvement by leptomeningeal disease by her MRI. I think the pain that she's having with some radiating discomfort in her legs is indicative of this.  There is no bowel or bladder incontinence. She does not have any obvious weakness in the legs. Her husband says that she is somewhat unstable.  It is unusual that she has somewhat improved disease on her last PET scan. I suppose that it is no surprise that the disease is now trying to get into her CNS. Per AB I think she is going to need to have a MRI of the brain and cervical/thoracic spine.  I have spoken with Dr. Sondra Come of radiation oncology. He will see her quickly to get started on radiation.  We will get her on steroids. We will have her take 8 mg twice a day. I did this will help with some of the pain and radiculopathy.  I have been treating her for 12 years. Again she is done incredibly well.  Hopefully, we will not see anything additional on her MRI that she will have this week.  We spent about 45 minutes with she and her husband. I have known Ms. Lotter for a long time. She has a very strong faith. She does so much for her family. I really want to make sure that she  continues to have good quality of life.  Lattie Haw, MD

## 2016-08-08 NOTE — Progress Notes (Signed)
Histology and Location of Primary Cancer: metastatic adenocarcinoma of the lung  Location(s) of Symptomatic Metastases: lumbar spine  Past/Anticipated chemotherapy by medical oncology, if any: Abraxane/Avastin - s/p cycle 23. Xgeva 120 mg subcutaneous every month. She tells me that Dr. Ennver plans to stop chemotherapy until she completes radiation.   Pain on a scale of 0-10 is: 7/10. She tells me it starts in her back, radiates to her RIght Hip and Right Leg.   If Spine Met(s), symptoms, if any, include:  Bowel/Bladder retention or incontinence (please describe): She reports constipation.   Numbness or weakness in extremities (please describe):  She denies  Current Decadron regimen, if applicable: takes 8 mg BID  Ambulatory status? Walker? Wheelchair?: She is ambulatory at home, but using a wheelchair today.   SAFETY ISSUES:  Prior radiation? 07/03/15-07/16/15 - C3 spine 30 Gy  Pacemaker/ICD? No  Possible current pregnancy? no  Is the patient on methotrexate? No  Current Complaints / other details:    BP 128/89   Pulse 92   Temp 98 F (36.7 C)   Ht 5' 1" (1.549 m)   Wt 138 lb 9.6 oz (62.9 kg)   SpO2 99% Comment: room air  BMI 26.19 kg/m    

## 2016-08-09 ENCOUNTER — Ambulatory Visit (HOSPITAL_COMMUNITY): Payer: PPO

## 2016-08-09 ENCOUNTER — Ambulatory Visit (HOSPITAL_COMMUNITY)
Admission: RE | Admit: 2016-08-09 | Discharge: 2016-08-09 | Disposition: A | Discharge status: Home / Self Care | Payer: PPO | Source: Ambulatory Visit | Attending: Family | Admitting: Family

## 2016-08-09 DIAGNOSIS — C7949 Secondary malignant neoplasm of other parts of nervous system: Secondary | ICD-10-CM | POA: Diagnosis not present

## 2016-08-09 DIAGNOSIS — B370 Candidal stomatitis: Secondary | ICD-10-CM | POA: Insufficient documentation

## 2016-08-09 DIAGNOSIS — C7951 Secondary malignant neoplasm of bone: Secondary | ICD-10-CM | POA: Insufficient documentation

## 2016-08-09 DIAGNOSIS — C3490 Malignant neoplasm of unspecified part of unspecified bronchus or lung: Secondary | ICD-10-CM | POA: Diagnosis not present

## 2016-08-09 DIAGNOSIS — M545 Low back pain: Secondary | ICD-10-CM | POA: Diagnosis not present

## 2016-08-09 MED ORDER — GADOBENATE DIMEGLUMINE 529 MG/ML IV SOLN
15.0000 mL | Freq: Once | INTRAVENOUS | Status: AC | PRN
  Administered 2016-08-09: 12 mL via INTRAVENOUS

## 2016-08-10 IMAGING — NM NM BONE WHOLE BODY
2 series · 2 of 2 positions shown · non-contrast
Comparison: PET-CT 03/17/2010

CLINICAL DATA: Metastatic lung cancer.  Left shoulder pain.

EXAM:
NUCLEAR MEDICINE WHOLE BODY BONE SCAN
TECHNIQUE: Whole body anterior and posterior images were obtained approximately
3 hours after intravenous injection of radiopharmaceutical.
RADIOPHARMACEUTICALS:  25.7 mCi Hechnetium-ZZm MDP IV

[Series 1: whole body · 2.66mm/px · 1 of 1 slices shown]
[im 1/1]
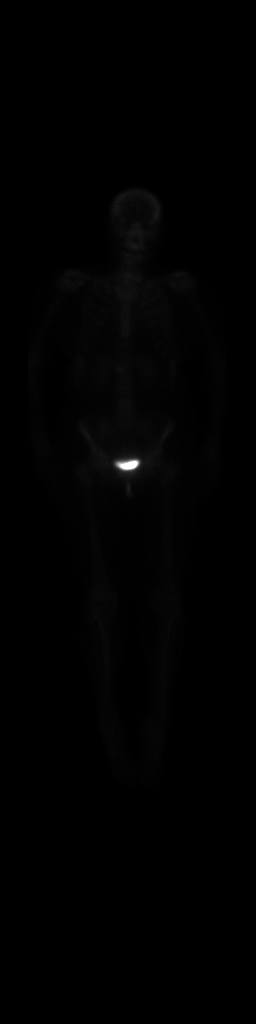

[Series 1: [id number] whole body · 2.66mm/px · 1 of 1 slices shown]
[im 1/1]
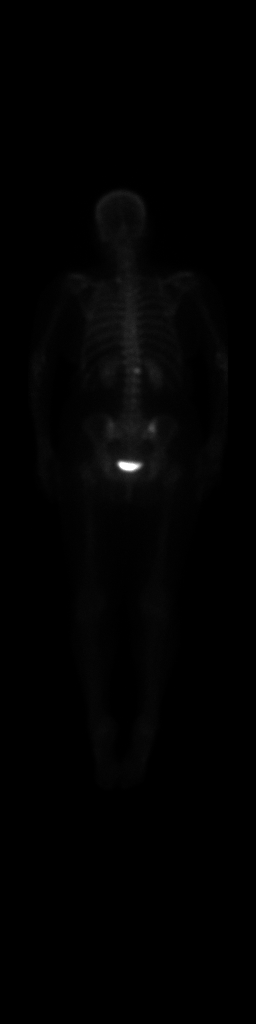

[2 of 2 positions shown; findings below may reference images not displayed]

FINDINGS: Bilateral renal function and excretion is present. Increased
activity noted over the right approximately T12 vertebral body.
Thoracolumbar spine series suggested for further evaluation. No
other mild increased activity also noted over the right SI joint.
Pelvic series suggested for further evaluation. Punctate area of
increased activity noted left C7/posterior left first rib region.
This could account for left shoulder pain. Cervical spine series and
left rib series suggested for further evaluation.a
IMPRESSION: 1. Punctate area of increased activity in the right T12 region.
Thoracolumbar spine series suggest for further evaluation.

2. Focal area of increased activity in the right SI joint region.
Pelvic series suggested for further evaluation.

3. Subtle punctate area of increased activity left C7 /posterior
left first rib region. This could account for left shoulder
symptoms. Cervical spine series and left rib series suggested for
further evaluation.

## 2016-08-11 ENCOUNTER — Ambulatory Visit
Admission: RE | Admit: 2016-08-11 | Discharge: 2016-08-11 | Disposition: A | Discharge status: Home / Self Care | Payer: PPO | Source: Ambulatory Visit | Attending: Radiation Oncology | Admitting: Radiation Oncology

## 2016-08-11 ENCOUNTER — Encounter: Payer: Self-pay | Admitting: Radiation Oncology

## 2016-08-11 VITALS — BP 128/89 | HR 92 | Temp 98.0°F | Ht 61.0 in | Wt 138.6 lb

## 2016-08-11 DIAGNOSIS — Z79891 Long term (current) use of opiate analgesic: Secondary | ICD-10-CM | POA: Insufficient documentation

## 2016-08-11 DIAGNOSIS — C7949 Secondary malignant neoplasm of other parts of nervous system: Secondary | ICD-10-CM | POA: Diagnosis not present

## 2016-08-11 DIAGNOSIS — J910 Malignant pleural effusion: Secondary | ICD-10-CM | POA: Insufficient documentation

## 2016-08-11 DIAGNOSIS — Z85118 Personal history of other malignant neoplasm of bronchus and lung: Secondary | ICD-10-CM | POA: Diagnosis not present

## 2016-08-11 DIAGNOSIS — C3490 Malignant neoplasm of unspecified part of unspecified bronchus or lung: Secondary | ICD-10-CM | POA: Insufficient documentation

## 2016-08-11 DIAGNOSIS — Z510 Encounter for antineoplastic radiation therapy: Secondary | ICD-10-CM | POA: Diagnosis not present

## 2016-08-11 DIAGNOSIS — Z79899 Other long term (current) drug therapy: Secondary | ICD-10-CM | POA: Insufficient documentation

## 2016-08-11 DIAGNOSIS — C7951 Secondary malignant neoplasm of bone: Secondary | ICD-10-CM | POA: Insufficient documentation

## 2016-08-11 DIAGNOSIS — Z923 Personal history of irradiation: Secondary | ICD-10-CM | POA: Diagnosis not present

## 2016-08-11 DIAGNOSIS — Z881 Allergy status to other antibiotic agents status: Secondary | ICD-10-CM | POA: Insufficient documentation

## 2016-08-11 DIAGNOSIS — Z85830 Personal history of malignant neoplasm of bone: Secondary | ICD-10-CM | POA: Diagnosis not present

## 2016-08-11 NOTE — Progress Notes (Signed)
Radiation Oncology         ([phone]) [phone] ________________________________  Name: [name] MRN: [id number]  Date: 08/11/2016  DOB: 1950/05/12  Follow-Up Visit Note  CC: Myrlene Broker, MD  Volanda Napoleon, MD    ICD-10-CM   1. 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 Bone metastasis ([place]) C79.51   3. Primary malignant neoplasm of lung metastatic to other site, unspecified laterality (HCC) C34.90     Diagnosis: Metastatic adenocarcinoma the lung - bone metastases, lymph node metastasis and intrapulmonary metastasis. Malignant left pleural effusion.    Interval Since Last Radiation: 13 months. The patient received palliative radiation therapy to the C3 cervical spine  Narrative:  Patient returns today for a  follow-up . The patient complains of pain in the lower thoracic/lumbar spine area and on a pain scale of 0-10 is: 7/10. She explains that the pain starts in her lower back and radiates to her right hip and right leg. The patient is here to discuss radiation therapy.  The patient presents today with her husband.  Patient recently completed extensive scanning including a MRI of the brain thoracic lumbosacral spine MRI. Findings were consistent with leptomeningeal involvement.                      ALLERGIES:  is allergic to demerol; topamax; ciprofloxacin; avelox [moxifloxacin hcl in nacl]; and mefoxin [cefoxitin sodium in dextrose].  Meds: Current Outpatient Prescriptions  Medication Sig Dispense Refill  . amLODipine-benazepril (LOTREL) 5-10 MG capsule Take 1 capsule by mouth daily. 90 capsule 1  . budesonide-formoterol (SYMBICORT) 160-4.5 MCG/ACT inhaler Inhale 2 puffs into the lungs 2 (two) times daily as needed (SOB, wheezing).     . cyclobenzaprine (FLEXERIL) 5 MG tablet Take 1 tablet (5 mg total) by mouth 3 (three) times daily as needed for muscle spasms. 60 tablet 2  . dexamethasone (DECADRON) 4 MG tablet Take 2 tablets (8 mg total) by mouth 2 (two)  times daily. 120 tablet 1  . diphenhydramine-acetaminophen (TYLENOL PM) 25-500 MG TABS tablet Take 1 tablet by mouth at bedtime as needed (sleep).    Marland Kitchen HYDROcodone-homatropine (HYCODAN) 5-1.5 MG/5ML syrup Take 5 mLs by mouth every 6 (six) hours as needed for cough. 240 mL 0  . KLOR-CON M20 20 MEQ tablet TAKE 1 TABLET BY MOUTH DAILY 30 tablet 3  . lactulose (CHRONULAC) 10 GM/15ML solution Take 15 mLs (10 g total) by mouth daily. Reported on 07/22/2015 240 mL 6  . lidocaine-prilocaine (EMLA) cream Apply 1 application topically daily as needed (pain).     Marland Kitchen loratadine (CLARITIN) 10 MG tablet Take 10 mg by mouth daily as needed for allergies.    . meloxicam (MOBIC) 7.5 MG tablet Take 1 tablet (7.5 mg total) by mouth as needed. 30 tablet 3  . nystatin (MYCOSTATIN) 100000 UNIT/ML suspension Take 5 mLs (500,000 Units total) by mouth 4 (four) times daily. 473 mL 2  . omeprazole (PRILOSEC) 40 MG capsule Take 40 mg by mouth daily as needed (heartburn).   3  . OxyCODONE HCl, Abuse Deter, (OXAYDO) 5 MG TABA Take 5 mg by mouth every 4 (four) hours as needed (pain). 90 tablet 0  . polyethylene glycol powder (GLYCOLAX/MIRALAX) powder Take 17 g by mouth once. Dissolve 1 capful of powder into any liquid and take once daily. Can increase to 2 times daily if no effect after 3-4 days. (Patient taking differently: Take 17 g by mouth daily. Dissolve 1 capful  of powder into any liquid and take once daily. Can increase to 2 times daily if no effect after 3-4 days.) 500 g 0  . PROAIR RESPICLICK 948 (90 BASE) MCG/ACT AEPB Inhale 2 puffs into the lungs twice daily as needed for shortness of breath or wheezing  0  . prochlorperazine (COMPAZINE) 10 MG tablet Take 10 mg by mouth every 6 (six) hours as needed for nausea or vomiting.     . senna-docusate (SENOKOT-S) 8.6-50 MG tablet Take 1 tablet by mouth daily. (Patient taking differently: Take 1 tablet by mouth at bedtime as needed for mild constipation. ) 30 tablet 0  .  SUMAtriptan (IMITREX) 100 MG tablet Take 100 mg by mouth as needed for migraine or headache. Reported on 04/01/2015    . vitamin B-12 (CYANOCOBALAMIN) 1000 MCG tablet Take 1,000 mcg by mouth every morning.    . fentaNYL (DURAGESIC - DOSED MCG/HR) 25 MCG/HR patch Place 1 patch (25 mcg total) onto the skin every 3 (three) days. (Patient not taking: Reported on 08/11/2016) 10 patch 0  . oxyCODONE (OXYCONTIN) 20 mg 12 hr tablet Take 1 tablet (20 mg total) by mouth every 12 (twelve) hours. (Patient not taking: Reported on 08/11/2016) 60 tablet 0   No current facility-administered medications for this encounter.    Facility-Administered Medications Ordered in Other Encounters  Medication Dose Route Frequency Provider Last Rate Last Dose  . morphine 4 MG/ML injection 2 mg  2 mg Intravenous Q2H PRN Volanda Napoleon, MD   2 mg at 07/22/15 1418  . sodium chloride 0.9 % injection 10 mL  10 mL Intravenous PRN Golden Pop, FNP   10 mL at 12/06/13 1149   REVIEW OF SYSTEMS: A 10+ POINT REVIEW OF SYSTEMS WAS OBTAINED including neurology, dermatology, psychiatry, cardiac, respiratory, lymph, extremities, GI, GU, musculoskeletal, constitutional, reproductive, HEENT. The patient complains of constipation. All pertinent positives are noted in the HPI. All others are negative. She has had some constipation but denies in the urinary or fecal incontinence. The patient has been unsteady with walking but denies any focal weakness in her lower extremities. Denies any blurred vision or double vision  Physical Findings: The patient is in no acute distress. Patient is alert and oriented. Remains in a wheel chair for the examination  height is 5\' 1"  (1.549 m) and weight is 138 lb 9.6 oz (62.9 kg). Her temperature is 98 F (36.7 C). Her blood pressure is 128/89 and her pulse is 92. Her oxygen saturation is 99%. .  No significant changes. General: Alert and oriented, pain in lower back and legs HEENT: Head is normocephalic.  Extraocular movements are intact. Oropharynx is clear. Neck: Neck is supple, no palpable cervical or supraclavicular lymphadenopathy. Heart: Regular in rate and rhythm with no murmurs, rubs, or gallops. Chest: Clear to auscultation bilaterally, with no rhonchi, wheezes, or rales.  Abdomen: Soft, nontender, nondistended, with no rigidity or guarding. Extremities: No cyanosis or edema. Lymphatics: see Neck Exam Skin: No concerning lesions. Musculoskeletal: symmetric strength and muscle tone throughout. Neurologic: Cranial nerves II through XII are grossly intact. No obvious focalities. Speech is fluent. Coordination is intact. Psychiatric: Judgment and insight are intact. Affect is appropriate.  Lab Findings: Lab Results  Component Value Date   WBC 6.5 07/27/2016   HGB 11.9 07/27/2016   HCT 37.0 07/27/2016   MCV 88 07/27/2016   PLT 224 07/27/2016    Radiographic Findings: Mr Jeri Cos NI Contrast  Result Date: 08/09/2016 CLINICAL DATA:  66 year old female  with metastatic lung cancer. Known osseous metastatic disease. Recent lumbar MRI suggesting cauda equina leptomeningeal metastases. Low back pain and leg pain.  Undergoing chemotherapy. EXAM: MRI HEAD WITHOUT AND WITH CONTRAST MRI CERVICAL SPINE WITHOUT AND WITH CONTRAST MRI THORACIC SPINE WITHOUT AND WITH CONTRAST TECHNIQUE: Multiplanar, multiecho pulse sequences of the brain and surrounding structures, cervical spine, and thoracic spine were obtained without and with intravenous contrast. CONTRAST:  12 mL MultiHance COMPARISON:  Cervical spine MRI 06/11/2016 and earlier. Brain MRI 11/18/2014. Lumbar MRI 08/06/2016. PET-CT 07/05/2016. Chest CT 04/04/2015. FINDINGS: MRI HEAD FINDINGS Brain: Post-contrast images demonstrate no convincing abnormal enhancement in the posterior fossa, but there are numerous new small abnormal foci of abnormal T2 and FLAIR hyperintensity about the brainstem and cerebellum (series 6 images 4 and 7, series 7, image 6).  A similar somewhat miliary nodular appearance of abnormal FLAIR signal is also noted about some surfaces of each hemisphere (series 7 images 18 through 22). On sagittal post-contrast images there is subtle suspicious leptomeningeal enhancement in the occipital lobes (series 35, image 11). And there is suspicion of abnormal subependymal enhancement at the atrium of the right lateral ventricle (image 9). No ventriculomegaly or intraventricular debris. No focal vasogenic edema in the brain. Chronic postoperative encephalomalacia in the right superior frontal gyrus with regional T2 and FLAIR hyperintensity appears stable. No pachymeningeal thickening. No intracranial mass effect. Patent basilar cisterns. No restricted diffusion or evidence of acute infarction. No acute intracranial hemorrhage identified. Stable mild postoperative blood products at the surgical site. Negative pituitary. No extra-axial collection. Vascular: Major intracranial vascular flow voids are preserved. Skull and upper cervical spine: Cervical spine findings are below. Calvarium bone marrow signal is stable and within normal limits. Sinuses/Orbits:  Stable and negative. Other: Visible internal auditory structures appear normal. Mastoids remain well pneumatized. Stable scalp soft tissues. MRI CERVICAL SPINE FINDINGS Alignment: Stable since 2017. Vertebrae: Chronic widespread sclerotic osseous metastatic disease. Minimal associated marrow edema in C7 and T1. Cord: No definite cervical spinal cord signal abnormality. No convincing leptomeningeal or dural thickening in the cervical spine. Posterior Fossa, vertebral arteries, paraspinal tissues: Brain findings are above. Chronic right level IIa rounded soft tissue mass, possibly metastatic lymph node. Major vascular flow voids in the neck are preserved. No new neck soft tissue mass identified. Disc levels: Stable cervical spine degeneration. MRI THORACIC SPINE FINDINGS Alignment: Thoracic vertebral  height and alignment appears stable since 2017. Mildly exaggerated thoracic kyphosis. Vertebrae: Diffuse osseous metastatic disease in the thoracic vertebra and scattered in the visible ribs. Scattered, speckled appearance of STIR hyperintensity/marrow edema, but no acute pathologic fracture suspected. Cord: Un like in the cervical spine there is abnormal nodularity and enhancement along the surface of the thoracic spinal cord from the T9 level inferiorly. Also, I note similar small round foci of abnormal T2 signal within that segment of the cord, reminiscent of the posterior fossa brain findings (series 24, images 6 and 7). No pachymeningeal thickening identified in the thoracic spine. Paraspinal tissues: Chronically abnormal left lower lobe. Chronic hiatal hernia. Decreased T2 signal in the liver and spleen suggesting hemosiderosis. Negative visualized posterior paraspinal soft tissues. Disc levels: Thoracic spine degeneration without spinal stenosis. IMPRESSION: 1. Positive for lower thoracic spinal cord and conus leptomeningeal metastatic disease. 2. Although there is little if any abnormal intracranial enhancement, widespread new abnormal FLAIR and T2 hyperintense foci along the surface of the cerebellum - and occasionally the cerebral hemispheres - is highly suspicious for disseminated intracranial leptomeningeal metastases. 3. As such, cervical  spine leptomeningeal disease is also strongly suspected although not readily demonstrable on these images. 4. No associated brain or spinal cord edema at this time. No intracranial or intraspinal mass effect. 5. Diffuse osseous metastatic disease throughout the spine. The skull remains relatively spared. Electronically Signed   By: Genevie Ann M.D.   On: 08/09/2016 17:14   Mr Cervical Spine W Wo Contrast  Result Date: 08/09/2016 CLINICAL DATA:  66 year old female with metastatic lung cancer. Known osseous metastatic disease. Recent lumbar MRI suggesting cauda equina  leptomeningeal metastases. Low back pain and leg pain.  Undergoing chemotherapy. EXAM: MRI HEAD WITHOUT AND WITH CONTRAST MRI CERVICAL SPINE WITHOUT AND WITH CONTRAST MRI THORACIC SPINE WITHOUT AND WITH CONTRAST TECHNIQUE: Multiplanar, multiecho pulse sequences of the brain and surrounding structures, cervical spine, and thoracic spine were obtained without and with intravenous contrast. CONTRAST:  12 mL MultiHance COMPARISON:  Cervical spine MRI 06/11/2016 and earlier. Brain MRI 11/18/2014. Lumbar MRI 08/06/2016. PET-CT 07/05/2016. Chest CT 04/04/2015. FINDINGS: MRI HEAD FINDINGS Brain: Post-contrast images demonstrate no convincing abnormal enhancement in the posterior fossa, but there are numerous new small abnormal foci of abnormal T2 and FLAIR hyperintensity about the brainstem and cerebellum (series 6 images 4 and 7, series 7, image 6). A similar somewhat miliary nodular appearance of abnormal FLAIR signal is also noted about some surfaces of each hemisphere (series 7 images 18 through 22). On sagittal post-contrast images there is subtle suspicious leptomeningeal enhancement in the occipital lobes (series 35, image 11). And there is suspicion of abnormal subependymal enhancement at the atrium of the right lateral ventricle (image 9). No ventriculomegaly or intraventricular debris. No focal vasogenic edema in the brain. Chronic postoperative encephalomalacia in the right superior frontal gyrus with regional T2 and FLAIR hyperintensity appears stable. No pachymeningeal thickening. No intracranial mass effect. Patent basilar cisterns. No restricted diffusion or evidence of acute infarction. No acute intracranial hemorrhage identified. Stable mild postoperative blood products at the surgical site. Negative pituitary. No extra-axial collection. Vascular: Major intracranial vascular flow voids are preserved. Skull and upper cervical spine: Cervical spine findings are below. Calvarium bone marrow signal is stable  and within normal limits. Sinuses/Orbits:  Stable and negative. Other: Visible internal auditory structures appear normal. Mastoids remain well pneumatized. Stable scalp soft tissues. MRI CERVICAL SPINE FINDINGS Alignment: Stable since 2017. Vertebrae: Chronic widespread sclerotic osseous metastatic disease. Minimal associated marrow edema in C7 and T1. Cord: No definite cervical spinal cord signal abnormality. No convincing leptomeningeal or dural thickening in the cervical spine. Posterior Fossa, vertebral arteries, paraspinal tissues: Brain findings are above. Chronic right level IIa rounded soft tissue mass, possibly metastatic lymph node. Major vascular flow voids in the neck are preserved. No new neck soft tissue mass identified. Disc levels: Stable cervical spine degeneration. MRI THORACIC SPINE FINDINGS Alignment: Thoracic vertebral height and alignment appears stable since 2017. Mildly exaggerated thoracic kyphosis. Vertebrae: Diffuse osseous metastatic disease in the thoracic vertebra and scattered in the visible ribs. Scattered, speckled appearance of STIR hyperintensity/marrow edema, but no acute pathologic fracture suspected. Cord: Un like in the cervical spine there is abnormal nodularity and enhancement along the surface of the thoracic spinal cord from the T9 level inferiorly. Also, I note similar small round foci of abnormal T2 signal within that segment of the cord, reminiscent of the posterior fossa brain findings (series 24, images 6 and 7). No pachymeningeal thickening identified in the thoracic spine. Paraspinal tissues: Chronically abnormal left lower lobe. Chronic hiatal hernia. Decreased T2 signal in the  liver and spleen suggesting hemosiderosis. Negative visualized posterior paraspinal soft tissues. Disc levels: Thoracic spine degeneration without spinal stenosis. IMPRESSION: 1. Positive for lower thoracic spinal cord and conus leptomeningeal metastatic disease. 2. Although there is little  if any abnormal intracranial enhancement, widespread new abnormal FLAIR and T2 hyperintense foci along the surface of the cerebellum - and occasionally the cerebral hemispheres - is highly suspicious for disseminated intracranial leptomeningeal metastases. 3. As such, cervical spine leptomeningeal disease is also strongly suspected although not readily demonstrable on these images. 4. No associated brain or spinal cord edema at this time. No intracranial or intraspinal mass effect. 5. Diffuse osseous metastatic disease throughout the spine. The skull remains relatively spared. Electronically Signed   By: Genevie Ann M.D.   On: 08/09/2016 17:14   Mr Thoracic Spine W Wo Contrast  Result Date: 08/09/2016 CLINICAL DATA:  66 year old female with metastatic lung cancer. Known osseous metastatic disease. Recent lumbar MRI suggesting cauda equina leptomeningeal metastases. Low back pain and leg pain.  Undergoing chemotherapy. EXAM: MRI HEAD WITHOUT AND WITH CONTRAST MRI CERVICAL SPINE WITHOUT AND WITH CONTRAST MRI THORACIC SPINE WITHOUT AND WITH CONTRAST TECHNIQUE: Multiplanar, multiecho pulse sequences of the brain and surrounding structures, cervical spine, and thoracic spine were obtained without and with intravenous contrast. CONTRAST:  12 mL MultiHance COMPARISON:  Cervical spine MRI 06/11/2016 and earlier. Brain MRI 11/18/2014. Lumbar MRI 08/06/2016. PET-CT 07/05/2016. Chest CT 04/04/2015. FINDINGS: MRI HEAD FINDINGS Brain: Post-contrast images demonstrate no convincing abnormal enhancement in the posterior fossa, but there are numerous new small abnormal foci of abnormal T2 and FLAIR hyperintensity about the brainstem and cerebellum (series 6 images 4 and 7, series 7, image 6). A similar somewhat miliary nodular appearance of abnormal FLAIR signal is also noted about some surfaces of each hemisphere (series 7 images 18 through 22). On sagittal post-contrast images there is subtle suspicious leptomeningeal enhancement  in the occipital lobes (series 35, image 11). And there is suspicion of abnormal subependymal enhancement at the atrium of the right lateral ventricle (image 9). No ventriculomegaly or intraventricular debris. No focal vasogenic edema in the brain. Chronic postoperative encephalomalacia in the right superior frontal gyrus with regional T2 and FLAIR hyperintensity appears stable. No pachymeningeal thickening. No intracranial mass effect. Patent basilar cisterns. No restricted diffusion or evidence of acute infarction. No acute intracranial hemorrhage identified. Stable mild postoperative blood products at the surgical site. Negative pituitary. No extra-axial collection. Vascular: Major intracranial vascular flow voids are preserved. Skull and upper cervical spine: Cervical spine findings are below. Calvarium bone marrow signal is stable and within normal limits. Sinuses/Orbits:  Stable and negative. Other: Visible internal auditory structures appear normal. Mastoids remain well pneumatized. Stable scalp soft tissues. MRI CERVICAL SPINE FINDINGS Alignment: Stable since 2017. Vertebrae: Chronic widespread sclerotic osseous metastatic disease. Minimal associated marrow edema in C7 and T1. Cord: No definite cervical spinal cord signal abnormality. No convincing leptomeningeal or dural thickening in the cervical spine. Posterior Fossa, vertebral arteries, paraspinal tissues: Brain findings are above. Chronic right level IIa rounded soft tissue mass, possibly metastatic lymph node. Major vascular flow voids in the neck are preserved. No new neck soft tissue mass identified. Disc levels: Stable cervical spine degeneration. MRI THORACIC SPINE FINDINGS Alignment: Thoracic vertebral height and alignment appears stable since 2017. Mildly exaggerated thoracic kyphosis. Vertebrae: Diffuse osseous metastatic disease in the thoracic vertebra and scattered in the visible ribs. Scattered, speckled appearance of STIR  hyperintensity/marrow edema, but no acute pathologic fracture suspected. Cord: Un like  in the cervical spine there is abnormal nodularity and enhancement along the surface of the thoracic spinal cord from the T9 level inferiorly. Also, I note similar small round foci of abnormal T2 signal within that segment of the cord, reminiscent of the posterior fossa brain findings (series 24, images 6 and 7). No pachymeningeal thickening identified in the thoracic spine. Paraspinal tissues: Chronically abnormal left lower lobe. Chronic hiatal hernia. Decreased T2 signal in the liver and spleen suggesting hemosiderosis. Negative visualized posterior paraspinal soft tissues. Disc levels: Thoracic spine degeneration without spinal stenosis. IMPRESSION: 1. Positive for lower thoracic spinal cord and conus leptomeningeal metastatic disease. 2. Although there is little if any abnormal intracranial enhancement, widespread new abnormal FLAIR and T2 hyperintense foci along the surface of the cerebellum - and occasionally the cerebral hemispheres - is highly suspicious for disseminated intracranial leptomeningeal metastases. 3. As such, cervical spine leptomeningeal disease is also strongly suspected although not readily demonstrable on these images. 4. No associated brain or spinal cord edema at this time. No intracranial or intraspinal mass effect. 5. Diffuse osseous metastatic disease throughout the spine. The skull remains relatively spared. Electronically Signed   By: Genevie Ann M.D.   On: 08/09/2016 17:14   Mr Lumbar Spine W Wo Contrast  Result Date: 08/06/2016 CLINICAL DATA:  Low back pain and bilateral leg pain, progressively worsening. History of lung cancer with bone metastases. Undergoing chemotherapy. EXAM: MRI LUMBAR SPINE WITHOUT AND WITH CONTRAST TECHNIQUE: Multiplanar and multiecho pulse sequences of the lumbar spine were obtained without and with intravenous contrast. CONTRAST:  67mL MULTIHANCE GADOBENATE DIMEGLUMINE  529 MG/ML IV SOLN COMPARISON:  04/26/2013 MRI.  Multiple PET scans. FINDINGS: Segmentation:  5 lumbar type vertebral bodies. Alignment:  Normal Vertebrae: Sclerotic metastatic disease throughout the entire region. No levels are spared. No evidence of extraosseous tumor however. No to tumor causing canal compromise. Nodular enhancement along the distal surface of the cord in the nerve roots likely indicates leptomeningeal carcinomatosis. Conus medullaris: Extends to the L1 level . Paraspinal and other soft tissues: Negative Disc levels: Non-compressive disc bulges at T12-L1 and L1-2. Wide patency of the spinal canal and foramina throughout the region. Mild lower lumbar facet osteoarthritis. IMPRESSION: Sclerotic metastatic disease affecting the entire regional skeleton. No evidence of dominant lesion or extraosseous extension. No tumor encroachment upon the canal. Evidence of focal nodular enhancement along the distal cord and nerve roots suggest leptomeningeal disease. Electronically Signed   By: Nelson Chimes M.D.   On: 08/06/2016 14:49    Impression/Plan: Metastatic adenocarcinoma the lung - bone metastases, lymph node metastasis and intrapulmonary metastasis. Malignant left pleural effusion.  Patient now has leptomeningeal involvement. Her pain is primarily in the lower back lumbosacral area pain radiation into the right lower extremity. Her symptoms are likely resulting from her leptomeningeal disease. She would be a candidate for palliative radiation therapy directed at the lower thoracic lumbosacral spine region. I discussed course of treatment side effects and potential toxicities of radiation therapy with the patient and her husband. She wishes to proceed with treatment. I did discuss with the patient that her radiation field will be lengthy and will require a 3 week course of treatment. She may expect to have nausea and diarrhea. Given the patient's overall performance status and poor prognosis with these  MRI findings I discussed that she can stop her radiation therapy at any time if becomes too difficult for her.   The patient has simulation at 9:00am today. A consent form was obtained.  Treatments to start early next week. Chemotherapy is on hold at this time. I spoke with Dr. Marin Olp today concerning other therapies. He doubts that she will be a potential candidate for intrathecal chemotherapy. ____________________________________ Gery Pray, MD  Blair Promise, PhD, MD  This document serves as a record of services personally performed by Gery Pray, MD. It was created on his behalf by Valeta Harms, a trained medical scribe. The creation of this record is based on the scribe's personal observations and the provider's statements to them. This document has been checked and approved by the attending provider.

## 2016-08-11 NOTE — Progress Notes (Signed)
  Radiation Oncology         [phone] ________________________________  Name: [name] MRN: [id number]  Date: 08/11/2016  DOB: February 13, 1950  SIMULATION AND TREATMENT PLANNING NOTE    ICD-10-CM   1. Bone metastasis ([facility]) C79.51   2. 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  Identity was confirmed.  All relevant records and images related to the planned course of therapy were reviewed.  The patient freely provided informed written consent to proceed with treatment after reviewing the details related to the planned course of therapy. The consent form was witnessed and verified by the simulation staff.  Then, the patient was set-up in a stable reproducible  supine position for radiation therapy.  CT images were obtained.  Surface markings were placed.  The CT images were loaded into the planning software.  Then the target and avoidance structures were contoured.  Treatment planning then occurred.  The radiation prescription was entered and confirmed.  Then, I designed and supervised the construction of a total of 3 medically necessary complex treatment devices.  I have requested : 3D Simulation  I have requested a DVH of the following structures: GTV, kidneys, spinal cord.  I have ordered:dose calc.  PLAN:  The patient will receive 30 Gy in 15 fractions.   Special treatment procedure: Additional time was taken in preparing the patient's current radiation fields. She has prior history of multiple courses of radiation therapy. Given the increased potential for toxicities as well as increased time in reviewing the patient's previous treatments, this constitutes a special treatment procedure.  -----------------------------------  Blair Promise, PhD, MD

## 2016-08-12 ENCOUNTER — Telehealth: Payer: Self-pay | Admitting: *Deleted

## 2016-08-12 ENCOUNTER — Other Ambulatory Visit: Payer: Self-pay | Admitting: *Deleted

## 2016-08-12 MED ORDER — OXYCODONE HCL ER 10 MG PO T12A: 10.0000 mg | EXTENDED_RELEASE_TABLET | Freq: Two times a day (BID) | ORAL | 0 refills | Status: AC

## 2016-08-12 NOTE — Telephone Encounter (Signed)
Patient is c/o constipation that has no resolved with OTC remedies. She would like something sent to her pharmacy.  Per Dr Marin Olp he would like patient to take lactulose every 4 hours until she has results and then return to once daily. Patient understands instructions with teach back.

## 2016-08-12 NOTE — Telephone Encounter (Signed)
Patient is c/o intense headache behind her L eye. She states the pain is making her sick on her stomach.   Reviewed with Dr Marin Olp. He wants patient to take OxyIR for pain and compazine for nausea. If the pain gets unbearable she can be seen in the ED.   She understands and will take a dose of oxyIR and compazine now to treat her symptoms. She understands to seek further assessment in the ED if the pain worsens or she develops neurological symptoms.

## 2016-08-15 ENCOUNTER — Ambulatory Visit
Admission: RE | Admit: 2016-08-15 | Discharge: 2016-08-15 | Disposition: A | Discharge status: Home / Self Care | Payer: PPO | Source: Ambulatory Visit | Attending: Radiation Oncology | Admitting: Radiation Oncology

## 2016-08-15 DIAGNOSIS — C7949 Secondary malignant neoplasm of other parts of nervous system: Secondary | ICD-10-CM

## 2016-08-15 DIAGNOSIS — C7951 Secondary malignant neoplasm of bone: Secondary | ICD-10-CM | POA: Diagnosis not present

## 2016-08-15 DIAGNOSIS — Z510 Encounter for antineoplastic radiation therapy: Secondary | ICD-10-CM | POA: Diagnosis not present

## 2016-08-15 NOTE — Progress Notes (Signed)
  Radiation Oncology         [phone] ________________________________  Name: [name] MRN: [id number]  Date: 08/15/2016  DOB: 09-02-1950  Simulation Verification Note   Status: outpatient  NARRATIVE: The patient was brought to the treatment unit and placed in the planned treatment position. The clinical setup was verified. Then port films were obtained and uploaded to the radiation oncology medical record software.  The treatment beams were carefully compared against the planned radiation fields. The position location and shape of the radiation fields was reviewed. They targeted volume of tissue appears to be appropriately covered by the radiation beams. Organs at risk appear to be excluded as planned.  Based on my personal review, I approved the simulation verification. The patient's treatment will proceed as planned.  -----------------------------------  Blair Promise, PhD, MD

## 2016-08-16 ENCOUNTER — Telehealth: Payer: Self-pay | Admitting: Family

## 2016-08-16 ENCOUNTER — Ambulatory Visit
Admission: RE | Admit: 2016-08-16 | Discharge: 2016-08-16 | Disposition: A | Discharge status: Home / Self Care | Payer: PPO | Source: Ambulatory Visit | Attending: Radiation Oncology | Admitting: Radiation Oncology

## 2016-08-16 ENCOUNTER — Telehealth: Payer: Self-pay | Admitting: *Deleted

## 2016-08-16 ENCOUNTER — Ambulatory Visit (HOSPITAL_COMMUNITY)
Admission: RE | Admit: 2016-08-16 | Discharge: 2016-08-16 | Disposition: A | Discharge status: Home / Self Care | Payer: PPO | Source: Ambulatory Visit | Attending: Family | Admitting: Family

## 2016-08-16 ENCOUNTER — Other Ambulatory Visit: Payer: Self-pay | Admitting: Family

## 2016-08-16 DIAGNOSIS — C7951 Secondary malignant neoplasm of bone: Secondary | ICD-10-CM

## 2016-08-16 DIAGNOSIS — C3490 Malignant neoplasm of unspecified part of unspecified bronchus or lung: Secondary | ICD-10-CM

## 2016-08-16 DIAGNOSIS — Z510 Encounter for antineoplastic radiation therapy: Secondary | ICD-10-CM | POA: Diagnosis not present

## 2016-08-16 DIAGNOSIS — K5900 Constipation, unspecified: Secondary | ICD-10-CM | POA: Diagnosis not present

## 2016-08-16 DIAGNOSIS — M4722 Other spondylosis with radiculopathy, cervical region: Secondary | ICD-10-CM

## 2016-08-16 DIAGNOSIS — C3492 Malignant neoplasm of unspecified part of left bronchus or lung: Secondary | ICD-10-CM

## 2016-08-16 MED ORDER — SONAFINE EX EMUL
1.0000 "application " | Freq: Once | CUTANEOUS | Status: AC
  Administered 2016-08-16: 1 via TOPICAL

## 2016-08-16 MED ORDER — LACTULOSE 10 GM/15ML PO SOLN: 10.0000 g | ORAL | 6 refills | Status: AC

## 2016-08-16 NOTE — Telephone Encounter (Signed)
No answer. Will try again later. 

## 2016-08-16 NOTE — Telephone Encounter (Signed)
I left a message with call back number for Xray results. Will also try husbands phone.

## 2016-08-16 NOTE — Telephone Encounter (Signed)
Patient is continuing to have issues with constipation. After speaking to this office on Friday and increasing her lactulose, she was able to have good results Friday evening. Since that day however, she has not been able to have a bowel movement.   She states she continues the lactulose every four hours, is taking apple juice, prunes, fiber, and mineral oil. She denies nausea, vomiting or pain. She states she feels bloated and full.  Reviewed with Dr Marin Olp and he would like patient to have an abdominal xray when he goes in for her radiation appointment today at [facility].   Spoke with patient and she understands to go to radiology before her radiation appointment for an xray.

## 2016-08-17 ENCOUNTER — Other Ambulatory Visit: Payer: Self-pay | Admitting: Family

## 2016-08-17 ENCOUNTER — Other Ambulatory Visit (HOSPITAL_BASED_OUTPATIENT_CLINIC_OR_DEPARTMENT_OTHER): Payer: PPO

## 2016-08-17 ENCOUNTER — Ambulatory Visit: Payer: PPO

## 2016-08-17 ENCOUNTER — Ambulatory Visit
Admission: RE | Admit: 2016-08-17 | Discharge: 2016-08-17 | Disposition: A | Discharge status: Home / Self Care | Payer: PPO | Source: Ambulatory Visit | Attending: Radiation Oncology | Admitting: Radiation Oncology

## 2016-08-17 ENCOUNTER — Ambulatory Visit (HOSPITAL_BASED_OUTPATIENT_CLINIC_OR_DEPARTMENT_OTHER): Payer: PPO

## 2016-08-17 ENCOUNTER — Ambulatory Visit (HOSPITAL_BASED_OUTPATIENT_CLINIC_OR_DEPARTMENT_OTHER): Payer: PPO | Admitting: Hematology & Oncology

## 2016-08-17 ENCOUNTER — Encounter: Payer: Self-pay | Admitting: Hematology & Oncology

## 2016-08-17 VITALS — BP 114/74 | HR 91 | Temp 98.1°F | Resp 18 | Wt 133.0 lb

## 2016-08-17 DIAGNOSIS — C3491 Malignant neoplasm of unspecified part of right bronchus or lung: Secondary | ICD-10-CM

## 2016-08-17 DIAGNOSIS — Z7189 Other specified counseling: Secondary | ICD-10-CM | POA: Diagnosis not present

## 2016-08-17 DIAGNOSIS — J910 Malignant pleural effusion: Secondary | ICD-10-CM

## 2016-08-17 DIAGNOSIS — C7951 Secondary malignant neoplasm of bone: Secondary | ICD-10-CM

## 2016-08-17 DIAGNOSIS — C7800 Secondary malignant neoplasm of unspecified lung: Secondary | ICD-10-CM

## 2016-08-17 DIAGNOSIS — C779 Secondary and unspecified malignant neoplasm of lymph node, unspecified: Secondary | ICD-10-CM

## 2016-08-17 DIAGNOSIS — E860 Dehydration: Secondary | ICD-10-CM

## 2016-08-17 DIAGNOSIS — C3490 Malignant neoplasm of unspecified part of unspecified bronchus or lung: Secondary | ICD-10-CM

## 2016-08-17 DIAGNOSIS — Z510 Encounter for antineoplastic radiation therapy: Secondary | ICD-10-CM | POA: Diagnosis not present

## 2016-08-17 DIAGNOSIS — G8929 Other chronic pain: Secondary | ICD-10-CM

## 2016-08-17 DIAGNOSIS — C7932 Secondary malignant neoplasm of cerebral meninges: Secondary | ICD-10-CM

## 2016-08-17 DIAGNOSIS — M5442 Lumbago with sciatica, left side: Principal | ICD-10-CM

## 2016-08-17 DIAGNOSIS — M5441 Lumbago with sciatica, right side: Principal | ICD-10-CM

## 2016-08-17 HISTORY — DX: Other specified counseling: Z71.89

## 2016-08-17 LAB — CBC WITH DIFFERENTIAL (CANCER CENTER ONLY)
BASO#: 0 10*3/uL (ref 0.0–0.2)
BASO%: 0.1 % (ref 0.0–2.0)
EOS ABS: 0 10*3/uL (ref 0.0–0.5)
EOS%: 0 % (ref 0.0–7.0)
HCT: 40 % (ref 34.8–46.6)
HEMOGLOBIN: 13.3 g/dL (ref 11.6–15.9)
LYMPH#: 0.9 10*3/uL (ref 0.9–3.3)
LYMPH%: 8.3 % — ABNORMAL LOW (ref 14.0–48.0)
MCH: 28.4 pg (ref 26.0–34.0)
MCHC: 33.3 g/dL (ref 32.0–36.0)
MCV: 85 fL (ref 81–101)
MONO#: 0.9 10*3/uL (ref 0.1–0.9)
MONO%: 8.4 % (ref 0.0–13.0)
NEUT%: 83.2 % — ABNORMAL HIGH (ref 39.6–80.0)
NEUTROS ABS: 9.3 10*3/uL — AB (ref 1.5–6.5)
Platelets: 245 10*3/uL (ref 145–400)
RBC: 4.69 10*6/uL (ref 3.70–5.32)
RDW: 18.7 % — ABNORMAL HIGH (ref 11.1–15.7)
WBC: 11.1 10*3/uL — ABNORMAL HIGH (ref 3.9–10.0)

## 2016-08-17 LAB — CMP (CANCER CENTER ONLY)
ALBUMIN: 3.7 g/dL (ref 3.3–5.5)
ALK PHOS: 81 U/L (ref 26–84)
ALT(SGPT): 23 U/L (ref 10–47)
AST: 20 U/L (ref 11–38)
BILIRUBIN TOTAL: 0.7 mg/dL (ref 0.20–1.60)
BUN, Bld: 25 mg/dL — ABNORMAL HIGH (ref 7–22)
CO2: 25 mEq/L (ref 18–33)
CREATININE: 0.5 mg/dL — AB (ref 0.6–1.2)
Calcium: 8.4 mg/dL (ref 8.0–10.3)
Chloride: 101 mEq/L (ref 98–108)
Glucose, Bld: 146 mg/dL — ABNORMAL HIGH (ref 73–118)
Potassium: 3.7 mEq/L (ref 3.3–4.7)
SODIUM: 132 meq/L (ref 128–145)
Total Protein: 6.7 g/dL (ref 6.4–8.1)

## 2016-08-17 LAB — TECHNOLOGIST REVIEW CHCC SATELLITE

## 2016-08-17 LAB — LACTATE DEHYDROGENASE: LDH: 254 U/L — AB (ref 125–245)

## 2016-08-17 MED ORDER — FAMOTIDINE IN NACL 20-0.9 MG/50ML-% IV SOLN
40.0000 mg | Freq: Once | INTRAVENOUS | Status: AC
  Administered 2016-08-17: 40 mg via INTRAVENOUS

## 2016-08-17 MED ORDER — LORAZEPAM 2 MG/ML IJ SOLN
INTRAMUSCULAR | Status: AC
  Filled 2016-08-17: qty 1

## 2016-08-17 MED ORDER — KETOROLAC TROMETHAMINE 15 MG/ML IJ SOLN
30.0000 mg | Freq: Once | INTRAMUSCULAR | Status: AC
  Administered 2016-08-17: 30 mg via INTRAVENOUS

## 2016-08-17 MED ORDER — SODIUM CHLORIDE 0.9 % IV SOLN
Freq: Once | INTRAVENOUS | Status: AC
  Administered 2016-08-17: 12:00:00 via INTRAVENOUS

## 2016-08-17 MED ORDER — FAMOTIDINE IN NACL 20-0.9 MG/50ML-% IV SOLN
INTRAVENOUS | Status: AC
  Filled 2016-08-17: qty 100

## 2016-08-17 MED ORDER — KETOROLAC TROMETHAMINE 15 MG/ML IJ SOLN
INTRAMUSCULAR | Status: AC
  Filled 2016-08-17: qty 2

## 2016-08-17 MED ORDER — LORAZEPAM 2 MG/ML IJ SOLN
0.5000 mg | Freq: Once | INTRAMUSCULAR | Status: AC
Start: 2016-08-17 — End: 2016-08-17
  Administered 2016-08-17: 0.5 mg via INTRAVENOUS

## 2016-08-17 NOTE — Patient Instructions (Signed)
Dehydration, Adult Dehydration is when there is not enough fluid or water in your body. This happens when you lose more fluids than you take in. Dehydration can range from mild to very bad. It should be treated right away to keep it from getting very bad. Symptoms of mild dehydration may include:  Thirst.  Dry lips.  Slightly dry mouth.  Dry, warm skin.  Dizziness. Symptoms of moderate dehydration may include:  Very dry mouth.  Muscle cramps.  Dark pee (urine). Pee may be the color of tea.  Your body making less pee.  Your eyes making fewer tears.  Heartbeat that is uneven or faster than normal (palpitations).  Headache.  Light-headedness, especially when you stand up from sitting.  Fainting (syncope). Symptoms of very bad dehydration may include:  Changes in skin, such as: ? Cold and clammy skin. ? Blotchy (mottled) or pale skin. ? Skin that does not quickly return to normal after being lightly pinched and let go (poor skin turgor).  Changes in body fluids, such as: ? Feeling very thirsty. ? Your eyes making fewer tears. ? Not sweating when body temperature is high, such as in hot weather. ? Your body making very little pee.  Changes in vital signs, such as: ? Weak pulse. ? Pulse that is more than 100 beats a minute when you are sitting still. ? Fast breathing. ? Low blood pressure.  Other changes, such as: ? Sunken eyes. ? Cold hands and feet. ? Confusion. ? Lack of energy (lethargy). ? Trouble waking up from sleep. ? Short-term weight loss. ? Unconsciousness. Follow these instructions at home:  If told by your doctor, drink an ORS: ? Make an ORS by using instructions on the package. ? Start by drinking small amounts, about  cup (120 mL) every 5-10 minutes. ? Slowly drink more until you have had the amount that your doctor said to have.  Drink enough clear fluid to keep your pee clear or pale yellow. If you were told to drink an ORS, finish the ORS  first, then start slowly drinking clear fluids. Drink fluids such as: ? Water. Do not drink only water by itself. Doing that can make the salt (sodium) level in your body get too low (hyponatremia). ? Ice chips. ? Fruit juice that you have added water to (diluted). ? Low-calorie sports drinks.  Avoid: ? Alcohol. ? Drinks that have a lot of sugar. These include high-calorie sports drinks, fruit juice that does not have water added, and soda. ? Caffeine. ? Foods that are greasy or have a lot of fat or sugar.  Take over-the-counter and prescription medicines only as told by your doctor.  Do not take salt tablets. Doing that can make the salt level in your body get too high (hypernatremia).  Eat foods that have minerals (electrolytes). Examples include bananas, oranges, potatoes, tomatoes, and spinach.  Keep all follow-up visits as told by your doctor. This is important. Contact a doctor if:  You have belly (abdominal) pain that: ? Gets worse. ? Stays in one area (localizes).  You have a rash.  You have a stiff neck.  You get angry or annoyed more easily than normal (irritability).  You are more sleepy than normal.  You have a harder time waking up than normal.  You feel: ? Weak. ? Dizzy. ? Very thirsty.  You have peed (urinated) only a small amount of very dark pee during 6-8 hours. Get help right away if:  You have symptoms of   very bad dehydration.  You cannot drink fluids without throwing up (vomiting).  Your symptoms get worse with treatment.  You have a fever.  You have a very bad headache.  You are throwing up or having watery poop (diarrhea) and it: ? Gets worse. ? Does not go away.  You have blood or something green (bile) in your throw-up.  You have blood in your poop (stool). This may cause poop to look black and tarry.  You have not peed in 6-8 hours.  You pass out (faint).  Your heart rate when you are sitting still is more than 100 beats a  minute.  You have trouble breathing. This information is not intended to replace advice given to you by your health care provider. Make sure you discuss any questions you have with your health care provider. Document Released: 11/20/2008 Document Revised: 08/14/2015 Document Reviewed: 03/20/2015 Elsevier Interactive Patient Education  2018 [employer]  

## 2016-08-17 NOTE — Patient Instructions (Signed)
Implanted Port Home Guide An implanted port is a type of central line that is placed under the skin. Central lines are used to provide IV access when treatment or nutrition needs to be given through a person's veins. Implanted ports are used for long-term IV access. An implanted port may be placed because:  You need IV medicine that would be irritating to the small veins in your hands or arms.  You need long-term IV medicines, such as antibiotics.  You need IV nutrition for a long period.  You need frequent blood draws for lab tests.  You need dialysis.  Implanted ports are usually placed in the chest area, but they can also be placed in the upper arm, the abdomen, or the leg. An implanted port has two main parts:  Reservoir. The reservoir is round and will appear as a small, raised area under your skin. The reservoir is the part where a needle is inserted to give medicines or draw blood.  Catheter. The catheter is a thin, flexible tube that extends from the reservoir. The catheter is placed into a large vein. Medicine that is inserted into the reservoir goes into the catheter and then into the vein.  How will I care for my incision site? Do not get the incision site wet. Bathe or shower as directed by your health care provider. How is my port accessed? Special steps must be taken to access the port:  Before the port is accessed, a numbing cream can be placed on the skin. This helps numb the skin over the port site.  Your health care provider uses a sterile technique to access the port. ? Your health care provider must put on a mask and sterile gloves. ? The skin over your port is cleaned carefully with an antiseptic and allowed to dry. ? The port is gently pinched between sterile gloves, and a needle is inserted into the port.  Only "non-coring" port needles should be used to access the port. Once the port is accessed, a blood return should be checked. This helps ensure that the port  is in the vein and is not clogged.  If your port needs to remain accessed for a constant infusion, a clear (transparent) bandage will be placed over the needle site. The bandage and needle will need to be changed every week, or as directed by your health care provider.  Keep the bandage covering the needle clean and dry. Do not get it wet. Follow your health care provider's instructions on how to take a shower or bath while the port is accessed.  If your port does not need to stay accessed, no bandage is needed over the port.  What is flushing? Flushing helps keep the port from getting clogged. Follow your health care provider's instructions on how and when to flush the port. Ports are usually flushed with saline solution or a medicine called heparin. The need for flushing will depend on how the port is used.  If the port is used for intermittent medicines or blood draws, the port will need to be flushed: ? After medicines have been given. ? After blood has been drawn. ? As part of routine maintenance.  If a constant infusion is running, the port may not need to be flushed.  How long will my port stay implanted? The port can stay in for as long as your health care provider thinks it is needed. When it is time for the port to come out, surgery will be   done to remove it. The procedure is similar to the one performed when the port was put in. When should I seek immediate medical care? When you have an implanted port, you should seek immediate medical care if:  You notice a bad smell coming from the incision site.  You have swelling, redness, or drainage at the incision site.  You have more swelling or pain at the port site or the surrounding area.  You have a fever that is not controlled with medicine.  This information is not intended to replace advice given to you by your health care provider. Make sure you discuss any questions you have with your health care provider. Document  Released: 01/24/2005 Document Revised: 07/02/2015 Document Reviewed: 10/01/2012 Elsevier Interactive Patient Education  2017 [employer]  

## 2016-08-17 NOTE — Progress Notes (Signed)
In a pool This is a  Hematology and Oncology Follow Up Visit  [name] [id number] [birth year] 66 y.o. 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 Unfortunately, she is clearly shown [place] that her disease history, much more aggressive. We recently found that she had leptomeningeal spread. She is complaining of a lot of pelvic and right leg pain. An MRI was done which showed some involvement of the nerve roots with features consistent with leptomeningeal disease.  She had an MRI of the brain and cervical spine. 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  She is started palliative radiation to the lumbar spine.  She comes in in a wheelchair. She is having some difficulties with constipation. There is no urinary retention. She following has been able to have a bowel movement.  She has had occasional pain with the left eye.  We had had her on chemotherapy with Abraxane/Avastin. We are going to go ahead and discontinue this now. Given that she has spread of her disease to the meninges, I don't see that systemic therapy is going to be of help.  I talked to she and her husband for about 45 minutes. I have known her for 12 years. She has done incredibly well with her disease.  We are at a point now and which we really need to focus on quality of life. We discussed her goals of care. We are in a situation where we really need to make sure that she has discomfort. She deserves respect and dignity.  We talked about  end-of-life issues. We talked about being kept alive on machines. She and her husband will talk about this. I explained to her that if she were to be put on life support, that she would not come off and that it would be after her family to take her off the machine. If she was put on life support, she also would have her cancer grow more quickly.  Currently, I set her performance status is ECOG 3.   Medications:  Allergies as of 08/17/2016      Reactions   Demerol Other (See Comments)   Syncope Passed out   Topamax Other (See Comments)   Had trouble walking   Ciprofloxacin Other (See Comments)   UNSPECIFIED REACTION    Avelox [moxifloxacin Hcl In Nacl] Other (See Comments)   Hypotension.   Mefoxin [cefoxitin Sodium In Dextrose] Rash      Medication List       Accurate as of 08/17/16 12:53 PM. Always use your most recent med list.          amLODipine-benazepril 5-10 MG capsule Commonly known as:  LOTREL Take 1 capsule by mouth daily.   budesonide-formoterol 160-4.5 MCG/ACT inhaler Commonly known as:  SYMBICORT Inhale 2 puffs into the lungs 2 (two) times daily as needed (SOB, wheezing).   cyclobenzaprine 5 MG tablet Commonly known as:  FLEXERIL Take 1 tablet (5 mg total) by mouth 3 (three) times daily as needed for muscle spasms.   dexamethasone 4 MG tablet Commonly known as:  DECADRON Take 2 tablets (  8 mg total) by mouth 2 (two) times daily.   diphenhydramine-acetaminophen 25-500 MG Tabs tablet Commonly known as:  TYLENOL PM Take 1 tablet by mouth at bedtime as needed (sleep).   fentaNYL 25 MCG/HR patch Commonly known as:  DURAGESIC - dosed mcg/hr Place 1 patch (25 mcg total) onto the skin every 3 (three) days.   HYDROcodone-homatropine 5-1.5 MG/5ML syrup Commonly known as:  HYCODAN Take 5 mLs by mouth every 6 (six) hours as needed for cough.   KLOR-CON M20 20 MEQ tablet Generic drug:  potassium chloride SA TAKE 1 TABLET BY MOUTH DAILY   lactulose 10 GM/15ML  solution Commonly known as:  CHRONULAC Take 15 mLs (10 g total) by mouth every 4 (four) hours. Until BM then daily   lidocaine-prilocaine cream Commonly known as:  EMLA Apply 1 application topically daily as needed (pain).   loratadine 10 MG tablet Commonly known as:  CLARITIN Take 10 mg by mouth daily as needed for allergies.   meloxicam 7.5 MG tablet Commonly known as:  MOBIC Take 1 tablet (7.5 mg total) by mouth as needed.   nystatin 100000 UNIT/ML suspension Commonly known as:  MYCOSTATIN Take 5 mLs (500,000 Units total) by mouth 4 (four) times daily.   omeprazole 40 MG capsule Commonly known as:  PRILOSEC Take 40 mg by mouth daily as needed (heartburn).   OxyCODONE HCl (Abuse Deter) 5 MG Taba Commonly known as:  OXAYDO Take 5 mg by mouth every 4 (four) hours as needed (pain).   oxyCODONE 10 mg 12 hr tablet Commonly known as:  OXYCONTIN Take 1 tablet (10 mg total) by mouth every 12 (twelve) hours.   polyethylene glycol powder powder Commonly known as:  GLYCOLAX/MIRALAX Take 17 g by mouth once. Dissolve 1 capful of powder into any liquid and take once daily. Can increase to 2 times daily if no effect after 3-4 days.   PROAIR RESPICLICK 338 (90 Base) MCG/ACT Aepb Generic drug:  Albuterol Sulfate Inhale 2 puffs into the lungs twice daily as needed for shortness of breath or wheezing   prochlorperazine 10 MG tablet Commonly known as:  COMPAZINE Take 10 mg by mouth every 6 (six) hours as needed for nausea or vomiting.   senna-docusate 8.6-50 MG tablet Commonly known as:  Senokot-S Take 1 tablet by mouth daily.   SONAFINE EX Apply topically.   SUMAtriptan 100 MG tablet Commonly known as:  IMITREX Take 100 mg by mouth as needed for migraine or headache. Reported on 04/01/2015   vitamin B-12 1000 MCG tablet Commonly known as:  CYANOCOBALAMIN Take 1,000 mcg by mouth every morning.       Allergies:  Allergies  Allergen Reactions  . Demerol Other (See  Comments)    Syncope Passed out  . Topamax Other (See Comments)    Had trouble walking  . Ciprofloxacin Other (See Comments)    UNSPECIFIED REACTION   . Avelox [Moxifloxacin Hcl In Nacl] Other (See Comments)    Hypotension.  . Mefoxin [Cefoxitin Sodium In Dextrose] Rash    Past Medical History, Surgical history, Social history, and Family History were reviewed and updated.  Review of Systems: All other 10 point review of systems is negative.   Physical Exam:  weight is 133 lb (60.3 kg). Her oral temperature is 98.1 F (36.7 C). Her blood pressure is 114/74 and her pulse is 91. Her respiration is 18 and oxygen saturation is 99%.   Wt Readings from Last 3 Encounters:  08/17/16 133 lb (60.3 kg)  08/11/16 138 lb 9.6 oz (62.9 kg)  08/08/16 135 lb (61.2 kg)    Well-developed and well-nourished white female in no obvious distress. Head and neck exam shows no ocular or oral lesions. There are no palpable cervical or supraclavicular lymph nodes. Lungs are clear on the right side. There is still some slight decrease breath sounds on the left side. Cardiac exam regular rate and rhythm with no murmurs, rubs or bruits. Abdomen is soft. She is good bowel sounds. There is no fluid wave. There is no palpable liver or spleen tip. Back exam shows no tenderness over the spine, ribs or hips. Extremities shows no clubbing, cyanosis or edema. Skin exam shows no rashes, ecchymoses or petechia. Neurological exam shows no focal neurological deficits.   Lab Results  Component Value Date   WBC 11.1 (H) 08/17/2016   HGB 13.3 08/17/2016   HCT 40.0 08/17/2016   MCV 85 08/17/2016   PLT 245 08/17/2016   Lab Results  Component Value Date   FERRITIN 165 05/11/2016   IRON 57 05/11/2016   TIBC 264 05/11/2016   UIBC 207 05/11/2016   IRONPCTSAT 21 05/11/2016   Lab Results  Component Value Date   RETICCTPCT 1.4 08/13/2008   RBC 4.69 08/17/2016   RETICCTABS 61.6 08/13/2008   No results found for:  KPAFRELGTCHN, LAMBDASER, KAPLAMBRATIO No results found for: IGGSERUM, IGA, IGMSERUM No results found for: Odetta Pink, SPEI   Chemistry      Component Value Date/Time   NA 132 08/17/2016 1059   NA 140 07/22/2015 1255   K 3.7 08/17/2016 1059   K 4.0 07/22/2015 1255   CL 101 08/17/2016 1059   CO2 25 08/17/2016 1059   CO2 27 07/22/2015 1255   BUN 25 (H) 08/17/2016 1059   BUN 9.8 07/22/2015 1255   CREATININE 0.5 (L) 08/17/2016 1059   CREATININE 0.6 07/22/2015 1255      Component Value Date/Time   CALCIUM 8.4 08/17/2016 1059   CALCIUM 9.1 07/22/2015 1255   ALKPHOS 81 08/17/2016 1059   AST 20 08/17/2016 1059   ALT 23 08/17/2016 1059   BILITOT 0.70 08/17/2016 1059     Impression and Plan: Ms. Nath is a pleasant 66 yo white female with metastatic adenocarcinoma of the lung. She has no "Driver" mutations that we can target.  Again, our goal at this point is quality of life. Hopefully the radiation to the lumbar spine will help. I'm not sure if radiation to her brain is going to be of any benefit. I will have to talk with Dr. Sondra Come of radiation oncology.  I did talk to her about hospice. I really think that hospice would be of benefit. I told her and her husband that hospice can be there whenever she needs them.  She would prefer not to have hospice at this point.  I told her that we can at least that hospice is no about her. They would not have to do anything at the present time but when the time does calm that her family will need help, hospice can really help out. She is agreeable to this.   For today, she is a little dehydrated. We will give her some IV fluids.  We will have her come back in about 3 weeks. By then, she will be finished with her radiation. We will see how she is doing.   I really hate it for Ms. Ohman. Again we have known her for several years.  She is family to [place]. We have been through the "ups and downs"  with her and all of the family issues that she has had to deal with.    Volanda Napoleon, MD 7/11/201812:53 PM

## 2016-08-18 ENCOUNTER — Ambulatory Visit
Admission: RE | Admit: 2016-08-18 | Discharge: 2016-08-18 | Disposition: A | Discharge status: Home / Self Care | Payer: PPO | Source: Ambulatory Visit | Attending: Radiation Oncology | Admitting: Radiation Oncology

## 2016-08-18 DIAGNOSIS — Z510 Encounter for antineoplastic radiation therapy: Secondary | ICD-10-CM | POA: Diagnosis not present

## 2016-08-18 DIAGNOSIS — C7951 Secondary malignant neoplasm of bone: Secondary | ICD-10-CM | POA: Diagnosis not present

## 2016-08-19 ENCOUNTER — Ambulatory Visit
Admission: RE | Admit: 2016-08-19 | Discharge: 2016-08-19 | Disposition: A | Discharge status: Home / Self Care | Payer: PPO | Source: Ambulatory Visit | Attending: Radiation Oncology | Admitting: Radiation Oncology

## 2016-08-19 DIAGNOSIS — Z510 Encounter for antineoplastic radiation therapy: Secondary | ICD-10-CM | POA: Diagnosis not present

## 2016-08-19 DIAGNOSIS — C7951 Secondary malignant neoplasm of bone: Secondary | ICD-10-CM | POA: Diagnosis not present

## 2016-08-22 ENCOUNTER — Ambulatory Visit
Admission: RE | Admit: 2016-08-22 | Discharge: 2016-08-22 | Disposition: A | Discharge status: Home / Self Care | Payer: PPO | Source: Ambulatory Visit | Attending: Radiation Oncology | Admitting: Radiation Oncology

## 2016-08-22 ENCOUNTER — Telehealth: Payer: Self-pay | Admitting: *Deleted

## 2016-08-22 ENCOUNTER — Other Ambulatory Visit: Payer: Self-pay | Admitting: *Deleted

## 2016-08-22 ENCOUNTER — Other Ambulatory Visit: Payer: PPO

## 2016-08-22 ENCOUNTER — Ambulatory Visit: Payer: PPO | Admitting: Hematology & Oncology

## 2016-08-22 DIAGNOSIS — Z510 Encounter for antineoplastic radiation therapy: Secondary | ICD-10-CM | POA: Diagnosis not present

## 2016-08-22 DIAGNOSIS — C7951 Secondary malignant neoplasm of bone: Secondary | ICD-10-CM | POA: Diagnosis not present

## 2016-08-22 DIAGNOSIS — G4701 Insomnia due to medical condition: Secondary | ICD-10-CM

## 2016-08-22 DIAGNOSIS — C3491 Malignant neoplasm of unspecified part of right bronchus or lung: Secondary | ICD-10-CM

## 2016-08-22 MED ORDER — OMEPRAZOLE 40 MG PO CPDR: 40.0000 mg | DELAYED_RELEASE_CAPSULE | Freq: Every day | ORAL | 3 refills | Status: AC | PRN

## 2016-08-22 NOTE — Telephone Encounter (Signed)
Patient's daughter is requesting that a Hospice referral be made. The family, with the patient has discussed options over the weekend and they are ready for Hospice support.   Family is requesting [facility].  Order sent to [facility] per Dr Marin Olp.

## 2016-08-23 ENCOUNTER — Ambulatory Visit
Admission: RE | Admit: 2016-08-23 | Discharge: 2016-08-23 | Disposition: A | Discharge status: Home / Self Care | Payer: PPO | Source: Ambulatory Visit | Attending: Radiation Oncology | Admitting: Radiation Oncology

## 2016-08-23 DIAGNOSIS — Z510 Encounter for antineoplastic radiation therapy: Secondary | ICD-10-CM | POA: Diagnosis not present

## 2016-08-23 DIAGNOSIS — C7951 Secondary malignant neoplasm of bone: Secondary | ICD-10-CM | POA: Diagnosis not present

## 2016-08-24 ENCOUNTER — Ambulatory Visit
Admission: RE | Admit: 2016-08-24 | Discharge: 2016-08-24 | Disposition: A | Discharge status: Home / Self Care | Payer: PPO | Source: Ambulatory Visit | Attending: Radiation Oncology | Admitting: Radiation Oncology

## 2016-08-24 DIAGNOSIS — C7951 Secondary malignant neoplasm of bone: Secondary | ICD-10-CM | POA: Diagnosis not present

## 2016-08-24 DIAGNOSIS — Z510 Encounter for antineoplastic radiation therapy: Secondary | ICD-10-CM | POA: Diagnosis not present

## 2016-08-25 ENCOUNTER — Ambulatory Visit
Admission: RE | Admit: 2016-08-25 | Discharge: 2016-08-25 | Disposition: A | Discharge status: Home / Self Care | Payer: PPO | Source: Ambulatory Visit | Attending: Radiation Oncology | Admitting: Radiation Oncology

## 2016-08-25 DIAGNOSIS — C7951 Secondary malignant neoplasm of bone: Secondary | ICD-10-CM | POA: Diagnosis not present

## 2016-08-25 DIAGNOSIS — Z510 Encounter for antineoplastic radiation therapy: Secondary | ICD-10-CM | POA: Diagnosis not present

## 2016-08-26 ENCOUNTER — Ambulatory Visit
Admission: RE | Admit: 2016-08-26 | Discharge: 2016-08-26 | Disposition: A | Discharge status: Home / Self Care | Payer: PPO | Source: Ambulatory Visit | Attending: Radiation Oncology | Admitting: Radiation Oncology

## 2016-08-26 DIAGNOSIS — C7951 Secondary malignant neoplasm of bone: Secondary | ICD-10-CM | POA: Diagnosis not present

## 2016-08-26 DIAGNOSIS — Z510 Encounter for antineoplastic radiation therapy: Secondary | ICD-10-CM | POA: Diagnosis not present

## 2016-08-29 ENCOUNTER — Ambulatory Visit
Admission: RE | Admit: 2016-08-29 | Discharge: 2016-08-29 | Disposition: A | Discharge status: Home / Self Care | Payer: PPO | Source: Ambulatory Visit | Attending: Radiation Oncology | Admitting: Radiation Oncology

## 2016-08-29 DIAGNOSIS — Z510 Encounter for antineoplastic radiation therapy: Secondary | ICD-10-CM | POA: Diagnosis not present

## 2016-08-29 DIAGNOSIS — C7951 Secondary malignant neoplasm of bone: Secondary | ICD-10-CM | POA: Diagnosis not present

## 2016-08-30 ENCOUNTER — Ambulatory Visit
Admission: RE | Admit: 2016-08-30 | Discharge: 2016-08-30 | Disposition: A | Discharge status: Home / Self Care | Payer: PPO | Source: Ambulatory Visit | Attending: Radiation Oncology | Admitting: Radiation Oncology

## 2016-08-30 DIAGNOSIS — Z510 Encounter for antineoplastic radiation therapy: Secondary | ICD-10-CM | POA: Diagnosis not present

## 2016-08-30 DIAGNOSIS — C7951 Secondary malignant neoplasm of bone: Secondary | ICD-10-CM | POA: Diagnosis not present

## 2016-08-31 ENCOUNTER — Ambulatory Visit
Admission: RE | Admit: 2016-08-31 | Discharge: 2016-08-31 | Disposition: A | Discharge status: Home / Self Care | Payer: PPO | Source: Ambulatory Visit | Attending: Radiation Oncology | Admitting: Radiation Oncology

## 2016-08-31 ENCOUNTER — Other Ambulatory Visit: Payer: Self-pay | Admitting: *Deleted

## 2016-08-31 DIAGNOSIS — D508 Other iron deficiency anemias: Secondary | ICD-10-CM

## 2016-08-31 DIAGNOSIS — R300 Dysuria: Secondary | ICD-10-CM

## 2016-08-31 DIAGNOSIS — Z510 Encounter for antineoplastic radiation therapy: Secondary | ICD-10-CM | POA: Diagnosis not present

## 2016-08-31 DIAGNOSIS — C7951 Secondary malignant neoplasm of bone: Secondary | ICD-10-CM

## 2016-08-31 DIAGNOSIS — C3491 Malignant neoplasm of unspecified part of right bronchus or lung: Secondary | ICD-10-CM

## 2016-08-31 MED ORDER — OXYCODONE HCL 5 MG PO TABA: 5.0000 mg | ORAL_TABLET | ORAL | 0 refills | Status: AC | PRN

## 2016-09-01 ENCOUNTER — Encounter: Payer: Self-pay | Admitting: Radiation Oncology

## 2016-09-01 ENCOUNTER — Telehealth: Payer: Self-pay | Admitting: Oncology

## 2016-09-01 ENCOUNTER — Ambulatory Visit
Admission: RE | Admit: 2016-09-01 | Discharge: 2016-09-01 | Disposition: A | Discharge status: Home / Self Care | Payer: PPO | Source: Ambulatory Visit | Attending: Radiation Oncology | Admitting: Radiation Oncology

## 2016-09-01 DIAGNOSIS — Z510 Encounter for antineoplastic radiation therapy: Secondary | ICD-10-CM | POA: Diagnosis not present

## 2016-09-01 DIAGNOSIS — C7951 Secondary malignant neoplasm of bone: Secondary | ICD-10-CM | POA: Diagnosis not present

## 2016-09-01 NOTE — Progress Notes (Signed)
  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     Indication for treatment:  Palliative       Radiation treatment dates:   08/15/2016 to 09/01/2016  Site/dose:   The T-Spine was treated to 28 Gy in 14 fractions at 2 Gy per fraction.   Beams/energy:   3-D // 10X, 15X  Narrative: The patient tolerated radiation treatment relatively well.  Her pain improved with treatment and she remained neurologically stable. However, in light of her severe fatigue, the patient wished to finish treatment a day early.  Plan: The patient has completed radiation treatment. The patient will return to radiation oncology clinic for routine followup in one month. I advised them to call or return sooner if they have any questions or concerns related to their recovery or treatment.  -----------------------------------  Blair Promise, PhD, MD  This document serves as a record of services personally performed by Gery Pray, MD. It was created on his behalf by Arlyce Harman, a trained medical scribe. The creation of this record is based on the scribe's personal observations and the provider's statements to them. This document has been checked and approved by the attending provider.

## 2016-09-01 NOTE — Telephone Encounter (Signed)
Mr. Shoe called asking why patient is being tapered off decadron and if they can stop treatments.  He said they are supposed to see Dr. Sondra Come today after treatment.  Advised him to have Floria come in for treatment today and to see Dr. Sondra Come afterwards to discuss the decadron taper and if she needs to finish treatment tomorrow.  He verbalized understanding and agreement.

## 2016-09-02 ENCOUNTER — Ambulatory Visit: Payer: PPO

## 2016-09-07 ENCOUNTER — Ambulatory Visit

## 2016-09-07 ENCOUNTER — Other Ambulatory Visit: Payer: Self-pay | Admitting: Family

## 2016-09-07 ENCOUNTER — Ambulatory Visit (HOSPITAL_BASED_OUTPATIENT_CLINIC_OR_DEPARTMENT_OTHER): Admitting: Hematology & Oncology

## 2016-09-07 ENCOUNTER — Other Ambulatory Visit (HOSPITAL_BASED_OUTPATIENT_CLINIC_OR_DEPARTMENT_OTHER)

## 2016-09-07 ENCOUNTER — Ambulatory Visit (HOSPITAL_BASED_OUTPATIENT_CLINIC_OR_DEPARTMENT_OTHER)

## 2016-09-07 VITALS — BP 108/83 | HR 89 | Resp 17

## 2016-09-07 VITALS — BP 92/69 | HR 82 | Temp 98.1°F | Resp 17 | Wt 128.0 lb

## 2016-09-07 DIAGNOSIS — K219 Gastro-esophageal reflux disease without esophagitis: Secondary | ICD-10-CM

## 2016-09-07 DIAGNOSIS — C7951 Secondary malignant neoplasm of bone: Secondary | ICD-10-CM | POA: Diagnosis not present

## 2016-09-07 DIAGNOSIS — C3491 Malignant neoplasm of unspecified part of right bronchus or lung: Secondary | ICD-10-CM

## 2016-09-07 DIAGNOSIS — C7800 Secondary malignant neoplasm of unspecified lung: Secondary | ICD-10-CM

## 2016-09-07 DIAGNOSIS — C3490 Malignant neoplasm of unspecified part of unspecified bronchus or lung: Secondary | ICD-10-CM

## 2016-09-07 DIAGNOSIS — Z95828 Presence of other vascular implants and grafts: Secondary | ICD-10-CM

## 2016-09-07 DIAGNOSIS — E860 Dehydration: Secondary | ICD-10-CM

## 2016-09-07 DIAGNOSIS — C779 Secondary and unspecified malignant neoplasm of lymph node, unspecified: Secondary | ICD-10-CM

## 2016-09-07 DIAGNOSIS — C7952 Secondary malignant neoplasm of bone marrow: Secondary | ICD-10-CM | POA: Diagnosis not present

## 2016-09-07 DIAGNOSIS — Z7189 Other specified counseling: Secondary | ICD-10-CM

## 2016-09-07 LAB — TECHNOLOGIST REVIEW CHCC SATELLITE

## 2016-09-07 LAB — CBC WITH DIFFERENTIAL (CANCER CENTER ONLY)
BASO#: 0.1 10*3/uL (ref 0.0–0.2)
BASO%: 0.5 % (ref 0.0–2.0)
EOS%: 0 % (ref 0.0–7.0)
Eosinophils Absolute: 0 10*3/uL (ref 0.0–0.5)
HCT: 41.4 % (ref 34.8–46.6)
HEMOGLOBIN: 14 g/dL (ref 11.6–15.9)
LYMPH#: 0.2 10*3/uL — ABNORMAL LOW (ref 0.9–3.3)
LYMPH%: 2 % — AB (ref 14.0–48.0)
MCH: 28.6 pg (ref 26.0–34.0)
MCHC: 33.8 g/dL (ref 32.0–36.0)
MCV: 85 fL (ref 81–101)
MONO#: 0.3 10*3/uL (ref 0.1–0.9)
MONO%: 3.6 % (ref 0.0–13.0)
NEUT#: 8.6 10*3/uL — ABNORMAL HIGH (ref 1.5–6.5)
NEUT%: 93.9 % — ABNORMAL HIGH (ref 39.6–80.0)
PLATELETS: 114 10*3/uL — AB (ref 145–400)
RBC: 4.9 10*6/uL (ref 3.70–5.32)
RDW: 18.7 % — ABNORMAL HIGH (ref 11.1–15.7)
WBC: 9.1 10*3/uL (ref 3.9–10.0)

## 2016-09-07 LAB — CMP (CANCER CENTER ONLY)
ALBUMIN: 3 g/dL — AB (ref 3.3–5.5)
ALK PHOS: 62 U/L (ref 26–84)
ALT(SGPT): 35 U/L (ref 10–47)
AST: 25 U/L (ref 11–38)
BUN, Bld: 27 mg/dL — ABNORMAL HIGH (ref 7–22)
CALCIUM: 7.9 mg/dL — AB (ref 8.0–10.3)
CHLORIDE: 96 meq/L — AB (ref 98–108)
CO2: 27 mEq/L (ref 18–33)
Creat: 0.8 mg/dl (ref 0.6–1.2)
Glucose, Bld: 188 mg/dL — ABNORMAL HIGH (ref 73–118)
Potassium: 4.1 mEq/L (ref 3.3–4.7)
Sodium: 134 mEq/L (ref 128–145)
TOTAL PROTEIN: 5.4 g/dL — AB (ref 6.4–8.1)
Total Bilirubin: 0.8 mg/dl (ref 0.20–1.60)

## 2016-09-07 LAB — LACTATE DEHYDROGENASE: LDH: 293 U/L — AB (ref 125–245)

## 2016-09-07 MED ORDER — HEPARIN SOD (PORK) LOCK FLUSH 100 UNIT/ML IV SOLN
500.0000 [IU] | Freq: Once | INTRAVENOUS | Status: AC
  Administered 2016-09-07: 500 [IU] via INTRAVENOUS
  Filled 2016-09-07: qty 5

## 2016-09-07 MED ORDER — FAMOTIDINE IN NACL 20-0.9 MG/50ML-% IV SOLN
40.0000 mg | Freq: Once | INTRAVENOUS | Status: AC
  Administered 2016-09-07: 40 mg via INTRAVENOUS

## 2016-09-07 MED ORDER — SODIUM CHLORIDE 0.9 % IV SOLN
Freq: Once | INTRAVENOUS | Status: AC
Start: 2016-09-07 — End: 2016-09-07
  Administered 2016-09-07: 11:00:00 via INTRAVENOUS

## 2016-09-07 MED ORDER — HYDROMORPHONE HCL 1 MG/ML IJ SOLN
INTRAMUSCULAR | Status: AC
  Filled 2016-09-07: qty 1

## 2016-09-07 MED ORDER — FAMOTIDINE IN NACL 20-0.9 MG/50ML-% IV SOLN: 40.0000 mg | Freq: Once | INTRAVENOUS | Status: DC

## 2016-09-07 MED ORDER — HYDROMORPHONE HCL 1 MG/ML IJ SOLN
1.0000 mg | INTRAMUSCULAR | Status: DC | PRN
  Administered 2016-09-07: 1 mg via INTRAVENOUS

## 2016-09-07 MED ORDER — PANTOPRAZOLE SODIUM 40 MG IV SOLR: 40.0000 mg | Freq: Once | INTRAVENOUS | Status: DC

## 2016-09-07 MED ORDER — FAMOTIDINE IN NACL 20-0.9 MG/50ML-% IV SOLN
INTRAVENOUS | Status: AC
  Filled 2016-09-07: qty 50

## 2016-09-07 MED ORDER — FAMOTIDINE IN NACL 20-0.9 MG/50ML-% IV SOLN
INTRAVENOUS | Status: AC
Start: 2016-09-07 — End: 2016-09-07
  Filled 2016-09-07: qty 50

## 2016-09-07 MED ORDER — SODIUM CHLORIDE 0.9% FLUSH
10.0000 mL | INTRAVENOUS | Status: DC | PRN
  Administered 2016-09-07: 10 mL via INTRAVENOUS
  Filled 2016-09-07: qty 10

## 2016-09-07 NOTE — Progress Notes (Signed)
In a pool This is a  Hematology and Oncology Follow Up Visit  [name] [id number] 1950/10/14 66 y.o. 09/07/2016   Principle Diagnosis:  Metastatic adenocarcinoma the lung - bone metastases, lymph node metastasis and intrapulmonary metastasis Malignant left pleural effusion Leptomeningeal carcinomatosis  Current Therapy:  AbraxaneAvastin -s/p cycle #19 - discontinued on 08/17/2016 Navelbine/Avastin q 2wk dosing - s/p c#4 - progression  Tecentriq s/p cycle 4 Xgeva 120 mg subcutaneous every month Palliative radiation therapy to the lumbar spine    Interim History:  [name] is here today for follow-up. She comes in much more weaker. She comes in a wheelchair. She is having a harder time walking. I'm sure this probably is reflective of her left toe meningeal disease.  Her appetite is less. She is losing weight. Her albumin has started to go down.  She's had no bleeding. His been no diarrhea. She is on stool softeners help with constipation.  She does have some pain. She is on a fairly good pain regimen right now. She does not wish to be on anything new. She is on oxycodone. She takes OxyContin at nighttime which else or rest.  Hospice is now seen her. They're doing a good job.  She's completed radiation therapy. She tolerated this pretty well.  Currently, I set her performance status is ECOG 3.   Medications:  Allergies as of 09/07/2016      Reactions   Demerol Other (See Comments)   Syncope Passed out   Topamax Other (See Comments)   Had trouble walking   Ciprofloxacin Other (See Comments)   UNSPECIFIED REACTION    Avelox [moxifloxacin Hcl In Nacl] Other (See Comments)   Hypotension.   Mefoxin [cefoxitin Sodium In Dextrose] Rash      Medication List       Accurate as of 09/07/16 10:07 AM. Always use your most recent med list.          amLODipine-benazepril 5-10 MG capsule Commonly known as:  LOTREL Take 1 capsule by mouth daily.   budesonide-formoterol  160-4.5 MCG/ACT inhaler Commonly known as:  SYMBICORT Inhale 2 puffs into the lungs 2 (two) times daily as needed (SOB, wheezing).   cyclobenzaprine 5 MG tablet Commonly known as:  FLEXERIL Take 1 tablet (5 mg total) by mouth 3 (three) times daily as needed for muscle spasms.   dexamethasone 4 MG tablet Commonly known as:  DECADRON Take 2 tablets (8 mg total) by mouth 2 (two) times daily.   diphenhydramine-acetaminophen 25-500 MG Tabs tablet Commonly known as:  TYLENOL PM Take 1 tablet by mouth at bedtime as needed (sleep).   fentaNYL 25 MCG/HR patch Commonly known as:  DURAGESIC - dosed mcg/hr Place 1 patch (25 mcg total) onto the skin every 3 (three) days.   HYDROcodone-homatropine 5-1.5 MG/5ML syrup Commonly known as:  HYCODAN Take 5 mLs by mouth every 6 (six) hours as needed for cough.   KLOR-CON M20 20 MEQ tablet Generic drug:  potassium chloride SA TAKE 1 TABLET BY MOUTH DAILY   lactulose 10 GM/15ML solution Commonly known as:  CHRONULAC Take 15 mLs (10 g total) by mouth every 4 (four) hours. Until BM then daily   lidocaine-prilocaine cream Commonly known as:  EMLA Apply 1 application topically daily as needed (pain).   loratadine 10 MG tablet Commonly known as:  CLARITIN Take 10 mg by mouth daily as needed for allergies.   meloxicam 7.5 MG tablet Commonly known as:  MOBIC Take 1 tablet (7.5 mg total)  by mouth as needed.   nystatin 100000 UNIT/ML suspension Commonly known as:  MYCOSTATIN Take 5 mLs (500,000 Units total) by mouth 4 (four) times daily.   omeprazole 40 MG capsule Commonly known as:  PRILOSEC Take 1 capsule (40 mg total) by mouth daily as needed (heartburn).   oxyCODONE 10 mg 12 hr tablet Commonly known as:  OXYCONTIN Take 1 tablet (10 mg total) by mouth every 12 (twelve) hours.   OxyCODONE HCl (Abuse Deter) 5 MG Taba Commonly known as:  OXAYDO Take 5 mg by mouth every 4 (four) hours as needed (pain).   polyethylene glycol powder  powder Commonly known as:  GLYCOLAX/MIRALAX Take 17 g by mouth once. Dissolve 1 capful of powder into any liquid and take once daily. Can increase to 2 times daily if no effect after 3-4 days.   PROAIR RESPICLICK 784 (90 Base) MCG/ACT Aepb Generic drug:  Albuterol Sulfate Inhale 2 puffs into the lungs twice daily as needed for shortness of breath or wheezing   prochlorperazine 10 MG tablet Commonly known as:  COMPAZINE Take 10 mg by mouth every 6 (six) hours as needed for nausea or vomiting.   senna-docusate 8.6-50 MG tablet Commonly known as:  Senokot-S Take 1 tablet by mouth daily.   SONAFINE EX Apply topically.   SUMAtriptan 100 MG tablet Commonly known as:  IMITREX Take 100 mg by mouth as needed for migraine or headache. Reported on 04/01/2015   vitamin B-12 1000 MCG tablet Commonly known as:  CYANOCOBALAMIN Take 1,000 mcg by mouth every morning.       Allergies:  Allergies  Allergen Reactions  . Demerol Other (See Comments)    Syncope Passed out  . Topamax Other (See Comments)    Had trouble walking  . Ciprofloxacin Other (See Comments)    UNSPECIFIED REACTION   . Avelox [Moxifloxacin Hcl In Nacl] Other (See Comments)    Hypotension.  . Mefoxin [Cefoxitin Sodium In Dextrose] Rash    Past Medical History, Surgical history, Social history, and Family History were reviewed and updated.  Review of Systems: All other 10 point review of systems is negative.   Physical Exam:  weight is 128 lb (58.1 kg). Her oral temperature is 98.1 F (36.7 C). Her blood pressure is 92/69 and her pulse is 82. Her respiration is 17.   Wt Readings from Last 3 Encounters:  09/07/16 128 lb (58.1 kg)  08/17/16 133 lb (60.3 kg)  08/11/16 138 lb 9.6 oz (62.9 kg)    Well-developed and well-nourished white female in no obvious distress. Head and neck exam shows no ocular or oral lesions. There are no palpable cervical or supraclavicular lymph nodes. Lungs are clear on the right side.  There is still some slight decrease breath sounds on the left side. Cardiac exam regular rate and rhythm with no murmurs, rubs or bruits. Abdomen is soft. She is good bowel sounds. There is no fluid wave. There is no palpable liver or spleen tip. Back exam shows no tenderness over the spine, ribs or hips. Extremities shows no clubbing, cyanosis or edema. Skin exam shows no rashes, ecchymoses or petechia. Neurological exam shows no focal neurological deficits.   Lab Results  Component Value Date   WBC 9.1 09/07/2016   HGB 14.0 09/07/2016   HCT 41.4 09/07/2016   MCV 85 09/07/2016   PLT 114 (L) 09/07/2016   Lab Results  Component Value Date   FERRITIN 165 05/11/2016   IRON 57 05/11/2016   TIBC 264  05/11/2016   UIBC 207 05/11/2016   IRONPCTSAT 21 05/11/2016   Lab Results  Component Value Date   RETICCTPCT 1.4 08/13/2008   RBC 4.90 09/07/2016   RETICCTABS 61.6 08/13/2008   No results found for: KPAFRELGTCHN, LAMBDASER, KAPLAMBRATIO No results found for: IGGSERUM, IGA, IGMSERUM No results found for: Odetta Pink, SPEI   Chemistry      Component Value Date/Time   NA 134 09/07/2016 0916   NA 140 07/22/2015 1255   K 4.1 09/07/2016 0916   K 4.0 07/22/2015 1255   CL 96 (L) 09/07/2016 0916   CO2 27 09/07/2016 0916   CO2 27 07/22/2015 1255   BUN 27 (H) 09/07/2016 0916   BUN 9.8 07/22/2015 1255   CREATININE 0.8 09/07/2016 0916   CREATININE 0.6 07/22/2015 1255      Component Value Date/Time   CALCIUM 7.9 (L) 09/07/2016 0916   CALCIUM 9.1 07/22/2015 1255   ALKPHOS 62 09/07/2016 0916   AST 25 09/07/2016 0916   ALT 35 09/07/2016 0916   BILITOT 0.80 09/07/2016 0916     Impression and Plan: Ms. Larner is a pleasant 66 yo white female with metastatic adenocarcinoma of the lung. She has no "Driver" mutations that we can target.  It is no surprise that she is declining. She is somewhat dehydrated. We will go ahead and give her some  IV fluids.  We will cut back on her Decadron. She is having reflux symptoms. We will taper her Decadron down pretty quickly.  Again, our goal now is quality of life. Our goal is comfort.  I just feel horrible that she is at this point. Again, I have known her for 13 years. She has done so well. She has fought so hard. There has been a lot of family issues during the time that I have known her. She has always been "lead" with her family to make sure that everybody is cared for. She has done a great job helping her mom who has had horrible back problems.   I will like to see her back in 3 weeks. I would be surprised if she made it through the month of August. This I did not tell her.   Volanda Napoleon, MD 8/1/201810:07 AM

## 2016-09-07 NOTE — Progress Notes (Unsigned)
Patient received Dilaudid and within 15 minutes became dizzy, "room spinning" and clammy. No SOB, chest pain, palpitations, n/v/d. We placed her on oxygen and are giving her Pepcid IV. She states that she is starting to feel better. We will continue to monitor.

## 2016-09-07 NOTE — Patient Instructions (Addendum)
Hydromorphone injection What is this medicine? HYDROMORPHONE (hye droe MOR fone) is a pain reliever. It is used to treat moderate to severe pain. This medicine may be used for other purposes; ask your health care provider or pharmacist if you have questions. COMMON BRAND NAME(S): Dilaudid, Dilaudid-HP, Simplist Dilaudid What should I tell my health care provider before I take this medicine? They need to know if you have any of these conditions: -brain tumor -drug abuse or addiction -head injury -heart disease -if you often drink alcohol -kidney disease -liver disease -lung or breathing disease, like asthma -problems urinating -seizures -stomach or intestine problems -an unusual or allergic reaction to hydromorphone, other medicines, foods, dyes, or preservatives -pregnant or trying to get pregnant -breast-feeding How should I use this medicine? This medicine is for injection into a vein, into a muscle, or under the skin. It is usually given by a health care professional in a hospital or clinic setting. In rare cases, you might get this medicine at home. You will be taught how to give this medicine. Use exactly as directed. Take your medicine at regular intervals. Do not take your medicine more often than directed. It is important that you put your used needles and syringes in a special sharps container. Do not put them in a trash can. If you do not have a sharps container, call your pharmacist or healthcare provider to get one. Talk to your pediatrician regarding the use of this medicine in children. Special care may be needed. Overdosage: If you think you have taken too much of this medicine contact a poison control center or emergency room at once. NOTE: This medicine is only for you. Do not share this medicine with others. What if I miss a dose? If you miss a dose, use it as soon as you can. If it is almost time for your next dose, use only that dose. Do not use double or extra  doses. What may interact with this medicine? This medicine may interact with the following medications: -alcohol -antihistamines for allergy, cough and cold -certain medicines for anxiety or sleep -certain medicines for depression like amitriptyline, fluoxetine, sertraline -certain medicines for seizures like phenobarbital, primidone -general anesthetics like halothane, isoflurane, methoxyflurane, propofol -local anesthetics like lidocaine, pramoxine, tetracaine -MAOIs like Carbex, Eldepryl, Marplan, Nardil, and Parnate -medicines that relax muscles for surgery -other narcotic medicines for pain or cough -phenothiazines like chlorpromazine, mesoridazine, prochlorperazine, thioridazine This list may not describe all possible interactions. Give your health care provider a list of all the medicines, herbs, non-prescription drugs, or dietary supplements you use. Also tell them if you smoke, drink alcohol, or use illegal drugs. Some items may interact with your medicine. What should I watch for while using this medicine? Tell your doctor or health care professional if your pain does not go away, if it gets worse, or if you have new or a different type of pain. You may develop tolerance to the medicine. Tolerance means that you will need a higher dose of the medicine for pain relief. Tolerance is normal and is expected if you take this medicine for a long time. Do not suddenly stop taking your medicine because you may develop a severe reaction. Your body becomes used to the medicine. This does NOT mean you are addicted. Addiction is a behavior related to getting and using a drug for a non-medical reason. If you have pain, you have a medical reason to take pain medicine. Your doctor will tell you how much medicine  to take. If your doctor wants you to stop the medicine, the dose will be slowly lowered over time to avoid any side effects. There are different types of narcotic medicines (opiates). If you  take more than one type at the same time or if you are taking another medicine that also causes drowsiness, you may have more side effects. Give your health care provider a list of all medicines you use. Your doctor will tell you how much medicine to take. Do not take more medicine than directed. Call emergency for help if you have problems breathing or unusual sleepiness. You may get drowsy or dizzy. Do not drive, use machinery, or do anything that needs mental alertness until you know how this medicine affects you. Do not stand or sit up quickly, especially if you are an older patient. This reduces the risk of dizzy or fainting spells. Alcohol may interfere with the effect of this medicine. Avoid alcoholic drinks. This medicine will cause constipation. Try to have a bowel movement at least every 2 to 3 days. If you do not have a bowel movement for 3 days, call your doctor or health care professional. Your mouth may get dry. Chewing sugarless gum or sucking hard candy, and drinking plenty of water may help. Contact your doctor if the problem does not go away or is severe. What side effects may I notice from receiving this medicine? Side effects that you should report to your doctor or health care professional as soon as possible: -allergic reactions like skin rash, itching or hives, swelling of the face, lips, or tongue -breathing problems -confusion -seizures -signs and symptoms of low blood pressure like dizziness; feeling faint or lightheaded, falls; unusually weak or tired -trouble passing urine or change in the amount of urine Side effects that usually do not require medical attention (report to your doctor or health care professional if they continue or are bothersome): -constipation -dry mouth -nausea, vomiting -tiredness This list may not describe all possible side effects. Call your doctor for medical advice about side effects. You may report side effects to FDA at 1-800-FDA-1088. Where  should I keep my medicine? Keep out of the reach of children. This medicine can be abused. Keep your medicine in a safe place to protect it from theft. Do not share this medicine with anyone. Selling or giving away this medicine is dangerous and against the law. If you are using this medicine at home, you will be instructed on how to store this medicine. This medicine may cause accidental overdose and death if it is taken by other adults, children, or pets. Flush any unused medicine down the toilet to reduce the chance of harm. Do not use the medicine after the expiration date. NOTE: This sheet is a summary. It may not cover all possible information. If you have questions about this medicine, talk to your doctor, pharmacist, or health care provider.  2018 Elsevier/Gold Standard (2014-10-17 12:59:15) Famotidine injection What is this medicine? FAMOTIDINE (fa MOE ti deen) is a type of antihistamine that blocks the release of stomach acid. It is used to treat stomach or intestinal ulcers. It can relieve ulcer pain and discomfort, and the heartburn from acid reflux. This medicine may be used for other purposes; ask your health care provider or pharmacist if you have questions. COMMON BRAND NAME(S): Pepcid What should I tell my health care provider before I take this medicine? They need to know if you have any of these conditions: -kidney or liver disease -  an unusual or allergic reaction to famotidine, other medicines, foods, dyes, or preservatives -pregnant or trying to get pregnant -breast-feeding How should I use this medicine? This medicine is for infusion into a vein. It is given by a health care professional in a hospital or clinic setting. Talk to your pediatrician regarding the use of this medicine in children. Special care may be needed. Overdosage: If you think you have taken too much of this medicine contact a poison control center or emergency room at once. NOTE: This medicine is only  for you. Do not share this medicine with others. What if I miss a dose? This does not apply. What may interact with this medicine? -delavirdine -itraconazole -ketoconazole This list may not describe all possible interactions. Give your health care provider a list of all the medicines, herbs, non-prescription drugs, or dietary supplements you use. Also tell them if you smoke, drink alcohol, or use illegal drugs. Some items may interact with your medicine. What should I watch for while using this medicine? Tell your doctor or health care professional if your condition does not start to get better or gets worse. Do not take with aspirin, ibuprofen, or other antiinflammatory medicines. These can aggravate your condition. Do not smoke cigarettes or drink alcohol. These increase irritation in your stomach and can increase the time it will take for ulcers to heal. Cigarettes and alcohol can also worsen acid reflux or heartburn. If you get black, tarry stools or vomit up what looks like coffee grounds, call your doctor or health care professional at once. You may have a bleeding ulcer. What side effects may I notice from receiving this medicine? Side effects that you should report to your doctor or health care professional as soon as possible: -allergic reactions like skin rash, itching or hives, swelling of the face, lips, or tongue -agitation, nervousness -confusion -hallucinations Side effects that usually do not require medical attention (report to your doctor or health care professional if they continue or are bothersome): -constipation -diarrhea -dizziness -headache This list may not describe all possible side effects. Call your doctor for medical advice about side effects. You may report side effects to FDA at 1-800-FDA-1088. Where should I keep my medicine? This medicine is given in a hospital or clinic. You will not be given this medicine to store at home. NOTE: This sheet is a summary.  It may not cover all possible information. If you have questions about this medicine, talk to your doctor, pharmacist, or health care provider.  2018 Elsevier/Gold Standard (2007-05-30 13:24:51)

## 2016-09-09 ENCOUNTER — Other Ambulatory Visit: Payer: Self-pay

## 2016-09-09 MED ORDER — PANTOPRAZOLE SODIUM 40 MG PO TBEC: 40.0000 mg | DELAYED_RELEASE_TABLET | Freq: Two times a day (BID) | ORAL | 3 refills | Status: AC

## 2016-09-13 IMAGING — CT CT CHEST W/O CM
2 of 4 series · 15 of 36 positions shown, 18 images · non-contrast
Comparison: Chest CT 06/16/2014 and 09/12/2013

CLINICAL DATA: Worsening shortness of breath and cough [REDACTED].
History of lung cancer in 7882 with recurrences in 2119, 5282 and
4888.

EXAM:
CT CHEST WITHOUT CONTRAST
TECHNIQUE: Multidetector CT imaging of the chest was performed following the
standard protocol without IV contrast.

[Series 2: chest routine with · axial · 0.62mm/px · z∈[-245,-25]mm · 12 of 52 slices shown, 15 images]
[im 4/52  mediastinal]
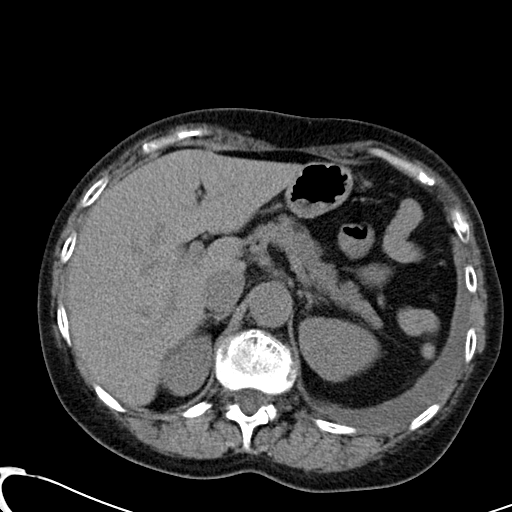
[im 4/52  lung]
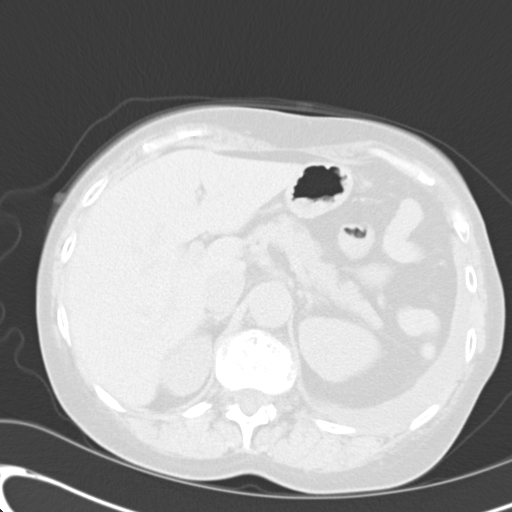
[im 8/52  lung]
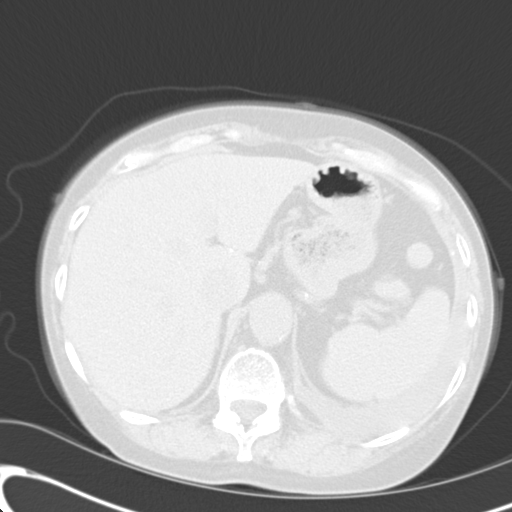
[im 12/52  lung]
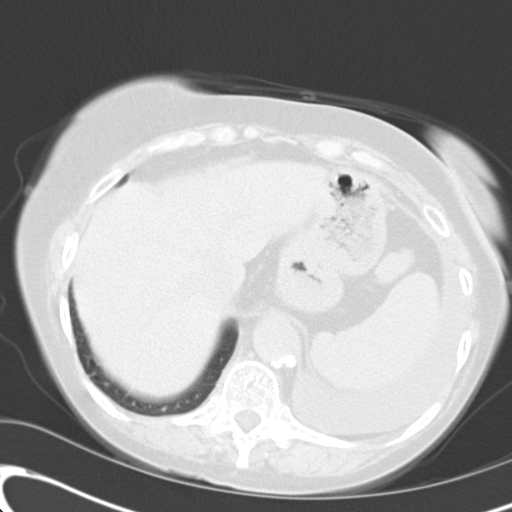
[im 16/52  lung]
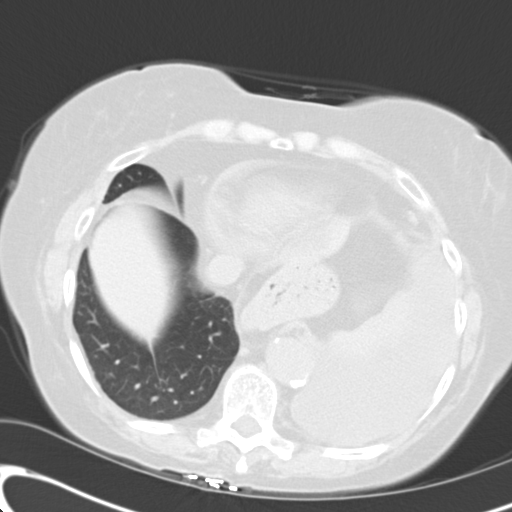
[im 20/52  mediastinal]
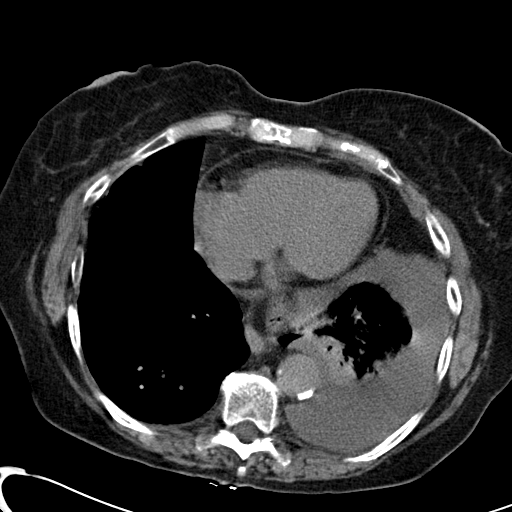
[im 20/52  lung]
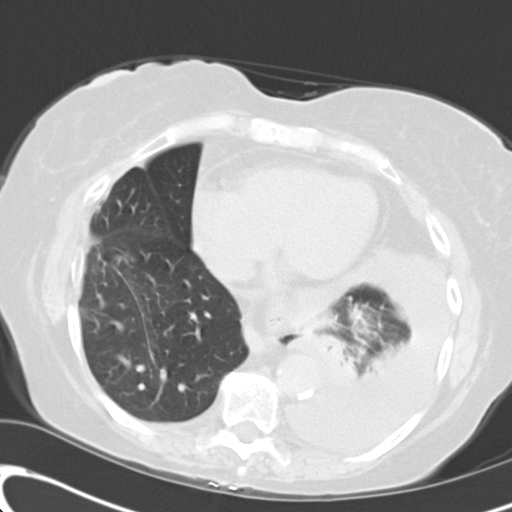
[im 24/52  lung]
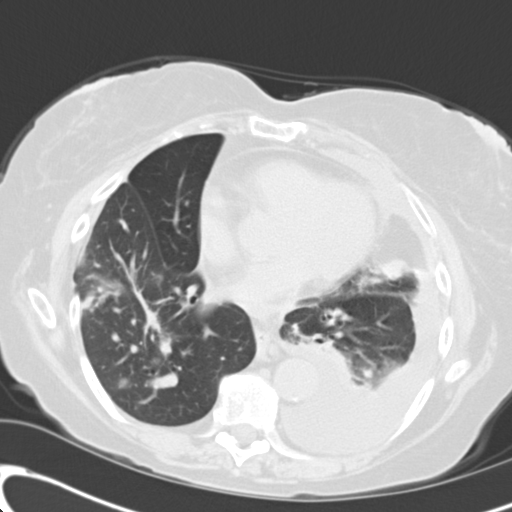
[im 28/52  lung]
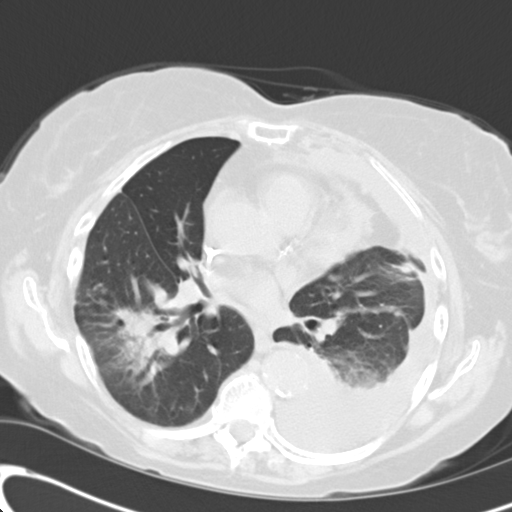
[im 32/52  lung]
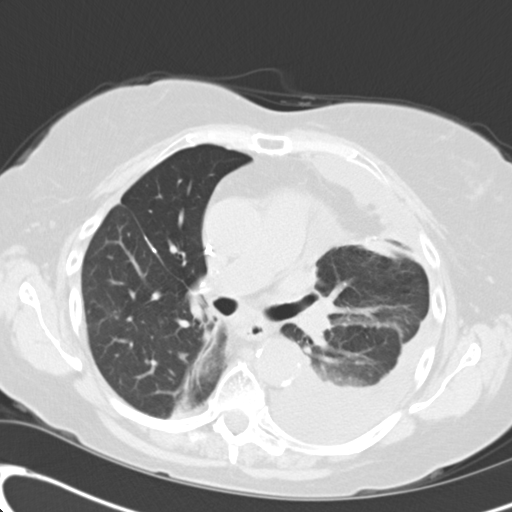
[im 36/52  mediastinal]
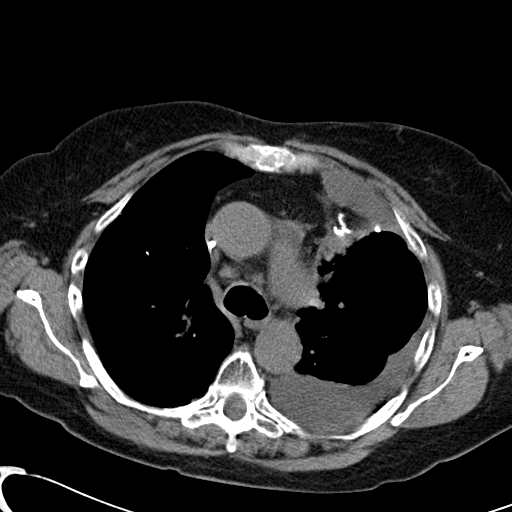
[im 36/52  lung]
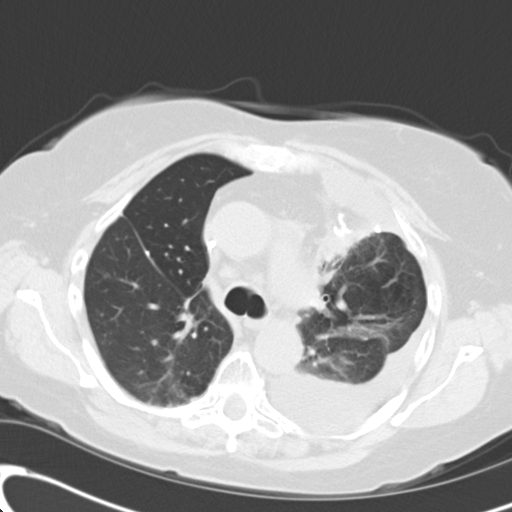
[im 40/52  lung]
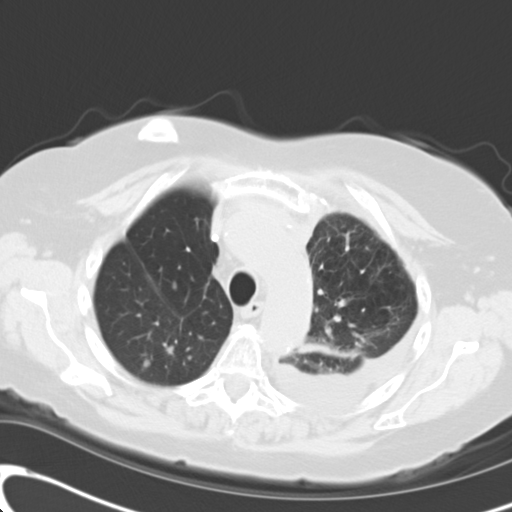
[im 44/52  lung]
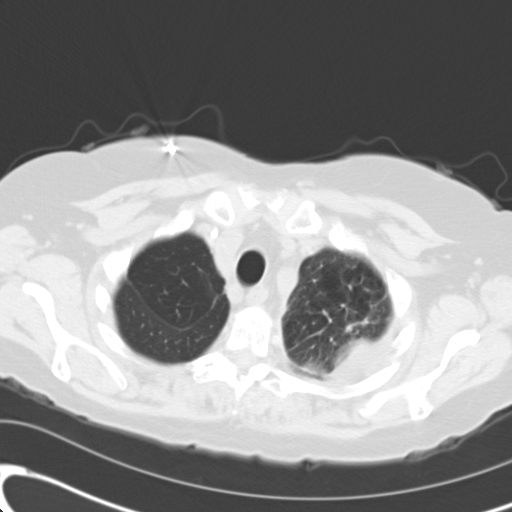
[im 48/52  lung]
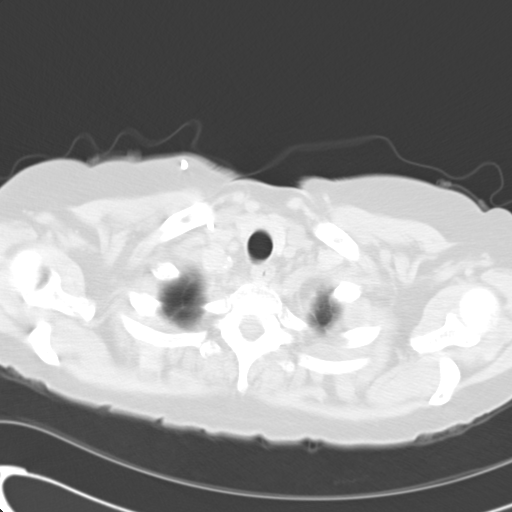

[Series 602: cor · coronal · 0.62mm/px · 3 of 118 slices shown]
[im 24/118  lung]
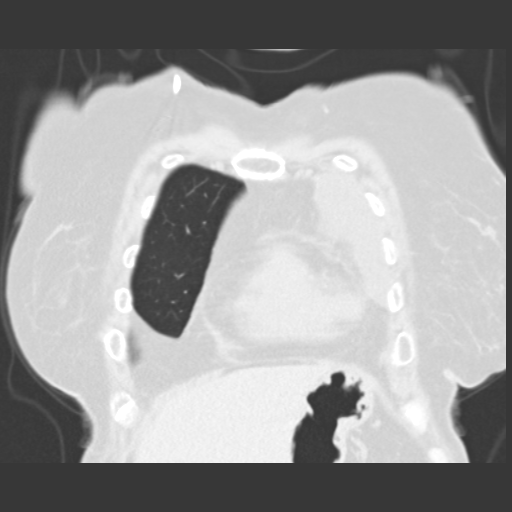
[im 47/118  lung]
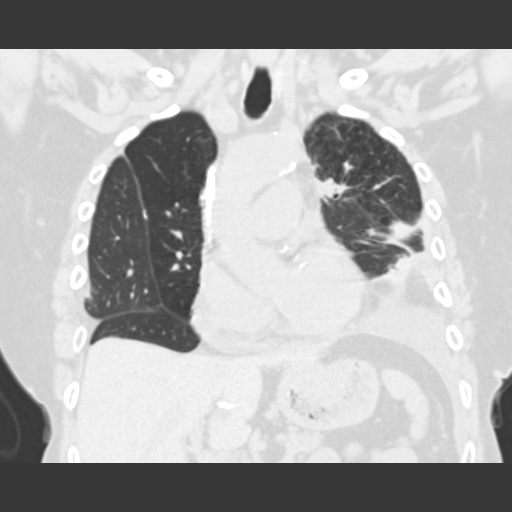
[im 71/118  lung]
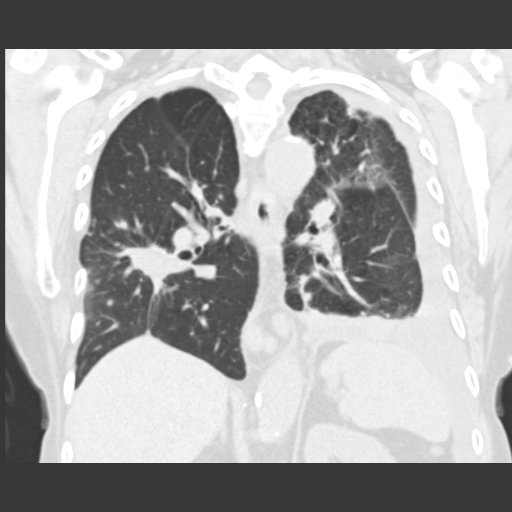

[15 of 36 positions shown; findings below may reference images not displayed]

FINDINGS: Mediastinum/Nodes: The right-sided Port-A-Cath is stable. No breast
masses, supraclavicular or axillary lymphadenopathy. Small scattered
lymph nodes are stable. The thyroid gland appears normal.

The heart is normal in size. No pericardial effusion. Stable aortic
and coronary artery calcifications.

Enlarging mediastinal lymph nodes.

5 mm right paratracheal nodule on image number 5. It previously
measured 4 mm

Right paratracheal lymph node on image number 8 measures 9.5 mm. It
previously measured 5 mm.

Subcarinal lymph node on image 22 measures 13 mm and previously
measured 8 mm.

Suspect enlarging right hilar lymph node but difficult to measure
without IV contrast.

Increasing soft tissue density adjacent to the aortic arch on image
number 14 measuring 14 mm. The esophagus is grossly normal.

Lungs/Pleura: Enlarging loculated left pleural fluid collection. No
obvious pleural nodules but limited examination without contrast.
Certainly suspicious for a malignant effusion and thoracentesis may
be helpful for diagnostic purposes. There is also compressive
atelectasis involving the left long with suspected small subpleural
nodules in the left lower lobe the largest measures 6 mm on image
number 29.

The right lower lobe density has increased since the prior CT scan.
It previously measured approximately 3.7 x 1.8 cm and now measures
4.0 x 3.5 cm. There are also enlarging adjacent right lower lobe
lymph nodes. A 7 x 5 mm node on image number 28 previously measured
4 x 4 mm.

A 4.5 mm right lower lobe pulmonary nodule on image 24 previously
measured 3 mm. There are also several enlarging right upper lobe
nodules. The largest measures 4.5 mm on image 13. There are least 4
other adjacent nodules. Stable area of ground-glass opacity more
posteriorly.

Upper abdomen: No significant upper abdominal findings. No findings
for adrenal or hepatic metastatic disease.

Musculoskeletal: There is a sclerotic lesion in the right aspect of
the L1 vertebral body. This is a new finding. Other smaller
sclerotic lesions are noted and suspicious for metastasis.
IMPRESSION: 1. Enlarging mediastinal lymph nodes, enlarging right lower lobe
lung mass, several enlarging right-sided pulmonary nodules,
moderate-size loculated left pleural effusion and several small
subpleural pulmonary nodules all suggesting progressive disease in
the chest.
2. New sclerotic bone lesions worrisome for metastasis.

## 2016-09-20 IMAGING — PT NM PET TUM IMG INITIAL (PI) SKULL BASE T - THIGH
8 of 9 series · 19 of 25 positions shown · non-contrast
Comparison: Chest CT on 10/30/2014 and PET-CT on 03/17/2010

CLINICAL DATA: Subsequent treatment strategy for lung carcinoma.
New right lung mass and mediastinal lymphadenopathy.

EXAM:
NUCLEAR MEDICINE PET SKULL BASE TO THIGH
TECHNIQUE: 8.1 mCi F-18 FDG was injected intravenously. Full-ring PET imaging
was performed from the skull base to thigh after the radiotracer. CT
data was obtained and used for attenuation correction and anatomic
localization.
FASTING BLOOD GLUCOSE:  Value: 80 mg/dl

[Series 3: pet sk_thigh ac · axial · 5.0mm · 4.07mm/px · z∈[-830,-18]mm · 3 of 204 slices shown]
[im 1/204]
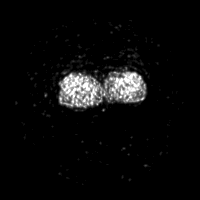
[im 68/204]
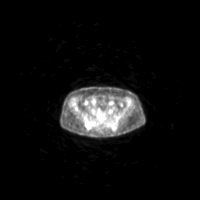
[im 204/204]
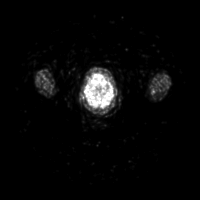

[Series 4: ct sk_thigh 5.0 b31f · axial · 5.0mm · 0.98mm/px · z∈[-830,-18]mm · 4 of 201 slices shown]
[im 1/201]
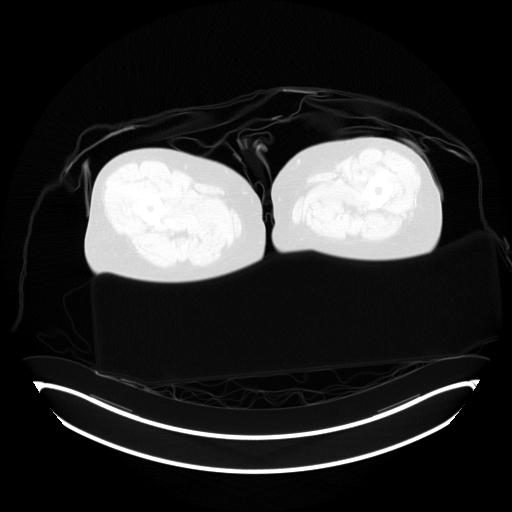
[im 51/201]
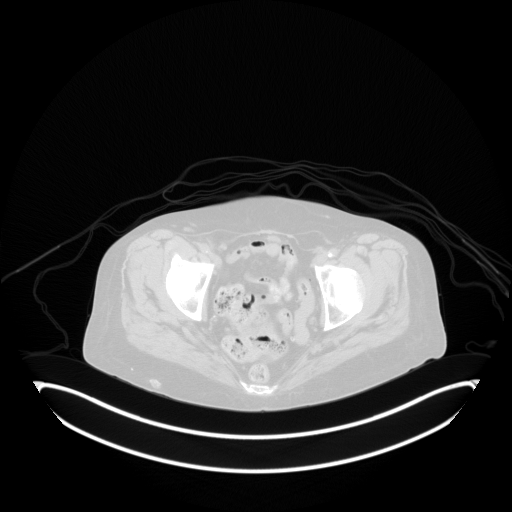
[im 151/201]
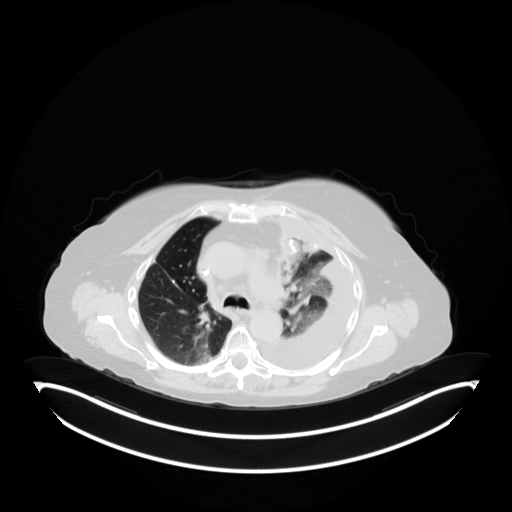
[im 201/201  brain]
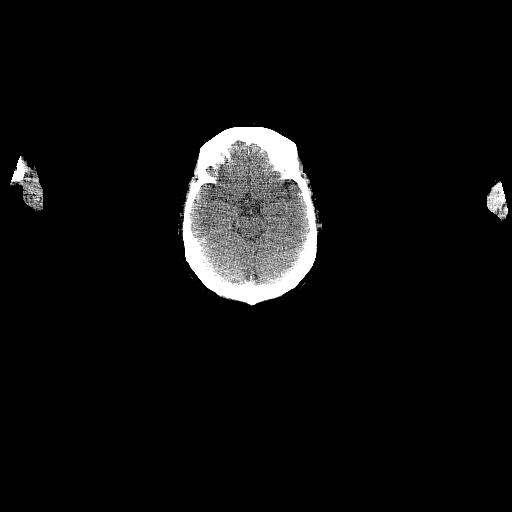

[Series 7: pet sk_thigh nac · axial · 5.0mm · 4.07mm/px · z∈[-830,-18]mm · 4 of 204 slices shown]
[im 1/204]
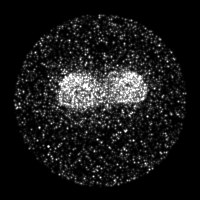
[im 102/204]
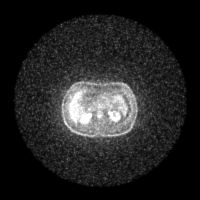
[im 153/204]
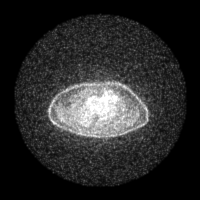
[im 204/204]
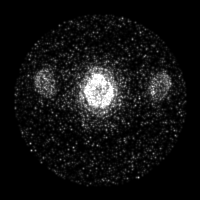

[Series 603: mip collection<mip range> · coronal · 1.69mm/px · 1 of 32 slices shown]
[im 1/32]
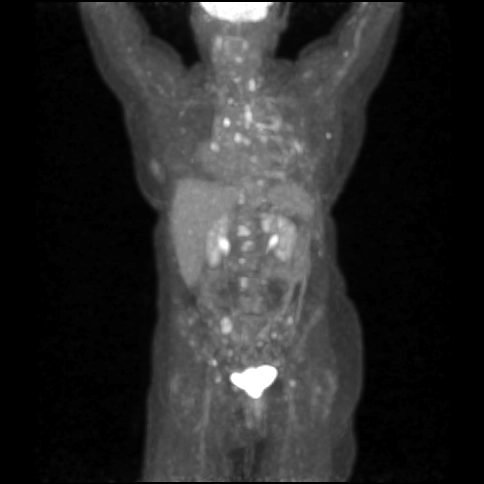

[Series 604: range-ct sk_thigh 5.0 (id)<alpha range> · 2 of 73 slices shown (1 of 2)]
[im 1/73]
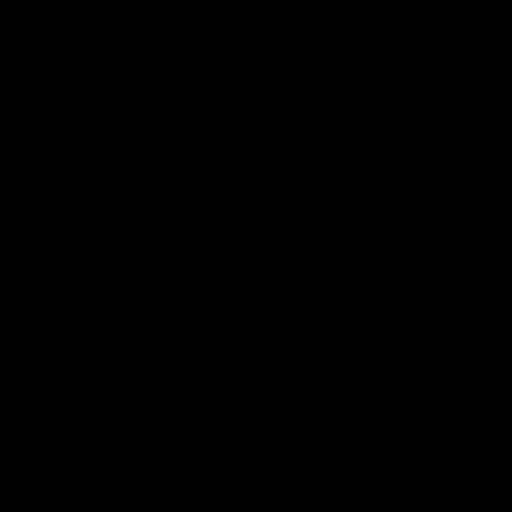
[im 73/73]
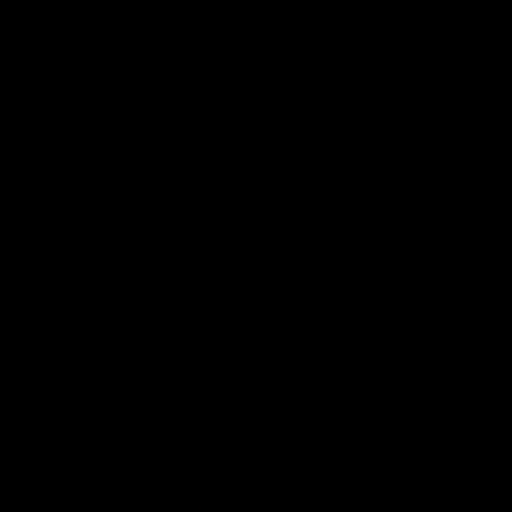

[Series 605: range-ct sk_thigh 5.0 (id)<alpha range> · 3 of 191 slices shown (2 of 2)]
[im 48/191]
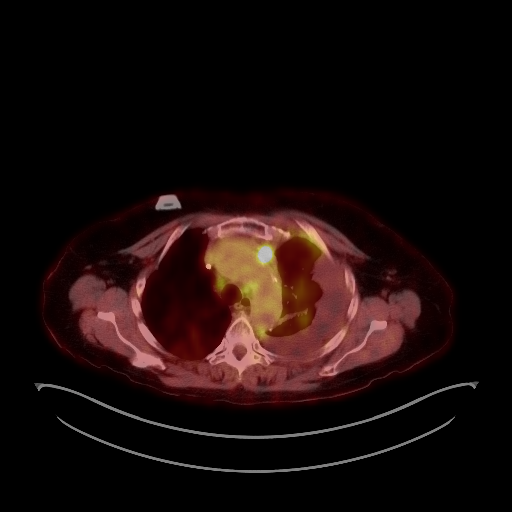
[im 96/191]
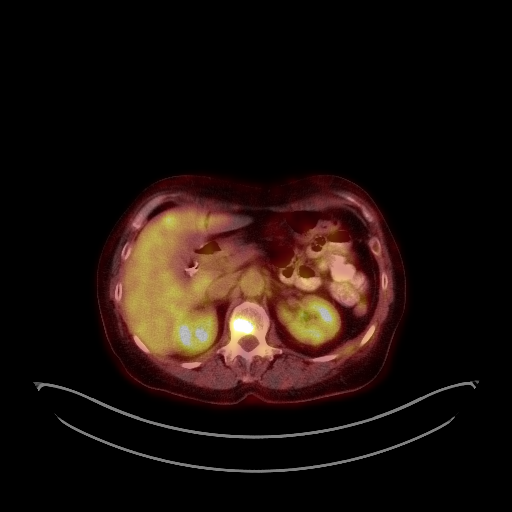
[im 143/191]
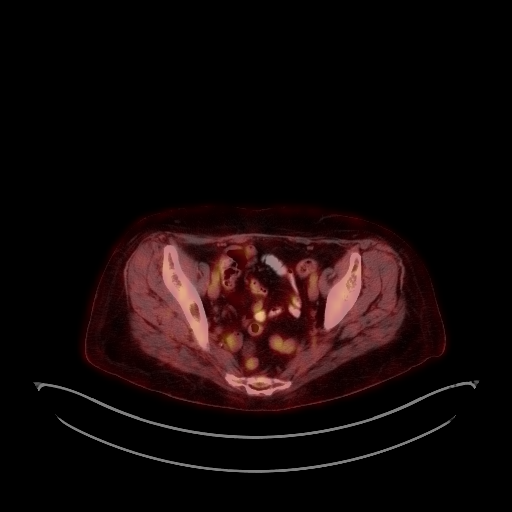

[Series 1175: results mm oncology reading · 0.92mm/px · 1 of 5 slices shown (1 of 2)]
[im 1/5]
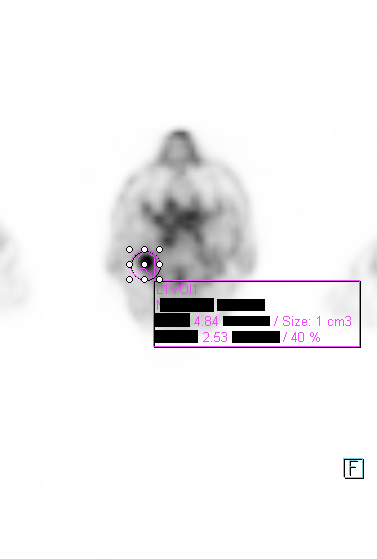

[Series 1244: results mm oncology reading · 0.37mm/px · 1 of 8 slices shown (2 of 2)]
[im 1/8]
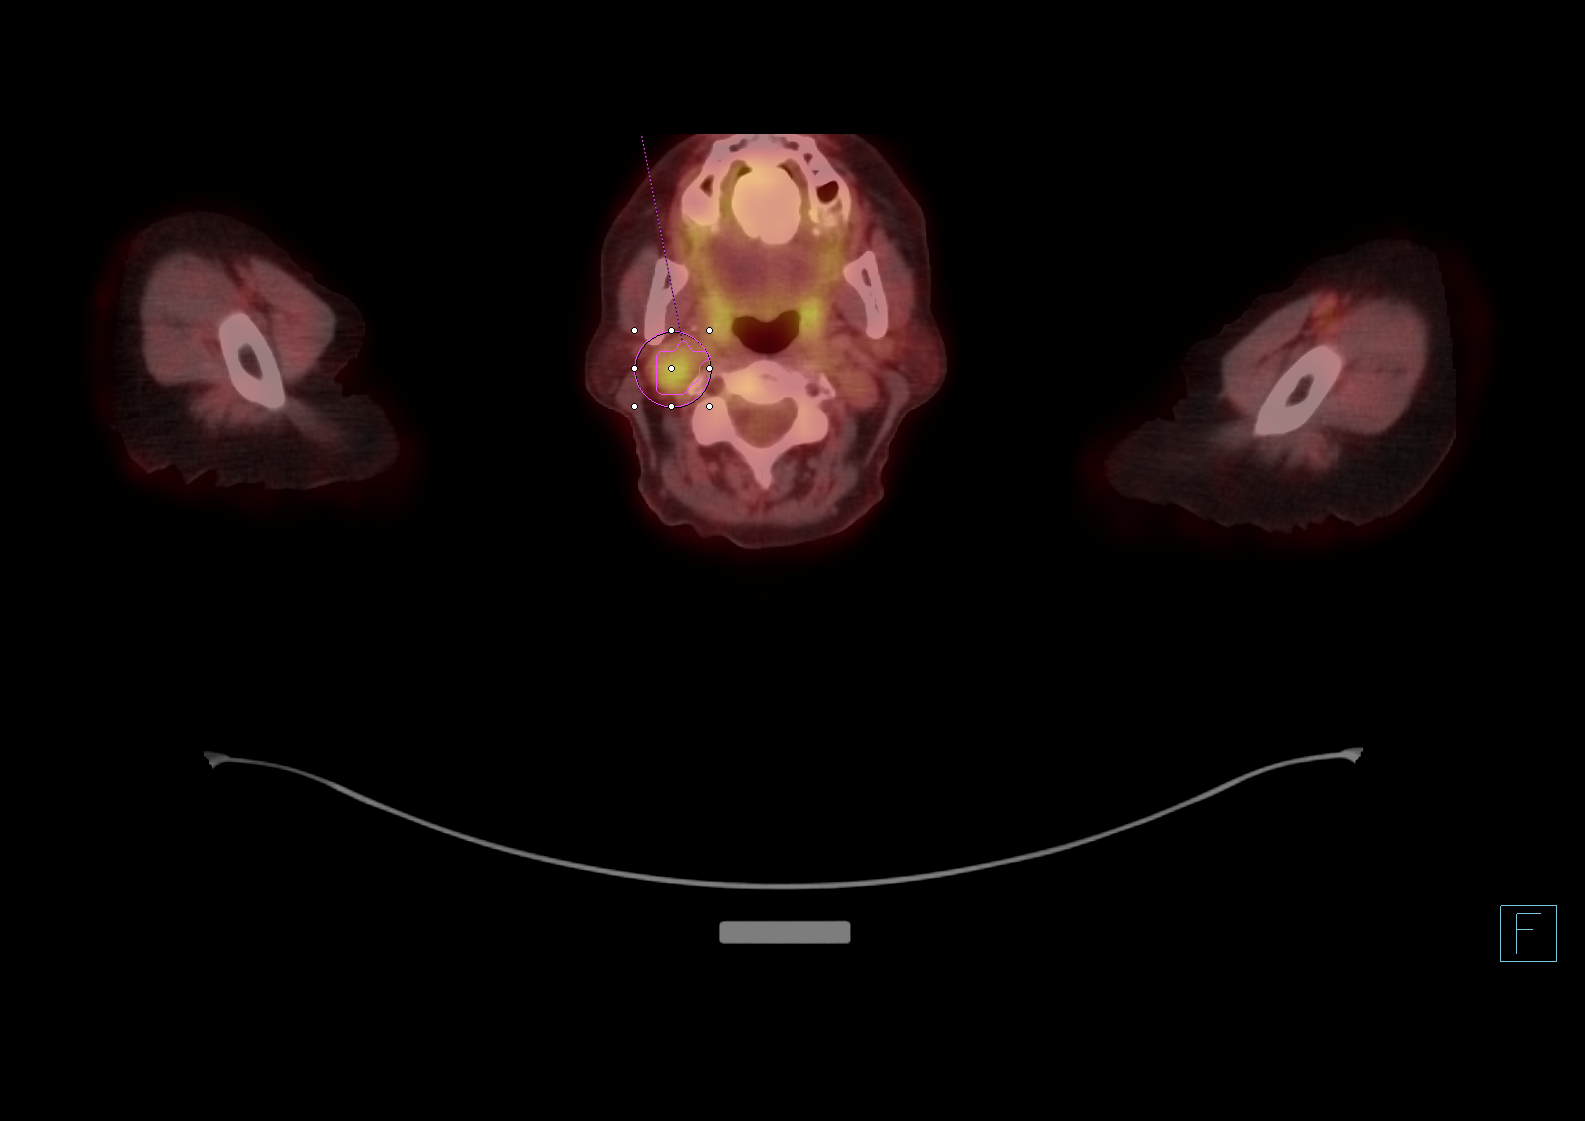

[19 of 25 positions shown; findings below may reference images not displayed]

FINDINGS: NECK

1.5 cm right level 2 jugular lymph node is hypermetabolic, with SUV
max of 6.3 on image 18 of series 3 and 4. Sub-cm left
supraclavicular lymph node on image 37 of series 3 and 4 is also
hypermetabolic, with SUV max of 8.1.

CHEST

New hypermetabolic lymphadenopathy is seen throughout the
mediastinum and involving both hilar regions. Index subcarinal lymph
node measures 2.0 cm and has SUV max of 9.5 on image 60/series 3 and
4. A sub-cm hypermetabolic left axillary lymph node is also seen on
image 58/series 3 and 4 with SUV max of 4.2.

Moderate left pleural effusion shows some focal areas of
hypermetabolic activity, highly suspicious for malignant pleural
effusion. Postsurgical changes and associated hypermetabolic
activity again seen in the anterior left upper lung field.

Focal ill-defined area of in the central right lower lobe shows
central air bronchograms, and has low-grade metabolic activity with
SUV max of 3.2. This could represent infectious or inflammatory
etiology or low-grade carcinoma.

ABDOMEN/PELVIS

No abnormal hypermetabolic activity within the liver, pancreas,
adrenal glands, or spleen. No hypermetabolic lymph nodes in the
abdomen or pelvis.

SKELETON

New diffuse hypermetabolic bone metastases are seen throughout the
spine, pelvis, and bilateral ribs.
IMPRESSION: New metabolic lymphadenopathy in the neck, mediastinum,, bilateral
hilar regions, and left axilla, consistent with metastatic disease.

Moderate left pleural effusion with areas of hypermetabolic
activity, highly suspicious for malignant pleural effusion.

Ill-defined central right lower lobe airspace disease with low-grade
metabolic activity. This is nonspecific and could represent
infectious or inflammatory etiology or low-grade carcinoma.

New diffuse hypermetabolic bone metastases.

## 2016-09-21 IMAGING — CR DG CHEST 1V PORT
1 series · 1 of 1 positions shown · non-contrast
Comparison: Chest radiograph October 02, 2014; chest CT October 30, 2014

CLINICAL DATA: Status post left-sided thoracentesis

EXAM:
PORTABLE CHEST 1 VIEW

[portable]
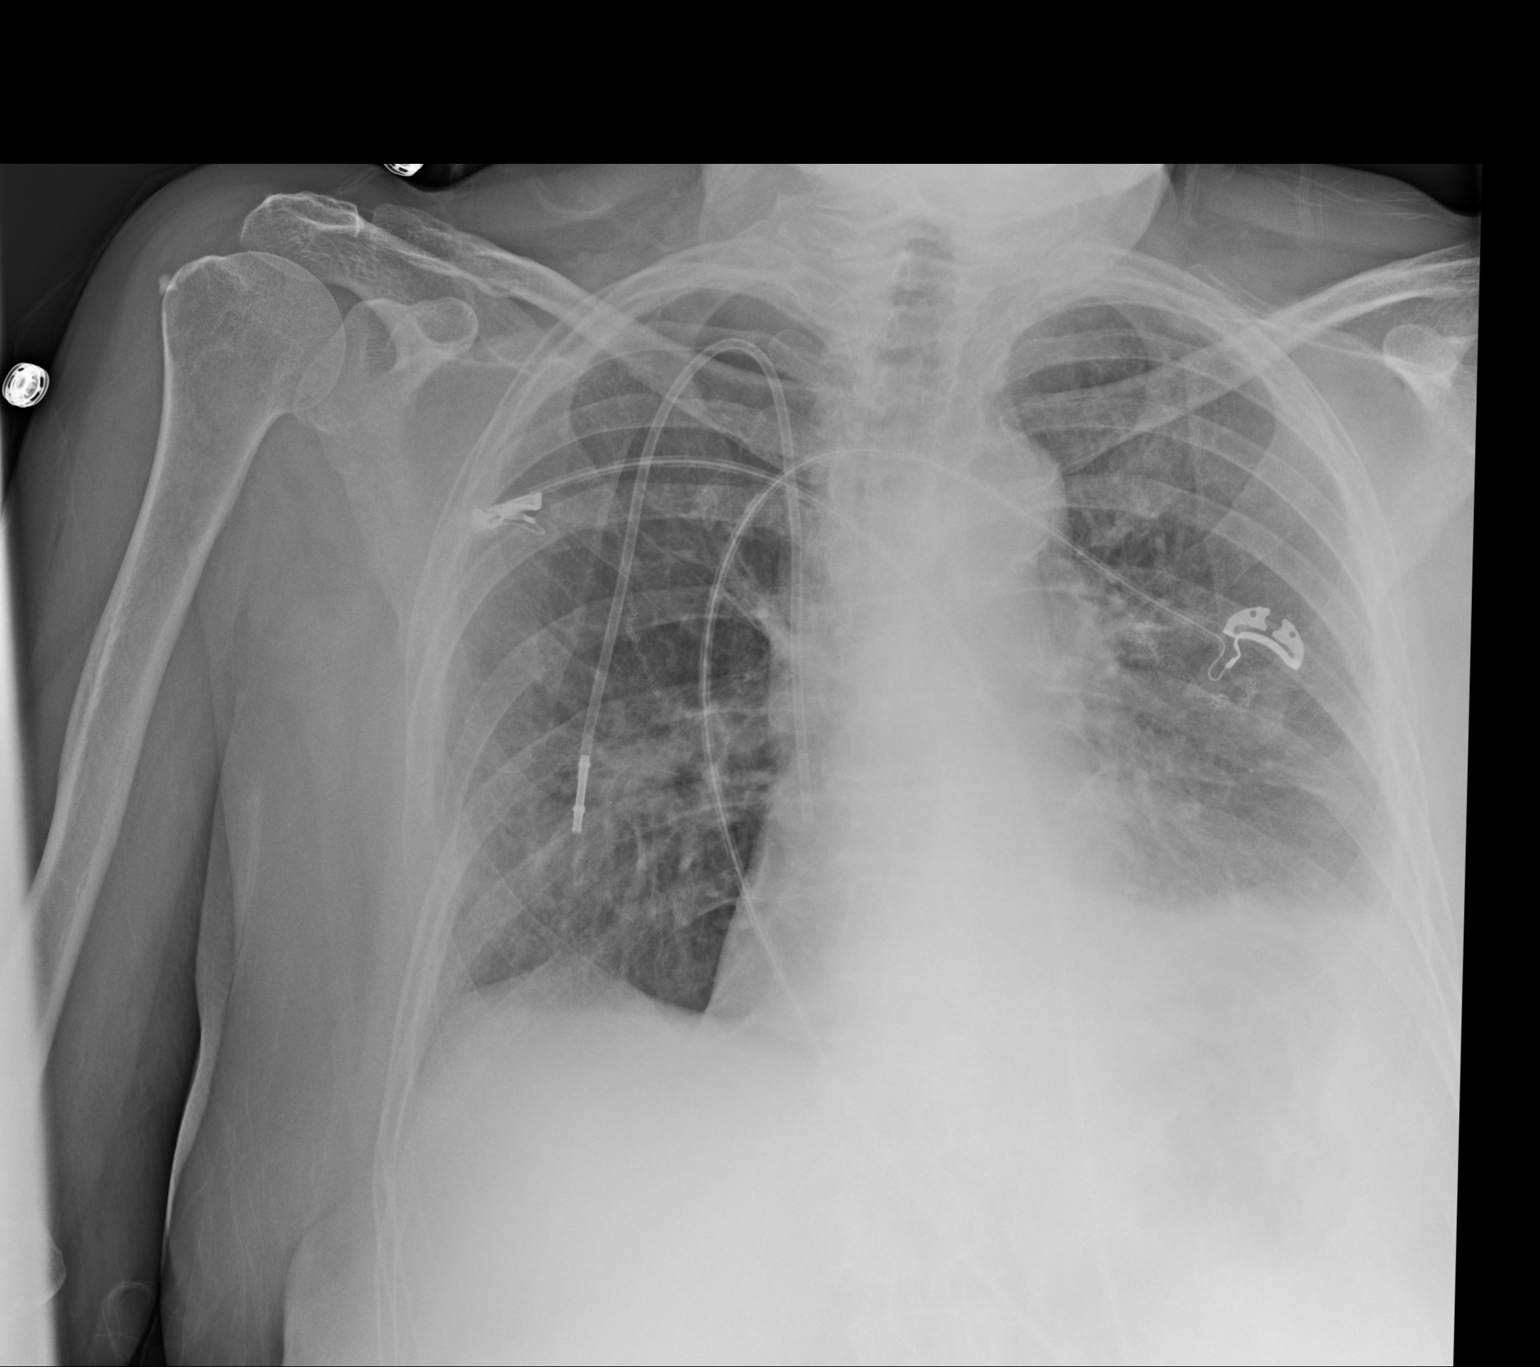

[1 of 1 positions shown; findings below may reference images not displayed]

FINDINGS: There is no pneumothorax. There is a small residual left pleural
effusion with left base atelectasis. There is a mass in the right
lower lobe region measuring 2.4 x 1.7 cm. Heart size and pulmonary
vascular normal. There is postoperative change on the left.
Port-A-Cath tip is at the cavoatrial junction. There is calcific
tendinosis in the right shoulder region.
IMPRESSION: No pneumothorax. Small left effusion with left base atelectasis.
Right lower lobe mass present. No change in cardiac silhouette.

## 2016-09-27 ENCOUNTER — Telehealth: Payer: Self-pay | Admitting: *Deleted

## 2016-09-27 NOTE — Telephone Encounter (Signed)
Received fax notification from [facility]  That Patient has passed away on 2016-10-16. No time of death given. Dr. Marin Olp notified.  Flowers ordered per Dr. Marin Olp request

## 2016-09-28 ENCOUNTER — Ambulatory Visit: Payer: PPO | Admitting: Hematology & Oncology

## 2016-09-28 ENCOUNTER — Other Ambulatory Visit: Payer: PPO

## 2016-09-28 ENCOUNTER — Ambulatory Visit: Payer: PPO

## 2016-10-02 IMAGING — MR MR HEAD WO/W CM
9 of 13 series · 34 of 48 positions shown · IV contrast (multihance)
Comparison: 10/23/2012

CLINICAL DATA: Headache. Left lower lobe lung cancer. Staging.
Persistent dry cough.

EXAM:
MRI HEAD WITHOUT AND WITH CONTRAST
TECHNIQUE: Multiplanar, multiecho pulse sequences of the brain and surrounding
structures were obtained without and with intravenous contrast.
CONTRAST:  12mL MULTIHANCE GADOBENATE DIMEGLUMINE 529 MG/ML IV SOLN

[Series 4: DWI · axial · 3.0mm · 1.09mm/px · z∈[-19,+125]mm · 9 of 100 slices shown (1 of 4)]
[im 1/100]
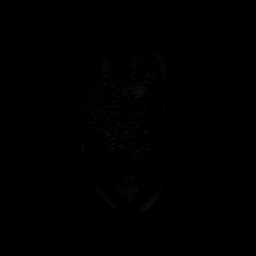
[im 13/100]
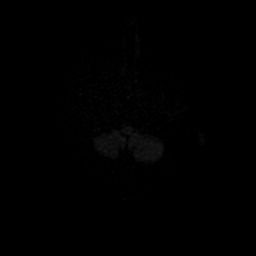
[im 25/100]
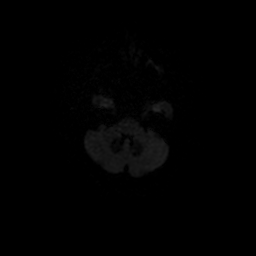
[im 38/100]
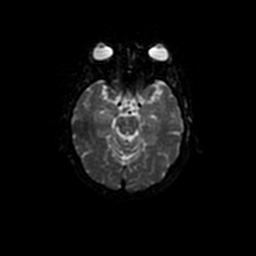
[im 50/100]
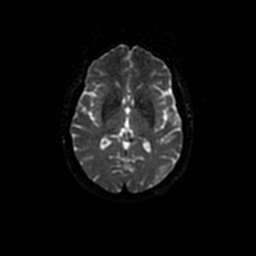
[im 62/100]
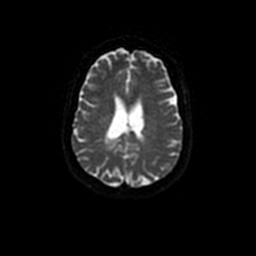
[im 75/100]
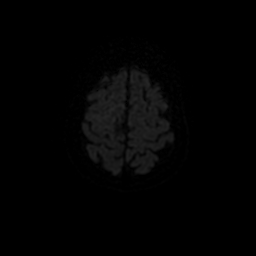
[im 87/100]
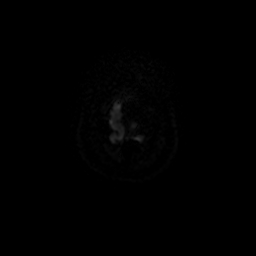
[im 100/100]
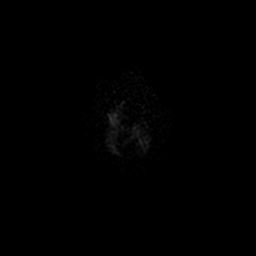

[Series 5: DWI · coronal · 5.0mm · 1.09mm/px · 6 of 72 slices shown (2 of 4)]
[im 1/72]
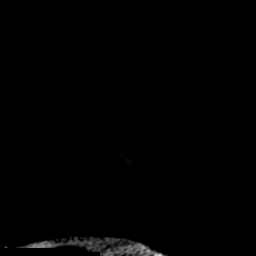
[im 15/72]
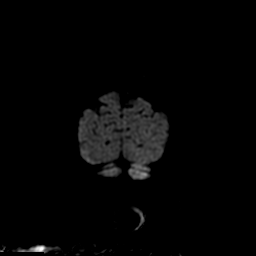
[im 29/72]
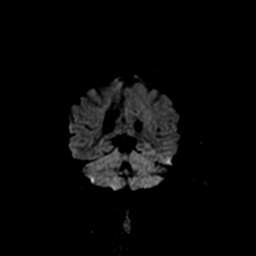
[im 43/72]
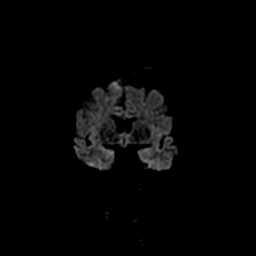
[im 57/72]
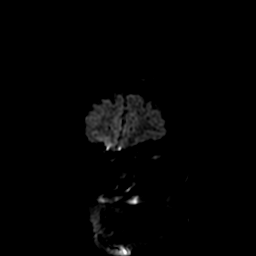
[im 72/72]
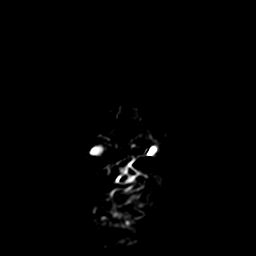

[Series 6: T2 · axial · 5.0mm · 0.43mm/px · z∈[-24,+123]mm · 2 of 24 slices shown]
[im 1/24]
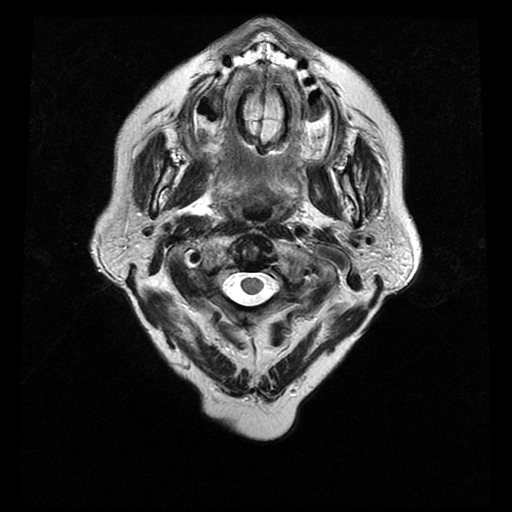
[im 24/24]
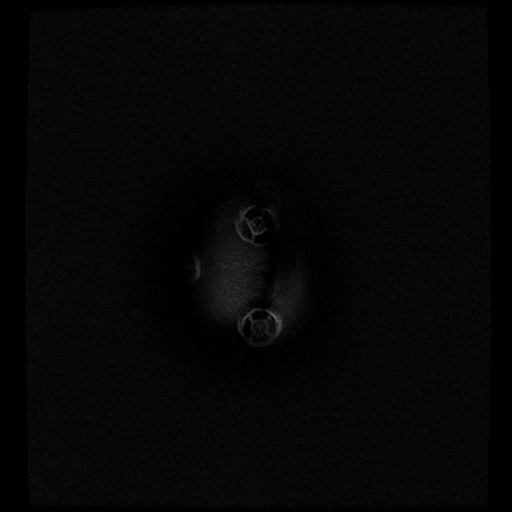

[Series 7: FLAIR · axial · 5.0mm · 0.43mm/px · z∈[-26,+125]mm · 2 of 23 slices shown]
[im 1/23]
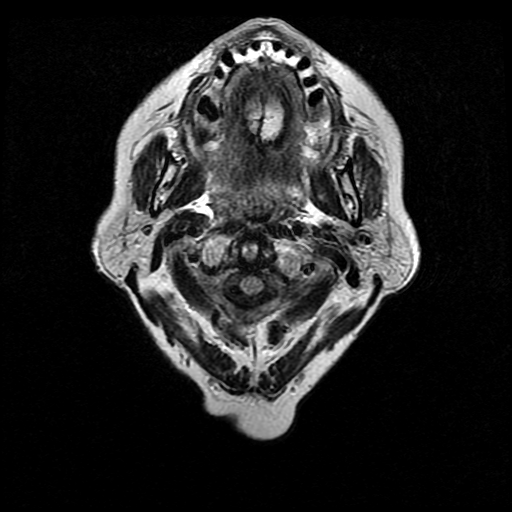
[im 23/23]
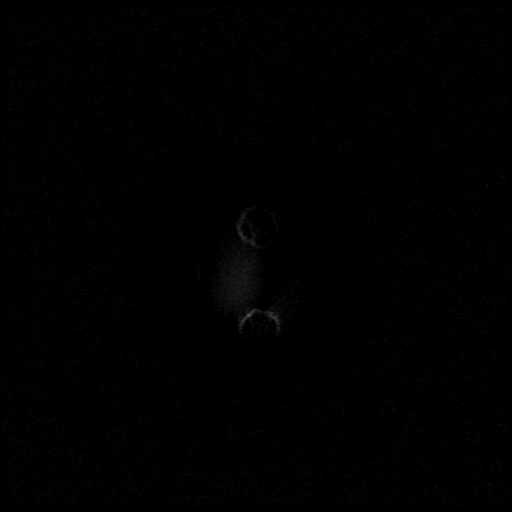

[Series 10: T2 post-contrast · coronal · 5.0mm · 0.45mm/px · 3 of 28 slices shown]
[im 1/28]
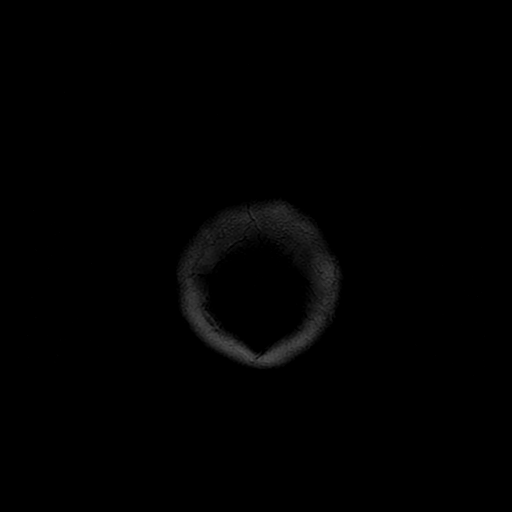
[im 14/28]
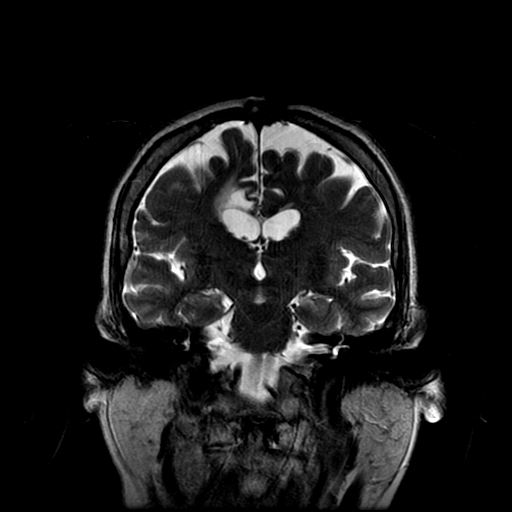
[im 28/28]
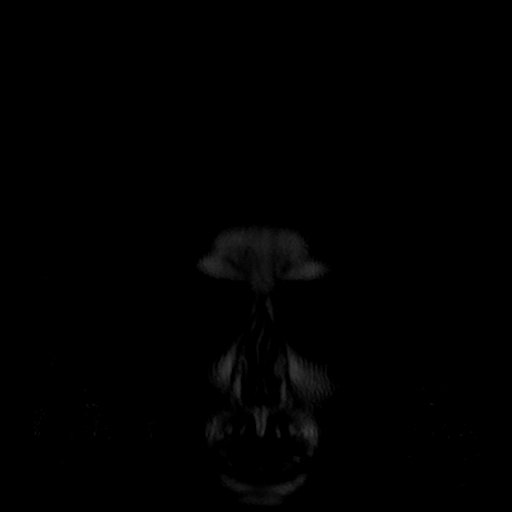

[Series 12: T1 post-contrast · coronal · 5.0mm · 0.45mm/px · 3 of 28 slices shown (1 of 2)]
[im 1/28]
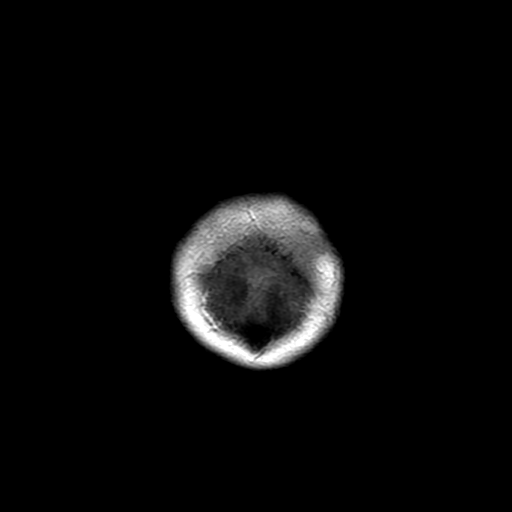
[im 14/28]
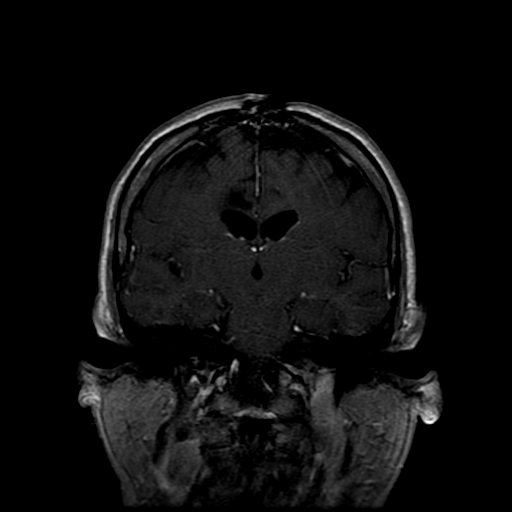
[im 28/28]
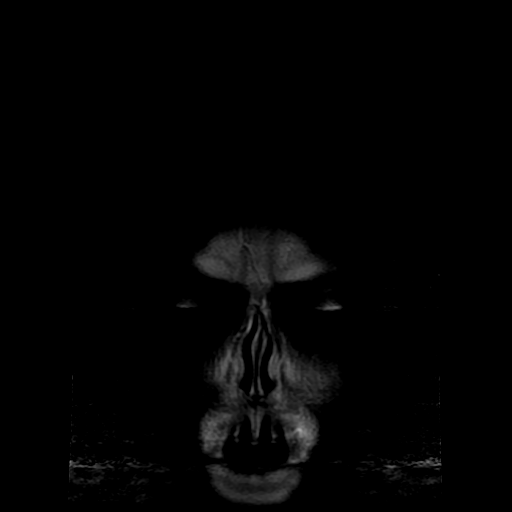

[Series 13: T1 post-contrast · sagittal · 5.0mm · 0.47mm/px · 2 of 23 slices shown (2 of 2)]
[im 1/23]
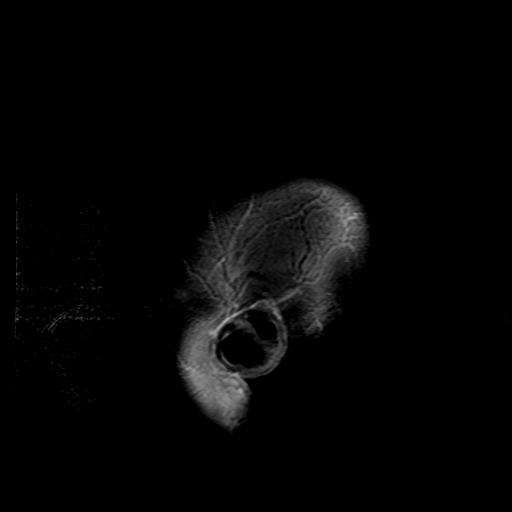
[im 23/23]
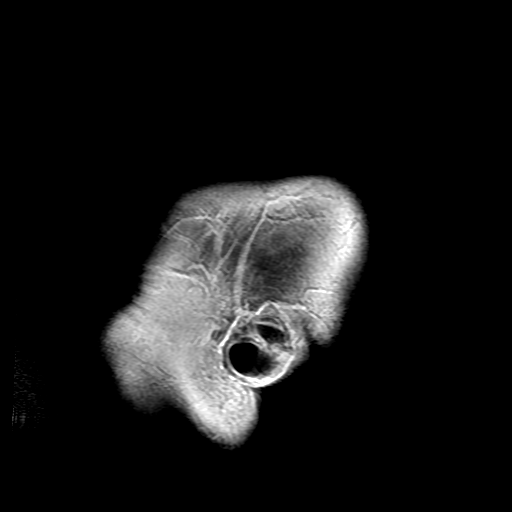

[Series 400: DWI · axial · 3.0mm · 1.09mm/px · z∈[-19,+125]mm · 4 of 50 slices shown (3 of 4)]
[im 1/50]
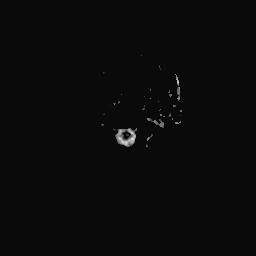
[im 17/50]
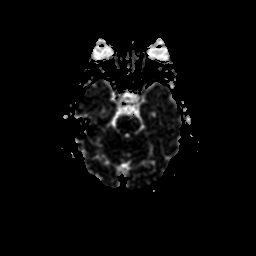
[im 33/50]
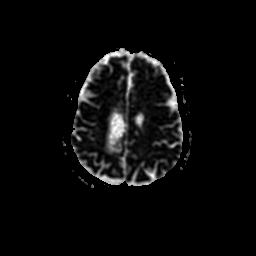
[im 50/50]
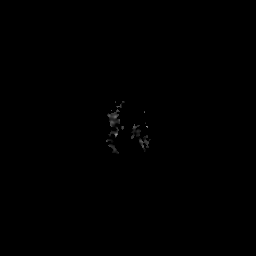

[Series 500: DWI · coronal · 5.0mm · 1.09mm/px · 3 of 36 slices shown (4 of 4)]
[im 1/36]
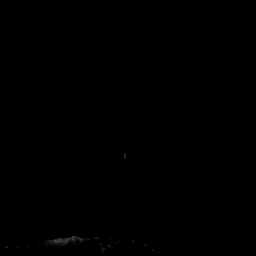
[im 18/36]
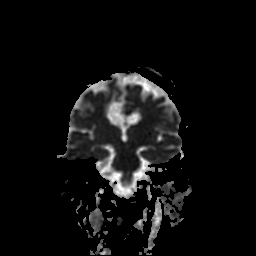
[im 36/36]
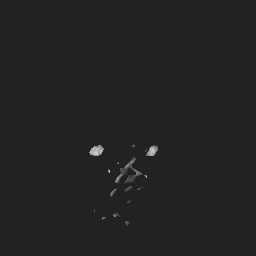

[34 of 48 positions shown; findings below may reference images not displayed]

FINDINGS: Partially empty sella is unchanged. Sequelae of prior parietal
vertex craniotomy are again identified with underlying postoperative
encephalomalacia in the posteromedial right frontoparietal region
and corpus callosum, unchanged. A small amount of chronic blood
products are noted in this region.

There is no evidence of acute infarct, mass, midline shift, or
extra-axial fluid collection. There is no hydrocephalus. There are a
few punctate foci of T2 hyperintensity elsewhere in the cerebral
white matter, nonspecific and not greater than expected for
patient's age. No abnormal enhancement is identified. There is mild
global cerebral atrophy.

Orbits are unremarkable. No significant inflammatory changes are
seen in the paranasal sinuses or mastoid air cells. Major
intracranial vascular flow voids are preserved.
IMPRESSION: 1. No evidence of intracranial metastases.
2. Unchanged postoperative encephalomalacia in the medial right
frontoparietal region.

## 2016-10-06 ENCOUNTER — Ambulatory Visit: Payer: PPO | Admitting: Radiation Oncology

## 2016-10-08 DEATH — deceased

## 2016-10-19 ENCOUNTER — Ambulatory Visit: Payer: PPO

## 2016-10-19 ENCOUNTER — Other Ambulatory Visit: Payer: PPO

## 2016-10-19 ENCOUNTER — Ambulatory Visit: Payer: PPO | Admitting: Hematology & Oncology

## 2016-10-24 IMAGING — DX DG CHEST 2V
2 series · 2 of 2 positions shown · non-contrast
Comparison: 12/02/2014

CLINICAL DATA: Metastatic right lung carcinoma. Shortness of
breath. Pleural effusion. Undergoing chemotherapy.

EXAM:
CHEST  2 VIEW

[chest pa]
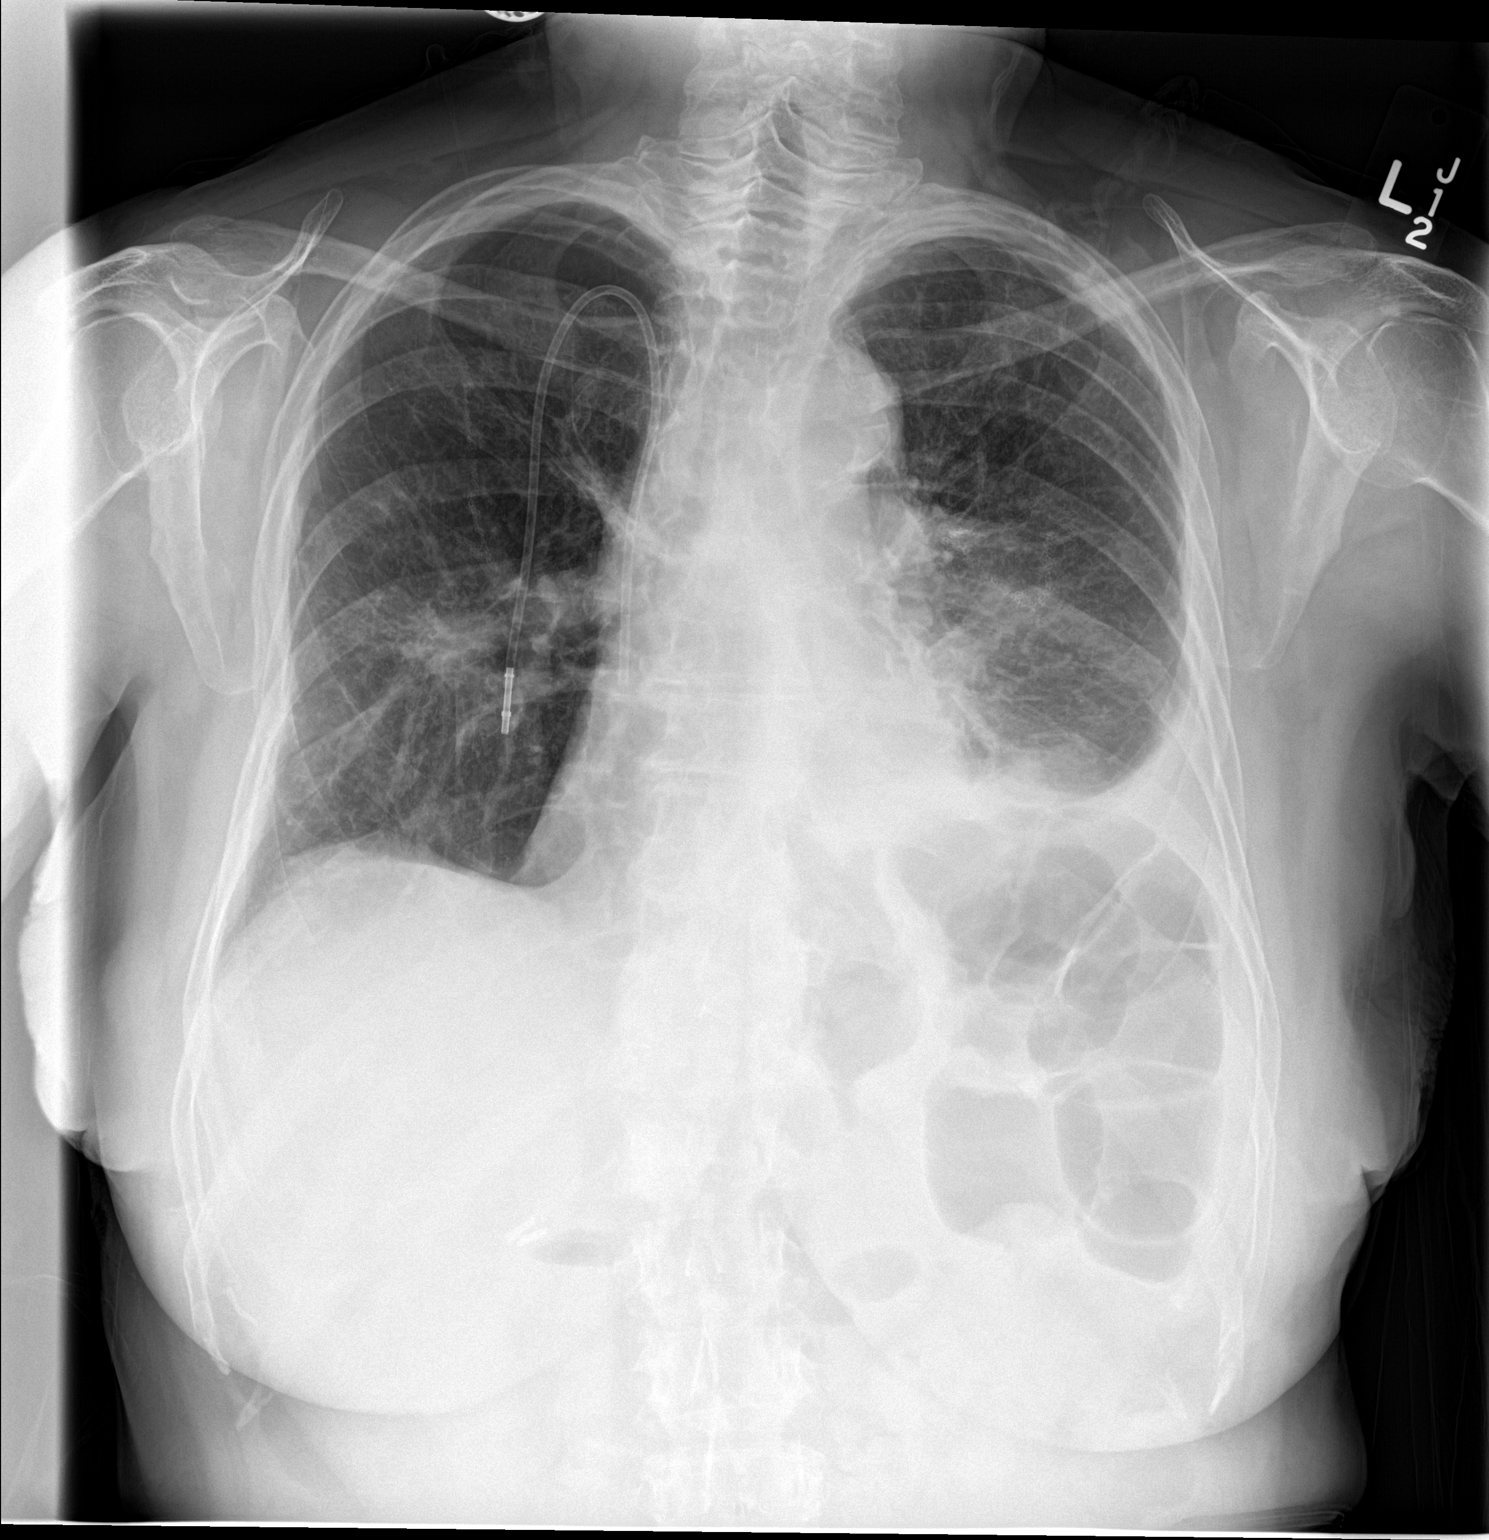

[chest lat]
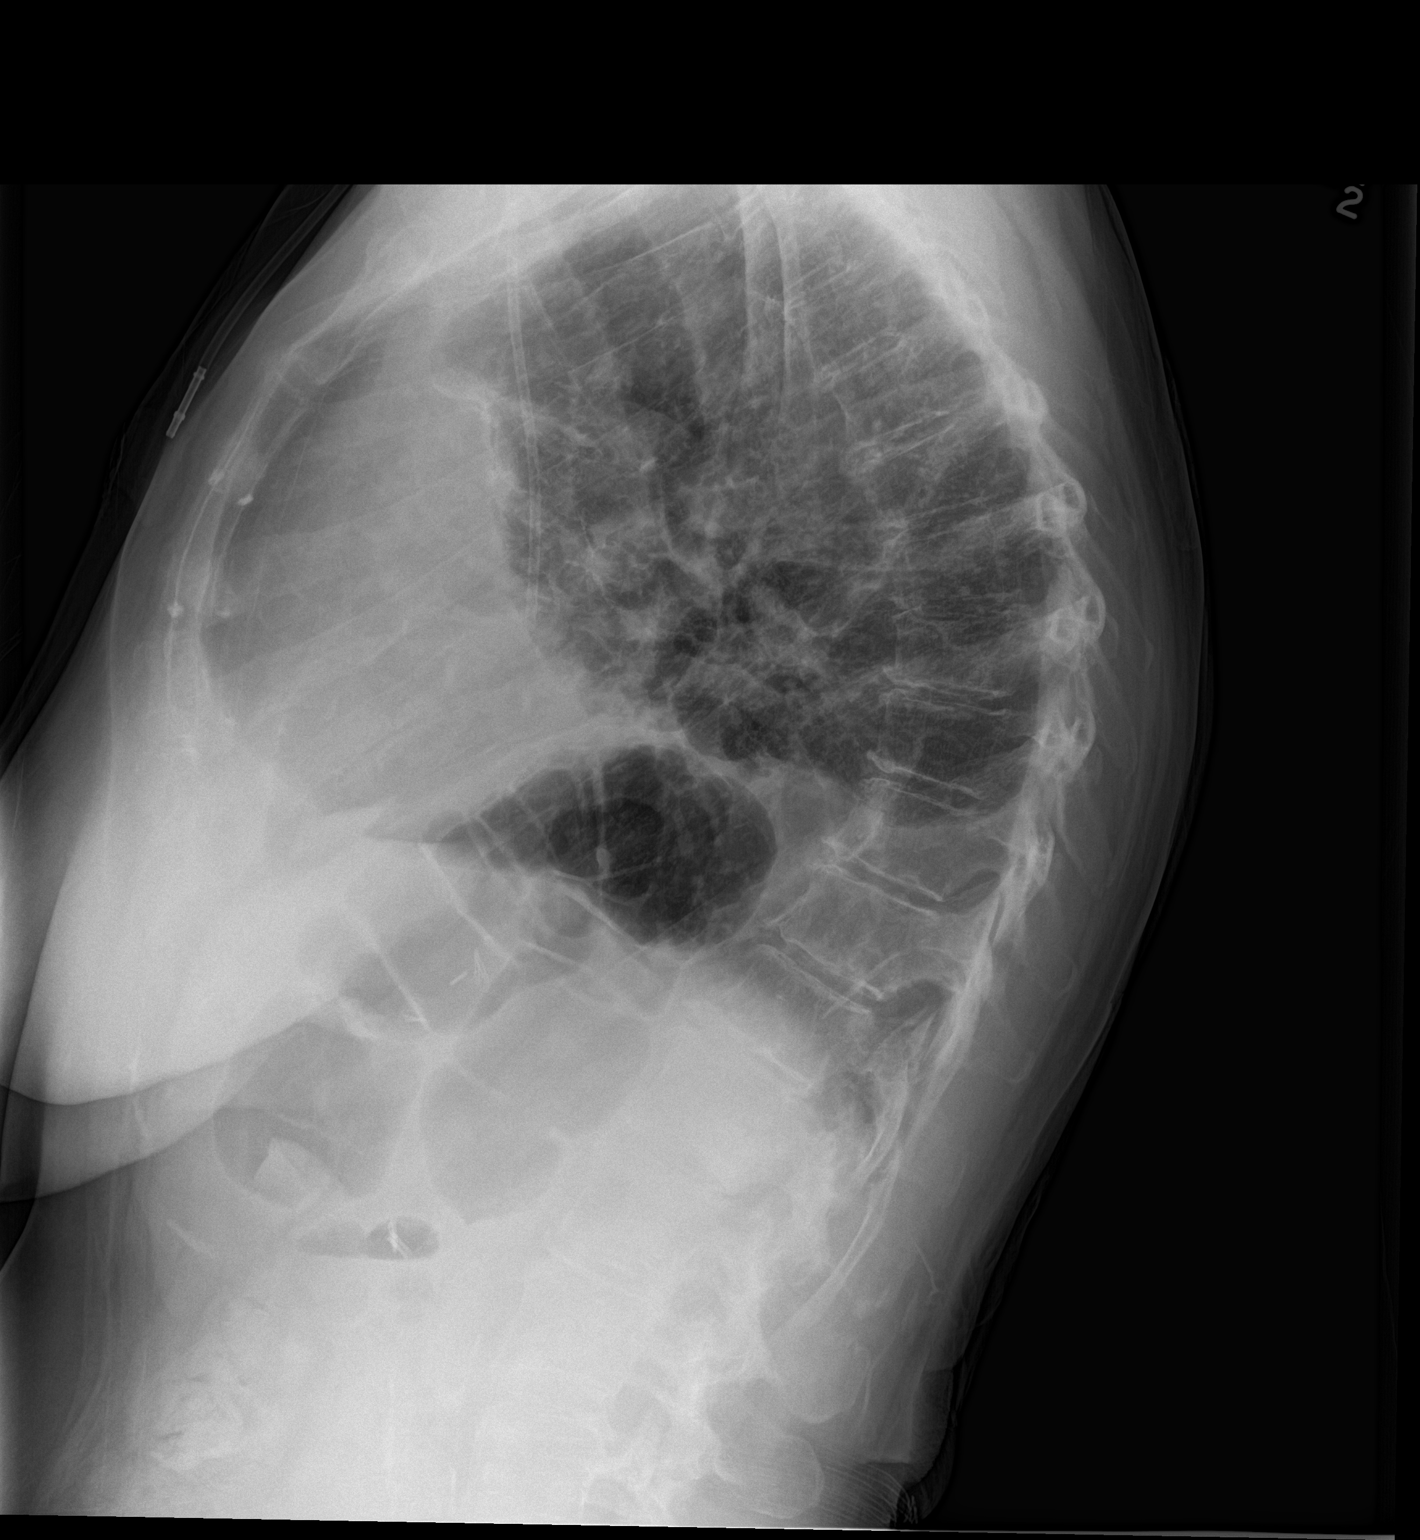

[2 of 2 positions shown; findings below may reference images not displayed]

FINDINGS: Ill-defined masslike opacity in right perihilar region appears
decreased since previous study. Right upper lobe scarring is stable.

Small left pleural effusion and left lower lung atelectasis remains
stable. No evidence of pneumothorax. Heart size remains stable.
IMPRESSION: No significant change in small left pleural effusion and left lower
lung atelectasis.

Decreased prominence of masslike opacity in right perihilar region.

## 2016-11-09 ENCOUNTER — Other Ambulatory Visit: Payer: PPO

## 2016-11-09 ENCOUNTER — Ambulatory Visit: Payer: PPO | Admitting: Hematology & Oncology

## 2016-11-09 ENCOUNTER — Ambulatory Visit: Payer: PPO

## 2016-11-13 IMAGING — CT NM PET TUM IMG RESTAG (PS) SKULL BASE T - THIGH
1 of 8 series · 1 of 25 positions shown · non-contrast
Comparison: None.

CLINICAL DATA: Subsequent treatment strategy for metastatic right
lung cancer status post chemotherapy. Right upper lobe lung cancer
originally diagnosed in 8228.

EXAM:
NUCLEAR MEDICINE PET SKULL BASE TO THIGH
TECHNIQUE: 6.8 mCi F-18 FDG was injected intravenously. Full-ring PET imaging
was performed from the skull base to thigh after the radiotracer. CT
data was obtained and used for attenuation correction and anatomic
localization.
FASTING BLOOD GLUCOSE:  Value: 87 mg/dl

[Series 4: ct sk_thigh 5.0 b31f · axial · 5.0mm · 0.94mm/px · 1 of 192 slices shown]
[im 192/192  brain]
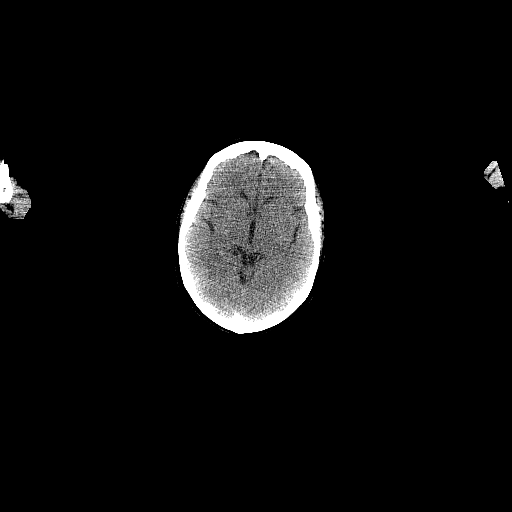

[1 of 25 positions shown; findings below may reference images not displayed]

FINDINGS: NECK

Mildly enlarged hypermetabolic 1.2 cm right level II cervical node
(series 4/ image 22) demonstrates max SUV 5.3, previously 1.2 cm
with max SUV 6.3. Mildly hypermetabolic left level IV 0.8 cm lymph
node (series 4/ image 41) demonstrates max SUV 3.4, previously
cm with max SUV 8.1. No new hypermetabolic neck nodes.

CHEST

No hypermetabolic axillary nodes. Decreased metabolism within
hypermetabolic mildly enlarged 1.1 cm right paratracheal node (4/56)
with max SUV 8.5, previously 1.1 cm with max SUV 11.6. Decreased
metabolism within hypermetabolic mildly enlarged 1.3 cm subcarinal
node (4/62) with max SUV 4.8, previously 1.4 cm with max SUV 9.5.
Decreased metabolism within mildly hypermetabolic bilateral hilar
lymph nodes, for example a right hilar node with max SUV 3.9,
previously 8.0. Decreased metabolism within the left pleural space,
for example a mildly hypermetabolic focus of pleural thickening in
the anterior superior left mediastinal pleural surface with max SUV
5.3 (series 4/image 53), previous max SUV 10.4. Small moderate left
pleural effusion is decreased. Stable coronary atherosclerosis and
right internal jugular MediPort position. Status post right upper
lobectomy and left upper lobe wedge resection. Slightly decreased
metabolism within the sharply marginated focus of consolidation in
the central right lower lobe, with max SUV 2.5, previously 3.2.
Several subcentimeter satellite pulmonary nodules in the right lower
lobe appears stable and below PET resolution. Stable patchy
consolidation and volume loss in the lingula and left lower lobe. No
acute consolidative airspace disease.

ABDOMEN/PELVIS

No abnormal hypermetabolic activity within the liver, pancreas,
adrenal glands, or spleen. No hypermetabolic lymph nodes in the
abdomen or pelvis. Status post cholecystectomy. Small to moderate
hiatal hernia.

SKELETON

There are innumerable sclerotic osseous metastases throughout the
axial skeleton involving the thoracolumbar spine, bilateral ribs and
bilateral pelvic girdle, which demonstrate increased sclerosis and
decreased metabolism, in keeping with treatment effect. For example
a right posterior medial iliac bone sclerotic osseous metastasis
demonstrates max SUV 5.1, previously 8.4.
IMPRESSION: 1. Partial interval treatment response by PET-CT, with decreased FDG
uptake in the bilateral neck, mediastinal and bilateral hilar nodal
metastases, decreased FDG uptake in the left pleural and right lower
lobe lung metastatic disease and decreased FDG uptake within the
widespread axial skeleton osseous metastases.
2. Small to moderate malignant left pleural effusion is decreased in
size.

## 2016-11-18 ENCOUNTER — Other Ambulatory Visit: Payer: Self-pay | Admitting: Nurse Practitioner

## 2016-11-25 IMAGING — DX DG CHEST 2V
2 series · 2 of 2 positions shown · non-contrast
Comparison: 12/10/2014 and chest x-ray at [REDACTED] on
12/02/2014 prior to left thoracentesis.

CLINICAL DATA: Chest pain and shortness of breath. History of
metastatic lung carcinoma.

EXAM:
CHEST - 2 VIEW

[chest lat]
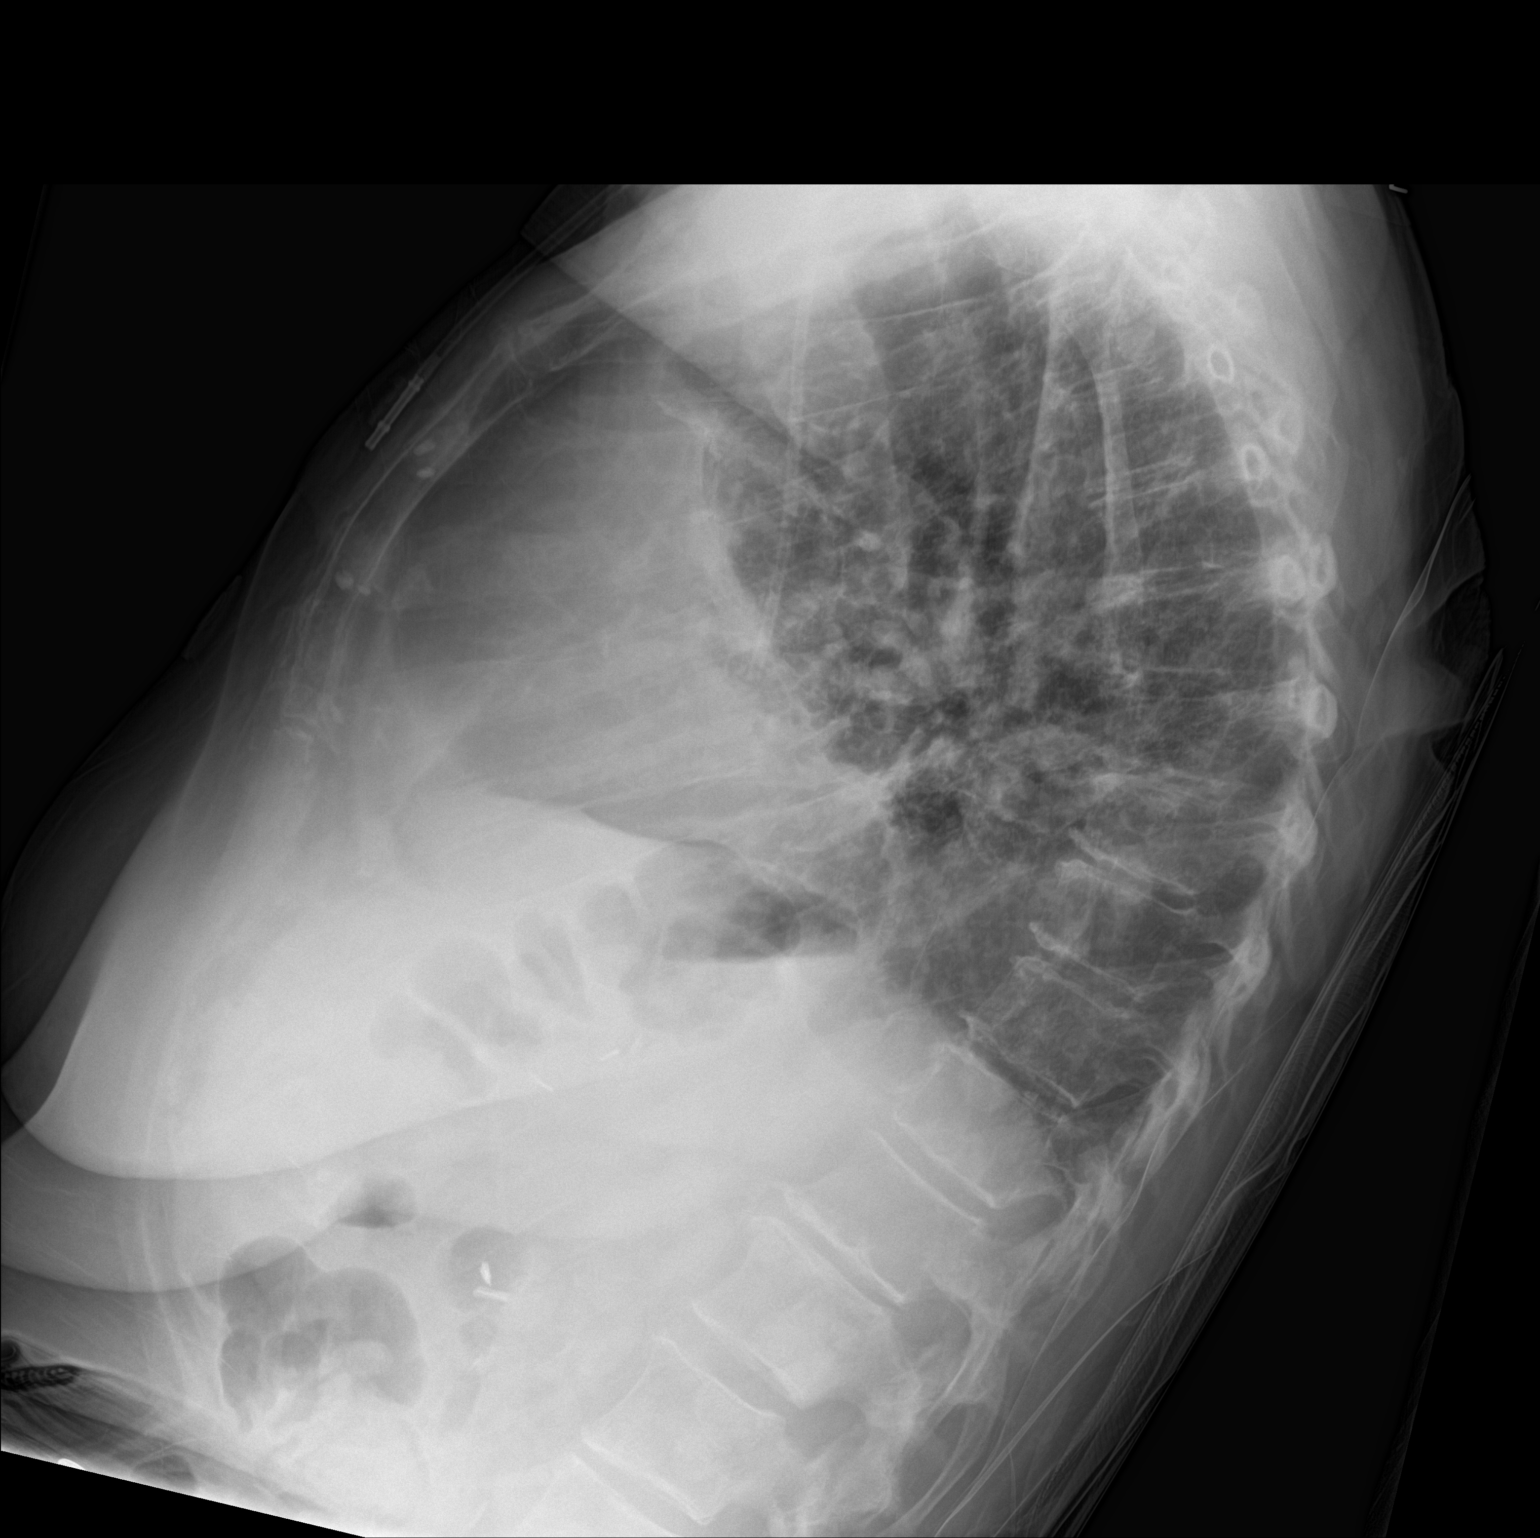

[chest ap]
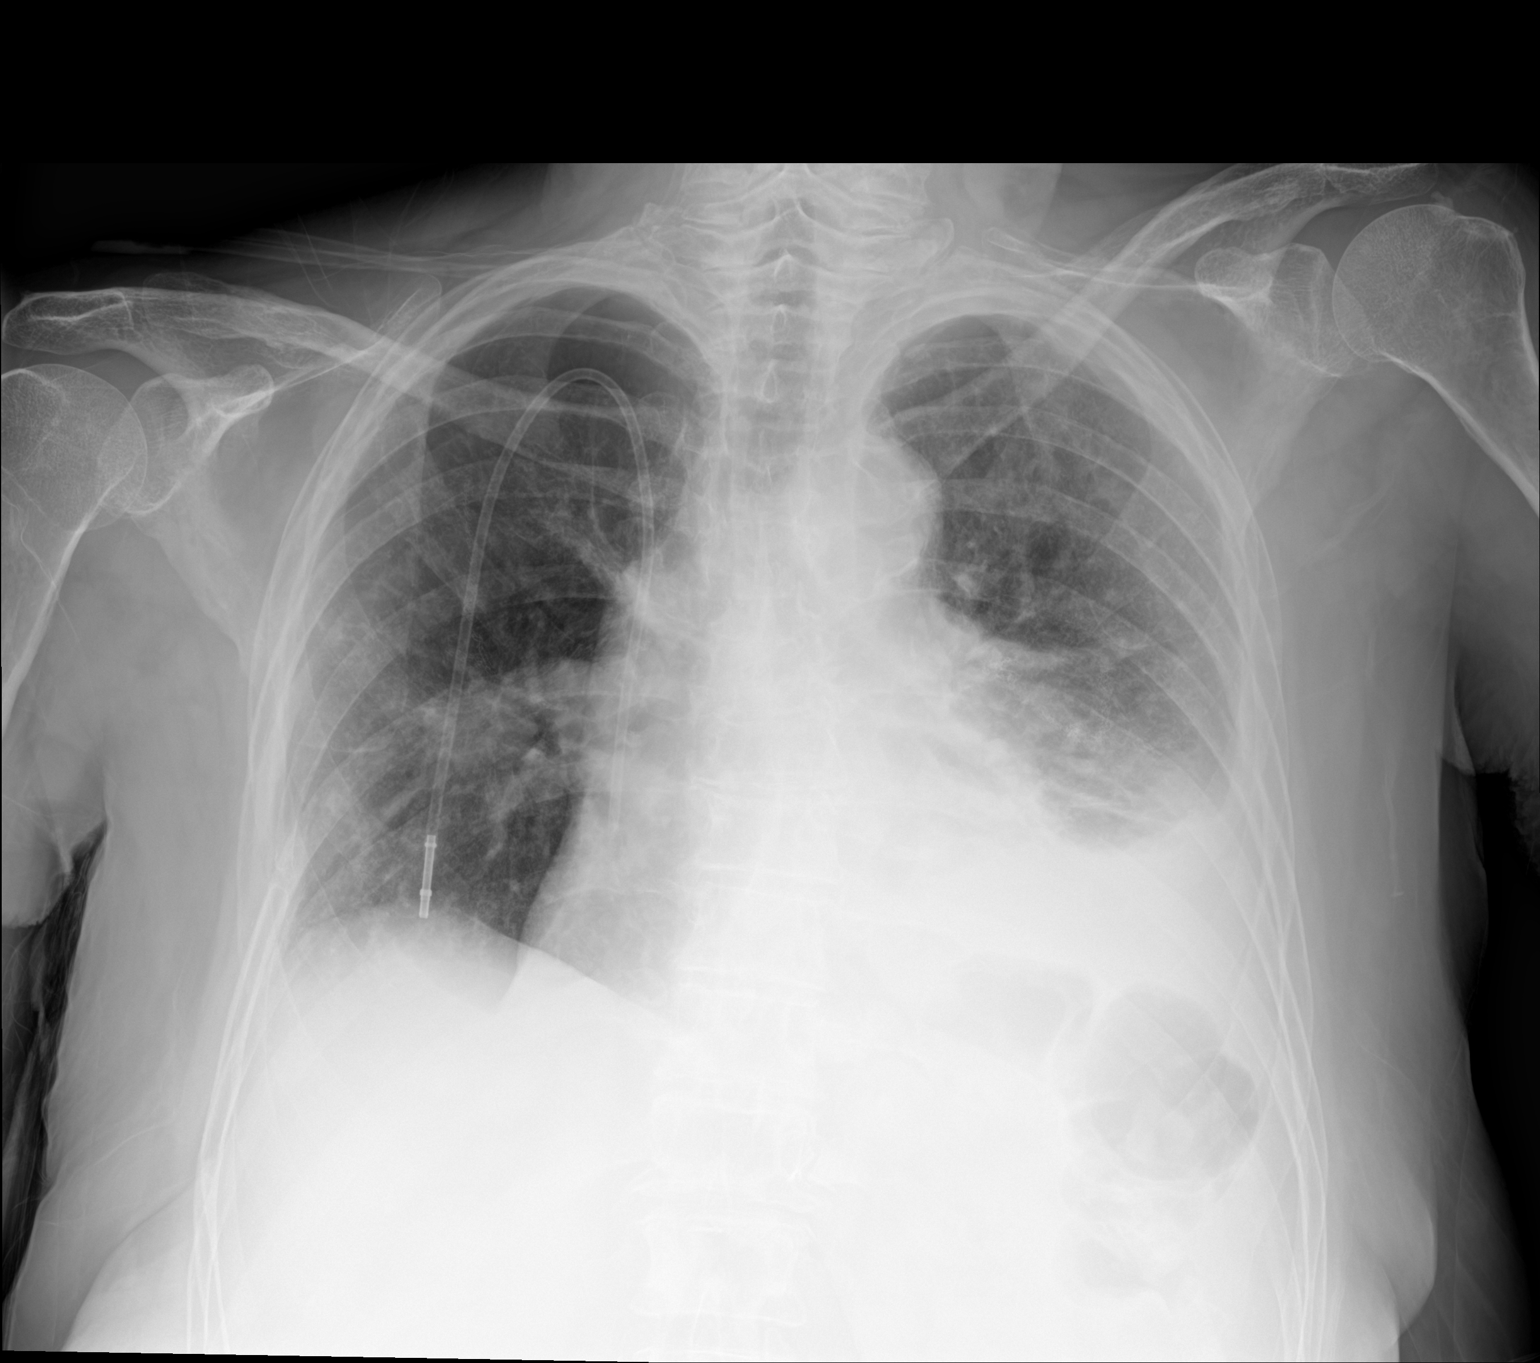

[2 of 2 positions shown; findings below may reference images not displayed]

FINDINGS: Compared to the prior study, volume of chronic left pleural fluid
appears similar. There is increased opacity in the adjacent left
lower lung which may be reflective of atelectasis or potentially an
acute infiltrate. No pneumothorax identified. Appearance of the
right lung is stable including residual mass-like opacity in the
perihilar region. The heart size is stable. Appearance of a porta
cath is also stable.
IMPRESSION: Stable volume of chronic left pleural fluid. Increased opacity in
the adjacent left lower lung may reflect atelectasis or potentially
infiltrate.

## 2016-11-27 IMAGING — US US THORACENTESIS ASP PLEURAL SPACE W/IMG GUIDE
1 series · 3 of 3 positions shown · non-contrast
Comparison: None.

MEDICATIONS:
None

COMPLICATIONS:
None immediate

INDICATION: Metastatic adenocarcinoma of lung, dyspnea, pleurisy, recurrent left
pleural effusion. Request is made for diagnostic and therapeutic
left thoracentesis.

EXAM:
ULTRASOUND GUIDED DIAGNOSTIC AND THERAPEUTIC LEFT THORACENTESIS
TECHNIQUE: Informed written consent was obtained from the patient after a
discussion of the risks, benefits and alternatives to treatment. A
timeout was performed prior to the initiation of the procedure.

[Series 1: us thoracentesis asp pleural space w/img guide · 0.22mm/px · 3 of 3 slices shown]
[im 1/3]
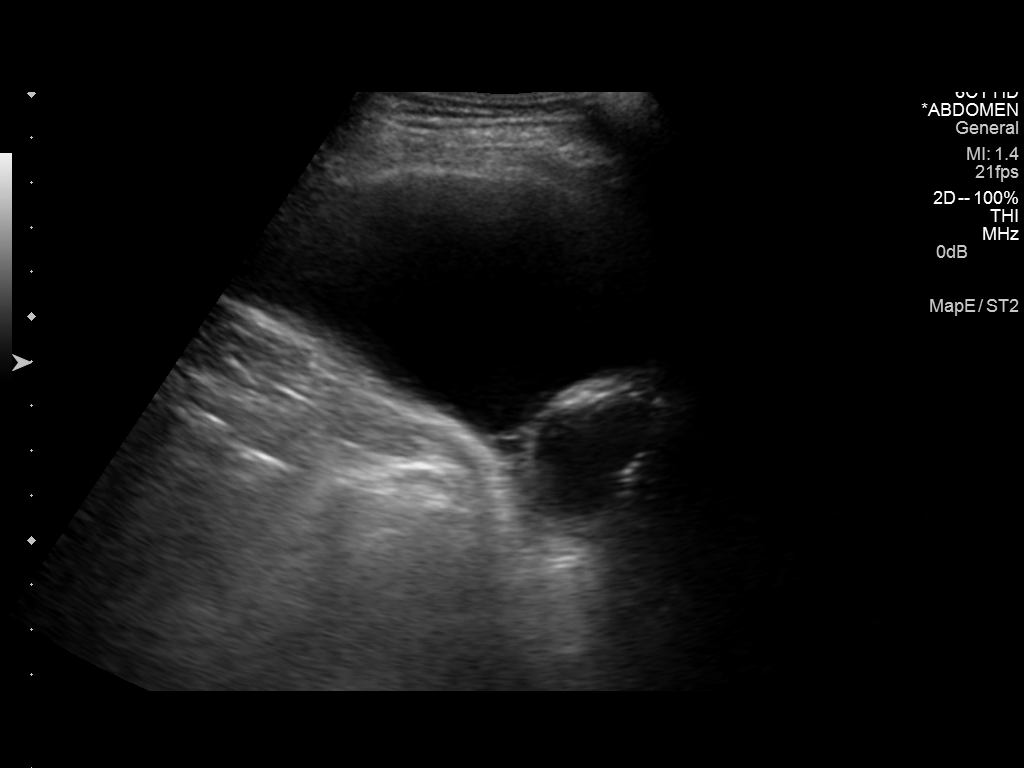
[im 2/3]
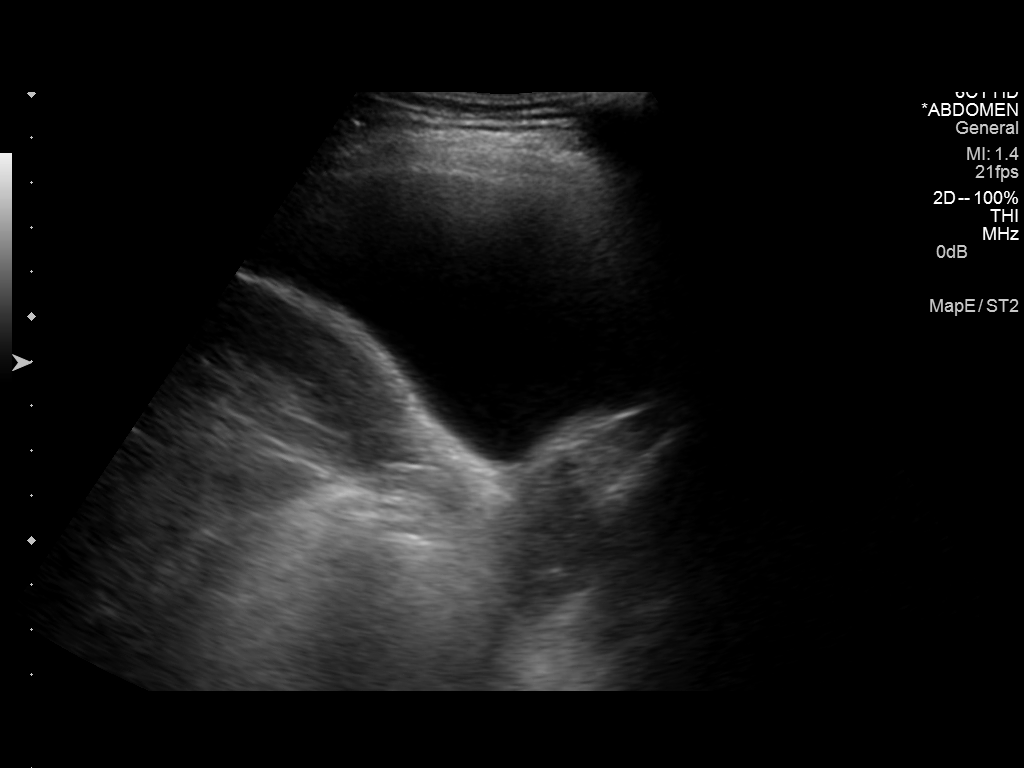
[im 3/3]
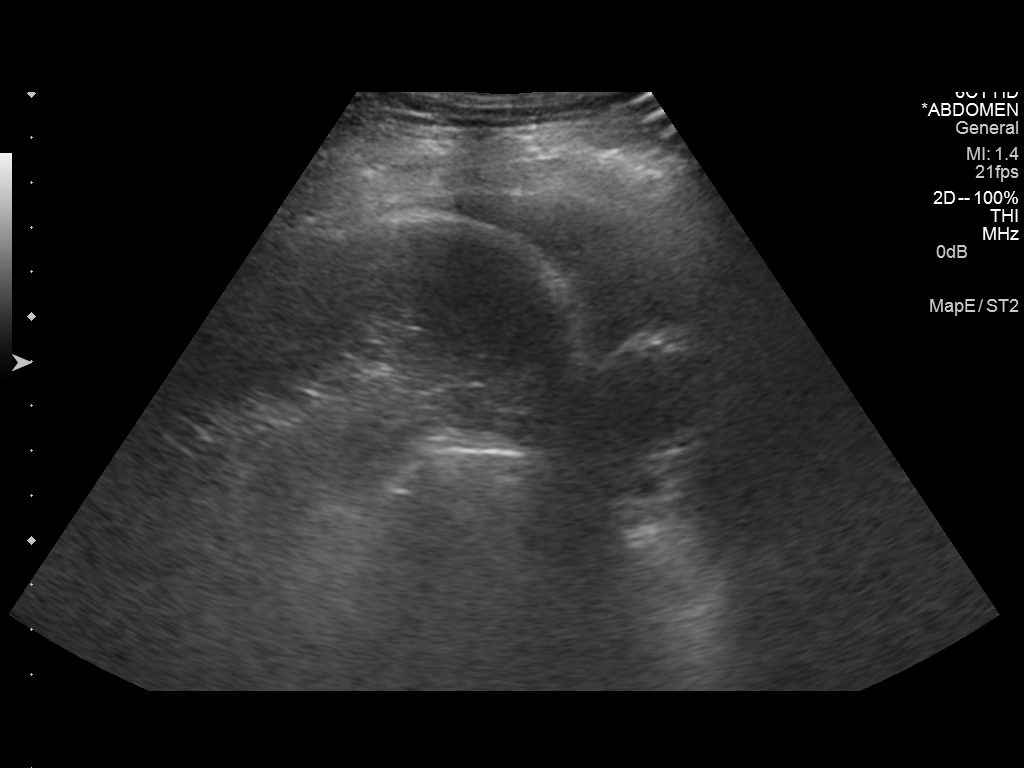

[3 of 3 positions shown; findings below may reference images not displayed]

Initial ultrasound scanning demonstrates a small to moderate left
pleural effusion. The lower chest was prepped and draped in the
usual sterile fashion. 1% lidocaine was used for local anesthesia.

An ultrasound image was saved for documentation purposes. A 6 Fr
Safe-T-Centesis catheter was introduced. The thoracentesis was
performed. The catheter was removed and a dressing was applied. The
patient tolerated the procedure well without immediate post
procedural complication. The patient was escorted to have an upright
chest radiograph.
FINDINGS: A total of approximately 350 cc of turbid, amber fluid was removed.
Requested samples were sent to the laboratory. Only the above amount
of fluid could be removed at this time secondary to patient's chest
discomfort .
IMPRESSION: Successful ultrasound-guided diagnostic and therapeutic left sided
thoracentesis yielding 350 cc of pleural fluid.

## 2016-11-27 IMAGING — DX DG CHEST 1V
1 series · 1 of 1 positions shown · non-contrast
Comparison: 01/11/2015

CLINICAL DATA: Post left thoracentesis

EXAM:
CHEST 1 VIEW

[chest pa]
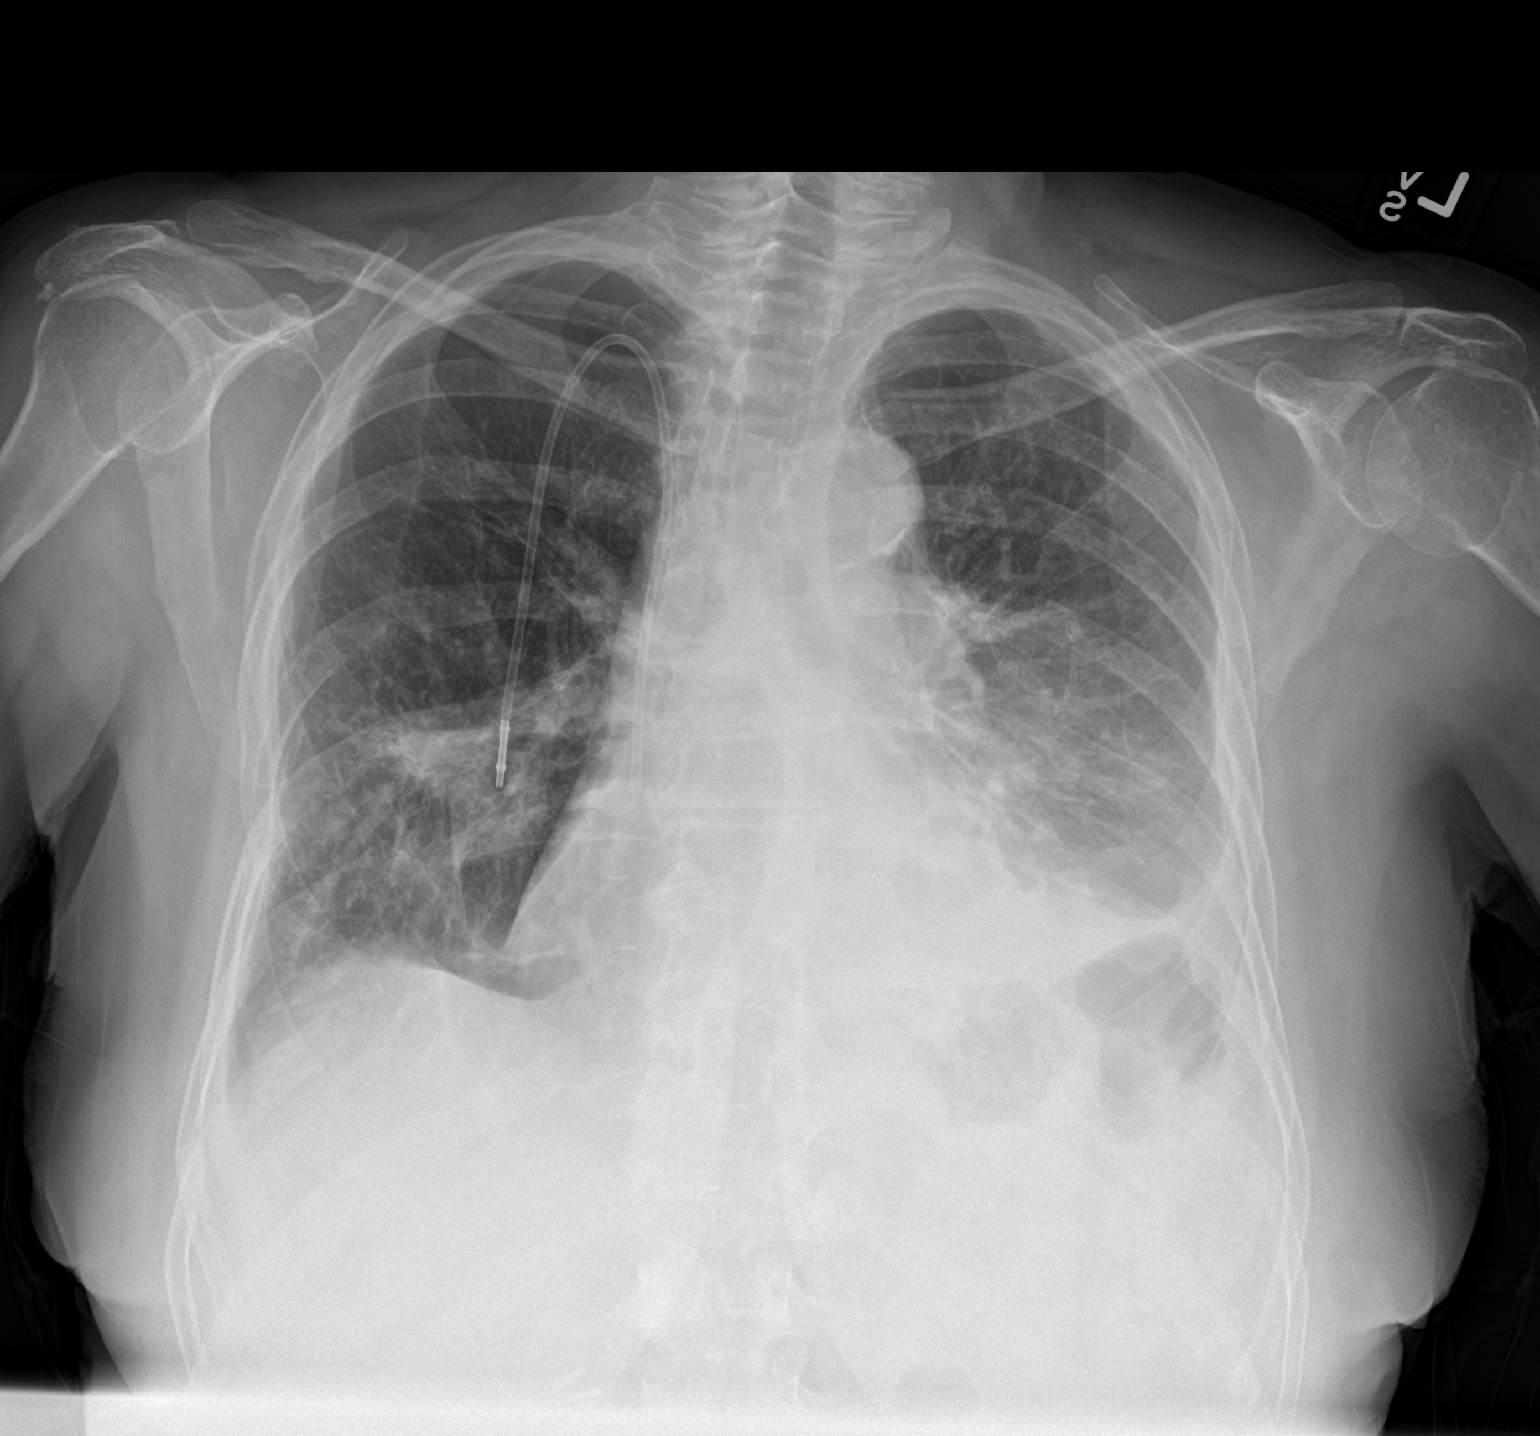

[1 of 1 positions shown; findings below may reference images not displayed]

FINDINGS: Cardiomediastinal silhouette is unremarkable. Stable right perihilar
triangular scarring and postsurgical changes. There is small
residual left pleural effusion with left basilar atelectasis or
infiltrate. No pulmonary edema. Postsurgical changes left perihilar
are stable. Right Port-A-Cath is unchanged in position. No
pneumothorax.
IMPRESSION: Stable right perihilar triangular scarring and postsurgical changes.
There is small residual left pleural effusion with left basilar
atelectasis or infiltrate. No pulmonary edema. No pneumothorax.

## 2016-12-06 IMAGING — DX DG CHEST 2V
2 series · 2 of 2 positions shown · non-contrast
Comparison: 01/13/2015

CLINICAL DATA: Recent left thoracentesis

EXAM:
CHEST - 2 VIEW

[chest pa]
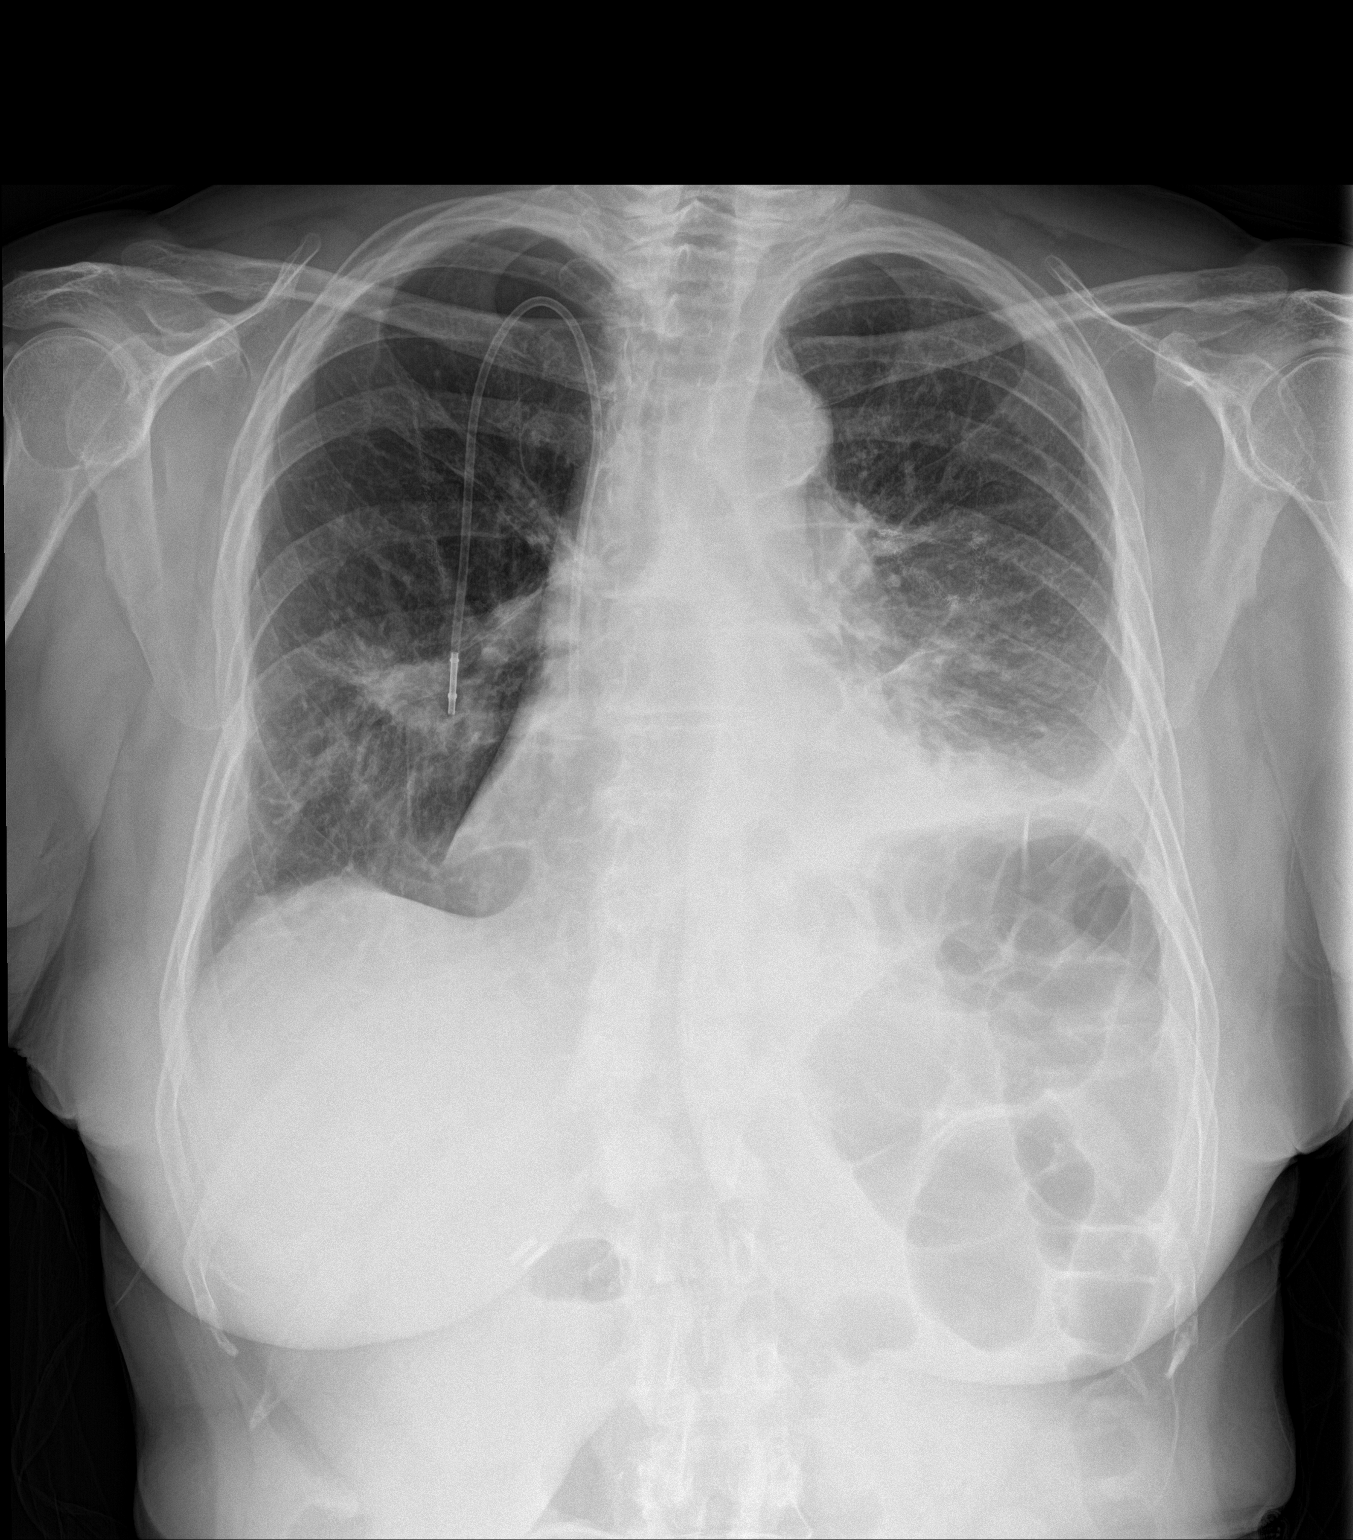

[chest lat]
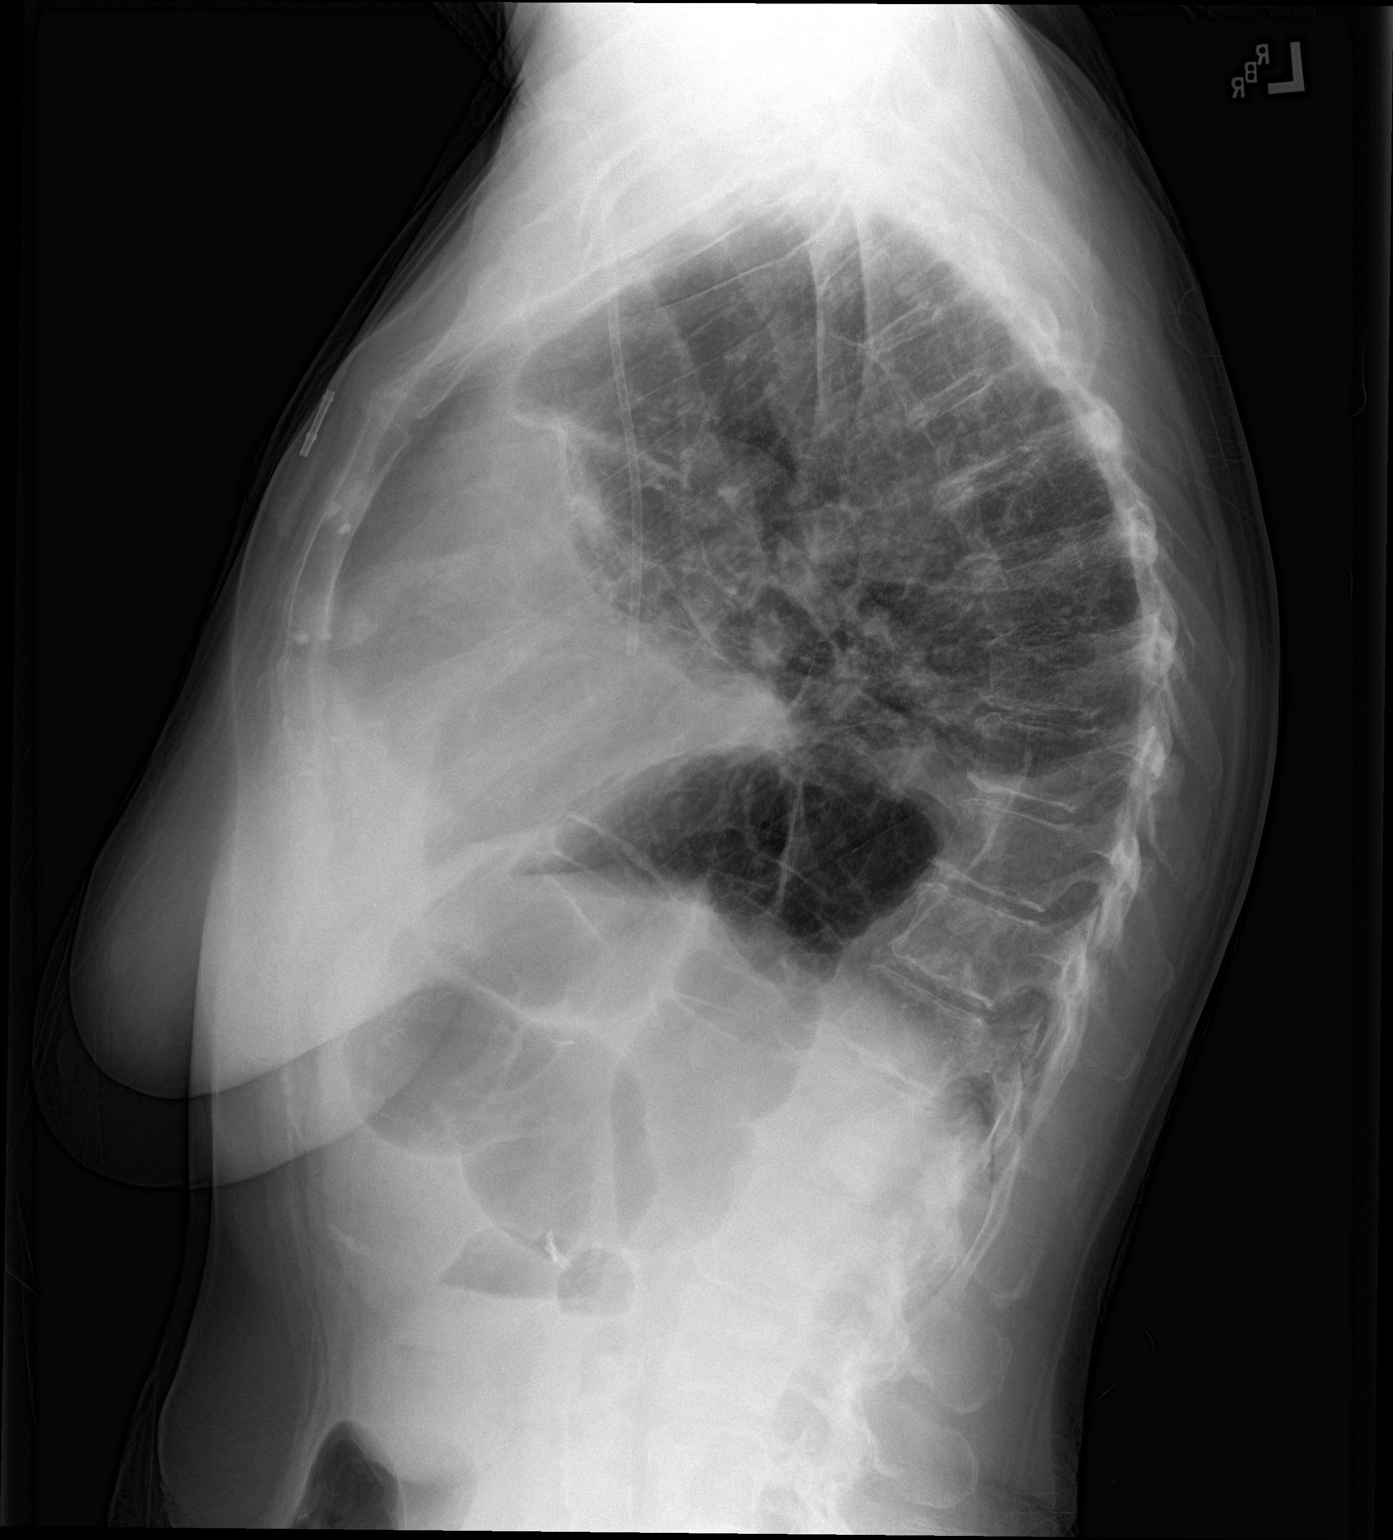

[2 of 2 positions shown; findings below may reference images not displayed]

FINDINGS: There is a slight reduction and left-sided pleural effusion. A right
chest wall port is again seen. Stable scarring is noted in the right
mid lung. No sizable right-sided effusion is noted. No acute bony
abnormality is seen.
IMPRESSION: Relatively stable appearance of the chest with some slight reduction
in left-sided pleural effusion following thoracentesis. No
pneumothorax is noted.

## 2016-12-25 IMAGING — DX DG CHEST 2V
2 series · 2 of 2 positions shown · non-contrast
Comparison: 01/22/2015 and earlier.

CLINICAL DATA: 64-year-old female with metastatic right lung cancer
status post chemotherapy. Right upper lobe carcinoma originally
diagnosed in 0448. Shortness of breath, right side rib pain, sternal
pain. Subsequent encounter.

EXAM:
CHEST  2 VIEW

[chest pa]
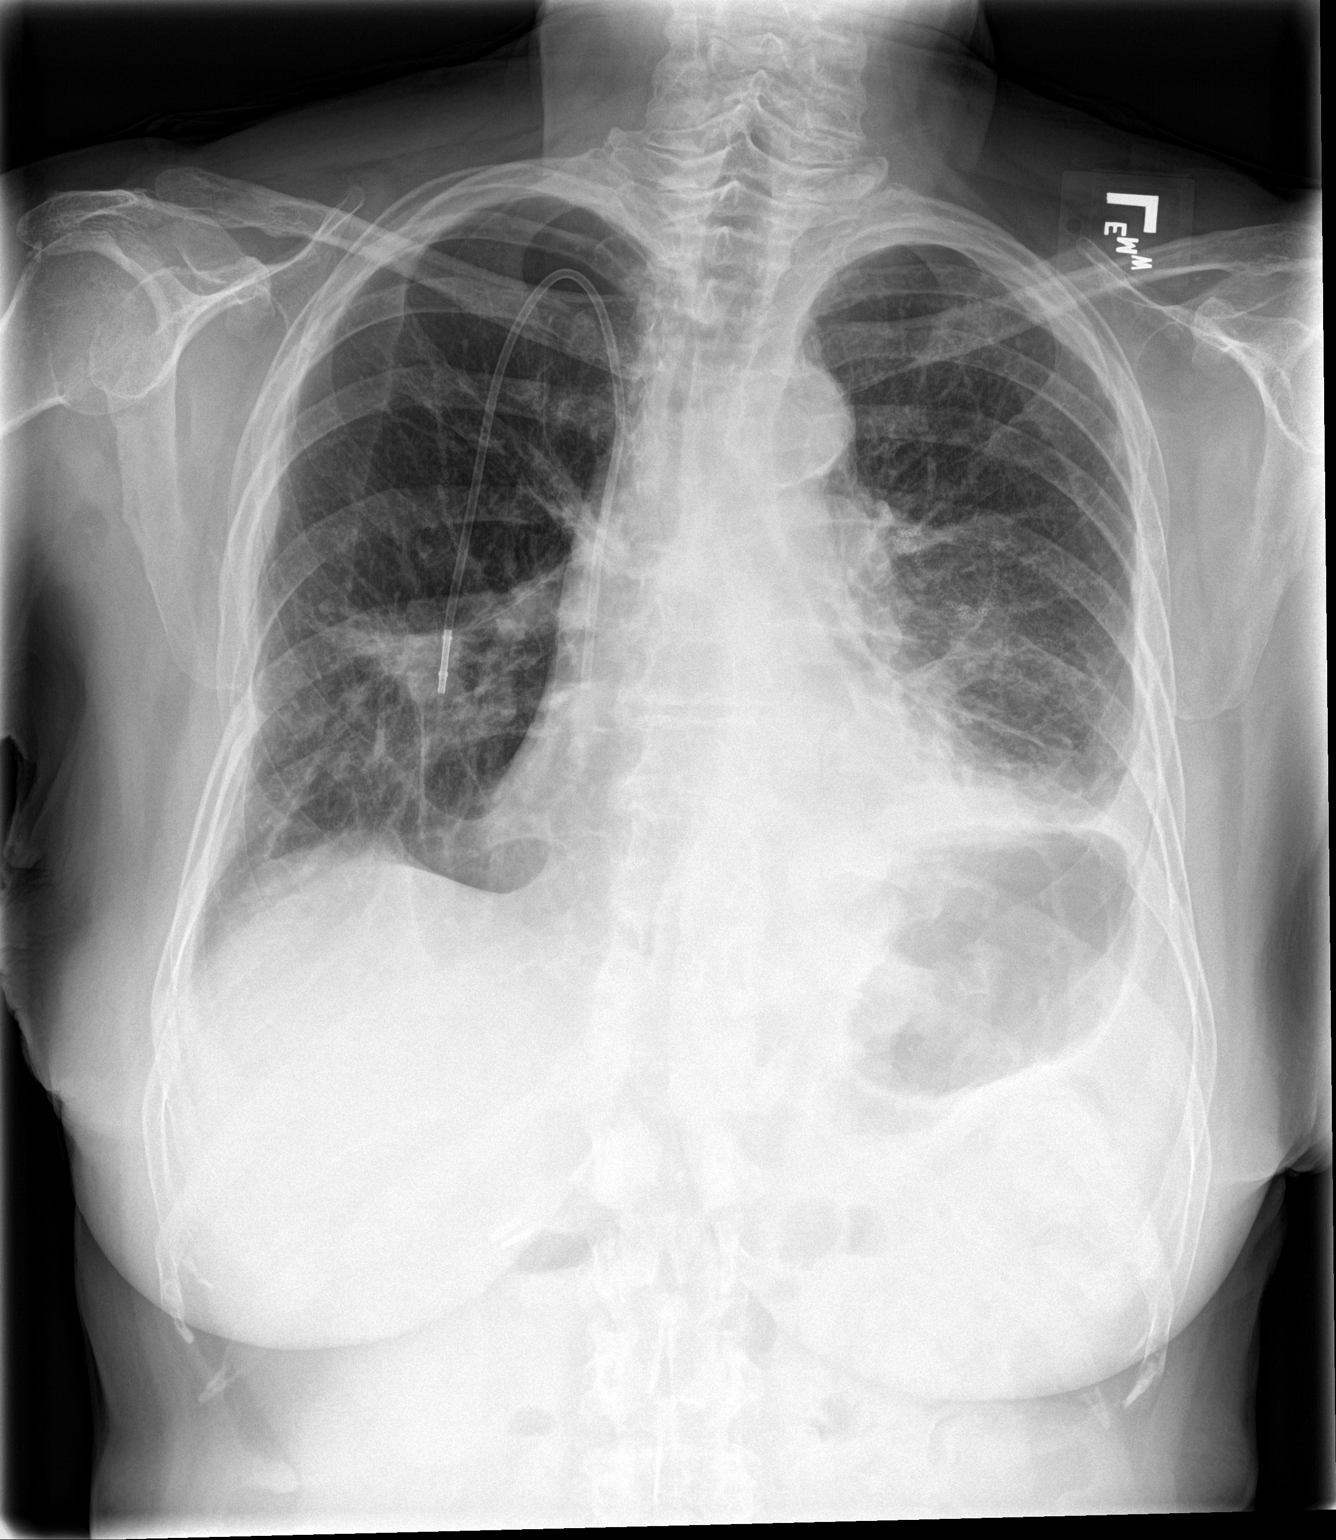

[chest lat]
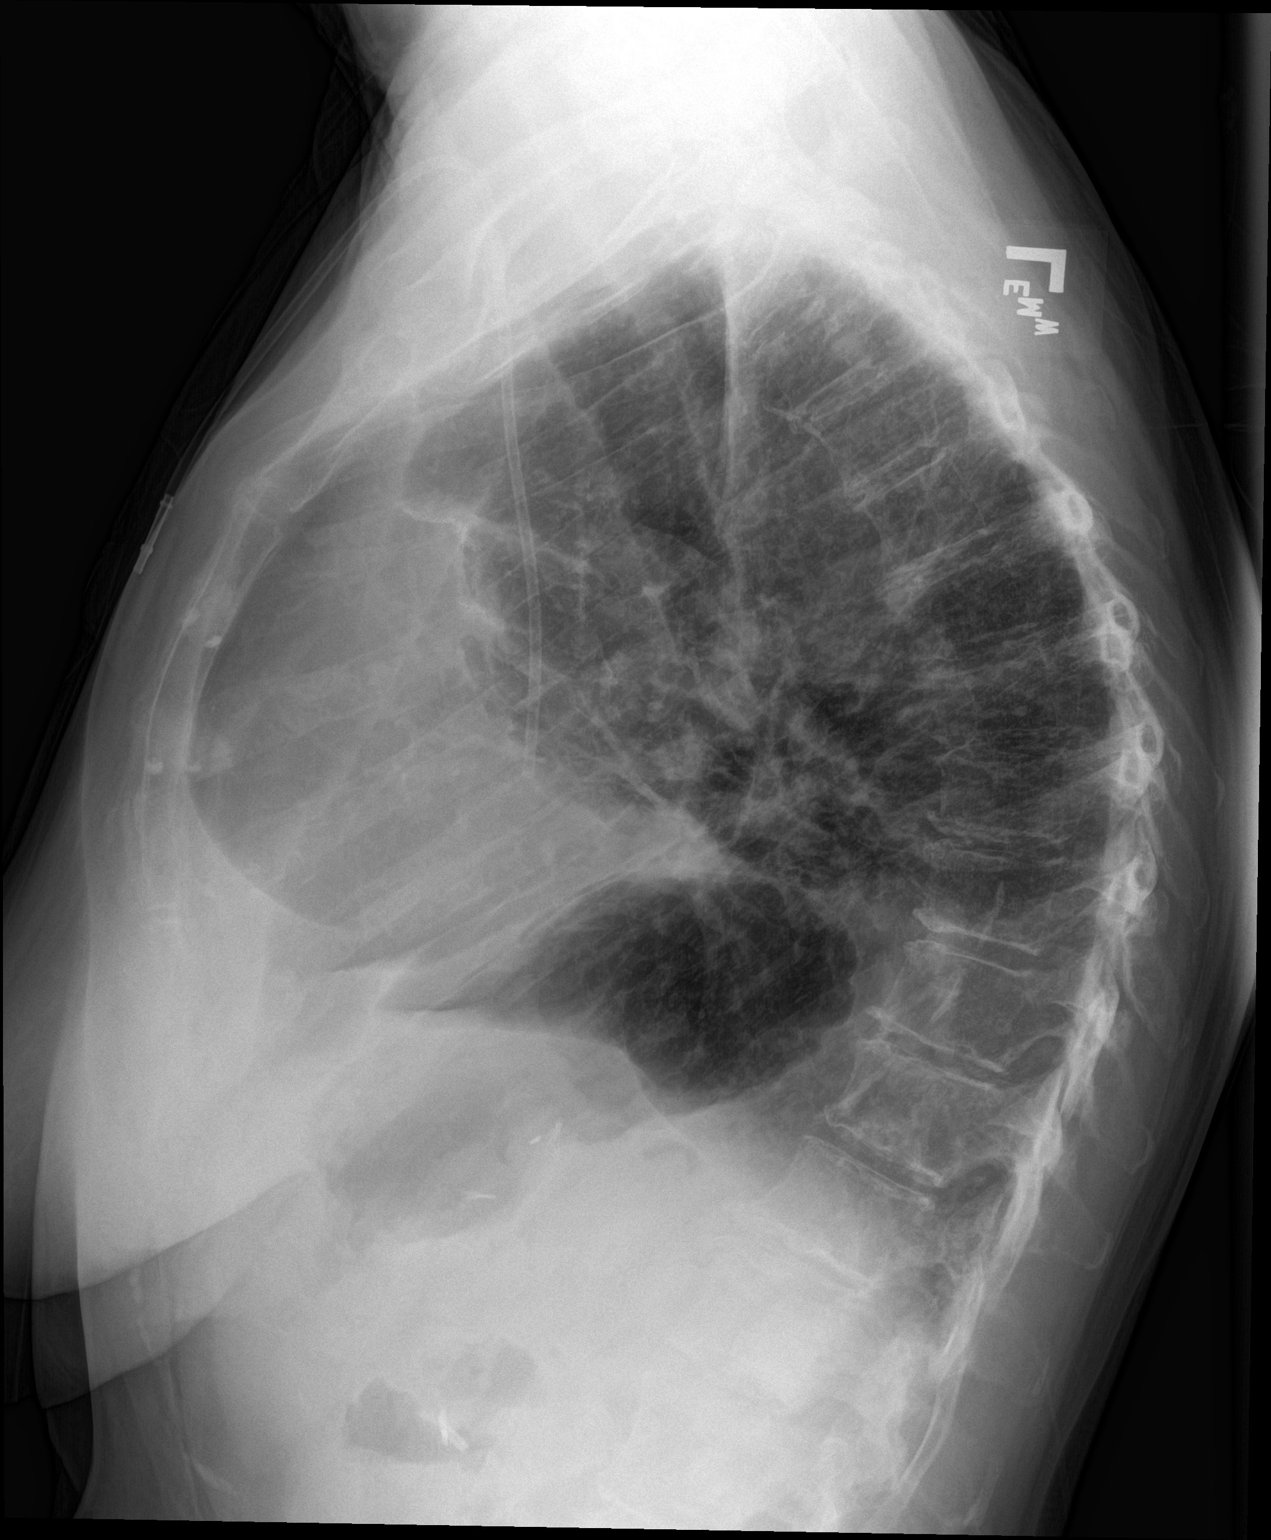

[2 of 2 positions shown; findings below may reference images not displayed]

FINDINGS: Right chest porta cath remains in place. Bilateral pulmonary
architectural distortion, maximal at the left lung base and right
mid lung. Lung volumes are stable since [REDACTED]. No definite
residual left pleural effusion. No pneumothorax or edema. No areas
of worsening ventilation. Stable cardiac size and mediastinal
contours. Chronic right lateral seventh rib fracture. Exaggerated
thoracic kyphosis. Stable visualized osseous structures. Stable
surgical clips in the upper abdomen.
IMPRESSION: Stable post treatment appearance of the chest since [REDACTED]. No new
cardiopulmonary abnormality.

## 2017-01-21 IMAGING — CT NM PET TUM IMG RESTAG (PS) SKULL BASE T - THIGH
8 series · 25 of 25 positions shown · non-contrast
Comparison: PET-CT 12/30/2014.

CLINICAL DATA: Subsequent treatment strategy for metastatic
adenocarcinoma of the lung. Restaging examination.

EXAM:
NUCLEAR MEDICINE PET SKULL BASE TO THIGH
TECHNIQUE: 6.2 mCi F-18 FDG was injected intravenously. Full-ring PET imaging
was performed from the skull base to thigh after the radiotracer. CT
data was obtained and used for attenuation correction and anatomic
localization.
FASTING BLOOD GLUCOSE:  Value: 81 mg/dl

[Series 3: pet sk_thigh ac · axial · 5.0mm · 4.07mm/px · z∈[-1135,-323]mm · 5 of 204 slices shown]
[im 1/204]
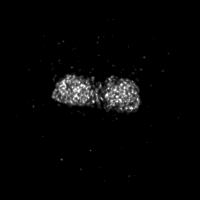
[im 51/204]
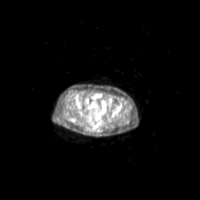
[im 102/204]
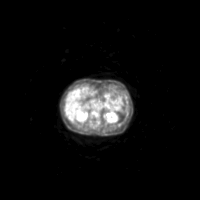
[im 153/204]
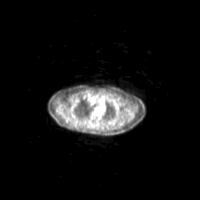
[im 204/204]
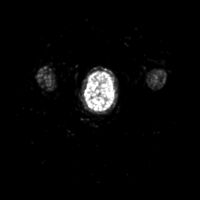

[Series 4: ct sk_thigh 5.0 b31f · axial · 5.0mm · 0.98mm/px · z∈[-1135,-323]mm · 5 of 204 slices shown]
[im 1/204]
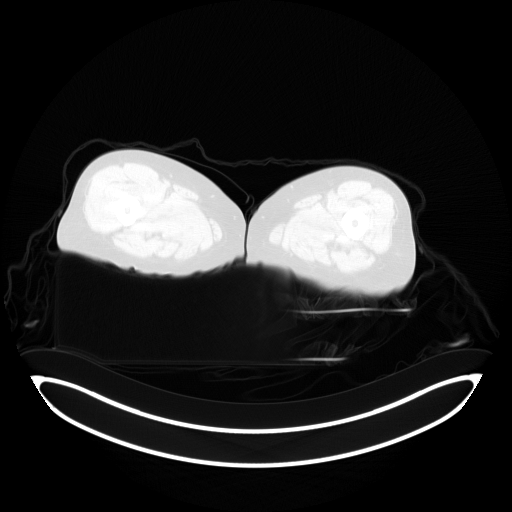
[im 51/204]
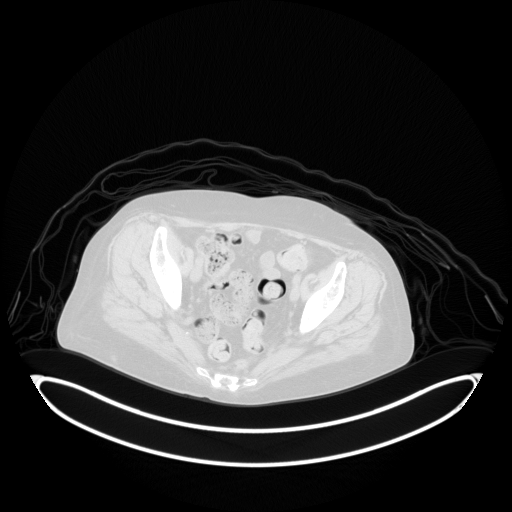
[im 102/204]
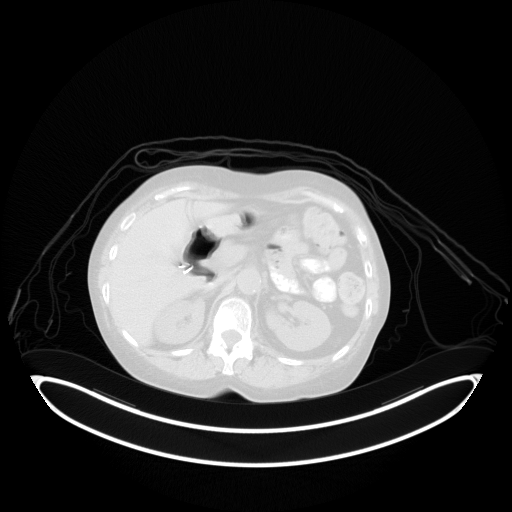
[im 153/204]
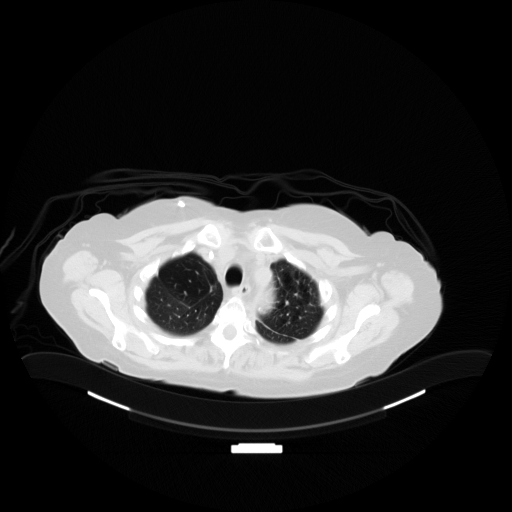
[im 204/204  brain]
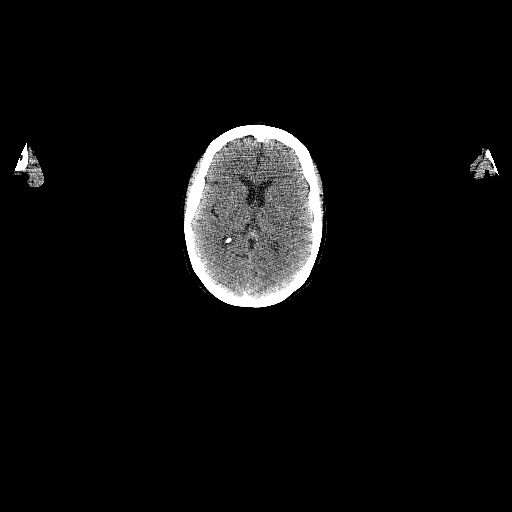

[Series 6: ct sk_thigh 5.0 b70f (id)_bone · axial · 5.0mm · 0.57mm/px · 1 of 54 slices shown]
[im 1/54  bone]
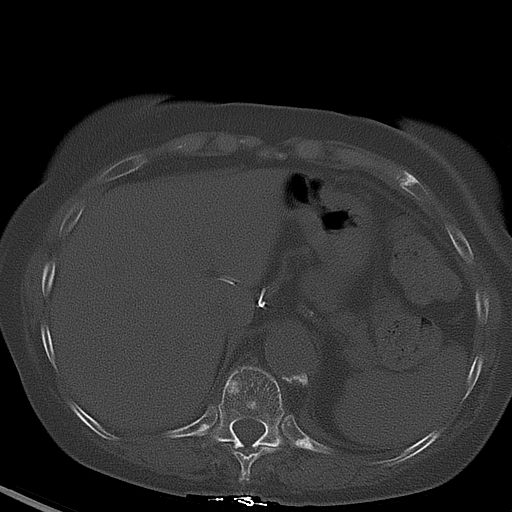

[Series 8: pet sk_thigh nac · axial · 5.0mm · 4.07mm/px · z∈[-1135,-323]mm · 5 of 204 slices shown]
[im 1/204]
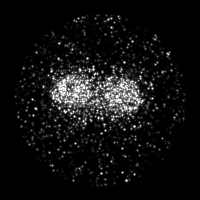
[im 51/204]
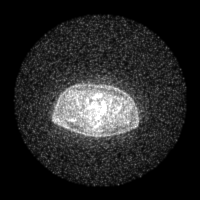
[im 102/204]
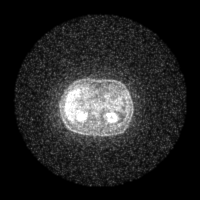
[im 153/204]
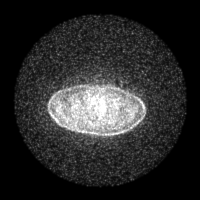
[im 204/204]
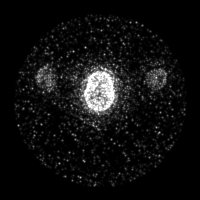

[Series 604: mip collection<mip range> · coronal · 1.69mm/px · 1 of 32 slices shown]
[im 1/32]
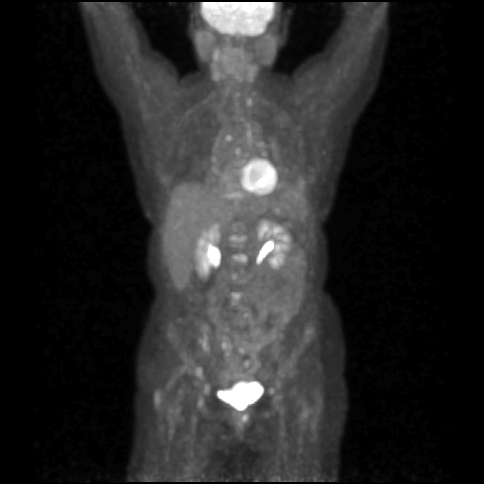

[Series 605: range-ct sk_thigh 5.0 (id)<alpha range> · 2 of 91 slices shown (1 of 2)]
[im 1/91]
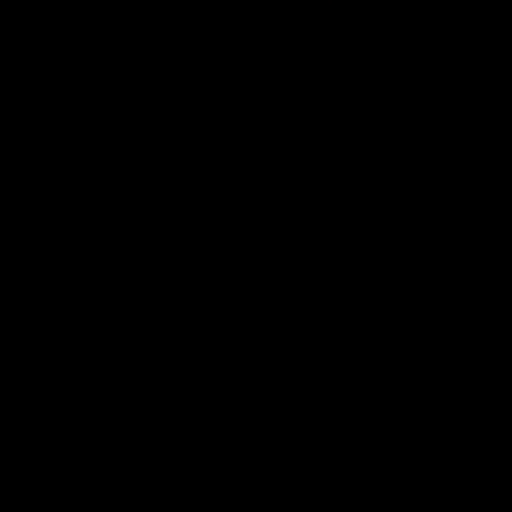
[im 91/91]
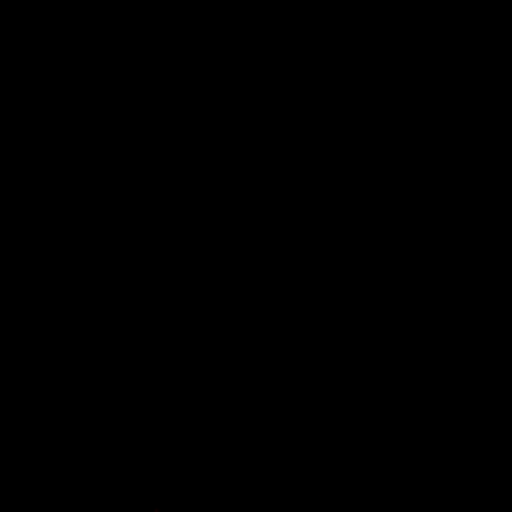

[Series 606: range-ct sk_thigh 5.0 (id)<alpha range> · 5 of 190 slices shown (2 of 2)]
[im 1/190]
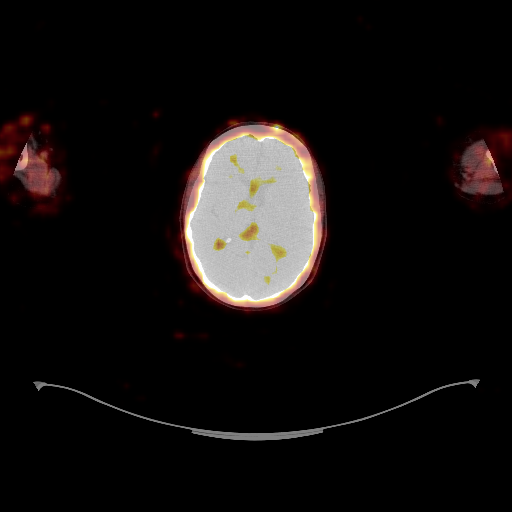
[im 48/190]
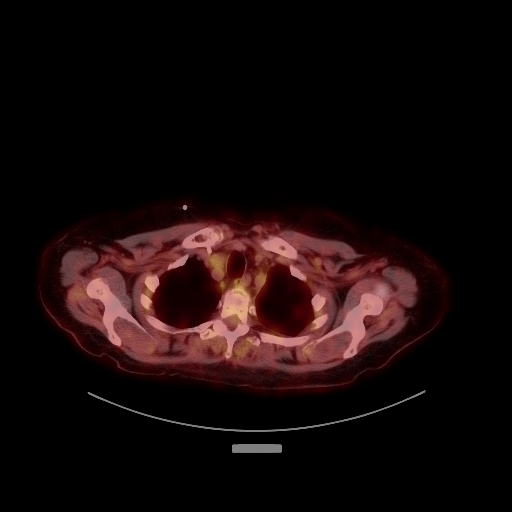
[im 95/190]
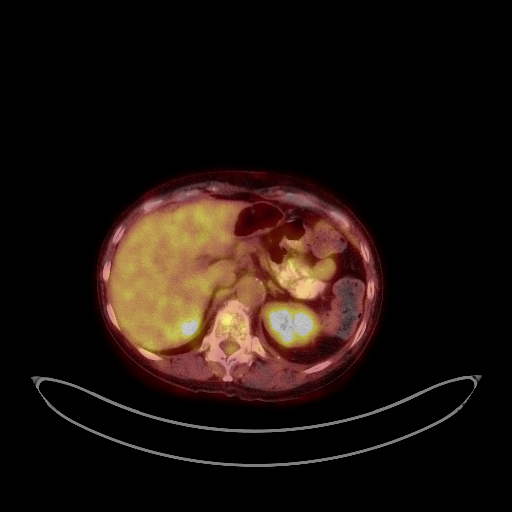
[im 142/190]
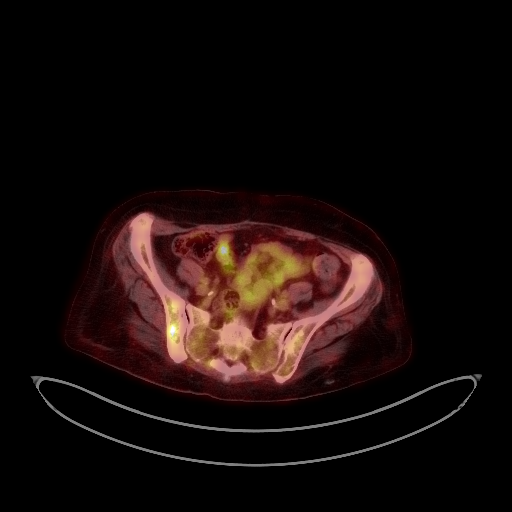
[im 190/190]
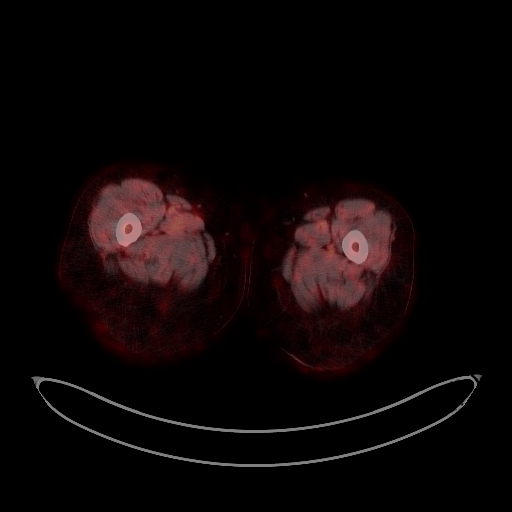

[Series 1365: results mm oncology reading · 1.01mm/px · 1 of 26 slices shown]
[im 1/26]
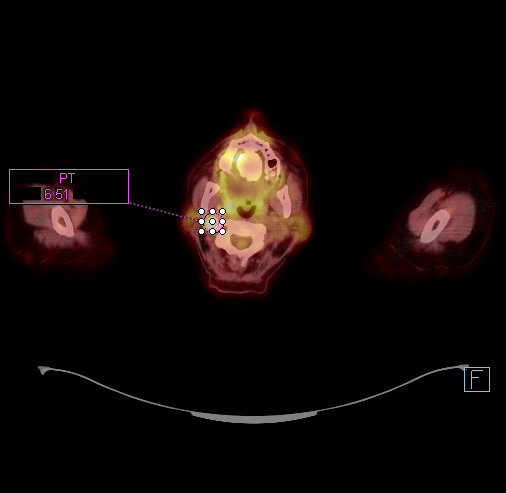

[25 of 25 positions shown; findings below may reference images not displayed]

FINDINGS: NECK

Enlarged 1.5 cm right-sided level 2 cervical lymph node remains
hypermetabolic (SUVmax = 6.5). Mildly enlarged left supraclavicular
lymph node measures 1 cm in short axis and remains hypermetabolic
(SUVmax = 5.8).

CHEST

Multiple mediastinal and bilateral hypermetabolic (SUVmax = 4.2-6.5)
lymph nodes are again noted, borderline to mildly enlarged. While
some of these appear similar to prior examinations, others appear
more hypermetabolic (example in the right hilar region where SUV max
is now 4.5 versus 3.9 on the prior study), and still others appear
slightly less hypermetabolic (example in the right paratracheal
lymph node demonstrates an SUV max of 6.5 versus 8.5 on the prior
study). Previously noted left pleural effusion has significantly
decreased in size, now very small and partially loculated in the
base of the left hemithorax. Several areas of pleural thickening and
mild pleural nodularity are noted in the left hemithorax, most
evident in the posterior aspect of the base of the left hemithorax
where there is some pleural-based hypermetabolism (SUVmax = 4.6).
The continues to be extensive architectural distortion adjacent to
some sutures in the anterior aspect of the left upper lobe, with
some low-level hypermetabolic activity throughout this region
(SUVmax = 4.5). Several other linear areas of architectural
distortion are again noted throughout the lungs bilaterally, similar
to the prior study. Small cluster of sub cm nodules in the superior
segment of the right lower lobe, similar to the prior examination,
measuring up to 5 mm (image 17 of series 6). Heart size is normal.
Atherosclerotic calcifications are noted in the left main, left
anterior descending, left circumflex and right coronary arteries.

ABDOMEN/PELVIS

No abnormal hypermetabolic activity within the liver, pancreas,
adrenal glands, or spleen. No hypermetabolic lymph nodes in the
abdomen or pelvis. Status post cholecystectomy. Atherosclerotic
calcifications are noted throughout the abdominal and pelvic
vasculature, without definite aneurysm. No significant volume of
ascites. No pneumoperitoneum.

SKELETON

Numerous predominantly sclerotic lesions are again noted throughout
the axial and appendicular skeleton, very similar in size, number
and distribution to the prior examination, compatible with
widespread metastatic disease to the bones. Many these lesions
demonstrate no hypermetabolism, however, there are several
hypermetabolic foci noted on today's examination most notably in the
T12 (SUVmax = 7.3) and L1 vertebral bodies (SUVmax = 8.0). The
overall burden of metastatic disease to the bones appears very
similar to the prior examination.
IMPRESSION: 1. Overall, today's study demonstrates essentially stable metastatic
disease throughout the lymph nodes of the neck and thorax, the left
pleural space, left lung and skeleton, as detailed above. While some
areas appear slightly decreased in hypermetabolism compared to the
prior study, other areas appear slightly increased, as detailed
above. No new metastatic lesions are confidently identified.
2. Malignant left pleural effusion has decreased in size compared to
the prior study, now all small and partially loculated in the base
of the left hemithorax.
3. Additional incidental findings, as above.

## 2017-02-16 IMAGING — DX DG CHEST 1V PORT
1 series · 1 of 1 positions shown · non-contrast
Comparison: 02/10/2015

CLINICAL DATA: Chest pain, history of lung carcinoma

EXAM:
PORTABLE CHEST 1 VIEW

[chest ap]
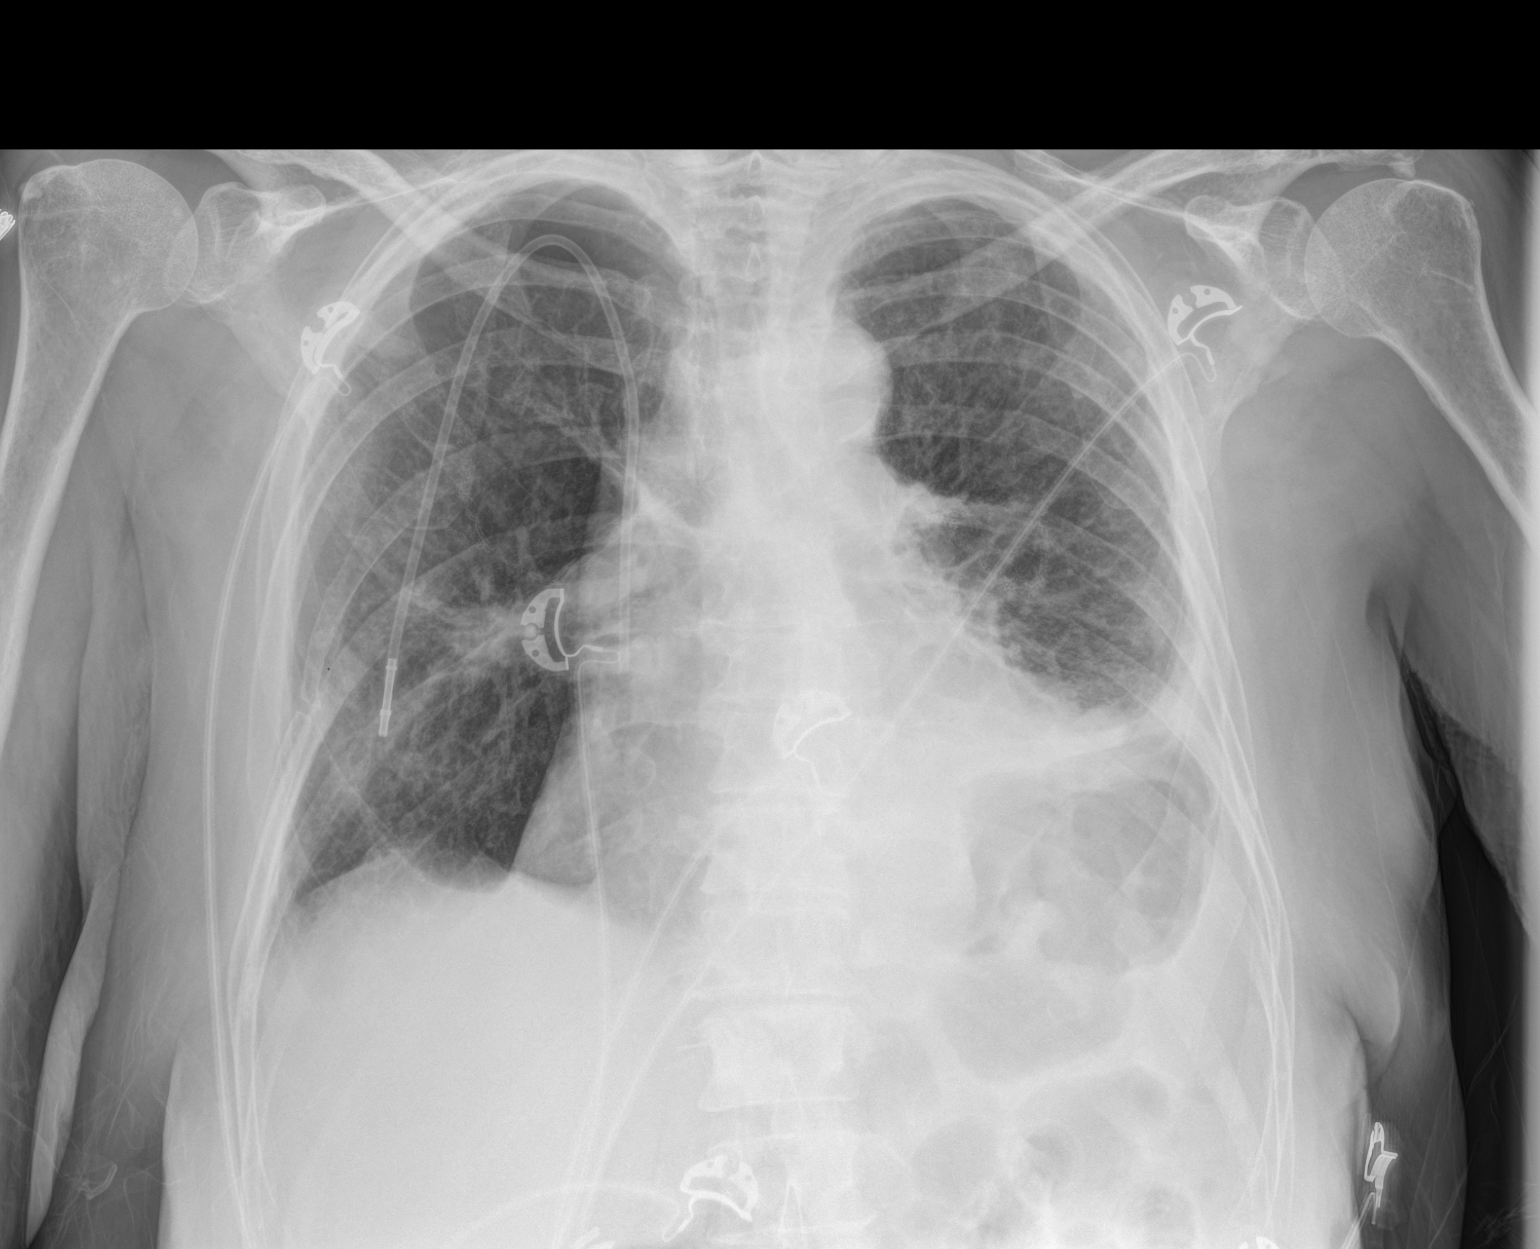

[1 of 1 positions shown; findings below may reference images not displayed]

FINDINGS: Cardiac shadow is stable. A right chest wall port is again seen and
stable. Chronic elevation of left hemidiaphragm is seen. Stable
density in the right mid lung is again seen no new focal infiltrate
or sizable effusion is noted. No acute bony abnormality is noted.
IMPRESSION: No acute abnormality seen.

## 2017-03-06 IMAGING — CR DG CHEST 2V
2 series · 2 of 2 positions shown · non-contrast
Comparison: Chest x-ray 04/04/2015, CT chest 04/04/2015

CLINICAL DATA: 64-year-old female with a history of lung carcinoma.
Potential pleural effusion.

EXAM:
CHEST - 2 VIEW

[w chest pa]
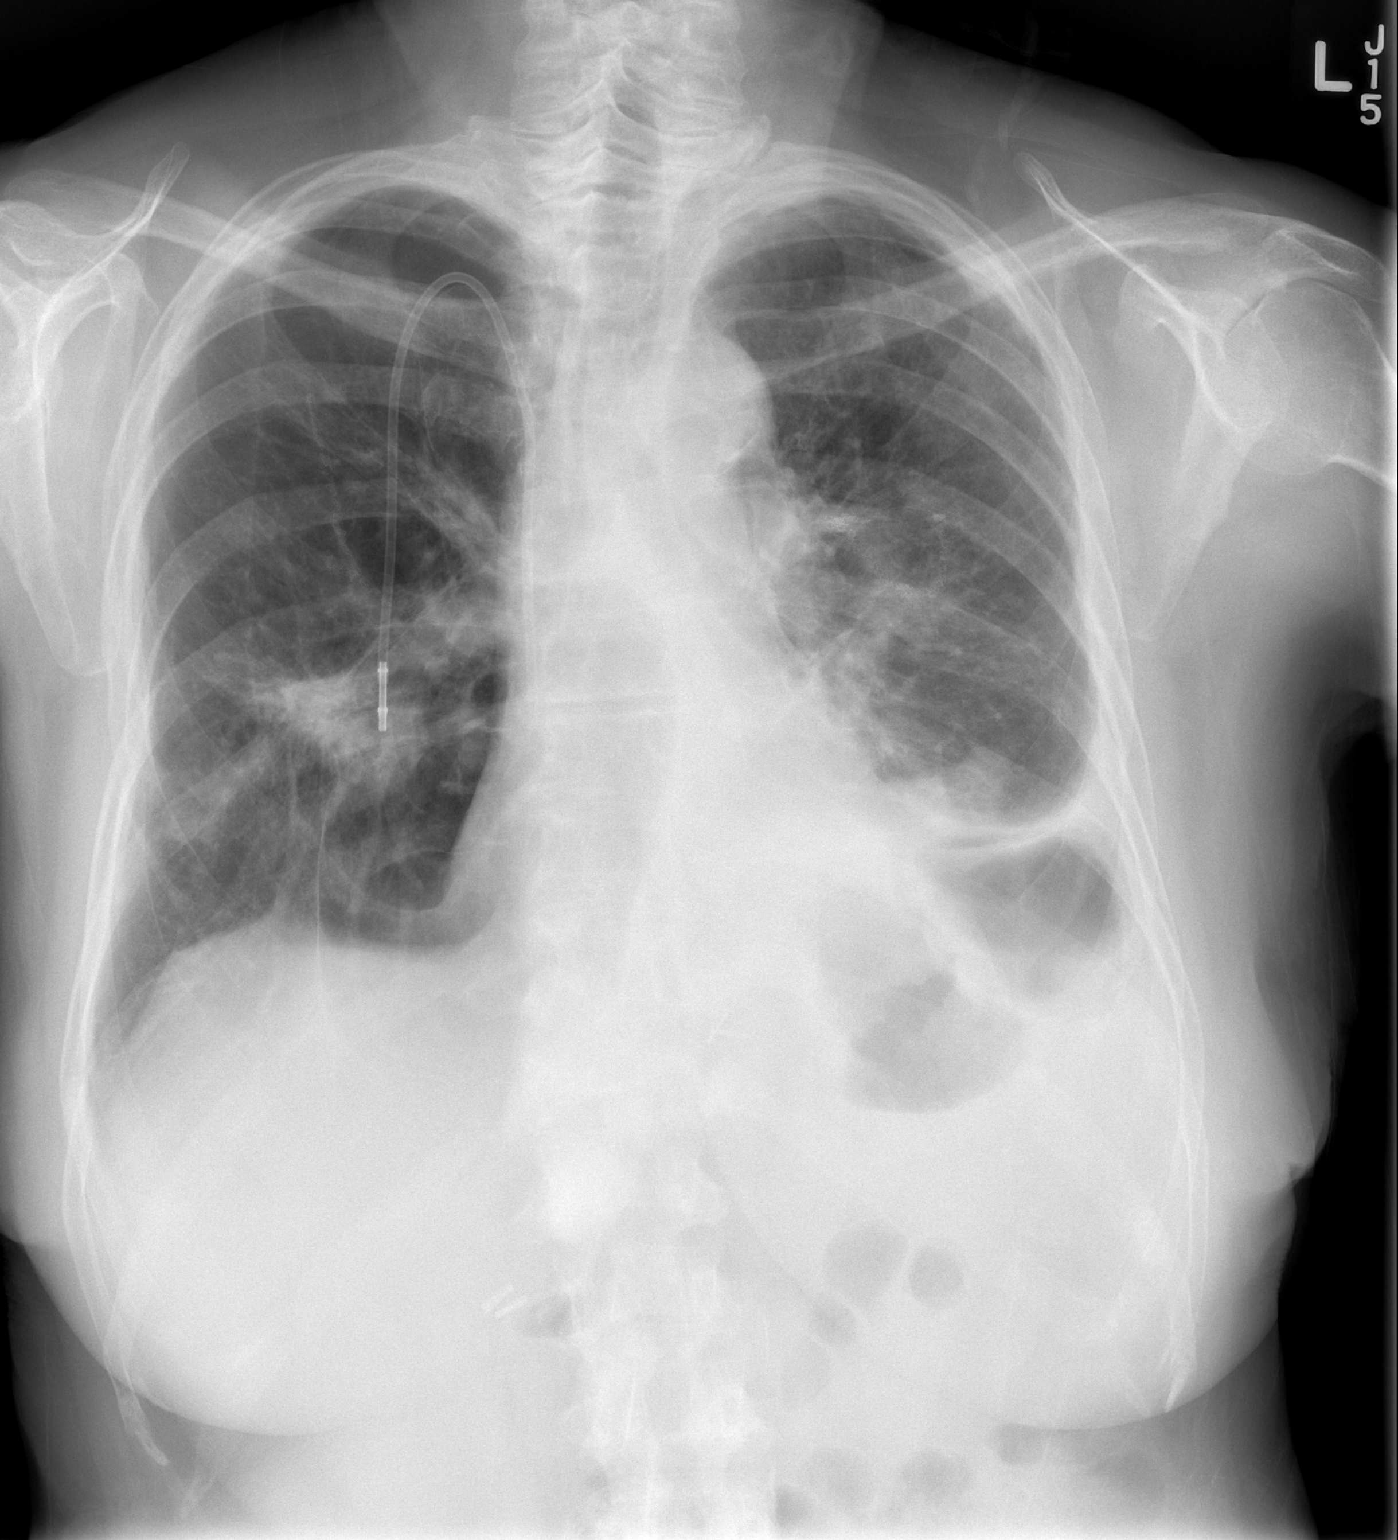

[w chest lat]
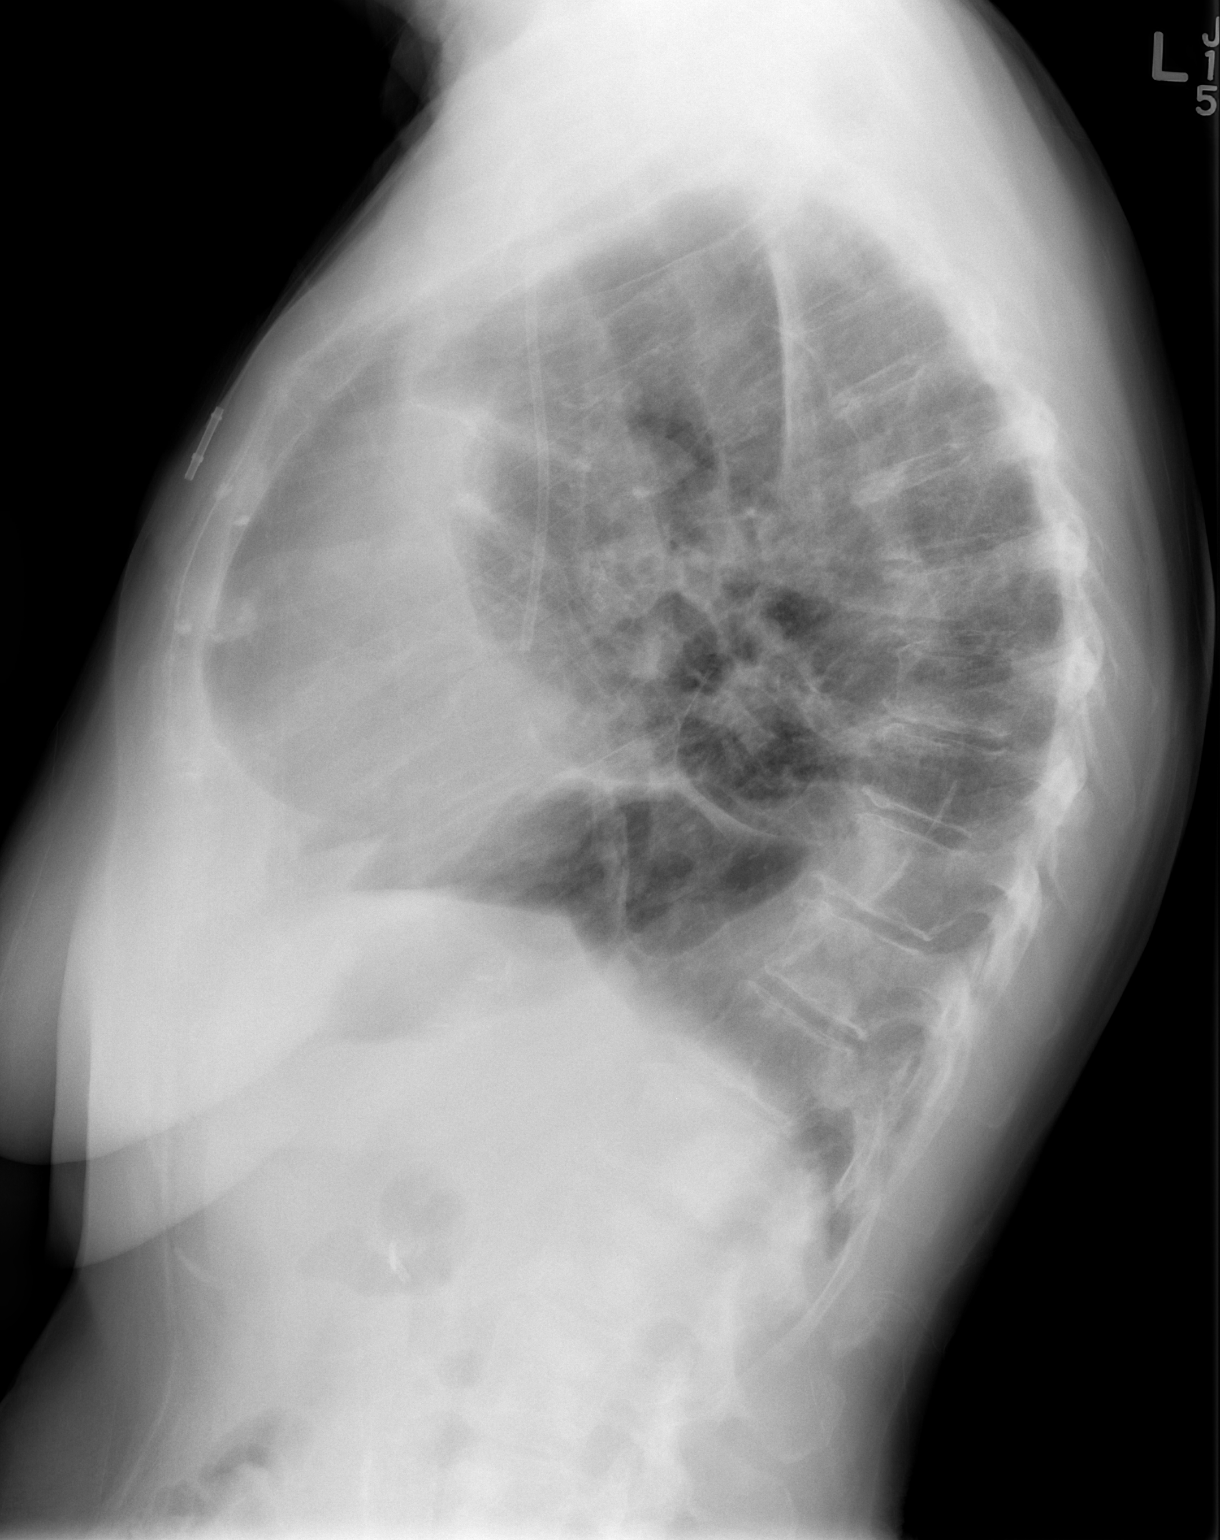

[2 of 2 positions shown; findings below may reference images not displayed]

FINDINGS: Cardiomediastinal silhouette unchanged in size and contour. Left
heart border somewhat obscured by overlying lung and pleural
disease.

Similar appearance of asymmetric elevation of the left
hemidiaphragm, with interposition of colonic gas subjacent to the
diaphragm.

Calcifications of the aortic arch.

Density in the right mid lung, more pronounced than the comparison
plain film with adjacent architectural distortion.

Surgical changes of the left upper lung.

Similar appearance of interlobular septal thickening and
interstitial opacities.

No pneumothorax.

Unchanged position of right IJ port catheter

No displaced fracture. Bony metastatic disease is better
demonstrated on prior CT and PET-CT.
IMPRESSION: Similar appearance of the chest x-ray to the comparison, with
persistent left basilar opacity. Given the relative absence of
pleural fluid on the CT chest 04/04/2015, this is favored to be
chronic scarring, treatment changes/ surgical changes, and no
significant increase in pleural fluid. If there is concern for more
specific assessment, a limited ultrasound may be considered.

Nodularity of the right chest is more pronounced than the
comparison, with associated architectural distortion, potentially
treatment changes, atelectasis/scarring, focus of infection, or
tumor.

Atherosclerosis.

Unchanged right IJ port catheter.

## 2017-03-22 IMAGING — CT NM PET TUM IMG RESTAG (PS) SKULL BASE T - THIGH
8 series · 25 of 25 positions shown · non-contrast
Comparison: 03/09/2015

CLINICAL DATA: Subsequent treatment strategy for Lung cancer.

EXAM:
NUCLEAR MEDICINE PET SKULL BASE TO THIGH
TECHNIQUE: 5.89 mCi F-18 FDG was injected intravenously. Full-ring PET imaging
was performed from the skull base to thigh after the radiotracer. CT
data was obtained and used for attenuation correction and anatomic
localization.
FASTING BLOOD GLUCOSE:  Value: 83 mg/dl

[Series 3: pet sk_thigh ac · axial · 5.0mm · 4.07mm/px · z∈[-728,+68]mm · 5 of 200 slices shown]
[im 1/200]
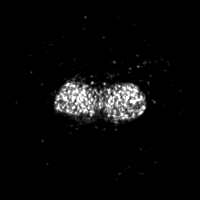
[im 50/200]
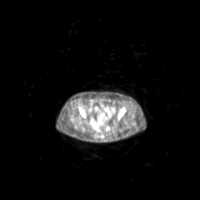
[im 100/200]
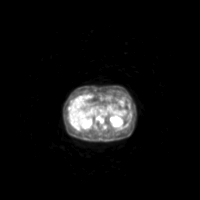
[im 150/200]
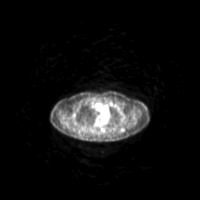
[im 200/200]
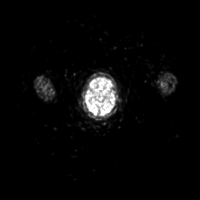

[Series 4: ct sk_thigh 5.0 b31f · axial · 5.0mm · 0.92mm/px · z∈[-728,+68]mm · 5 of 198 slices shown]
[im 1/198]
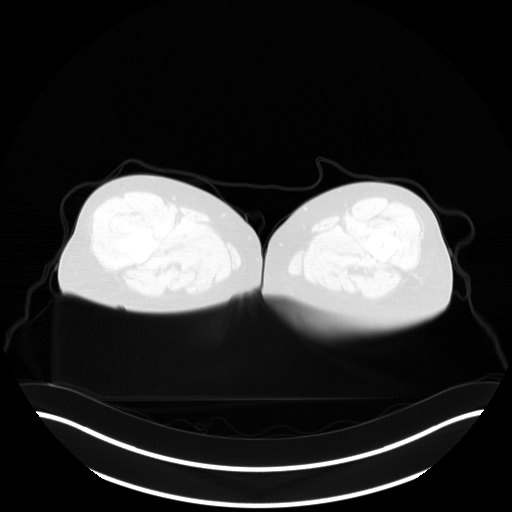
[im 50/198]
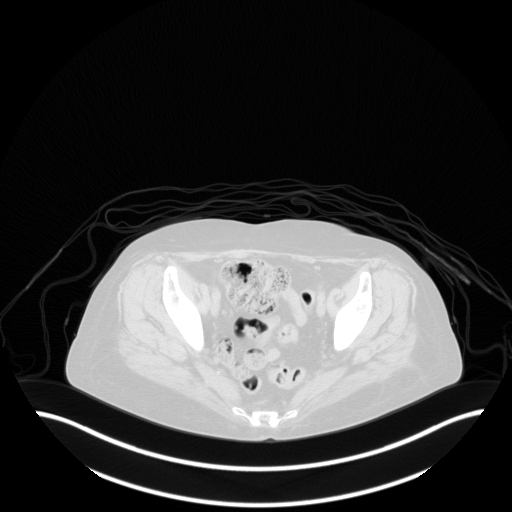
[im 99/198]
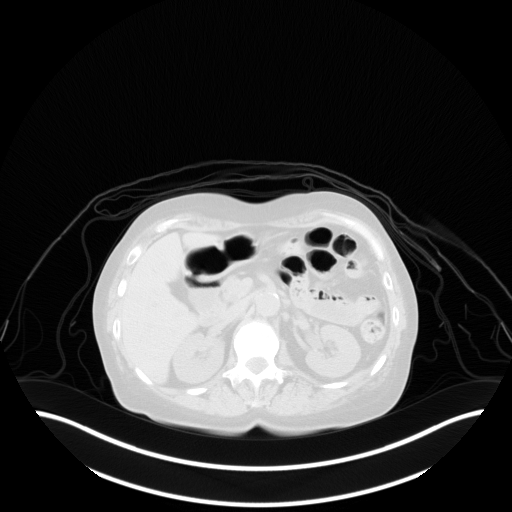
[im 148/198]
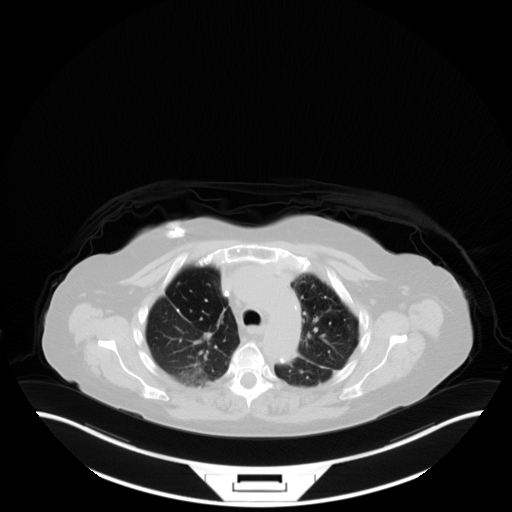
[im 198/198  brain]
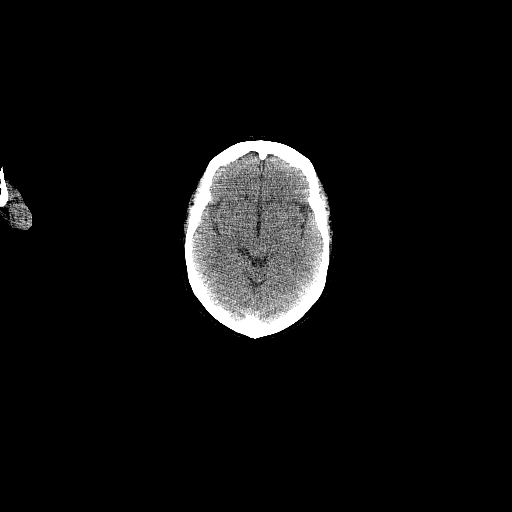

[Series 6: ct sk_thigh 5.0 b70f lung_bone · axial · 5.0mm · 0.58mm/px · 1 of 50 slices shown]
[im 1/50  bone]
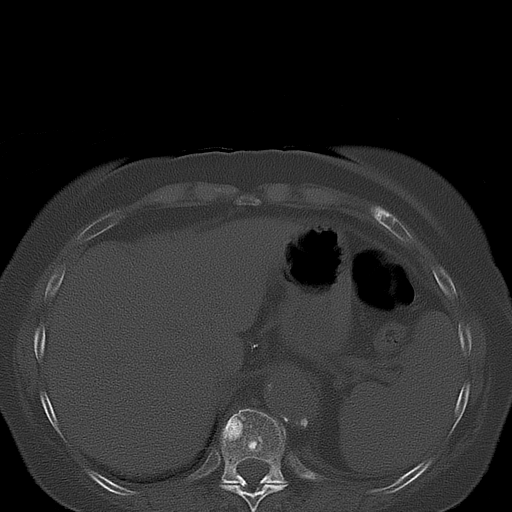

[Series 8: pet sk_thigh nac · axial · 5.0mm · 4.07mm/px · z∈[-728,+68]mm · 5 of 200 slices shown]
[im 1/200]
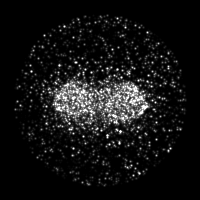
[im 50/200]
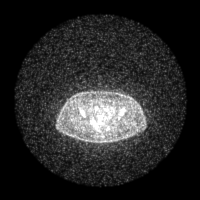
[im 100/200]
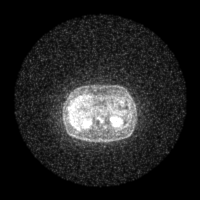
[im 150/200]
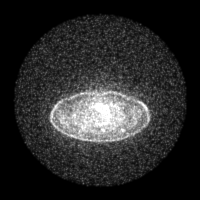
[im 200/200]
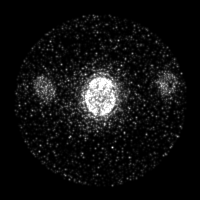

[Series 604: mip collection<mip range> · coronal · 1.68mm/px · 1 of 32 slices shown]
[im 1/32]
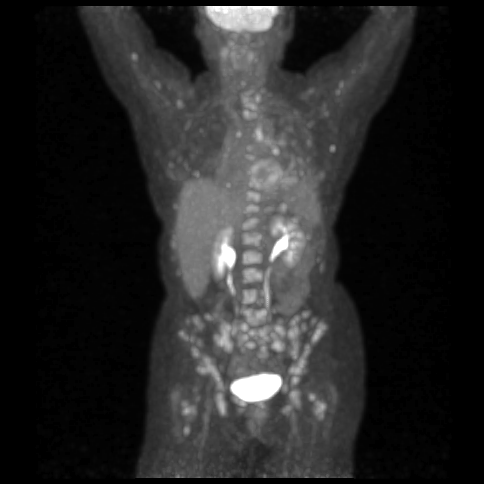

[Series 605: range-ct sk_thigh 5.0 (id)<alpha range> · 2 of 80 slices shown (1 of 2)]
[im 1/80]
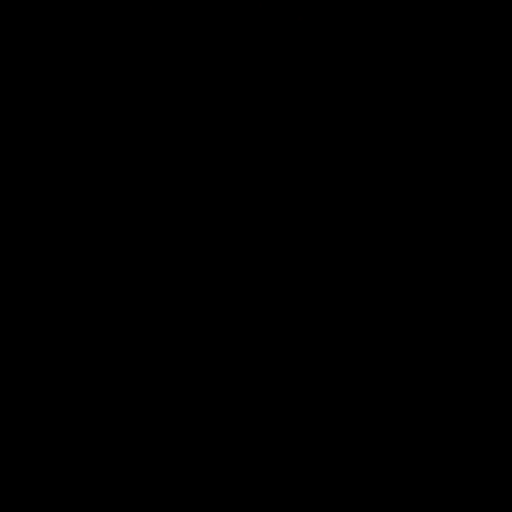
[im 80/80]
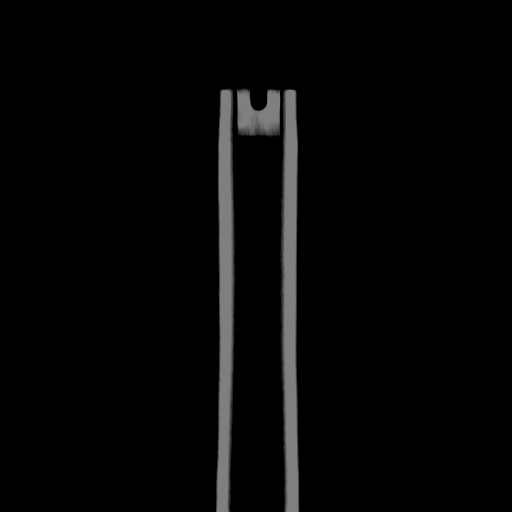

[Series 606: range-ct sk_thigh 5.0 (id)<alpha range> · 5 of 189 slices shown (2 of 2)]
[im 1/189]
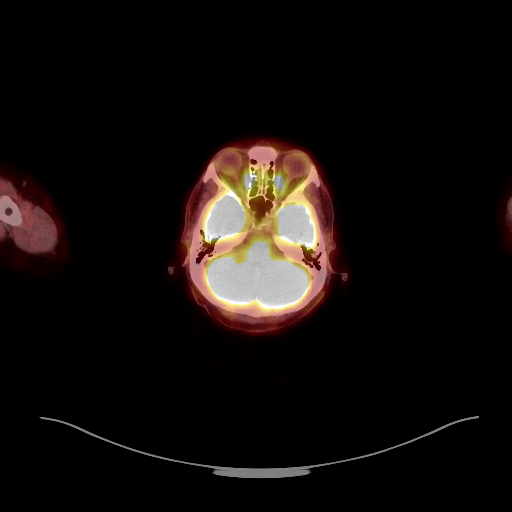
[im 48/189]
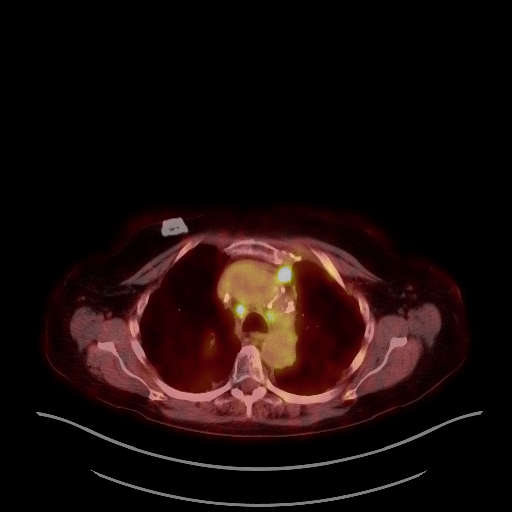
[im 95/189]
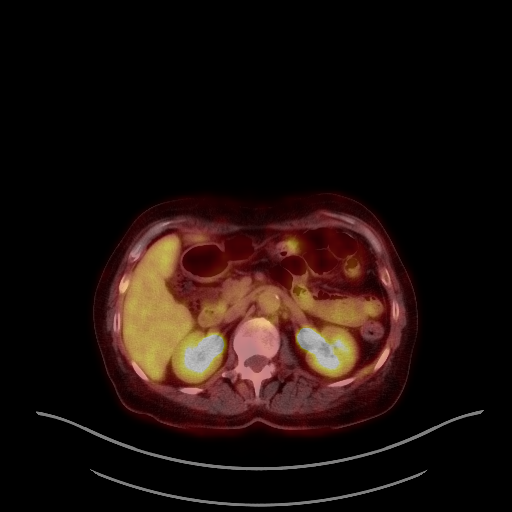
[im 142/189]
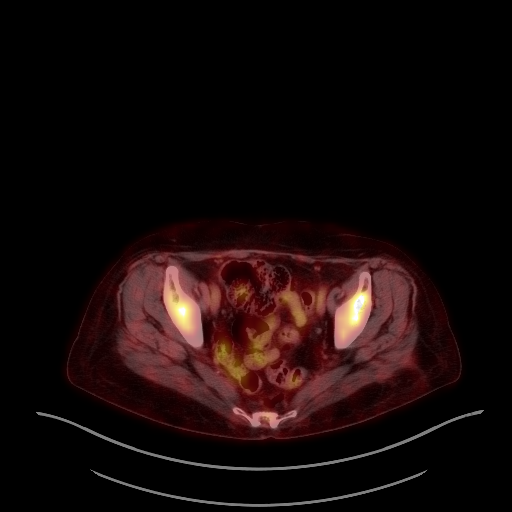
[im 189/189]
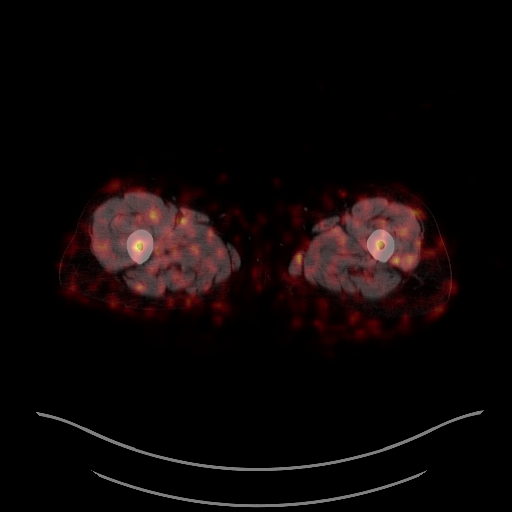

[Series 1596: results mm oncology reading · 1.59mm/px · 1 of 9 slices shown]
[im 1/9]
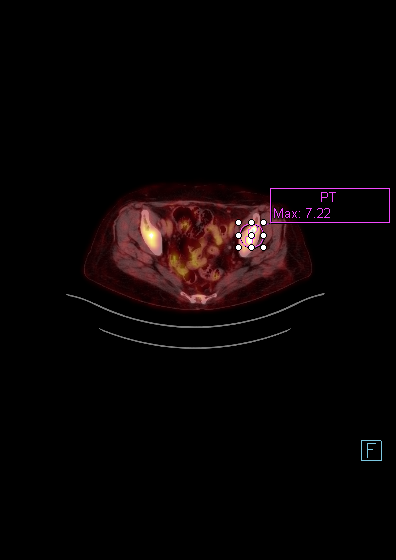

[25 of 25 positions shown; findings below may reference images not displayed]

FINDINGS: NECK

The index right cervical lymph node has an SUV max equal to 5.47. On
the previous exam the SUV max was equal to 6.5.

CHEST

Heart size is normal. No pericardial effusion. Hypermetabolic
mediastinal and hilar lymph node metastasis are identified.
Hypermetabolic left hilar lymph node has an SUV max equal to 8.69.
On the previous exam the SUV max was equal to 4.37. Enlarged and
hypermetabolic pre-vascular lymph node measures 1.4 cm and has an
SUV max equal to 9.9. On the previous exam this lymph node measured
1.3 cm and had an SUV max equal to 5.5. Persistent hypermetabolic
right paratracheal lymph node has an SUV max equal to 6.5. Previous
this measured the same. Hypermetabolic left supraclavicular lymph
node has an SUV max equal to 7.49. Previously 5.8. New
hypermetabolic left axillary node has an SUV max equal to 4.96.

Postoperative change in volume loss within the right hemi thorax
again noted. Index nodule within the right upper lung zone measures
5 mm, image 14 of series 6. Unchanged from previous exam. Areas of
architectural distortion and pleural thickening involving both lower
lung zones again noted and appears similar to the previous exam.
Within the left lower lobe there is a posterior medial soft tissue
attenuating mass which measures 3.7 cm and has an SUV max equal to
5.5. Previously 3.3 cm with an SUV max equal to 4.4.

ABDOMEN/PELVIS

No abnormal hypermetabolic activity within the liver, pancreas,
adrenal glands, or spleen. Aortic atherosclerosis. No hypermetabolic
lymph nodes in the abdomen or pelvis.

SKELETON

Interval progression of hypermetabolic bone metastasis. Lesions are
increased in number and degree of FDG uptake. Hypermetabolic tumor
within the L5 vertebra has an SUV max equal to 7.58. Previously
5.02. Hypermetabolic tumor within the left iliac wing has an SUV max
equal to 7.2. On the previous exam [DATE]. Hypermetabolic tumor within
the posterior left acetabulum has an SUV max equal to 7.3.
Previously 3.26.
IMPRESSION: 1. Overall there has been an interval progression of multifocal
hypermetabolic bone metastases.
2. Interval progression of metastatic adenopathy within the left
hilum and left side of mediastinum.
3. Soft tissue attenuating mass in the left lower lobe in the area
of architectural distortion and scarring is again noted. This
appears slightly increased in size and degree of FDG uptake when
compared with the previous exam.

## 2017-04-20 IMAGING — MR MR CERVICAL SPINE WO/W CM
4 of 8 series · 24 of 48 positions shown · IV contrast (multihance)
Comparison: PET-CT 05/08/2015.

CLINICAL DATA: Neck, left shoulder, scapula, and left arm pain for
7 months. History of metastatic lung cancer.

EXAM:
MRI CERVICAL SPINE WITHOUT AND WITH CONTRAST
TECHNIQUE: Multiplanar and multiecho pulse sequences of the cervical spine, to
include the craniocervical junction and cervicothoracic junction,
were obtained according to standard protocol without and with
intravenous contrast.
CONTRAST:  10 mL MultiHance

[Series 2: T1 · sagittal · 3.0mm · 0.41mm/px · 4 of 15 slices shown]
[im 1/15]
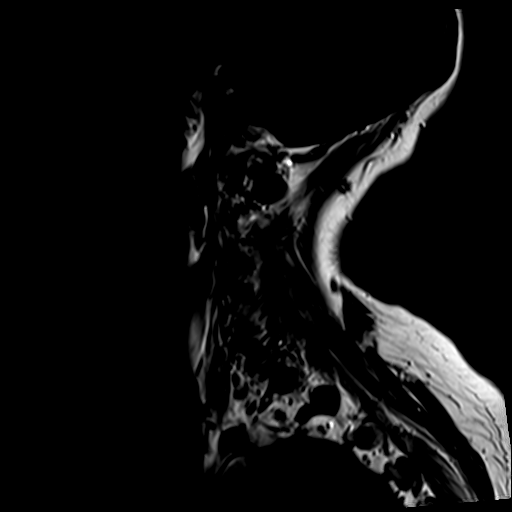
[im 5/15]
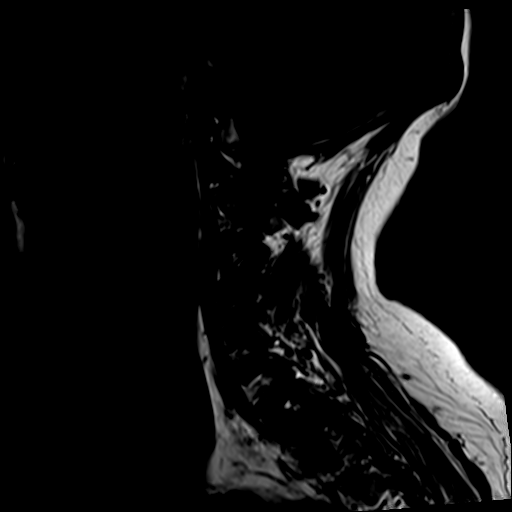
[im 10/15]
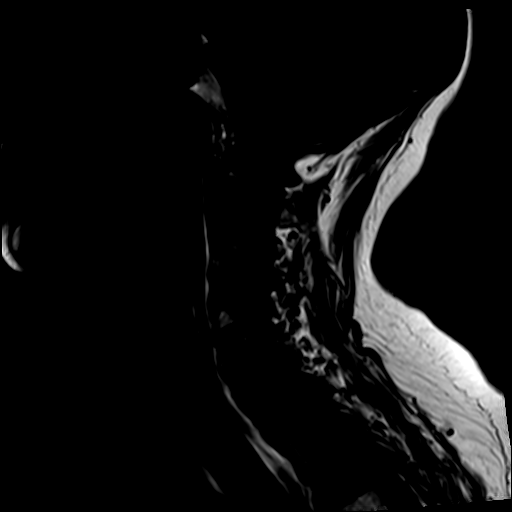
[im 15/15]
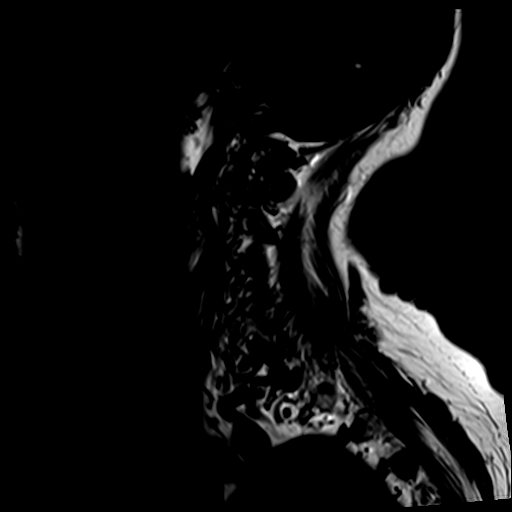

[Series 4: T2 · axial · 3.0mm · 0.39mm/px · z∈[-57,+44]mm · 8 of 30 slices shown (1 of 2)]
[im 1/30]
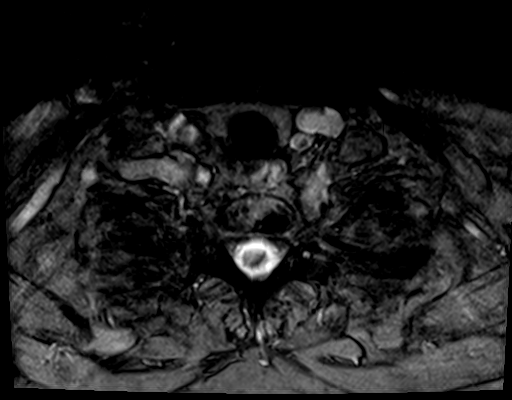
[im 5/30]
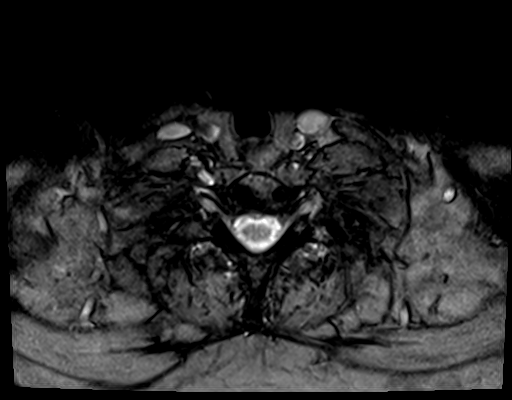
[im 9/30]
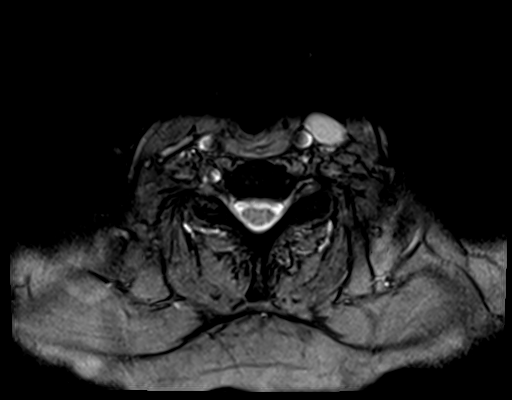
[im 13/30]
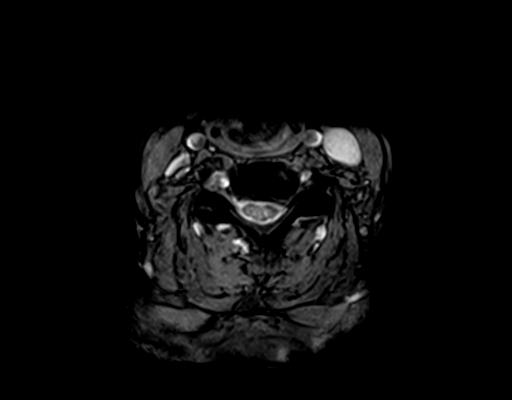
[im 17/30]
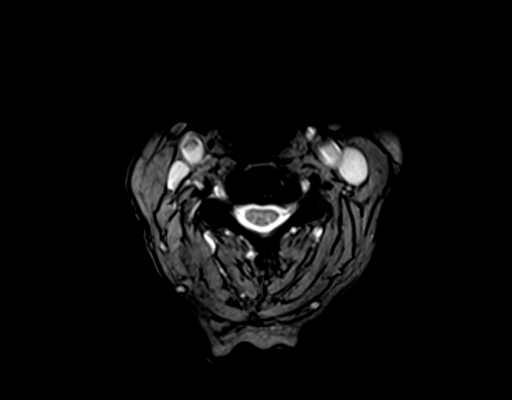
[im 21/30]
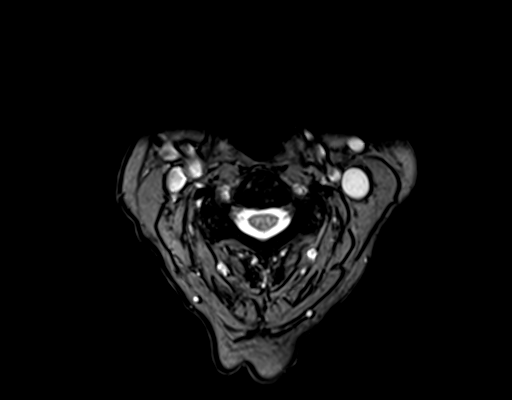
[im 25/30]
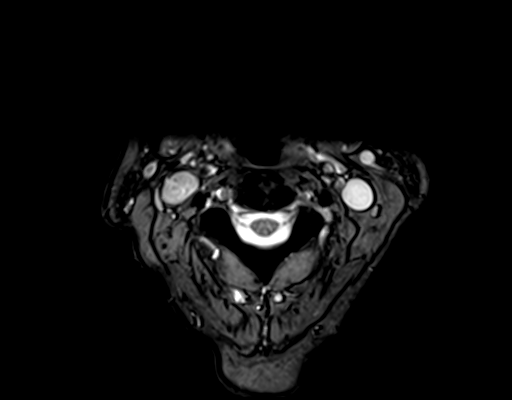
[im 30/30]
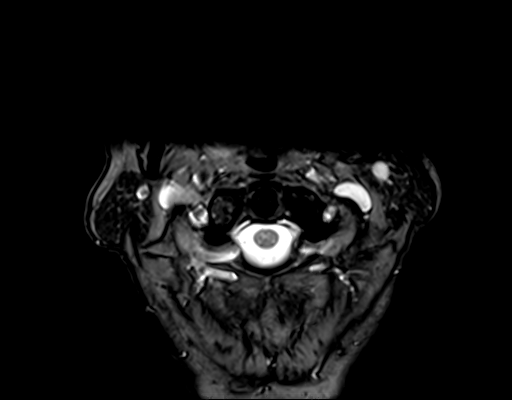

[Series 5: T2 · axial · 3.0mm · 0.62mm/px · z∈[-60,+41]mm · 8 of 30 slices shown (2 of 2)]
[im 1/30]
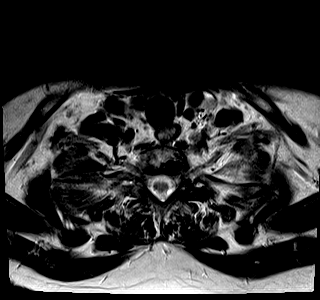
[im 5/30]
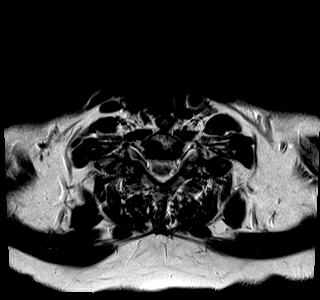
[im 9/30]
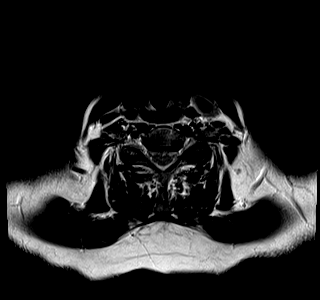
[im 13/30]
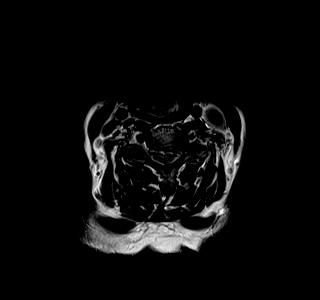
[im 17/30]
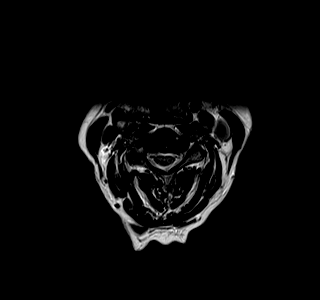
[im 21/30]
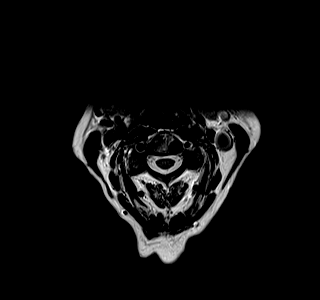
[im 25/30]
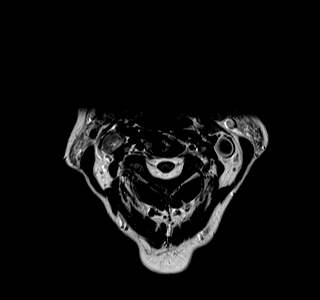
[im 30/30]
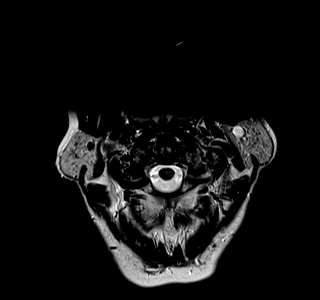

[Series 7: T2 post-contrast · sagittal · 3.0mm · 0.41mm/px · 4 of 15 slices shown]
[im 1/15]
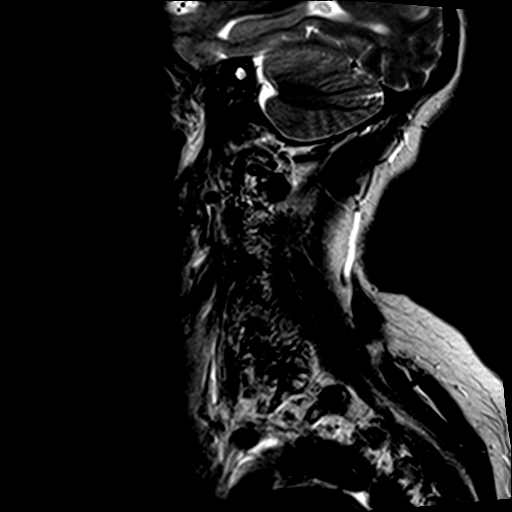
[im 5/15]
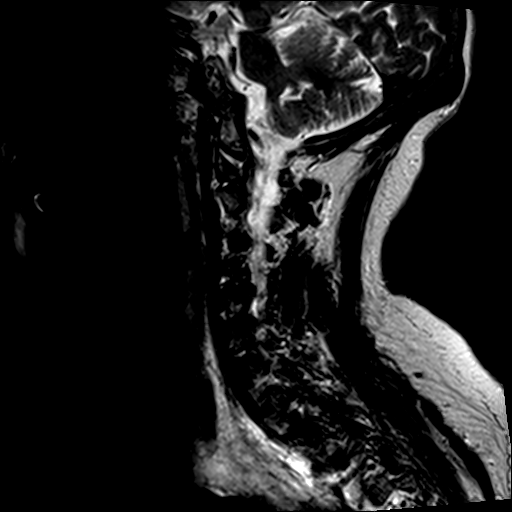
[im 10/15]
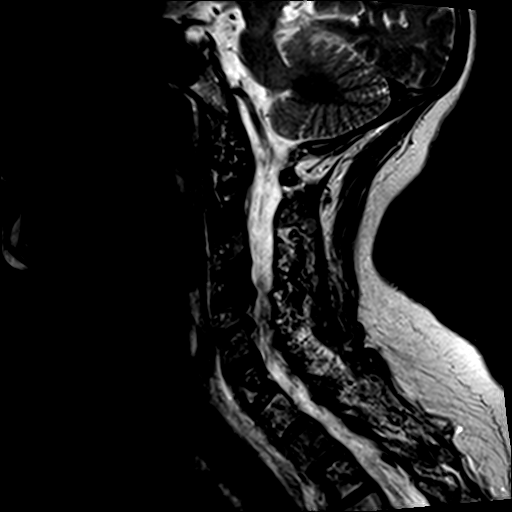
[im 15/15]
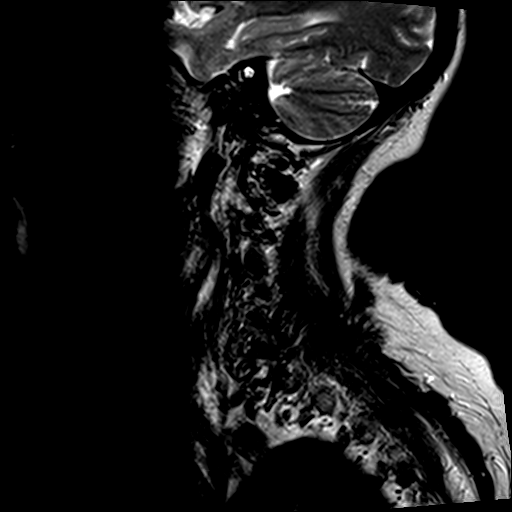

[24 of 48 positions shown; findings below may reference images not displayed]

Brain MRI 11/18/2014. Soft tissue
neck CT 11/14/2007. Cervical spine MRI 02/27/2003.
FINDINGS: There is unchanged straightening of the cervical lordosis with
slight retrolisthesis of C4 on C5. Vertebral body heights are
preserved. As seen on recent PET-CT there are extensive sclerotic
metastases throughout the cervical and visualized upper thoracic
spine. The C4 vertebral body is essentially completely involved.
There is involvement of the right greater than left lateral masses
and posterior arch of C1. All cervical vertebral bodies are
involved, and most levels demonstrate posterior element involvement.
The visualized portion of the upper thoracic spine demonstrates
vertebral body and posterior element involvement from T1-T3. There
are also lesions involving the skullbase including the clivus which
are new from the prior brain MRI. No definite or epidural tumor is
identified in the cervical spine.

Multilevel cervical disc space narrowing is present, moderate at
C4-5 and C5-6. The cervical spinal cord is normal in caliber and
signal. A 1.4 cm short axis right level II lymph node is similar in
size to the prior PET-CT.

C2-3: Slight right facet arthrosis. No disc herniation or stenosis.

C3-4: Broad-based posterior disc osteophyte complex has progressed
from the 7886 MRI but does not result in significant stenosis.

C4-5: Broad-based posterior disc osteophyte complex has progressed
and results in moderate bilateral neural foraminal stenosis and
borderline spinal stenosis.

C5-6: Broad-based posterior disc osteophyte complex eccentric to the
left has mildly progressed and results in moderate left neural
foraminal stenosis and borderline spinal stenosis.

C6-7: Broad central disc protrusion appears slightly larger than on
the prior MRI without spinal stenosis. There is minimal left neural
foraminal narrowing.

C7-T1:  New tiny central disc protrusion without stenosis.
IMPRESSION: 1. Widespread osseous metastases throughout the cervical and
visualized upper thoracic spine as well as skullbase. No epidural
tumor or spinal cord compression.
2. Progressive cervical disc degeneration from 7886 with moderate
neural foraminal stenosis bilaterally at C4-5 and on the left at
C5-6.

## 2017-05-11 IMAGING — CT CT ABD-PELV W/ CM
2 of 5 series · 15 of 46 positions shown, 17 images · IV contrast (ISOVUE)
Comparison: Chest CT January 11, 2015 and PET-CT May 08, 2014

CLINICAL DATA: Abdominal pain. The patient is being treated for
lung cancer.

EXAM:
CT ABDOMEN AND PELVIS WITH CONTRAST
TECHNIQUE: Multidetector CT imaging of the abdomen and pelvis was performed
using the standard protocol following bolus administration of
intravenous contrast.
CONTRAST:  100mL [id number] IOPAMIDOL ([id number]) INJECTION 61%

[Series 2: abd/pel with · axial · 0.66mm/px · z∈[-423,-38]mm · 12 of 89 slices shown, 14 images]
[im 6/89  soft-tissue]
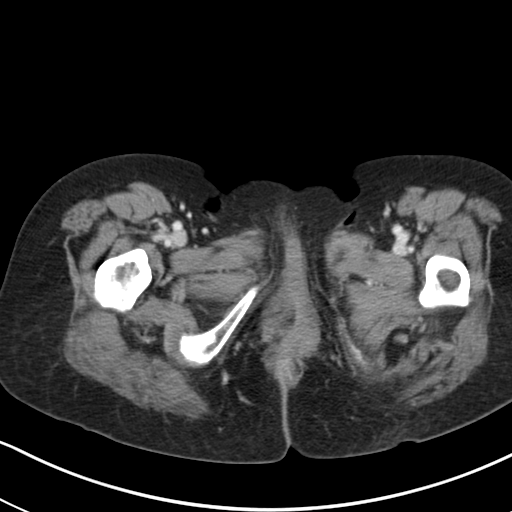
[im 6/89  bone]
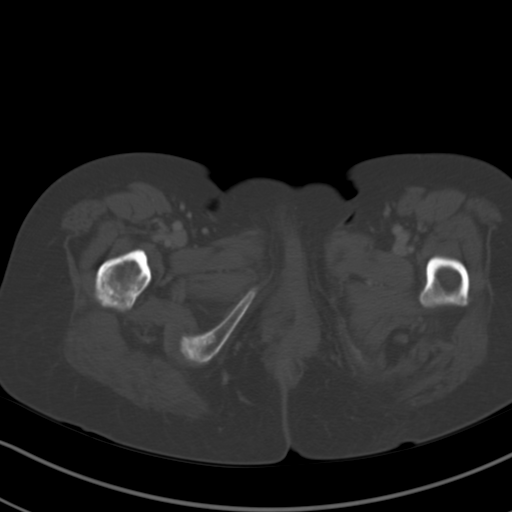
[im 12/89  soft-tissue]
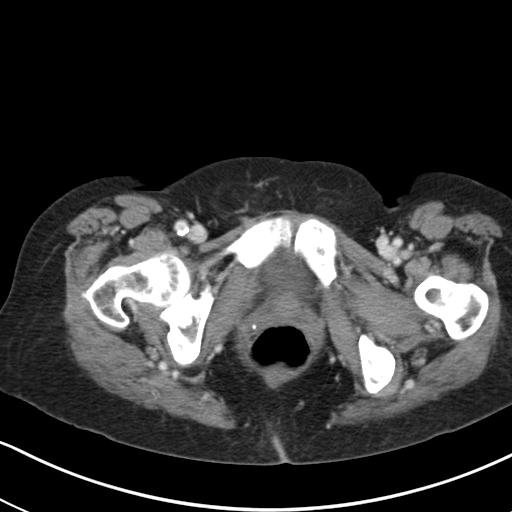
[im 18/89  soft-tissue]
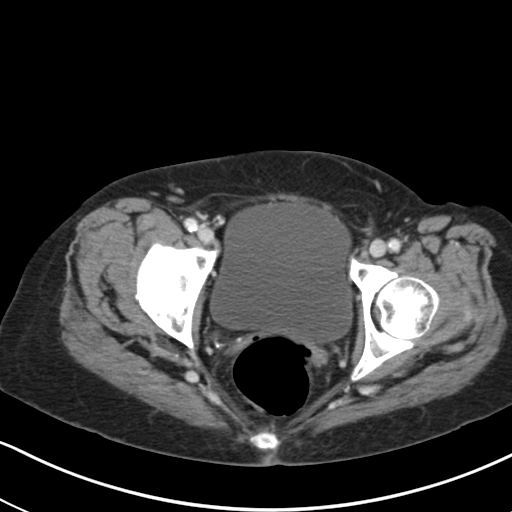
[im 30/89  soft-tissue]
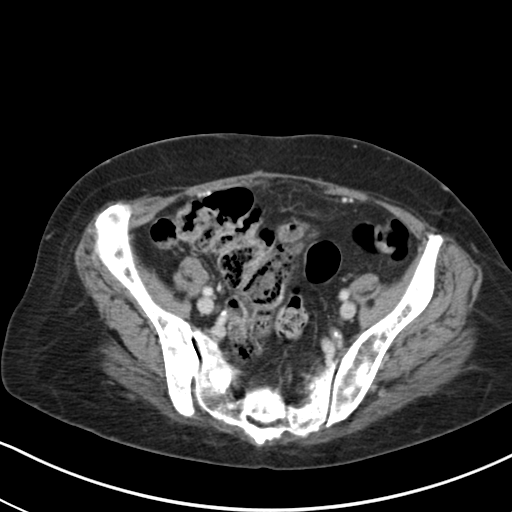
[im 36/89  soft-tissue]
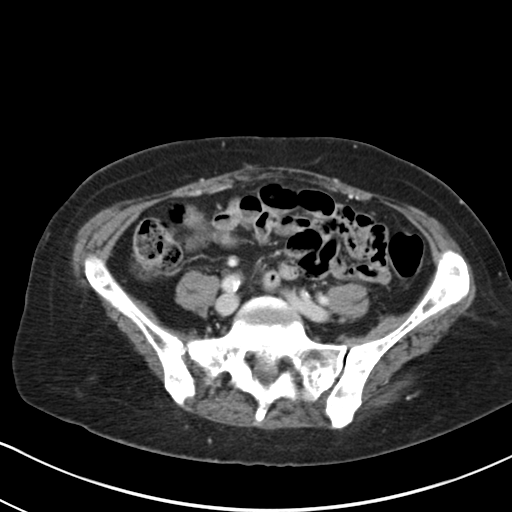
[im 42/89  soft-tissue]
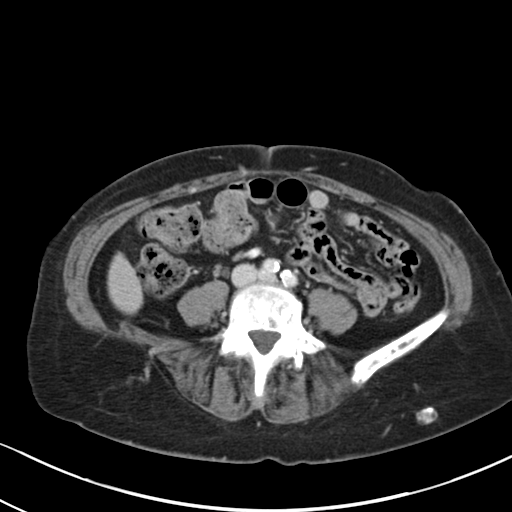
[im 47/89  soft-tissue]
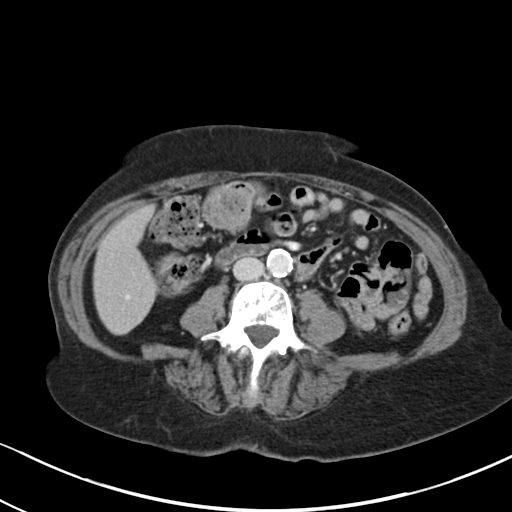
[im 53/89  soft-tissue]
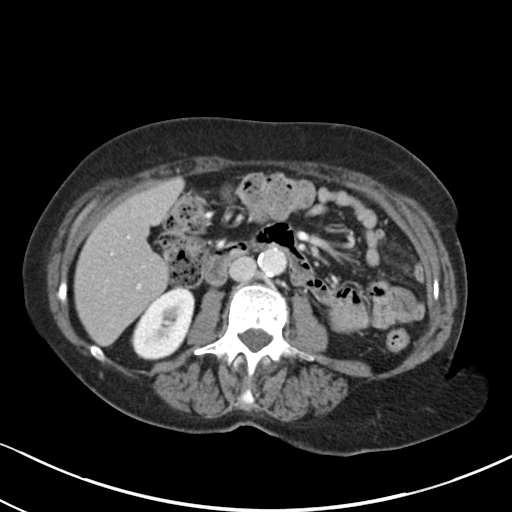
[im 59/89  soft-tissue]
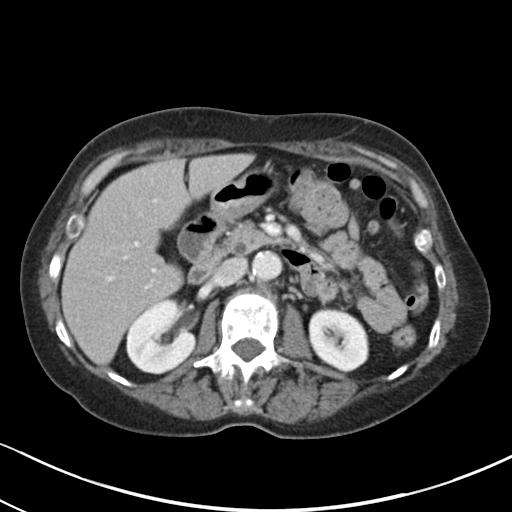
[im 59/89  bone]
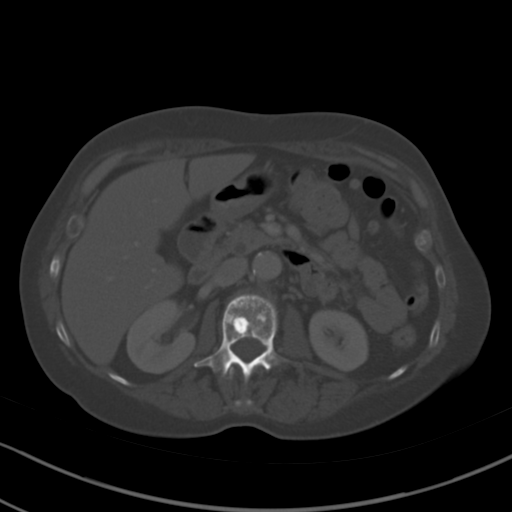
[im 71/89  soft-tissue]
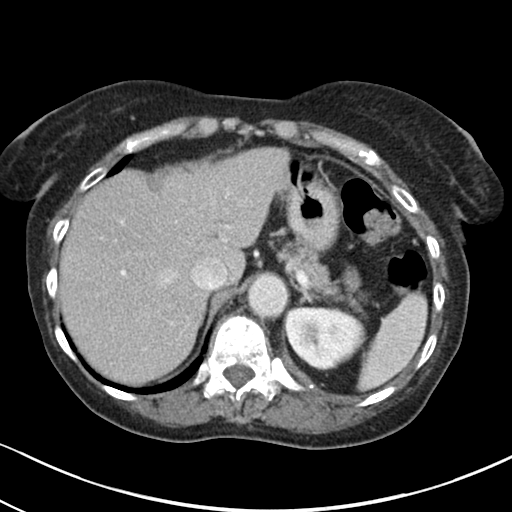
[im 77/89  soft-tissue]
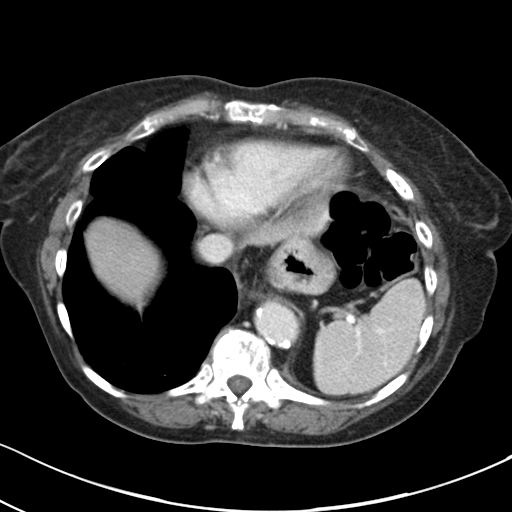
[im 83/89  soft-tissue]
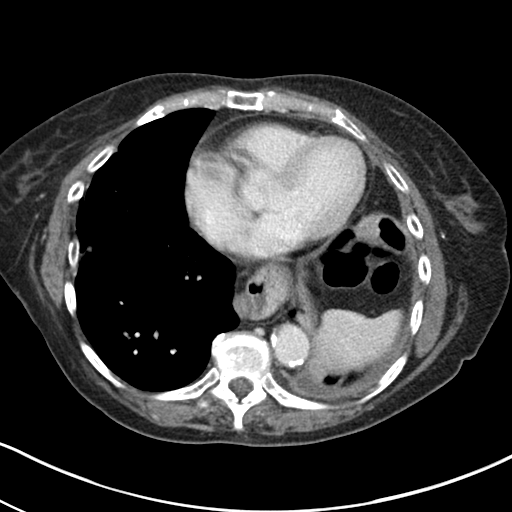

[Series 6: coronal a/|p · coronal · 0.70mm/px · 3 of 147 slices shown]
[im 49/147  soft-tissue]
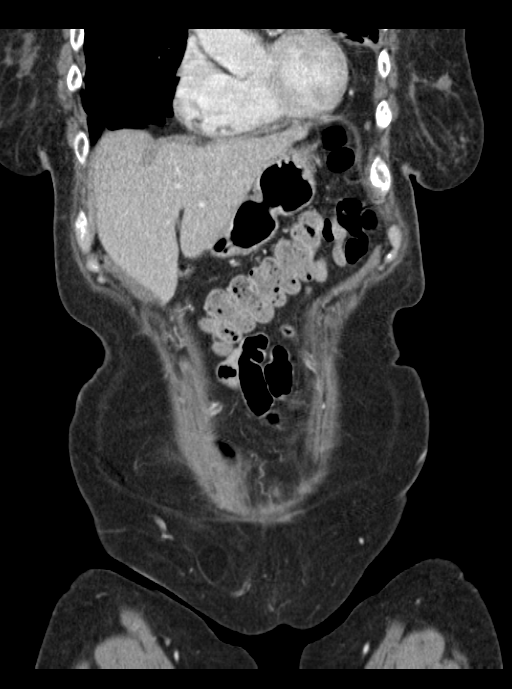
[im 65/147  soft-tissue]
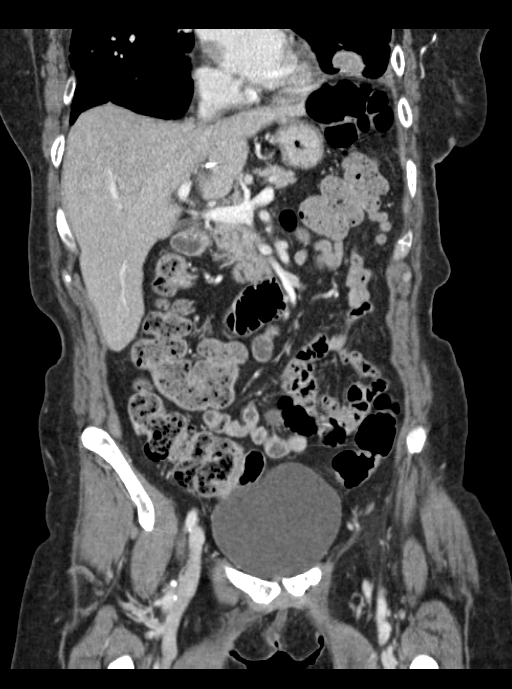
[im 82/147  soft-tissue]
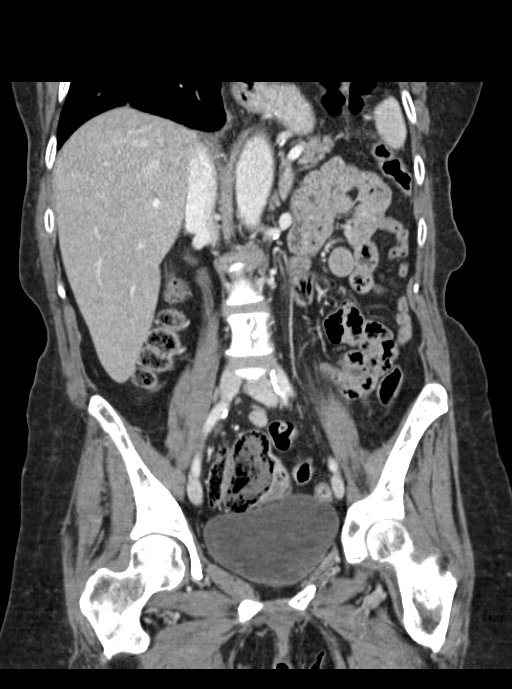

[15 of 46 positions shown; findings below may reference images not displayed]

FINDINGS: Chronic changes consistent with the patient's history are seen in
the lingula, medial left lower lobe with masslike opacities. There
is also right infrahilar opacity and a peripheral nodule on series
4, images 2 and 5. These findings are also stable. There is volume
loss on the left. There is a hiatal hernia. No other abnormalities
are seen within the chest.

No free air or free fluid. There is elevation of the left
hemidiaphragm. The patient is status post cholecystectomy. There is
resulting prominence of the common bile duct which is within normal
limits. The liver all, spleen, adrenal glands, pancreas, and kidneys
are normal. Atherosclerotic changes are seen in the non aneurysmal
aorta. No adenopathy is seen within the abdomen. Other than the
hiatal hernia, the stomach and small bowel are normal. The colon
demonstrates fecal loading but no evidence of colitis or
inflammation. Fecal loading is most prominent in the cecum which
extends into the right side of the pelvis. The patient is status
post appendectomy.

The pelvis demonstrates a mildly prominent cecum filled with stool.
No adenopathy. The patient is status post hysterectomy. The bladder
is normal.

Widespread bony metastatic disease is seen throughout all visualized
bones.

No filling defects in the upper renal collecting system.
IMPRESSION: 1. Chronic changes consistent with the history of lung cancer in the
lung bases.
2. Moderate fecal loading throughout the colon, particularly in the
cecum which extends into the pelvis.
3. No acute abnormality in the abdomen.
4. Widespread bony metastatic disease.

## 2017-06-03 IMAGING — PT NM PET TUM IMG RESTAG (PS) SKULL BASE T - THIGH
9 series · 25 of 25 positions shown · non-contrast
Comparison: PET-CT 05/08/2015.

CLINICAL DATA: Subsequent treatment strategy for metastatic lung
cancer. On chemotherapy.

EXAM:
NUCLEAR MEDICINE PET SKULL BASE TO THIGH
TECHNIQUE: 6.21 mCi F-18 FDG was injected intravenously. Full-ring PET imaging
was performed from the skull base to thigh after the radiotracer. CT
data was obtained and used for attenuation correction and anatomic
localization.
FASTING BLOOD GLUCOSE:  Value: 89 mg/dl

[Series 3: pet sk_thigh ac · axial · 5.0mm · 4.07mm/px · z∈[-999,-227]mm · 4 of 194 slices shown]
[im 1/194]
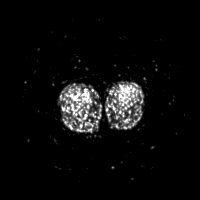
[im 65/194]
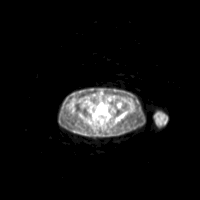
[im 129/194]
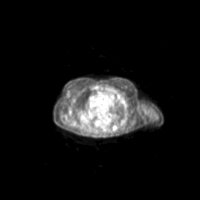
[im 194/194]
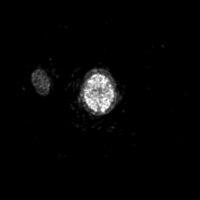

[Series 4: ct sk_thigh 5.0 b31f · axial · 5.0mm · 0.98mm/px · z∈[-999,-227]mm · 4 of 193 slices shown]
[im 1/193]
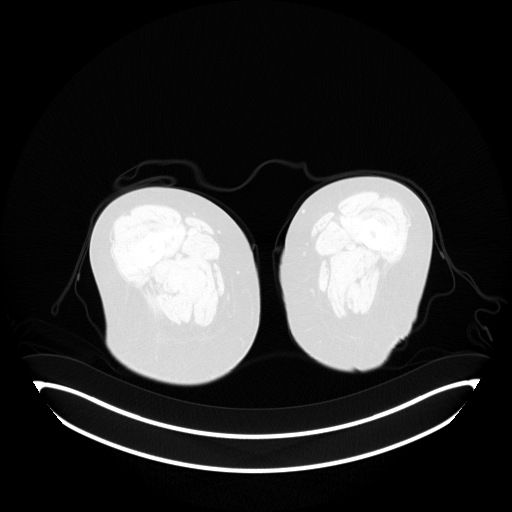
[im 65/193]
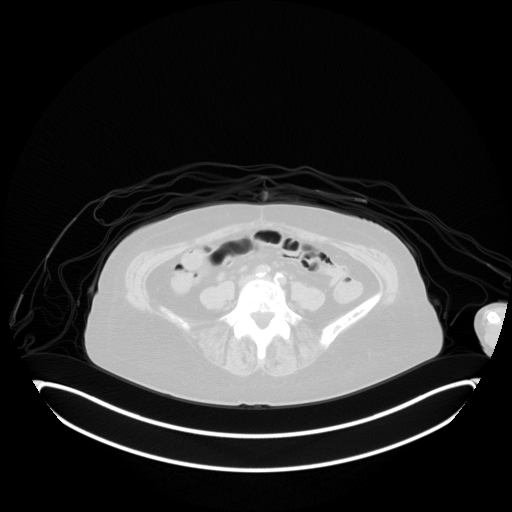
[im 129/193]
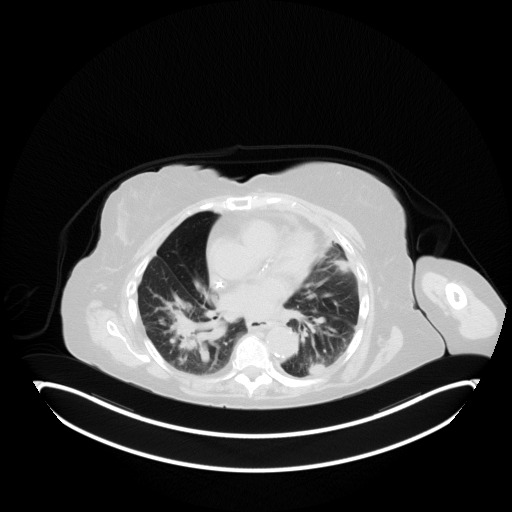
[im 193/193  brain]
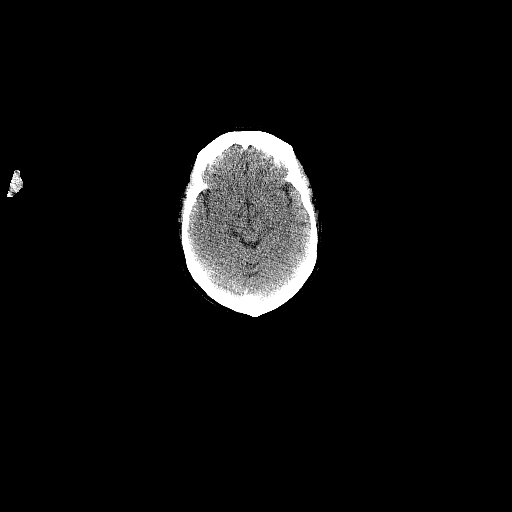

[Series 7: pet sk_thigh nac · axial · 5.0mm · 4.07mm/px · z∈[-999,-227]mm · 5 of 194 slices shown]
[im 1/194]
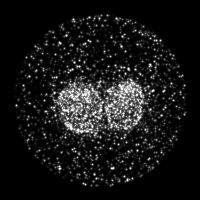
[im 49/194]
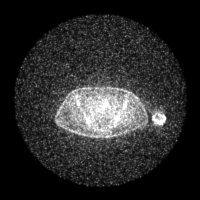
[im 97/194]
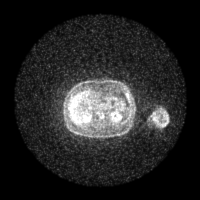
[im 145/194]
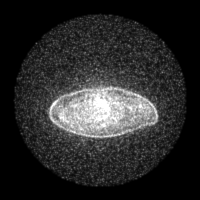
[im 194/194]
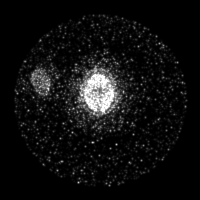

[Series 8: ct sk_thigh 5.0 b70f (id)_bone · axial · 5.0mm · 0.59mm/px · z∈[-607,-355]mm · 2 of 64 slices shown]
[im 1/64  bone]
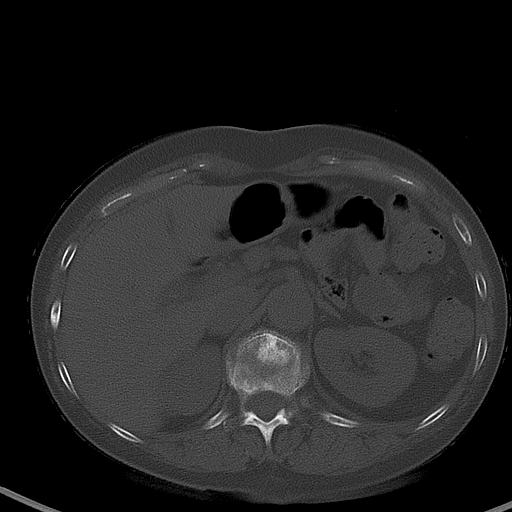
[im 64/64  bone]
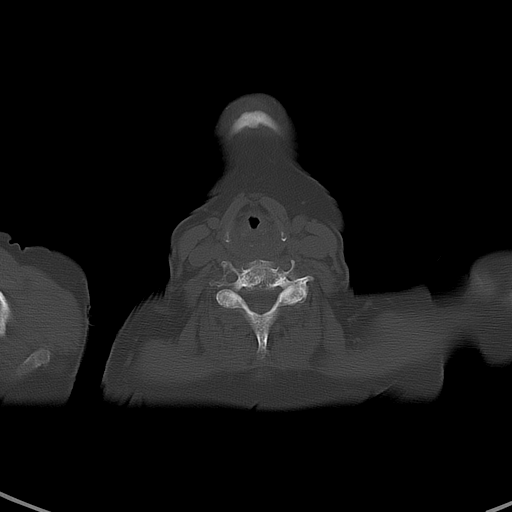

[Series 603: range-ct sk_thigh 5.0 (id)<alpha range> · 2 of 85 slices shown (1 of 2)]
[im 1/85]
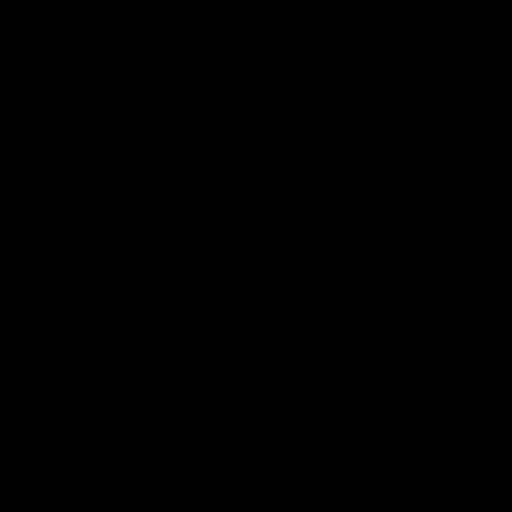
[im 85/85]
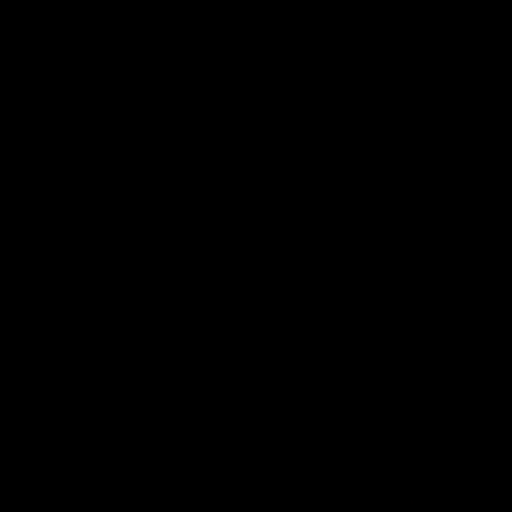

[Series 604: mip collection<mip range> · coronal · 1.68mm/px · 1 of 32 slices shown]
[im 1/32]
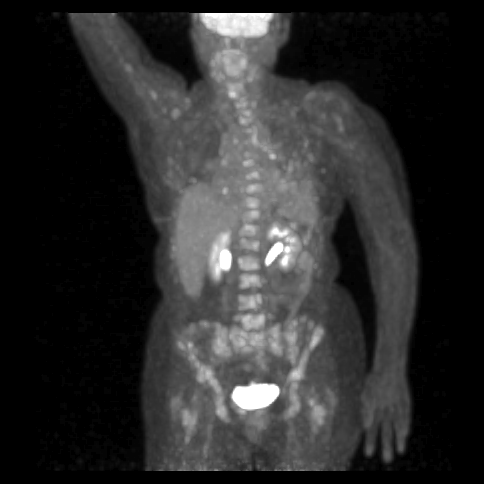

[Series 605: range-ct sk_thigh 5.0 (id)<alpha range> · 5 of 182 slices shown (2 of 2)]
[im 1/182]
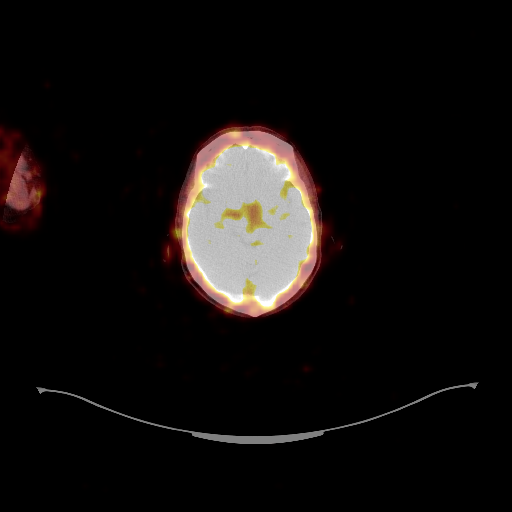
[im 46/182]
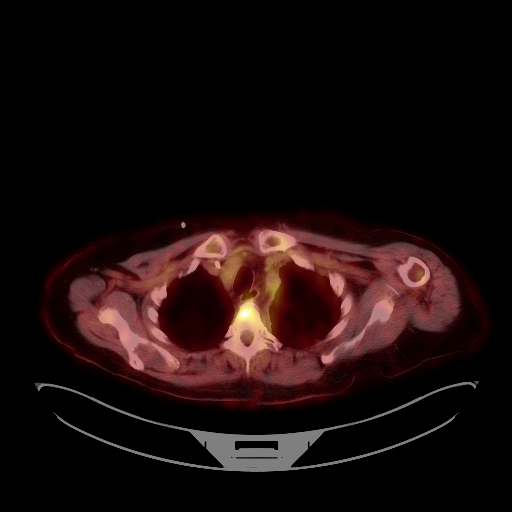
[im 91/182]
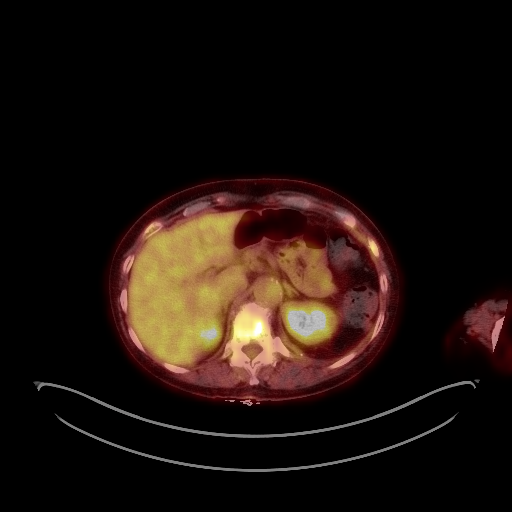
[im 136/182]
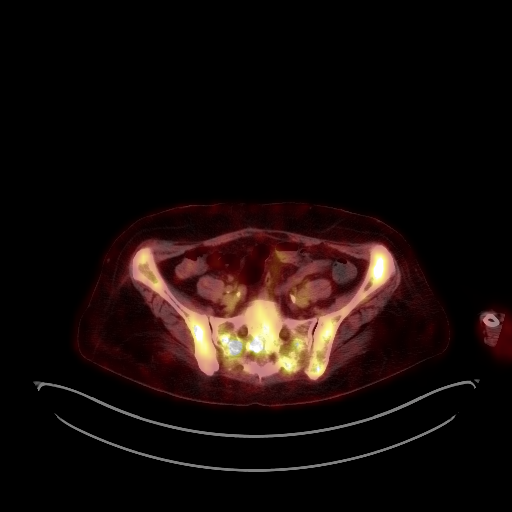
[im 182/182]
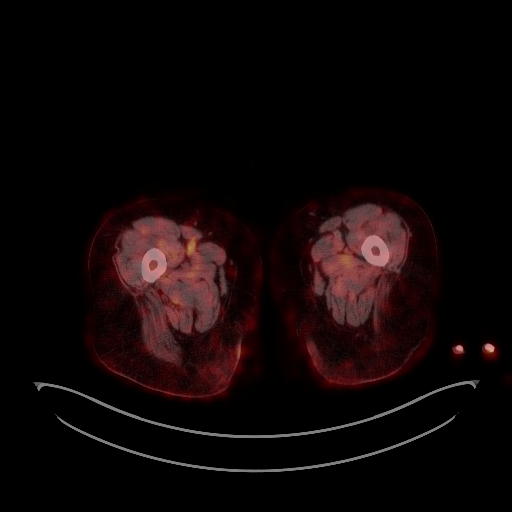

[Series 1456: results mm oncology reading · 1.12mm/px · 1 of 26 slices shown (1 of 2)]
[im 1/26]
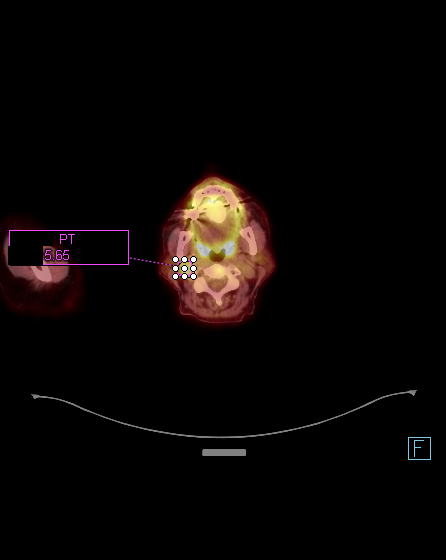

[Series 1560: results mm oncology reading · 1.32mm/px · 1 of 2 slices shown (2 of 2)]
[im 1/2]
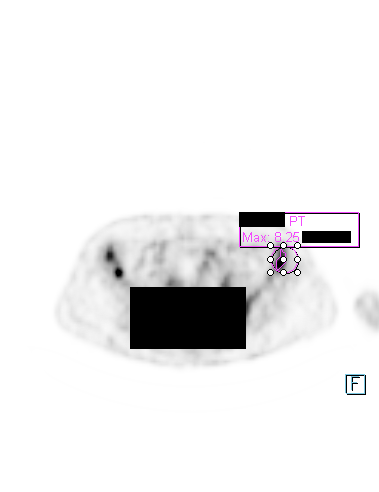

[25 of 25 positions shown; findings below may reference images not displayed]

CT the abdomen and pelvis
06/27/2015. CT of the chest 04/04/2015. Multiple other priors.
FINDINGS: NECK

1 cm right-sided level 2 lymph node (image 21 of series 4) is
hypermetabolic (SUVmax = 5.7), similar to the prior study.

CHEST

There again multiple pulmonary nodules and masses scattered
throughout the lungs bilaterally, the largest of which measure up to
3.6 x 2.4 cm (image 38 of series 8) in the inferior segment of the
lingula, and is hypermetabolic (SUVmax = 3.6). There is also some
pleural soft tissue in the medial aspect of the lower left
hemithorax which measures up to 4.0 x 4.1 cm (image 75 of series 4)
and is also hypermetabolic (SUVmax = 3.8-5.3). Multiple other areas
of septal thickening and nodularity are noted throughout the lungs
bilaterally, with varying degrees of hypermetabolism, compatible
with additional areas of metastatic disease, some of which appears
to represent developing lymphangitic spread. Left hilar
hypermetabolism (SUVmax = 7.8), and subcarinal hypermetabolism
(SUVmax = 4.8), similar to the prior study. 11 mm hypermetabolic
(SUVmax = 6.9) prevascular lymph node (image 54 of series 4),
slightly smaller and less hypermetabolic than the prior study. Right
internal jugular single-lumen porta cath with tip terminating at the
superior cavoatrial junction. Heart size is normal. There is no
significant pericardial fluid, thickening or pericardial
calcification. There is atherosclerosis of the thoracic aorta, the
great vessels of the mediastinum and the coronary arteries,
including calcified atherosclerotic plaque in the left main, left
anterior descending and left circumflex coronary arteries. Moderate
sized hiatal hernia.

ABDOMEN/PELVIS

No abnormal hypermetabolic activity within the liver, pancreas,
adrenal glands, or spleen. No hypermetabolic lymph nodes in the
abdomen or pelvis. Status post cholecystectomy. Atherosclerosis
throughout the abdominal and pelvic vasculature, without definite
aneurysm. No significant volume of ascites. No pneumoperitoneum. No
pathologic dilatation of small bowel or colon. A few scattered
colonic diverticulae are noted, without surrounding inflammatory
changes to suggest an acute diverticulitis at this time.

SKELETON

Widespread mixed lytic and sclerotic lesions are noted throughout
the visualized axial and appendicular skeleton, most of which
demonstrate hypermetabolism. Overall, compared to the prior study,
the extensive metastatic disease appears increased. This is
particularly evident in the anatomic pelvis where there are an
increased number and size of numerous lesions, and increasing
hypermetabolism throughout the pelvis bilaterally. Additionally,
there are multiple new rib lesions bilaterally.
IMPRESSION: 1. Overall, today's study demonstrates progression of disease,
predominantly based upon increased number and size of numerous
hypermetabolic mixed lytic and sclerotic osseous lesions throughout
the axial and appendicular skeleton. Previously noted multifocal
pulmonary and pleural metastases are generally very similar to the
prior study, as above. Lymphatic involvement appears generally
stable, with exception of slight regression of one of the previously
noted prevascular lymph nodes.
2. Additional incidental findings, as above.

## 2017-06-26 IMAGING — DX DG CHEST 2V
2 series · 2 of 2 positions shown · non-contrast
Comparison: PA and lateral chest 04/22/2015. Single view of the
chest 04/04/2015. PET CT scan 07/20/2015.

CLINICAL DATA: Fever since yesterday. History of lung cancer. The
patient started a new chemotherapy regimen 08/07/2015.

EXAM:
CHEST  2 VIEW

[chest pa]
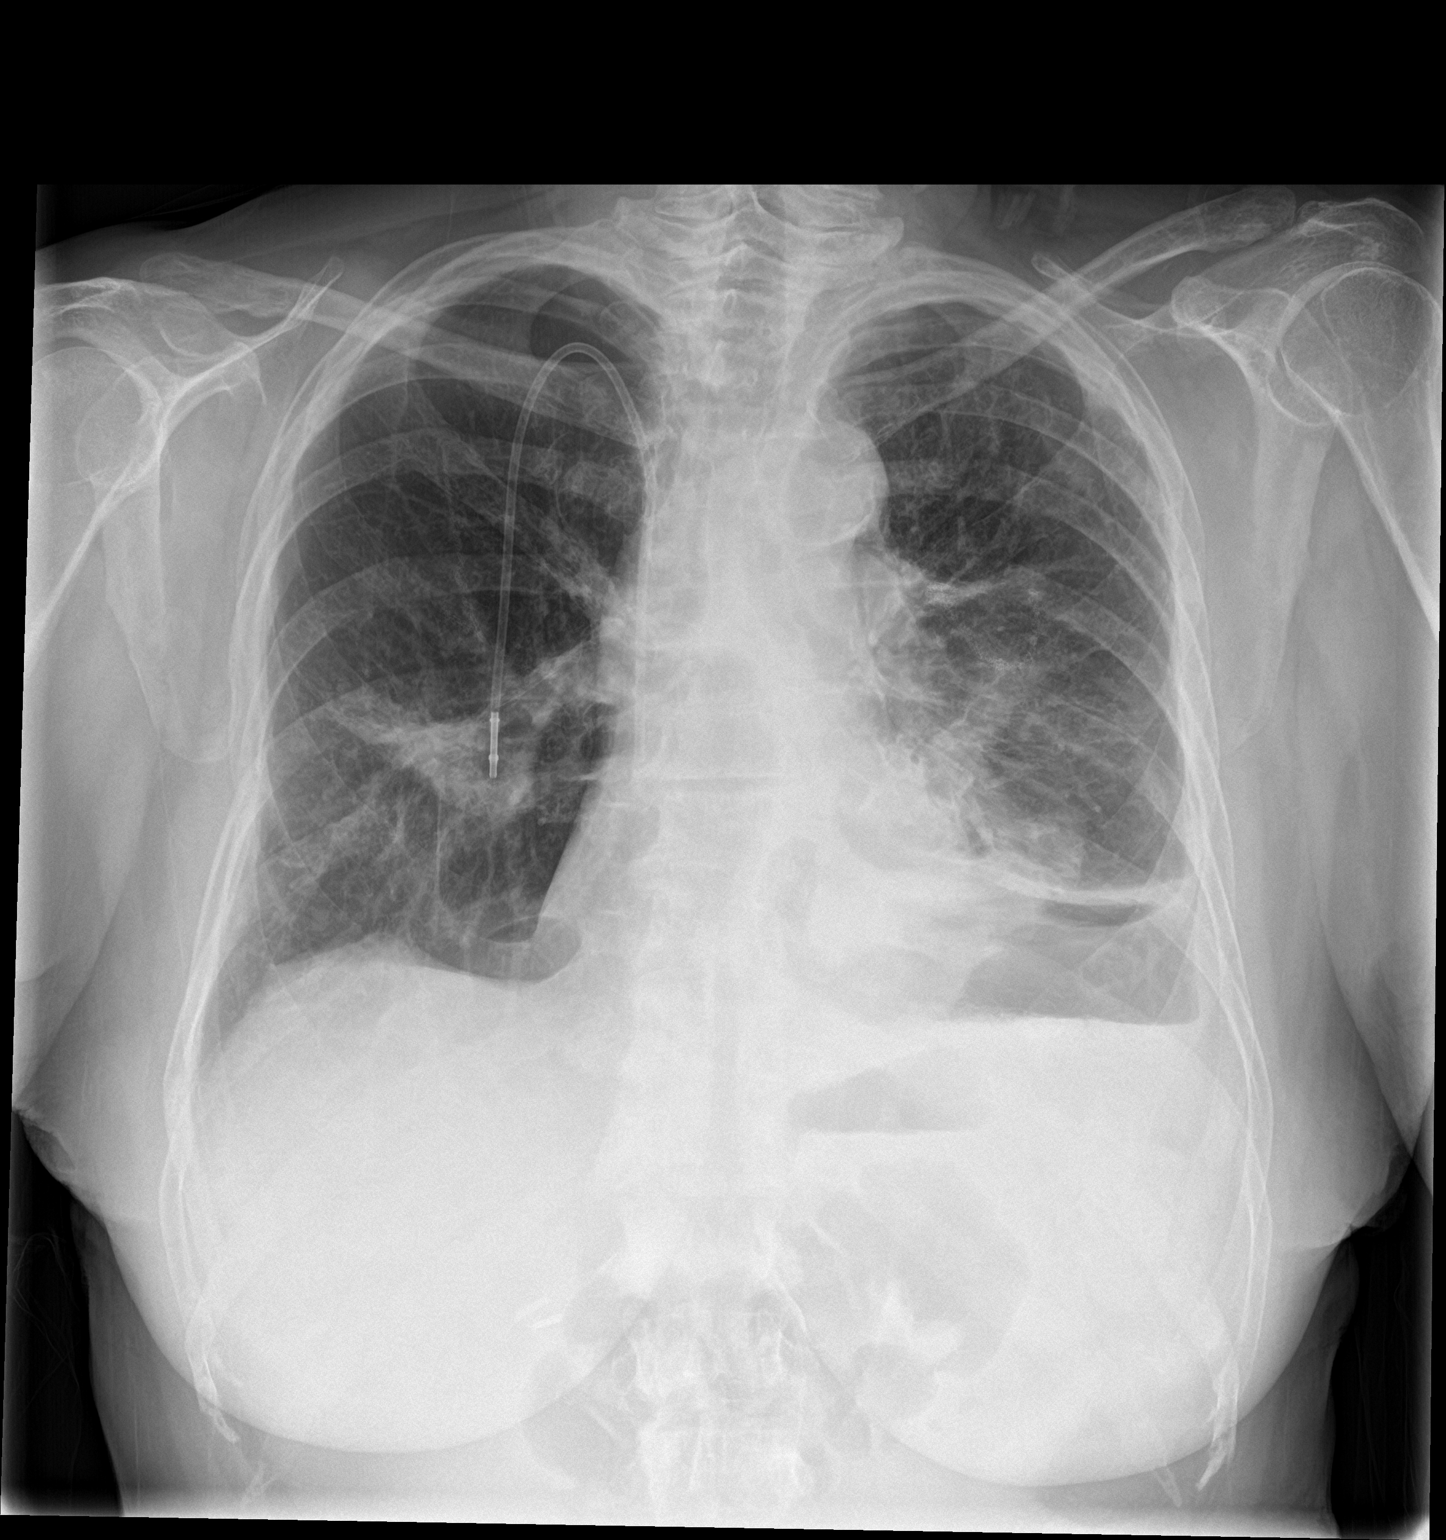

[chest lat]
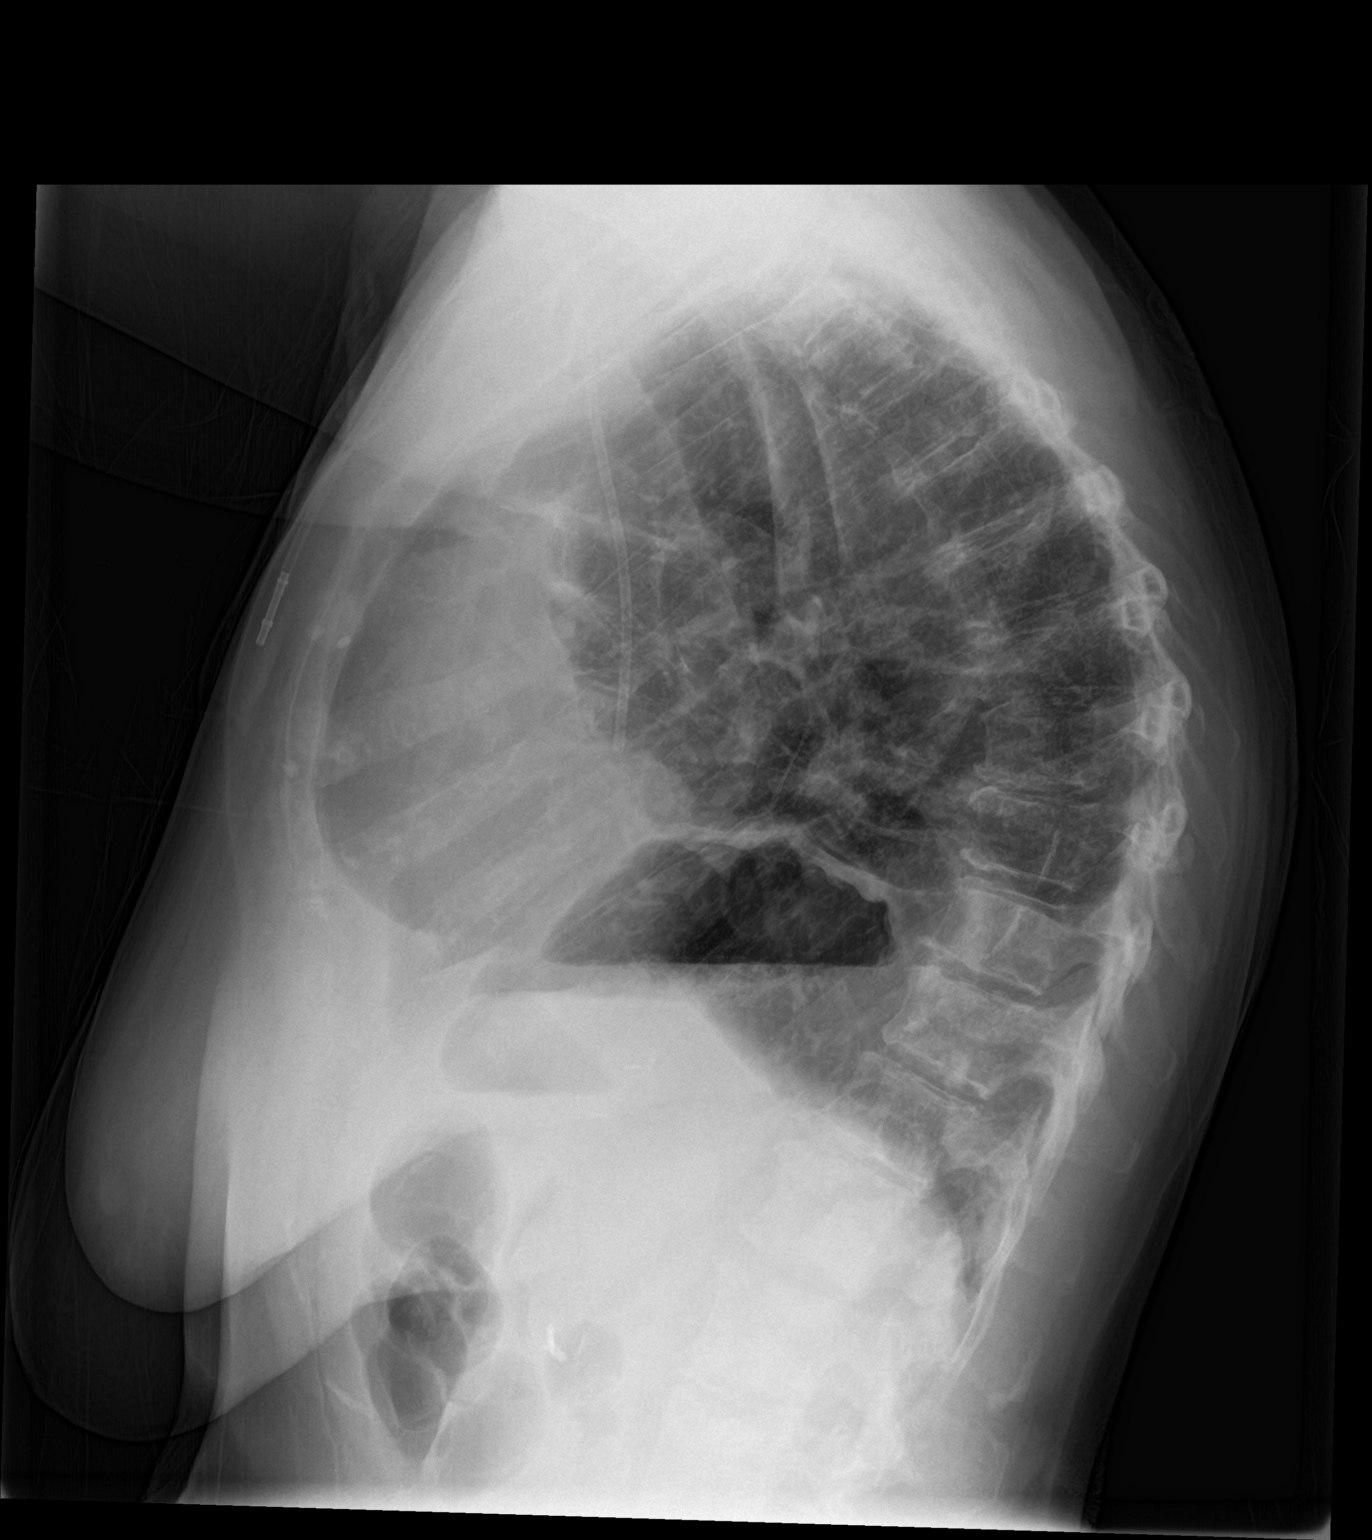

[2 of 2 positions shown; findings below may reference images not displayed]

FINDINGS: Port-A-Cath remains in place. Right lower lobe airspace opacity is
unchanged. Airspace disease in the left lower lobe is also stable in
appearance. No new airspace opacity is identified. The lungs are
emphysematous. Heart size is normal.
IMPRESSION: No acute disease. No change in the appearance of multifocal lung
carcinoma as seen on prior examinations.

## 2017-07-26 IMAGING — PT NM PET TUM IMG RESTAG (PS) SKULL BASE T - THIGH
8 series · 25 of 25 positions shown · non-contrast
Comparison: 07/20/2015

CLINICAL DATA: Subsequent treatment strategy for lung cancer..

EXAM:
NUCLEAR MEDICINE PET SKULL BASE TO THIGH
TECHNIQUE: 6.2 mCi F-18 FDG was injected intravenously. Full-ring PET imaging
was performed from the skull base to thigh after the radiotracer. CT
data was obtained and used for attenuation correction and anatomic
localization.
FASTING BLOOD GLUCOSE:  Value: 82 mg/dl

[Series 3: pet sk_thigh ac · axial · 5.0mm · 4.07mm/px · z∈[-711,+129]mm · 4 of 211 slices shown]
[im 1/211]
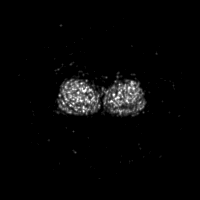
[im 71/211]
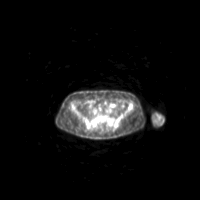
[im 141/211]
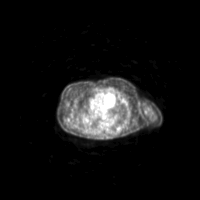
[im 211/211]
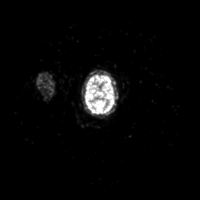

[Series 4: ct sk_thigh 5.0 b31f · axial · 5.0mm · 0.98mm/px · z∈[-711,+129]mm · 5 of 211 slices shown]
[im 1/211]
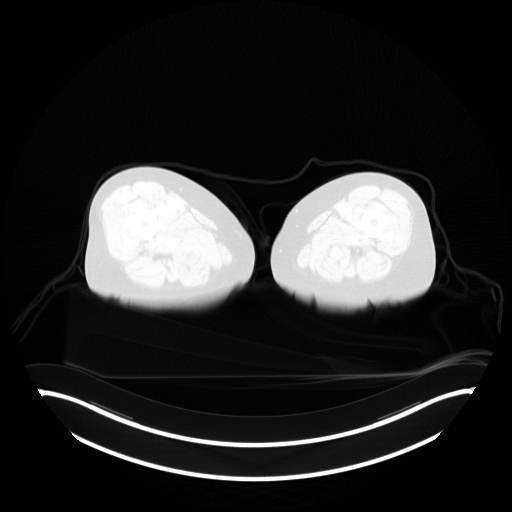
[im 53/211]
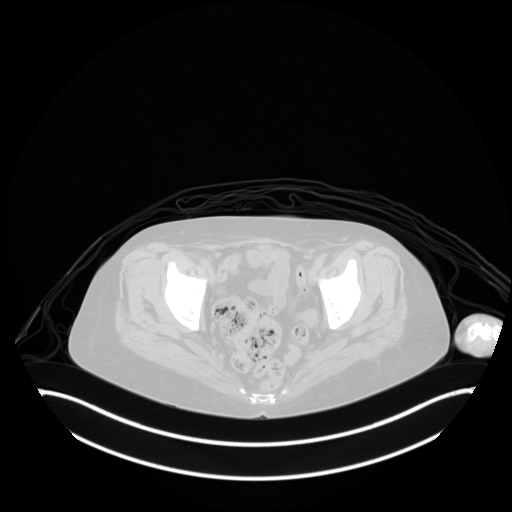
[im 106/211]
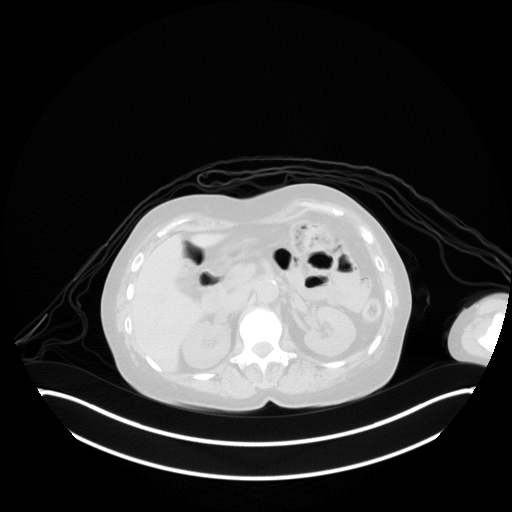
[im 158/211]
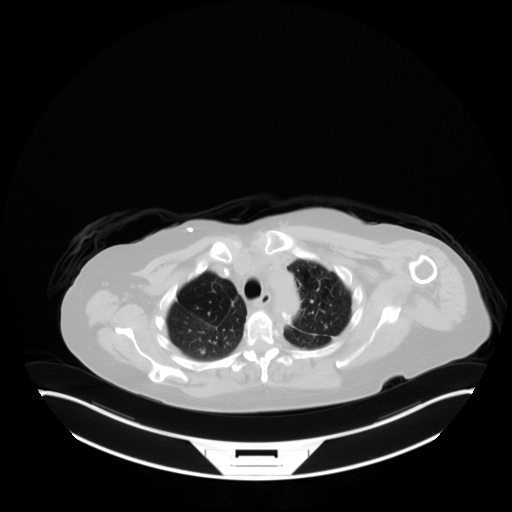
[im 211/211  brain]
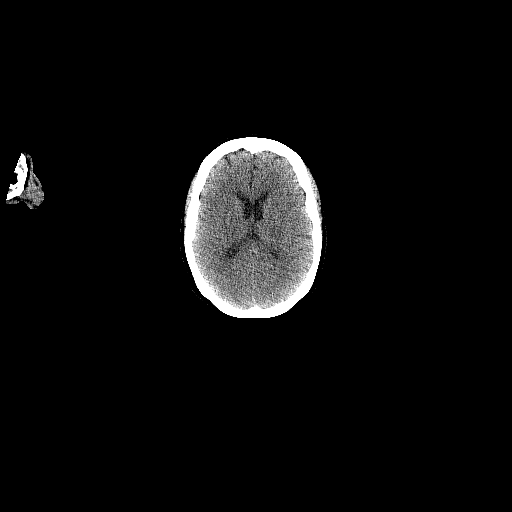

[Series 7: ct sk_thigh 5.0 b70f (id)_bone · axial · 5.0mm · 0.59mm/px · z∈[-287,-3]mm · 2 of 72 slices shown]
[im 1/72  bone]
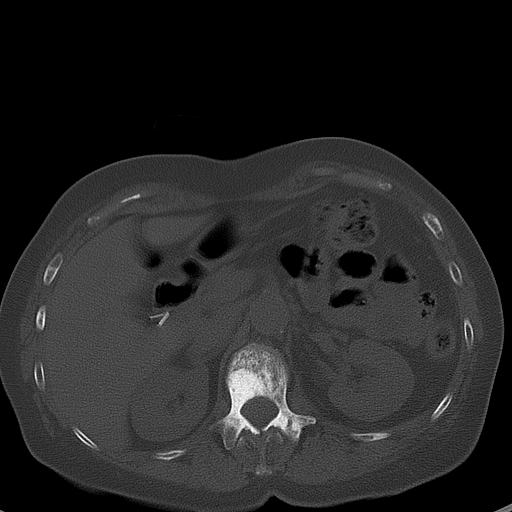
[im 72/72  bone]
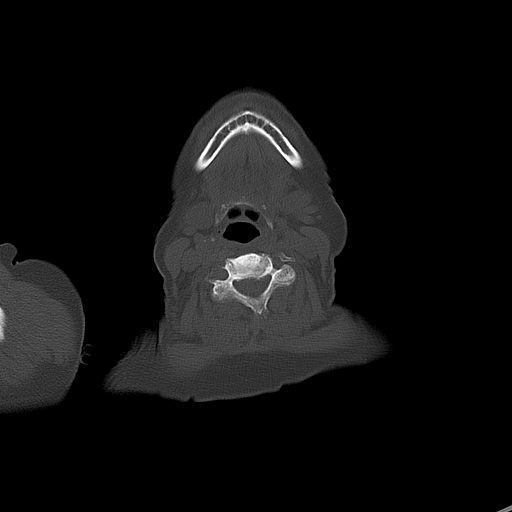

[Series 8: pet sk_thigh nac · axial · 5.0mm · 4.07mm/px · z∈[-711,+129]mm · 5 of 211 slices shown]
[im 1/211]
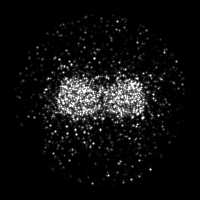
[im 53/211]
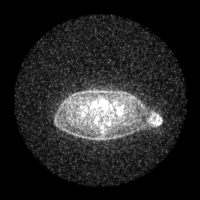
[im 106/211]
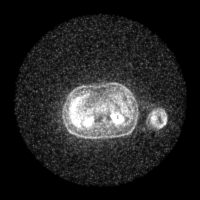
[im 158/211]
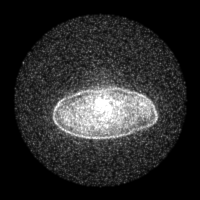
[im 211/211]
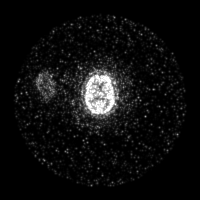

[Series 604: range-ct sk_thigh 5.0 (id)<alpha range> · 2 of 84 slices shown (1 of 2)]
[im 1/84]
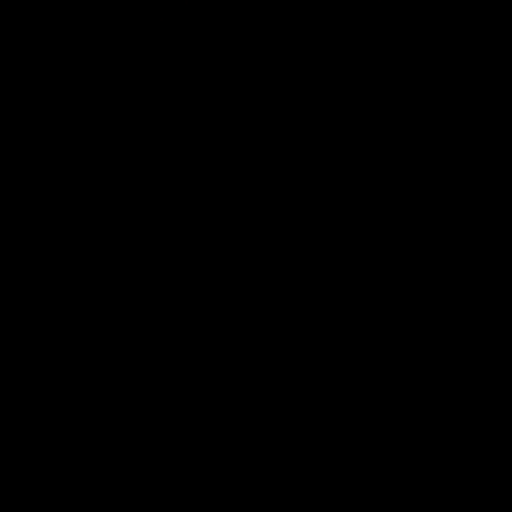
[im 84/84]
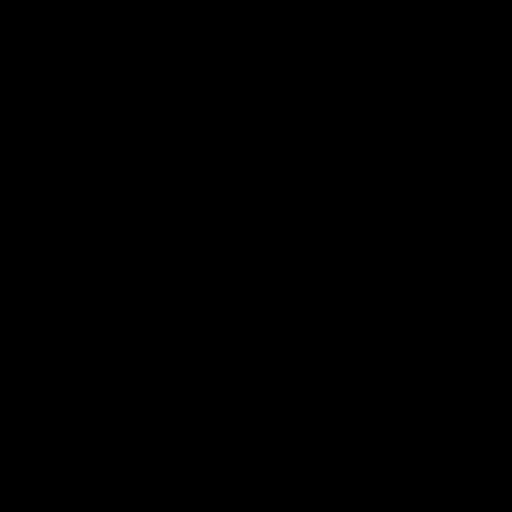

[Series 605: mip collection · coronal · 1.74mm/px · 1 of 32 slices shown]
[im 1/32]
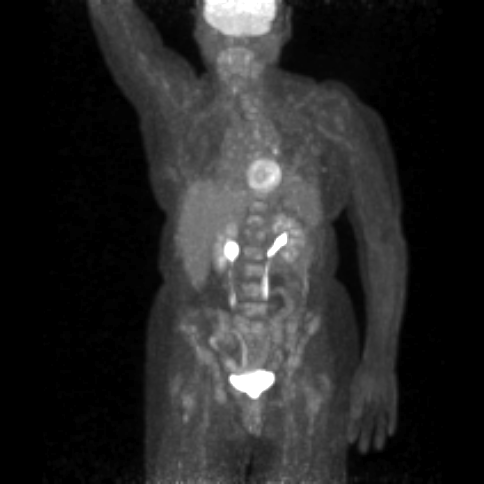

[Series 606: range-ct sk_thigh 5.0 (id)<alpha range> · 5 of 195 slices shown (2 of 2)]
[im 1/195]
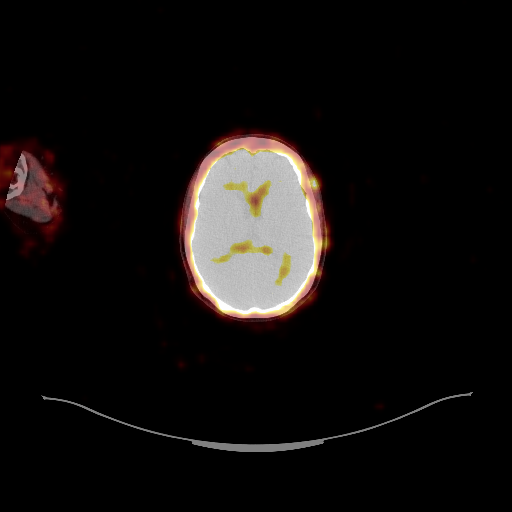
[im 49/195]
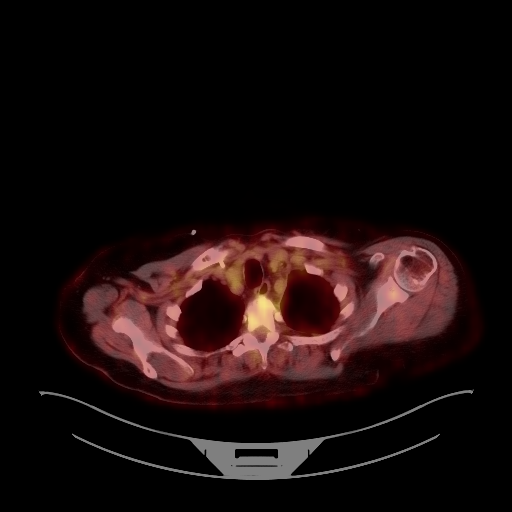
[im 98/195]
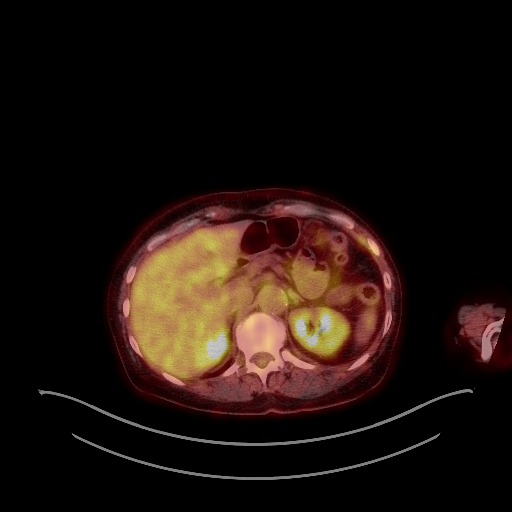
[im 146/195]
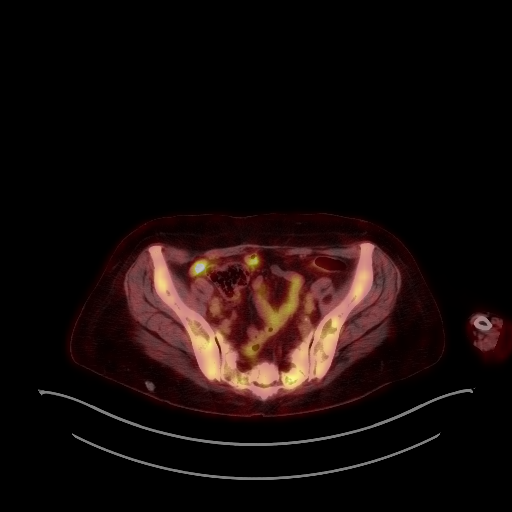
[im 195/195]
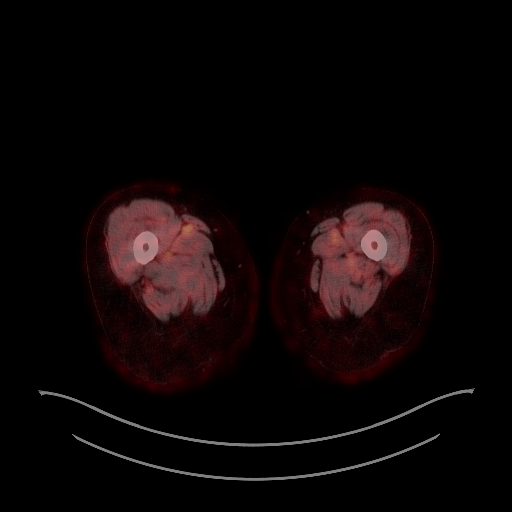

[Series 1558: results mm oncology reading · 4.0mm · 1.32mm/px · 1 of 8 slices shown]
[im 1/8]
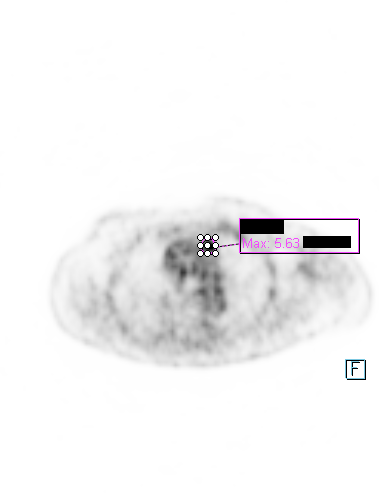

[25 of 25 positions shown; findings below may reference images not displayed]

FINDINGS: NECK

Previously seen 1 cm right-sided level 2 lymph node (image 27 series
4) remains hypermetabolic with SUV max = 6.1 today compared to
previously.

CHEST

Multiple pulmonary nodules and masses are again seen bilaterally.

Index lesion in the inferior segment of the lingula measures 3.4 x
1.9 cm today compared to 3.6 x 2.4 cm previously. SUV max =
today compared to 3.6 previously.

Previously measured pleural soft tissue in the medial aspect of the
left lower hemi thorax is difficult to reproducibly measure, but is
about 4.2 x 4.3 cm today compared to 4.1 x 4.0 cm previously. SUV
max = 4.6 today compared to 3.8-5.3 previously.

Left hilar hypermetabolism demonstrates SUV max = 9.3 today compared
to 7.8 previously. Subcarinal and prevascular hypermetabolic lymph
nodes persist. The index prevascular node demonstrates SUV max =
today compared to 6.9 previously and is stable at 11 mm.

Numerous other pulmonary nodules are seen scattered through both
lungs. Heart size is upper normal. Coronary artery calcification is
evident. Right Port-A-Cath tip is positioned at the distal SVC
level.

ABDOMEN/PELVIS

No abnormal hypermetabolic activity within the liver, pancreas,
adrenal glands, or spleen. No hypermetabolic lymph nodes in the
abdomen or pelvis.

Small to moderate hiatal hernia. Gallbladder surgically absent.
Abdominal aortic atherosclerosis without aneurysm. Bilateral groin
hernias contain only fat. Uterus is surgically absent.

SKELETON

Widespread bony metastatic disease is again noted while much of the
bony involvement appears relatively stable, there is a lesion in the
anterior left iliac crest this shows interval decrease in
hypermetabolism with SUV max = 5 today compared 8.3 previously.
Other scattered areas of spinal involvement show decrease in
hypermetabolism. The L2 vertebral body lesion demonstrates SUV max =
6.5 today compared to 8.0 previously it appears less confluent.
IMPRESSION: 1. No generalized trend of worsening or improving disease. Much of
the hypermetabolic disease is relatively stable on CT and PET
imaging although an index prevascular lymph node shows slight
decrease in hypermetabolism and scattered bony deposits also show
slight interval decrease in FDG uptake.

## 2017-08-05 IMAGING — DX DG CHEST 2V
2 series · 2 of 2 positions shown · non-contrast
Comparison: 08/12/2015

CLINICAL DATA: Fever and nausea

EXAM:
CHEST  2 VIEW

[chest pa]
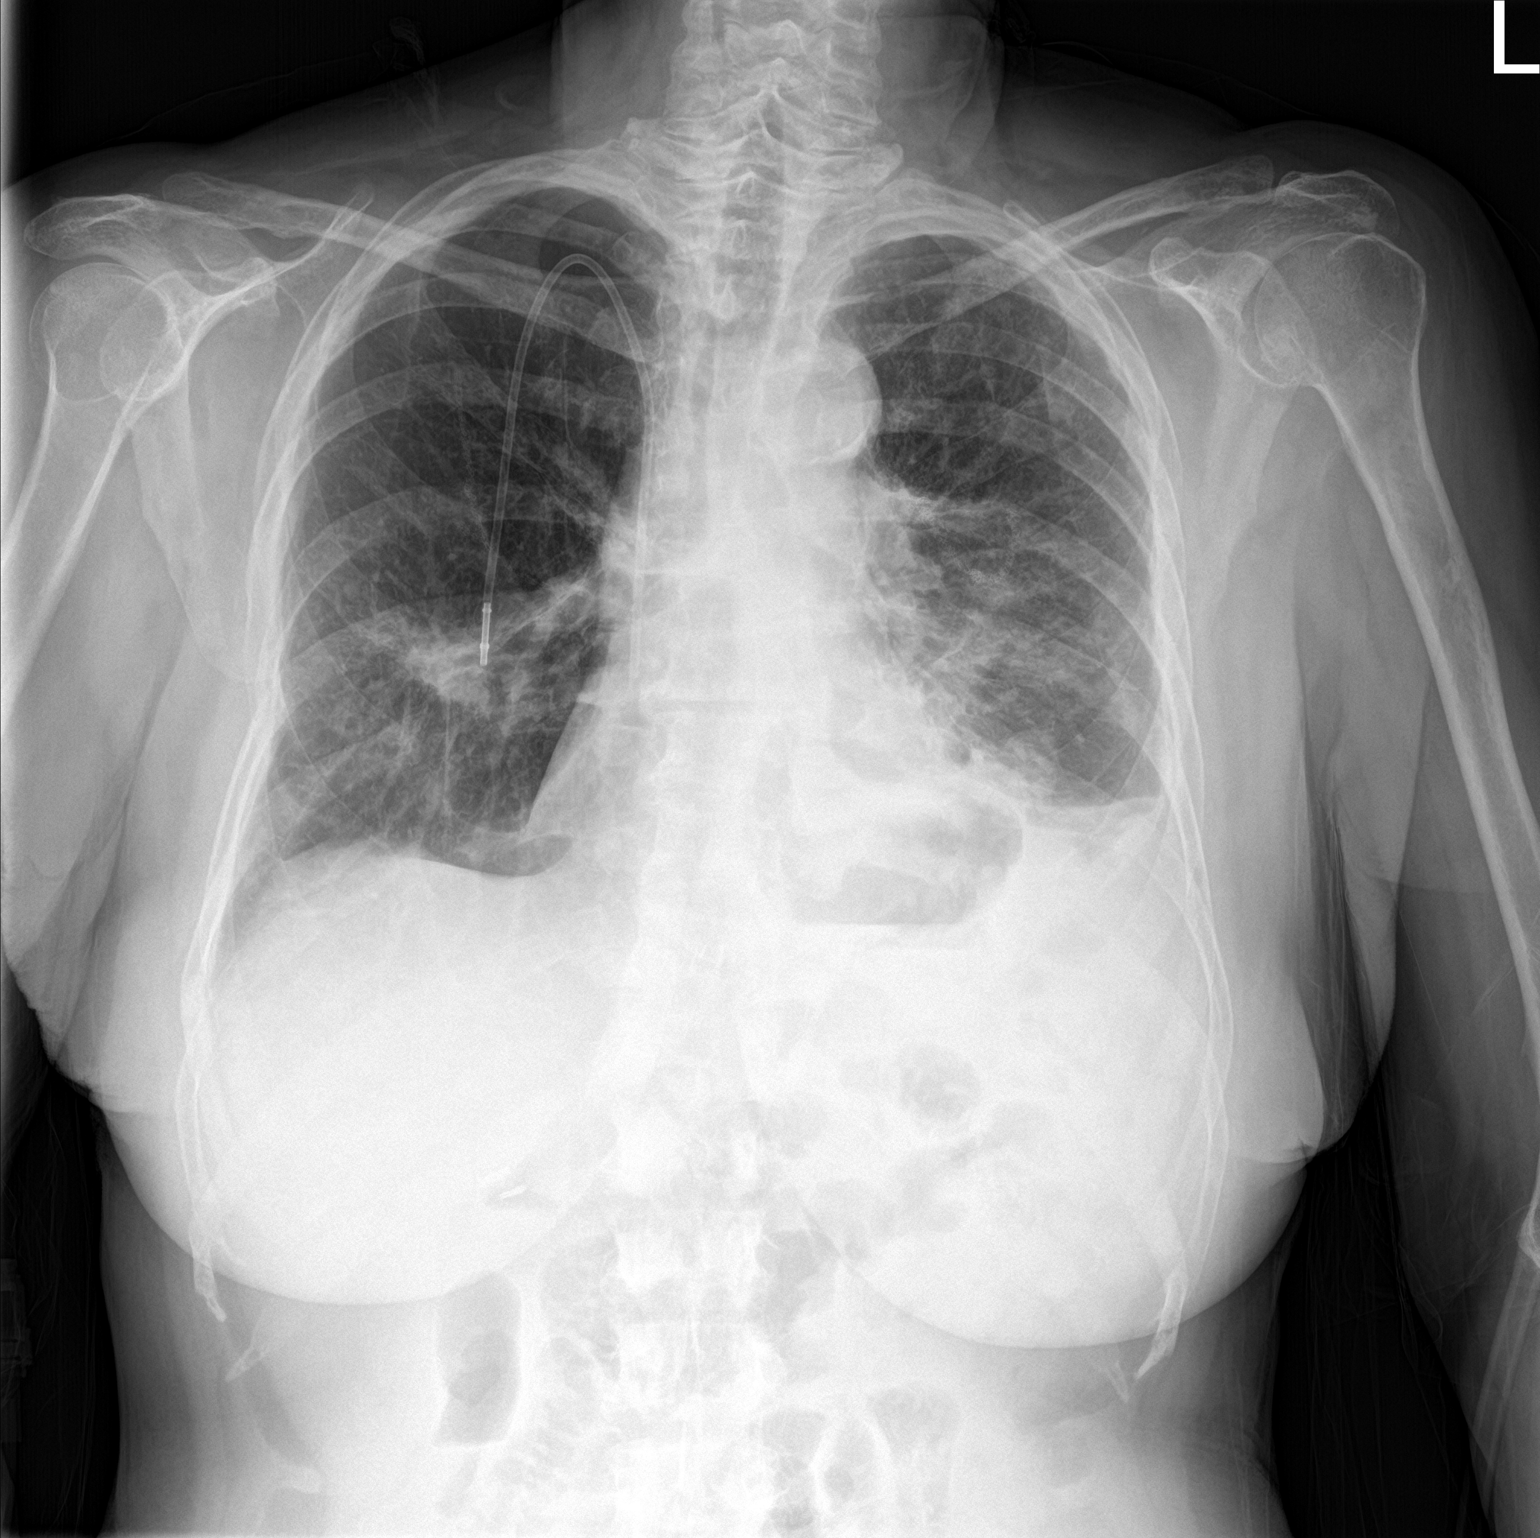

[chest lat]
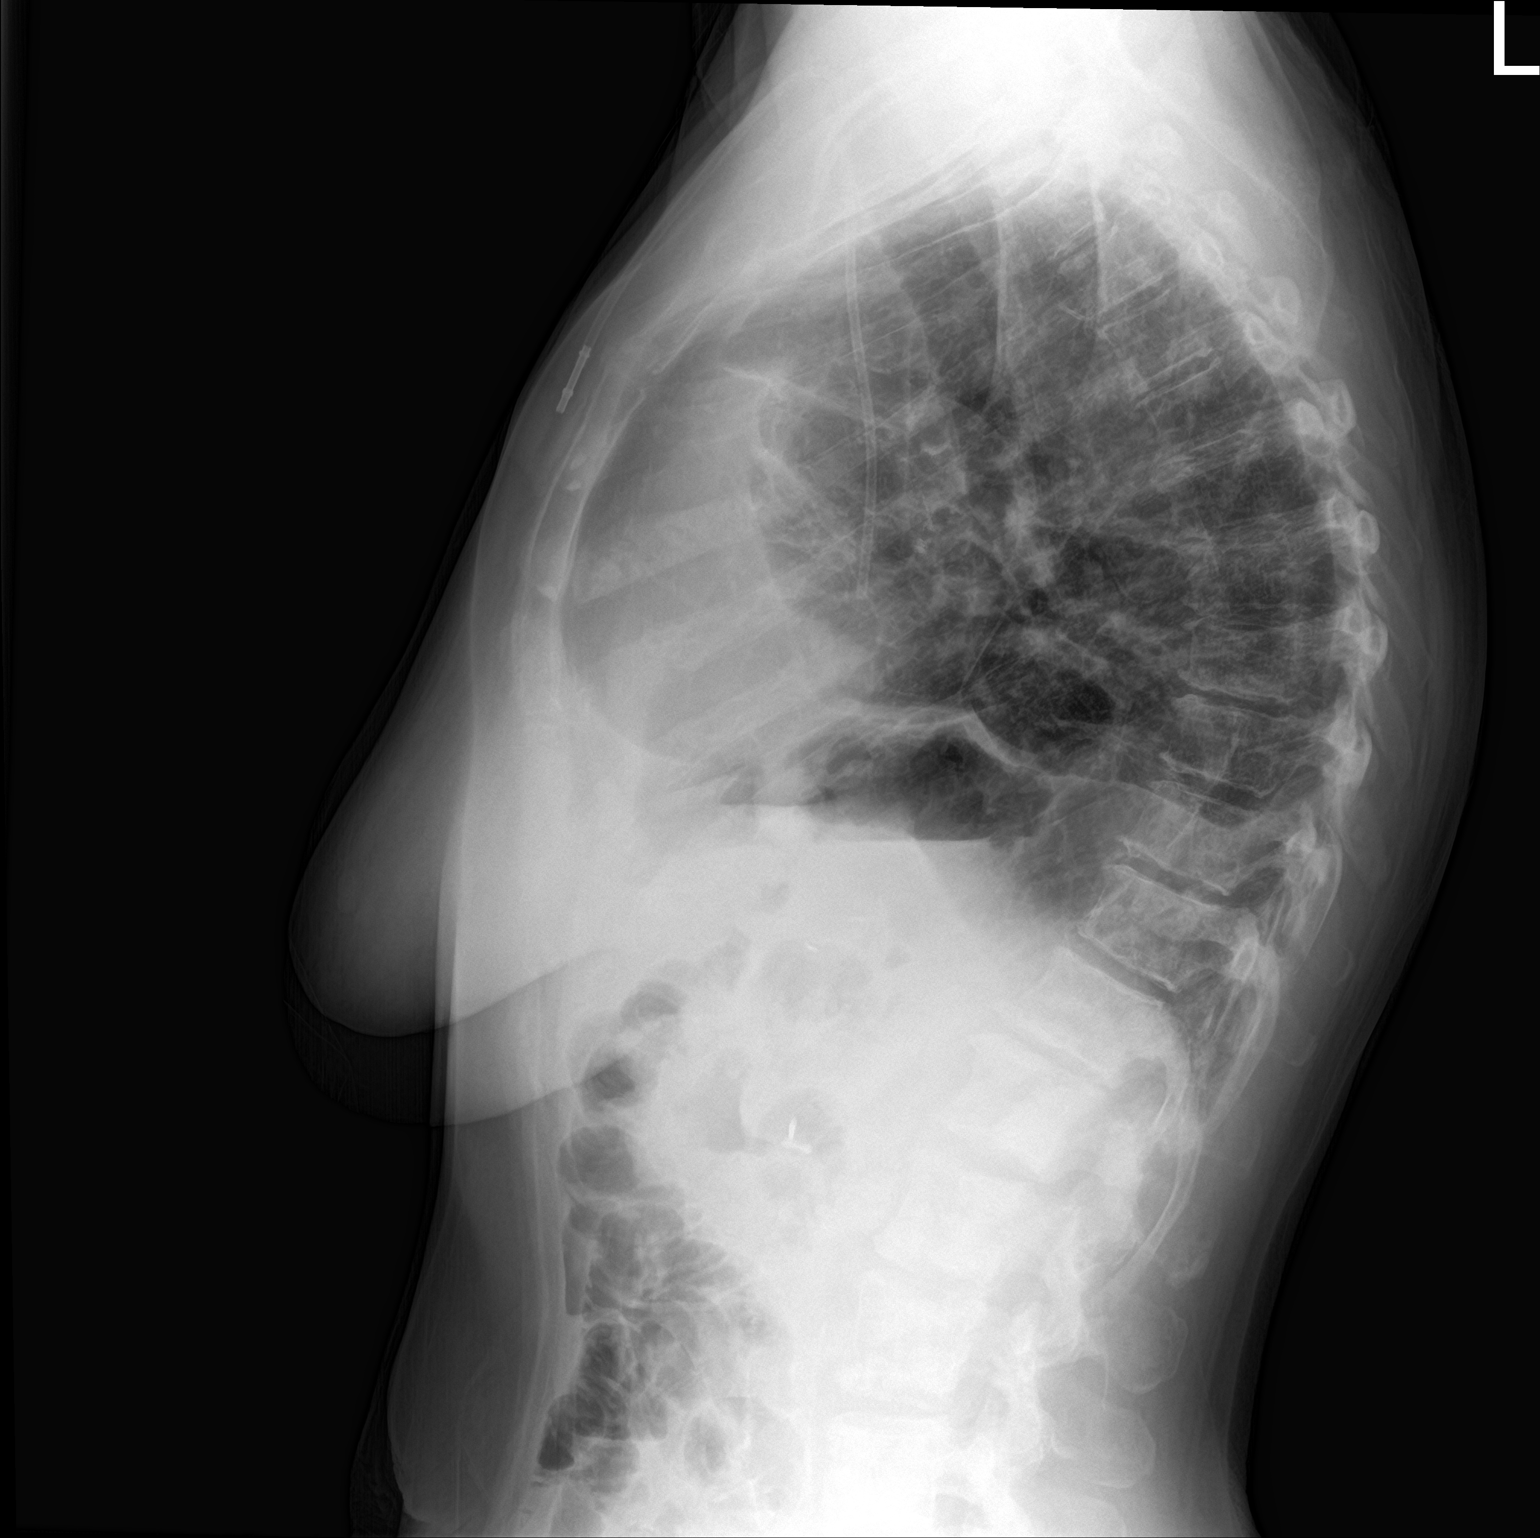

[2 of 2 positions shown; findings below may reference images not displayed]

FINDINGS: Cardiac shadow is stable. Right chest wall port is again seen and
stable patchy changes consistent with multifocal neoplasm slightly
improved from the prior exam. No bony abnormality is noted. No new
focal infiltrate is seen. Multiple areas of bony sclerosis are noted
consistent with metastatic disease.
IMPRESSION: Slight improvement in the appearance of multifocal disease.

## 2017-08-05 IMAGING — CT CT ABD-PELV W/ CM
2 of 5 series · 16 of 46 positions shown, 18 images · IV contrast (APPLIED)
Comparison: PET-CT on 09/11/2015 and AP CT on 06/27/2015

CLINICAL DATA: Generalized abdominal pain with nausea and vomiting
for 3 months. Bilateral lung carcinoma.

EXAM:
CT ABDOMEN AND PELVIS WITH CONTRAST
TECHNIQUE: Multidetector CT imaging of the abdomen and pelvis was performed
using the standard protocol following bolus administration of
intravenous contrast.
CONTRAST:  100mL VOT61Q-NMM IOPAMIDOL (VOT61Q-NMM) INJECTION 61%

[Series 2: axial st · axial · 0.83mm/px · z∈[+529,+944]mm · 13 of 95 slices shown, 15 images]
[im 6/95  soft-tissue]
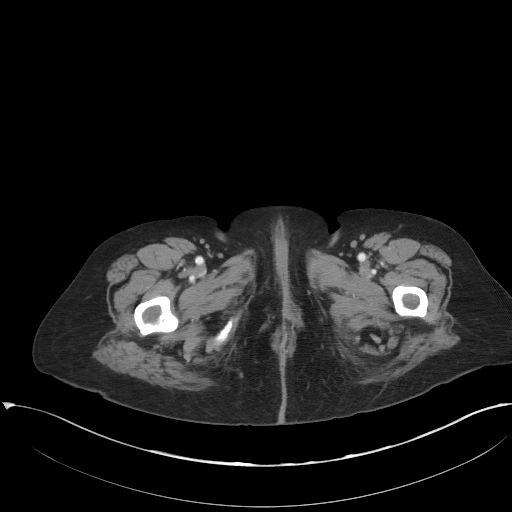
[im 6/95  bone]
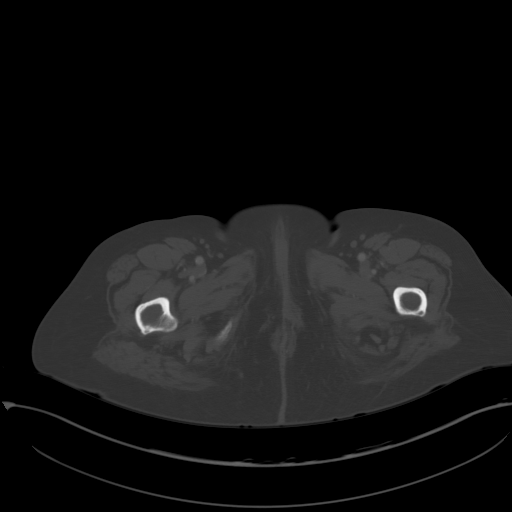
[im 11/95  soft-tissue]
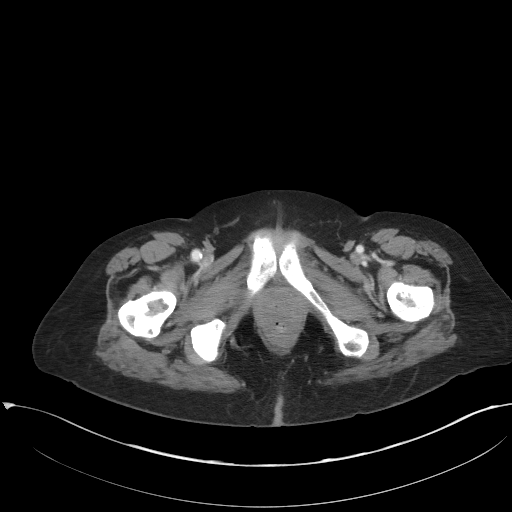
[im 21/95  soft-tissue]
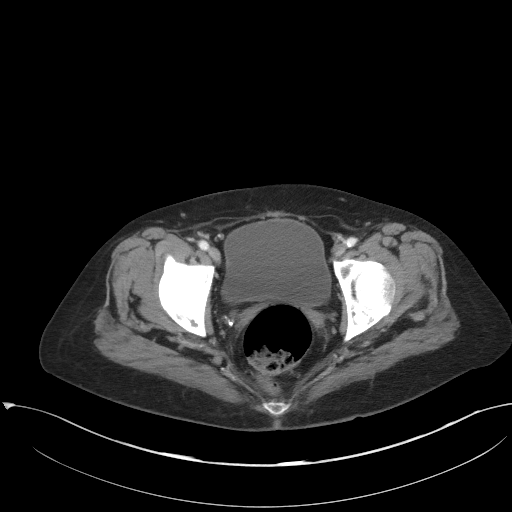
[im 27/95  soft-tissue]
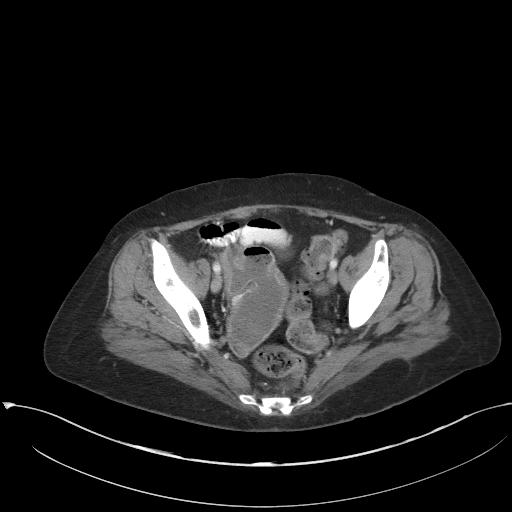
[im 32/95  soft-tissue]
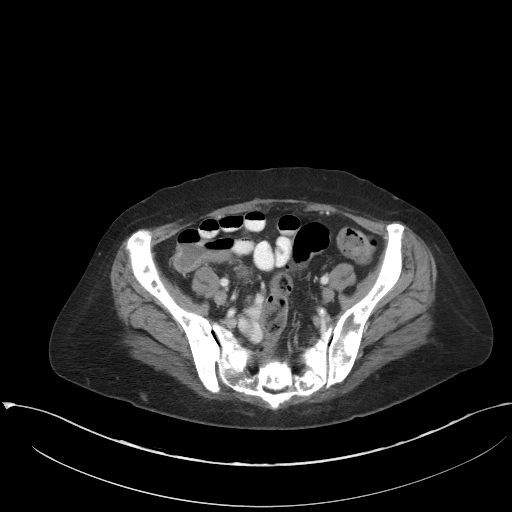
[im 42/95  soft-tissue]
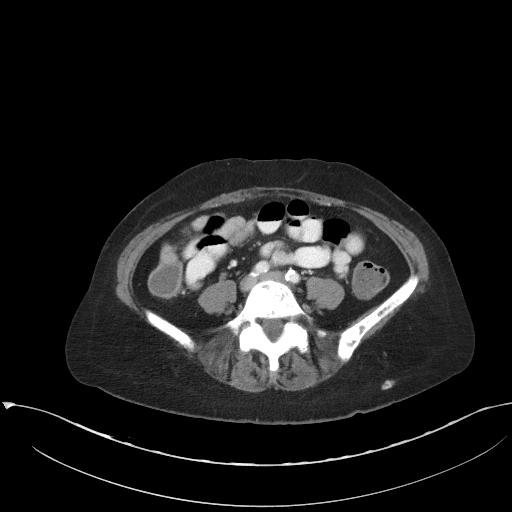
[im 48/95  soft-tissue]
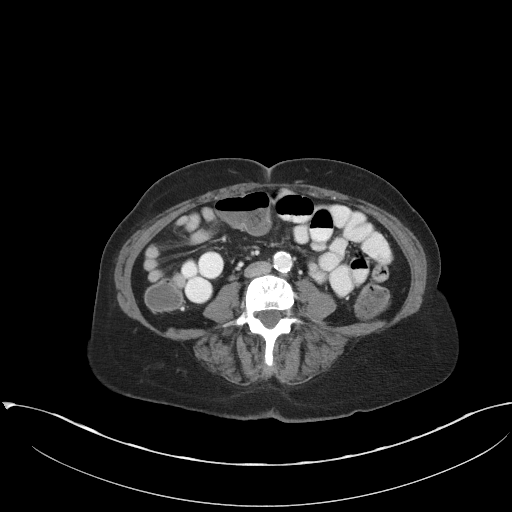
[im 53/95  soft-tissue]
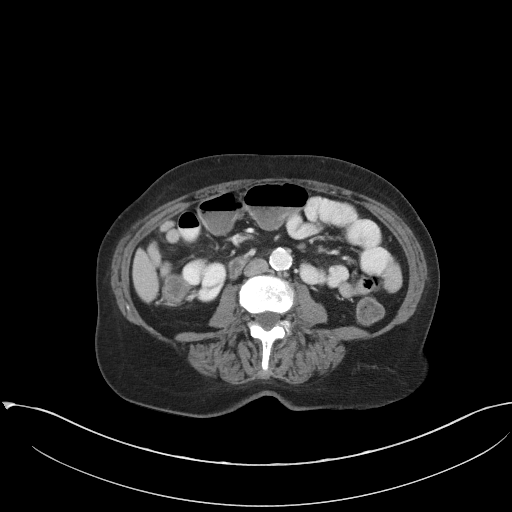
[im 63/95  soft-tissue]
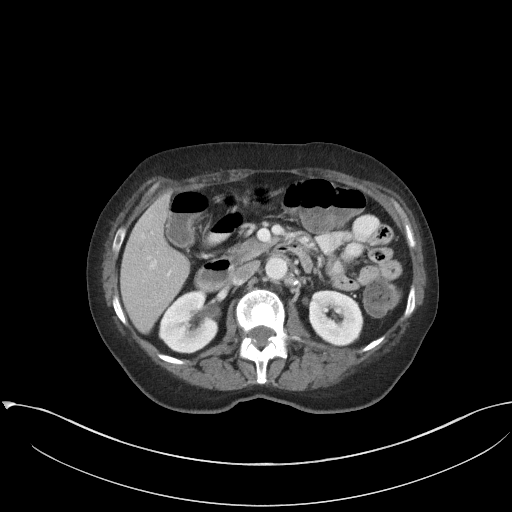
[im 63/95  bone]
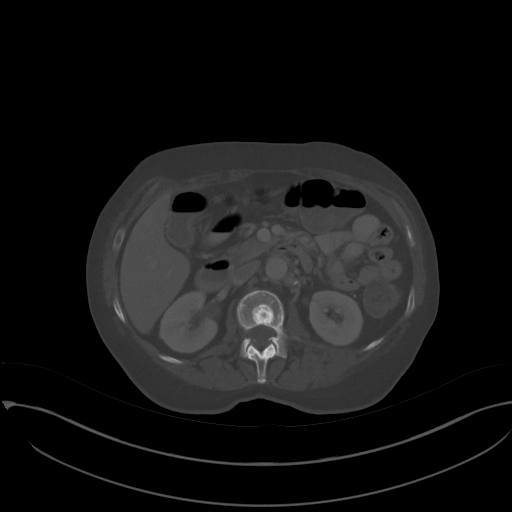
[im 68/95  soft-tissue]
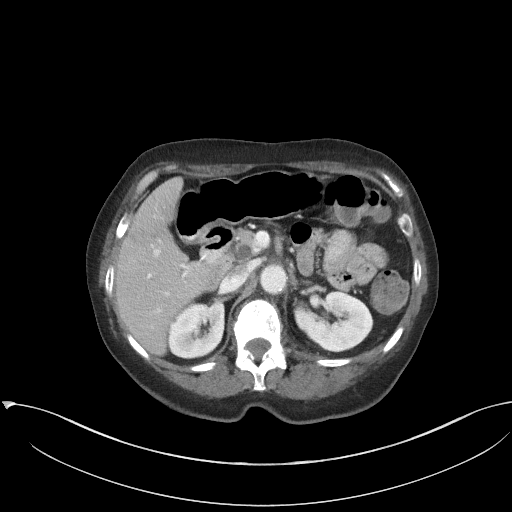
[im 74/95  soft-tissue]
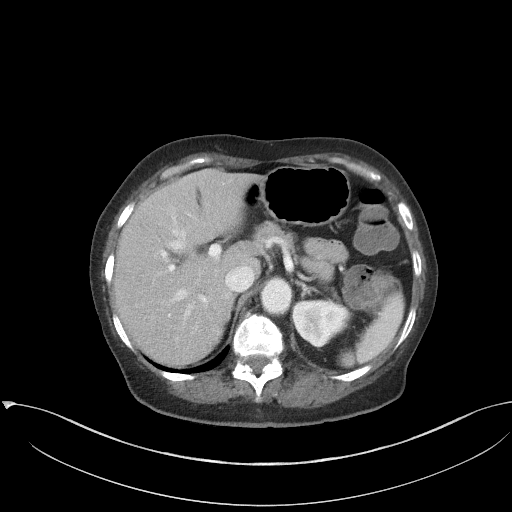
[im 84/95  soft-tissue]
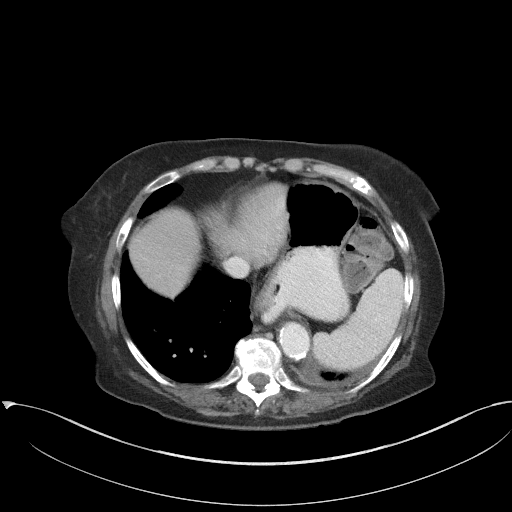
[im 89/95  soft-tissue]
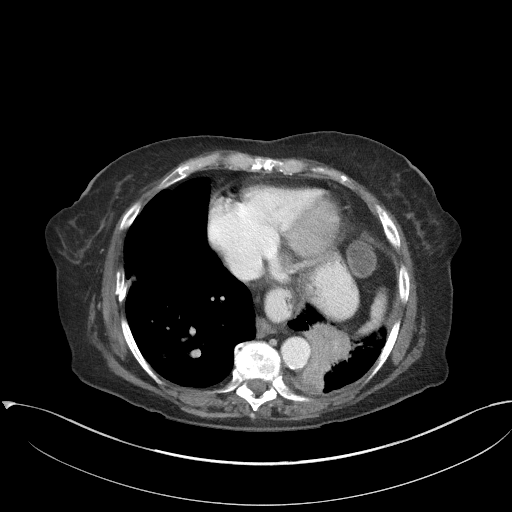

[Series 5: coronal st · coronal · 0.82mm/px · 3 of 76 slices shown]
[im 26/76  soft-tissue]
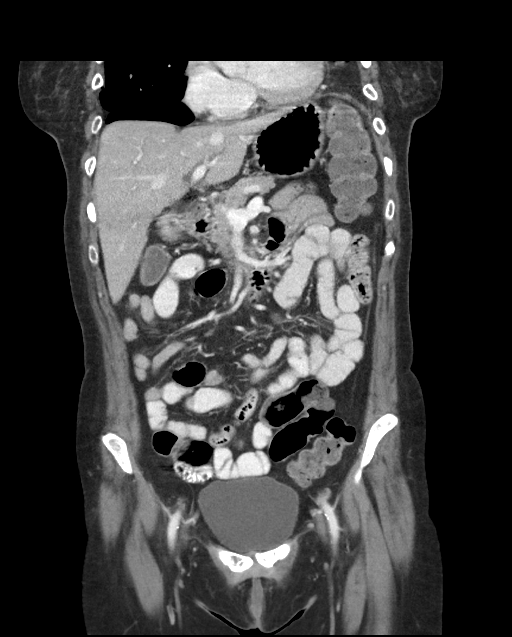
[im 34/76  soft-tissue]
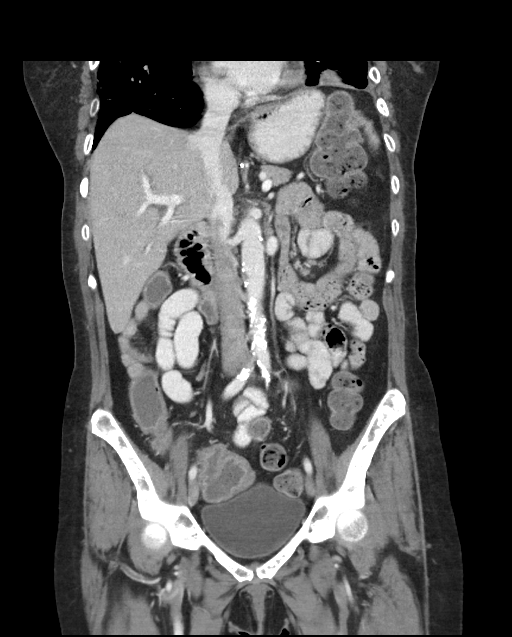
[im 42/76  soft-tissue]
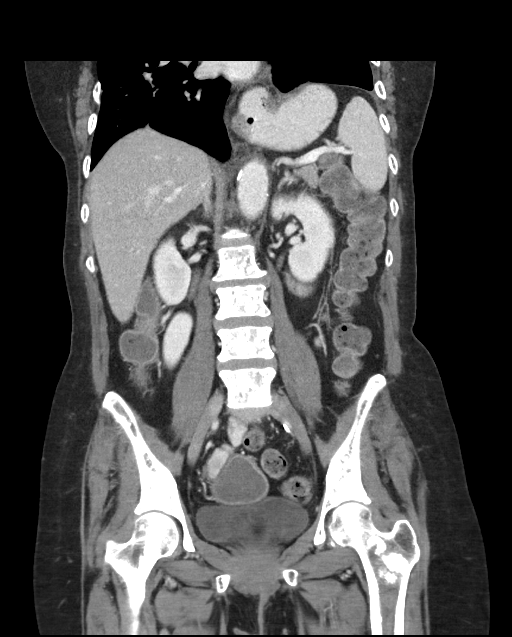

[16 of 46 positions shown; findings below may reference images not displayed]

FINDINGS: Lower chest: Irregular masses in the lingula and left lower lobe
appears stable. Ill-defined airspace opacity in the central right
lower lobe is also unchanged.

Hepatobiliary: No masses or other significant abnormality. Prior
cholecystectomy again noted. Mild biliary ductal dilatation remains
stable.

Pancreas: No mass, inflammatory changes, or other significant
abnormality.

Spleen: Within normal limits in size and appearance.

Adrenals/Urinary Tract: No masses identified. No evidence of
hydronephrosis.

Stomach/Bowel: Small hiatal hernia again noted. No evidence of
obstruction, inflammatory process, or abnormal fluid collections.

Vascular/Lymphatic: No pathologically enlarged lymph nodes. No
evidence of abdominal aortic aneurysm. Aortic atherosclerosis noted.

Reproductive: Prior hysterectomy noted. Adnexal regions are
unremarkable in appearance.

Other: Small bilateral inguinal hernias containing only fat are
unchanged.

Musculoskeletal: Diffuse sclerotic bone metastases show no
significant change in appearance.
IMPRESSION: No evidence of soft tissue metastatic disease or other acute
findings within the abdomen or pelvis.

Stable small hiatal hernia.

Stable diffuse sclerotic bone metastases.

Aortic atherosclerosis noted.

Stable left lower lung masses and ill-defined airspace opacity in
central right lower lobe.

## 2017-09-18 IMAGING — DX DG CHEST 2V
2 series · 2 of 2 positions shown · non-contrast
Comparison: 09/21/2015 and earlier.

CLINICAL DATA: 65-year-old female with shortness of breath for 7
weeks. Treated metastatic lung cancer. Initial encounter.

EXAM:
CHEST  2 VIEW

[chest pa]
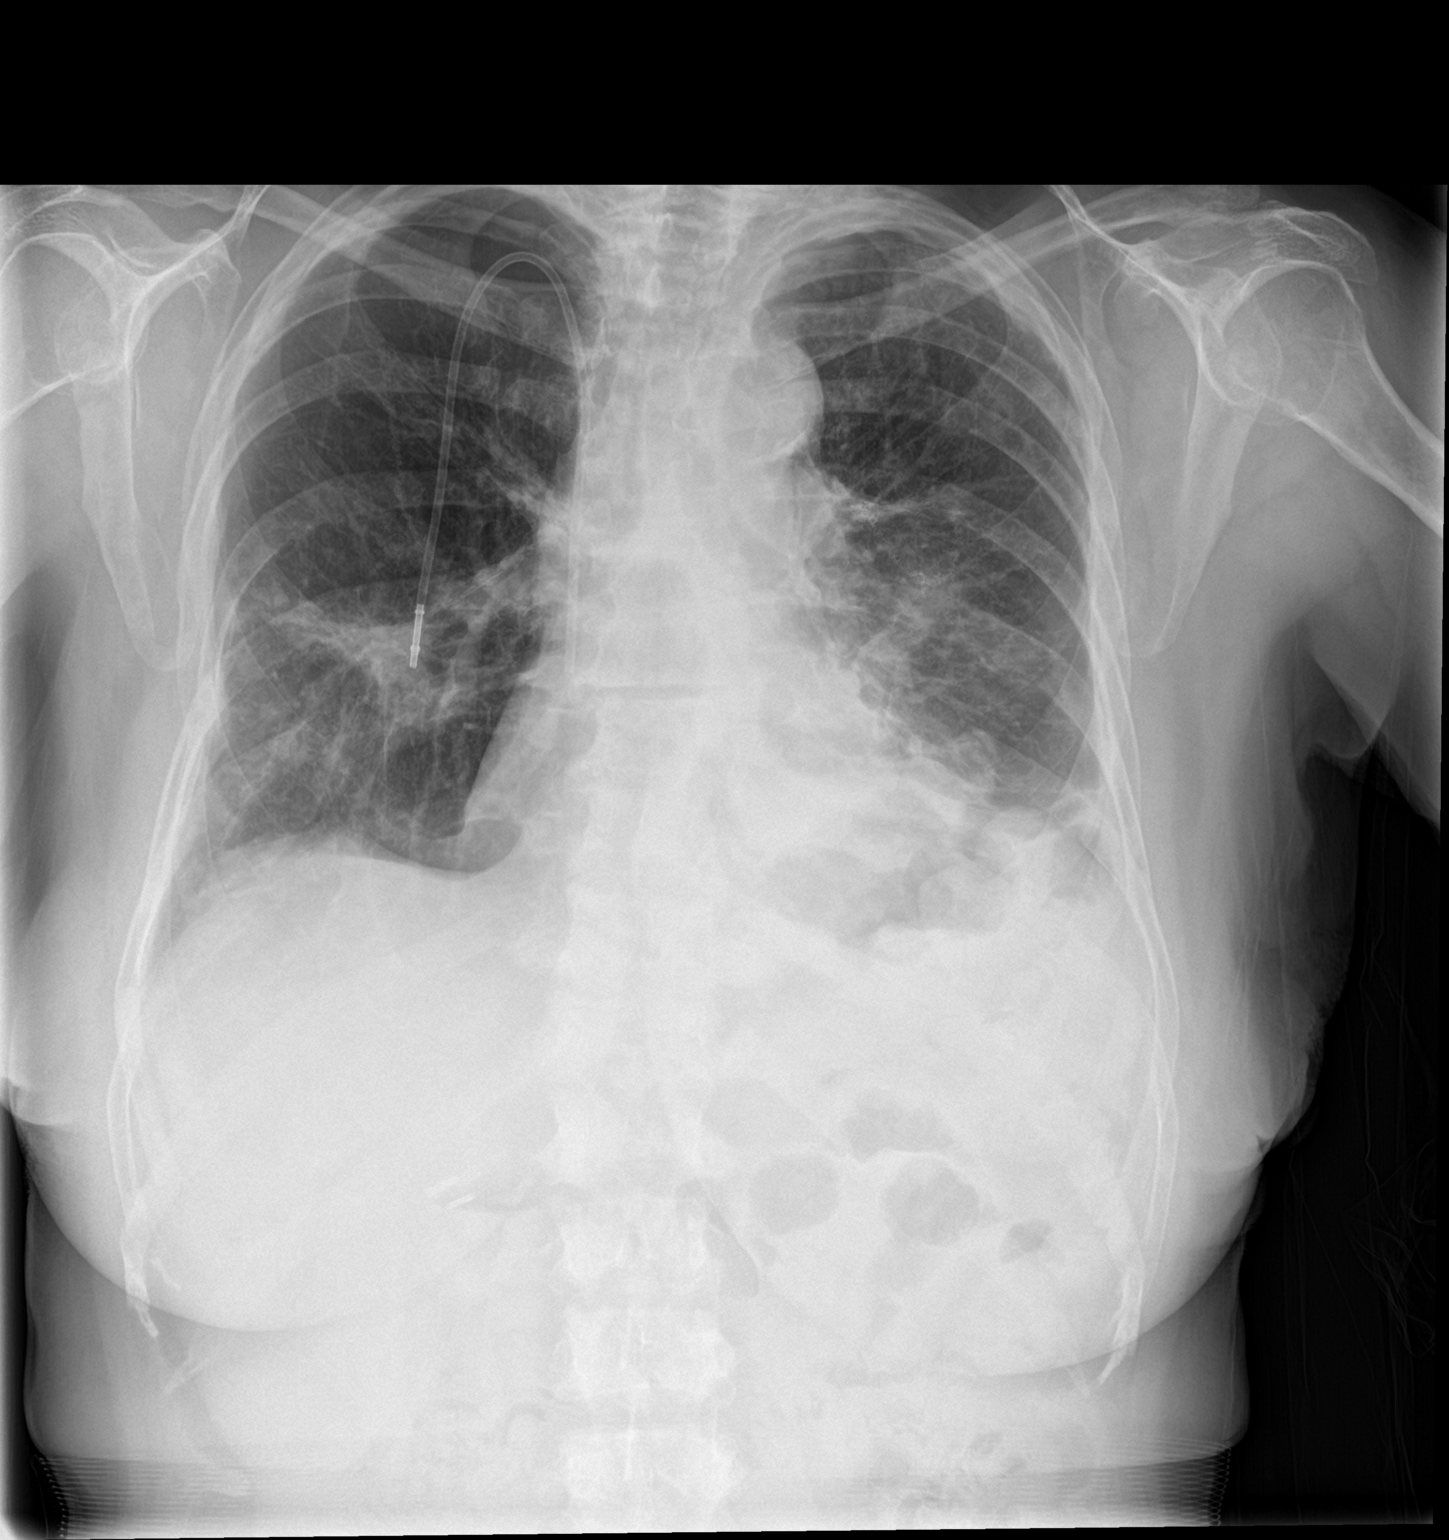

[chest lat]
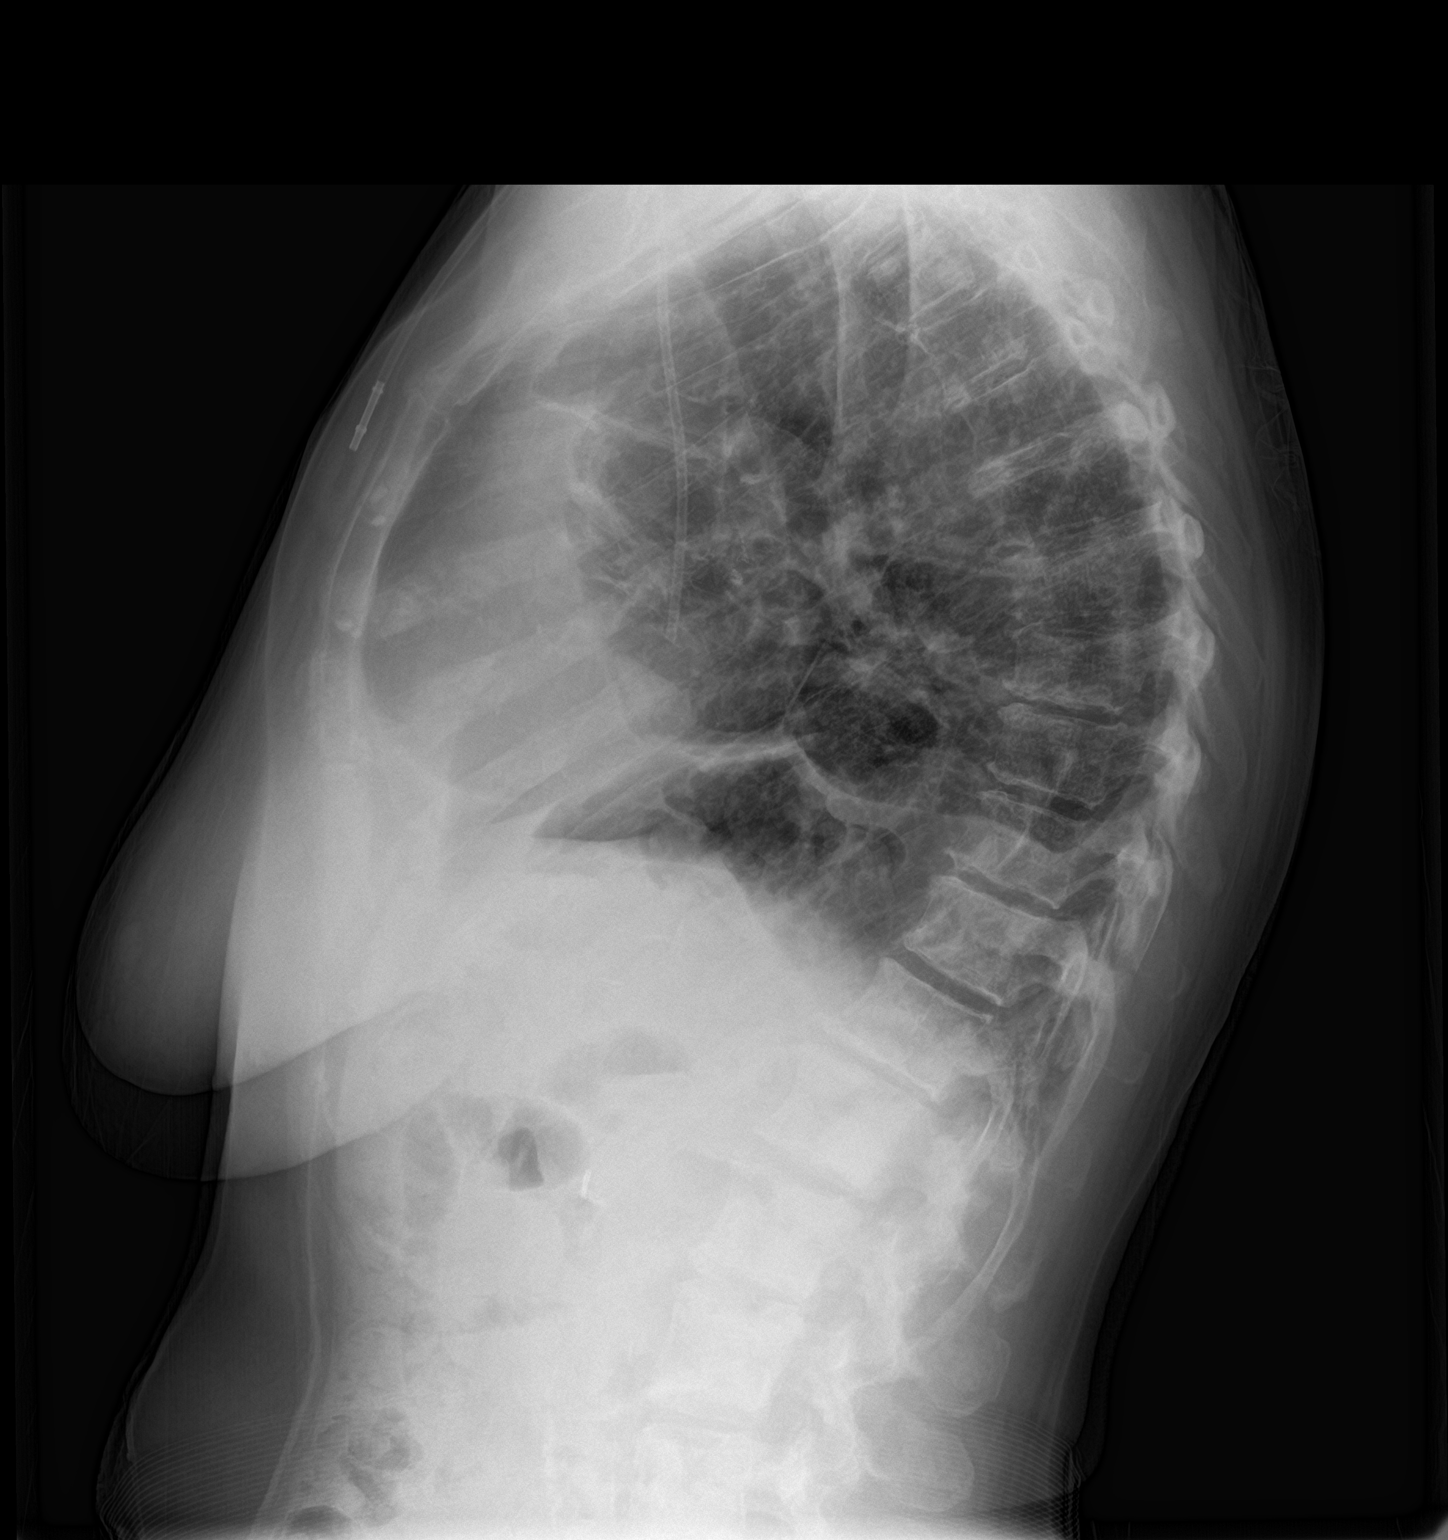

[2 of 2 positions shown; findings below may reference images not displayed]

FINDINGS: Right chest porta cath remains in place. Nodular opacification at
the left lung base is radiographically stable since 09/21/2015.
Spiculated architectural distortion in the mid right lung also
appear stable. No superimposed pneumothorax, pulmonary edema or
definite pleural effusion. Stable cardiac size and mediastinal
contours. Visualized tracheal air column is within normal limits.
Calcified aortic atherosclerosis.

Widespread sclerotic metastatic disease is evident, especially in
the lower thoracic and lumbar spine. Stable cholecystectomy clips.
IMPRESSION: Stable radiographic appearance of metastatic lung disease. No new
cardiopulmonary abnormality.

## 2017-10-02 IMAGING — PT NM PET TUM IMG RESTAG (PS) SKULL BASE T - THIGH
8 series · 25 of 25 positions shown · non-contrast
Comparison: 09/11/2015

CLINICAL DATA: Subsequent treatment strategy for metastatic
bilateral lung carcinoma.

EXAM:
NUCLEAR MEDICINE PET SKULL BASE TO THIGH
TECHNIQUE: 6.4 mCi F-18 FDG was injected intravenously. Full-ring PET imaging
was performed from the skull base to thigh after the radiotracer. CT
data was obtained and used for attenuation correction and anatomic
localization.
FASTING BLOOD GLUCOSE:  Value: 86 mg/dl

[Series 4: pet sk_thigh ac · axial · 5.0mm · 4.07mm/px · z∈[-998,-198]mm · 6 of 201 slices shown]
[im 1/201]
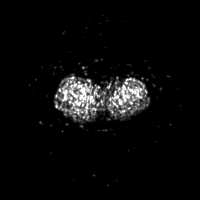
[im 41/201]
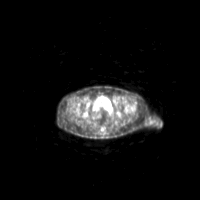
[im 81/201]
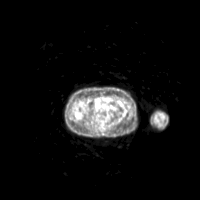
[im 121/201]
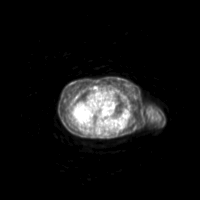
[im 161/201]
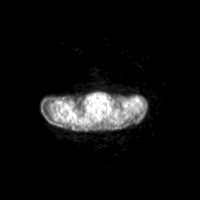
[im 201/201]
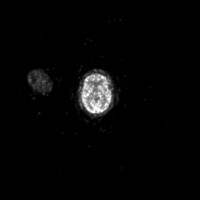

[Series 5: ct sk_thigh 5.0 b31f · axial · 5.0mm · 0.93mm/px · z∈[-998,-198]mm · 5 of 201 slices shown]
[im 1/201]
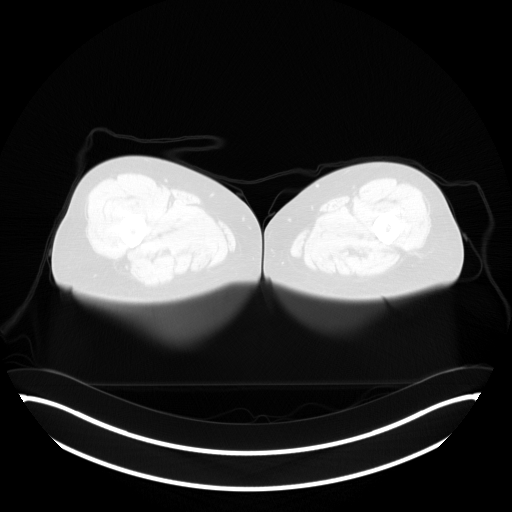
[im 51/201]
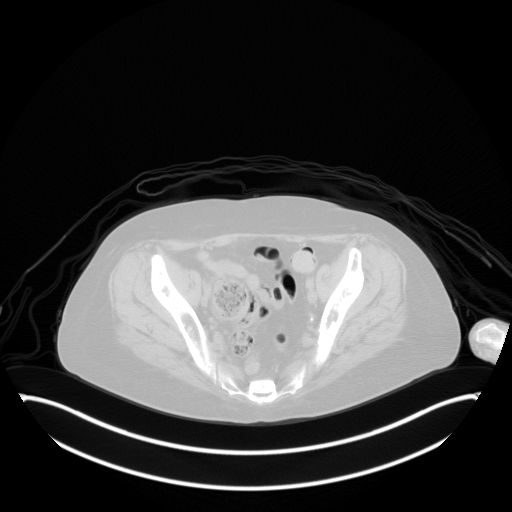
[im 101/201]
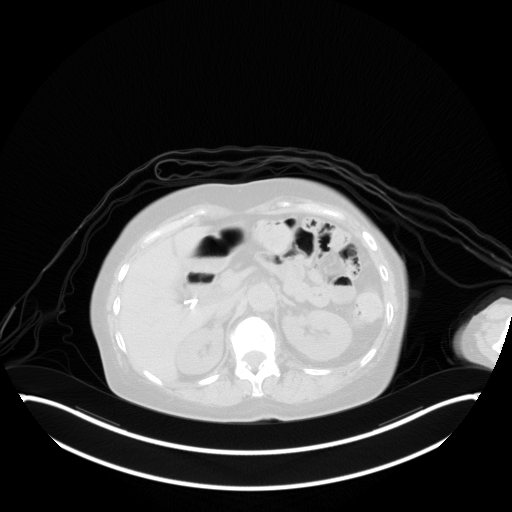
[im 151/201]
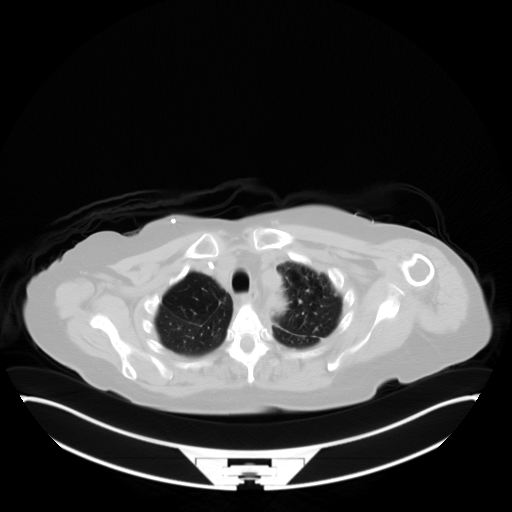
[im 201/201  brain]
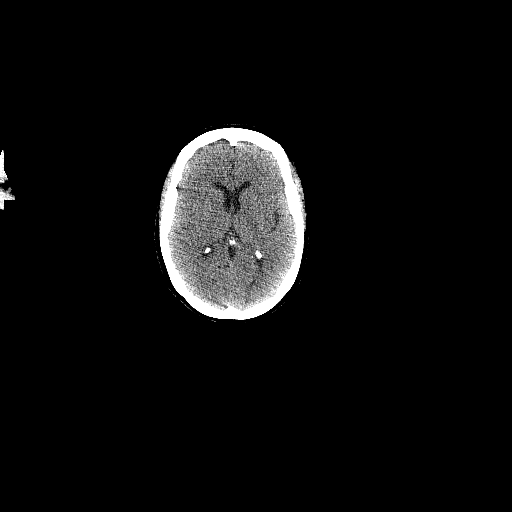

[Series 8: pet sk_thigh nac · axial · 5.0mm · 4.07mm/px · z∈[-998,-198]mm · 5 of 201 slices shown]
[im 1/201]
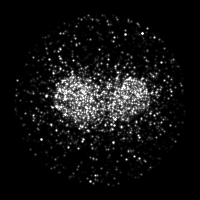
[im 51/201]
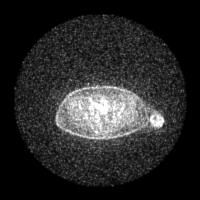
[im 101/201]
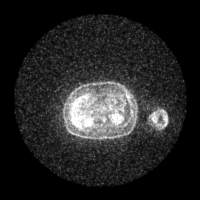
[im 151/201]
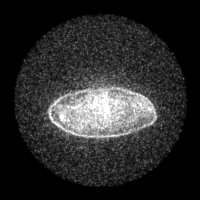
[im 201/201]
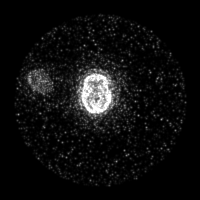

[Series 9: ct sk_thigh 5.0 b70f lung_bone · axial · 5.0mm · 0.55mm/px · 1 of 53 slices shown]
[im 1/53  bone]
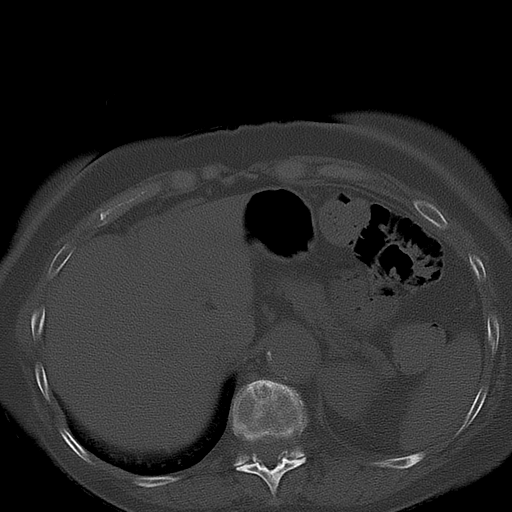

[Series 603: range-ct sk_thigh 5.0 (id)<alpha range> · 2 of 72 slices shown (1 of 2)]
[im 1/72]
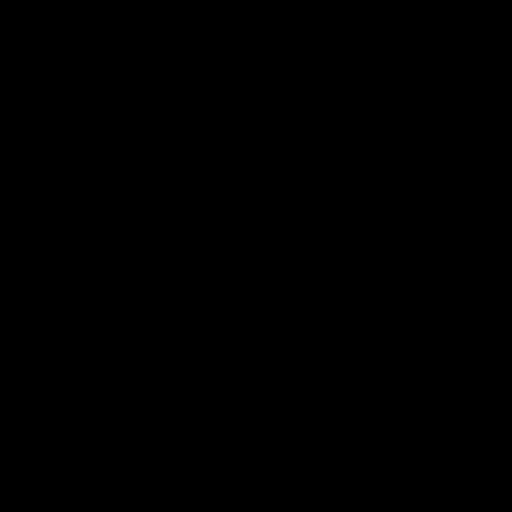
[im 72/72]
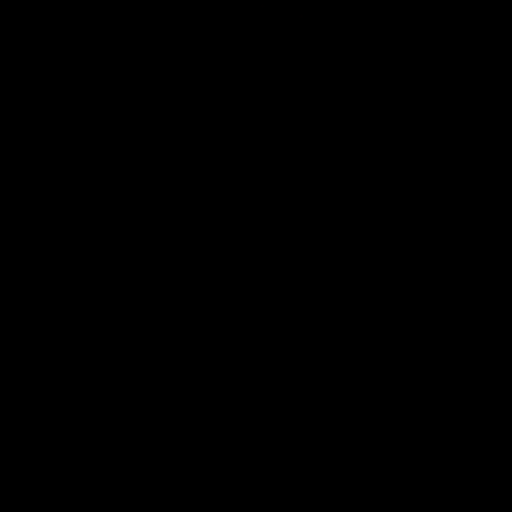

[Series 604: mip collection · coronal · 1.68mm/px · 1 of 32 slices shown]
[im 1/32]
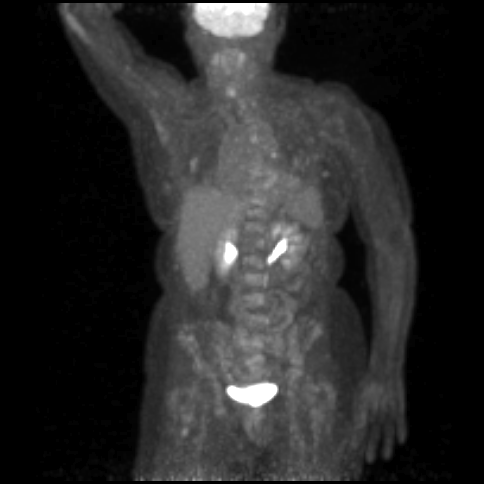

[Series 605: range-ct sk_thigh 5.0 (id)<alpha range> · 4 of 155 slices shown (2 of 2)]
[im 1/155]
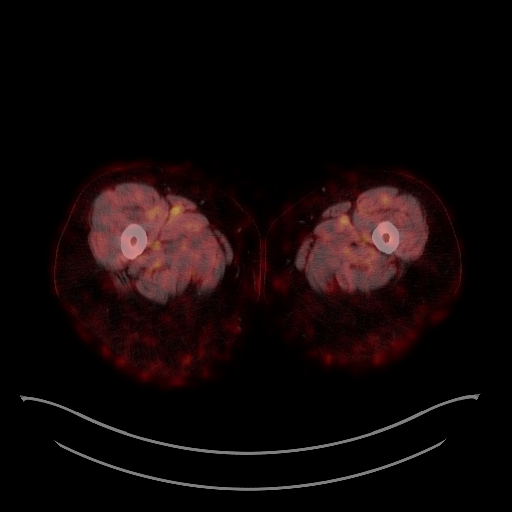
[im 52/155]
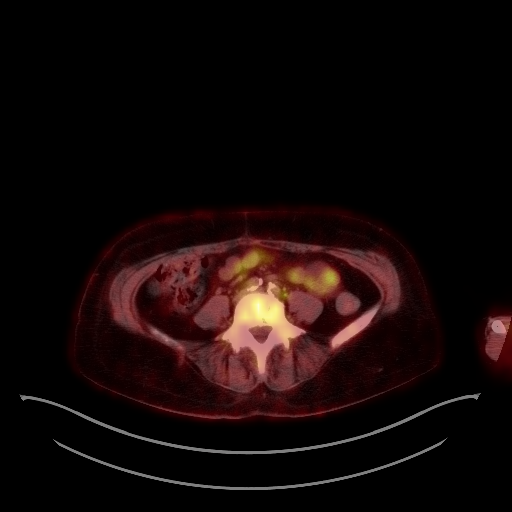
[im 103/155]
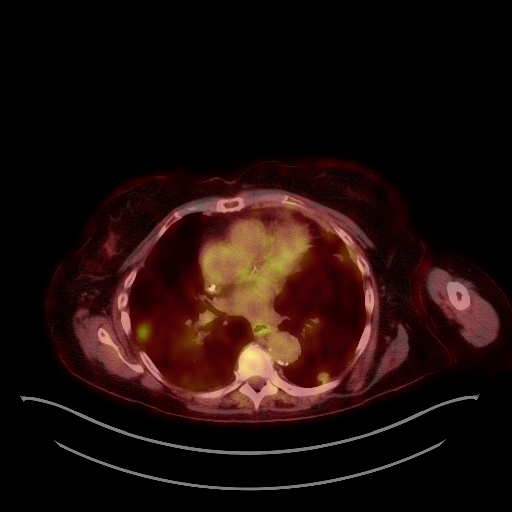
[im 155/155]
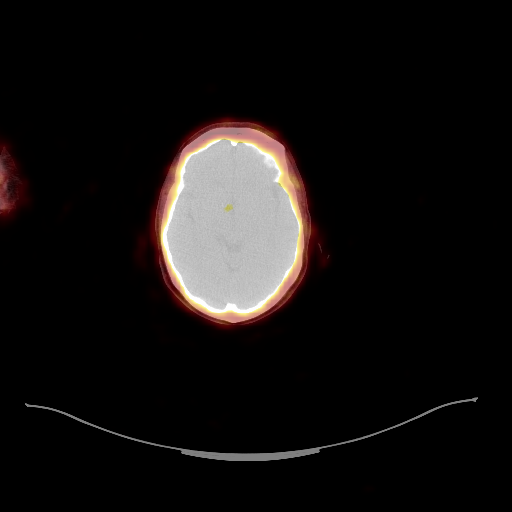

[Series 1666: results mm oncology reading · 0.97mm/px · 1 of 11 slices shown]
[im 1/11]
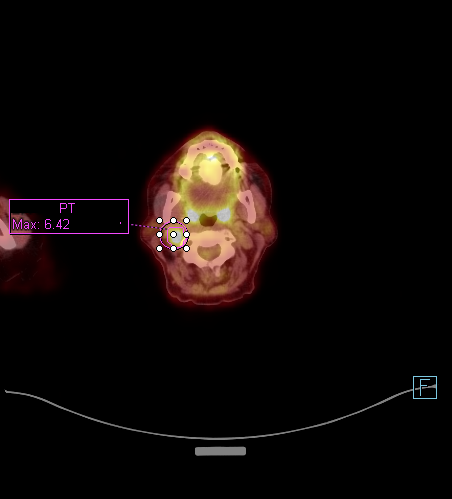

[25 of 25 positions shown; findings below may reference images not displayed]

FINDINGS: NECK

1.5 cm right level 2 hypermetabolic lymph node on image [DATE] shows
no significant change in size or hypermetabolic activity. SUV max
measures 6.4 on today's study compared to 6.1 previously. Sub-cm
left supraclavicular lymph node on image 43/ 4 shows SUV max of
has also stable since prior study.

CHEST

Multiple pulmonary nodules and masses are again seen bilaterally.

Index lesion in the inferior lingula measures 3.1 x 2.2 cm on
current exam compared to 3.4 x 1.9 cm previously. SUV max currently
measures 4.4 compared to 3.7 previously. Soft tissue density in the
posterior left lung base adjacent to the descending aorta currently
measures 4.1 x 4.2 cm on image 71 77/5 compared to 4.2 x 4.3 cm
previously. SUV max currently measures 4.4 compared to
previously.

Ill-defined airspace disease in the central right lower lobe shows
no significant change in appearance or hypermetabolic activity, with
SUV max currently measuring 4.0 compared to 3.9 previously. A new 7
mm nodular density is seen in the right lower lobe on image [DATE]
which is hypermetabolic with SUV max of 4.6.

Left hilar hypermetabolism demonstrates SUV max of 6.7 today
compared to 9.3 previously. Small hypermetabolic mediastinal lymph
nodes persist. Index prevascular lymph node on image 60/5
demonstrates SUV max of 6.3 compared to 5.6 previously, and is
stable in size measuring 11 mm.

ABDOMEN/PELVIS

No abnormal hypermetabolic activity within the liver, pancreas,
adrenal glands, or spleen. No hypermetabolic lymph nodes in the
abdomen or pelvis.

Stable moderate hiatal hernia. Aortic atherosclerosis. Previous
hysterectomy. Small fat containing bilateral inguinal hernias are
stable.

SKELETON

Diffuse sclerotic bone metastases show no significant change in
appearance by CT. Index lesion in L2 vertebral body shows SUV max of
5.4 today compared to 6.5 previously. Other index lesion in the
central sacrum has SUV max of 6.9 on today's study compared to
previously.
IMPRESSION: No significant change in mild hypermetabolic lymphadenopathy within
the neck, mediastinum, and left hilar region.

Single new 7 mm hypermetabolic nodule in right lower lobe. Multiple
other hypermetabolic pulmonary masses and nodules show no
significant change.

No significant change in diffuse hypermetabolic sclerotic bone
metastases.

## 2017-10-26 IMAGING — MR MR CERVICAL SPINE WO/W CM
5 of 8 series · 26 of 48 positions shown · IV contrast (MULTIHANCE)
Comparison: MRI 06/06/2015

CLINICAL DATA: Headache and right-sided neck pain. History of
metastatic lung cancer.

EXAM:
MRI CERVICAL SPINE WITHOUT AND WITH CONTRAST
TECHNIQUE: Multiplanar and multiecho pulse sequences of the cervical spine, to
include the craniocervical junction and cervicothoracic junction,
were obtained according to standard protocol without and with
intravenous contrast.
CONTRAST:  10 cc MultiHance

[Series 2: T1 · sagittal · 3.0mm · 0.41mm/px · 3 of 13 slices shown]
[im 1/13]
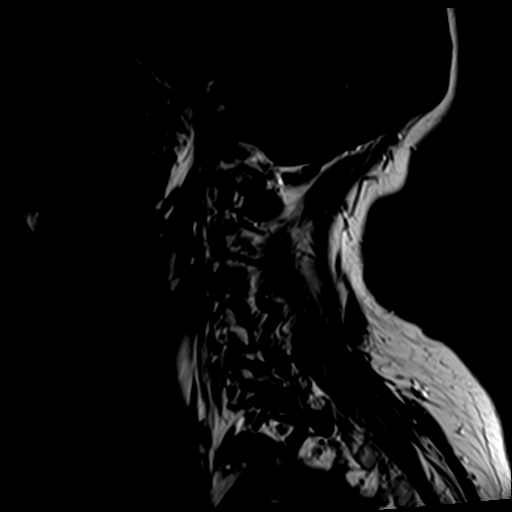
[im 7/13]
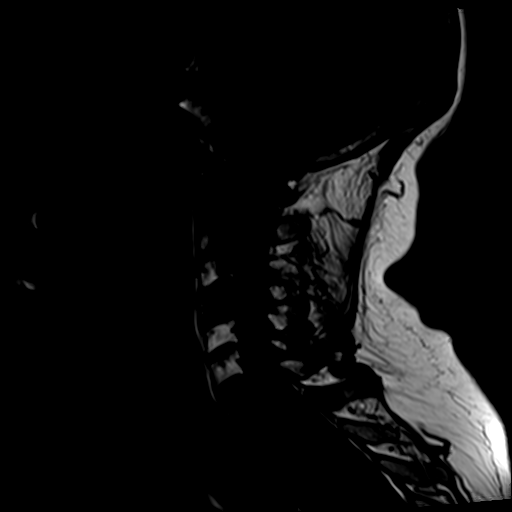
[im 13/13]
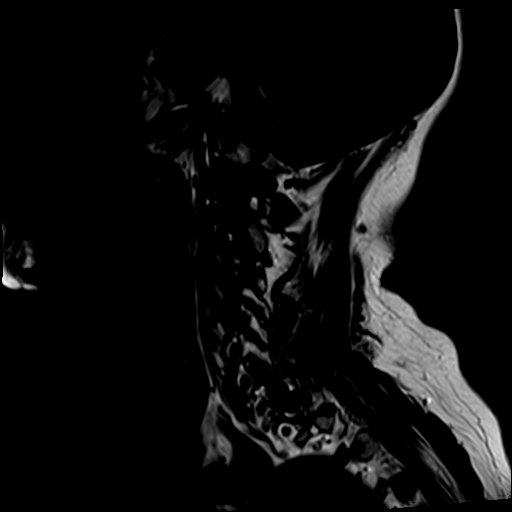

[Series 3: T2 · sagittal · 3.0mm · 0.41mm/px · 3 of 13 slices shown (1 of 2)]
[im 1/13]
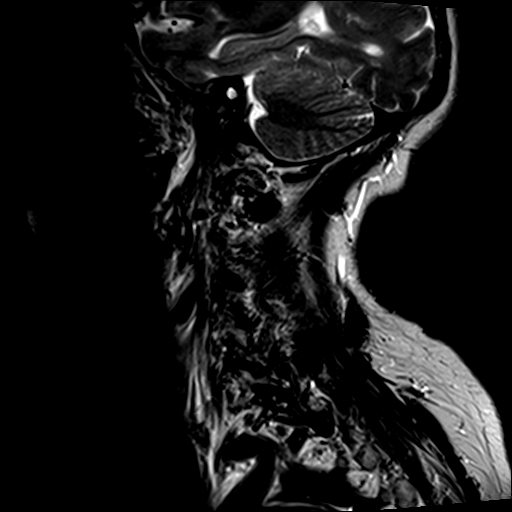
[im 7/13]
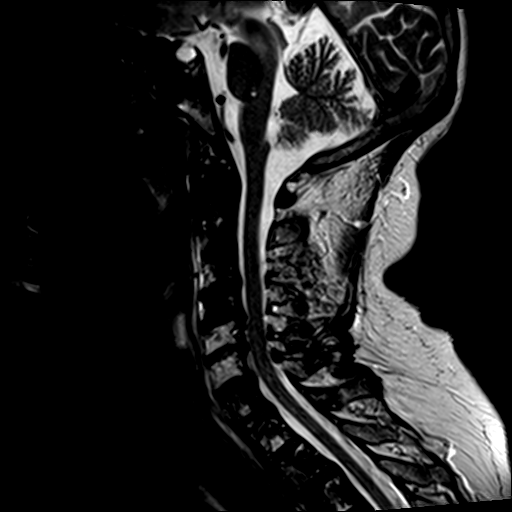
[im 13/13]
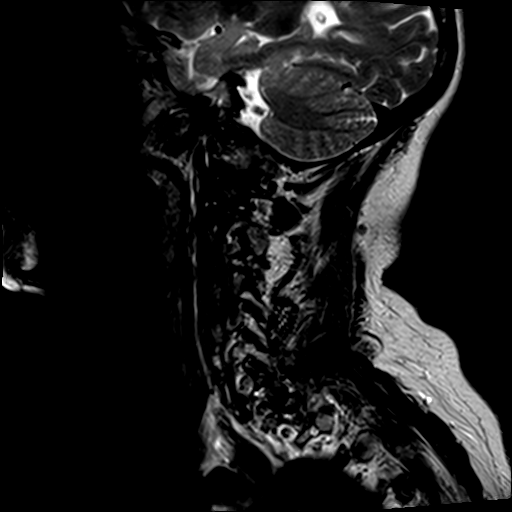

[Series 5: T2 · axial · 3.0mm · 0.39mm/px · z∈[-92,+61]mm · 9 of 44 slices shown (2 of 2)]
[im 1/44]
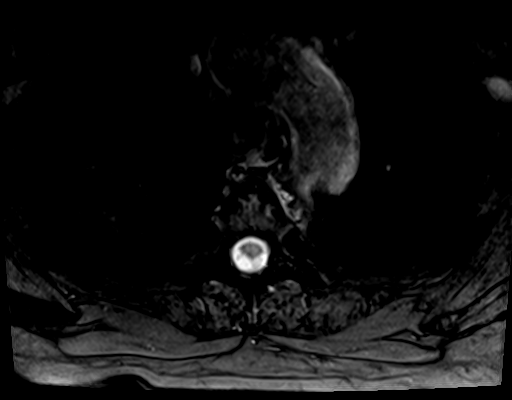
[im 6/44]
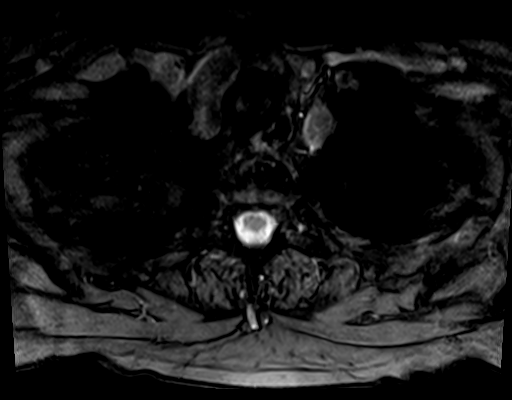
[im 11/44]
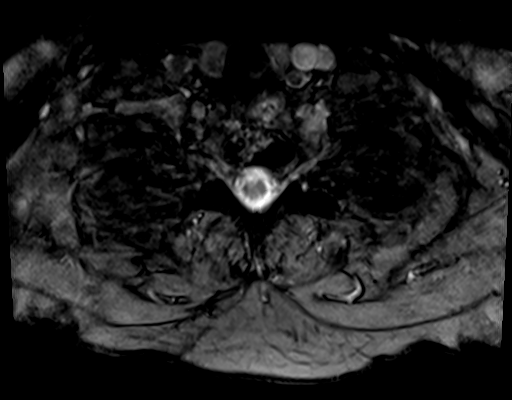
[im 17/44]
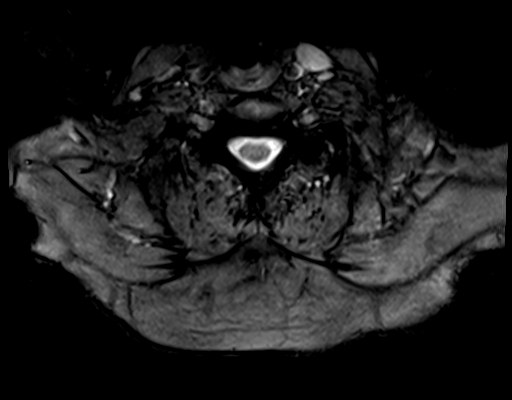
[im 22/44]
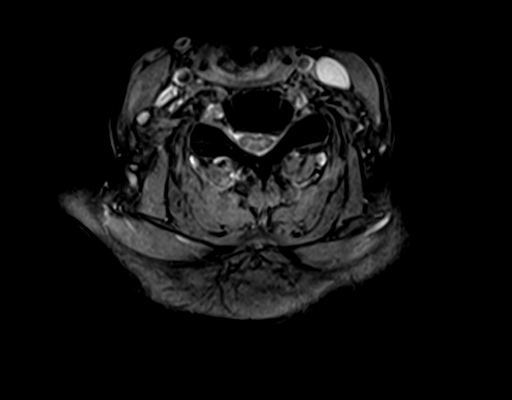
[im 27/44]
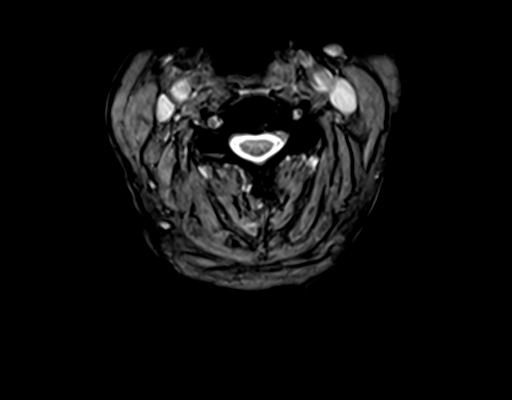
[im 33/44]
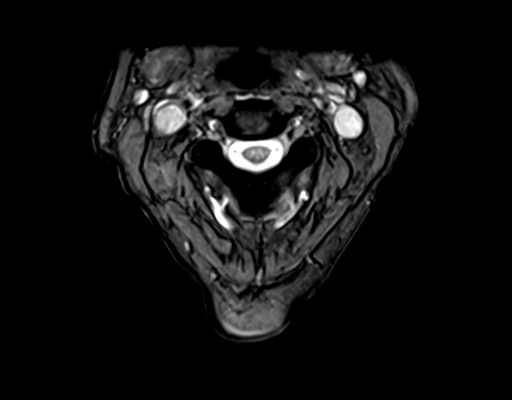
[im 38/44]
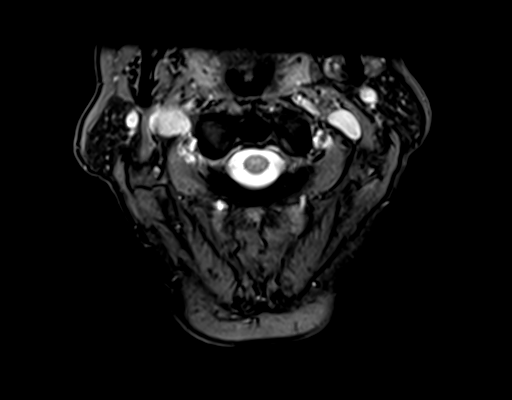
[im 44/44]
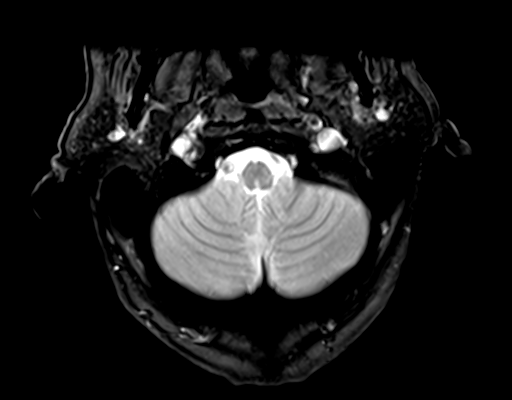

[Series 7: T2 post-contrast · axial · 3.0mm · 0.62mm/px · z∈[-92,+60]mm · 9 of 44 slices shown]
[im 1/44]
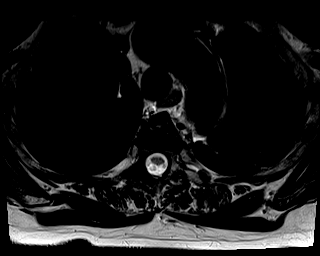
[im 6/44]
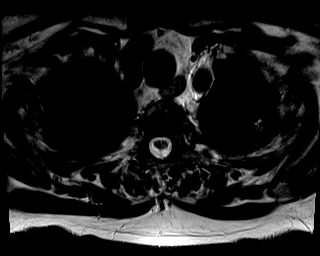
[im 11/44]
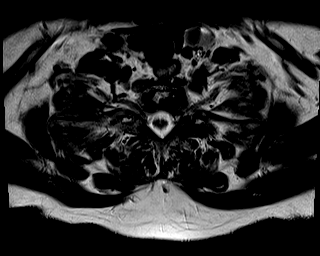
[im 17/44]
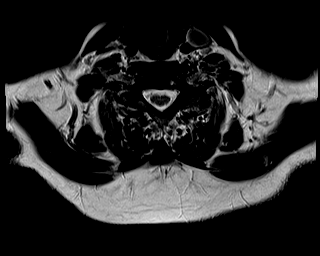
[im 22/44]
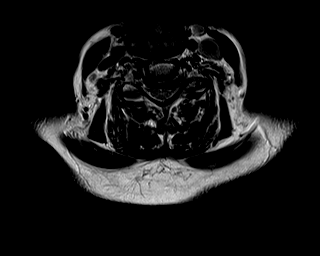
[im 27/44]
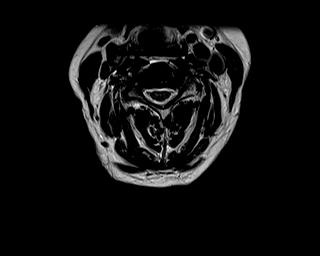
[im 33/44]
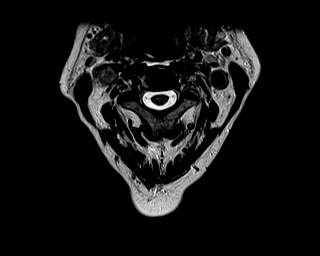
[im 38/44]
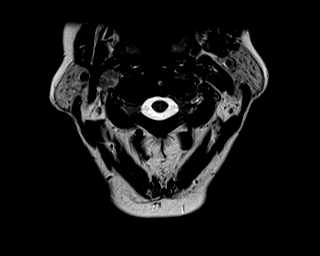
[im 44/44]
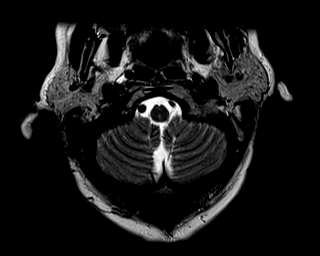

[Series 8: T1 fat-sat post-contrast · sagittal · 3.0mm · 0.41mm/px · 2 of 13 slices shown]
[im 1/13]
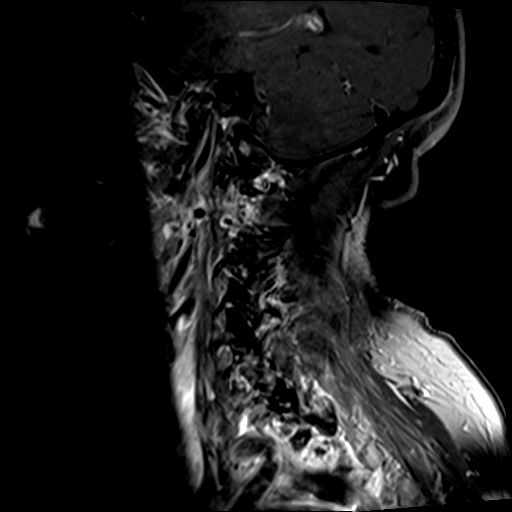
[im 7/13]
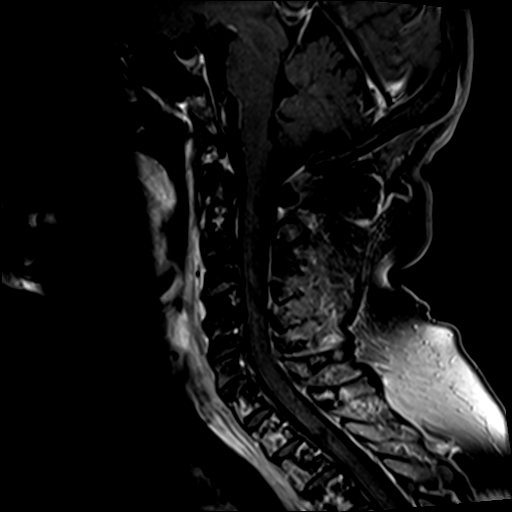

[26 of 48 positions shown; findings below may reference images not displayed]

FINDINGS: Stable diffuse osseous sclerotic bone metastasis involving the
cervical spine, upper thoracic spine and skullbase. Do not see any
significant change. No pathologic fracture or spinal canal
compromise. No abnormal prevertebral soft tissue swelling. The
cervical spinal cord demonstrates normal signal intensity. No cord
lesion or abnormal enhancement.

C2-3:  No significant findings.

C3-4: Bulging and slightly uncovered disc due to mild degenerative
posterior subluxation of C4. There is flattening of the ventral
thecal sac but no foraminal stenosis.

C4-5: Bulging annulus, osteophytic ridging and uncinate spurring.
There is flattening of the ventral thecal sac and narrowing of the
ventral CSF space. Mild foraminal stenosis bilaterally.

C5-6: Bulging annulus and osteophytic ridging and uncinate spurring.
Flattening of the ventral thecal sac and narrowing of the ventral
CSF space. There is a disc osteophyte complex on the left side
contributing to moderate left foraminal stenosis possibly affecting
the left C6 nerve root. This appears relatively stable.

C6-7: Bulging annulus but no significant spinal or foraminal
stenosis.

C7-T1: No significant findings.
IMPRESSION: 1. The stable diffuse osseous metastatic disease but no pathologic
fracture or canal compromise.
2. No cord lesions or abnormal cord enhancement.
3. Stable right-sided enhancing right-sided level 2 lymph node.
4. Stable mild spinal stenosis and mild bilateral foraminal stenosis
at C4-5.
5. Stable left-sided disc osteophyte complex at C5-6 with left
foraminal stenosis.

## 2017-12-19 IMAGING — DX DG THORACIC SPINE 4+V
3 series · 3 of 3 positions shown · non-contrast
Comparison: Chest radiographs 11/04/2015.  PET-CT 11/18/2015.

CLINICAL DATA: Mid back pain for a few months. No known injury.
Metastatic lung cancer.

EXAM:
THORACIC SPINE - 4+ VIEW

[t-spine ap]
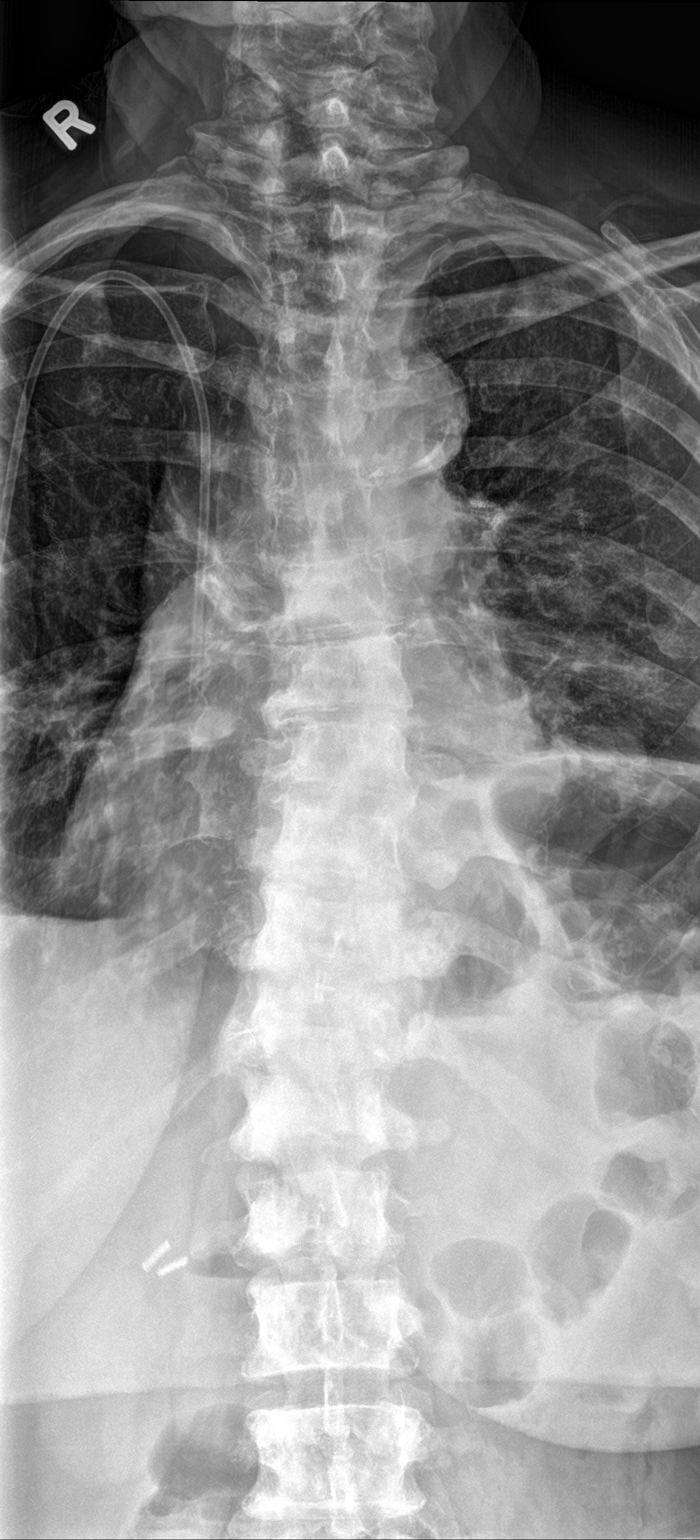

[t-spine lat]
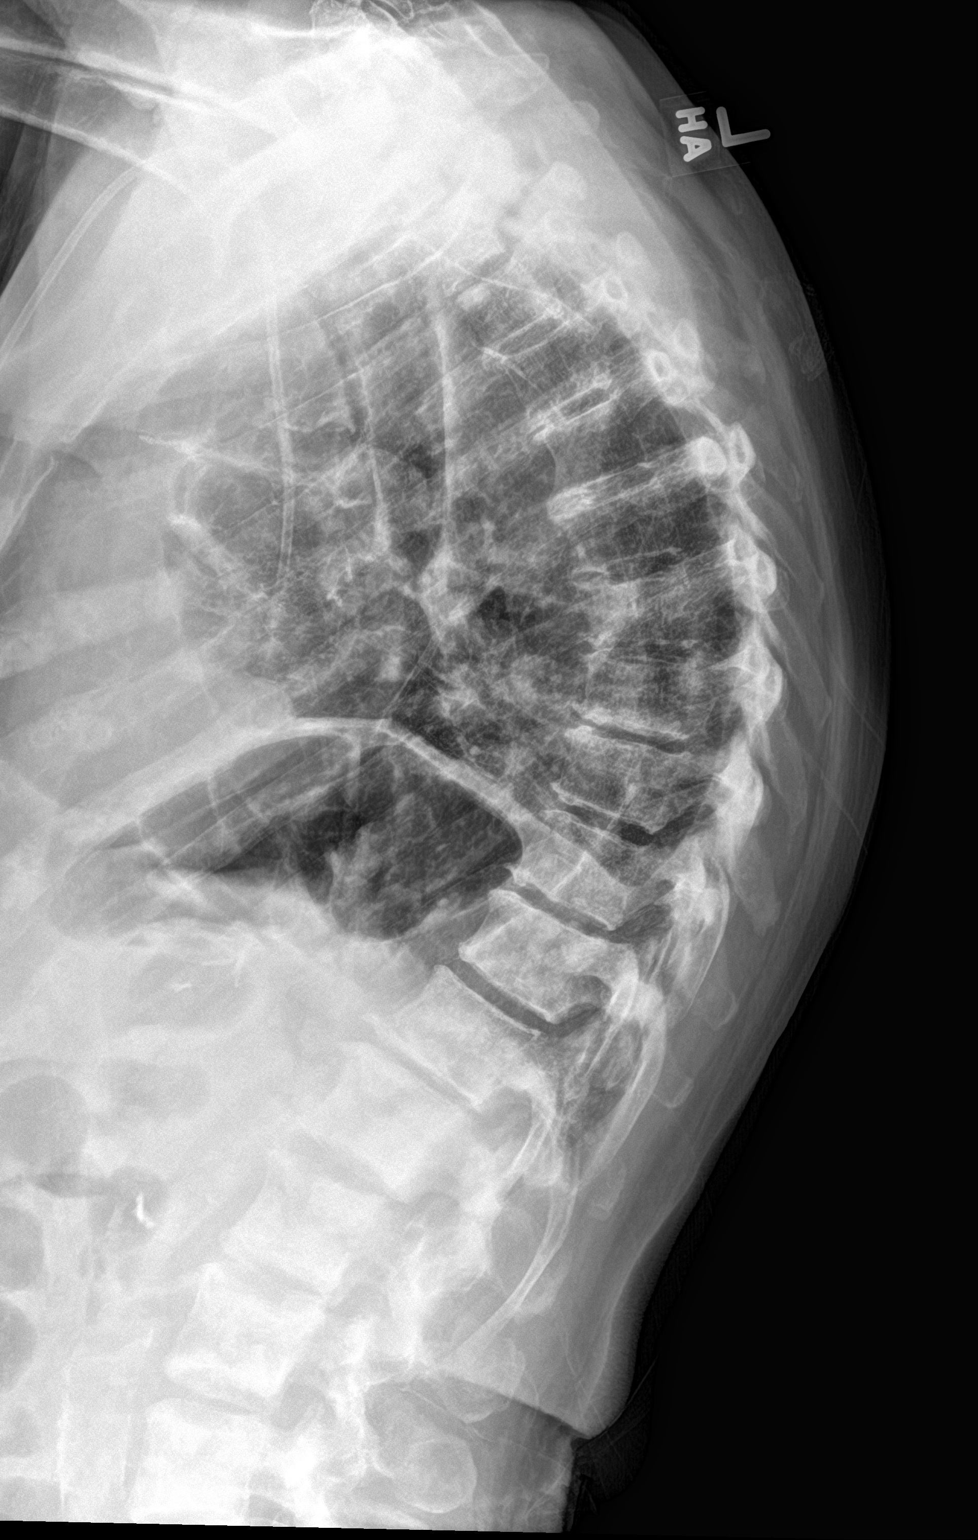

[t-spine swimmers]
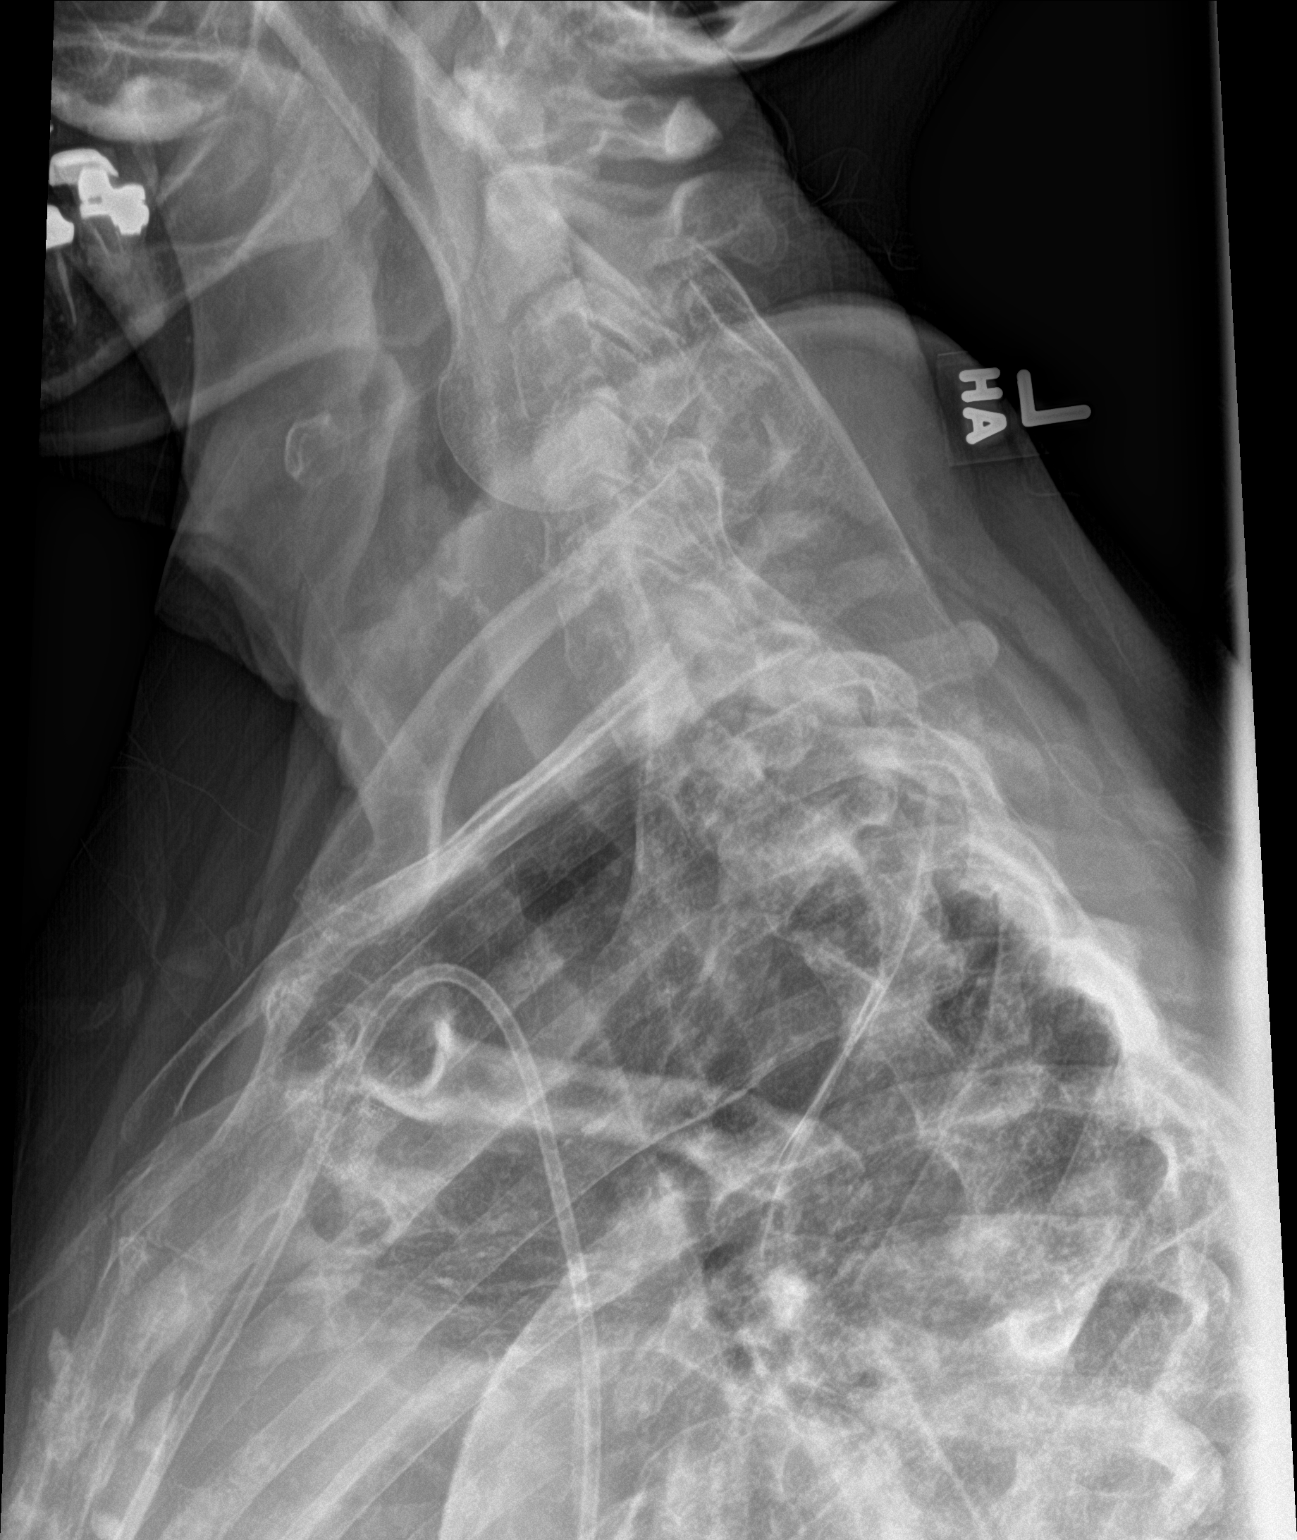

[3 of 3 positions shown; findings below may reference images not displayed]

FINDINGS: Widespread blastic metastatic disease throughout the visualized
vertebra and ribs, similar to previous studies. No pathologic
fracture identified. There are degenerative changes throughout the
spine and a mild scoliosis. Right IJ Port-A-Cath tip extends to the
SVC right atrial junction. There is elevation of the left
hemidiaphragm with patchy left basilar opacity, improved from prior
radiographs.
IMPRESSION: Known widespread osseous metastatic disease, grossly stable. No
acute findings identified radiographically.

## 2018-01-27 IMAGING — CT NM PET TUM IMG RESTAG (PS) SKULL BASE T - THIGH
9 series · 25 of 25 positions shown · non-contrast
Comparison: Multiple exams, including 11/18/2015

CLINICAL DATA: Subsequent treatment strategy for metastatic lung
cancer.

EXAM:
NUCLEAR MEDICINE PET SKULL BASE TO THIGH
TECHNIQUE: 6.6 mCi F-18 FDG was injected intravenously. Full-ring PET imaging
was performed from the skull base to thigh after the radiotracer. CT
data was obtained and used for attenuation correction and anatomic
localization.
FASTING BLOOD GLUCOSE:  Value: 86 mg/dl

[Series 3: pet sk_thigh ac · axial · 5.0mm · 4.07mm/px · z∈[-664,+156]mm · 4 of 206 slices shown]
[im 1/206]
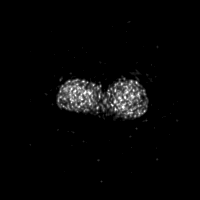
[im 69/206]
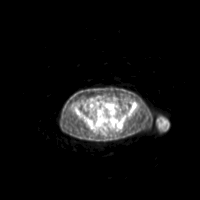
[im 137/206]
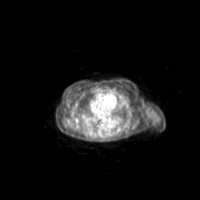
[im 206/206]
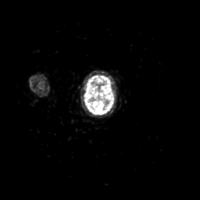

[Series 4: ct sk_thigh 5.0 b31f · axial · 5.0mm · 0.98mm/px · z∈[-664,+156]mm · 4 of 206 slices shown]
[im 1/206]
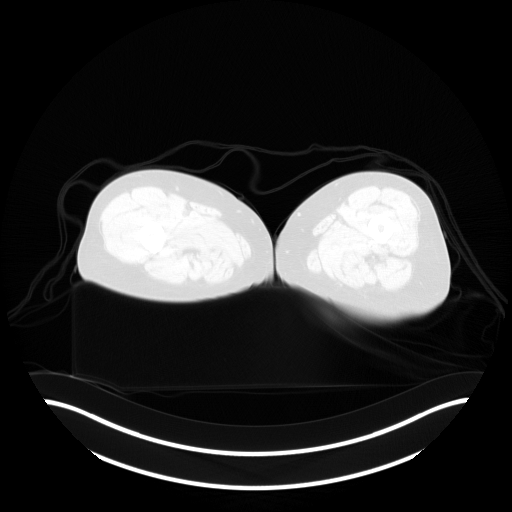
[im 69/206]
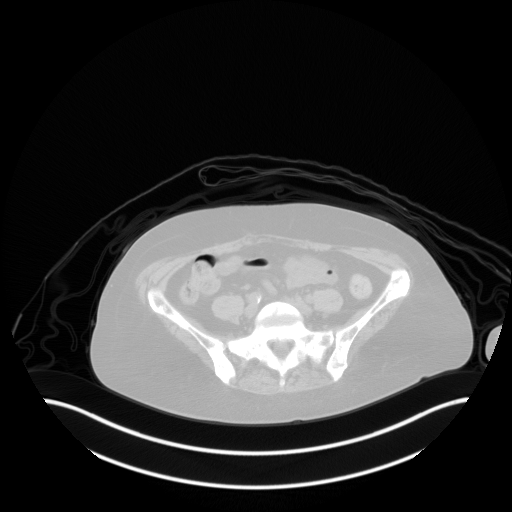
[im 137/206]
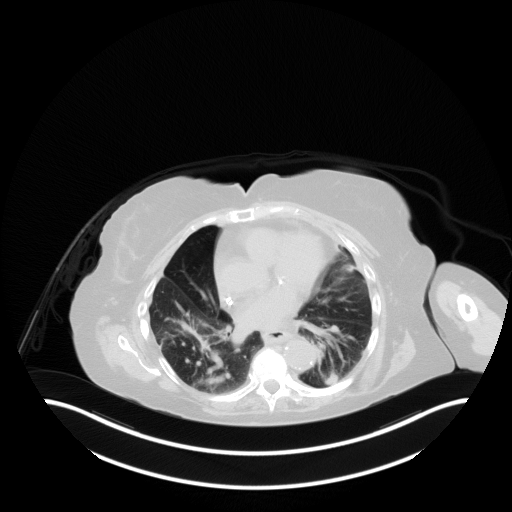
[im 206/206  brain]
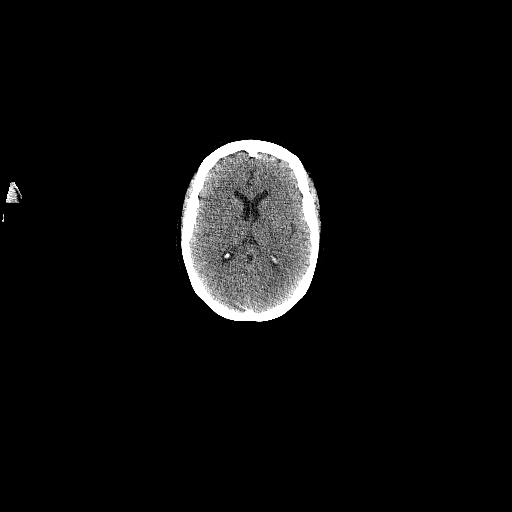

[Series 7: ct sk_thigh 5.0 b70f lung_bone · axial · 5.0mm · 0.59mm/px · z∈[-234,+2]mm · 2 of 60 slices shown]
[im 1/60  bone]
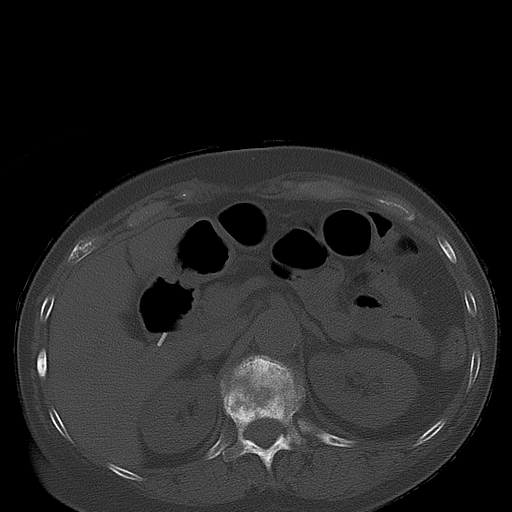
[im 60/60  bone]
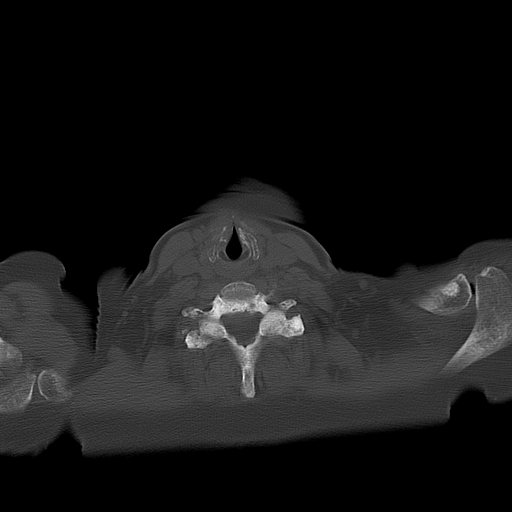

[Series 8: pet sk_thigh nac · axial · 5.0mm · 4.07mm/px · z∈[-664,+156]mm · 5 of 206 slices shown]
[im 1/206]
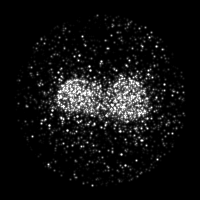
[im 52/206]
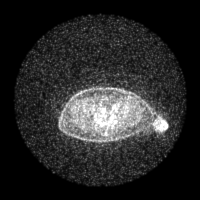
[im 103/206]
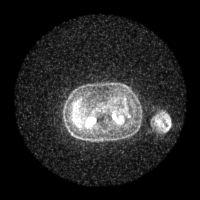
[im 154/206]
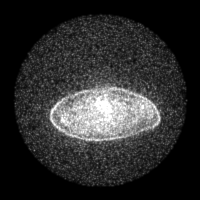
[im 206/206]
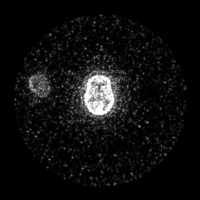

[Series 604: range-ct sk_thigh 5.0 (id)<alpha range> · 2 of 85 slices shown (1 of 2)]
[im 1/85]
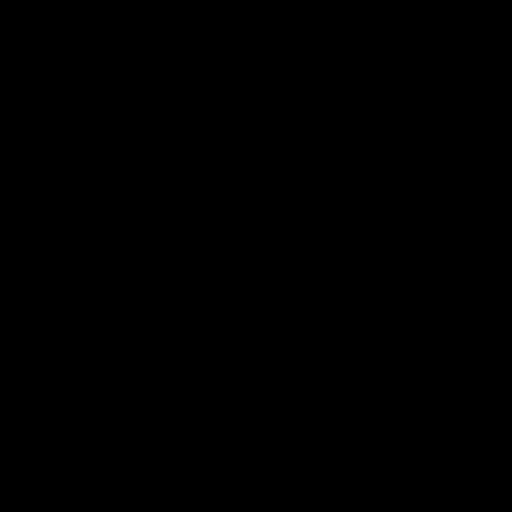
[im 85/85]
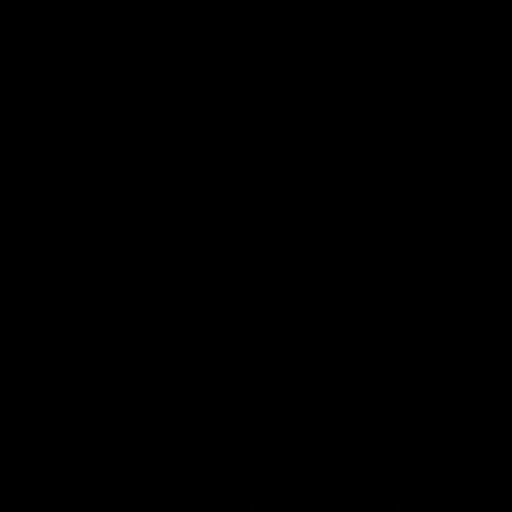

[Series 605: mip collection · coronal · 1.70mm/px · 1 of 32 slices shown]
[im 1/32]
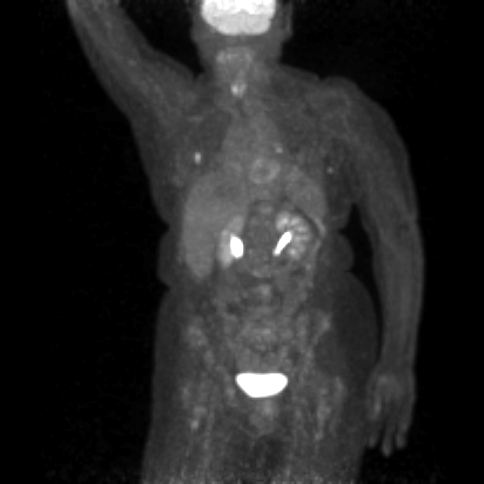

[Series 606: range-ct sk_thigh 5.0 (id)<alpha range> · 5 of 195 slices shown (2 of 2)]
[im 1/195]
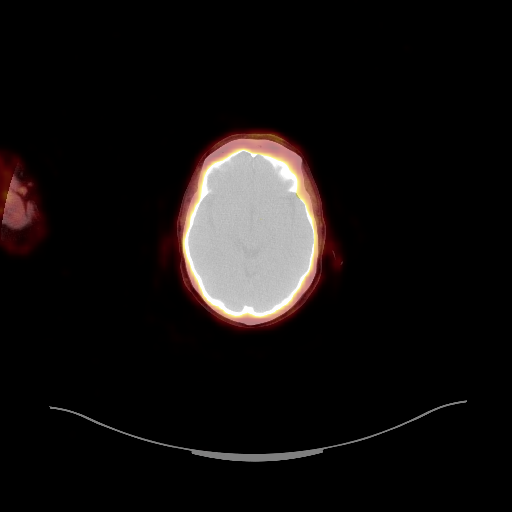
[im 49/195]
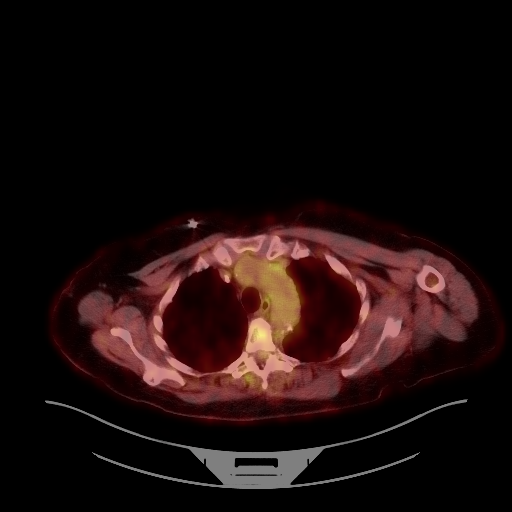
[im 98/195]
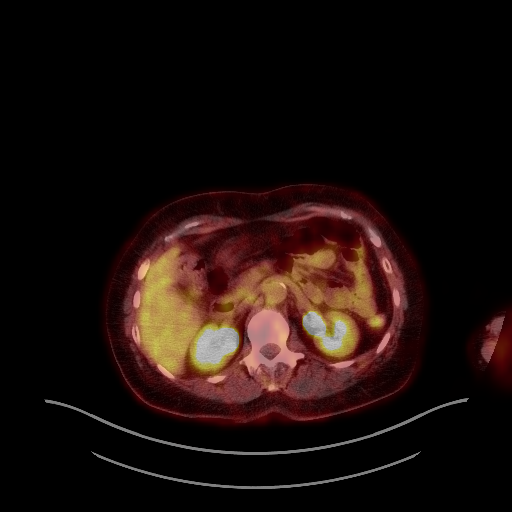
[im 146/195]
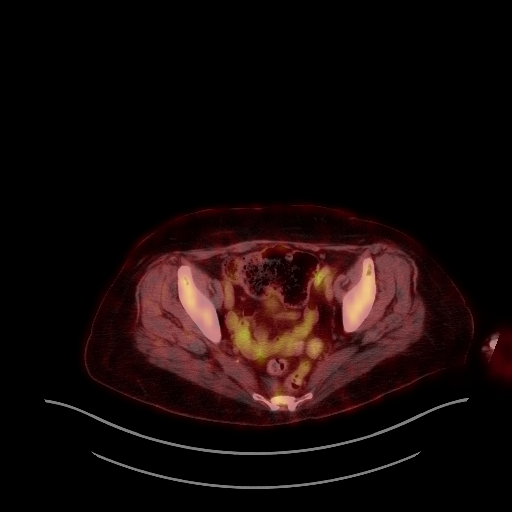
[im 195/195]
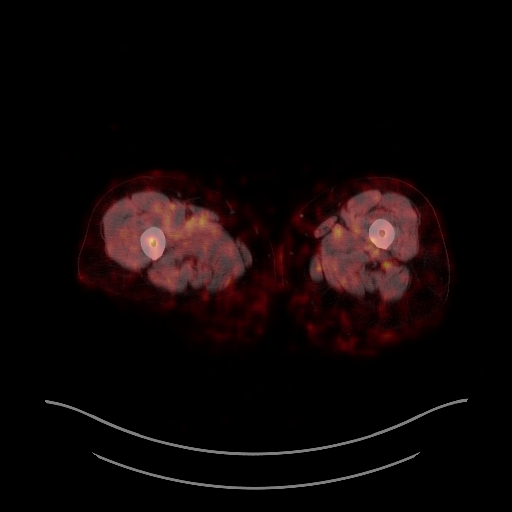

[Series 1696: results mm oncology reading · 5.0mm · 1.00mm/px · 1 of 5 slices shown (1 of 2)]
[im 1/5]
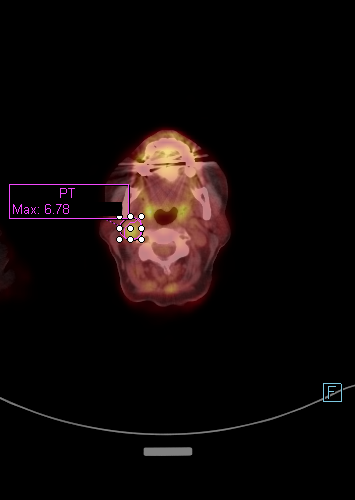

[Series 1773: results mm oncology reading · 5.0mm · 0.79mm/px · 1 of 4 slices shown (2 of 2)]
[im 1/4]
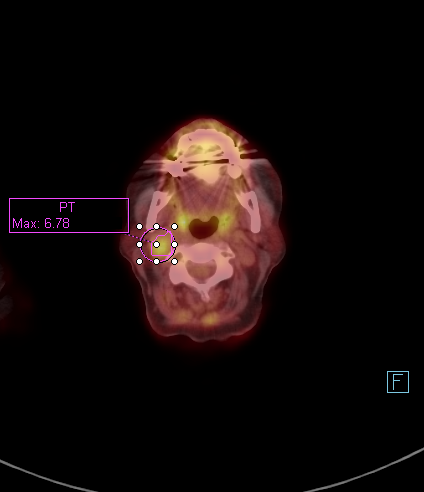

[25 of 25 positions shown; findings below may reference images not displayed]

FINDINGS: NECK

Right station IIa lymph node 1.5 cm in short axis on image [DATE]
(stable size), maximum SUV 6.8 (formerly 6.4). Stable glottic
activity somewhat a asymmetric to the right side posteriorly prior
left supraclavicular node no longer well seen.

CHEST

Peripheral hypermetabolic subpleural nodular opacity 1.4 by 0.8 cm,
slightly less obscured by motion artifact than on the prior exam,
current metabolic activity 10.3 SUV (formerly 4.6).

Left infrahilar node difficult to measure because of lack of
contrast, with SUV 6.9 (formerly 6.7), essentially stable.

There is some faintly hypermetabolic activity associated with what
appears to be atelectatic lung in the lingula. Low-grade
hypermetabolic activity associated with atelectatic lung in the left
lower lobe adjacent to the descending thoracic aorta, this all
appears fairly similar to prior.

A lower axillary lymph node with short axis diameter is 0.7 cm on
image 74/4 (formerly the same) has a maximum SUV of 4.1 (formerly
2.7).

Stable scattered scarring and nodularity in the left upper lobe with
none of the small nodules perceptibly hypermetabolic but most below
sensitive PET-CT size thresholds. Mild nodularity in the superior
segment right lower lobe common not appreciably hypermetabolic.

Small hiatal hernia. Coronary, aortic arch, and branch vessel
atherosclerotic vascular disease.

ABDOMEN/PELVIS

No abnormal hypermetabolic activity within the liver, pancreas,
adrenal glands, or spleen. No hypermetabolic lymph nodes in the
abdomen or pelvis.

Aortoiliac atherosclerotic vascular disease.  Cholecystectomy.

SKELETON

Stable appearance of diffuse sclerotic metastatic disease but with
only faint scattered hypermetabolic activity there is similar in
pattern in appearance to the prior exam. Index lesion in the right
sacrum just to the right of midline has maximum SUV 6.8 (formerly
6.9). No new bony lesions observed.
IMPRESSION: 1. There is a peripheral hypermetabolic subpleural nodular opacity
in the right lower lobe with maximum SUV of 10.3 (formerly 4.6).
However, prior SUV [DATE] have been spuriously reduced due to motion
artifact. There is also a mildly increased hypermetabolic activity
in the left lower axillary lymph node.
2. Other sites of hypermetabolic activity including a right station
IIa lymph node in the neck, a left infrahilar node, and the bony
metastatic disease appear stable.
3. Scattered mild nodularity in the lungs with scattered scarring
and rounded atelectasis, not changed.
4. Other imaging findings of potential clinical significance: Hiatal
hernia. Coronary, aortic arch, and branch vessel atherosclerotic
vascular disease. Aortoiliac atherosclerotic vascular disease.

## 2018-02-27 IMAGING — CR DG CHEST 2V
2 series · 2 of 2 positions shown · non-contrast
Comparison: 03/14/2016 and 11/04/2015

CLINICAL DATA: Chest congestion for 2 weeks, sinus drainage,
history of metastatic lung cancer

EXAM:
CHEST  2 VIEW

[w chest pa]
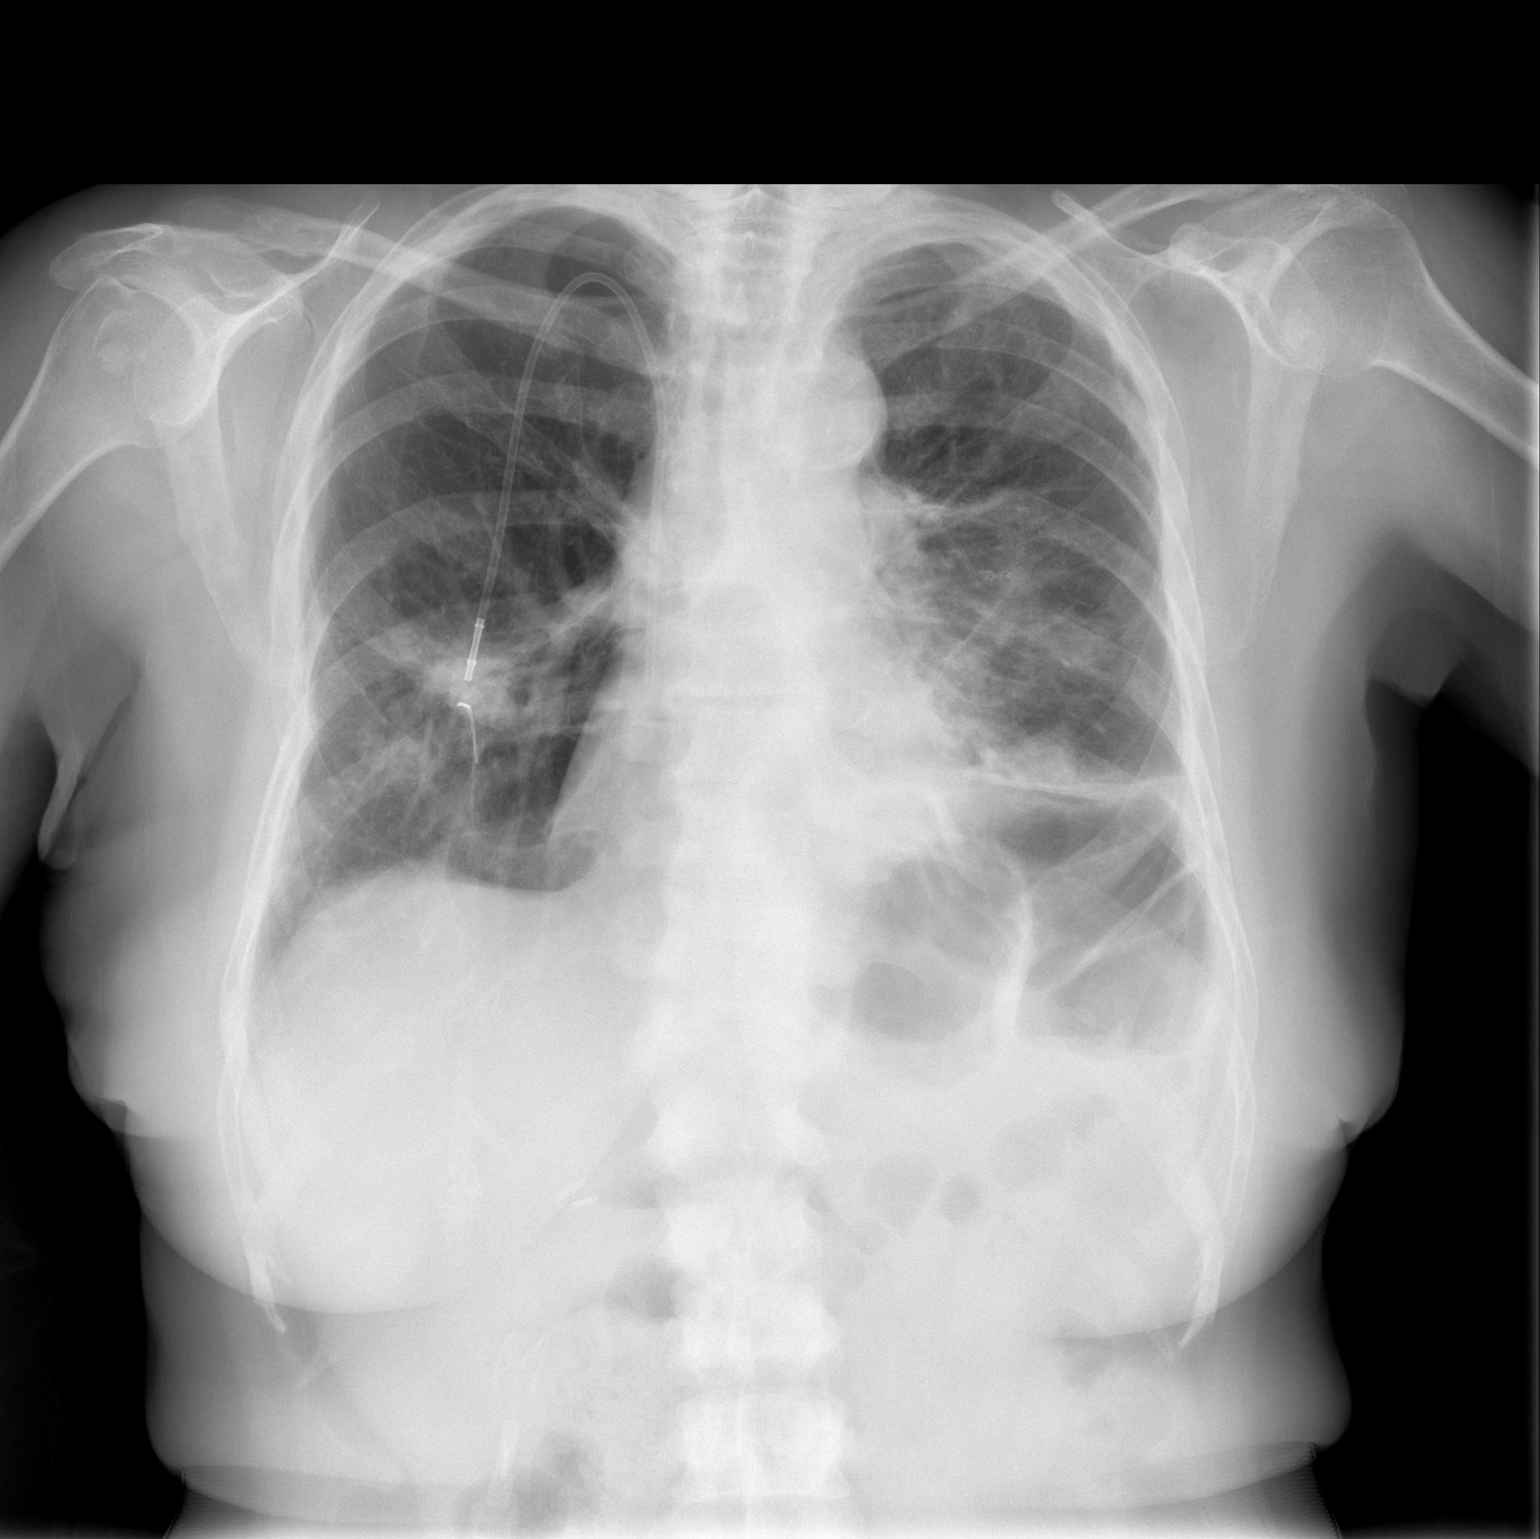

[w chest lat]
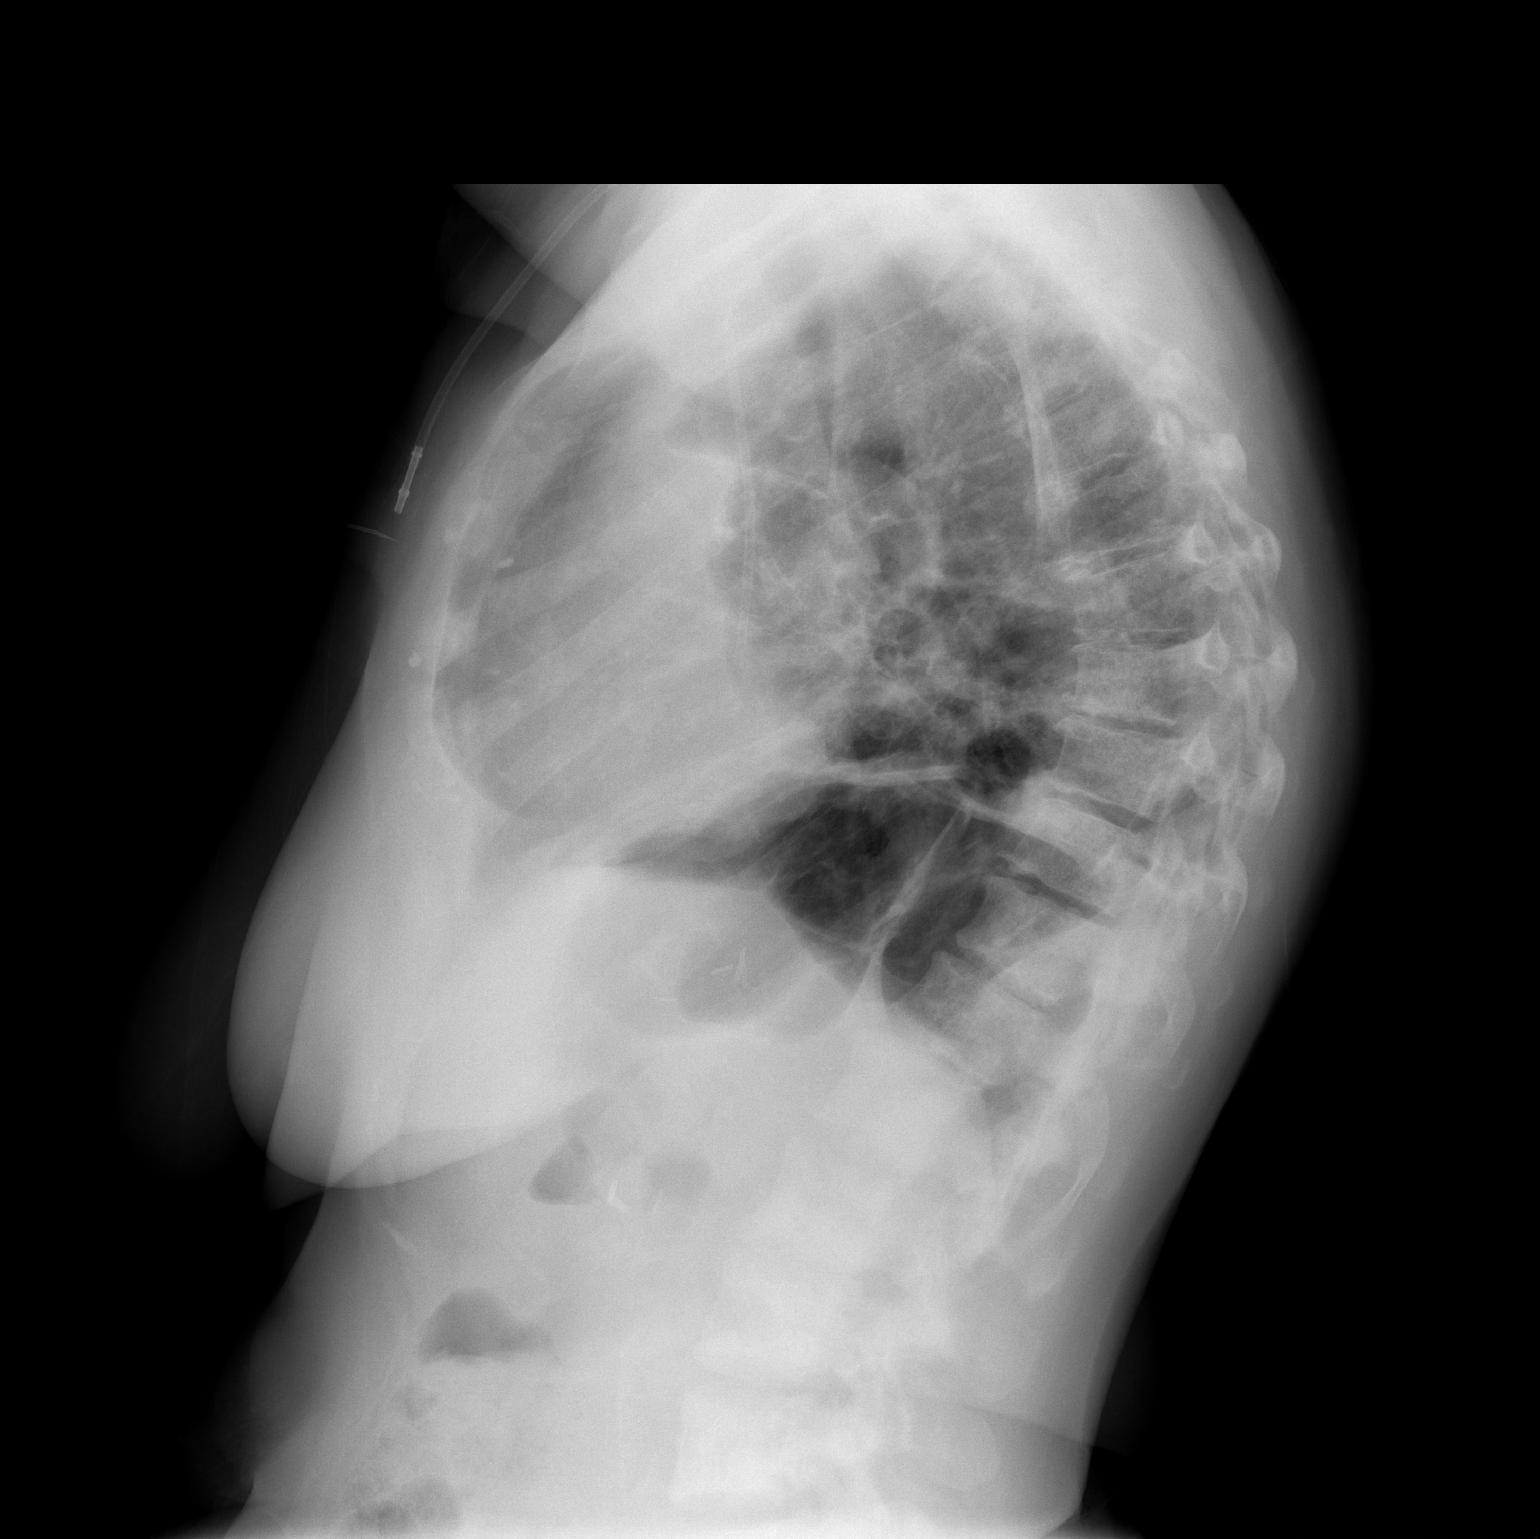

[2 of 2 positions shown; findings below may reference images not displayed]

FINDINGS: Cardiomediastinal silhouette is stable. Right IJ Port-A-Cath is
unchanged in position. Again noted elevation of the left
hemidiaphragm. Stable triangular consolidation right lower lobe
infrahilar region. Stable left basilar patchy airspace disease. This
are consistent with stable metastatic disease. Slight worsening in
aeration left lingula Superimposed atypical infiltrate/pneumonia
cannot be excluded. Clinical correlation is necessary.
IMPRESSION: Again noted elevation of the left hemidiaphragm. Stable triangular
consolidation right lower lobe infrahilar region. Stable left
basilar patchy airspace disease. This are consistent with stable
metastatic disease. Slight worsening in aeration left lingula
Superimposed atypical infiltrate/pneumonia cannot be excluded.
Clinical correlation is necessary.

## 2018-04-26 IMAGING — MR MR CERVICAL SPINE WO/W CM
5 of 8 series · 25 of 48 positions shown · IV contrast (multihance)
Comparison: PET-CT 03/14/2016.  Cervical MRI 12/12/2015.

CLINICAL DATA: 65-year-old with neck pain radiating into the left
shoulder and arm. Symptoms for 6 weeks. History of metastatic lung
cancer. No recent injury or prior relevant surgery.

EXAM:
MRI CERVICAL SPINE WITHOUT AND WITH CONTRAST
TECHNIQUE: Multiplanar and multiecho pulse sequences of the cervical spine, to
include the craniocervical junction and cervicothoracic junction,
were obtained without and with intravenous contrast.
CONTRAST:  12mL MULTIHANCE GADOBENATE DIMEGLUMINE 529 MG/ML IV SOLN

[Series 2: T1 · sagittal · 3.0mm · 0.41mm/px · 3 of 13 slices shown]
[im 1/13]
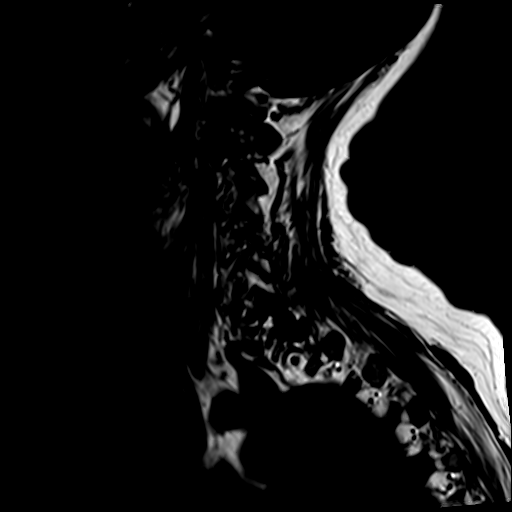
[im 7/13]
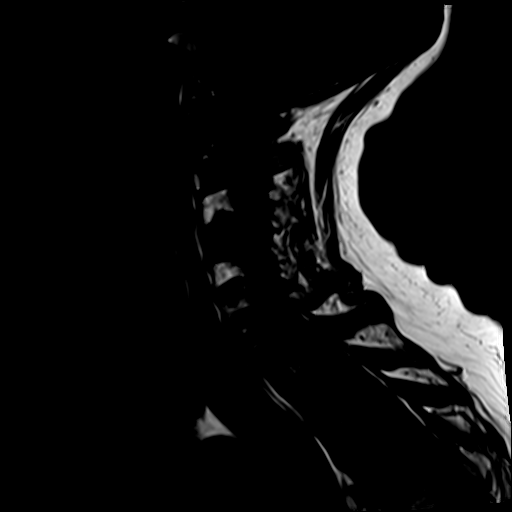
[im 13/13]
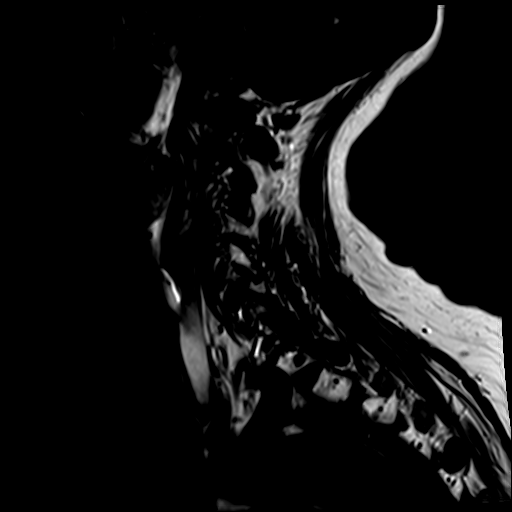

[Series 4: T2 · axial · 3.0mm · 0.39mm/px · z∈[-67,+42]mm · 9 of 34 slices shown (1 of 2)]
[im 1/34]
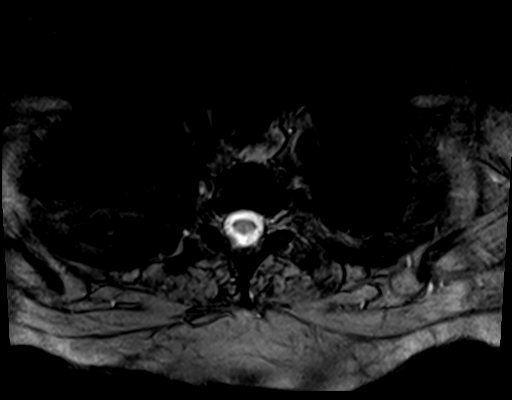
[im 5/34]
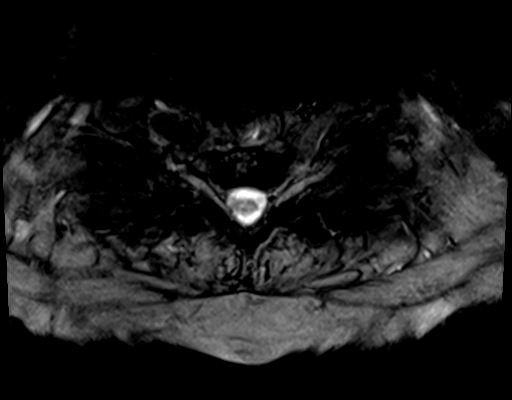
[im 9/34]
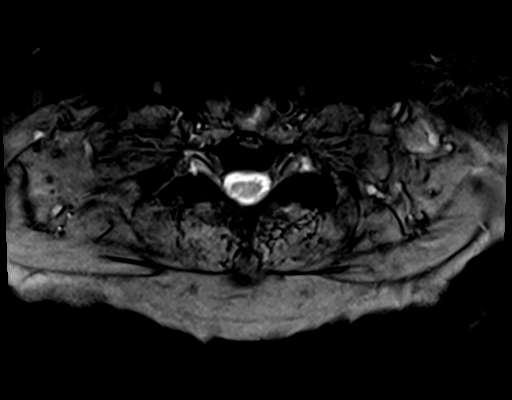
[im 13/34]
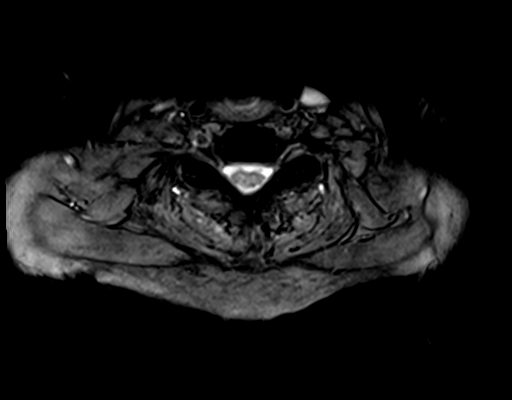
[im 17/34]
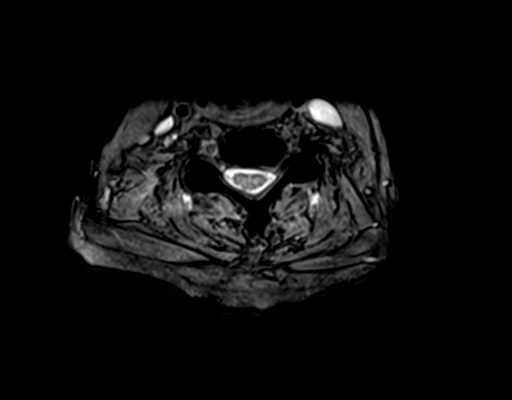
[im 21/34]
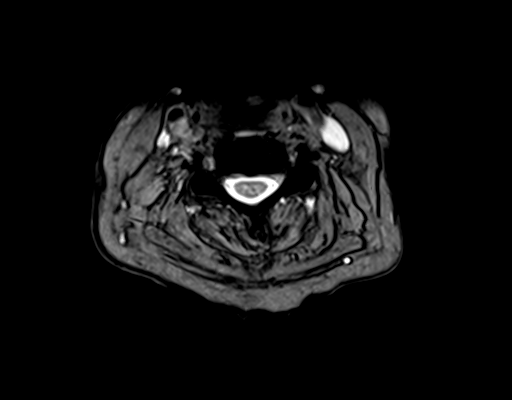
[im 25/34]
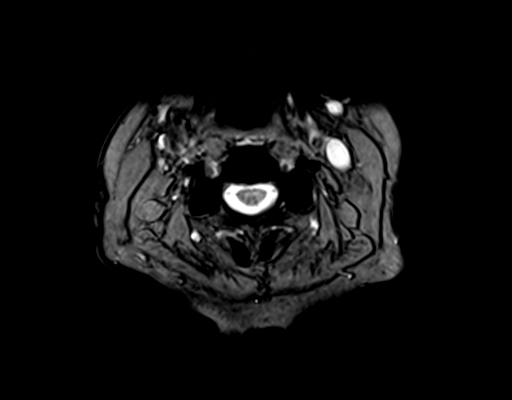
[im 29/34]
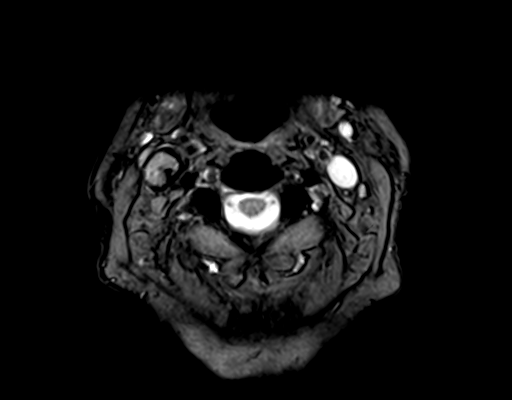
[im 34/34]
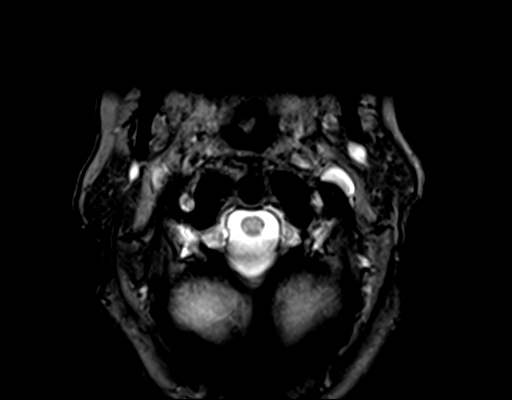

[Series 5: T2 · axial · 3.0mm · 0.62mm/px · z∈[-72,+36]mm · 9 of 34 slices shown (2 of 2)]
[im 1/34]
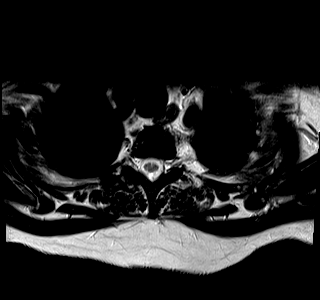
[im 5/34]
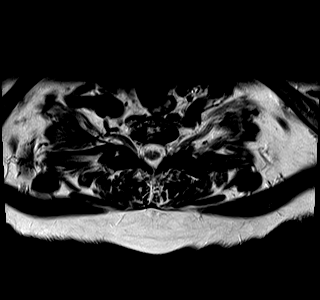
[im 9/34]
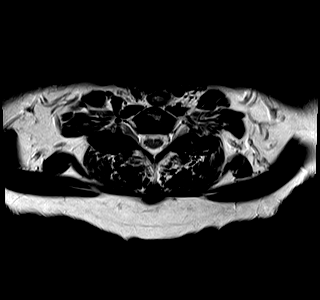
[im 13/34]
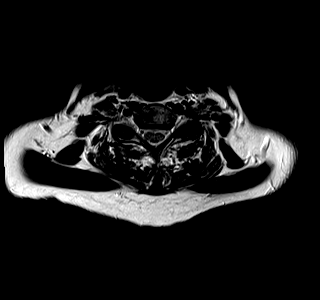
[im 17/34]
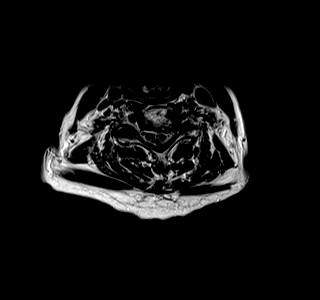
[im 21/34]
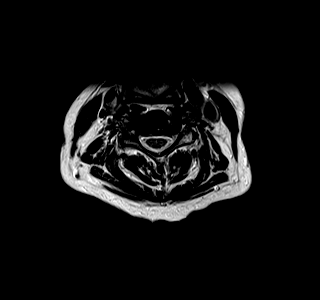
[im 25/34]
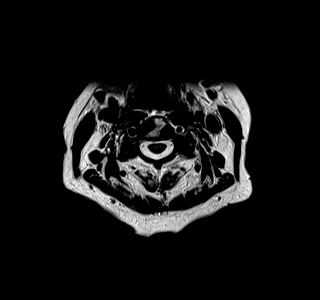
[im 29/34]
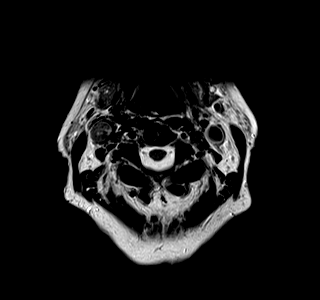
[im 34/34]
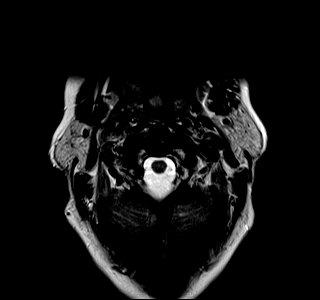

[Series 7: T2 post-contrast · sagittal · 3.0mm · 0.41mm/px · 3 of 13 slices shown]
[im 1/13]
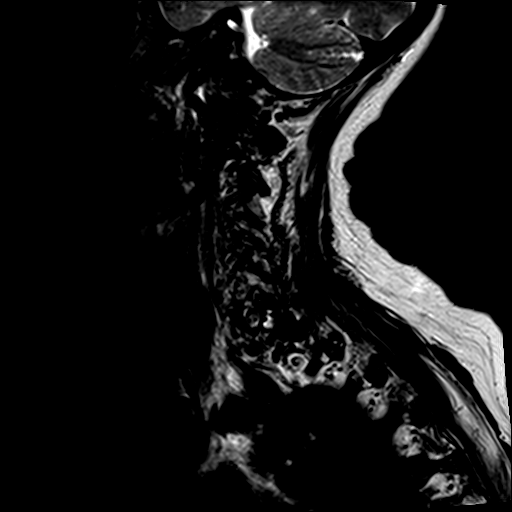
[im 7/13]
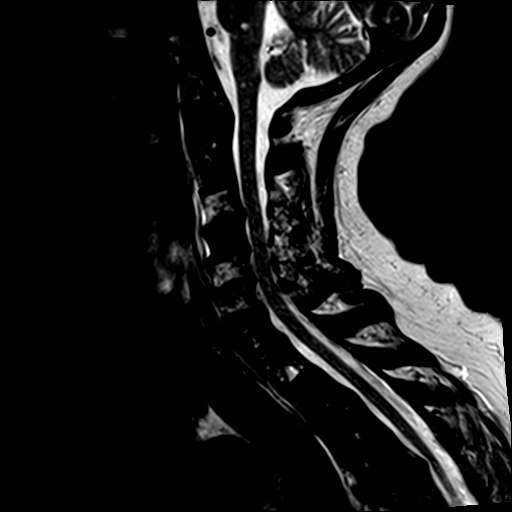
[im 13/13]
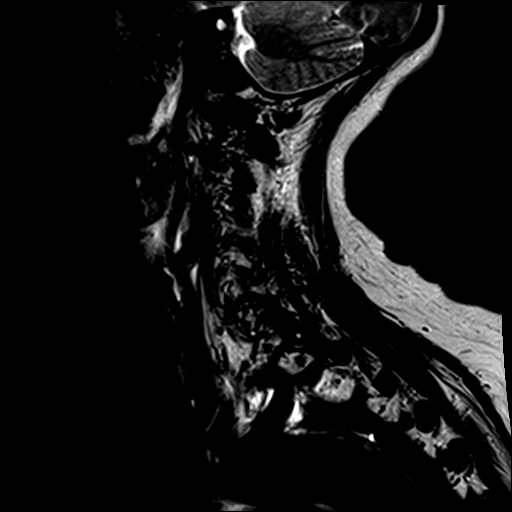

[Series 8: T1 fat-sat post-contrast · sagittal · 3.0mm · 0.41mm/px · 1 of 13 slices shown]
[im 1/13]
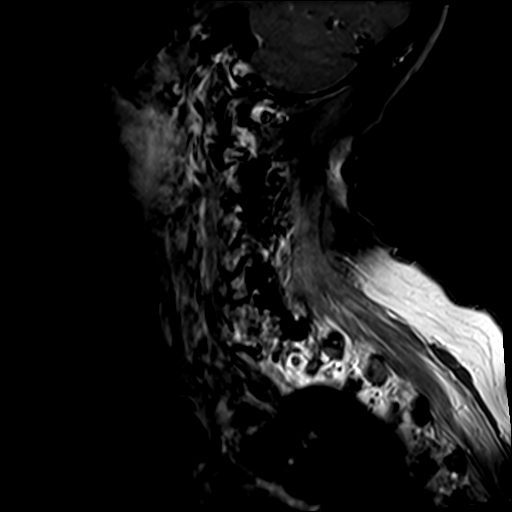

[25 of 48 positions shown; findings below may reference images not displayed]

FINDINGS: Alignment: Stable and near anatomic.

Vertebrae: Again demonstrated is widespread blastic metastatic
disease. This appears very similar in distribution to the prior
examination. Scattered small areas of T2 hyperintensity and marrow
enhancement are present. No evidence of pathologic fracture or
epidural tumor.

Cord: Normal in caliber. There are scattered foci of T2
hyperintensity within the brainstem and cervical cord which appear
unchanged. No cord edema or abnormal enhancement seen.

Posterior Fossa, vertebral arteries, paraspinal tissues: As above,
there are small foci of T2 hyperintensity within the brainstem which
appears stable. No abnormal intracranial enhancement is seen.
Bilateral vertebral artery flow voids. As seen on multiple prior
studies, there is a persistent enhancing mass in the upper right
neck, lateral to the carotid sheath at the C2 level. This measures
16 x 17 mm transverse and appears unchanged.

Disc levels:

No significant disc space findings at or above C3-4.

C4-5: There is spondylosis with posterior osteophytes covering
diffusely bulging disc material. There is uncinate spurring
bilaterally contributing to moderate foraminal narrowing. No cord
deformity.

C5-6: There is spondylosis with posterior osteophytes and asymmetric
uncinate spurring on the left. There is moderate left foraminal
narrowing. No cord deformity.

C6-7: Mild disc bulging and uncinate spurring. No cord deformity or
significant foraminal compromise.

C7-T1: No significant findings.
IMPRESSION: 1. No acute findings or significant changes are seen compared with
the patient's prior studies.
2. There is long-standing multifocal osseous metastatic disease
without apparent change in distribution.
3. Grossly stable spondylosis, greatest at the C4-5 and C5-6 levels.
There is left-sided foraminal narrowing at those levels due to
uncinate spurring which may contribute to the patient's symptoms.
4. Stable enhancing nodule in the upper right neck, previously
called a lymph node. Given the relative stability, consider
alternative etiology such as paraganglioma.

## 2018-05-20 IMAGING — CT NM PET TUM IMG RESTAG (PS) SKULL BASE T - THIGH
8 series · 25 of 25 positions shown · non-contrast
Comparison: Multiple exams, including 03/14/2016

CLINICAL DATA: Subsequent Treatment strategy for lung cancer.

EXAM:
NUCLEAR MEDICINE PET SKULL BASE TO THIGH
TECHNIQUE: 6.7 mCi F-18 FDG was injected intravenously. Full-ring PET imaging
was performed from the skull base to thigh after the radiotracer. CT
data was obtained and used for attenuation correction and anatomic
localization.
FASTING BLOOD GLUCOSE:  Value: 88 mg/dl

[Series 3: pet sk_thigh ac · axial · 5.0mm · 4.07mm/px · z∈[-868,-76]mm · 5 of 199 slices shown]
[im 1/199]
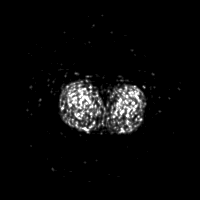
[im 50/199]
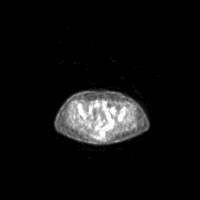
[im 100/199]
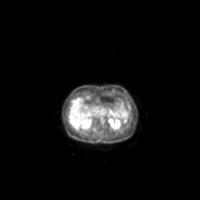
[im 149/199]
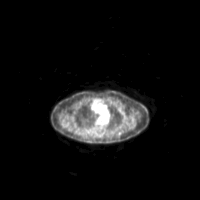
[im 199/199]
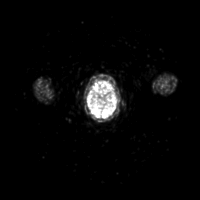

[Series 4: ct sk_thigh 5.0 b31f · axial · 5.0mm · 0.98mm/px · z∈[-868,-76]mm · 5 of 197 slices shown]
[im 1/197]
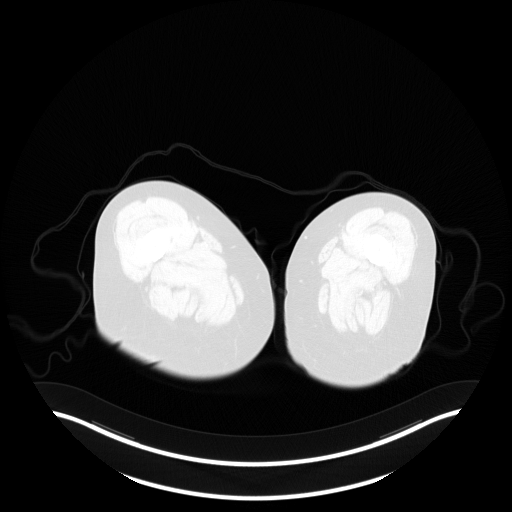
[im 50/197]
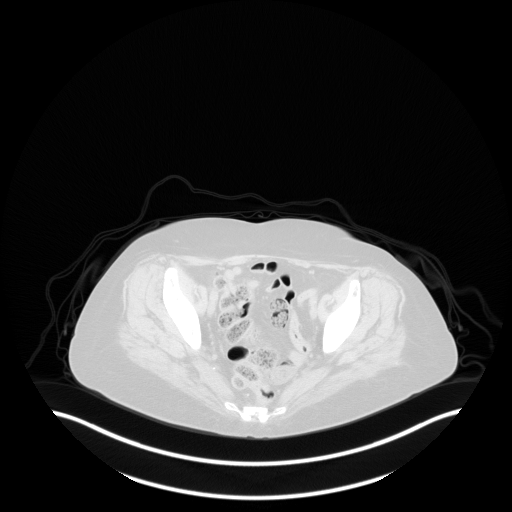
[im 99/197]
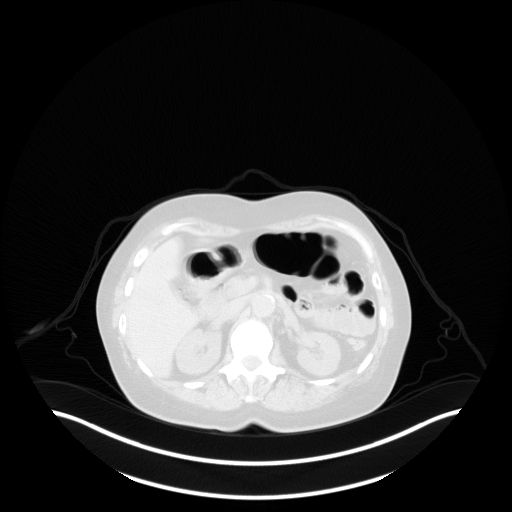
[im 148/197]
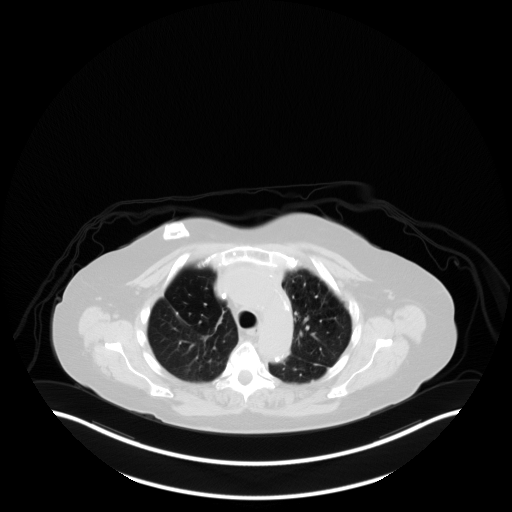
[im 197/197  brain]
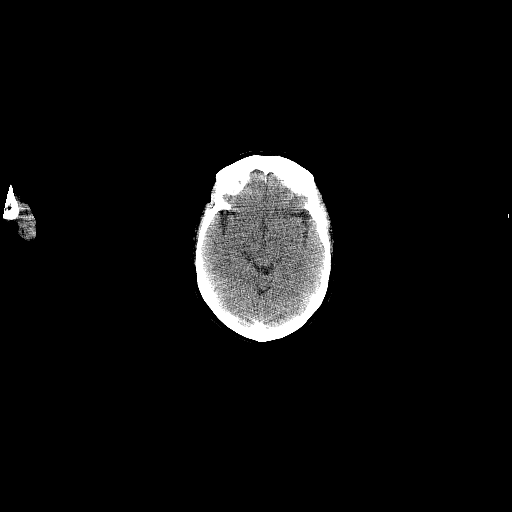

[Series 7: pet sk_thigh nac · axial · 5.0mm · 4.07mm/px · z∈[-868,-76]mm · 5 of 199 slices shown]
[im 1/199]
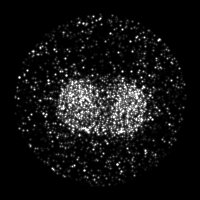
[im 50/199]
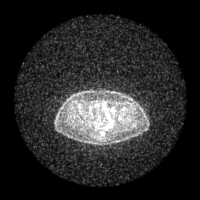
[im 100/199]
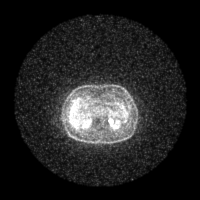
[im 149/199]
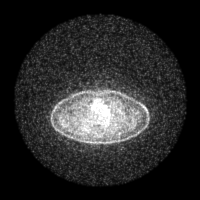
[im 199/199]
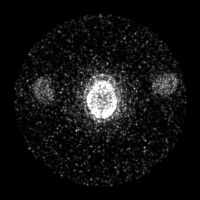

[Series 8: ct sk_thigh 5.0 b70f lung_bone · axial · 5.0mm · 0.58mm/px · 1 of 56 slices shown]
[im 1/56  bone]
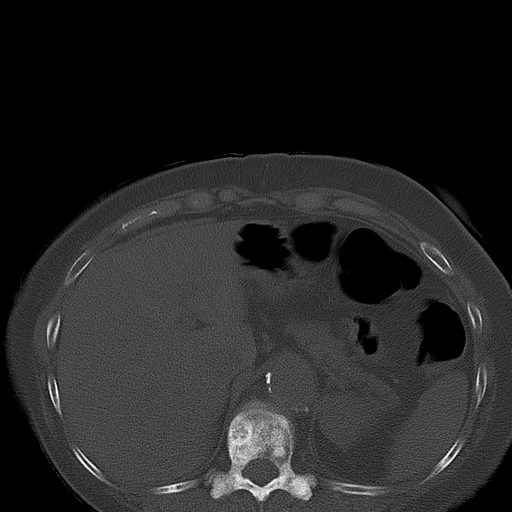

[Series 604: range-ct sk_thigh 5.0 (id)<alpha range> · 2 of 84 slices shown (1 of 2)]
[im 1/84]
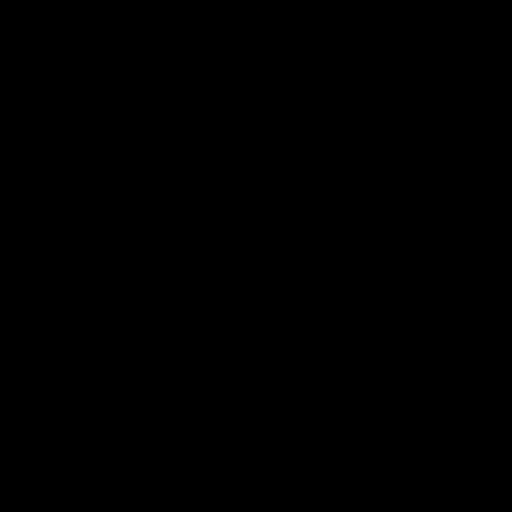
[im 84/84]
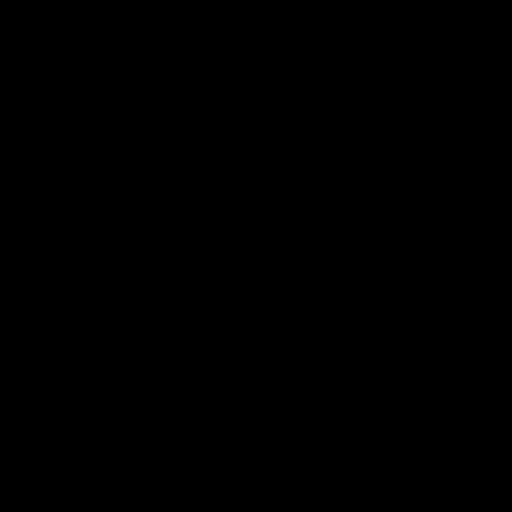

[Series 605: mip collection · coronal · 1.68mm/px · 1 of 32 slices shown]
[im 1/32]
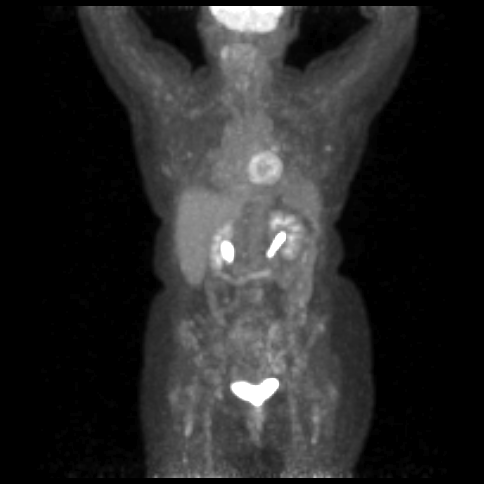

[Series 606: range-ct sk_thigh 5.0 (id)<alpha range> · 5 of 193 slices shown (2 of 2)]
[im 1/193]
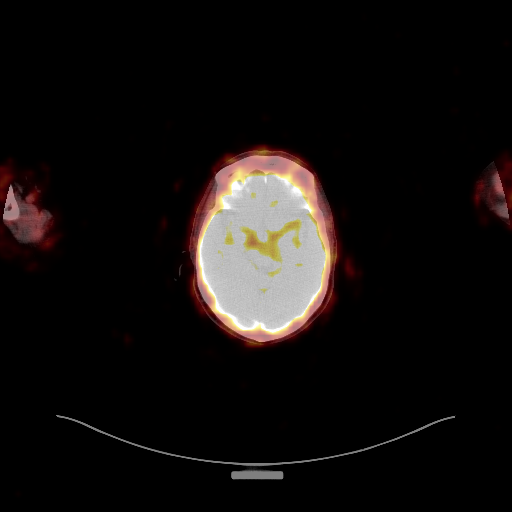
[im 49/193]
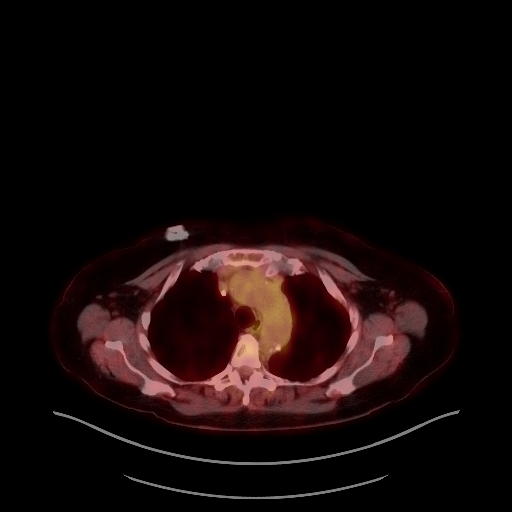
[im 97/193]
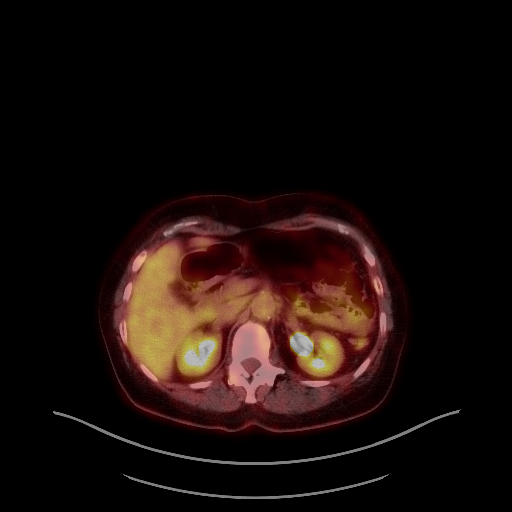
[im 145/193]
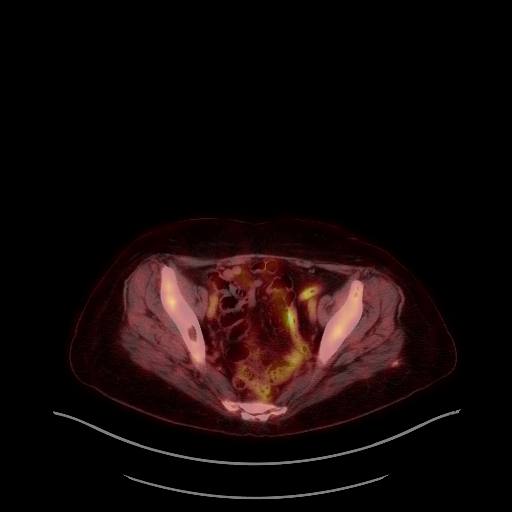
[im 193/193]
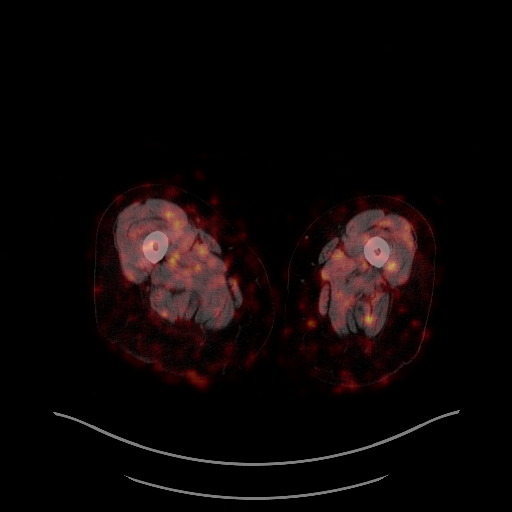

[Series 1772: results mm oncology reading · 5.0mm · 0.79mm/px · 1 of 5 slices shown]
[im 1/5]
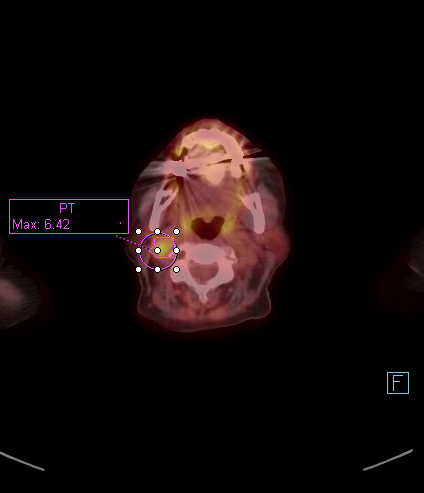

[25 of 25 positions shown; findings below may reference images not displayed]

FINDINGS: NECK

Indistinct right station IIa lymph node has a maximum SUV of 6.4,
previously 6.8. No other significant accentuated activity in the
neck.

CHEST

A left axillary node measures 0.9 cm in short axis on image 61/4 and
has maximum SUV of 3.1, formerly 4.1. A right subpleural nodule
measures 0.9 by 0.6 cm (formerly 1.2 by 1.6 cm) on image 31/ 8 and
has a maximum SUV of 2.9 (formerly 10.3). In indistinct left hilar
node has a maximum SUV of 5.5 (formerly 6.9).

Distal esophageal activity is probably physiologic. Small hiatal
hernia noted.

Stable right infrahilar airspace opacity. Stable volume loss along
the left hemidiaphragm. Postoperative findings in the lingula. Prior
right upper lobectomy.

ABDOMEN/PELVIS

No abnormal hypermetabolic activity within the liver, pancreas,
adrenal glands, or spleen. No hypermetabolic lymph nodes in the
abdomen or pelvis.

Cholecystectomy.  Aortoiliac atherosclerotic vascular disease.

SKELETON

Stable appearance of extensive than widespread osseous metastatic
disease which is associated with some faint marrow heterogeneity.
The index lesion in the right sacrum previously had a maximum SUV of
6.8 in the same region today has a maximum SUV of 3.6.
IMPRESSION: 1. Generally improved metabolic activity in the involved lymph
nodes, pulmonary nodules, and bony metastatic disease in the neck,
chest, abdomen, and pelvis as detailed above.
2. Other imaging findings of potential clinical significance: Hiatal
hernia. Aortic Atherosclerosis ([id number]). Coronary
atherosclerosis. Right upper lobectomy and postoperative findings in
the lingula.

## 2018-06-21 IMAGING — MR MR LUMBAR SPINE WO/W CM
7 series · 48 of 48 positions shown · IV contrast (multihance)
Comparison: 04/26/2013 MRI.  Multiple PET scans.

CLINICAL DATA: Low back pain and bilateral leg pain, progressively
worsening. History of lung cancer with bone metastases. Undergoing
chemotherapy.

EXAM:
MRI LUMBAR SPINE WITHOUT AND WITH CONTRAST
TECHNIQUE: Multiplanar and multiecho pulse sequences of the lumbar spine were
obtained without and with intravenous contrast.
CONTRAST:  10mL MULTIHANCE GADOBENATE DIMEGLUMINE 529 MG/ML IV SOLN

[Series 2: T1 · sagittal · 4.0mm · 0.81mm/px · 3 of 14 slices shown (1 of 2)]
[im 1/14]
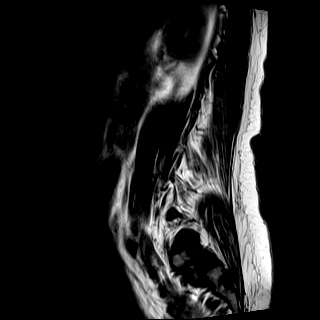
[im 7/14]
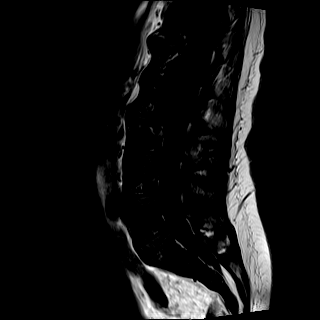
[im 14/14]
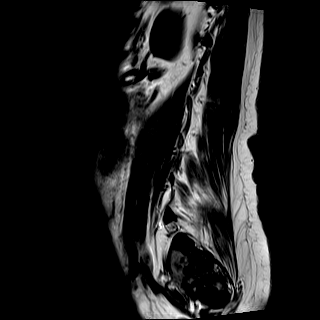

[Series 3: T2 · sagittal · 4.0mm · 0.81mm/px · 4 of 14 slices shown]
[im 1/14]
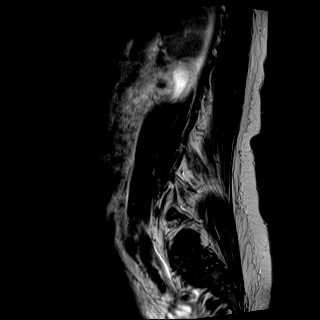
[im 5/14]
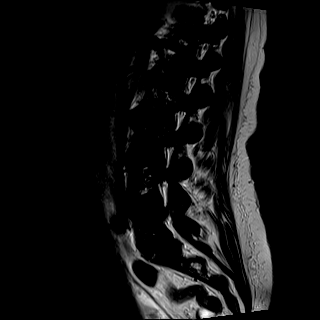
[im 9/14]
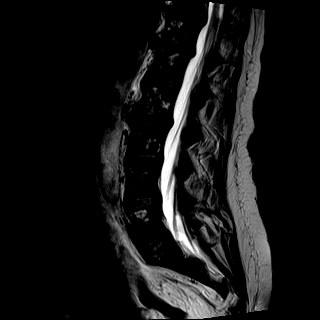
[im 14/14]
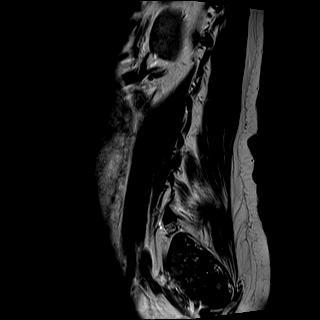

[Series 4: STIR · sagittal · 4.0mm · 0.81mm/px · 4 of 14 slices shown]
[im 1/14]
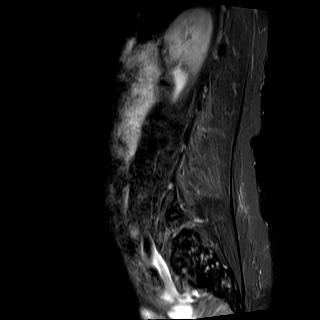
[im 5/14]
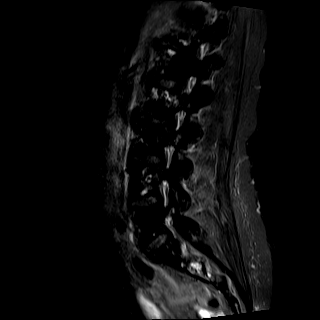
[im 9/14]
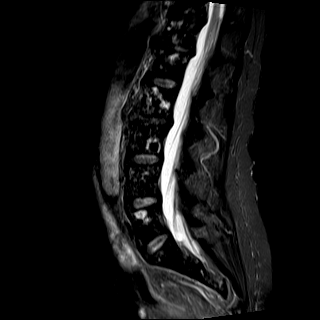
[im 14/14]
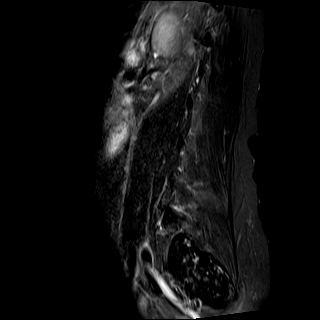

[Series 5: T1 · axial · non-contrast · 4.0mm · 0.62mm/px · z∈[-75,+134]mm · 11 of 36 slices shown (2 of 2)]
[im 1/36]
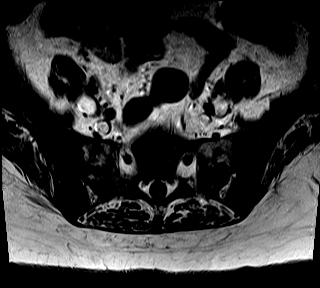
[im 4/36]
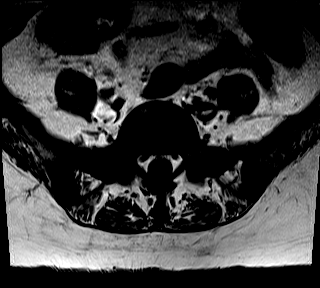
[im 8/36]
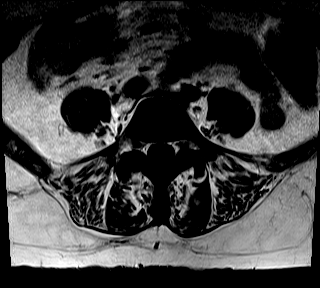
[im 11/36]
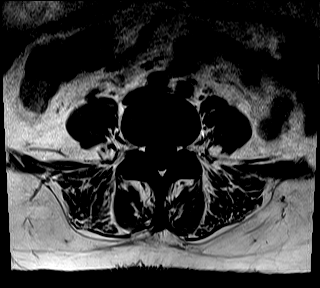
[im 15/36]
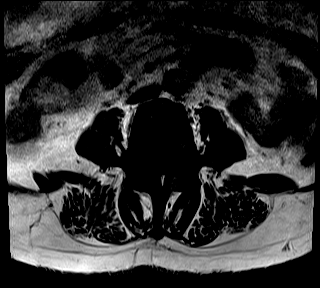
[im 18/36]
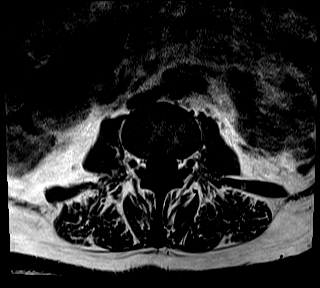
[im 22/36]
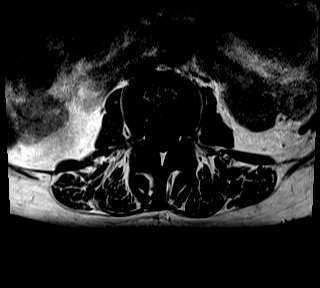
[im 25/36]
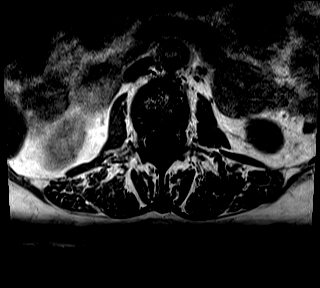
[im 29/36]
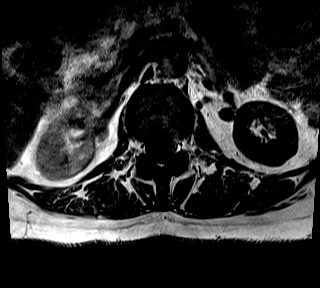
[im 32/36]
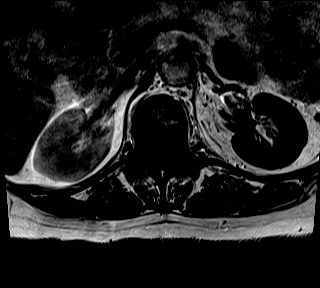
[im 36/36]
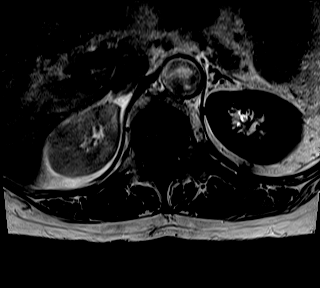

[Series 6: T2 post-contrast · axial · 4.0mm · 0.62mm/px · z∈[-75,+134]mm · 11 of 36 slices shown]
[im 1/36]
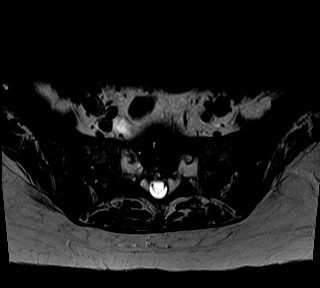
[im 4/36]
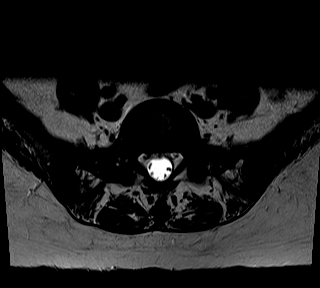
[im 8/36]
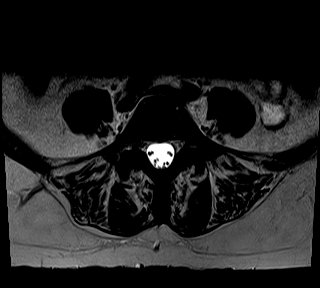
[im 11/36]
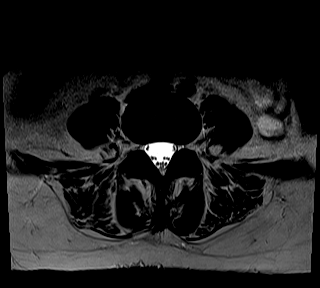
[im 15/36]
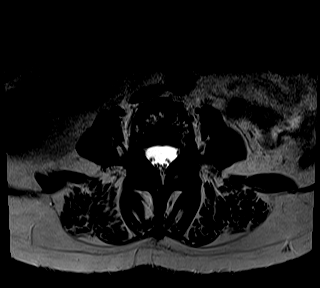
[im 18/36]
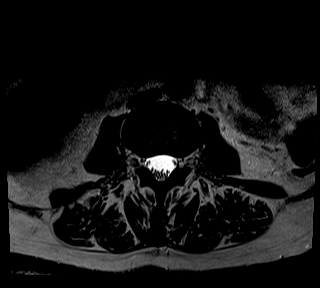
[im 22/36]
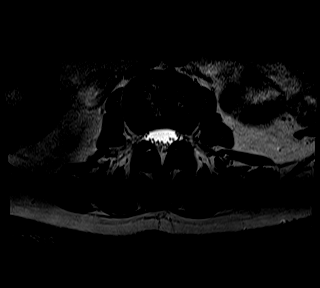
[im 25/36]
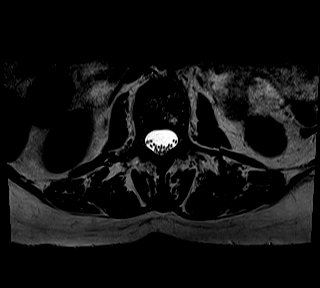
[im 29/36]
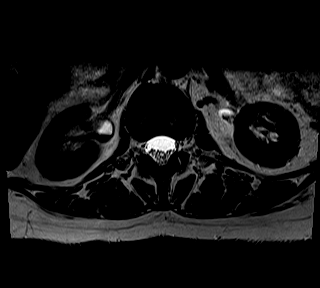
[im 32/36]
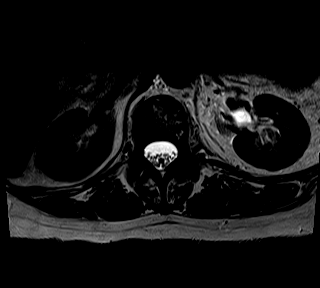
[im 36/36]
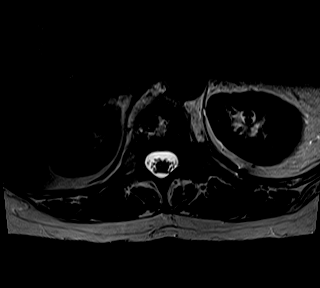

[Series 7: T1 fat-sat post-contrast · sagittal · 4.0mm · 0.81mm/px · 4 of 14 slices shown]
[im 1/14]
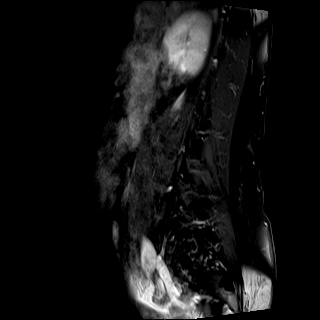
[im 5/14]
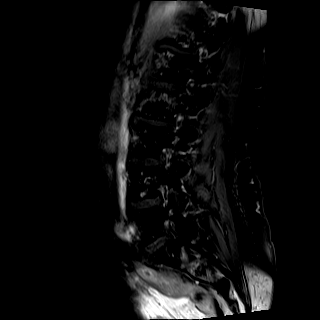
[im 9/14]
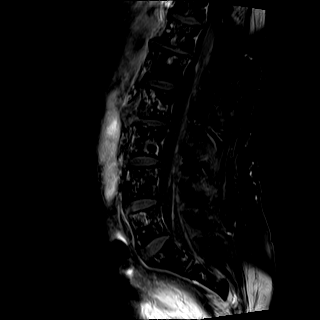
[im 14/14]
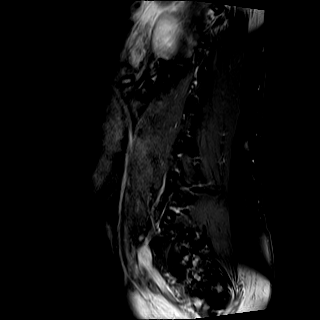

[Series 8: T1 post-contrast · axial · 4.0mm · 0.62mm/px · z∈[-75,+134]mm · 11 of 36 slices shown]
[im 1/36]
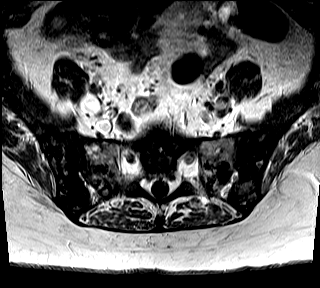
[im 4/36]
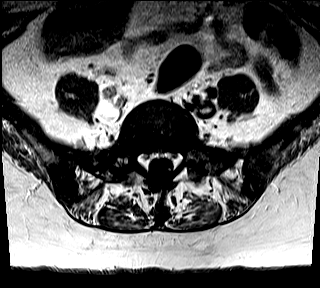
[im 8/36]
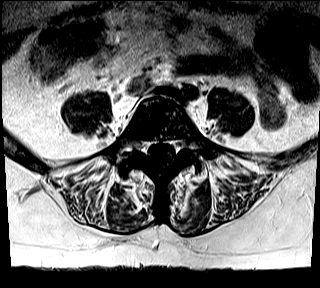
[im 11/36]
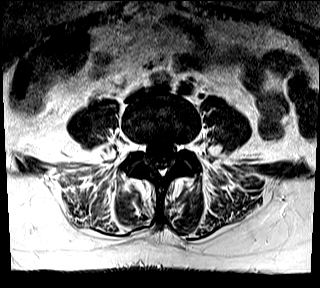
[im 15/36]
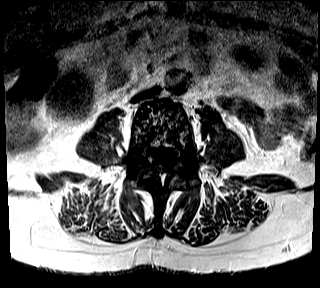
[im 18/36]
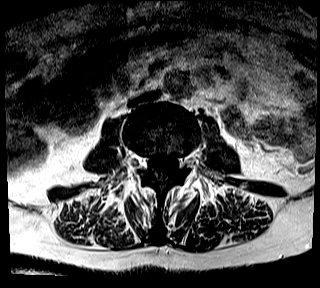
[im 22/36]
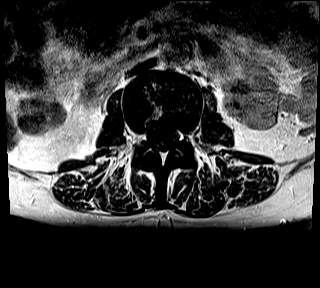
[im 25/36]
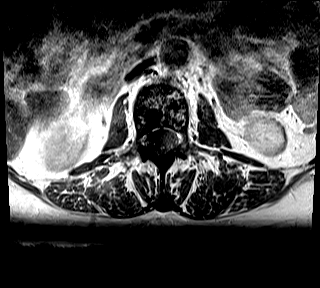
[im 29/36]
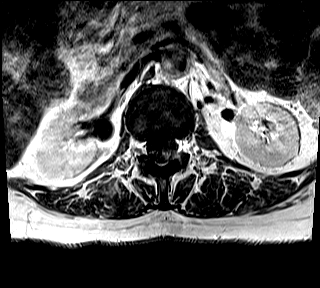
[im 32/36]
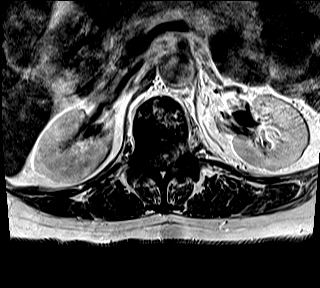
[im 36/36]
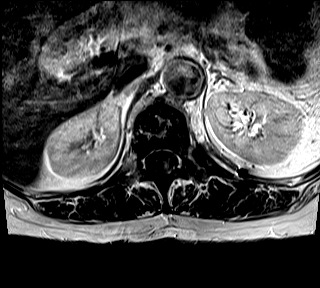

[48 of 48 positions shown; findings below may reference images not displayed]

FINDINGS: Segmentation:  5 lumbar type vertebral bodies.

Alignment:  Normal

Vertebrae: Sclerotic metastatic disease throughout the entire
region. No levels are spared. No evidence of extraosseous tumor
however. No to tumor causing canal compromise. Nodular enhancement
along the distal surface of the cord in the nerve roots likely
indicates leptomeningeal carcinomatosis.

Conus medullaris: Extends to the L1 level .

Paraspinal and other soft tissues: Negative

Disc levels:

Non-compressive disc bulges at T12-L1 and L1-2. Wide patency of the
spinal canal and foramina throughout the region. Mild lower lumbar
facet osteoarthritis.
IMPRESSION: Sclerotic metastatic disease affecting the entire regional skeleton.
No evidence of dominant lesion or extraosseous extension. No tumor
encroachment upon the canal.

Evidence of focal nodular enhancement along the distal cord and
nerve roots suggest leptomeningeal disease.

## 2018-07-01 IMAGING — DX DG ABDOMEN 2V
2 series · 2 of 2 positions shown · non-contrast
Comparison: PET scan 07/05/2016

CLINICAL DATA: Abdominal distension, nausea and constipation

EXAM:
ABDOMEN - 2 VIEW

[abdomen erect]
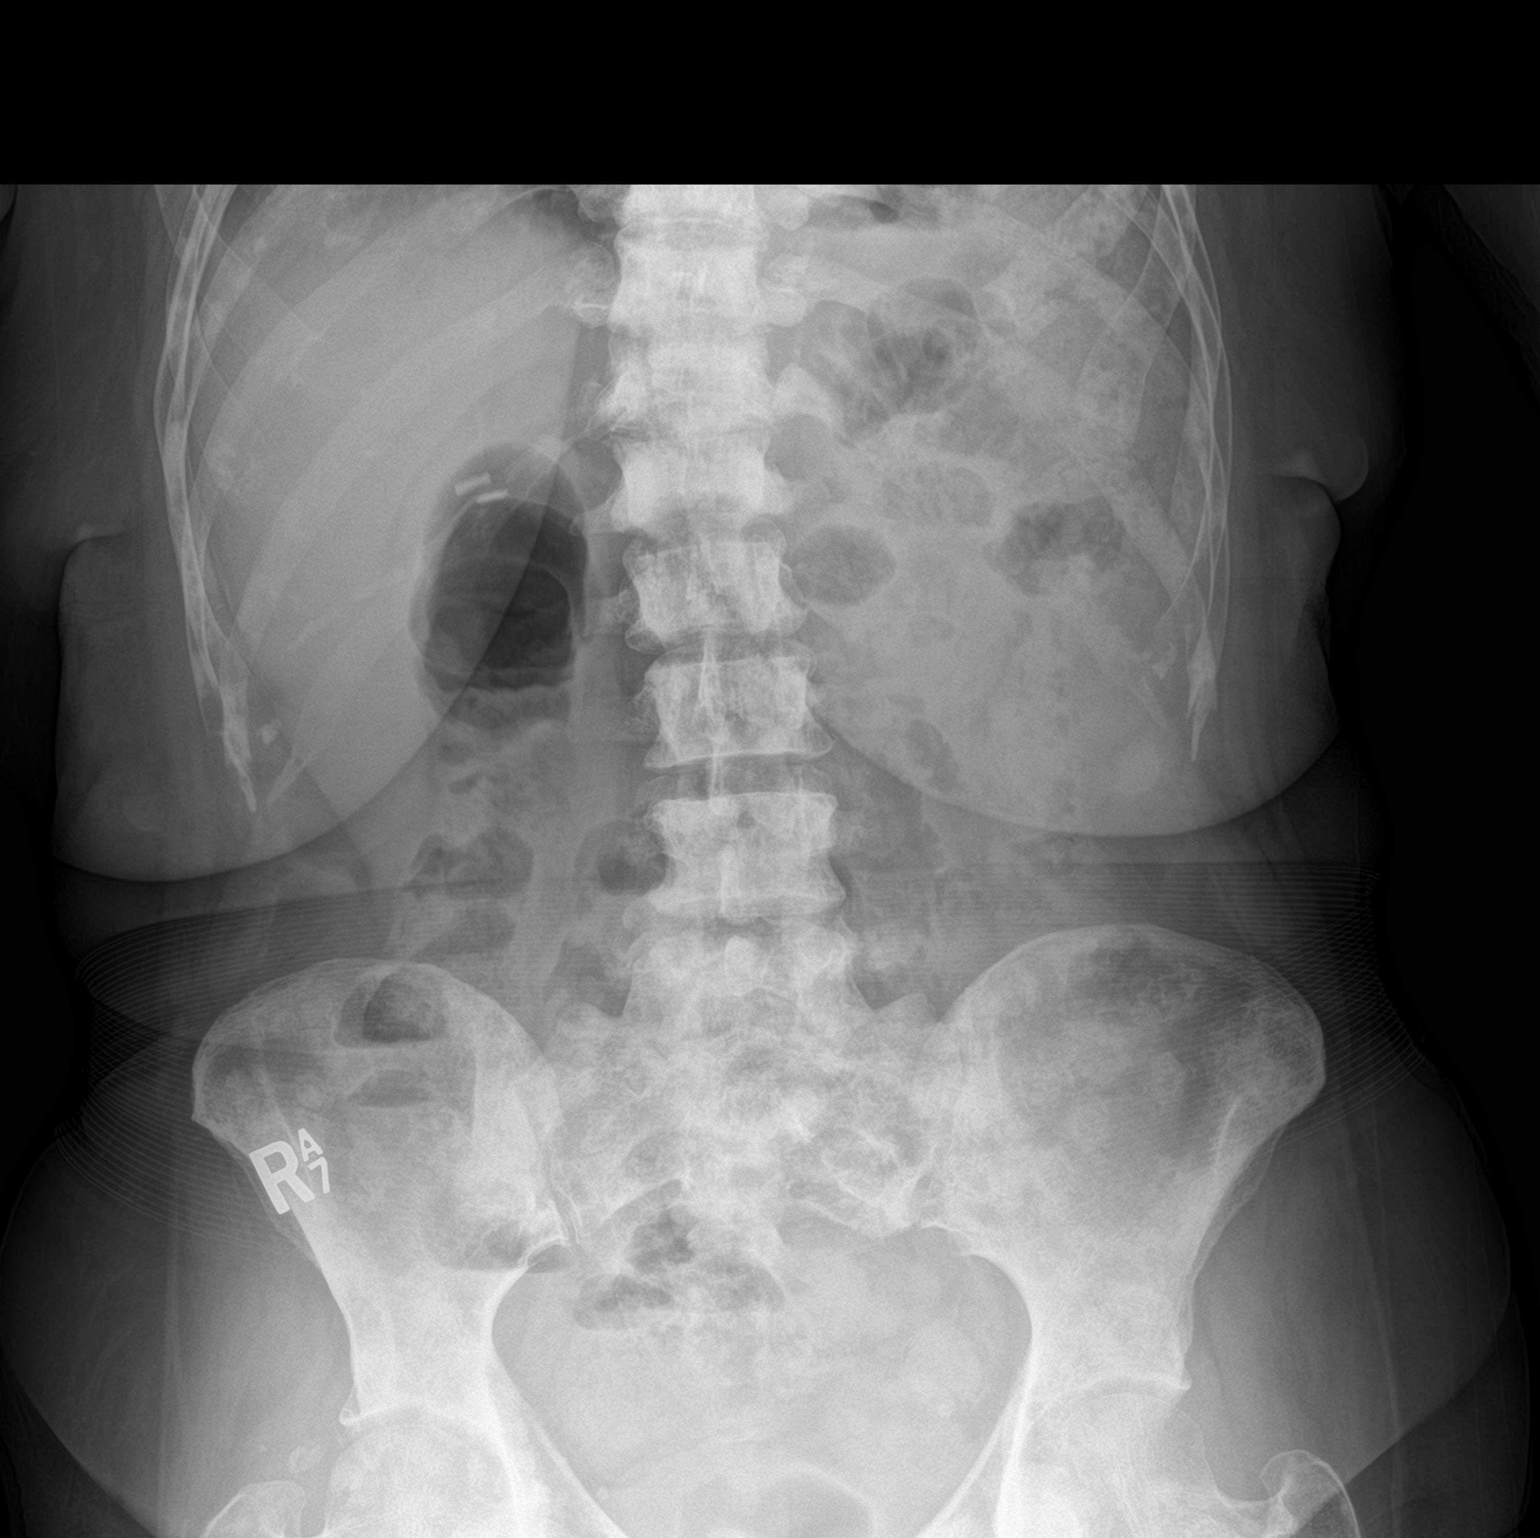

[abdomen supine]
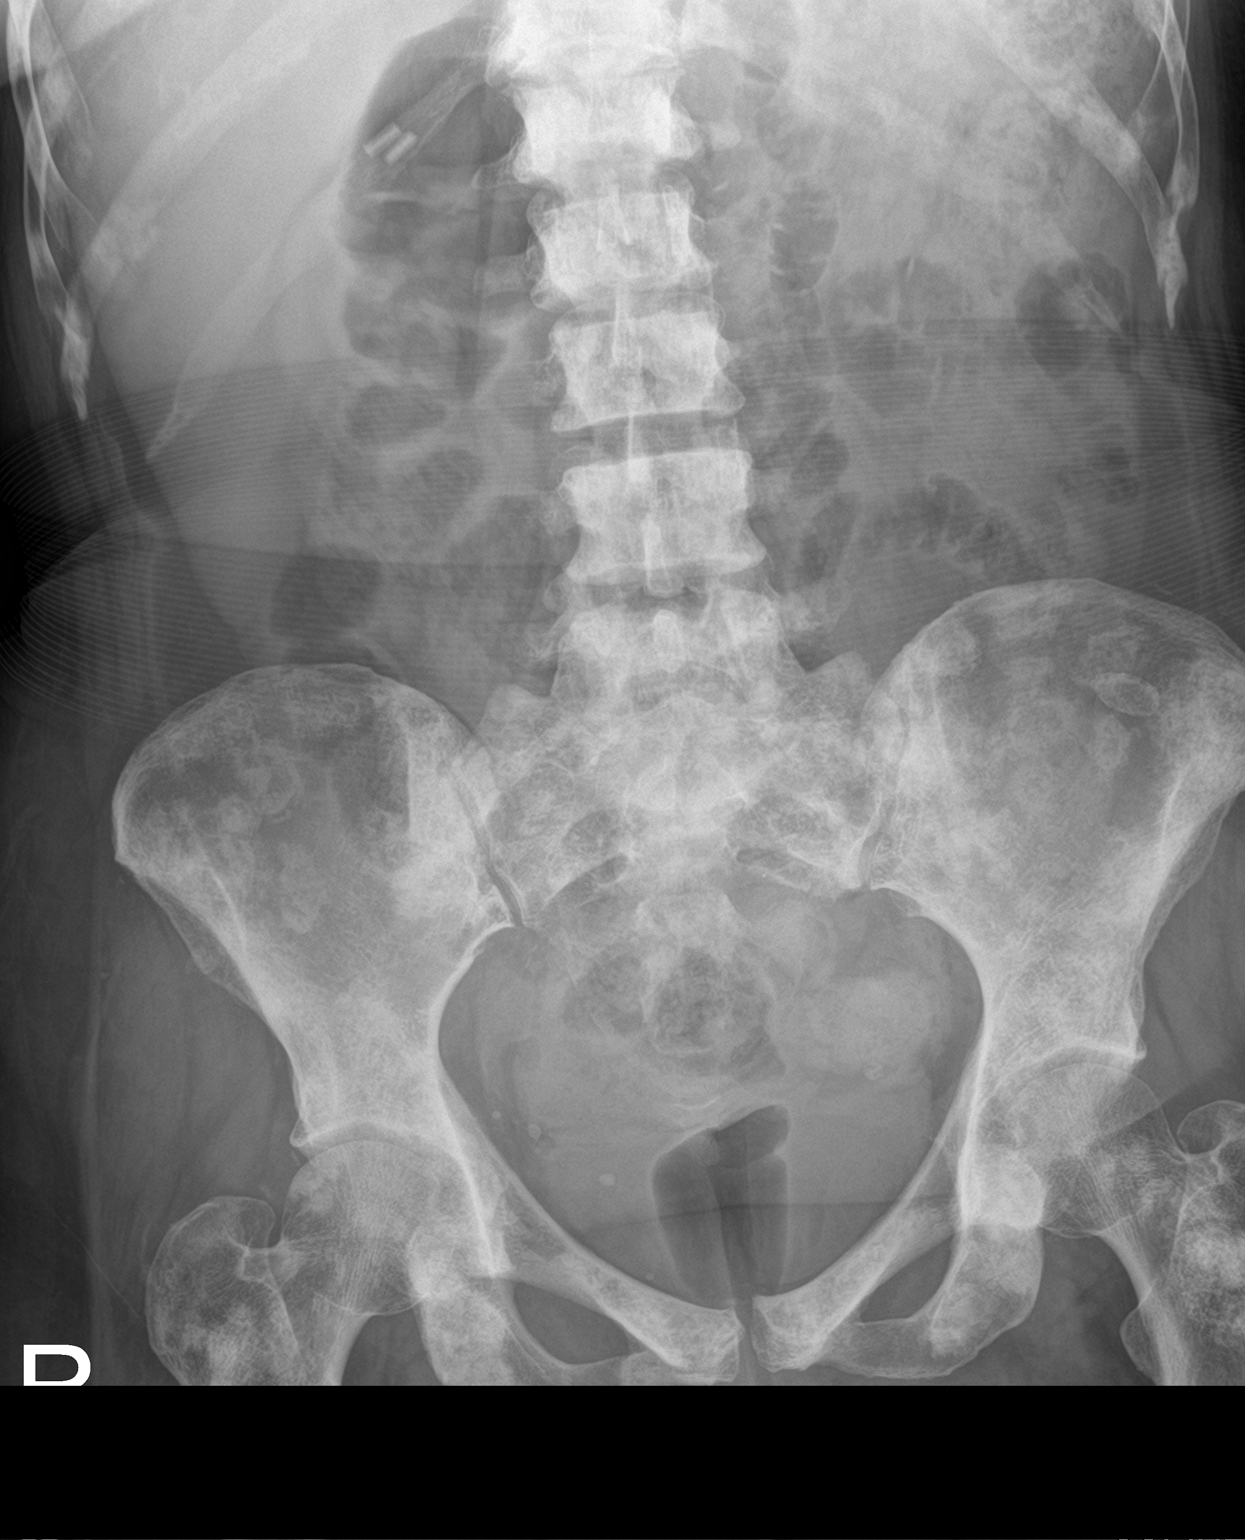

[2 of 2 positions shown; findings below may reference images not displayed]

FINDINGS: No dilated large or small bowel. Gas in the rectum. No organomegaly.
Extensive sclerotic metastasis with spine, pelvis and ribs.
IMPRESSION: 1. No evidence bowel obstruction.
2. Widespread sclerotic osseous metastasis.   No change.
